# Patient Record
Sex: Female | Born: 1960 | Race: White | Hispanic: No | Smoking: Former smoker
Health system: Southern US, Community
[De-identification: ages and names within clinical notes are randomized; demographics above are authoritative.]

## PROBLEM LIST (undated history)

## (undated) DIAGNOSIS — C801 Malignant (primary) neoplasm, unspecified: Secondary | ICD-10-CM

## (undated) DIAGNOSIS — D701 Agranulocytosis secondary to cancer chemotherapy: Secondary | ICD-10-CM

## (undated) DIAGNOSIS — Z8619 Personal history of other infectious and parasitic diseases: Secondary | ICD-10-CM

## (undated) DIAGNOSIS — B977 Papillomavirus as the cause of diseases classified elsewhere: Secondary | ICD-10-CM

## (undated) DIAGNOSIS — F32A Depression, unspecified: Secondary | ICD-10-CM

## (undated) DIAGNOSIS — F419 Anxiety disorder, unspecified: Secondary | ICD-10-CM

## (undated) DIAGNOSIS — M1990 Unspecified osteoarthritis, unspecified site: Secondary | ICD-10-CM

## (undated) DIAGNOSIS — M419 Scoliosis, unspecified: Secondary | ICD-10-CM

## (undated) DIAGNOSIS — R519 Headache, unspecified: Secondary | ICD-10-CM

## (undated) DIAGNOSIS — R51 Headache: Secondary | ICD-10-CM

## (undated) DIAGNOSIS — T451X5A Adverse effect of antineoplastic and immunosuppressive drugs, initial encounter: Secondary | ICD-10-CM

## (undated) DIAGNOSIS — A419 Sepsis, unspecified organism: Secondary | ICD-10-CM

## (undated) DIAGNOSIS — G2581 Restless legs syndrome: Secondary | ICD-10-CM

## (undated) DIAGNOSIS — C20 Malignant neoplasm of rectum: Secondary | ICD-10-CM

## (undated) DIAGNOSIS — N1330 Unspecified hydronephrosis: Secondary | ICD-10-CM

## (undated) DIAGNOSIS — F329 Major depressive disorder, single episode, unspecified: Secondary | ICD-10-CM

## (undated) DIAGNOSIS — Z87442 Personal history of urinary calculi: Secondary | ICD-10-CM

## (undated) HISTORY — DX: Unspecified osteoarthritis, unspecified site: M19.90

## (undated) HISTORY — PX: OTHER SURGICAL HISTORY: SHX169

## (undated) HISTORY — DX: Major depressive disorder, single episode, unspecified: F32.9

## (undated) HISTORY — DX: Anxiety disorder, unspecified: F41.9

## (undated) HISTORY — DX: Depression, unspecified: F32.A

## (undated) HISTORY — PX: COLONOSCOPY: SHX174

## (undated) HISTORY — PX: SHOULDER ARTHROSCOPY W/ ROTATOR CUFF REPAIR: SHX2400

## (undated) HISTORY — DX: Papillomavirus as the cause of diseases classified elsewhere: B97.7

---

## 1998-07-03 ENCOUNTER — Other Ambulatory Visit: Admission: RE | Admit: 1998-07-03 | Discharge: 1998-07-03 | Payer: Self-pay | Admitting: Obstetrics and Gynecology

## 1999-01-21 ENCOUNTER — Ambulatory Visit (HOSPITAL_COMMUNITY): Admission: RE | Admit: 1999-01-21 | Discharge: 1999-01-21 | Payer: Self-pay | Admitting: Internal Medicine

## 1999-07-10 ENCOUNTER — Other Ambulatory Visit: Admission: RE | Admit: 1999-07-10 | Discharge: 1999-07-10 | Payer: Self-pay | Admitting: Obstetrics and Gynecology

## 1999-07-11 ENCOUNTER — Ambulatory Visit (HOSPITAL_BASED_OUTPATIENT_CLINIC_OR_DEPARTMENT_OTHER): Admission: RE | Admit: 1999-07-11 | Discharge: 1999-07-11 | Payer: Self-pay | Admitting: Surgery

## 2000-11-01 ENCOUNTER — Ambulatory Visit (HOSPITAL_COMMUNITY): Admission: RE | Admit: 2000-11-01 | Discharge: 2000-11-01 | Payer: Self-pay | Admitting: Obstetrics and Gynecology

## 2000-11-01 ENCOUNTER — Encounter: Payer: Self-pay | Admitting: Obstetrics and Gynecology

## 2003-03-13 ENCOUNTER — Other Ambulatory Visit: Admission: RE | Admit: 2003-03-13 | Discharge: 2003-03-13 | Payer: Self-pay | Admitting: Obstetrics and Gynecology

## 2004-01-28 ENCOUNTER — Ambulatory Visit (HOSPITAL_COMMUNITY): Admission: RE | Admit: 2004-01-28 | Discharge: 2004-01-28 | Payer: Self-pay | Admitting: Internal Medicine

## 2004-11-17 ENCOUNTER — Other Ambulatory Visit: Admission: RE | Admit: 2004-11-17 | Discharge: 2004-11-17 | Payer: Self-pay | Admitting: Obstetrics and Gynecology

## 2006-02-03 ENCOUNTER — Encounter: Payer: Self-pay | Admitting: Internal Medicine

## 2012-05-11 ENCOUNTER — Other Ambulatory Visit: Payer: Self-pay | Admitting: Orthopedic Surgery

## 2012-05-11 DIAGNOSIS — M25511 Pain in right shoulder: Secondary | ICD-10-CM

## 2012-11-30 ENCOUNTER — Other Ambulatory Visit: Payer: Self-pay | Admitting: Obstetrics and Gynecology

## 2014-01-19 ENCOUNTER — Other Ambulatory Visit: Payer: Self-pay | Admitting: Obstetrics and Gynecology

## 2015-02-22 ENCOUNTER — Other Ambulatory Visit: Payer: Self-pay | Admitting: Obstetrics and Gynecology

## 2015-02-26 LAB — CYTOLOGY - PAP

## 2018-04-04 HISTORY — PX: COLPOSCOPY: SHX161

## 2018-04-05 ENCOUNTER — Encounter: Payer: Self-pay | Admitting: Gastroenterology

## 2018-05-02 ENCOUNTER — Ambulatory Visit: Payer: Self-pay | Admitting: Physician Assistant

## 2018-05-04 ENCOUNTER — Encounter (INDEPENDENT_AMBULATORY_CARE_PROVIDER_SITE_OTHER): Payer: Self-pay

## 2018-05-04 ENCOUNTER — Encounter: Payer: Self-pay | Admitting: Physician Assistant

## 2018-05-04 ENCOUNTER — Ambulatory Visit (INDEPENDENT_AMBULATORY_CARE_PROVIDER_SITE_OTHER): Payer: 59 | Admitting: Physician Assistant

## 2018-05-04 VITALS — BP 110/60 | HR 78 | Ht 69.0 in | Wt 181.0 lb

## 2018-05-04 DIAGNOSIS — R140 Abdominal distension (gaseous): Secondary | ICD-10-CM

## 2018-05-04 DIAGNOSIS — K625 Hemorrhage of anus and rectum: Secondary | ICD-10-CM | POA: Diagnosis not present

## 2018-05-04 MED ORDER — NA SULFATE-K SULFATE-MG SULF 17.5-3.13-1.6 GM/177ML PO SOLN: 1.0000 | ORAL | 0 refills | Status: DC

## 2018-05-04 NOTE — Progress Notes (Signed)
Chief Complaint: Rectal bleeding  HPI:    Norma Colon is a 57 year old Caucasian female with a past medical history as listed below, who presents to clinic today for complaint of rectal bleeding.    Today, explains that since April every time she has a bowel movement she will see bright red blood "around the stool and on the toilet paper".  Apparently went to see what sounds like a surgeon as referral from her gynecologist and was told it was a thrombosed hemorrhoid that was healing.  They did not give her any medicine.  Patient has continued with bright red blood with bowel movements since that time.  Typically has 2-3 bowel movements a week.  In between can have occasional bloating which feels like "my abdomen is hard".  Denies straining with stool.    Social history positive for recent divorce.       Denies fever, chills, weight loss, change in bowel habits, anorexia, nausea, vomiting, heartburn, reflux, rectal pain or abdominal pain.  Past Medical History:  Diagnosis Date  . Anxiety   . Arthritis   . Depression   . HPV in female     Past Surgical History:  Procedure Laterality Date  . COLPOSCOPY  04/04/2018  . SHOULDER ARTHROSCOPY W/ ROTATOR CUFF REPAIR Right     Current Outpatient Medications  Medication Sig Dispense Refill  . alprazolam (XANAX) 2 MG tablet Take 2 mg by mouth daily.    [name] BIOTIN PO Take 1 capsule by mouth daily.    [name] buPROPion (WELLBUTRIN XL) 300 MG 24 hr tablet Take 300 mg by mouth daily.    . Calcium-Magnesium-Vitamin D 621-30-865 MG-MG-UNIT TB24 Take 1 tablet by mouth daily.    . cyclobenzaprine (FLEXERIL) 10 MG tablet Take 10 mg by mouth 3 (three) times daily as needed for muscle spasms.    [name] FIBER PO Take by mouth 5 (five) times daily.     [name] HYDROcodone-acetaminophen (NORCO) 7.5-325 MG tablet Take 1 tablet by mouth daily as needed.    . magnesium oxide (MAG-OX) 400 MG tablet Take 400 mg by mouth daily.    [name] MAGNESIUM PO Take 1 tablet by mouth daily.    .  Prenatal Vit-Fe Fumarate-FA (PRENATAL VITAMIN PO) Take 1 tablet by mouth daily.    [name] rOPINIRole (REQUIP) 0.25 MG tablet Take 0.25 mg by mouth at bedtime.     No current facility-administered medications for this visit.     Allergies as of 05/04/2018 - Review Complete 05/04/2018  Allergen Reaction Noted  . Codeine Other (See Comments) 05/04/2018    Family History  Problem Relation Age of Onset  . COPD Father        smoker  . Stomach cancer Father        spread to lungs and bones  . Arthritis Mother   . Heart attack Paternal Grandmother   . Heart attack Paternal Grandfather   . Heart attack Maternal Grandmother   . Stroke Maternal Grandmother   . Colon cancer Neg Hx     Social History   Socioeconomic History  . Marital status: Legally Separated    Spouse name: Not on file  . Number of children: 1  . Years of education: Not on file  . Highest education level: Not on file  Occupational History  . Occupation: Land  Social Needs  . Financial resource strain: Not on file  . Food insecurity:    Worry: Not on file    Inability: Not  on file  . Transportation needs:    Medical: Not on file    Non-medical: Not on file  Tobacco Use  . Smoking status: Current Every Day Smoker    Years: 30.00    Types: Cigarettes  . Smokeless tobacco: Never Used  . Tobacco comment: tobacco info given 05/04/18  Substance and Sexual Activity  . Alcohol use: Yes    Comment: social  . Drug use: Never  . Sexual activity: Not on file  Lifestyle  . Physical activity:    Days per week: Not on file    Minutes per session: Not on file  . Stress: Not on file  Relationships  . Social connections:    Talks on phone: Not on file    Gets together: Not on file    Attends religious service: Not on file    Active member of club or organization: Not on file    Attends meetings of clubs or organizations: Not on file    Relationship status: Not on file  . Intimate partner violence:    Fear of  current or ex partner: Not on file    Emotionally abused: Not on file    Physically abused: Not on file    Forced sexual activity: Not on file  Other Topics Concern  . Not on file  Social History Narrative  . Not on file    Review of Systems:    Constitutional: No weight loss, fever or chills  Skin: No rash Cardiovascular: No chest pain Respiratory: No SOB  Gastrointestinal: See HPI and otherwise negative Genitourinary: No dysuria  Neurological: No headache, dizziness or syncope Musculoskeletal: No new muscle or joint pain Hematologic: No bruising Psychiatric: No history of depression or anxiety   Physical Exam:  Vital signs: BP 110/60   Pulse 78   Ht 5\' 9"  (1.753 m)   Wt 181 lb (82.1 kg)   BMI 26.73 kg/m   Constitutional:   Pleasant Caucasian female appears to be in NAD, Well developed, Well nourished, alert and cooperative Head:  Normocephalic and atraumatic. Eyes:   PEERL, EOMI. No icterus. Conjunctiva pink. Ears:  Normal auditory acuity. Neck:  Supple Throat: Oral cavity and pharynx without inflammation, swelling or lesion.  Respiratory: Respirations even and unlabored. Lungs clear to auscultation bilaterally.   No wheezes, crackles, or rhonchi.  Cardiovascular: Normal S1, S2. No MRG. Regular rate and rhythm. No peripheral edema, cyanosis or pallor.  Gastrointestinal:  Soft, nondistended, nontender. No rebound or guarding. Normal bowel sounds. No appreciable masses or hepatomegaly. Rectal: External: Thrombosed external hemorrhoid, noTTP; internal: Fullness, no discharge; Msk:  Symmetrical without gross deformities. Without edema, no deformity or joint abnormality.  Neurologic:  Alert and  oriented x4;  grossly normal neurologically.  Skin:   Dry and intact without significant lesions or rashes. Psychiatric: Demonstrates good judgement and reason without abnormal affect or behaviors.  No recent labs or imaging.  Assessment: 1.  Rectal bleeding: Over the past 3  months, with every bowel movement, bright red on the toilet paper and around the stool, rectal exam shows thrombosed external hemorrhoid that does not appear to have been bleeding; consider  internal hemorrhoids versus polyp versus other 2. Bloating: BM 2-3/wk is normal for patient, no straining- discussed adding in probiotic, already takes fiber supplement  Plan: 1.  Scheduled patient for a diagnostic colonoscopy in the [place] with Dr. Bryan Lemma as he had openings, though patient does not wish to follow-up in [facility] after procedure.  She would  prefer to see be seen in [place] office.  She was assigned to Dr. Fuller Plan going forward.  Did discuss risk, benefits, limitations and alternatives and patient agrees to proceed. 2.  Gave the patient Suprep. 3.  Recommend patient start a probiotic.  She is already taking fiber supplementation every day and drinking a lot of water.  This may help increase frequency of her stools and hopefully help with bloating. 4.  Patient to follow in clinic per recommendations from Dr. Bryan Lemma after time of procedure.  Ellouise Newer, PA-C [facility] 05/04/2018, 3:13 PM  Cc: No ref. provider found

## 2018-05-04 NOTE — Patient Instructions (Signed)

## 2018-05-04 NOTE — Progress Notes (Signed)
Reviewed and agree with management plan.  Malcolm T. Stark, MD FACG 

## 2018-05-05 HISTORY — PX: MRI: SHX5353

## 2018-05-11 ENCOUNTER — Encounter: Payer: Self-pay | Admitting: Gastroenterology

## 2018-05-17 ENCOUNTER — Other Ambulatory Visit: Payer: Self-pay | Admitting: Emergency Medicine

## 2018-05-17 ENCOUNTER — Encounter: Payer: Self-pay | Admitting: Gastroenterology

## 2018-05-17 ENCOUNTER — Ambulatory Visit (AMBULATORY_SURGERY_CENTER): Payer: 59 | Admitting: Gastroenterology

## 2018-05-17 ENCOUNTER — Other Ambulatory Visit: Payer: 59

## 2018-05-17 VITALS — BP 105/68 | HR 81 | Temp 98.7°F | Resp 12 | Ht 69.0 in | Wt 181.0 lb

## 2018-05-17 DIAGNOSIS — C19 Malignant neoplasm of rectosigmoid junction: Secondary | ICD-10-CM

## 2018-05-17 DIAGNOSIS — K625 Hemorrhage of anus and rectum: Secondary | ICD-10-CM | POA: Diagnosis not present

## 2018-05-17 DIAGNOSIS — K6289 Other specified diseases of anus and rectum: Secondary | ICD-10-CM

## 2018-05-17 DIAGNOSIS — C20 Malignant neoplasm of rectum: Secondary | ICD-10-CM

## 2018-05-17 DIAGNOSIS — R140 Abdominal distension (gaseous): Secondary | ICD-10-CM | POA: Diagnosis not present

## 2018-05-17 MED ORDER — SODIUM CHLORIDE 0.9 % IV SOLN: 500.0000 mL | Freq: Once | INTRAVENOUS | Status: DC

## 2018-05-17 MED ORDER — PEG-KCL-NACL-NASULF-NA ASC-C 140 G PO SOLR: 1.0000 | ORAL | 0 refills | Status: DC

## 2018-05-17 NOTE — Progress Notes (Signed)
Pt's states no medical or surgical changes since previsit or office visit. 

## 2018-05-17 NOTE — Patient Instructions (Signed)

## 2018-05-17 NOTE — Progress Notes (Signed)
Called to room to assist during endoscopic procedure.  Patient ID and intended procedure confirmed with present staff. Received instructions for my participation in the procedure from the performing physician.  Called [facility] at [phone] for pick up

## 2018-05-17 NOTE — Op Note (Signed)
[facility] Patient Name: Devon Kingdon Procedure Date: 05/17/2018 3:23 PM MRN: [id number] Endoscopist: Gerrit Heck , MD Age: 57 Referring MD:  Date of Birth: [birth year] Gender: Female Account #: [id number] Procedure:                Colonoscopy Indications:              57 yo female presenting to the GI Clinic with                            hematochezia described as blood coating the outside                            of the stool. This is the patient's first                            colonoscopy. No known family history of colon                            cancer. Medicines:                Monitored Anesthesia Care Procedure:                Pre-Anesthesia Assessment:                           - Prior to the procedure, a History and Physical                            was performed, and patient medications and                            allergies were reviewed. The patient's tolerance of                            previous anesthesia was also reviewed. The risks                            and benefits of the procedure and the sedation                            options and risks were discussed with the patient.                            All questions were answered, and informed consent                            was obtained. Prior Anticoagulants: The patient has                            taken no previous anticoagulant or antiplatelet                            agents. ASA Grade Assessment: II - A patient with  mild systemic disease. After reviewing the risks                            and benefits, the patient was deemed in                            satisfactory condition to undergo the procedure.                           After obtaining informed consent, the colonoscope                            was passed under direct vision. Throughout the                            procedure, the patient's blood pressure, pulse, and           oxygen saturations were monitored continuously. The                            Colonoscope was introduced through the anus and                            advanced to the the terminal ileum. The colonoscopy                            was technically difficult and complex due to poor                            bowel prep. Successful completion of the procedure                            was aided by lavage. The patient tolerated the                            procedure well. The quality of the bowel                            preparation was poor. Scope In: 3:30:54 PM Scope Out: 3:52:00 PM Scope Withdrawal Time: 0 hours 13 minutes 1 second  Total Procedure Duration: 0 hours 21 minutes 6 seconds  Findings:                 The perianal and digital rectal examinations were                            normal.                           A fungating and ulcerated partially obstructing                            large mass was found in the recto-sigmoid colon.  The mass was partially circumferential (involving                            at least two-thirds of the lumen circumference).                            The mass measured seven cm in length, starting at                            10 cm from the anal verge. Oozing was present.                            Several mucosal biopsies were obtained with a cold                            forceps for histology. Estimated blood loss was                            minimal.                           A moderate amount of semi-liquid stool was found in                            the entire colon, interfering with visualization.                            Lavage of the area was performed using copious                            amounts of sterile water, resulting in clearance                            with fair visualization. Cannot rule out the                            presence of polyps or flat lesions in these areas.                            Retroflexion in the rectum was not performed due to                            anatomy (narrowed rectal vault).                           The terminal ileum appeared normal. Complications:            No immediate complications. Estimated Blood Loss:     Estimated blood loss was minimal. Impression:               - Preparation of the colon was poor.                           - Large, malignant, partially obstructing tumor in  the recto-sigmoid colon, located 10-17 cm from the                            anal verge. Biopsied.                           - Stool in the entire examined colon.                           - The examined portion of the ileum was normal. Recommendation:           - Patient has a contact number available for                            emergencies. The signs and symptoms of potential                            delayed complications were discussed with the                            patient. Return to normal activities tomorrow.                            Written discharge instructions were provided to the                            patient.                           - Resume previous diet today.                           - Continue present medications.                           - Await pathology results.                           - Repeat colonoscopy at the next available                            appointment with me because the bowel preparation                            was poor and the need to rule out synchronous                            lesions. Will also plan on tattoo of the lesion at                            that repeat colonsocopy.                           - Perform a CT scan (computed tomography) of chest  with contrast, abdomen with contrast and pelvis                            with contrast at appointment to be scheduled.                           - Refer to a colo-rectal  surgeon at appointment to                            be scheduled.                           - Check CEA today. Gerrit Heck, MD 05/17/2018 4:12:24 PM

## 2018-05-17 NOTE — Progress Notes (Signed)
Spontaneous respirations throughout. VSS. Resting comfortably. To PACU on room air. Report to  RN. 

## 2018-05-18 ENCOUNTER — Telehealth: Payer: Self-pay

## 2018-05-18 LAB — CEA: CEA: 1.3 ng/mL

## 2018-05-18 NOTE — Telephone Encounter (Signed)
  Follow up Call-  Call back number 05/17/2018  Post procedure Call Back phone  # HIPPA  Permission to leave phone message Yes  Some recent data might be hidden     Patient questions:  Do you have a fever, pain , or abdominal swelling? No. Pain Score  0 *  Have you tolerated food without any problems? Yes.    Have you been able to return to your normal activities? Yes.    Do you have any questions about your discharge instructions: Diet   No. Medications  No. Follow up visit  No.  Do you have questions or concerns about your Care? No.  Actions: * If pain score is 4 or above: No action needed, pain <4.

## 2018-05-19 ENCOUNTER — Encounter: Payer: Self-pay | Admitting: Gastroenterology

## 2018-05-19 ENCOUNTER — Telehealth: Payer: Self-pay

## 2018-05-19 ENCOUNTER — Ambulatory Visit (AMBULATORY_SURGERY_CENTER): Payer: 59 | Admitting: Gastroenterology

## 2018-05-19 ENCOUNTER — Other Ambulatory Visit: Payer: Self-pay

## 2018-05-19 ENCOUNTER — Other Ambulatory Visit: Payer: 59

## 2018-05-19 VITALS — BP 117/77 | HR 79 | Temp 98.6°F | Resp 21 | Ht 69.0 in | Wt 181.0 lb

## 2018-05-19 DIAGNOSIS — K621 Rectal polyp: Secondary | ICD-10-CM

## 2018-05-19 DIAGNOSIS — D127 Benign neoplasm of rectosigmoid junction: Secondary | ICD-10-CM

## 2018-05-19 DIAGNOSIS — K629 Disease of anus and rectum, unspecified: Secondary | ICD-10-CM

## 2018-05-19 DIAGNOSIS — C189 Malignant neoplasm of colon, unspecified: Secondary | ICD-10-CM

## 2018-05-19 DIAGNOSIS — K6289 Other specified diseases of anus and rectum: Secondary | ICD-10-CM

## 2018-05-19 DIAGNOSIS — C20 Malignant neoplasm of rectum: Secondary | ICD-10-CM

## 2018-05-19 DIAGNOSIS — D128 Benign neoplasm of rectum: Secondary | ICD-10-CM

## 2018-05-19 MED ORDER — SODIUM CHLORIDE 0.9 % IV SOLN: 500.0000 mL | Freq: Once | INTRAVENOUS | Status: DC

## 2018-05-19 NOTE — Telephone Encounter (Signed)
Chest CT with contrast was overlooked at the ordering of the Abd/Pelvis but was ordered today and will be done at the same time as the other CT's.  Lattie Haw at [facility] will contact the patient tomorrow with update arrival time.

## 2018-05-19 NOTE — Progress Notes (Signed)
Report to PACU, RN, vss, BBS= Clear.  

## 2018-05-19 NOTE — Progress Notes (Signed)
Pt's states no medical or surgical changes since previsit or office visit. 

## 2018-05-19 NOTE — Progress Notes (Signed)
Called to room to assist during endoscopic procedure.  Patient ID and intended procedure confirmed with present staff. Received instructions for my participation in the procedure from the performing physician.  

## 2018-05-19 NOTE — Progress Notes (Signed)
Pam from 3rd floor in to talk to pt about future appts [facility]/Recovery Room

## 2018-05-19 NOTE — Telephone Encounter (Signed)
Per the order of Dr Bryan Lemma a referral to a colo-rectal surgeon at [facility] and a referral to Oncology were both initiated.  I scheduled a follow-up appointment with Dr Bryan Lemma on 06-07-18 at 1:30 pm, patient is aware of date and time.

## 2018-05-19 NOTE — Op Note (Signed)
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 Medicines:                Monitored Anesthesia Care Procedure:                Pre-Anesthesia Assessment:                           - Prior to the procedure, a History and Physical                            was performed, and patient medications and                            allergies were reviewed. The patient's tolerance of                            previous anesthesia was also reviewed. The risks                            and benefits of the procedure and the sedation                            options and risks were discussed with the patient.                            All questions were answered, and informed consent                            was obtained. Prior Anticoagulants: The patient has                            taken no previous anticoagulant or antiplatelet  agents. ASA Grade Assessment: II - A patient with                            mild systemic disease. After reviewing the risks                            and benefits, the patient was deemed in                            satisfactory condition to undergo the procedure.                           After obtaining informed consent, the colonoscope                            was passed under direct vision. Throughout the                             procedure, the patient's blood pressure, pulse, and                            oxygen saturations were monitored continuously. The                            Model CF-HQ190L [id number]) scope was introduced                            through the anus and advanced to the the terminal                            ileum. The colonoscopy was performed without                            difficulty. The patient tolerated the procedure                            well. The quality of the bowel preparation was good. Scope In: 1:59:14 PM Scope Out: 2:19:20 PM Scope Withdrawal Time: 0 hours 15 minutes 22 seconds  Total Procedure Duration: 0 hours 20 minutes 6 seconds  Findings:                 Skin tags were found on perianal exam.                           A fungating and ulcerated partially obstructing                            large mass was found in the recto-sigmoid colon.                            The mass was partially circumferential (involving                            two-thirds of the lumen circumference). The  mass                            measured seven cm in length, located 10-17 cm from                            the anal verge. Oozing was present. The area                            proximal to the mass was tattooed approximately 3                            cm proximal to the lesion, and the area 1-2 cm                            distal to the lesion was also tattooed. A total of                            15 mL of Spot (carbon black) was injected. Tattoo                            placed on mesenteric and antimesenteric sides. As                            this was previously biopsied and confirmation of                            Adenocarcinoma, additional biopsies not obtained                            today.                           A few sessile polyps were found in the rectum                            (benign-appearing lesion). The polyps were 1 to 2                             mm in size. Several of these polyps were removed                            with a cold biopsy forceps for histologic                            representative evaluation. Resection and retrieval                            were complete. Estimated blood loss was minimal.                           The exam was otherwise without abnormality.  The terminal ileum appeared normal. Complications:            No immediate complications. Estimated Blood Loss:     Estimated blood loss was minimal. Impression:               - Perianal skin tags found on perianal exam.                           - Malignant partially obstructing tumor in the                            recto-sigmoid colon. Tattooed.                           - A few benign appearing 1 to 2 mm polyps in the                            rectum, removed with a cold biopsy forceps.                            Resected and retrieved.                           - The examination was otherwise normal.                           - The examined portion of the ileum was normal. Recommendation:           - Patient has a contact number available for                            emergencies. The signs and symptoms of potential                            delayed complications were discussed with the                            patient. Return to normal activities tomorrow.                            Written discharge instructions were provided to the                            patient.                           - Resume previous diet today.                           - Continue present medications.                           - Await pathology results.                           - Refer to a colo-rectal surgeon at appointment to  be scheduled.                           - Refer to an oncologist at appointment to be                            scheduled.                           -  Perform a CT scan (computed tomography) of chest                            with contrast, abdomen with contrast and pelvis                            with contrast as previously scheduled (already                            ordered).                           - Return to GI clinic in 1-2 weeks. Gerrit Heck, MD 05/19/2018 2:32:45 PM

## 2018-05-19 NOTE — Patient Instructions (Signed)
appts to be made by 3rd floor staff. That nurse will be calling you concerning CT scan, etc. 2 bottles of contrast given to patient with blank instructions.   YOU HAD AN ENDOSCOPIC PROCEDURE TODAY AT [facility]:   Refer to the procedure report that was given to you for any specific questions about what was found during the examination.  If the procedure report does not answer your questions, please call your gastroenterologist to clarify.  If you requested that your care partner not be given the details of your procedure findings, then the procedure report has been included in a sealed envelope for you to review at your convenience later.  YOU SHOULD EXPECT: Some feelings of bloating in the abdomen. Passage of more gas than usual.  Walking can help get rid of the air that was put into your GI tract during the procedure and reduce the bloating. If you had a lower endoscopy (such as a colonoscopy or flexible sigmoidoscopy) you may notice spotting of blood in your stool or on the toilet paper. If you underwent a bowel prep for your procedure, you may not have a normal bowel movement for a few days.  Please Note:  You might notice some irritation and congestion in your nose or some drainage.  This is from the oxygen used during your procedure.  There is no need for concern and it should clear up in a day or so.  SYMPTOMS TO REPORT IMMEDIATELY:   Following lower endoscopy (colonoscopy or flexible sigmoidoscopy):  Excessive amounts of blood in the stool  Significant tenderness or worsening of abdominal pains  Swelling of the abdomen that is new, acute  Fever of 100F or higher  For urgent or emergent issues, a gastroenterologist can be reached at any hour by calling [phone].   DIET:  We do recommend a small meal at first, but then you may proceed to your regular diet.  Drink plenty of fluids but you should avoid alcoholic beverages for 24 hours.  ACTIVITY:  You should plan  to take it easy for the rest of today and you should NOT DRIVE or use heavy machinery until tomorrow (because of the sedation medicines used during the test).    FOLLOW UP: Our staff will call the number listed on your records the next business day following your procedure to check on you and address any questions or concerns that you may have regarding the information given to you following your procedure. If we do not reach you, we will leave a message.  However, if you are feeling well and you are not experiencing any problems, there is no need to return our call.  We will assume that you have returned to your regular daily activities without incident.  If any biopsies were taken you will be contacted by phone or by letter within the next 1-3 weeks.  Please call us at [phone] if you have not heard about the biopsies in 3 weeks.    SIGNATURES/CONFIDENTIALITY: You and/or your care partner have signed paperwork which will be entered into your electronic medical record.  These signatures attest to the fact that that the information above on your After Visit Summary has been reviewed and is understood.  Full responsibility of the confidentiality of this discharge information lies with you and/or your care-partner.

## 2018-05-20 ENCOUNTER — Encounter: Payer: Self-pay | Admitting: Oncology

## 2018-05-20 ENCOUNTER — Telehealth: Payer: Self-pay

## 2018-05-20 ENCOUNTER — Telehealth: Payer: Self-pay | Admitting: Oncology

## 2018-05-20 NOTE — Telephone Encounter (Signed)
-----   Message from Arna Snipe, RN sent at 05/20/2018 11:57 AM EDT ----- Regarding: RE: New diagnosis Hi Pam -  I added this patient to the GI Cancer Conference next week and am working on getting her in with Dr. Burr Medico. Did you referrer her to [facility]? I can follow up with them to get her in quickly if that referral has been sent.  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  A referral to [facility] was also started today.  Thanks.

## 2018-05-20 NOTE — Telephone Encounter (Signed)
Yes.  The referral was sent yesterday

## 2018-05-20 NOTE — Telephone Encounter (Deleted)
Please call if any questions or concerns. Follow up Call-  Call back number 05/19/2018 05/17/2018  Post procedure Call Back phone  # [phone] HIPPA  Permission to leave phone message Yes Yes  Some recent data might be hidden     Patient questions:  Do you have a fever, pain , or abdominal swelling? {yes no:314532} Pain Score  {NUMBERS; 0-10:5044} *  Have you tolerated food without any problems? {yes no:314532}  Have you been able to return to your normal activities? {yes no:314532}  Do you have any questions about your discharge instructions: Diet   {yes no:314532} Medications  {yes no:314532} Follow up visit  {yes no:314532}  Do you have questions or concerns about your Care? {yes no:314532}  Actions: * If pain score is 4 or above: {ACTION; [facility] ENDO PAIN >4:21563::"No action needed, pain <4."}

## 2018-05-20 NOTE — Telephone Encounter (Signed)
  Follow up Call-  Call back number 05/19/2018 05/17/2018  Post procedure Call Back phone  # [phone] HIPPA  Permission to leave phone message Yes Yes  Some recent data might be hidden     Patient questions:  Do you have a fever, pain , or abdominal swelling? No. Pain Score  0 *  Have you tolerated food without any problems? Yes.  Have you been able to return to your normal activities? Yes.  Do you have any questions about your discharge instructions: Diet   No. Medications  No. Follow up visit  No.  Do you have questions or concerns about your Care? No.  Actions: * If pain score is 4 or above: No action needed, pain <4.  No problems noted per pt.

## 2018-05-20 NOTE — Telephone Encounter (Signed)
Called [phone] and left a messaged we tried to reach pt for a follow up call. maw

## 2018-05-20 NOTE — Progress Notes (Signed)
Called patient to introduce myself and to explain the role of Gi Navigator. Patient provided with my contact info for questions or concerns. Appointment with Dr. Benay Spice on 05/30/18 @ 2 PM for initial consult. Patient understands to arrive at 1:30 to register. Message sent to Dr. Marcello Moores to facilitate surgical referral.

## 2018-05-20 NOTE — Telephone Encounter (Signed)
New referral received for colon cancer. Pt has been scheduled to see Dr. Benay Spice on 8/26 at [place], GI navigator will notify the pt of the appt date and time. Letter mailed.

## 2018-05-24 ENCOUNTER — Ambulatory Visit
Admission: RE | Admit: 2018-05-24 | Discharge: 2018-05-24 | Disposition: A | Discharge status: Home / Self Care | Payer: 59 | Source: Ambulatory Visit | Attending: Gastroenterology | Admitting: Gastroenterology

## 2018-05-24 ENCOUNTER — Other Ambulatory Visit (HOSPITAL_COMMUNITY): Payer: 59

## 2018-05-24 ENCOUNTER — Other Ambulatory Visit: Payer: 59

## 2018-05-24 ENCOUNTER — Inpatient Hospital Stay: Admission: RE | Admit: 2018-05-24 | Payer: 59 | Source: Ambulatory Visit

## 2018-05-24 ENCOUNTER — Other Ambulatory Visit: Payer: Self-pay | Admitting: Emergency Medicine

## 2018-05-24 ENCOUNTER — Encounter (HOSPITAL_COMMUNITY): Payer: Self-pay

## 2018-05-24 ENCOUNTER — Other Ambulatory Visit: Payer: Self-pay | Admitting: Gastroenterology

## 2018-05-24 ENCOUNTER — Ambulatory Visit (HOSPITAL_COMMUNITY): Admission: RE | Admit: 2018-05-24 | Payer: 59 | Source: Ambulatory Visit

## 2018-05-24 DIAGNOSIS — C20 Malignant neoplasm of rectum: Secondary | ICD-10-CM

## 2018-05-24 DIAGNOSIS — C189 Malignant neoplasm of colon, unspecified: Secondary | ICD-10-CM

## 2018-05-24 DIAGNOSIS — Q991 46, XX true hermaphrodite: Secondary | ICD-10-CM

## 2018-05-24 MED ORDER — IOPAMIDOL (ISOVUE-300) INJECTION 61%
100.0000 mL | Freq: Once | INTRAVENOUS | Status: AC | PRN
  Administered 2018-05-24: 100 mL via INTRAVENOUS

## 2018-05-25 ENCOUNTER — Encounter: Payer: Self-pay | Admitting: Radiation Oncology

## 2018-05-25 DIAGNOSIS — C20 Malignant neoplasm of rectum: Secondary | ICD-10-CM

## 2018-05-25 NOTE — Progress Notes (Signed)
Oncology Nurse Navigator Documentation  Patient called asking for clarification on why she had received a phone call from Dr. Manon Hilding office to set up surgical appointment. Education on who her providers will be  and their roles completed.  Patient has my contact information for further questions. Patient scheduled to see Dr. Benay Spice on 05/30/18.

## 2018-05-25 NOTE — Progress Notes (Signed)
Called and spoke with patient to advise that she will be receiving a call to schedule an appointment with Dr. Eustaquio Boyden.

## 2018-05-26 ENCOUNTER — Telehealth: Payer: Self-pay | Admitting: Gastroenterology

## 2018-05-26 NOTE — Telephone Encounter (Signed)
I called the patient to discuss the CT findings as noted. No evidence of distant mets. The rectosigmoid mass was identified and location/size correlate with recent colonoscopy findings. CT report notes several tiny perirectal lymph nodes, otherwise no distant lymphadenopathy. She is scheduled for an appointment with oncology, colorectal surgery, and radiation oncology next week. Already has a follow-up appointment scheduled with me on September 3. All questions answered. Will defer whether or not to do MRI pelvis to colorectal surgery and oncology.

## 2018-05-26 NOTE — Progress Notes (Signed)
GI Location of Tumor / Histology: Adenocarcinoma of the rectum  Norma Colon presented with rectal bleeding since April.  Bleeding occurs every time she has a bowel movement she will see bright red blood " around the stool and on the toilet paper ".  She was seen by GYN and was told it was a healing thrombosed hemorrhoid.  Patient continued to have bright red blood with bowel movements since then.  Colonoscopy 8/13, 8/15 2019: colon mass  MRI Pelvic 06/01/2018:  CT chest 05/24/2018: 1. Circumferential mass involving the rectosigmoid colon is again noted. This has a length of approximately 9 cm and terminates approximately 7.6 cm from the anal verge. 2. No findings identified to suggest metastatic disease.  CT CAP 05/24/2018: 1. Circumferential mass involving the rectosigmoid colon is again noted. This has a length of approximately 9 cm and terminates approximately 7.6 cm from the anal verge. 2. No findings identified to suggest metastatic disease. 3. Kidney stone identified at the right UPJ is identified without significant hydronephrosis.  Biopsies of Rectum 05/17/2018    Past/Anticipated interventions by GI, if any: PA Lemmon 05/04/2018 - Scheduled patient for a diagnostic colonoscopy in the [place] with Dr. Bryan Lemma as he had openings, though patient does not wish to follow-up in [place] after procedure.  Prefers to be seen in [place]. -Recommended the patient start a probiotic.  Past/Anticipated interventions by surgeon, if any:    Past/Anticipated interventions by medical oncology, if any: Dr. Benay Spice 05/30/2018 2pm -   I recommend neoadjuvant capecitabine/radiation if she is confirmed to have stage II or early stage III disease -  I will coordinate the chemotherapy/radiation plan with Dr. Lisbeth Renshaw. - Her case will be presented at the GI tumor conference on 06/01/2018.   - We will proceed with neoadjuvant therapy if Dr. Marcello Moores is in agreement. - [name] will begin capecitabine and  radiation during the week of 06/13/2018.  She will return for an office and lab visit on 06/22/2018.  Weight changes, if any: No  Bowel/Bladder complaints, if any: Feeling bloated, No issues noted  Nausea / Vomiting, if any: No  Pain issues, if any:  No  Any blood per rectum: none noted    BP 131/80 (BP Location: Left Arm, Patient Position: Sitting)   Pulse 83   Temp 97.8 F (36.6 C) (Oral)   Resp 18   Ht 5\' 9"  (1.753 m)   Wt 180 lb 6.4 oz (81.8 kg)   LMP  (LMP Unknown)   SpO2 100%   BMI 26.64 kg/m    Wt Readings from Last 3 Encounters:  05/31/18 180 lb 6.4 oz (81.8 kg)  05/30/18 180 lb 9.6 oz (81.9 kg)  05/19/18 181 lb (82.1 kg)    SAFETY ISSUES:  Prior radiation? No  Pacemaker/ICD? No  Possible current pregnancy? No  Is the patient on methotrexate? No  Current Complaints/Details:

## 2018-05-30 ENCOUNTER — Telehealth: Payer: Self-pay | Admitting: Oncology

## 2018-05-30 ENCOUNTER — Encounter: Payer: Self-pay | Admitting: Radiation Oncology

## 2018-05-30 ENCOUNTER — Encounter: Payer: Self-pay | Admitting: Oncology

## 2018-05-30 ENCOUNTER — Inpatient Hospital Stay: Payer: 59 | Attending: Oncology | Admitting: Oncology

## 2018-05-30 ENCOUNTER — Other Ambulatory Visit: Payer: Self-pay | Admitting: Radiation Oncology

## 2018-05-30 ENCOUNTER — Telehealth: Payer: Self-pay | Admitting: Pharmacist

## 2018-05-30 VITALS — BP 120/78 | HR 88 | Temp 98.2°F | Resp 18 | Ht 69.0 in | Wt 180.6 lb

## 2018-05-30 DIAGNOSIS — N201 Calculus of ureter: Secondary | ICD-10-CM

## 2018-05-30 DIAGNOSIS — Z720 Tobacco use: Secondary | ICD-10-CM

## 2018-05-30 DIAGNOSIS — G43909 Migraine, unspecified, not intractable, without status migrainosus: Secondary | ICD-10-CM

## 2018-05-30 DIAGNOSIS — C20 Malignant neoplasm of rectum: Secondary | ICD-10-CM

## 2018-05-30 DIAGNOSIS — G2581 Restless legs syndrome: Secondary | ICD-10-CM | POA: Diagnosis not present

## 2018-05-30 MED ORDER — CAPECITABINE 500 MG PO TABS: ORAL_TABLET | ORAL | 0 refills | Status: DC

## 2018-05-30 NOTE — Telephone Encounter (Signed)
Appts scheduled AVS/Calendar printed per 8/26 los °

## 2018-05-30 NOTE — Progress Notes (Signed)
[place] New Patient Consult   Requesting MD: Maicee Ullman 57 y.o.  Nov 07, 1960    Reason for Consult: Rectal cancer   HPI: Norma Colon reports bleeding with bowel movements beginning in February of this year.  She saw her gynecologist and was referred for a rectal evaluation.  She reports being diagnosed with a thrombosed hemorrhoid.  The bleeding persisted and she was referred to gastroenterology 05/04/2018.  She was taken to a colonoscopy by Dr. Bryan Lemma on 05/17/2018.  A fungating partially obstructing mass was found beginning at 10 cm from the anal verge.  Oozing was present.  Biopsies were obtained.  A large amount of stool was found in the colon interfering with visualization.  The biopsy from the rectum ([id number] (revealed invasive moderately differentiated adenocarcinoma. She was taken to a repeat colonoscopy 05/19/2018.  The rectal mass was tattooed.  Sessile polyps measuring 1-2 mm were removed from the rectum.  The pathology on the rectal polyps ([id number] (revealed hyperplastic polyps.  She was referred for CTs of the chest, abdomen, and pelvis on 05/24/2018.  No evidence of metastatic disease to the chest.  No suspicious liver abnormality.  A stone was noted at the right UPJ measuring 5 mm.  Circumferential lesion beginning at the rectosigmoid colon and terminating approximately 7.6 cm from the anal verge.  Several tiny perirectal lymph nodes.  Changes of emphysema in the lungs.  Past Medical History:  Diagnosis Date  . Anxiety   . Arthritis   . Depression   . HPV in female     .  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  Restless legs  Past Surgical History:  Procedure Laterality Date  . COLPOSCOPY  04/04/2018  . SHOULDER ARTHROSCOPY W/ ROTATOR CUFF REPAIR Right     Medications: Reviewed  Allergies:  Allergies  Allergen Reactions  . Codeine Other (See Comments)    Causes migraine    Family history: Her father died of lung cancer at age 66 and was a smoker.   No other family history of cancer  Social History:   He lives alone in [place].  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  She has smoked 1 pack of cigarettes per day for approximately the past 35 years.  She reports social alcohol use.  No transfusion history.  No risk factor for HIV or hepatitis.  ROS:   Positives include: Bleeding with bowel movements, "restless legs "at night  A complete ROS was otherwise negative.  Physical Exam:  Blood pressure 120/78, pulse 88, temperature 98.2 F (36.8 C), temperature source Oral, resp. rate 18, height 5\' 9"  (1.753 m), weight 180 lb 9.6 oz (81.9 kg), SpO2 100 %.  HEENT: Oropharynx without visible mass, neck without mass Lungs: Distant breath sounds with inspiratory bronchial sounds at the left upper posterior chest, no respiratory distress Cardiac: Regular rate and rhythm Abdomen: No hepatosplenomegaly, nontender, no mass  Vascular: No leg edema Lymph nodes: Pea-sized left scalene node.  No cervical, supraclavicular, axillary, or inguinal nodes Neurologic: Alert and oriented, the motor exam appears intact in the upper and lower extremities Skin: No rash, 1 cm area of erythema and induration in the right axilla Musculoskeletal: Spine tenderness   LAB: CEA on 05/17/2018: 1.3    Imaging:  As per HPI, CT images from 05/24/2018 reviewed with Norma Colon   Assessment/Plan:   1. Rectal cancer  Colonoscopy 05/17/2018 revealed a partially obstructing mass beginning at 10 cm from the anal verge, biopsy confirmed  invasive moderately differentiated adenocarcinoma  CTs 05/24/2018-mass beginning at proximal he 7.6 cm from the anal verge, "20 "perirectal lymph nodes, no evidence of metastatic disease 2. Tobacco use 3. 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 Migraine headaches 5. Restless legs   Disposition:   Norma Colon has been diagnosed with rectal cancer.  She appears to have localized disease based on the staging evaluation to date.  She  will be referred for a staging pelvic MRI.  I recommend neoadjuvant capecitabine/radiation if she is confirmed to have stage II or early stage III disease.  Norma Colon is scheduled to see Dr. Lisbeth Renshaw tomorrow.  I will coordinate the chemotherapy/radiation plan with Dr. Lisbeth Renshaw.  Her case will be presented at the GI tumor conference on 06/01/2018.  We will proceed with neoadjuvant therapy if Dr. Marcello Moores is in agreement.  We reviewed potential toxicities associated with capecitabine including the chance for nausea/vomiting, mucositis, diarrhea, and hematologic toxicity.  We discussed the sun sensitivity, rash, hyperpigmentation, and hand/foot syndrome associated with capecitabine.  We discussed the potential for skin toxicity at the perineum with chemotherapy and radiation.  She understands this may be worsened while smoking.  I recommended she discontinue smoking.  Norma Colon will begin capecitabine and radiation during the week of 06/13/2018.  She will return for an office and lab visit on 06/22/2018.    Betsy Coder, MD  05/30/2018, 1:55 PM

## 2018-05-30 NOTE — Telephone Encounter (Signed)
MRI has been authorized per 8/26 staff message

## 2018-05-30 NOTE — Progress Notes (Signed)
Met with Jacobo Forest. Explained role of nurse navigator. Educational information provided on colorectal cancer  [place] resources provided to patient, including SW service information.   SW contact information provided to patient.  Contact names and phone numbers were provided for entire [facility] team.  Teach back method was used. Pamphlet on Pelvic Floor Rehab provided to patient. She will think about the referral and let us know. No barriers to care identified at present time.  Will continue to follow as needed

## 2018-05-30 NOTE — Telephone Encounter (Addendum)
Oral Oncology Pharmacist Encounter  Received new prescription for Xeloda (capecitabine) for the neoadjuvant treatment of stage II or stage III rectal cancer in conjunction with radiation, planned duration 5 1/2 - 6 weeks of therapy.  No recent labs to assess, will be checked tomorrow (05/31/18)  Xeloda will be initiated at 875 mg/m2 BID + radiation  Current medication list in Epic reviewed, no DDIs with Xeloda identified.  Prescription has been e-scribed to the [facility] for benefits analysis and approval.  Oral Oncology Clinic will continue to follow for insurance authorization, copayment issues, initial counseling and start date.  Johny Drilling, PharmD, BCPS, BCOP 05/30/2018 3:37 PM Oral Oncology Clinic [phone]

## 2018-05-31 ENCOUNTER — Inpatient Hospital Stay: Payer: 59

## 2018-05-31 ENCOUNTER — Ambulatory Visit
Admission: RE | Admit: 2018-05-31 | Discharge: 2018-05-31 | Disposition: A | Discharge status: Home / Self Care | Payer: 59 | Source: Ambulatory Visit | Attending: Radiation Oncology | Admitting: Radiation Oncology

## 2018-05-31 ENCOUNTER — Encounter: Payer: Self-pay | Admitting: *Deleted

## 2018-05-31 ENCOUNTER — Encounter: Payer: Self-pay | Admitting: Radiation Oncology

## 2018-05-31 ENCOUNTER — Other Ambulatory Visit: Payer: Self-pay | Admitting: Radiation Oncology

## 2018-05-31 ENCOUNTER — Other Ambulatory Visit: Payer: Self-pay

## 2018-05-31 VITALS — BP 131/80 | HR 83 | Temp 97.8°F | Resp 18 | Ht 69.0 in | Wt 180.4 lb

## 2018-05-31 DIAGNOSIS — Z885 Allergy status to narcotic agent status: Secondary | ICD-10-CM | POA: Insufficient documentation

## 2018-05-31 DIAGNOSIS — Z79899 Other long term (current) drug therapy: Secondary | ICD-10-CM | POA: Insufficient documentation

## 2018-05-31 DIAGNOSIS — C20 Malignant neoplasm of rectum: Secondary | ICD-10-CM

## 2018-05-31 DIAGNOSIS — Z006 Encounter for examination for normal comparison and control in clinical research program: Secondary | ICD-10-CM

## 2018-05-31 DIAGNOSIS — F17210 Nicotine dependence, cigarettes, uncomplicated: Secondary | ICD-10-CM | POA: Insufficient documentation

## 2018-05-31 DIAGNOSIS — M1990 Unspecified osteoarthritis, unspecified site: Secondary | ICD-10-CM | POA: Insufficient documentation

## 2018-05-31 HISTORY — DX: Scoliosis, unspecified: M41.9

## 2018-05-31 LAB — CMP (CANCER CENTER ONLY)
ALT: 17 U/L (ref 0–44)
AST: 17 U/L (ref 15–41)
Albumin: 4.1 g/dL (ref 3.5–5.0)
Alkaline Phosphatase: 114 U/L (ref 38–126)
Anion gap: 6 (ref 5–15)
BUN: 17 mg/dL (ref 6–20)
CO2: 31 mmol/L (ref 22–32)
CREATININE: 0.89 mg/dL (ref 0.44–1.00)
Calcium: 10.4 mg/dL — ABNORMAL HIGH (ref 8.9–10.3)
Chloride: 104 mmol/L (ref 98–111)
Glucose, Bld: 88 mg/dL (ref 70–99)
Potassium: 4.2 mmol/L (ref 3.5–5.1)
SODIUM: 141 mmol/L (ref 135–145)
Total Bilirubin: 0.5 mg/dL (ref 0.3–1.2)
Total Protein: 7.8 g/dL (ref 6.5–8.1)

## 2018-05-31 LAB — CBC WITH DIFFERENTIAL (CANCER CENTER ONLY)
BASOS ABS: 0 10*3/uL (ref 0.0–0.1)
Basophils Relative: 0 %
Eosinophils Absolute: 0.2 10*3/uL (ref 0.0–0.5)
Eosinophils Relative: 3 %
HCT: 41.7 % (ref 34.8–46.6)
HEMOGLOBIN: 14 g/dL (ref 11.6–15.9)
LYMPHS ABS: 2 10*3/uL (ref 0.9–3.3)
LYMPHS PCT: 31 %
MCH: 31.8 pg (ref 25.1–34.0)
MCHC: 33.6 g/dL (ref 31.5–36.0)
MCV: 94.8 fL (ref 79.5–101.0)
Monocytes Absolute: 0.6 10*3/uL (ref 0.1–0.9)
Monocytes Relative: 9 %
Neutro Abs: 3.7 10*3/uL (ref 1.5–6.5)
Neutrophils Relative %: 57 %
Platelet Count: 259 10*3/uL (ref 145–400)
RBC: 4.4 MIL/uL (ref 3.70–5.45)
RDW: 13 % (ref 11.2–14.5)
WBC: 6.5 10*3/uL (ref 3.9–10.3)

## 2018-05-31 LAB — RESEARCH LABS

## 2018-05-31 MED ORDER — CAPECITABINE 500 MG PO TABS: ORAL_TABLET | ORAL | 0 refills | Status: DC

## 2018-05-31 NOTE — Telephone Encounter (Signed)
Oral Oncology Pharmacist Encounter  Patient with extremely high copayment for Xeloda at the [facility]. Oral Oncology Patient Advocate called insurance plan and was instructed patient must fill Xeloda at [facility]. Xeloda prescription has now been e-scribed to [facility]. Patient will be updated about dispensing pharmacy during initial counseling session. I will plan to see patient face-to-face today after lab draw and before radiation oncology consult appointment.  Norma Colon, PharmD, BCPS, BCOP  05/31/2018 9:59 AM Oral Oncology Clinic [phone]

## 2018-05-31 NOTE — Telephone Encounter (Signed)
Oral Chemotherapy Pharmacist Encounter   I spoke with patient in [facility] lobby for overview of: Xeloda.   Counseled patient on administration, dosing, side effects, monitoring, drug-food interactions, safe handling, storage, and disposal.  Patient will take Xeloda 500mg  tablets, 4 tablets (2000mg ) by mouth in AM and 3 tabs (1500mg ) by mouth in PM, within 30 minutes of finishing meals, on days of radiation only.  Xeloda and radiation start date: planned for 06/13/18  Adverse effects of Xeloda include but are not limited to: fatigue, decreased blood counts, GI upset, diarrhea, and hand-foot syndrome.  Patient has anti-emetic on hand and knows to take it if nausea develops.   Patient will obtain anti diarrheal and alert the office of 4 or more loose stools above baseline.   Reviewed with patient importance of keeping a medication schedule and plan for any missed doses.  Ms. Gretzinger voiced understanding and appreciation.   All questions answered.  Medication reconciliation performed and medication/allergy list updated.  Patient informed Xeloda will be filled through [facility] per insurance requirement. Patient informed pharmacy will contact her to report copayment information and schedule shipment of Xeloda. Patient provided contact number for [facility] [phone]) Patient knows to wait until the start date of radiation prior to starting Xeloda tablets.  Patient requests consult with [facility] dietician. MD will be notified.  Patient knows to call the office with questions or concerns. Oral Oncology Clinic will continue to follow.  Johny Drilling, PharmD, BCPS, BCOP  05/31/2018  1:30 PM Oral Oncology Clinic [phone]

## 2018-05-31 NOTE — Progress Notes (Signed)
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   HISTORY OF PRESENT ILLNESS: Norma Colon is a 57 y.o. female seen at the request of Dr. Malachy Mood for a new diagnosis of rectal cancer.  The patient had been experiencing bleeding with bowel movements for several months and was evaluated by GI in July 2019.  She underwent colonoscopy on 05/17/2018 which revealed a fungating partially obstructing mass 10 cm from the anal verge.  Her biopsy that day revealed invasive moderately differentiated adenocarcinoma, and because of poor prep repeated this on 05/19/2018.  Her rectal mass was tattooed, and several sessile polyps measuring 1 to 2 mm were also removed from the rectum these polyps revealed hyperplastic changes.  On 05/24/2018 she had a CT of the chest abdomen and pelvis which did not reveal any disease outside of the rectum which measured 9 cm in length, and 7.6 cm from the anal verge and several tiny perirectal nodes were identified.  Emphysematous changes were noted in the lungs but no evidence of metastatic disease was present.  She is scheduled to undergo MRI of the pelvis tomorrow, and has follow-up with Dr. Bryan Lemma in gastroenterology on 06/07/2018.  She also had a CEA ordered that was 1.3 on 05/17/2017.  She has been evaluated by Dr. Marcello Moores, and comes to discuss options of neoadjuvant treatment if she has stage II or early stage III disease.  Of note her case will be discussed tomorrow in GI oncology conference.   PREVIOUS RADIATION THERAPY: No   PAST MEDICAL HISTORY:  Past Medical History:  Diagnosis Date  . Anxiety   . Arthritis   . Depression   . HPV in female   . Scoliosis        PAST SURGICAL HISTORY: Past  Surgical History:  Procedure Laterality Date  . COLPOSCOPY  04/04/2018  . SHOULDER ARTHROSCOPY W/ ROTATOR CUFF REPAIR Right      FAMILY HISTORY:  Family History  Problem Relation Age of Onset  . COPD Father        smoker  . Stomach cancer Father        spread to lungs and bones  . Arthritis Mother   . Heart attack Paternal Grandmother   . Heart attack Paternal Grandfather   . Heart attack Maternal Grandmother   . Stroke Maternal Grandmother   . Colon cancer Neg Hx      SOCIAL HISTORY:  reports that she has been smoking cigarettes. She has smoked for the past 30.00 years. She has never used smokeless tobacco. She reports that she drinks alcohol. She reports that she does not use drugs. The patient is separated and lives in [place]. She owns a heating and air company.  ALLERGIES: Codeine   MEDICATIONS:  Current Outpatient Medications  Medication Sig Dispense Refill  . alprazolam (XANAX) 2 MG tablet Take 2 mg by mouth daily.    [name] BIOTIN PO Take 1 capsule by mouth daily.    [name] buPROPion (WELLBUTRIN XL) 300 MG 24 hr tablet Take 300 mg by mouth daily.    . Calcium-Magnesium-Vitamin D 263-33-545 MG-MG-UNIT TB24 Take 1 tablet by mouth daily.    . capecitabine (XELODA) 500 MG tablet  Take 4 tablets (2030m) in AM & 3 tabs (15068m in PM, within 30 min of finishing food. Take on radiation days only, M-F 196 tablet 0  . cyclobenzaprine (FLEXERIL) 10 MG tablet Take 10 mg by mouth 3 (three) times daily as needed for muscle spasms.    . Marland KitchenIBER PO Take by mouth 5 (five) times daily.     . Marland KitchenYDROcodone-acetaminophen (NORCO) 10-325 MG tablet Take 1 tablet by mouth every 4 (four) hours as needed.    . lactobacillus acidophilus (BACID) TABS tablet Take 2 tablets by mouth 3 (three) times daily.    . magnesium oxide (MAG-OX) 400 MG tablet Take 400 mg by mouth daily.    . Marland KitchenAGNESIUM PO Take 1 tablet by mouth daily.    . Prenatal Vit-Fe Fumarate-FA (PRENATAL VITAMIN PO) Take 1 tablet by mouth  daily.    . Marland KitchenOPINIRole (REQUIP) 0.25 MG tablet Take 0.25 mg by mouth at bedtime.     No current facility-administered medications for this encounter.      REVIEW OF SYSTEMS: On review of systems, the patient reports that she is doing well overall. She denies any chest pain, shortness of breath, cough, fevers, chills, night sweats, unintended weight changes. She reports bleeding with bowel movements but denies difficulty with constipation, or bladder disturbances, and denies abdominal pain, nausea or vomiting. She denies any new musculoskeletal or joint aches or pains. A complete review of systems is obtained and is otherwise negative.     PHYSICAL EXAM:  Wt Readings from Last 3 Encounters:  05/31/18 180 lb 6.4 oz (81.8 kg)  05/30/18 180 lb 9.6 oz (81.9 kg)  05/19/18 181 lb (82.1 kg)   Temp Readings from Last 3 Encounters:  05/31/18 97.8 F (36.6 C) (Oral)  05/30/18 98.2 F (36.8 C) (Oral)  05/19/18 98.6 F (37 C) (Temporal)   BP Readings from Last 3 Encounters:  05/31/18 131/80  05/30/18 120/78  05/19/18 117/77   Pulse Readings from Last 3 Encounters:  05/31/18 83  05/30/18 88  05/19/18 79   Pain Assessment Pain Score: 0-No pain/10  In general this is a well appearing caucasian female in no acute distress. She's alert and oriented x4 and appropriate throughout the examination. Cardiopulmonary assessment is negative for acute distress and she exhibits normal effort.     ECOG = 1  0 - Asymptomatic (Fully active, able to carry on all predisease activities without restriction)  1 - Symptomatic but completely ambulatory (Restricted in physically strenuous activity but ambulatory and able to carry out work of a light or sedentary nature. For example, light housework, office work)  2 - Symptomatic, <50% in bed during the day (Ambulatory and capable of all self care but unable to carry out any work activities. Up and about more than 50% of waking hours)  3 - Symptomatic,  >50% in bed, but not bedbound (Capable of only limited self-care, confined to bed or chair 50% or more of waking hours)  4 - Bedbound (Completely disabled. Cannot carry on any self-care. Totally confined to bed or chair)  5 - Death   OkEustace PenM, Creech RH, Tormey DC, et al. ([id number] "Toxicity and response criteria of the [facility]". AmSneadncol. 5 (6): 649-55    LABORATORY DATA:  Lab Results  Component Value Date   WBC 6.5 05/31/2018   HGB 14.0 05/31/2018   HCT 41.7 05/31/2018   MCV 94.8 05/31/2018   PLT 259 05/31/2018   Lab Results  Component  Value Date   NA 141 05/31/2018   K 4.2 05/31/2018   CL 104 05/31/2018   CO2 31 05/31/2018   Lab Results  Component Value Date   ALT 17 05/31/2018   AST 17 05/31/2018   ALKPHOS 114 05/31/2018   BILITOT 0.5 05/31/2018      RADIOGRAPHY: Ct Chest W Contrast  Result Date: 05/24/2018 CLINICAL DATA:  Staging colon cancer. EXAM: CT CHEST, ABDOMEN, AND PELVIS WITH CONTRAST TECHNIQUE: Multidetector CT imaging of the chest, abdomen and pelvis was performed following the standard protocol during bolus administration of intravenous contrast. CONTRAST:  121m ISOVUE-300 IOPAMIDOL (ISOVUE-300) INJECTION 61% COMPARISON:  None FINDINGS: CT CHEST FINDINGS Cardiovascular: Normal heart size.  No pericardial effusion. Mediastinum/Nodes: Normal appearance of the thyroid gland. The trachea appears patent and is midline. Normal appearance of the esophagus. No enlarged mediastinal or hilar lymph nodes. Lungs/Pleura: No pleural effusion. Mild changes of emphysema. No suspicious pulmonary nodules or masses. Musculoskeletal: No aggressive lytic or sclerotic bone lesions. CT ABDOMEN PELVIS FINDINGS Hepatobiliary: No suspicious liver abnormality. The gallbladder contains multiple small stones. No wall thickening or biliary dilatation. Pancreas: Unremarkable. No pancreatic ductal dilatation or surrounding inflammatory changes. Spleen: Normal  in size without focal abnormality. Adrenals/Urinary Tract: Normal adrenal glands. There is a stone at the right UPJ measuring 5 mm, image 105/8. No significant right-sided hydronephrosis identified. No left renal calculi. The urinary bladder is unremarkable. Stomach/Bowel: Stomach is normal. The small bowel loops have a normal course and caliber without obstruction. The appendix is visualized and appears normal. No pathologic dilatation of the colon. Circumferential lesion involving the rectosigmoid colon is identified. This can be seen in its entirety on the sagittal images where it measures approximately 9 cm in length and terminates approximately 7.6 cm from the anal verge. Several tiny perirectal lymph nodes are identified, image 100/4. Vascular/Lymphatic: Normal appearance of the abdominal aorta. No enlarged lymph nodes identified within the abdomen or pelvis. No inguinal adenopathy. Reproductive: The uterus is either atrophic or surgically absent. No adnexal mass noted. Other: No free fluid or fluid collections. Musculoskeletal: Spondylosis identified within the lumbar spine. Anterolisthesis of L5 on S1 measures 1.1 cm. IMPRESSION: 1. Circumferential mass involving the rectosigmoid colon is again noted. This has a length of approximately 9 cm and terminates approximately 7.6 cm from the anal verge. 2. No findings identified to suggest metastatic disease. 3. Kidney stone identified at the right UPJ is identified without significant hydronephrosis. 4. Emphysema (ICD10-J43.9). Electronically Signed   By: TKerby MoorsM.D.   On: 05/24/2018 14:28   Ct Abdomen Pelvis W Contrast  Result Date: 05/24/2018 CLINICAL DATA:  Staging colon cancer. EXAM: CT CHEST, ABDOMEN, AND PELVIS WITH CONTRAST TECHNIQUE: Multidetector CT imaging of the chest, abdomen and pelvis was performed following the standard protocol during bolus administration of intravenous contrast. CONTRAST:  1081mISOVUE-300 IOPAMIDOL (ISOVUE-300)  INJECTION 61% COMPARISON:  None FINDINGS: CT CHEST FINDINGS Cardiovascular: Normal heart size.  No pericardial effusion. Mediastinum/Nodes: Normal appearance of the thyroid gland. The trachea appears patent and is midline. Normal appearance of the esophagus. No enlarged mediastinal or hilar lymph nodes. Lungs/Pleura: No pleural effusion. Mild changes of emphysema. No suspicious pulmonary nodules or masses. Musculoskeletal: No aggressive lytic or sclerotic bone lesions. CT ABDOMEN PELVIS FINDINGS Hepatobiliary: No suspicious liver abnormality. The gallbladder contains multiple small stones. No wall thickening or biliary dilatation. Pancreas: Unremarkable. No pancreatic ductal dilatation or surrounding inflammatory changes. Spleen: Normal in size without focal abnormality. Adrenals/Urinary Tract: Normal adrenal glands. There  is a stone at the right UPJ measuring 5 mm, image 105/8. No significant right-sided hydronephrosis identified. No left renal calculi. The urinary bladder is unremarkable. Stomach/Bowel: Stomach is normal. The small bowel loops have a normal course and caliber without obstruction. The appendix is visualized and appears normal. No pathologic dilatation of the colon. Circumferential lesion involving the rectosigmoid colon is identified. This can be seen in its entirety on the sagittal images where it measures approximately 9 cm in length and terminates approximately 7.6 cm from the anal verge. Several tiny perirectal lymph nodes are identified, image 100/4. Vascular/Lymphatic: Normal appearance of the abdominal aorta. No enlarged lymph nodes identified within the abdomen or pelvis. No inguinal adenopathy. Reproductive: The uterus is either atrophic or surgically absent. No adnexal mass noted. Other: No free fluid or fluid collections. Musculoskeletal: Spondylosis identified within the lumbar spine. Anterolisthesis of L5 on S1 measures 1.1 cm. IMPRESSION: 1. Circumferential mass involving the  rectosigmoid colon is again noted. This has a length of approximately 9 cm and terminates approximately 7.6 cm from the anal verge. 2. No findings identified to suggest metastatic disease. 3. Kidney stone identified at the right UPJ is identified without significant hydronephrosis. 4. Emphysema (ICD10-J43.9). Electronically Signed   By: Kerby Moors M.D.   On: 05/24/2018 14:28       IMPRESSION/PLAN: 1. Adenocarcinoma of the rectum.  The patient will be undergoing MRI staging of the pelvis, and we will follow-up with these results when available.  She is due to have this performed on Wednesday, 06/01/2017.  We discussed the findings thus far to date, and her work-up, and anticipate there would be a role for chemoradiation followed by surgical resection.  She is met with Dr. Marcello Moores, and with Dr. Benay Spice.  We will follow-up with the results and discuss her case as well in GI conference.  We reviewed the rationale for chemoradiation, the risks, benefits, short and long-term effects of therapy, and at the end of the discussion the patient is interested in proceeding.  Dr. Lisbeth Renshaw discusses the delivery and logistics of therapy and recommends a course of 5-1/2 weeks with concurrent oral Xeloda.  We anticipate her start date to be 06/13/2018.  She is in agreement with this plan. 2. Possible genetic predisposition to malignancy. The patient is a candidate for genetic testing given her personal and family history. She was offered referral and at this time is not quite ready to proceed with assessment.  We will revisit this at subsequent visits. 3. Nutritional consultation.  The patient desires evaluation with nutrition.  We we will ask for the nutritionists to be involved in her case.  In a visit lasting 60 minutes, greater than 50% of the time was spent face to face discussing her case, and coordinating the patient's care.  The above documentation reflects my direct findings during this shared patient visit. Please  see the separate note by Dr. Lisbeth Renshaw on this date for the remainder of the patient's plan of care.    Carola Rhine, PAC

## 2018-06-01 ENCOUNTER — Telehealth: Payer: Self-pay | Admitting: Pharmacist

## 2018-06-01 ENCOUNTER — Encounter: Payer: Self-pay | Admitting: General Practice

## 2018-06-01 ENCOUNTER — Ambulatory Visit
Admission: RE | Admit: 2018-06-01 | Discharge: 2018-06-01 | Disposition: A | Discharge status: Home / Self Care | Payer: 59 | Source: Ambulatory Visit | Attending: Oncology | Admitting: Oncology

## 2018-06-01 ENCOUNTER — Telehealth: Payer: Self-pay | Admitting: General Practice

## 2018-06-01 ENCOUNTER — Telehealth: Payer: Self-pay | Admitting: Radiation Oncology

## 2018-06-01 DIAGNOSIS — C20 Malignant neoplasm of rectum: Secondary | ICD-10-CM

## 2018-06-01 MED ORDER — GADOBENATE DIMEGLUMINE 529 MG/ML IV SOLN
17.0000 mL | Freq: Once | INTRAVENOUS | Status: AC | PRN
  Administered 2018-06-01: 17 mL via INTRAVENOUS

## 2018-06-01 NOTE — Telephone Encounter (Signed)
Oral Oncology Pharmacist Encounter  Received message from clinical social worker that she had received a call from patient with questions about medical equivalency of branded Xeloda versus the generic version. Patient had previously been provided direct dial to oral oncology clinic.  Per message the dispensing pharmacy ([facility]) would be dispensing the generic version.  Per the FDA generic capecitabine is medically equivalent to branded Xeloda. Insurance may be mandating that the generic version is dispensed. The office has no problems with this.  I called and left voicemail for patient with above information. I also left direct dial to oral oncology clinic if she has any additional questions or concerns that I may be able to help with.  I will be out of the office Thursday and Friday of this week.  This information also left on voicemail for patient.  Johny Drilling, PharmD, BCPS, BCOP  06/01/2018 3:25 PM Oral Oncology Clinic [phone]

## 2018-06-01 NOTE — Telephone Encounter (Signed)
[place] CSW Progress Note  Call from patient, she is concerned about specifics of Xeloda prescription, would like call from pharmacist or nurse navigator.  Staff message sent.  Edwyna Shell, LCSW Clinical Social Worker Phone:  [phone]

## 2018-06-01 NOTE — Progress Notes (Signed)
[facility] Psychosocial Distress Screening Clinical Social Work  Clinical Social Work was referred by distress screening protocol.  The patient scored a 6 on the Psychosocial Distress Thermometer which indicates moderate distress. Clinical Social Worker contacted patient by phone to assess for distress and other psychosocial needs. CSW  Had previously spoke w patient re insurance and referred patient to pharmacy and nurse navigator.  In that call had described resources available to patient through [facility], encouraged patient to call and access as needed.    ONCBCN DISTRESS SCREENING 05/31/2018  Screening Type Initial Screening  Distress experienced in past week (1-10) 6  Practical problem type Insurance;Work/school  Family Problem type Other (comment)  Emotional problem type Depression;Nervousness/Anxiety;Adjusting to illness  Information Concerns Type Lack of info about diagnosis;Lack of info about treatment;Lack of info about maintaining fitness  Physical Problem type Pain;Sleep/insomnia;Constipation/diarrhea;Tingling hands/feet    Clinical Social Worker follow up needed: No.  If yes, follow up plan:  Norma Colon, [name], LCSW Clinical Social Worker Phone:  [phone]

## 2018-06-02 NOTE — Telephone Encounter (Signed)
I am not aware of a difference

## 2018-06-02 NOTE — Telephone Encounter (Signed)
I called the patient and we reviewed her MRI and plans for ChemoRT. She wanted to have Dr. Benay Spice call in her Xeloda as name brand. I will reach out to him about this as well.

## 2018-06-03 ENCOUNTER — Telehealth: Payer: Self-pay

## 2018-06-03 NOTE — Telephone Encounter (Signed)
Oral Oncology Patient Advocate Encounter  [facility] to follow up on Xel[facility] and get copay. It is in process and the copay is $3676.92. There is no assistance available right now so I reached out to [place] to see if they had any other information that could help with this copay. Alyson advised me to run this by Denyse Amass when she gets back from vacation on Tuesday and call and get the patients income and household size just incase a grant opened up that may help commercially insured patients.  I call the patient and informed her of the above, she gave me the information needed and expressed understanding and appreciation.  [place] Patient [facility] Phone [phone] Fax [phone]

## 2018-06-07 ENCOUNTER — Telehealth: Payer: Self-pay

## 2018-06-07 ENCOUNTER — Encounter: Payer: Self-pay | Admitting: Gastroenterology

## 2018-06-07 ENCOUNTER — Ambulatory Visit (INDEPENDENT_AMBULATORY_CARE_PROVIDER_SITE_OTHER): Payer: 59 | Admitting: Gastroenterology

## 2018-06-07 VITALS — BP 122/72 | HR 97 | Ht 69.0 in | Wt 180.5 lb

## 2018-06-07 DIAGNOSIS — C20 Malignant neoplasm of rectum: Secondary | ICD-10-CM

## 2018-06-07 DIAGNOSIS — K921 Melena: Secondary | ICD-10-CM | POA: Diagnosis not present

## 2018-06-07 NOTE — Telephone Encounter (Signed)
Oral Oncology Patient Advocate Encounter  I received notification from [facility] that the Xeloda copay is $3676.92.  There is no assistance available at this time and the patient cannot afford this copay.  I will share this information with the Doctor.  [place] Patient [place] Phone [phone] Fax [phone]

## 2018-06-07 NOTE — Progress Notes (Signed)
P  Chief Complaint:    Rectal Adenocarcinoma follow-up, hematochezia  GI History: Rectal adenocarcinoma.  T3b by pelvic MRI. Diagnosed August 2019.  Staging MRI completed and already referred to oncology, radiation oncology, surgery.   HPI:     Patient is a 57 y.o. female presenting to the Gastroenterology Clinic for follow-up.  She was initially seen by Ellouise Newer in July 2019 for hematochezia and bloating and referred for colonoscopy in August 2019 which was notable for a 7 cm partially obstructing mass in the proximal rectum, located 10 cm from the anal verge.  This was subsequently tattooed on the proximal and distal aspects of the tumor.  Mucosal biopsies confirmed rectal adenocarcinoma and she was referred for oncology, radiation oncology, and colorectal surgery evaluation.  CT chest abdomen pelvis completed and only notable for small perirectal lymph nodes.  Staging MRI completed with noted extension through the muscularis propria, stage T3b, N1 without extra mesorectal lymphadenopathy.  She has since been evaluated by Dr. Ammie Dalton (Heme-Onc), Dr. Lisbeth Renshaw (Rad-Onc), and Dr. Dema Severin (Colorectal surgery) and case presented at tumor board last week, with plan to initiate chemo-radiation therapy next week and surgery in approx 3-4 months. She continues to have hematochezia and some bloating, but otherwise tolerating PO intake, weight stable, and no UGI sxs. She is eager to get started on treatment and o/w w/o any issues or complaints today.   Review of systems:     No chest pain, no SOB, no fevers, no urinary sx   Past Medical History:  Diagnosis Date  . Anxiety   . Arthritis   . Depression   . HPV in female   . Scoliosis     Patient's surgical history, family medical history, social history, medications and allergies were all reviewed in Epic    Current Outpatient Medications  Medication Sig Dispense Refill  . alprazolam (XANAX) 2 MG tablet Take 2 mg by mouth daily.    [name]  BIOTIN PO Take 1 capsule by mouth daily.    [name] buPROPion (WELLBUTRIN XL) 300 MG 24 hr tablet Take 300 mg by mouth daily.    . Calcium-Magnesium-Vitamin D 182-99-371 MG-MG-UNIT TB24 Take 1 tablet by mouth daily.    . capecitabine (XELODA) 500 MG tablet Take 4 tablets (2000mg ) in AM & 3 tabs (1500mg ) in PM, within 30 min of finishing food. Take on radiation days only, M-F 196 tablet 0  . cyclobenzaprine (FLEXERIL) 10 MG tablet Take 10 mg by mouth 3 (three) times daily as needed for muscle spasms.    [name] FIBER PO Take by mouth 5 (five) times daily.     [name] HYDROcodone-acetaminophen (NORCO) 10-325 MG tablet Take 1 tablet by mouth every 4 (four) hours as needed.    . lactobacillus acidophilus (BACID) TABS tablet Take 2 tablets by mouth 3 (three) times daily.    . magnesium oxide (MAG-OX) 400 MG tablet Take 400 mg by mouth daily.    [name] MAGNESIUM PO Take 1 tablet by mouth daily.    . Prenatal Vit-Fe Fumarate-FA (PRENATAL VITAMIN PO) Take 1 tablet by mouth daily.    [name] rOPINIRole (REQUIP) 0.25 MG tablet Take 0.25 mg by mouth at bedtime.     No current facility-administered medications for this visit.     Physical Exam:     BP 122/72   Pulse 97   Ht 5\' 9"  (1.753 m)   Wt 180 lb 8 oz (81.9 kg)   LMP  (LMP Unknown)   BMI 26.66  kg/m   GENERAL:  Pleasant female in NAD PSYCH: : Cooperative, normal affect EENT:  conjunctiva pink, mucous membranes moist, neck supple without masses CARDIAC:  RRR, no murmur heard, no peripheral edema PULM: Normal respiratory effort, lungs CTA bilaterally, no wheezing ABDOMEN:  Nondistended, soft, nontender. No obvious masses, no hepatomegaly,  normal bowel sounds SKIN:  turgor, no lesions seen Musculoskeletal:  Normal muscle tone, normal strength NEURO: Alert and oriented x 3, no focal neurologic deficits   IMPRESSION and PLAN:    #1. Rectal Adenocarcinoma - Started neoadjuvant chemo-radiation next week with plan for surgery in 3-4 months - Can f/u with me after  completion of chemo-XRT and surgery. Otherwise, to f/u with Oncology, Rad-Onc, and Colorectal Surgery as scheduled     #2.  Hematochezia: - Secondary to tumor and tx as outlined above. No anemia on recent labs      Lavena Bullion ,DO, FACG 06/07/2018, 1:46 PM

## 2018-06-07 NOTE — Patient Instructions (Signed)
If you are age 55 or older, your body mass index should be between 23-30. Your Body mass index is 26.66 kg/m. If this is out of the aforementioned range listed, please consider follow up with your Primary Care Provider.  If you are age 57 or younger, your body mass index should be between 19-25. Your Body mass index is 26.66 kg/m. If this is out of the aformentioned range listed, please consider follow up with your Primary Care Provider.   Please call and schedule a Follow-Up appointment with [place] after surgery.  It was a pleasure to see you today!  Vito Cirigliano, D.O.

## 2018-06-07 NOTE — Telephone Encounter (Signed)
Spoke with pt regarding an appt tomorrow with Dr. Benay Spice at 0830 to discuss alternatives for Xeloda due to expensive copay. Pt voiced understanding and agreeable.

## 2018-06-08 ENCOUNTER — Ambulatory Visit
Admission: RE | Admit: 2018-06-08 | Discharge: 2018-06-08 | Disposition: A | Discharge status: Home / Self Care | Payer: 59 | Source: Ambulatory Visit | Attending: Radiation Oncology | Admitting: Radiation Oncology

## 2018-06-08 ENCOUNTER — Encounter: Payer: Self-pay | Admitting: Pharmacist

## 2018-06-08 ENCOUNTER — Telehealth: Payer: Self-pay | Admitting: Oncology

## 2018-06-08 ENCOUNTER — Ambulatory Visit: Payer: Self-pay | Admitting: Gastroenterology

## 2018-06-08 ENCOUNTER — Inpatient Hospital Stay: Payer: 59 | Attending: Oncology | Admitting: Oncology

## 2018-06-08 VITALS — BP 123/63 | HR 92 | Temp 98.6°F | Resp 18 | Ht 69.0 in | Wt 182.6 lb

## 2018-06-08 DIAGNOSIS — Z510 Encounter for antineoplastic radiation therapy: Secondary | ICD-10-CM | POA: Diagnosis not present

## 2018-06-08 DIAGNOSIS — C20 Malignant neoplasm of rectum: Secondary | ICD-10-CM | POA: Insufficient documentation

## 2018-06-08 DIAGNOSIS — Z720 Tobacco use: Secondary | ICD-10-CM | POA: Diagnosis not present

## 2018-06-08 NOTE — Telephone Encounter (Signed)
No 9/4 los. °

## 2018-06-08 NOTE — Progress Notes (Signed)
  Norma Colon OFFICE PROGRESS NOTE   Diagnosis: Rectal cancer  INTERVAL HISTORY:   Norma Colon returns for further discussion.  We reviewed the pelvic MRI findings which confirmed clinical stage III disease.  She has a high insurance co-pay for the capecitabine.   Objective:  Physical examination-not performed today  Lab Results:  Lab Results  Component Value Date   WBC 6.5 05/31/2018   HGB 14.0 05/31/2018   HCT 41.7 05/31/2018   MCV 94.8 05/31/2018   PLT 259 05/31/2018   NEUTROABS 3.7 05/31/2018    CMP  Lab Results  Component Value Date   NA 141 05/31/2018   K 4.2 05/31/2018   CL 104 05/31/2018   CO2 31 05/31/2018   GLUCOSE 88 05/31/2018   BUN 17 05/31/2018   CREATININE 0.89 05/31/2018   CALCIUM 10.4 (H) 05/31/2018   PROT 7.8 05/31/2018   ALBUMIN 4.1 05/31/2018   AST 17 05/31/2018   ALT 17 05/31/2018   ALKPHOS 114 05/31/2018   BILITOT 0.5 05/31/2018   GFRNONAA >60 05/31/2018   GFRAA >60 05/31/2018     Medications: I have reviewed the patient's current medications.   Assessment/Plan: 1. Rectal cancer ? 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 CTs 05/24/2018-mass beginning at proximal he 7.6 cm from the anal verge, "20 "perirectal lymph nodes, no evidence of metastatic disease ? Pelvic MRI 06/01/2018- tumor measured at 10.8 cm from the anal sphincter, T3b, N1-2 left perirectal lymph nodes, each measuring 5 mm 2. Tobacco use 3. 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 Migraine headaches 5. Restless legs   Disposition: Norma Colon is been diagnosed with rectal cancer.  We reviewed the pelvic MRI findings.  The plan is to initiate neoadjuvant therapy 06/13/2018.  She has a very high insurance co-pay for capecitabine.  We discussed infusional 5-fluorouracil.  She does not wish to receive the infusional chemotherapy.  She plans to  proceed with capecitabine and radiation. She will return for an office visit on 06/22/2018.   Betsy Coder, MD  06/08/2018  9:23 AM

## 2018-06-08 NOTE — Progress Notes (Signed)
Oral Oncology Pharmacist Encounter  Spoke with patient and husband in exam room about Xeloda (capecitabine) acquisition. Per previous discussions, insurance would not pay any portion of Xeloda cost at the [facility] as they are out of network for patient's prescription insurance coverage. Patient was quoted a high co-pay when Xeloda prescription was being filled through [facility] even though this is the preferred dispensing pharmacy for specialty medications for patient's prescription insurance coverage. Discussed with patient that she needed to call [facility] back in order to schedule shipment of her Xeloda. Copayment reported by [facility] when she spoke with them last week is the copayment that is due from her to the pharmacy in order to receive her medicine. All questions answered. Patient expressed understanding.  Johny Drilling, PharmD, BCPS, BCOP  06/08/2018 10:22 AM Oral Oncology Clinic [phone]

## 2018-06-09 ENCOUNTER — Telehealth: Payer: Self-pay | Admitting: Pharmacist

## 2018-06-09 DIAGNOSIS — Z510 Encounter for antineoplastic radiation therapy: Secondary | ICD-10-CM | POA: Diagnosis not present

## 2018-06-09 DIAGNOSIS — C20 Malignant neoplasm of rectum: Secondary | ICD-10-CM

## 2018-06-09 MED ORDER — CAPECITABINE 500 MG PO TABS: ORAL_TABLET | ORAL | 0 refills | Status: DC

## 2018-06-09 NOTE — Telephone Encounter (Signed)
Oral Oncology Pharmacist Encounter  I [facility] to confirm receipt of capecitabine 500 mg tablet prescription. I was informed that prescription had been transferred to [facility] store as they had some capecitabine in stock, and it would be Monday (06/13/2018) before the [facility] location would get a shipment of capecitabine.  Called and spoke with patient. She states she had already heard from that [facility], but had not gone to pick up her capecitabine fill yet. She also stated that she was informed she would only be able to pick up a partial fill today and would have to return for the remaining fill as they would have to order it as well.  I called Norma Colon at [facility]. to confirm above information. I was informed that they have prepared a 3-day supply of capecitabine 500 mg tablets for patient and she is able to come pick them up at any time. Remaining tablets of capecitabine will be available after 2 PM tomorrow (06/10/2018).  I provided pharmacy technician billing information for [facility] coupon. They placed this billing information and to patient's account, and were able to process the claim. Pharmacy technician informed me that patient would not be charged co-pay for the first 3-day supply and would be charged the entire co-pay of approximately $503 when she is able to pick up the remainder of her fill.  I called patient back and relayed above information. I also relayed directions to patient of the dispensing pharmacy. Patient expressed understanding and appreciation. She will call me back in the office if she is unable to pick up her capecitabine and or has any other issues with medication acquisition.  Norma Colon, PharmD, BCPS, BCOP  06/09/2018 2:01 PM Oral Oncology Clinic [phone]

## 2018-06-09 NOTE — Progress Notes (Signed)
  Radiation Oncology         [phone] ________________________________  Name: Lena Gores MRN: [id number]  Date: 06/08/2018  DOB: 1960/10/29   SIMULATION AND TREATMENT PLANNING NOTE  DIAGNOSIS:     ICD-10-CM   1. 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 The patient was placed in a supine position. A customized vac-lock bag was constructed to aid in patient immobilization on. This complex treatment device will be used on a daily basis during the treatment. In this fashion a CT scan was obtained through the pelvic region and the isocenter was placed near midline within the pelvis. Surface markings were placed.  The patient's imaging was loaded into the radiation treatment planning system. The patient will initially be planned to receive a course of radiation to a dose of 45 Gy. This will be accomplished in 25 fractions at 1.8 gray per fraction. This initial treatment will correspond to a 3-D conformal technique. The target has been contoured in addition to the rectum, bladder and femoral heads. Dose volume histograms of each of these structures have been requested and these will be carefully reviewed as part of the 3-D conformal treatment planning process. To accomplish this initial treatment, 4 customized blocks have been designed for this purpose. Each of these 4 complex treatment devices will be used on a daily basis during the initial course of the treatment. It is anticipated that the patient will then receive a boost for an additional 5.4 Gy. The anticipated total dose therefore will be 50.4 Gy.    Special treatment procedure The patient will receive chemotherapy during the course of radiation treatment. The patient may experience increased or overlapping toxicity due to this combined-modality approach and the patient will be monitored for such problems. This may include  extra lab work as necessary. This therefore constitutes a special treatment procedure.    ________________________________  Jodelle Gross, MD, PhD

## 2018-06-09 NOTE — Progress Notes (Signed)
  Radiation Oncology         [phone]) [phone] ________________________________  Name: Norma Colon MRN: [id number]  Date: 06/08/2018  DOB: 01-Jul-1961  Optical Surface Tracking Plan:  Since intensity modulated radiotherapy (IMRT) and 3D conformal radiation treatment methods are predicated on accurate and precise positioning for treatment, intrafraction motion monitoring is medically necessary to ensure accurate and safe treatment delivery.  The ability to quantify intrafraction motion without excessive ionizing radiation dose can only be performed with optical surface tracking. Accordingly, surface imaging offers the opportunity to obtain 3D measurements of patient position throughout IMRT and 3D treatments without excessive radiation exposure.  I am ordering optical surface tracking for this patient's upcoming course of radiotherapy. ________________________________  Kyung Rudd, MD 06/09/2018 8:41 AM    Reference:   Particia Jasper, et al. Surface imaging-based analysis of intrafraction motion for breast radiotherapy patients.Journal of [place], n. 6, nov. 2014. ISSN [id number].   Available at: <http://www.jacmp.org/index.php/jacmp/article/view/4957>.

## 2018-06-09 NOTE — Telephone Encounter (Signed)
Oral Oncology Pharmacist Encounter  Received call from patient with information that she had contacted her insurance company about her prohibitively expensive copayment for Xeloda (capecitabine) to be used along with radiation treatments. They have provided her with [facility] discount drug coupons in order to receive her generic capecitabine 500 mg tablets at a drastically reduced price.  I called [facility] and discussed coupon processing with them. New coupon for correct number of tablets produced by [facility] and emailed to patient.  Prescription for capecitabine 500 mg tablets has been E scribed to [facility] as [facility] is the mandated dispensing pharmacy for this coupon.  I plan to call Gerilyn Stargell in a couple hours to ensure that they have received the prescription and get information on when prescription would be ready for dispensing to patient.  I called and spoke with patient about above information. I will follow-up with patient for medication acquisition once I have spoken to [facility]. Patient expressed understanding and appreciation.  Johny Drilling, PharmD, BCPS, BCOP  06/09/2018 10:25 AM Oral Oncology Clinic [phone]

## 2018-06-13 ENCOUNTER — Ambulatory Visit
Admission: RE | Admit: 2018-06-13 | Discharge: 2018-06-13 | Disposition: A | Discharge status: Home / Self Care | Payer: 59 | Source: Ambulatory Visit | Attending: Radiation Oncology | Admitting: Radiation Oncology

## 2018-06-13 DIAGNOSIS — Z510 Encounter for antineoplastic radiation therapy: Secondary | ICD-10-CM | POA: Diagnosis not present

## 2018-06-13 NOTE — Telephone Encounter (Signed)
Oral Oncology Patient Advocate Encounter  Confirmed with patient that Xeloda was picked up at [facility] today 06/13/18  [place] [facility] [place] Phone [phone] Fax [phone]

## 2018-06-14 ENCOUNTER — Ambulatory Visit
Admission: RE | Admit: 2018-06-14 | Discharge: 2018-06-14 | Disposition: A | Discharge status: Home / Self Care | Payer: 59 | Source: Ambulatory Visit | Attending: Radiation Oncology | Admitting: Radiation Oncology

## 2018-06-14 DIAGNOSIS — Z510 Encounter for antineoplastic radiation therapy: Secondary | ICD-10-CM | POA: Diagnosis not present

## 2018-06-14 NOTE — Progress Notes (Signed)
Pt here for patient teaching.  Pt given Radiation and You booklet.  Reviewed areas of pertinence such as diarrhea, fatigue, hair loss, sexual and fertility changes, skin changes and urinary and bladder changes . Pt able to give teach back of to pat skin, have Imodium on hand and sitz bath,avoid applying anything to skin within 4 hours of treatment. Pt demonstrated understanding and verbalizes understanding of information given and will contact nursing with any questions or concerns.     Gloriajean Dell. Leonie Green, BSN

## 2018-06-15 ENCOUNTER — Ambulatory Visit
Admission: RE | Admit: 2018-06-15 | Discharge: 2018-06-15 | Disposition: A | Discharge status: Home / Self Care | Payer: 59 | Source: Ambulatory Visit | Attending: Radiation Oncology | Admitting: Radiation Oncology

## 2018-06-15 DIAGNOSIS — Z510 Encounter for antineoplastic radiation therapy: Secondary | ICD-10-CM | POA: Diagnosis not present

## 2018-06-16 ENCOUNTER — Ambulatory Visit
Admission: RE | Admit: 2018-06-16 | Discharge: 2018-06-16 | Disposition: A | Discharge status: Home / Self Care | Payer: 59 | Source: Ambulatory Visit | Attending: Radiation Oncology | Admitting: Radiation Oncology

## 2018-06-16 DIAGNOSIS — Z510 Encounter for antineoplastic radiation therapy: Secondary | ICD-10-CM | POA: Diagnosis not present

## 2018-06-17 ENCOUNTER — Ambulatory Visit
Admission: RE | Admit: 2018-06-17 | Discharge: 2018-06-17 | Disposition: A | Discharge status: Home / Self Care | Payer: 59 | Source: Ambulatory Visit | Attending: Radiation Oncology | Admitting: Radiation Oncology

## 2018-06-17 DIAGNOSIS — Z510 Encounter for antineoplastic radiation therapy: Secondary | ICD-10-CM | POA: Diagnosis not present

## 2018-06-20 ENCOUNTER — Ambulatory Visit
Admission: RE | Admit: 2018-06-20 | Discharge: 2018-06-20 | Disposition: A | Discharge status: Home / Self Care | Payer: 59 | Source: Ambulatory Visit | Attending: Radiation Oncology | Admitting: Radiation Oncology

## 2018-06-20 DIAGNOSIS — Z510 Encounter for antineoplastic radiation therapy: Secondary | ICD-10-CM | POA: Diagnosis not present

## 2018-06-21 ENCOUNTER — Ambulatory Visit
Admission: RE | Admit: 2018-06-21 | Discharge: 2018-06-21 | Disposition: A | Discharge status: Home / Self Care | Payer: 59 | Source: Ambulatory Visit | Attending: Radiation Oncology | Admitting: Radiation Oncology

## 2018-06-21 DIAGNOSIS — Z510 Encounter for antineoplastic radiation therapy: Secondary | ICD-10-CM | POA: Diagnosis not present

## 2018-06-22 ENCOUNTER — Telehealth: Payer: Self-pay | Admitting: Nurse Practitioner

## 2018-06-22 ENCOUNTER — Ambulatory Visit
Admission: RE | Admit: 2018-06-22 | Discharge: 2018-06-22 | Disposition: A | Discharge status: Home / Self Care | Payer: 59 | Source: Ambulatory Visit | Attending: Radiation Oncology | Admitting: Radiation Oncology

## 2018-06-22 ENCOUNTER — Inpatient Hospital Stay (HOSPITAL_BASED_OUTPATIENT_CLINIC_OR_DEPARTMENT_OTHER): Payer: 59 | Admitting: Nurse Practitioner

## 2018-06-22 ENCOUNTER — Inpatient Hospital Stay: Payer: 59 | Admitting: Nutrition

## 2018-06-22 ENCOUNTER — Inpatient Hospital Stay: Payer: 59

## 2018-06-22 ENCOUNTER — Encounter: Payer: Self-pay | Admitting: Nurse Practitioner

## 2018-06-22 VITALS — BP 123/81 | HR 94 | Temp 98.3°F | Resp 18 | Ht 69.0 in | Wt 183.7 lb

## 2018-06-22 DIAGNOSIS — C20 Malignant neoplasm of rectum: Secondary | ICD-10-CM

## 2018-06-22 DIAGNOSIS — Z510 Encounter for antineoplastic radiation therapy: Secondary | ICD-10-CM | POA: Diagnosis not present

## 2018-06-22 DIAGNOSIS — Z720 Tobacco use: Secondary | ICD-10-CM

## 2018-06-22 LAB — CBC WITH DIFFERENTIAL (CANCER CENTER ONLY)
Basophils Absolute: 0 10*3/uL (ref 0.0–0.1)
Basophils Relative: 0 %
EOS ABS: 0.2 10*3/uL (ref 0.0–0.5)
Eosinophils Relative: 5 %
HCT: 39.7 % (ref 34.8–46.6)
Hemoglobin: 13.2 g/dL (ref 11.6–15.9)
Lymphocytes Relative: 22 %
Lymphs Abs: 0.9 10*3/uL (ref 0.9–3.3)
MCH: 31.7 pg (ref 25.1–34.0)
MCHC: 33.2 g/dL (ref 31.5–36.0)
MCV: 95.2 fL (ref 79.5–101.0)
MONO ABS: 0.4 10*3/uL (ref 0.1–0.9)
Monocytes Relative: 10 %
Neutro Abs: 2.7 10*3/uL (ref 1.5–6.5)
Neutrophils Relative %: 63 %
PLATELETS: 207 10*3/uL (ref 145–400)
RBC: 4.17 MIL/uL (ref 3.70–5.45)
RDW: 12.9 % (ref 11.2–14.5)
WBC Count: 4.2 10*3/uL (ref 3.9–10.3)

## 2018-06-22 LAB — CMP (CANCER CENTER ONLY)
ALT: 20 U/L (ref 0–44)
AST: 22 U/L (ref 15–41)
Albumin: 3.9 g/dL (ref 3.5–5.0)
Alkaline Phosphatase: 102 U/L (ref 38–126)
Anion gap: 7 (ref 5–15)
BUN: 21 mg/dL — AB (ref 6–20)
CHLORIDE: 104 mmol/L (ref 98–111)
CO2: 32 mmol/L (ref 22–32)
Calcium: 10.5 mg/dL — ABNORMAL HIGH (ref 8.9–10.3)
Creatinine: 1.06 mg/dL — ABNORMAL HIGH (ref 0.44–1.00)
GFR, Estimated: 58 mL/min — ABNORMAL LOW (ref >60)
Glucose, Bld: 96 mg/dL (ref 70–99)
POTASSIUM: 4 mmol/L (ref 3.5–5.1)
SODIUM: 143 mmol/L (ref 135–145)
Total Bilirubin: 0.7 mg/dL (ref 0.3–1.2)
Total Protein: 7.3 g/dL (ref 6.5–8.1)

## 2018-06-22 NOTE — Progress Notes (Signed)
57 year old female diagnosed with rectal cancer taking capecitabine and radiation therapy.  Past medical history includes scoliosis, depression, and anxiety.  Medications include Xanax, Wellbutrin, calcium, magnesium, vitamin D, and magnesium ox.  Labs were reviewed.  Height: 5 feet 9 inches. Weight: 183.7 pounds. Usual body weight: 140 pounds about 1 year ago per patient. BMI: 27.13.  Patient reports she eats whatever she likes to eat. She is not concerned with recent weight gain.   She has had 8 radiation treatments so far and denies nutrition impact symptoms.  Nutrition diagnosis:  Food and nutrition related knowledge deficit related to rectal cancer and associated treatments as evidenced by no prior need for nutrition related information.  Intervention: Educated patient on strategies for eating smaller more frequent meals and snacks with adequate protein. Provided fact sheet. Educated patient on low fiber diet if she develops diarrhea and provided fact sheet. Questions were answered.  Teach back method used.  Contact information provided.  Monitoring, evaluation, goals: Patient will tolerate adequate calories and protein to minimize weight gain and to improve healing.  No follow-up required.  **Disclaimer: This note was dictated with voice recognition software. Similar sounding words can inadvertently be transcribed and this note may contain transcription errors which may not have been corrected upon publication of note.**

## 2018-06-22 NOTE — Telephone Encounter (Signed)
Scheduled appt per 9/18 los - left message for patient with appt date and time.

## 2018-06-22 NOTE — Progress Notes (Signed)
  [place] OFFICE PROGRESS NOTE   Diagnosis: Rectal cancer  INTERVAL HISTORY:   Ms. Gariepy returns as scheduled.  She began radiation and Xeloda 06/13/2018.  She denies nausea/vomiting.  No mouth sores.  No diarrhea.  No hand or foot pain or redness.  She has recently noted less rectal bleeding.  No rectal pain.  She has a good appetite.  Objective:  Vital signs in last 24 hours:  Blood pressure 123/81, pulse 94, temperature 98.3 F (36.8 C), temperature source Oral, resp. rate 18, height 5\' 9"  (1.753 m), weight 183 lb 11.2 oz (83.3 kg), SpO2 100 %.    HEENT: No thrush or ulcers. Resp: Lungs clear bilaterally. Cardio: Regular rate and rhythm. GI: Abdomen soft and nontender.  No hepatomegaly. Vascular: No leg edema.  Skin: Palms without erythema.   Lab Results:  Lab Results  Component Value Date   WBC 6.5 05/31/2018   HGB 14.0 05/31/2018   HCT 41.7 05/31/2018   MCV 94.8 05/31/2018   PLT 259 05/31/2018   NEUTROABS 3.7 05/31/2018    Imaging:  No results found.  Medications: I have reviewed the patient's current medications.  Assessment/Plan: 1. Rectal cancer ? 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 CTs 05/24/2018-mass beginning at proximal he 7.6 cm from the anal verge, "20 "perirectal lymph nodes, no evidence of metastatic disease ? Pelvic MRI 06/01/2018- tumor measured at 10.8 cm from the anal sphincter, T3b, N1-2 left perirectal lymph nodes, each measuring 5 mm ? Radiation and Xeloda initiated 06/13/2018 2. Tobacco use 3. 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 Migraine headaches 5. Restless legs  Disposition: Ms. Diemer appears stable.  She began radiation and Xeloda on 06/13/2018.  Thus far she is tolerating the Xeloda well.  She will return to the lab today for a CBC and chemistry panel.  She will return for lab and follow-up  in 2 weeks.  She will contact the office in the interim with any problems.    Liesel Peckenpaugh ANP/GNP-BC   06/22/2018  3:11 PM

## 2018-06-23 ENCOUNTER — Ambulatory Visit
Admission: RE | Admit: 2018-06-23 | Discharge: 2018-06-23 | Disposition: A | Discharge status: Home / Self Care | Payer: 59 | Source: Ambulatory Visit | Attending: Radiation Oncology | Admitting: Radiation Oncology

## 2018-06-23 DIAGNOSIS — Z510 Encounter for antineoplastic radiation therapy: Secondary | ICD-10-CM | POA: Diagnosis not present

## 2018-06-24 ENCOUNTER — Encounter: Payer: Self-pay | Admitting: *Deleted

## 2018-06-24 ENCOUNTER — Ambulatory Visit
Admission: RE | Admit: 2018-06-24 | Discharge: 2018-06-24 | Disposition: A | Discharge status: Home / Self Care | Payer: 59 | Source: Ambulatory Visit | Attending: Radiation Oncology | Admitting: Radiation Oncology

## 2018-06-24 DIAGNOSIS — Z510 Encounter for antineoplastic radiation therapy: Secondary | ICD-10-CM | POA: Diagnosis not present

## 2018-06-27 ENCOUNTER — Ambulatory Visit
Admission: RE | Admit: 2018-06-27 | Discharge: 2018-06-27 | Disposition: A | Discharge status: Home / Self Care | Payer: 59 | Source: Ambulatory Visit | Attending: Radiation Oncology | Admitting: Radiation Oncology

## 2018-06-27 DIAGNOSIS — Z510 Encounter for antineoplastic radiation therapy: Secondary | ICD-10-CM | POA: Diagnosis not present

## 2018-06-28 ENCOUNTER — Ambulatory Visit
Admission: RE | Admit: 2018-06-28 | Discharge: 2018-06-28 | Disposition: A | Discharge status: Home / Self Care | Payer: 59 | Source: Ambulatory Visit | Attending: Radiation Oncology | Admitting: Radiation Oncology

## 2018-06-28 DIAGNOSIS — Z510 Encounter for antineoplastic radiation therapy: Secondary | ICD-10-CM | POA: Diagnosis not present

## 2018-06-29 ENCOUNTER — Encounter: Payer: Self-pay | Admitting: *Deleted

## 2018-06-29 ENCOUNTER — Ambulatory Visit
Admission: RE | Admit: 2018-06-29 | Discharge: 2018-06-29 | Disposition: A | Discharge status: Home / Self Care | Payer: 59 | Source: Ambulatory Visit | Attending: Radiation Oncology | Admitting: Radiation Oncology

## 2018-06-29 DIAGNOSIS — Z510 Encounter for antineoplastic radiation therapy: Secondary | ICD-10-CM | POA: Diagnosis not present

## 2018-06-30 ENCOUNTER — Ambulatory Visit
Admission: RE | Admit: 2018-06-30 | Discharge: 2018-06-30 | Disposition: A | Discharge status: Home / Self Care | Payer: 59 | Source: Ambulatory Visit | Attending: Radiation Oncology | Admitting: Radiation Oncology

## 2018-06-30 DIAGNOSIS — Z510 Encounter for antineoplastic radiation therapy: Secondary | ICD-10-CM | POA: Diagnosis not present

## 2018-07-01 ENCOUNTER — Ambulatory Visit
Admission: RE | Admit: 2018-07-01 | Discharge: 2018-07-01 | Disposition: A | Discharge status: Home / Self Care | Payer: 59 | Source: Ambulatory Visit | Attending: Radiation Oncology | Admitting: Radiation Oncology

## 2018-07-01 DIAGNOSIS — Z510 Encounter for antineoplastic radiation therapy: Secondary | ICD-10-CM | POA: Diagnosis not present

## 2018-07-04 ENCOUNTER — Ambulatory Visit
Admission: RE | Admit: 2018-07-04 | Discharge: 2018-07-04 | Disposition: A | Discharge status: Home / Self Care | Payer: 59 | Source: Ambulatory Visit | Attending: Radiation Oncology | Admitting: Radiation Oncology

## 2018-07-04 DIAGNOSIS — Z510 Encounter for antineoplastic radiation therapy: Secondary | ICD-10-CM | POA: Diagnosis not present

## 2018-07-05 ENCOUNTER — Ambulatory Visit
Admission: RE | Admit: 2018-07-05 | Discharge: 2018-07-05 | Disposition: A | Discharge status: Home / Self Care | Payer: 59 | Source: Ambulatory Visit | Attending: Radiation Oncology | Admitting: Radiation Oncology

## 2018-07-05 DIAGNOSIS — Z510 Encounter for antineoplastic radiation therapy: Secondary | ICD-10-CM | POA: Diagnosis not present

## 2018-07-05 DIAGNOSIS — C20 Malignant neoplasm of rectum: Secondary | ICD-10-CM | POA: Diagnosis present

## 2018-07-06 ENCOUNTER — Ambulatory Visit
Admission: RE | Admit: 2018-07-06 | Discharge: 2018-07-06 | Disposition: A | Discharge status: Home / Self Care | Payer: 59 | Source: Ambulatory Visit | Attending: Radiation Oncology | Admitting: Radiation Oncology

## 2018-07-06 ENCOUNTER — Telehealth: Payer: Self-pay | Admitting: Nurse Practitioner

## 2018-07-06 ENCOUNTER — Encounter: Payer: Self-pay | Admitting: Nurse Practitioner

## 2018-07-06 ENCOUNTER — Inpatient Hospital Stay: Payer: 59

## 2018-07-06 ENCOUNTER — Inpatient Hospital Stay: Payer: 59 | Attending: Nurse Practitioner | Admitting: Nurse Practitioner

## 2018-07-06 VITALS — BP 103/65 | HR 91 | Temp 98.2°F | Resp 17 | Ht 69.0 in | Wt 183.7 lb

## 2018-07-06 DIAGNOSIS — C20 Malignant neoplasm of rectum: Secondary | ICD-10-CM | POA: Diagnosis present

## 2018-07-06 DIAGNOSIS — E860 Dehydration: Secondary | ICD-10-CM | POA: Diagnosis not present

## 2018-07-06 DIAGNOSIS — Z79899 Other long term (current) drug therapy: Secondary | ICD-10-CM

## 2018-07-06 DIAGNOSIS — R197 Diarrhea, unspecified: Secondary | ICD-10-CM | POA: Insufficient documentation

## 2018-07-06 DIAGNOSIS — N201 Calculus of ureter: Secondary | ICD-10-CM | POA: Diagnosis not present

## 2018-07-06 DIAGNOSIS — R140 Abdominal distension (gaseous): Secondary | ICD-10-CM | POA: Insufficient documentation

## 2018-07-06 DIAGNOSIS — Z510 Encounter for antineoplastic radiation therapy: Secondary | ICD-10-CM | POA: Diagnosis not present

## 2018-07-06 DIAGNOSIS — Z720 Tobacco use: Secondary | ICD-10-CM | POA: Diagnosis not present

## 2018-07-06 LAB — CBC WITH DIFFERENTIAL (CANCER CENTER ONLY)
Basophils Absolute: 0 10*3/uL (ref 0.0–0.1)
Basophils Relative: 0 %
EOS PCT: 16 %
Eosinophils Absolute: 0.7 10*3/uL — ABNORMAL HIGH (ref 0.0–0.5)
HEMATOCRIT: 35.7 % (ref 34.8–46.6)
Hemoglobin: 12 g/dL (ref 11.6–15.9)
LYMPHS PCT: 10 %
Lymphs Abs: 0.4 10*3/uL — ABNORMAL LOW (ref 0.9–3.3)
MCH: 32 pg (ref 25.1–34.0)
MCHC: 33.7 g/dL (ref 31.5–36.0)
MCV: 94.9 fL (ref 79.5–101.0)
MONO ABS: 0.3 10*3/uL (ref 0.1–0.9)
Monocytes Relative: 8 %
Neutro Abs: 2.7 10*3/uL (ref 1.5–6.5)
Neutrophils Relative %: 66 %
Platelet Count: 166 10*3/uL (ref 145–400)
RBC: 3.77 MIL/uL (ref 3.70–5.45)
RDW: 12.7 % (ref 11.2–14.5)
WBC Count: 4.1 10*3/uL (ref 3.9–10.3)

## 2018-07-06 LAB — CMP (CANCER CENTER ONLY)
ALBUMIN: 3.5 g/dL (ref 3.5–5.0)
ALT: 55 U/L — ABNORMAL HIGH (ref 0–44)
AST: 36 U/L (ref 15–41)
Alkaline Phosphatase: 89 U/L (ref 38–126)
Anion gap: 9 (ref 5–15)
BILIRUBIN TOTAL: 0.6 mg/dL (ref 0.3–1.2)
BUN: 19 mg/dL (ref 6–20)
CHLORIDE: 104 mmol/L (ref 98–111)
CO2: 28 mmol/L (ref 22–32)
Calcium: 8.9 mg/dL (ref 8.9–10.3)
Creatinine: 0.89 mg/dL (ref 0.44–1.00)
GFR, Est AFR Am: >60 mL/min (ref >60)
GFR, Estimated: >60 mL/min (ref >60)
GLUCOSE: 107 mg/dL — AB (ref 70–99)
POTASSIUM: 3.8 mmol/L (ref 3.5–5.1)
Sodium: 141 mmol/L (ref 135–145)
Total Protein: 6.3 g/dL — ABNORMAL LOW (ref 6.5–8.1)

## 2018-07-06 NOTE — Telephone Encounter (Signed)
Gave pt avs and calendar  °

## 2018-07-06 NOTE — Progress Notes (Addendum)
  [place] OFFICE PROGRESS NOTE   Diagnosis: Rectal cancer  INTERVAL HISTORY:   Norma Colon returns as scheduled.  She continues radiation and Xeloda.  She has occasional mild nausea.  No vomiting.  No mouth sores.  No diarrhea.  No hand or foot pain or redness.  She notes increased fatigue.  She has had recent "bloating".  She is taking Beano and Pepto-Bismol.  She reports bowel movements are regular, soft.  She is no longer having rectal bleeding.  She reports good fluid intake.  Objective:  Vital signs in last 24 hours:  Blood pressure 103/65, pulse 91, temperature 98.2 F (36.8 C), temperature source Oral, resp. rate 17, height 5\' 9"  (1.753 m), weight 183 lb 11.2 oz (83.3 kg), SpO2 100 %.    HEENT: No thrush or ulcers. Resp: Lungs clear bilaterally. Cardio: Regular rate and rhythm. GI: Abdomen soft and nontender.  No hepatomegaly.  No apparent ascites. Vascular: No leg edema.  Skin: Palms without erythema.   Lab Results:  Lab Results  Component Value Date   WBC 4.1 07/06/2018   HGB 12.0 07/06/2018   HCT 35.7 07/06/2018   MCV 94.9 07/06/2018   PLT 166 07/06/2018   NEUTROABS 2.7 07/06/2018    Imaging:  No results found.  Medications: I have reviewed the patient's current medications.  Assessment/Plan: 1. Rectal cancer ? 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 CTs 05/24/2018-mass beginning at proximal he 7.6 cm from the anal verge, "20 "perirectal lymph nodes, no evidence of metastatic disease ? Pelvic MRI 06/01/2018- tumor measured at 10.8 cm from the anal sphincter, T3b, N1-2 left perirectal lymph nodes, each measuring 5 mm ? Radiation and Xeloda initiated 06/13/2018 2. Tobacco use 3. 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 Migraine headaches 5. Restless legs  Disposition: Ms. Fuerstenberg appears stable.  She continues Xeloda and  radiation.  She is scheduled to complete the course of concurrent therapy on 07/20/2018.  We reviewed the CBC from today.  Counts are adequate to continue with treatment.  She will return for lab and follow-up in approximately 4 weeks.  She will contact the office in the interim with any problems.   Patient seen with Dr. Benay Spice.     Genola Yuille ANP/GNP-BC   07/06/2018  10:35 AM  This was a shared visit with Ned Card.  Ms. Mccartney is tolerating the Xeloda and radiation well.  She will continue the current treatment.  Julieanne Manson, MD

## 2018-07-07 ENCOUNTER — Ambulatory Visit
Admission: RE | Admit: 2018-07-07 | Discharge: 2018-07-07 | Disposition: A | Discharge status: Home / Self Care | Payer: 59 | Source: Ambulatory Visit | Attending: Radiation Oncology | Admitting: Radiation Oncology

## 2018-07-07 DIAGNOSIS — Z510 Encounter for antineoplastic radiation therapy: Secondary | ICD-10-CM | POA: Diagnosis not present

## 2018-07-08 ENCOUNTER — Ambulatory Visit
Admission: RE | Admit: 2018-07-08 | Discharge: 2018-07-08 | Disposition: A | Discharge status: Home / Self Care | Payer: 59 | Source: Ambulatory Visit | Attending: Radiation Oncology | Admitting: Radiation Oncology

## 2018-07-08 DIAGNOSIS — Z510 Encounter for antineoplastic radiation therapy: Secondary | ICD-10-CM | POA: Diagnosis not present

## 2018-07-11 ENCOUNTER — Ambulatory Visit
Admission: RE | Admit: 2018-07-11 | Discharge: 2018-07-11 | Disposition: A | Discharge status: Home / Self Care | Payer: 59 | Source: Ambulatory Visit | Attending: Radiation Oncology | Admitting: Radiation Oncology

## 2018-07-11 DIAGNOSIS — Z510 Encounter for antineoplastic radiation therapy: Secondary | ICD-10-CM | POA: Diagnosis not present

## 2018-07-11 NOTE — Progress Notes (Signed)
Patient came around following her radiation treatment with complaints of gas pains, bloating, and diarrhea over the weekend.  She states the diarrhea has resolved as of this morning.  She continues to have gas pains and bloating.  She has taken over the counter gas x, mylanta, pepto, beano,and kaopectate with no relief.  She was seen in conjunction with PA Dara Lords.  She was encouraged to continue to use the gas x, beano and mylanta.  She was encouraged to eliminate consumption of raw fruits and vegetables and to try a heating pad to her abdomen.  Vitals stable.  Will continue to follow as necessary.  Norma Colon. Quincy Simmonds RN, BSN  BP 106/75 (BP Location: Left Arm)   Pulse 87   Temp 98 F (36.7 C) (Oral)   Resp 20   LMP  (LMP Unknown)   SpO2 100%

## 2018-07-12 ENCOUNTER — Ambulatory Visit
Admission: RE | Admit: 2018-07-12 | Discharge: 2018-07-12 | Disposition: A | Discharge status: Home / Self Care | Payer: 59 | Source: Ambulatory Visit | Attending: Radiation Oncology | Admitting: Radiation Oncology

## 2018-07-12 DIAGNOSIS — Z510 Encounter for antineoplastic radiation therapy: Secondary | ICD-10-CM | POA: Diagnosis not present

## 2018-07-13 ENCOUNTER — Telehealth: Payer: Self-pay | Admitting: Nutrition

## 2018-07-13 ENCOUNTER — Ambulatory Visit
Admission: RE | Admit: 2018-07-13 | Discharge: 2018-07-13 | Disposition: A | Discharge status: Home / Self Care | Payer: 59 | Source: Ambulatory Visit | Attending: Radiation Oncology | Admitting: Radiation Oncology

## 2018-07-13 ENCOUNTER — Telehealth: Payer: Self-pay | Admitting: Medical

## 2018-07-13 DIAGNOSIS — Z510 Encounter for antineoplastic radiation therapy: Secondary | ICD-10-CM | POA: Diagnosis not present

## 2018-07-13 NOTE — Telephone Encounter (Signed)
Called regarding 10/10

## 2018-07-13 NOTE — Progress Notes (Signed)
  Oncology Nurse Navigator Documentation Patient called with complaints of bloating and gas. Patient had spoke with providers in rad onc but would like to speak with another provider for other ideas. Patient requested to be seen after her  radiation apt on 07/14/18. Scheduling message sent for symptom management appointment. Information relayed to [employer] PA.

## 2018-07-13 NOTE — Telephone Encounter (Signed)
Patient called complaining of increased gas and bloating in conjunction with diarrhea. Patient is very frustrated that this has been going on for several days. She reports she is taking all medications as prescribed by physician. She feels some of the foods on the low fiber diet are contributing to increased gas. I reviewed both low fiber diet and foods that can cause gas for clarification.  Encourage patient to stay on a low fiber diet and avoid gas-forming foods. Patient reports she has made her list of acceptable foods and appreciated phone call.

## 2018-07-14 ENCOUNTER — Ambulatory Visit
Admission: RE | Admit: 2018-07-14 | Discharge: 2018-07-14 | Disposition: A | Discharge status: Home / Self Care | Payer: 59 | Source: Ambulatory Visit | Attending: Radiation Oncology | Admitting: Radiation Oncology

## 2018-07-14 ENCOUNTER — Inpatient Hospital Stay (HOSPITAL_BASED_OUTPATIENT_CLINIC_OR_DEPARTMENT_OTHER): Payer: 59 | Admitting: Medical

## 2018-07-14 VITALS — BP 109/58 | HR 85 | Temp 98.0°F | Resp 21 | Ht 69.0 in | Wt 181.0 lb

## 2018-07-14 DIAGNOSIS — C20 Malignant neoplasm of rectum: Secondary | ICD-10-CM | POA: Diagnosis not present

## 2018-07-14 DIAGNOSIS — Z79899 Other long term (current) drug therapy: Secondary | ICD-10-CM | POA: Diagnosis not present

## 2018-07-14 DIAGNOSIS — Z720 Tobacco use: Secondary | ICD-10-CM

## 2018-07-14 DIAGNOSIS — R197 Diarrhea, unspecified: Secondary | ICD-10-CM | POA: Diagnosis not present

## 2018-07-14 DIAGNOSIS — R140 Abdominal distension (gaseous): Secondary | ICD-10-CM | POA: Diagnosis not present

## 2018-07-14 DIAGNOSIS — Z510 Encounter for antineoplastic radiation therapy: Secondary | ICD-10-CM | POA: Diagnosis not present

## 2018-07-14 MED ORDER — DIPHENOXYLATE-ATROPINE 2.5-0.025 MG PO TABS
1.0000 | ORAL_TABLET | Freq: Once | ORAL | Status: AC
  Administered 2018-07-14: 1 via ORAL

## 2018-07-14 MED ORDER — DIPHENOXYLATE-ATROPINE 2.5-0.025 MG PO TABS: ORAL_TABLET | ORAL | 1 refills | Status: DC

## 2018-07-14 MED ORDER — SODIUM CHLORIDE 0.9 % IV SOLN
Freq: Once | INTRAVENOUS | Status: AC
  Administered 2018-07-14: 15:00:00 via INTRAVENOUS
  Filled 2018-07-14: qty 250

## 2018-07-14 MED ORDER — HYOSCYAMINE SULFATE 0.125 MG/ML PO SOLN: 0.1250 mg | ORAL | 1 refills | Status: DC | PRN

## 2018-07-14 MED ORDER — DIPHENOXYLATE-ATROPINE 2.5-0.025 MG PO TABS
ORAL_TABLET | ORAL | Status: AC
  Filled 2018-07-14: qty 1

## 2018-07-14 NOTE — Patient Instructions (Signed)
Dehydration, Adult Dehydration is when there is not enough fluid or water in your body. This happens when you lose more fluids than you take in. Dehydration can range from mild to very bad. It should be treated right away to keep it from getting very bad. Symptoms of mild dehydration may include:  Thirst.  Dry lips.  Slightly dry mouth.  Dry, warm skin.  Dizziness. Symptoms of moderate dehydration may include:  Very dry mouth.  Muscle cramps.  Dark pee (urine). Pee may be the color of tea.  Your body making less pee.  Your eyes making fewer tears.  Heartbeat that is uneven or faster than normal (palpitations).  Headache.  Light-headedness, especially when you stand up from sitting.  Fainting (syncope). Symptoms of very bad dehydration may include:  Changes in skin, such as: ? Cold and clammy skin. ? Blotchy (mottled) or pale skin. ? Skin that does not quickly return to normal after being lightly pinched and let go (poor skin turgor).  Changes in body fluids, such as: ? Feeling very thirsty. ? Your eyes making fewer tears. ? Not sweating when body temperature is high, such as in hot weather. ? Your body making very little pee.  Changes in vital signs, such as: ? Weak pulse. ? Pulse that is more than 100 beats a minute when you are sitting still. ? Fast breathing. ? Low blood pressure.  Other changes, such as: ? Sunken eyes. ? Cold hands and feet. ? Confusion. ? Lack of energy (lethargy). ? Trouble waking up from sleep. ? Short-term weight loss. ? Unconsciousness. Follow these instructions at home:  If told by your doctor, drink an ORS: ? Make an ORS by using instructions on the package. ? Start by drinking small amounts, about  cup (120 mL) every 5-10 minutes. ? Slowly drink more until you have had the amount that your doctor said to have.  Drink enough clear fluid to keep your pee clear or pale yellow. If you were told to drink an ORS, finish the ORS  first, then start slowly drinking clear fluids. Drink fluids such as: ? Water. Do not drink only water by itself. Doing that can make the salt (sodium) level in your body get too low (hyponatremia). ? Ice chips. ? Fruit juice that you have added water to (diluted). ? Low-calorie sports drinks.  Avoid: ? Alcohol. ? Drinks that have a lot of sugar. These include high-calorie sports drinks, fruit juice that does not have water added, and soda. ? Caffeine. ? Foods that are greasy or have a lot of fat or sugar.  Take over-the-counter and prescription medicines only as told by your doctor.  Do not take salt tablets. Doing that can make the salt level in your body get too high (hypernatremia).  Eat foods that have minerals (electrolytes). Examples include bananas, oranges, potatoes, tomatoes, and spinach.  Keep all follow-up visits as told by your doctor. This is important. Contact a doctor if:  You have belly (abdominal) pain that: ? Gets worse. ? Stays in one area (localizes).  You have a rash.  You have a stiff neck.  You get angry or annoyed more easily than normal (irritability).  You are more sleepy than normal.  You have a harder time waking up than normal.  You feel: ? Weak. ? Dizzy. ? Very thirsty.  You have peed (urinated) only a small amount of very dark pee during 6-8 hours. Get help right away if:  You have symptoms of   very bad dehydration.  You cannot drink fluids without throwing up (vomiting).  Your symptoms get worse with treatment.  You have a fever.  You have a very bad headache.  You are throwing up or having watery poop (diarrhea) and it: ? Gets worse. ? Does not go away.  You have blood or something green (bile) in your throw-up.  You have blood in your poop (stool). This may cause poop to look black and tarry.  You have not peed in 6-8 hours.  You pass out (faint).  Your heart rate when you are sitting still is more than 100 beats a  minute.  You have trouble breathing. This information is not intended to replace advice given to you by your health care provider. Make sure you discuss any questions you have with your health care provider. Document Released: 07/18/2009 Document Revised: 04/10/2016 Document Reviewed: 11/15/2015 Elsevier Interactive Patient Education  2018 [employer]vier Inc.  

## 2018-07-14 NOTE — Progress Notes (Signed)
Symptoms Management Clinic Progress Note   Norma Colon [id number] [birth year] 57 y.o.  Norma Colon is managed by Dr. Dominica Severin [name]  Actively treated with chemotherapy/immunotherapy: yes  Current Therapy: Xeloda and concurrent radiation therapy  Assessment: Plan:    Diarrhea, unspecified type - Plan: 0.9 %  sodium chloride infusion, diphenoxylate-atropine (LOMOTIL) 2.5-0.025 MG per tablet 1 tablet, diphenoxylate-atropine (LOMOTIL) 2.5-0.025 MG tablet  Bloating - Plan: hyoscyamine (LEVSIN) 0.125 MG/ML solution  Adenocarcinoma of rectum (HCC)   Diarrhea: The patient was given 1 L of normal saline IV today.  She was also given Lomotil 1 tablet p.o. x1 and was given a prescription for Lomotil with directions that she can take 1-2 4 times daily as needed.  Bloating and gas: The patient was given a prescription for hyoscyamine.  Adenocarcinoma of the rectum.  The patient will continue on Xeloda and concurrent radiation therapy with plans to complete her treatment next Wednesday.  Please see After Visit Summary for patient specific instructions.  Future Appointments  Date Time Provider [place]  07/18/2018 11:45 AM [facility] [id number] [facility] None  07/19/2018 11:45 AM [facility] [id number] [facility] None  07/20/2018 11:45 AM [facility] [id number] [facility] None  08/03/2018 10:30 AM [facility] LAB 6 [facility] None  08/03/2018 11:00 AM Ladell Pier, MD [facility] None    No orders of the defined types were placed in this encounter.     Subjective:   Patient ID:  Norma Colon is a 57 y.o. (DOB 05-Apr-1961) female.  Chief Complaint: No chief complaint on file.   HPI Norma Colon is a 57 year old female with rectal cancer who is managed by Dr. Dominica Severin B. Norma Colon. She continues to be treated with radiation and Xeloda.  She was last seen on 07/06/2018. She contacted the GI nurse navigator with a report of bloating, gas, diarrhea, and fecal incontinence.  She  reports that she has had bloating and diarrhea since last Friday.  She will be finishing radiation next Wednesday.  She continues on Xeloda Monday through Friday.  She reports having taken Mylanta, simethicone, Gas-X, Pepto-Bismol, and Imodium without benefit.  She denies nausea, vomiting, melena, bright red blood per rectum, fevers, chills, or sweats.  Medications: I have reviewed the patient's current medications.  Allergies:  Allergies  Allergen Reactions  . Codeine Other (See Comments)    Causes migraine    Past Medical History:  Diagnosis Date  . Anxiety   . Arthritis   . Depression   . HPV in female   . Scoliosis     Past Surgical History:  Procedure Laterality Date  . COLPOSCOPY  04/04/2018  . MRI  05/2018   [facility] [facility]   . SHOULDER ARTHROSCOPY W/ ROTATOR CUFF REPAIR Right     Family History  Problem Relation Age of Onset  . COPD Father        smoker  . Stomach cancer Father        spread to lungs and bones  . Arthritis Mother   . Heart attack Paternal Grandmother   . Heart attack Paternal Grandfather   . Heart attack Maternal Grandmother   . Stroke Maternal Grandmother   . Colon cancer Neg Hx     Social History   Socioeconomic History  . Marital status: Legally Separated    Spouse name: Not on file  . Number of children: 1  . Years of education: Not on file  . Highest education level: Not on file  Occupational History  . Occupation: Land  Social Needs  . Financial resource strain: Not on file  . Food insecurity:    Worry: Not on file    Inability: Not on file  . Transportation needs:    Medical: Not on file    Non-medical: Not on file  Tobacco Use  . Smoking status: Current Every Day Smoker    Years: 30.00    Types: Cigarettes  . Smokeless tobacco: Never Used  . Tobacco comment: tobacco info given 05/04/18  Substance and Sexual Activity  . Alcohol use: Yes    Comment: social  . Drug use: Never  . Sexual activity: Not on  file  Lifestyle  . Physical activity:    Days per week: Not on file    Minutes per session: Not on file  . Stress: Not on file  Relationships  . Social connections:    Talks on phone: Not on file    Gets together: Not on file    Attends religious service: Not on file    Active member of club or organization: Not on file    Attends meetings of clubs or organizations: Not on file    Relationship status: Not on file  . Intimate partner violence:    Fear of current or ex partner: No    Emotionally abused: No    Physically abused: No    Forced sexual activity: No  Other Topics Concern  . Not on file  Social History Narrative  . Not on file    Past Medical History, Surgical history, Social history, and Family history were reviewed and updated as appropriate.   Please see review of systems for further details on the patient's review from today.   Review of Systems:  Review of Systems  Constitutional: Negative for appetite change, chills, diaphoresis and fever.  Respiratory: Negative for cough, chest tightness and shortness of breath.   Cardiovascular: Negative for chest pain, palpitations and leg swelling.  Gastrointestinal: Positive for abdominal distention and diarrhea. Negative for abdominal pain, blood in stool, constipation, nausea and vomiting.  Genitourinary: Negative for decreased urine volume and difficulty urinating.  Neurological: Negative for weakness.    Objective:   Physical Exam:  BP (!) 109/58 (BP Location: Left Arm, Patient Position: Sitting)   Pulse 85   Temp 98 F (36.7 C) (Oral)   Resp (!) 21   Ht 5\' 9"  (1.753 m)   Wt 181 lb (82.1 kg)   LMP  (LMP Unknown)   SpO2 100%   BMI 26.73 kg/m  ECOG: 0  Physical Exam  Constitutional: No distress.  HENT:  Head: Normocephalic and atraumatic.  Mouth/Throat: Oropharynx is clear and moist.  Cardiovascular: Normal rate, regular rhythm and normal heart sounds. Exam reveals no gallop and no friction rub.  No  murmur heard. Pulmonary/Chest: Effort normal and breath sounds normal. No respiratory distress. She has no wheezes. She has no rales.  Abdominal: Soft. She exhibits no distension and no mass. Bowel sounds are increased. There is no tenderness. There is no rebound and no guarding.  Musculoskeletal: She exhibits no edema.  Neurological: She is alert.  Skin: Skin is warm and dry. She is not diaphoretic.    Lab Review:     Component Value Date/Time   NA 141 07/06/2018 0953   K 3.8 07/06/2018 0953   CL 104 07/06/2018 0953   CO2 28 07/06/2018 0953   GLUCOSE 107 (H) 07/06/2018 0953   BUN 19 07/06/2018 0953   CREATININE 0.89 07/06/2018 0953   CALCIUM  8.9 07/06/2018 0953   PROT 6.3 (L) 07/06/2018 0953   ALBUMIN 3.5 07/06/2018 0953   AST 36 07/06/2018 0953   ALT 55 (H) 07/06/2018 0953   ALKPHOS 89 07/06/2018 0953   BILITOT 0.6 07/06/2018 0953   GFRNONAA >60 07/06/2018 0953   GFRAA >60 07/06/2018 0953       Component Value Date/Time   WBC 4.1 07/06/2018 0953   RBC 3.77 07/06/2018 0953   HGB 12.0 07/06/2018 0953   HCT 35.7 07/06/2018 0953   PLT 166 07/06/2018 0953   MCV 94.9 07/06/2018 0953   MCH 32.0 07/06/2018 0953   MCHC 33.7 07/06/2018 0953   RDW 12.7 07/06/2018 0953   LYMPHSABS 0.4 (L) 07/06/2018 0953   MONOABS 0.3 07/06/2018 0953   EOSABS 0.7 (H) 07/06/2018 0953   BASOSABS 0.0 07/06/2018 0953   -------------------------------  Imaging from last 24 hours (if applicable):  Radiology interpretation: No results found.

## 2018-07-15 ENCOUNTER — Ambulatory Visit
Admission: RE | Admit: 2018-07-15 | Discharge: 2018-07-15 | Disposition: A | Discharge status: Home / Self Care | Payer: 59 | Source: Ambulatory Visit | Attending: Radiation Oncology | Admitting: Radiation Oncology

## 2018-07-15 DIAGNOSIS — Z510 Encounter for antineoplastic radiation therapy: Secondary | ICD-10-CM | POA: Diagnosis not present

## 2018-07-15 NOTE — Progress Notes (Signed)
Please check on her 07/18/2018 to see if the diarrhea has improved

## 2018-07-18 ENCOUNTER — Ambulatory Visit
Admission: RE | Admit: 2018-07-18 | Discharge: 2018-07-18 | Disposition: A | Discharge status: Home / Self Care | Payer: 59 | Source: Ambulatory Visit | Attending: Radiation Oncology | Admitting: Radiation Oncology

## 2018-07-18 ENCOUNTER — Other Ambulatory Visit: Payer: Self-pay | Admitting: *Deleted

## 2018-07-18 ENCOUNTER — Inpatient Hospital Stay (HOSPITAL_BASED_OUTPATIENT_CLINIC_OR_DEPARTMENT_OTHER): Payer: 59 | Admitting: Medical

## 2018-07-18 ENCOUNTER — Inpatient Hospital Stay: Payer: 59

## 2018-07-18 ENCOUNTER — Other Ambulatory Visit: Payer: Self-pay | Admitting: Emergency Medicine

## 2018-07-18 VITALS — BP 120/64 | HR 85 | Temp 98.3°F | Resp 18 | Ht 69.0 in | Wt 180.1 lb

## 2018-07-18 DIAGNOSIS — R197 Diarrhea, unspecified: Secondary | ICD-10-CM

## 2018-07-18 DIAGNOSIS — C20 Malignant neoplasm of rectum: Secondary | ICD-10-CM | POA: Diagnosis not present

## 2018-07-18 DIAGNOSIS — E860 Dehydration: Secondary | ICD-10-CM | POA: Diagnosis not present

## 2018-07-18 DIAGNOSIS — Z510 Encounter for antineoplastic radiation therapy: Secondary | ICD-10-CM | POA: Diagnosis not present

## 2018-07-18 DIAGNOSIS — Z79899 Other long term (current) drug therapy: Secondary | ICD-10-CM | POA: Diagnosis not present

## 2018-07-18 DIAGNOSIS — Z720 Tobacco use: Secondary | ICD-10-CM

## 2018-07-18 LAB — CBC WITH DIFFERENTIAL (CANCER CENTER ONLY)
Abs Immature Granulocytes: 0.04 10*3/uL (ref 0.00–0.07)
BASOS ABS: 0 10*3/uL (ref 0.0–0.1)
Basophils Relative: 1 %
Eosinophils Absolute: 0.4 10*3/uL (ref 0.0–0.5)
Eosinophils Relative: 10 %
HEMATOCRIT: 33.4 % — AB (ref 36.0–46.0)
Hemoglobin: 11.3 g/dL — ABNORMAL LOW (ref 12.0–15.0)
IMMATURE GRANULOCYTES: 1 %
LYMPHS ABS: 0.4 10*3/uL — AB (ref 0.7–4.0)
LYMPHS PCT: 11 %
MCH: 32.4 pg (ref 26.0–34.0)
MCHC: 33.8 g/dL (ref 30.0–36.0)
MCV: 95.7 fL (ref 80.0–100.0)
Monocytes Absolute: 0.8 10*3/uL (ref 0.1–1.0)
Monocytes Relative: 19 %
NEUTROS PCT: 58 %
NRBC: 0 % (ref 0.0–0.2)
Neutro Abs: 2.4 10*3/uL (ref 1.7–7.7)
PLATELETS: 189 10*3/uL (ref 150–400)
RBC: 3.49 MIL/uL — AB (ref 3.87–5.11)
RDW: 14.3 % (ref 11.5–15.5)
WBC: 4 10*3/uL (ref 4.0–10.5)

## 2018-07-18 LAB — CMP (CANCER CENTER ONLY)
ALT: 176 U/L — AB (ref 0–44)
AST: 84 U/L — ABNORMAL HIGH (ref 15–41)
Albumin: 3.5 g/dL (ref 3.5–5.0)
Alkaline Phosphatase: 104 U/L (ref 38–126)
Anion gap: 9 (ref 5–15)
BUN: 15 mg/dL (ref 6–20)
CO2: 26 mmol/L (ref 22–32)
Calcium: 9.3 mg/dL (ref 8.9–10.3)
Chloride: 105 mmol/L (ref 98–111)
Creatinine: 0.81 mg/dL (ref 0.44–1.00)
Glucose, Bld: 94 mg/dL (ref 70–99)
POTASSIUM: 3.6 mmol/L (ref 3.5–5.1)
SODIUM: 140 mmol/L (ref 135–145)
Total Bilirubin: 1.5 mg/dL — ABNORMAL HIGH (ref 0.3–1.2)
Total Protein: 6.5 g/dL (ref 6.5–8.1)

## 2018-07-18 LAB — C DIFFICILE QUICK SCREEN W PCR REFLEX
C DIFFICILE (CDIFF) INTERP: NOT DETECTED
C DIFFICILE (CDIFF) TOXIN: NEGATIVE
C DIFFICLE (CDIFF) ANTIGEN: NEGATIVE

## 2018-07-18 LAB — MAGNESIUM: Magnesium: 1.8 mg/dL (ref 1.7–2.4)

## 2018-07-18 MED ORDER — SODIUM CHLORIDE 0.9 % IV SOLN
INTRAVENOUS | Status: AC
  Administered 2018-07-18: 15:00:00 via INTRAVENOUS
  Filled 2018-07-18 (×2): qty 250

## 2018-07-18 NOTE — Patient Instructions (Signed)
Dehydration, Adult Dehydration is when there is not enough fluid or water in your body. This happens when you lose more fluids than you take in. Dehydration can range from mild to very bad. It should be treated right away to keep it from getting very bad. Symptoms of mild dehydration may include:  Thirst.  Dry lips.  Slightly dry mouth.  Dry, warm skin.  Dizziness. Symptoms of moderate dehydration may include:  Very dry mouth.  Muscle cramps.  Dark pee (urine). Pee may be the color of tea.  Your body making less pee.  Your eyes making fewer tears.  Heartbeat that is uneven or faster than normal (palpitations).  Headache.  Light-headedness, especially when you stand up from sitting.  Fainting (syncope). Symptoms of very bad dehydration may include:  Changes in skin, such as: ? Cold and clammy skin. ? Blotchy (mottled) or pale skin. ? Skin that does not quickly return to normal after being lightly pinched and let go (poor skin turgor).  Changes in body fluids, such as: ? Feeling very thirsty. ? Your eyes making fewer tears. ? Not sweating when body temperature is high, such as in hot weather. ? Your body making very little pee.  Changes in vital signs, such as: ? Weak pulse. ? Pulse that is more than 100 beats a minute when you are sitting still. ? Fast breathing. ? Low blood pressure.  Other changes, such as: ? Sunken eyes. ? Cold hands and feet. ? Confusion. ? Lack of energy (lethargy). ? Trouble waking up from sleep. ? Short-term weight loss. ? Unconsciousness. Follow these instructions at home:  If told by your doctor, drink an ORS: ? Make an ORS by using instructions on the package. ? Start by drinking small amounts, about  cup (120 mL) every 5-10 minutes. ? Slowly drink more until you have had the amount that your doctor said to have.  Drink enough clear fluid to keep your pee clear or pale yellow. If you were told to drink an ORS, finish the ORS  first, then start slowly drinking clear fluids. Drink fluids such as: ? Water. Do not drink only water by itself. Doing that can make the salt (sodium) level in your body get too low (hyponatremia). ? Ice chips. ? Fruit juice that you have added water to (diluted). ? Low-calorie sports drinks.  Avoid: ? Alcohol. ? Drinks that have a lot of sugar. These include high-calorie sports drinks, fruit juice that does not have water added, and soda. ? Caffeine. ? Foods that are greasy or have a lot of fat or sugar.  Take over-the-counter and prescription medicines only as told by your doctor.  Do not take salt tablets. Doing that can make the salt level in your body get too high (hypernatremia).  Eat foods that have minerals (electrolytes). Examples include bananas, oranges, potatoes, tomatoes, and spinach.  Keep all follow-up visits as told by your doctor. This is important. Contact a doctor if:  You have belly (abdominal) pain that: ? Gets worse. ? Stays in one area (localizes).  You have a rash.  You have a stiff neck.  You get angry or annoyed more easily than normal (irritability).  You are more sleepy than normal.  You have a harder time waking up than normal.  You feel: ? Weak. ? Dizzy. ? Very thirsty.  You have peed (urinated) only a small amount of very dark pee during 6-8 hours. Get help right away if:  You have symptoms of   very bad dehydration.  You cannot drink fluids without throwing up (vomiting).  Your symptoms get worse with treatment.  You have a fever.  You have a very bad headache.  You are throwing up or having watery poop (diarrhea) and it: ? Gets worse. ? Does not go away.  You have blood or something green (bile) in your throw-up.  You have blood in your poop (stool). This may cause poop to look black and tarry.  You have not peed in 6-8 hours.  You pass out (faint).  Your heart rate when you are sitting still is more than 100 beats a  minute.  You have trouble breathing. This information is not intended to replace advice given to you by your health care provider. Make sure you discuss any questions you have with your health care provider. Document Released: 07/18/2009 Document Revised: 04/10/2016 Document Reviewed: 11/15/2015 Elsevier Interactive Patient Education  2018 [employer]vier Inc.  

## 2018-07-18 NOTE — Progress Notes (Signed)
Patient continued to have diarrhea complications and was evaluated in [facility] today.

## 2018-07-18 NOTE — Progress Notes (Signed)
Patient came around following her radiation treatment with complaints of upper abdominal after swallowing food, pain in her rectum, bloating, and diarrhea.  She was seen in symptom management on 07/14/2018 and was started on lomotil and hyoscyamine.  The patient states that over the weekend she was unable to really eat anything, having pain in her abdomen when she eats.  She also endorses some increased diarrhea that was not relieved with lomotil, having gone to the bathroom 3 times in 30 minutes.  She states she still has significant bloating unable to button her jeans.  I reached out to the symptom management nurse who states that they will send her for labs and see her in the symptom management clinic.  The patient was updated and verbalized understanding.  Will continue to follow as necessary.  Gloriajean Dell. Leonie Green, BSN

## 2018-07-19 ENCOUNTER — Inpatient Hospital Stay: Payer: 59

## 2018-07-19 ENCOUNTER — Other Ambulatory Visit: Payer: Self-pay | Admitting: Medical

## 2018-07-19 ENCOUNTER — Ambulatory Visit
Admission: RE | Admit: 2018-07-19 | Discharge: 2018-07-19 | Disposition: A | Discharge status: Home / Self Care | Payer: 59 | Source: Ambulatory Visit | Attending: Radiation Oncology | Admitting: Radiation Oncology

## 2018-07-19 VITALS — BP 114/58 | HR 86 | Temp 98.4°F | Resp 20 | Ht 69.0 in | Wt 182.4 lb

## 2018-07-19 DIAGNOSIS — R197 Diarrhea, unspecified: Secondary | ICD-10-CM

## 2018-07-19 DIAGNOSIS — E860 Dehydration: Secondary | ICD-10-CM

## 2018-07-19 DIAGNOSIS — Z510 Encounter for antineoplastic radiation therapy: Secondary | ICD-10-CM | POA: Diagnosis not present

## 2018-07-19 DIAGNOSIS — C20 Malignant neoplasm of rectum: Secondary | ICD-10-CM | POA: Diagnosis not present

## 2018-07-19 MED ORDER — SODIUM CHLORIDE 0.9 % IV SOLN
Freq: Once | INTRAVENOUS | Status: AC
  Administered 2018-07-19: 13:00:00 via INTRAVENOUS
  Filled 2018-07-19: qty 250

## 2018-07-19 NOTE — Patient Instructions (Signed)
Dehydration, Adult Dehydration is when there is not enough fluid or water in your body. This happens when you lose more fluids than you take in. Dehydration can range from mild to very bad. It should be treated right away to keep it from getting very bad. Symptoms of mild dehydration may include:  Thirst.  Dry lips.  Slightly dry mouth.  Dry, warm skin.  Dizziness. Symptoms of moderate dehydration may include:  Very dry mouth.  Muscle cramps.  Dark pee (urine). Pee may be the color of tea.  Your body making less pee.  Your eyes making fewer tears.  Heartbeat that is uneven or faster than normal (palpitations).  Headache.  Light-headedness, especially when you stand up from sitting.  Fainting (syncope). Symptoms of very bad dehydration may include:  Changes in skin, such as: ? Cold and clammy skin. ? Blotchy (mottled) or pale skin. ? Skin that does not quickly return to normal after being lightly pinched and let go (poor skin turgor).  Changes in body fluids, such as: ? Feeling very thirsty. ? Your eyes making fewer tears. ? Not sweating when body temperature is high, such as in hot weather. ? Your body making very little pee.  Changes in vital signs, such as: ? Weak pulse. ? Pulse that is more than 100 beats a minute when you are sitting still. ? Fast breathing. ? Low blood pressure.  Other changes, such as: ? Sunken eyes. ? Cold hands and feet. ? Confusion. ? Lack of energy (lethargy). ? Trouble waking up from sleep. ? Short-term weight loss. ? Unconsciousness. Follow these instructions at home:  If told by your doctor, drink an ORS: ? Make an ORS by using instructions on the package. ? Start by drinking small amounts, about  cup (120 mL) every 5-10 minutes. ? Slowly drink more until you have had the amount that your doctor said to have.  Drink enough clear fluid to keep your pee clear or pale yellow. If you were told to drink an ORS, finish the ORS  first, then start slowly drinking clear fluids. Drink fluids such as: ? Water. Do not drink only water by itself. Doing that can make the salt (sodium) level in your body get too low (hyponatremia). ? Ice chips. ? Fruit juice that you have added water to (diluted). ? Low-calorie sports drinks.  Avoid: ? Alcohol. ? Drinks that have a lot of sugar. These include high-calorie sports drinks, fruit juice that does not have water added, and soda. ? Caffeine. ? Foods that are greasy or have a lot of fat or sugar.  Take over-the-counter and prescription medicines only as told by your doctor.  Do not take salt tablets. Doing that can make the salt level in your body get too high (hypernatremia).  Eat foods that have minerals (electrolytes). Examples include bananas, oranges, potatoes, tomatoes, and spinach.  Keep all follow-up visits as told by your doctor. This is important. Contact a doctor if:  You have belly (abdominal) pain that: ? Gets worse. ? Stays in one area (localizes).  You have a rash.  You have a stiff neck.  You get angry or annoyed more easily than normal (irritability).  You are more sleepy than normal.  You have a harder time waking up than normal.  You feel: ? Weak. ? Dizzy. ? Very thirsty.  You have peed (urinated) only a small amount of very dark pee during 6-8 hours. Get help right away if:  You have symptoms of   very bad dehydration.  You cannot drink fluids without throwing up (vomiting).  Your symptoms get worse with treatment.  You have a fever.  You have a very bad headache.  You are throwing up or having watery poop (diarrhea) and it: ? Gets worse. ? Does not go away.  You have blood or something green (bile) in your throw-up.  You have blood in your poop (stool). This may cause poop to look black and tarry.  You have not peed in 6-8 hours.  You pass out (faint).  Your heart rate when you are sitting still is more than 100 beats a  minute.  You have trouble breathing. This information is not intended to replace advice given to you by your health care provider. Make sure you discuss any questions you have with your health care provider. Document Released: 07/18/2009 Document Revised: 04/10/2016 Document Reviewed: 11/15/2015 Elsevier Interactive Patient Education  2018 [employer]vier Inc.  

## 2018-07-20 ENCOUNTER — Ambulatory Visit
Admission: RE | Admit: 2018-07-20 | Discharge: 2018-07-20 | Disposition: A | Discharge status: Home / Self Care | Payer: 59 | Source: Ambulatory Visit | Attending: Radiation Oncology | Admitting: Radiation Oncology

## 2018-07-20 ENCOUNTER — Other Ambulatory Visit: Payer: Self-pay | Admitting: Medical

## 2018-07-20 ENCOUNTER — Telehealth: Payer: Self-pay | Admitting: Oncology

## 2018-07-20 ENCOUNTER — Encounter: Payer: Self-pay | Admitting: Radiation Oncology

## 2018-07-20 DIAGNOSIS — Z510 Encounter for antineoplastic radiation therapy: Secondary | ICD-10-CM | POA: Diagnosis not present

## 2018-07-20 DIAGNOSIS — R197 Diarrhea, unspecified: Secondary | ICD-10-CM

## 2018-07-20 MED ORDER — SODIUM CHLORIDE 0.9 % IV SOLN
INTRAVENOUS | Status: DC
  Filled 2018-07-20: qty 250

## 2018-07-20 NOTE — Telephone Encounter (Signed)
Appt scheduled patient notified per 10/16 sch msg °

## 2018-07-20 NOTE — Progress Notes (Signed)
Symptoms Management Clinic Progress Note   Norma Colon [id number] 12-Apr-1961 57 y.o.  Norma Colon is managed by Dr. Dominica Severin B. Sherrill  Actively treated with chemotherapy/immunotherapy: yes  Current Therapy: Xeloda and radiation therapy   Assessment: Plan:    Dehydration - Plan: 0.9 %  sodium chloride infusion  Diarrhea, unspecified type - Plan: C difficile quick screen w PCR reflex  Adenocarcinoma of rectum ([place])   Dehydration secondary to diarrhea: The patient was given 2 liters of normal saline IV and was given Lomotil 2 tablets PO x 1.  A stool for C. difficile was collected today.  She will return tomorrow for 2 L of additional IV fluids.  Adenocarcinoma of the rectum: The patient was told to hold Xeloda.  She was told to alternate Lomotil 1 to 2 tablets p.o. 4 times daily with Imodium 1 to 2 tablets p.o. 4 times daily.  She will return tomorrow for additional of 2 liters of IV fluids.  Please see After Visit Summary for patient specific instructions.  Future Appointments  Date Time Provider [place]  08/03/2018 10:30 AM [facility] LAB 6 [facility] None  08/03/2018 11:00 AM Ladell Pier, MD [facility] None  08/22/2018 10:00 AM Hayden Pedro, PA-C [facility] None    Orders Placed This Encounter  Procedures  . C difficile quick screen w PCR reflex       Subjective:   Patient ID:  Norma Colon is a 57 y.o. (DOB 1961/07/10) female.  Chief Complaint: No chief complaint on file.   HPI Norma Colon  is a 57 year old female with rectal cancer who is managed by Dr. Dominica Severin B. Sherrill. She was last seen on 07/14/2018.  She was initially better after fluids on Friday but had a recurrence of diarrhea on Saturday which has continued.  She was given hyoscyamine for abdominal pain.  This did not prove beneficial.  She continues to have upper abdominal pain.  She continues to have significant diarrhea and has had at least 4 stools since her arrival here at  11:45 today.  She has not taken any Lomotil since 6:00 this morning.  She denies fevers, chills, sweats.  She is not eating much as she states that everything that she eats goes right through her.    Medications: I have reviewed the patient's current medications.  Allergies:  Allergies  Allergen Reactions  . Codeine Other (See Comments)    Causes migraine    Past Medical History:  Diagnosis Date  . Anxiety   . Arthritis   . Depression   . HPV in female   . Scoliosis     Past Surgical History:  Procedure Laterality Date  . COLPOSCOPY  04/04/2018  . MRI  05/2018   [facility] [facility]   . SHOULDER ARTHROSCOPY W/ ROTATOR CUFF REPAIR Right     Family History  Problem Relation Age of Onset  . COPD Father        smoker  . Stomach cancer Father        spread to lungs and bones  . Arthritis Mother   . Heart attack Paternal Grandmother   . Heart attack Paternal Grandfather   . Heart attack Maternal Grandmother   . Stroke Maternal Grandmother   . Colon cancer Neg Hx     Social History   Socioeconomic History  . Marital status: Legally Separated    Spouse name: Not on file  . Number of children: 1  . Years of education: Not on file  .  Highest education level: Not on file  Occupational History  . Occupation: Land  Social Needs  . Financial resource strain: Not on file  . Food insecurity:    Worry: Not on file    Inability: Not on file  . Transportation needs:    Medical: Not on file    Non-medical: Not on file  Tobacco Use  . Smoking status: Current Every Day Smoker    Years: 30.00    Types: Cigarettes  . Smokeless tobacco: Never Used  . Tobacco comment: tobacco info given 05/04/18  Substance and Sexual Activity  . Alcohol use: Yes    Comment: social  . Drug use: Never  . Sexual activity: Not on file  Lifestyle  . Physical activity:    Days per week: Not on file    Minutes per session: Not on file  . Stress: Not on file  Relationships  .  Social connections:    Talks on phone: Not on file    Gets together: Not on file    Attends religious service: Not on file    Active member of club or organization: Not on file    Attends meetings of clubs or organizations: Not on file    Relationship status: Not on file  . Intimate partner violence:    Fear of current or ex partner: No    Emotionally abused: No    Physically abused: No    Forced sexual activity: No  Other Topics Concern  . Not on file  Social History Narrative  . Not on file    Past Medical History, Surgical history, Social history, and Family history were reviewed and updated as appropriate.   Please see review of systems for further details on the patient's review from today.   Review of Systems:  Review of Systems  Constitutional: Negative for activity change, appetite change, chills, diaphoresis and fever.  Respiratory: Negative for cough, chest tightness and shortness of breath.   Cardiovascular: Negative for chest pain, palpitations and leg swelling.  Gastrointestinal: Positive for abdominal pain and diarrhea. Negative for abdominal distention, blood in stool, constipation, nausea and vomiting.  Genitourinary: Negative for decreased urine volume and difficulty urinating.  Neurological: Negative for weakness.    Objective:   Physical Exam:  BP 120/64 (BP Location: Left Arm, Patient Position: Sitting)   Pulse 85   Temp 98.3 F (36.8 C) (Oral)   Resp 18   Ht 5\' 9"  (1.753 m)   Wt 180 lb 1.6 oz (81.7 kg)   LMP  (LMP Unknown)   SpO2 100%   BMI 26.60 kg/m  ECOG: 1  Physical Exam  Constitutional: No distress.  HENT:  Head: Normocephalic and atraumatic.  Cardiovascular: Normal rate, regular rhythm and normal heart sounds. Exam reveals no gallop and no friction rub.  No murmur heard. Pulmonary/Chest: Effort normal and breath sounds normal. No respiratory distress. She has no wheezes. She has no rales.  Abdominal: Soft. Bowel sounds are normal. She  exhibits no distension and no mass. There is no tenderness. There is no guarding.  Neurological: She is alert.  Skin: Skin is warm and dry. No rash noted. She is not diaphoretic. No erythema.    Lab Review:     Component Value Date/Time   NA 140 07/18/2018 1251   K 3.6 07/18/2018 1251   CL 105 07/18/2018 1251   CO2 26 07/18/2018 1251   GLUCOSE 94 07/18/2018 1251   BUN 15 07/18/2018 1251   CREATININE 0.81  07/18/2018 1251   CALCIUM 9.3 07/18/2018 1251   PROT 6.5 07/18/2018 1251   ALBUMIN 3.5 07/18/2018 1251   AST 84 (H) 07/18/2018 1251   ALT 176 (H) 07/18/2018 1251   ALKPHOS 104 07/18/2018 1251   BILITOT 1.5 (H) 07/18/2018 1251   GFRNONAA >60 07/18/2018 1251   GFRAA >60 07/18/2018 1251       Component Value Date/Time   WBC 4.0 07/18/2018 1251   RBC 3.49 (L) 07/18/2018 1251   HGB 11.3 (L) 07/18/2018 1251   HCT 33.4 (L) 07/18/2018 1251   PLT 189 07/18/2018 1251   MCV 95.7 07/18/2018 1251   MCH 32.4 07/18/2018 1251   MCHC 33.8 07/18/2018 1251   RDW 14.3 07/18/2018 1251   LYMPHSABS 0.4 (L) 07/18/2018 1251   MONOABS 0.8 07/18/2018 1251   EOSABS 0.4 07/18/2018 1251   BASOSABS 0.0 07/18/2018 1251   -------------------------------  Imaging from last 24 hours (if applicable):  Radiology interpretation: No results found.      This case was discussed with Dr. Benay Spice. He expressed agreement with my management of this patient.

## 2018-07-22 ENCOUNTER — Inpatient Hospital Stay: Payer: 59

## 2018-07-22 DIAGNOSIS — R197 Diarrhea, unspecified: Secondary | ICD-10-CM

## 2018-07-22 DIAGNOSIS — E860 Dehydration: Secondary | ICD-10-CM

## 2018-07-22 DIAGNOSIS — C20 Malignant neoplasm of rectum: Secondary | ICD-10-CM | POA: Diagnosis not present

## 2018-07-22 MED ORDER — SODIUM CHLORIDE 0.9 % IV SOLN
INTRAVENOUS | Status: AC
  Administered 2018-07-22: 15:00:00 via INTRAVENOUS
  Filled 2018-07-22 (×2): qty 250

## 2018-07-22 NOTE — Patient Instructions (Signed)
Dehydration, Adult Dehydration is when there is not enough fluid or water in your body. This happens when you lose more fluids than you take in. Dehydration can range from mild to very bad. It should be treated right away to keep it from getting very bad. Symptoms of mild dehydration may include:  Thirst.  Dry lips.  Slightly dry mouth.  Dry, warm skin.  Dizziness. Symptoms of moderate dehydration may include:  Very dry mouth.  Muscle cramps.  Dark pee (urine). Pee may be the color of tea.  Your body making less pee.  Your eyes making fewer tears.  Heartbeat that is uneven or faster than normal (palpitations).  Headache.  Light-headedness, especially when you stand up from sitting.  Fainting (syncope). Symptoms of very bad dehydration may include:  Changes in skin, such as: ? Cold and clammy skin. ? Blotchy (mottled) or pale skin. ? Skin that does not quickly return to normal after being lightly pinched and let go (poor skin turgor).  Changes in body fluids, such as: ? Feeling very thirsty. ? Your eyes making fewer tears. ? Not sweating when body temperature is high, such as in hot weather. ? Your body making very little pee.  Changes in vital signs, such as: ? Weak pulse. ? Pulse that is more than 100 beats a minute when you are sitting still. ? Fast breathing. ? Low blood pressure.  Other changes, such as: ? Sunken eyes. ? Cold hands and feet. ? Confusion. ? Lack of energy (lethargy). ? Trouble waking up from sleep. ? Short-term weight loss. ? Unconsciousness. Follow these instructions at home:  If told by your doctor, drink an ORS: ? Make an ORS by using instructions on the package. ? Start by drinking small amounts, about  cup (120 mL) every 5-10 minutes. ? Slowly drink more until you have had the amount that your doctor said to have.  Drink enough clear fluid to keep your pee clear or pale yellow. If you were told to drink an ORS, finish the ORS  first, then start slowly drinking clear fluids. Drink fluids such as: ? Water. Do not drink only water by itself. Doing that can make the salt (sodium) level in your body get too low (hyponatremia). ? Ice chips. ? Fruit juice that you have added water to (diluted). ? Low-calorie sports drinks.  Avoid: ? Alcohol. ? Drinks that have a lot of sugar. These include high-calorie sports drinks, fruit juice that does not have water added, and soda. ? Caffeine. ? Foods that are greasy or have a lot of fat or sugar.  Take over-the-counter and prescription medicines only as told by your doctor.  Do not take salt tablets. Doing that can make the salt level in your body get too high (hypernatremia).  Eat foods that have minerals (electrolytes). Examples include bananas, oranges, potatoes, tomatoes, and spinach.  Keep all follow-up visits as told by your doctor. This is important. Contact a doctor if:  You have belly (abdominal) pain that: ? Gets worse. ? Stays in one area (localizes).  You have a rash.  You have a stiff neck.  You get angry or annoyed more easily than normal (irritability).  You are more sleepy than normal.  You have a harder time waking up than normal.  You feel: ? Weak. ? Dizzy. ? Very thirsty.  You have peed (urinated) only a small amount of very dark pee during 6-8 hours. Get help right away if:  You have symptoms of   very bad dehydration.  You cannot drink fluids without throwing up (vomiting).  Your symptoms get worse with treatment.  You have a fever.  You have a very bad headache.  You are throwing up or having watery poop (diarrhea) and it: ? Gets worse. ? Does not go away.  You have blood or something green (bile) in your throw-up.  You have blood in your poop (stool). This may cause poop to look black and tarry.  You have not peed in 6-8 hours.  You pass out (faint).  Your heart rate when you are sitting still is more than 100 beats a  minute.  You have trouble breathing. This information is not intended to replace advice given to you by your health care provider. Make sure you discuss any questions you have with your health care provider. Document Released: 07/18/2009 Document Revised: 04/10/2016 Document Reviewed: 11/15/2015 Elsevier Interactive Patient Education  2018 [employer]vier Inc.  

## 2018-07-28 NOTE — Progress Notes (Signed)
  Radiation Oncology         [phone]) [phone] ________________________________  Name: Norma Colon MRN: [id number]  Date: 07/20/2018  DOB: [birth year]  End of Treatment Note  Diagnosis:   57 y.o. female with T3bN1-2 Adenocarcinoma of the rectum    Indication for treatment:  Curative       Radiation treatment dates:   06/13/2018 - 07/20/2018  Site/dose:   The rectum was initially treated to 45 Gy in 25 fractions, followed by a 5.4 Gy boost delivered in 3 fractions, to yield a final total dose of 50.4 Gy.  Beams/energy:   3D / 15X, 6X Photon  Narrative: The patient tolerated radiation treatment relatively well.  She experienced ongoing GI issues with bloating, abdominal cramping, gas, diarrhea, and rectal pain from diarrhea. She is taking multiple medications for these symptoms and will continue to receive IV fluids. She denies any skin breakdown within the treatment field.    Plan: The patient has completed radiation treatment. The patient will return to radiation oncology clinic for routine followup in one month. I advised them to call or return sooner if they have any questions or concerns related to their recovery or treatment.  ------------------------------------------------  Jodelle Gross, MD, PhD  This document serves as a record of services personally performed by Kyung Rudd, MD. It was created on his behalf by Rae Lips, a trained medical scribe. The creation of this record is based on the scribe's personal observations and the provider's statements to them. This document has been checked and approved by the attending provider.

## 2018-08-03 ENCOUNTER — Inpatient Hospital Stay: Payer: 59

## 2018-08-03 ENCOUNTER — Inpatient Hospital Stay (HOSPITAL_BASED_OUTPATIENT_CLINIC_OR_DEPARTMENT_OTHER): Payer: 59 | Admitting: Oncology

## 2018-08-03 VITALS — BP 130/68 | HR 81 | Temp 98.4°F | Resp 18 | Ht 69.0 in | Wt 181.5 lb

## 2018-08-03 DIAGNOSIS — C20 Malignant neoplasm of rectum: Secondary | ICD-10-CM

## 2018-08-03 DIAGNOSIS — Z720 Tobacco use: Secondary | ICD-10-CM | POA: Diagnosis not present

## 2018-08-03 DIAGNOSIS — Z79899 Other long term (current) drug therapy: Secondary | ICD-10-CM

## 2018-08-03 LAB — CMP (CANCER CENTER ONLY)
ALBUMIN: 3.5 g/dL (ref 3.5–5.0)
ALK PHOS: 106 U/L (ref 38–126)
ALT: 27 U/L (ref 0–44)
ANION GAP: 8 (ref 5–15)
AST: 21 U/L (ref 15–41)
BILIRUBIN TOTAL: 0.8 mg/dL (ref 0.3–1.2)
BUN: 13 mg/dL (ref 6–20)
CALCIUM: 9.5 mg/dL (ref 8.9–10.3)
CO2: 34 mmol/L — ABNORMAL HIGH (ref 22–32)
Chloride: 105 mmol/L (ref 98–111)
Creatinine: 0.97 mg/dL (ref 0.44–1.00)
GFR, Est AFR Am: >60 mL/min (ref >60)
GLUCOSE: 91 mg/dL (ref 70–99)
POTASSIUM: 4.1 mmol/L (ref 3.5–5.1)
Sodium: 147 mmol/L — ABNORMAL HIGH (ref 135–145)
Total Protein: 6.7 g/dL (ref 6.5–8.1)

## 2018-08-03 LAB — CBC WITH DIFFERENTIAL (CANCER CENTER ONLY)
ABS IMMATURE GRANULOCYTES: 0.04 10*3/uL (ref 0.00–0.07)
BASOS ABS: 0 10*3/uL (ref 0.0–0.1)
Basophils Relative: 1 %
EOS PCT: 6 %
Eosinophils Absolute: 0.4 10*3/uL (ref 0.0–0.5)
HEMATOCRIT: 33.8 % — AB (ref 36.0–46.0)
Hemoglobin: 11 g/dL — ABNORMAL LOW (ref 12.0–15.0)
Immature Granulocytes: 1 %
LYMPHS ABS: 0.5 10*3/uL — AB (ref 0.7–4.0)
Lymphocytes Relative: 7 %
MCH: 32.6 pg (ref 26.0–34.0)
MCHC: 32.5 g/dL (ref 30.0–36.0)
MCV: 100.3 fL — ABNORMAL HIGH (ref 80.0–100.0)
MONO ABS: 0.7 10*3/uL (ref 0.1–1.0)
Monocytes Relative: 11 %
NEUTROS ABS: 4.8 10*3/uL (ref 1.7–7.7)
Neutrophils Relative %: 74 %
Platelet Count: 211 10*3/uL (ref 150–400)
RBC: 3.37 MIL/uL — AB (ref 3.87–5.11)
RDW: 18.7 % — ABNORMAL HIGH (ref 11.5–15.5)
WBC: 6.4 10*3/uL (ref 4.0–10.5)
nRBC: 0 % (ref 0.0–0.2)

## 2018-08-03 MED ORDER — FLUCONAZOLE 100 MG PO TABS: 100.0000 mg | ORAL_TABLET | Freq: Every day | ORAL | 0 refills | Status: DC

## 2018-08-03 NOTE — Progress Notes (Signed)
Patient expresses anger and frustration in regards to her cancer care and how she feels she has not been managed properly in her opinion.

## 2018-08-03 NOTE — Progress Notes (Signed)
Sent in-basket message to Dr. Marcello Moores and her nurse requesting appointment to discuss surgical plan.

## 2018-08-03 NOTE — Progress Notes (Signed)
  [place] OFFICE PROGRESS NOTE   Diagnosis: Rectal cancer  INTERVAL HISTORY:   Ms. Courser returns as scheduled.  She completed Xeloda and radiation 07/20/2018.  She was seen in the symptom management clinic on 07/18/2018 with diarrhea.  She was treated with IV fluids and prescribed Lomotil.  A stool C. difficile toxin was negative. She reports the diarrhea resolved as of 07/26/2018.  No mouth sores or hand/foot pain.  Objective:  Vital signs in last 24 hours:  Blood pressure 130/68, pulse 81, temperature 98.4 F (36.9 C), temperature source Oral, resp. rate 18, height 5\' 9"  (1.753 m), weight 181 lb 8 oz (82.3 kg), SpO2 100 %.    HEENT: Thrush over the tongue, no ulcers Resp: Lungs clear bilaterally Cardio: Regular rate and rhythm GI: Soft and nontender, no hepatomegaly Vascular: No leg edema  Skin: Dryness of the palms and soles, no erythema or skin breakdown  Lab Results:  Lab Results  Component Value Date   WBC 6.4 08/03/2018   HGB 11.0 (L) 08/03/2018   HCT 33.8 (L) 08/03/2018   MCV 100.3 (H) 08/03/2018   PLT 211 08/03/2018   NEUTROABS 4.8 08/03/2018    CMP  Lab Results  Component Value Date   NA 140 07/18/2018   K 3.6 07/18/2018   CL 105 07/18/2018   CO2 26 07/18/2018   GLUCOSE 94 07/18/2018   BUN 15 07/18/2018   CREATININE 0.81 07/18/2018   CALCIUM 9.3 07/18/2018   PROT 6.5 07/18/2018   ALBUMIN 3.5 07/18/2018   AST 84 (H) 07/18/2018   ALT 176 (H) 07/18/2018   ALKPHOS 104 07/18/2018   BILITOT 1.5 (H) 07/18/2018   GFRNONAA >60 07/18/2018   GFRAA >60 07/18/2018     Medications: I have reviewed the patient's current medications.   Assessment/Plan:  1. Rectal cancer ? 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 CTs 05/24/2018-mass beginning at proximal he 7.6 cm from the anal verge, "20 "perirectal lymph nodes,  no evidence of metastatic disease ? Pelvic MRI 06/01/2018-tumor measured at 10.8 cm from the anal sphincter, T3b, N1-2 left perirectal lymph nodes, each measuring 5 mm ? Radiation and Xeloda initiated 06/13/2018, completed 07/20/2018 2. Tobacco use 3. 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 Migraine headaches 5. Restless legs   Disposition: Ms. Tome has completed neoadjuvant Xeloda/radiation.  The treatment was complicated by abdominal cramping and diarrhea.  This has resolved.  Elevated liver enzymes on 07/18/2018 have normalized.  Ms. Mill will be referred to Dr. Marcello Moores for surgical planning.  She will return for an office visit in 3 weeks.  Betsy Coder, MD  08/03/2018  11:13 AM

## 2018-08-04 ENCOUNTER — Telehealth: Payer: Self-pay | Admitting: Oncology

## 2018-08-04 NOTE — Telephone Encounter (Signed)
Scheduled appt per 10/30 los - pt aware of appt date and time.

## 2018-08-09 ENCOUNTER — Ambulatory Visit: Payer: Self-pay | Admitting: General Surgery

## 2018-08-09 NOTE — H&P (Signed)
History of Present Illness Leighton Ruff MD; 43/12/2949 10:19 AM) The patient is a 57 year old female who presents with colorectal cancer. 57yo F who I saw in March for rectal bleeding. I recommended that she get a colonoscopy due to her symptoms and age. She eventually saw her gastroenterologist and this was completed. A mass was noted at 10 cm and tattooed. biopsies showed adenocarcinoma. CEA is WNL's. CT scans of chest, abdomen and pelvis show possible localized lymphadenopathy but no signs of metastatic disease. Pt denied any weight loss or change in bowel habits. No PSH. She has now completed neoadjuvant radiation and chemotherapy with final treatment on October 16. She is ready to discuss surgery.   Past Surgical History (Tanisha A. Owens Shark, [place]; 08/09/2018 9:55 AM) Colon Polyp Removal - Colonoscopy Colon Polyp Removal - Open Shoulder Surgery Bilateral, Right.  Diagnostic Studies History Leighton Ruff, MD; 88/01/1659 10:20 AM) Colonoscopy within last year Mammogram within last year  Allergies (Tanisha A. Owens Shark, [place]; 08/09/2018 9:55 AM) Codeine Phosphate *ANALGESICS - OPIOID* Allergies Reconciled  Medication History (Tanisha A. Owens Shark, [place]; 08/09/2018 9:55 AM) Calcium-Vitamin D (600MG  Tablet Chewable, Oral) Active. Magnesium (100MG  Tablet, Oral) Active. Fiber (Oral) Active. Hydrocodone-Acetaminophen (10-325MG  Tablet, Oral) Active. ALPRAZolam (2MG  Tablet, Oral) Active. BuPROPion HCl ER (XL) (300MG  Tablet ER 24HR, Oral) Active. Cyclobenzaprine HCl (10MG  Tablet, Oral) Active. ROPINIRole HCl (0.25MG  Tablet, Oral) Active. Medications Reconciled  Social History (Tanisha A. Owens Shark, [place]; 08/09/2018 9:55 AM) Alcohol use Occasional alcohol use. Caffeine use Carbonated beverages, Coffee, Tea. Illicit drug use Prefer to discuss with provider. Tobacco use Current every day smoker.  Family History (Tanisha A. Owens Shark, [place]; 08/09/2018 9:55 AM) Arthritis Mother. Cancer  Father. Colon Polyps Father. Depression Father, Mother, Sister. Hypertension Father. Melanoma Father. Migraine Headache Mother, Sister. Respiratory Condition Father.  Pregnancy / Birth History (Tanisha A. Owens Shark, [place]; 08/09/2018 9:55 AM) Age at menarche 35 years. Age of menopause 51-55 Contraceptive History Contraceptive implant, Oral contraceptives. Gravida 1 Maternal age 76-30 Para 1  Other Problems (Tanisha A. Owens Shark, [place]; 08/09/2018 9:55 AM) Anxiety Disorder Arthritis Back Pain Cholelithiasis Colon Cancer Depression Emphysema Of Lung Hemorrhoids Migraine Headache     Review of Systems (Tanisha A. Brown RMA; 08/09/2018 9:55 AM) General Present- Weight Gain. Not Present- Appetite Loss, Chills, Fatigue, Fever, Night Sweats and Weight Loss. Skin Present- Dryness. Not Present- Change in Wart/Mole, Hives, Jaundice, New Lesions, Non-Healing Wounds, Rash and Ulcer. HEENT Not Present- Earache, Hearing Loss, Hoarseness, Nose Bleed, Oral Ulcers, Ringing in the Ears, Seasonal Allergies, Sinus Pain, Sore Throat, Visual Disturbances, Wears glasses/contact lenses and Yellow Eyes. Respiratory Present- Snoring. Not Present- Bloody sputum, Chronic Cough, Difficulty Breathing and Wheezing. Breast Not Present- Breast Mass, Breast Pain, Nipple Discharge and Skin Changes. Cardiovascular Present- Leg Cramps. Not Present- Chest Pain, Difficulty Breathing Lying Down, Palpitations, Rapid Heart Rate, Shortness of Breath and Swelling of Extremities. Gastrointestinal Present- Bloating, Bloody Stool and Hemorrhoids. Not Present- Abdominal Pain, Change in Bowel Habits, Chronic diarrhea, Constipation, Difficulty Swallowing, Excessive gas, Gets full quickly at meals, Indigestion, Nausea, Rectal Pain and Vomiting. Female Genitourinary Not Present- Frequency, Nocturia, Painful Urination, Pelvic Pain and Urgency. Musculoskeletal Present- Back Pain. Not Present- Joint Pain, Joint Stiffness,  Muscle Pain, Muscle Weakness and Swelling of Extremities. Neurological Present- Headaches and Numbness. Not Present- Decreased Memory, Fainting, Seizures, Tingling, Tremor, Trouble walking and Weakness. Psychiatric Present- Anxiety and Depression. Not Present- Bipolar, Change in Sleep Pattern, Fearful and Frequent crying. Endocrine Present- Hair Changes. Not Present- Cold Intolerance, Excessive Hunger, Heat Intolerance, Hot flashes  and New Diabetes.  Vitals (Tanisha A. Brown RMA; 08/09/2018 9:55 AM) 08/09/2018 9:55 AM Weight: 180.2 lb Height: 69in Body Surface Area: 1.98 m Body Mass Index: 26.61 kg/m  Temp.: 97.21F  Pulse: 98 (Regular)  BP: 126/88 (Sitting, Left Arm, Standard)      Physical Exam Leighton Ruff MD; 32/03/7123 10:19 AM)  General Mental Status-Alert. General Appearance-Not in acute distress. Build & Nutrition-Well nourished. Posture-Normal posture. Gait-Normal.  Head and Neck Head-normocephalic, atraumatic with no lesions or palpable masses. Trachea-midline.  Chest and Lung Exam Chest and lung exam reveals -quiet, even and easy respiratory effort with no use of accessory muscles and on auscultation, normal breath sounds, no adventitious sounds and normal vocal resonance.  Cardiovascular Cardiovascular examination reveals -normal heart sounds, regular rate and rhythm with no murmurs.  Abdomen Inspection Inspection of the abdomen reveals - No Hernias. Palpation/Percussion Palpation and Percussion of the abdomen reveal - Soft, Non Tender, No Rigidity (guarding), No hepatosplenomegaly and No Palpable abdominal masses.  Neurologic Neurologic evaluation reveals -alert and oriented x 3 with no impairment of recent or remote memory, normal attention span and ability to concentrate, normal sensation and normal coordination.  Musculoskeletal Normal Exam - Bilateral-Upper Extremity Strength Normal and Lower Extremity Strength  Normal.    Assessment & Plan Leighton Ruff MD; 58/0/9983 10:17 AM)  RECTAL CANCER (C20) Impression: 57 year old female with a proximal rectal cancer here for preoperative evaluation. She has completed neoadjuvant chemotherapy and radiation and is ready to proceed with surgery. We have discussed the robotic low anterior resection. She has been tattooed. I do not think she will need a diverting colostomy for this. We have counseled her on the risk of smoking and what that was due to her risk of anastomotic breakdown. She would like to try to quit smoking prior to surgery. The surgery and anatomy were described to the patient as well as the risks of surgery and the possible complications. These include: Bleeding, deep abdominal infections and possible wound complications such as hernia and infection, damage to adjacent structures, leak of surgical connections, which can lead to other surgeries and possibly an ostomy, possible need for other procedures, such as abscess drains in radiology, possible prolonged hospital stay, possible diarrhea from removal of part of the colon, possible constipation from narcotics, possible bowel, bladder or sexual dysfunction if having rectal surgery, prolonged fatigue/weakness or appetite loss, possible early recurrence of of disease, possible complications of their medical problems such as heart disease or arrhythmias or lung problems, death (less than 1%). I believe the patient understands and wishes to proceed with the surgery.

## 2018-08-10 ENCOUNTER — Telehealth: Payer: Self-pay | Admitting: *Deleted

## 2018-08-10 NOTE — Telephone Encounter (Signed)
Patient reports Dr. Manon Hilding surgical calendar is full for December. She is asking for Dr. Benay Spice to "see what he can do" in regards to talking with Dr. Marcello Moores. Would she do surgery the end of November. Her concern is that if surgery is not done until January she will need to pay a $6000 deductible. Message forwarded to Dr. Benay Spice.

## 2018-08-22 ENCOUNTER — Telehealth: Payer: Self-pay | Admitting: Nurse Practitioner

## 2018-08-22 ENCOUNTER — Encounter: Payer: Self-pay | Admitting: Radiation Oncology

## 2018-08-22 ENCOUNTER — Telehealth: Payer: Self-pay | Admitting: Radiation Oncology

## 2018-08-22 ENCOUNTER — Inpatient Hospital Stay: Payer: 59 | Admitting: Nurse Practitioner

## 2018-08-22 ENCOUNTER — Other Ambulatory Visit: Payer: Self-pay

## 2018-08-22 ENCOUNTER — Telehealth: Payer: Self-pay | Admitting: *Deleted

## 2018-08-22 ENCOUNTER — Inpatient Hospital Stay: Payer: 59

## 2018-08-22 ENCOUNTER — Ambulatory Visit
Admission: RE | Admit: 2018-08-22 | Discharge: 2018-08-22 | Disposition: A | Discharge status: Home / Self Care | Payer: 59 | Source: Ambulatory Visit | Attending: Radiation Oncology | Admitting: Radiation Oncology

## 2018-08-22 VITALS — BP 135/90 | HR 92 | Temp 98.3°F | Resp 20 | Ht 69.0 in | Wt 180.8 lb

## 2018-08-22 DIAGNOSIS — C20 Malignant neoplasm of rectum: Secondary | ICD-10-CM

## 2018-08-22 DIAGNOSIS — Z79899 Other long term (current) drug therapy: Secondary | ICD-10-CM | POA: Insufficient documentation

## 2018-08-22 LAB — CBC WITH DIFFERENTIAL (CANCER CENTER ONLY)
Abs Immature Granulocytes: 0.03 10*3/uL (ref 0.00–0.07)
BASOS ABS: 0 10*3/uL (ref 0.0–0.1)
Basophils Relative: 1 %
Eosinophils Absolute: 0.2 10*3/uL (ref 0.0–0.5)
Eosinophils Relative: 4 %
HEMATOCRIT: 37.1 % (ref 36.0–46.0)
HEMOGLOBIN: 12.2 g/dL (ref 12.0–15.0)
IMMATURE GRANULOCYTES: 1 %
LYMPHS ABS: 0.5 10*3/uL — AB (ref 0.7–4.0)
LYMPHS PCT: 9 %
MCH: 33.1 pg (ref 26.0–34.0)
MCHC: 32.9 g/dL (ref 30.0–36.0)
MCV: 100.5 fL — ABNORMAL HIGH (ref 80.0–100.0)
Monocytes Absolute: 0.6 10*3/uL (ref 0.1–1.0)
Monocytes Relative: 11 %
NEUTROS PCT: 74 %
NRBC: 0 % (ref 0.0–0.2)
Neutro Abs: 4.1 10*3/uL (ref 1.7–7.7)
Platelet Count: 228 10*3/uL (ref 150–400)
RBC: 3.69 MIL/uL — ABNORMAL LOW (ref 3.87–5.11)
RDW: 15.9 % — ABNORMAL HIGH (ref 11.5–15.5)
WBC Count: 5.4 10*3/uL (ref 4.0–10.5)

## 2018-08-22 NOTE — Telephone Encounter (Signed)
LM for the patient to call me back to review her course since she missed her appt today.

## 2018-08-22 NOTE — Progress Notes (Signed)
Radiation Oncology         [phone]) [phone] ________________________________  Name: Norma Colon MRN: [id number]  Date of Service: 08/22/2018 DOB: [birth year]  Post Treatment Note  CC: Dian Queen, MD  Dian Queen, MD  Diagnosis:  [phone] Adenocarcinoma of the rectum    Interval Since Last Radiation:  5 weeks   06/13/2018 - 07/20/2018: The rectum was initially treated to 45 Gy in 25 fractions, followed by a 5.4 Gy boost delivered in 3 fractions, to yield a final total dose of 50.4 Gy.  Narrative:  The patient returns today for routine follow-up.  She did have difficulty with abdominal fullness, bloating, gas, diarrhea, and rectal pain. She did not have any skin breakdown with treatment. She was on multiple agents to help her symptomatology.                           On review of systems, the patient states she is feeling much better since completing radiotherapy. She has not had diarrhea in almost a whole week and has not taken lomotil since. She reports resolution of her abdominal bloating and pain. She has had two instances of hematochezia but reports this is only slight over the last week. She denies rectal urgency but does have urinary urgency and frequency due to trying to increase oral intake of fluids. She reports clear urine and denies any fevers. No other complaints are noted.  ALLERGIES:  is allergic to codeine.  Meds: Current Outpatient Medications  Medication Sig Dispense Refill  . alprazolam (XANAX) 2 MG tablet Take 2 mg by mouth daily.    [name] aspirin 81 MG tablet Take 81 mg by mouth daily.    [name] buPROPion (WELLBUTRIN XL) 300 MG 24 hr tablet Take 300 mg by mouth daily.    . Calcium Carb-Cholecalciferol (CALCIUM 600+D3 PO) Take by mouth daily.    . Calcium Citrate-Vitamin D 315-250 MG-UNIT TABS Take by mouth.    . Calcium-Magnesium-Vitamin D [id number] MG-MG-UNIT TB24 Take 1 tablet by mouth daily.    . cyclobenzaprine (FLEXERIL) 10 MG tablet Take 10 mg by mouth 3  (three) times daily as needed for muscle spasms.    [name] FIBER PO Take by mouth every morning.     [name] HYDROcodone-acetaminophen (NORCO) 10-325 MG tablet Take 1 tablet by mouth every 4 (four) hours as needed.    . magnesium oxide (MAG-OX) 400 MG tablet Take 500 mg by mouth daily.     . Multiple Vitamin (MULTIVITAMIN WITH MINERALS) TABS tablet Take 1 tablet by mouth daily.    . Multiple Vitamins-Iron (MULTIVITAMIN/IRON PO) Take by mouth.    [name] rOPINIRole (REQUIP) 0.25 MG tablet Take 0.25 mg by mouth at bedtime.    . diphenoxylate-atropine (LOMOTIL) 2.5-0.025 MG tablet 1 to 2 PO QID prn diarrhea (Patient not taking: Reported on 08/22/2018) 40 tablet 1  . fluconazole (DIFLUCAN) 100 MG tablet Take 1 tablet (100 mg total) by mouth daily. Take one daily x 5 days (Patient not taking: Reported on 08/22/2018) 5 tablet 0  . hyoscyamine (LEVSIN) 0.125 MG/ML solution Take 1 mL (0.125 mg total) by mouth every 4 (four) hours as needed. (Patient not taking: Reported on 08/22/2018) 30 mL 1   No current facility-administered medications for this encounter.     Physical Findings:  height is 5\' 9"  (1.753 m) and weight is 180 lb 12.8 oz (82 kg). Her oral temperature is 98.3 F (36.8 C). Her blood pressure is 135/90  and her pulse is 92. Her respiration is 20 and oxygen saturation is 99%.  Pain Assessment Pain Score: 9  Pain Loc: Head/10 In general this is a well appearing caucasian female in no acute distress. She's alert and oriented x4 and appropriate throughout the examination. Cardiopulmonary assessment is negative for acute distress and she exhibits normal effort.   Lab Findings: Lab Results  Component Value Date   WBC 5.4 08/22/2018   HGB 12.2 08/22/2018   HCT 37.1 08/22/2018   MCV 100.5 (H) 08/22/2018   PLT 228 08/22/2018     Radiographic Findings: No results found.  Impression/Plan: 1. 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 The patient is doing well since completion of her radiotherapy. She is  scheduled to proceed with robotic LAR on 09/16/18 with Dr. Marcello Moores. We will follow along in GI conference with her results and see her back as needed.  2. Rectal bleeding. The patient's symptoms sound to be minimal in her report but I encouraged her to call us if this becomes more frequent. We could consider anusol suppositories as well.  3. Concerns with dehydration. Clinically she sounds like she's doing well with this and is pushing oral hydration. We will follow this expectantly. She has some labs pending this am and we will follow up with this when available. 4. Desires Tobacco Cessation. She is going to taper her cigarettes over the next two weeks with the hopes of quitting before her surgery. We have encouraged her in this and discussed patches and nicotine gum as a means of cessation.    Carola Rhine, PAC

## 2018-08-22 NOTE — Telephone Encounter (Signed)
Pt appt canceled due to no/show

## 2018-08-22 NOTE — Progress Notes (Deleted)
  [facility] OFFICE PROGRESS NOTE   Diagnosis: Rectal cancer  INTERVAL HISTORY:   [name] returns as scheduled.  Objective:  Vital signs in last 24 hours:  There were no vitals taken for this visit.    HEENT: *** Lymphatics: *** Resp: *** Cardio: *** GI: *** Vascular: *** Neuro: ***  Skin: ***    Lab Results:  Lab Results  Component Value Date   WBC 5.4 08/22/2018   HGB 12.2 08/22/2018   HCT 37.1 08/22/2018   MCV 100.5 (H) 08/22/2018   PLT 228 08/22/2018   NEUTROABS 4.1 08/22/2018    Imaging:  No results found.  Medications: I have reviewed the patient's current medications.  Assessment/Plan: 1. Rectal cancer ? 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 CTs 05/24/2018-mass beginning at proximal he 7.6 cm from the anal verge, "20 "perirectal lymph nodes, no evidence of metastatic disease ? Pelvic MRI 06/01/2018-tumor measured at 10.8 cm from the anal sphincter, T3b, N1-2 left perirectal lymph nodes, each measuring 5 mm ? Radiation and Xeloda initiated 06/13/2018, completed 07/20/2018 2. Tobacco use 3. 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 Migraine headaches 5. Restless legs  Disposition:    Norma Colon ANP/GNP-BC   08/22/2018  10:42 AM

## 2018-08-22 NOTE — Telephone Encounter (Signed)
TC to [place] in [employer]. Pt was seen by Anastasio Auerbach, PA today and was to be seen by Ned Card, NP afterwards.  Pt did not arrive to [place].  Spoke with shurley. She states pt has left for an appt @ [facility].

## 2018-08-22 NOTE — Addendum Note (Signed)
Encounter addended by: Malena Edman, RN on: 08/22/2018 11:05 AM  Actions taken: Charge Capture section accepted

## 2018-08-25 ENCOUNTER — Telehealth: Payer: Self-pay | Admitting: *Deleted

## 2018-08-25 NOTE — Telephone Encounter (Signed)
Called to request copy of her pathology report that showed her rectal cancer (05/17/18). Needs it for her insurance claim. Copy of path report put in mail.

## 2018-08-30 ENCOUNTER — Telehealth: Payer: Self-pay | Admitting: *Deleted

## 2018-08-30 MED ORDER — PROCHLORPERAZINE MALEATE 10 MG PO TABS: 10.0000 mg | ORAL_TABLET | Freq: Four times a day (QID) | ORAL | 0 refills | Status: DC | PRN

## 2018-08-30 NOTE — Telephone Encounter (Signed)
Called to request anti-emetic be called into [facility] on [address].

## 2018-09-05 NOTE — Patient Instructions (Addendum)
Norma Colon  09/05/2018   Your procedure is scheduled on: 09-16-18  Report to [facility] Main  Entrance  Report to admitting at 730  AM    Call this number if you have problems the morning of surgery [phone]   Remember: Do not eat food or drink liquids :After Midnight. BRUSH YOUR TEETH MORNING OF SURGERY AND RINSE YOUR MOUTH OUT, NO CHEWING GUM CANDY OR MINTS.  NO SOLID FOOD AFTER MIDNIGHT Wednesday NIGHT, CLEAR LIQUIDS WITH BOWEL PREP ON Thursday 09-15-18. FOLLOW ALL DR Marcello Moores BOWEL PREP INSTRUCTIONS. [name] DRINK 2 PRESURGERY ENSURE DRINKS THE NIGHT BEFORE SURGERY AT  1000 PM AND 1 PRESURGERY DRINK THE DAY OF THE PROCEDURE 3 HOURS PRIOR TO SCHEDULED SURGERY. NO SOLIDS AFTER MIDNIGHT THE DAY PRIOR TO THE SURGERY. NOTHING BY MOUTH EXCEPT CLEAR LIQUIDS UNTIL THREE HOURS PRIOR TO SCHEDULED SURGERY. PLEASE FINISH PRESURGERY ENSURE DRINK PER SURGEON ORDER 3 HOURS PRIOR TO SCHEDULED SURGERY TIME WHICH NEEDS TO BE COMPLETED AT _________.   CLEAR LIQUID DIET   Foods Allowed                                                                     Foods Excluded  Coffee and tea, regular and decaf                             liquids that you cannot  Plain Jell-O in any flavor                                             see through such as: Fruit ices (not with fruit pulp)                                     milk, soups, orange juice  Iced Popsicles                                    All solid food Carbonated beverages, regular and diet                                    Cranberry, grape and apple juices Sports drinks like Gatorade Lightly seasoned clear broth or consume(fat free) Sugar, honey syrup  Sample Menu Breakfast                                Lunch                                     Supper Cranberry juice                    Beef broth  Chicken broth Jell-O                                     Grape juice                           Apple  juice Coffee or tea                        Jell-O                                      Popsicle                                                Coffee or tea                        Coffee or tea  _____________________________________________________________________    Take these medicines the morning of surgery with A SIP OF WATER:  HYDROCODNE IF NEEDED, ALPRAZOLAM (XANAX), BUPROPION [facility])                                You may not have any metal on your body including hair pins and              piercings  Do not wear jewelry, make-up, lotions, powders or perfumes, deodorant             Do not wear nail polish.  Do not shave  48 hours prior to surgery.                Do not bring valuables to the hospital. [place].  Contacts, dentures or bridgework may not be worn into surgery.  Leave suitcase in the car. After surgery it may be brought to your room.                  Please read over the following fact sheets you were given: _____________________________________________________________________  [facility] - Preparing for Surgery Before surgery, you can play an important role.  Because skin is not sterile, your skin needs to be as free of germs as possible.  You can reduce the number of germs on your skin by washing with CHG (chlorahexidine gluconate) soap before surgery.  CHG is an antiseptic cleaner which kills germs and bonds with the skin to continue killing germs even after washing. Please DO NOT use if you have an allergy to CHG or antibacterial soaps.  If your skin becomes reddened/irritated stop using the CHG and inform your nurse when you arrive at Short Stay. Do not shave (including legs and underarms) for at least 48 hours prior to the first CHG shower.  You may shave your face/neck. Please follow these instructions carefully:  1.  Shower with CHG Soap the night before surgery and the  morning of Surgery.  2.  If you  choose to wash your hair, wash your hair first as usual with  your  normal  shampoo.  3.  After you shampoo, rinse your hair and body thoroughly to remove the  shampoo.                           4.  Use CHG as you would any other liquid soap.  You can apply chg directly  to the skin and wash                       Gently with a scrungie or clean washcloth.  5.  Apply the CHG Soap to your body ONLY FROM THE NECK DOWN.   Do not use on face/ open                           Wound or open sores. Avoid contact with eyes, ears mouth and genitals (private parts).                       Wash face,  Genitals (private parts) with your normal soap.             6.  Wash thoroughly, paying special attention to the area where your surgery  will be performed.  7.  Thoroughly rinse your body with warm water from the neck down.  8.  DO NOT shower/wash with your normal soap after using and rinsing off  the CHG Soap.                9.  Pat yourself dry with a clean towel.            10.  Wear clean pajamas.            11.  Place clean sheets on your bed the night of your first shower and do not  sleep with pets. Day of Surgery : Do not apply any lotions/deodorants the morning of surgery.  Please wear clean clothes to the hospital/surgery center.  FAILURE TO FOLLOW THESE INSTRUCTIONS MAY RESULT IN THE CANCELLATION OF YOUR SURGERY PATIENT SIGNATURE_________________________________  NURSE SIGNATURE__________________________________  ________________________________________________________________________   [name]  An incentive spirometer is a tool that can help keep your lungs clear and active. This tool measures how well you are filling your lungs with each breath. Taking long deep breaths may help reverse or decrease the chance of developing breathing (pulmonary) problems (especially infection) following:  A long period of time when you are unable to move or be active. BEFORE THE PROCEDURE   If  the spirometer includes an indicator to show your best effort, your nurse or respiratory therapist will set it to a desired goal.  If possible, sit up straight or lean slightly forward. Try not to slouch.  Hold the incentive spirometer in an upright position. INSTRUCTIONS FOR USE  1. Sit on the edge of your bed if possible, or sit up as far as you can in bed or on a chair. 2. Hold the incentive spirometer in an upright position. 3. Breathe out normally. 4. Place the mouthpiece in your mouth and seal your lips tightly around it. 5. Breathe in slowly and as deeply as possible, raising the piston or the ball toward the top of the column. 6. Hold your breath for 3-5 seconds or for as long as possible. Allow the piston or ball to fall to the bottom of the column. 7. Remove the mouthpiece from your mouth  and breathe out normally. 8. Rest for a few seconds and repeat Steps 1 through 7 at least 10 times every 1-2 hours when you are awake. Take your time and take a few normal breaths between deep breaths. 9. The spirometer may include an indicator to show your best effort. Use the indicator as a goal to work toward during each repetition. 10. After each set of 10 deep breaths, practice coughing to be sure your lungs are clear. If you have an incision (the cut made at the time of surgery), support your incision when coughing by placing a pillow or rolled up towels firmly against it. Once you are able to get out of bed, walk around indoors and cough well. You may stop using the incentive spirometer when instructed by your caregiver.  RISKS AND COMPLICATIONS  Take your time so you do not get dizzy or light-headed.  If you are in pain, you may need to take or ask for pain medication before doing incentive spirometry. It is harder to take a deep breath if you are having pain. AFTER USE  Rest and breathe slowly and easily.  It can be helpful to keep track of a log of your progress. Your caregiver can  provide you with a simple table to help with this. If you are using the spirometer at home, follow these instructions: [place] IF:   You are having difficultly using the spirometer.  You have trouble using the spirometer as often as instructed.  Your pain medication is not giving enough relief while using the spirometer.  You develop fever of 100.5 F (38.1 C) or higher. SEEK IMMEDIATE MEDICAL CARE IF:   You cough up bloody sputum that had not been present before.  You develop fever of 102 F (38.9 C) or greater.  You develop worsening pain at or near the incision site. MAKE SURE YOU:   Understand these instructions.  Will watch your condition.  Will get help right away if you are not doing well or get worse. Document Released: 02/01/2007 Document Revised: 12/14/2011 Document Reviewed: 04/04/2007 ExitCare Patient Information 2014 ExitCare, [place].   ________________________________________________________________________  WHAT IS A BLOOD TRANSFUSION? Blood Transfusion Information  A transfusion is the replacement of blood or some of its parts. Blood is made up of multiple cells which provide different functions.  Red blood cells carry oxygen and are used for blood loss replacement.  White blood cells fight against infection.  Platelets control bleeding.  Plasma helps clot blood.  Other blood products are available for specialized needs, such as hemophilia or other clotting disorders. BEFORE THE TRANSFUSION  Who gives blood for transfusions?   Healthy volunteers who are fully evaluated to make sure their blood is safe. This is blood bank blood. Transfusion therapy is the safest it has ever been in the practice of medicine. Before blood is taken from a donor, a complete history is taken to make sure that person has no history of diseases nor engages in risky social behavior (examples are intravenous drug use or sexual activity with multiple partners). The  donor's travel history is screened to minimize risk of transmitting infections, such as malaria. The donated blood is tested for signs of infectious diseases, such as HIV and hepatitis. The blood is then tested to be sure it is compatible with you in order to minimize the chance of a transfusion reaction. If you or a relative donates blood, this is often done in anticipation of surgery and is not appropriate  for emergency situations. It takes many days to process the donated blood. RISKS AND COMPLICATIONS Although transfusion therapy is very safe and saves many lives, the main dangers of transfusion include:   Getting an infectious disease.  Developing a transfusion reaction. This is an allergic reaction to something in the blood you were given. Every precaution is taken to prevent this. The decision to have a blood transfusion has been considered carefully by your caregiver before blood is given. Blood is not given unless the benefits outweigh the risks. AFTER THE TRANSFUSION  Right after receiving a blood transfusion, you will usually feel much better and more energetic. This is especially true if your red blood cells have gotten low (anemic). The transfusion raises the level of the red blood cells which carry oxygen, and this usually causes an energy increase.  The nurse administering the transfusion will monitor you carefully for complications. HOME CARE INSTRUCTIONS  No special instructions are needed after a transfusion. You may find your energy is better. Speak with your caregiver about any limitations on activity for underlying diseases you may have. SEEK MEDICAL CARE IF:   Your condition is not improving after your transfusion.  You develop redness or irritation at the intravenous (IV) site. SEEK IMMEDIATE MEDICAL CARE IF:  Any of the following symptoms occur over the next 12 hours:  Shaking chills.  You have a temperature by mouth above 102 F (38.9 C), not controlled by  medicine.  Chest, back, or muscle pain.  People around you feel you are not acting correctly or are confused.  Shortness of breath or difficulty breathing.  Dizziness and fainting.  You get a rash or develop hives.  You have a decrease in urine output.  Your urine turns a dark color or changes to pink, red, or brown. Any of the following symptoms occur over the next 10 days:  You have a temperature by mouth above 102 F (38.9 C), not controlled by medicine.  Shortness of breath.  Weakness after normal activity.  The white part of the eye turns yellow (jaundice).  You have a decrease in the amount of urine or are urinating less often.  Your urine turns a dark color or changes to pink, red, or brown. Document Released: 09/18/2000 Document Revised: 12/14/2011 Document Reviewed: 05/07/2008 [facility] Patient Information 2014 [place], [place].  _______________________________________________________________________

## 2018-09-09 ENCOUNTER — Encounter (HOSPITAL_COMMUNITY)
Admission: RE | Admit: 2018-09-09 | Discharge: 2018-09-09 | Disposition: A | Discharge status: Home / Self Care | Payer: 59 | Source: Ambulatory Visit | Attending: General Surgery | Admitting: General Surgery

## 2018-09-09 ENCOUNTER — Encounter (HOSPITAL_COMMUNITY): Payer: Self-pay

## 2018-09-09 ENCOUNTER — Telehealth: Payer: Self-pay | Admitting: *Deleted

## 2018-09-09 ENCOUNTER — Other Ambulatory Visit: Payer: Self-pay

## 2018-09-09 DIAGNOSIS — Z01818 Encounter for other preprocedural examination: Secondary | ICD-10-CM | POA: Insufficient documentation

## 2018-09-09 DIAGNOSIS — C20 Malignant neoplasm of rectum: Secondary | ICD-10-CM | POA: Insufficient documentation

## 2018-09-09 HISTORY — DX: Headache, unspecified: R51.9

## 2018-09-09 HISTORY — DX: Restless legs syndrome: G25.81

## 2018-09-09 HISTORY — DX: Malignant (primary) neoplasm, unspecified: C80.1

## 2018-09-09 HISTORY — DX: Headache: R51

## 2018-09-09 LAB — BASIC METABOLIC PANEL
Anion gap: 9 (ref 5–15)
BUN: 19 mg/dL (ref 6–20)
CO2: 28 mmol/L (ref 22–32)
Calcium: 9.7 mg/dL (ref 8.9–10.3)
Chloride: 105 mmol/L (ref 98–111)
Creatinine, Ser: 1.22 mg/dL — ABNORMAL HIGH (ref 0.44–1.00)
GFR calc Af Amer: 58 mL/min — ABNORMAL LOW (ref >60)
GFR calc non Af Amer: 50 mL/min — ABNORMAL LOW (ref >60)
Glucose, Bld: 86 mg/dL (ref 70–99)
Potassium: 4.7 mmol/L (ref 3.5–5.1)
Sodium: 142 mmol/L (ref 135–145)

## 2018-09-09 LAB — CBC
HCT: 40.1 % (ref 36.0–46.0)
Hemoglobin: 12.9 g/dL (ref 12.0–15.0)
MCH: 32.9 pg (ref 26.0–34.0)
MCHC: 32.2 g/dL (ref 30.0–36.0)
MCV: 102.3 fL — ABNORMAL HIGH (ref 80.0–100.0)
PLATELETS: 262 10*3/uL (ref 150–400)
RBC: 3.92 MIL/uL (ref 3.87–5.11)
RDW: 14.7 % (ref 11.5–15.5)
WBC: 5.3 10*3/uL (ref 4.0–10.5)
nRBC: 0 % (ref 0.0–0.2)

## 2018-09-09 LAB — ABO/RH: ABO/RH(D): A POS

## 2018-09-09 NOTE — Progress Notes (Signed)
BMP done 09/09/2018 routed via epic to dr Leighton Ruff.

## 2018-09-09 NOTE — Progress Notes (Signed)
BMP done 09/09/2018 routed via epic to DR Leighton Ruff.

## 2018-09-09 NOTE — Telephone Encounter (Signed)
3 MONTH VISIT FOR [id number] STUDY; Called patient for 3 month follow up on above study. Informed patient she is at the 3 month time point since enrolling on study and she should be receiving an email from study to complete 3 month questionnaire online tomorrow.  Asked patient to please complete it as soon as she can.  Patient verbalized understanding and agreed to complete the questionnaire.  Patient has finished radiation treatment with Xeloda and is scheduled for surgery next week. She states she is thinks she is supposed to have more chemotherapy after her surgery but she is not sure what kind. Intent of treatment for her stage 3 rectal cancer continues to be curative.  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 ECOG level = 0 assessed by this RN. Patient denies any changes to her insurance since beginning on study.  Encouraged to contact research nurse if any questions or difficulties with the questionnaire. She verbalized understanding. Thanked patient very much for her ongoing participation in this study.  Foye Spurling, BSN, RN Clinical Research Nurse 09/09/2018 2:57 PM

## 2018-09-09 NOTE — Consult Note (Signed)
Spoke with patient related to marking for potential fecal ostomy related to her upcoming intestinal surgery.  She stated that her surgeon told her and her son, "no bag".  She does not want to be marked, therefore, it was not done. Val Riles, RN, MSN, CWOCN, CNS-BC, pager [phone]

## 2018-09-15 MED ORDER — BUPIVACAINE LIPOSOME 1.3 % IJ SUSP
20.0000 mL | INTRAMUSCULAR | Status: DC
  Filled 2018-09-15: qty 20

## 2018-09-16 ENCOUNTER — Inpatient Hospital Stay (HOSPITAL_COMMUNITY): Payer: 59 | Admitting: Certified Registered"

## 2018-09-16 ENCOUNTER — Inpatient Hospital Stay (HOSPITAL_COMMUNITY)
Admission: RE | Admit: 2018-09-16 | Discharge: 2018-09-20 | DRG: 331 | Disposition: A | Discharge status: Home / Self Care | Payer: 59 | Attending: General Surgery | Admitting: General Surgery

## 2018-09-16 ENCOUNTER — Encounter (HOSPITAL_COMMUNITY)
Admission: RE | Disposition: A | Discharge status: Home / Self Care | Payer: Self-pay | Source: Home / Self Care | Attending: General Surgery

## 2018-09-16 ENCOUNTER — Other Ambulatory Visit: Payer: Self-pay

## 2018-09-16 ENCOUNTER — Encounter (HOSPITAL_COMMUNITY): Payer: Self-pay

## 2018-09-16 DIAGNOSIS — F1990 Other psychoactive substance use, unspecified, uncomplicated: Secondary | ICD-10-CM | POA: Diagnosis present

## 2018-09-16 DIAGNOSIS — F17200 Nicotine dependence, unspecified, uncomplicated: Secondary | ICD-10-CM | POA: Diagnosis present

## 2018-09-16 DIAGNOSIS — J439 Emphysema, unspecified: Secondary | ICD-10-CM | POA: Diagnosis present

## 2018-09-16 DIAGNOSIS — Z923 Personal history of irradiation: Secondary | ICD-10-CM

## 2018-09-16 DIAGNOSIS — Z9221 Personal history of antineoplastic chemotherapy: Secondary | ICD-10-CM | POA: Diagnosis not present

## 2018-09-16 DIAGNOSIS — C20 Malignant neoplasm of rectum: Secondary | ICD-10-CM | POA: Diagnosis present

## 2018-09-16 DIAGNOSIS — C19 Malignant neoplasm of rectosigmoid junction: Secondary | ICD-10-CM | POA: Diagnosis present

## 2018-09-16 DIAGNOSIS — Z808 Family history of malignant neoplasm of other organs or systems: Secondary | ICD-10-CM

## 2018-09-16 HISTORY — PX: XI ROBOTIC ASSISTED LOWER ANTERIOR RESECTION: SHX6558

## 2018-09-16 LAB — TYPE AND SCREEN
ABO/RH(D): A POS
Antibody Screen: NEGATIVE

## 2018-09-16 SURGERY — RESECTION, RECTUM, LOW ANTERIOR, ROBOT-ASSISTED
Anesthesia: General | Site: Abdomen

## 2018-09-16 MED ORDER — HEPARIN SODIUM (PORCINE) 5000 UNIT/ML IJ SOLN
INTRAMUSCULAR | Status: DC | PRN
  Administered 2018-09-16: 5000 [IU] via SUBCUTANEOUS

## 2018-09-16 MED ORDER — ROCURONIUM BROMIDE 10 MG/ML (PF) SYRINGE
PREFILLED_SYRINGE | INTRAVENOUS | Status: DC | PRN
  Administered 2018-09-16: 50 mg via INTRAVENOUS

## 2018-09-16 MED ORDER — ALUM & MAG HYDROXIDE-SIMETH 200-200-20 MG/5ML PO SUSP: 30.0000 mL | Freq: Four times a day (QID) | ORAL | Status: DC | PRN

## 2018-09-16 MED ORDER — ONDANSETRON HCL 4 MG/2ML IJ SOLN
INTRAMUSCULAR | Status: DC | PRN
  Administered 2018-09-16: 4 mg via INTRAVENOUS

## 2018-09-16 MED ORDER — MIDAZOLAM HCL 2 MG/2ML IJ SOLN
INTRAMUSCULAR | Status: DC | PRN
  Administered 2018-09-16: 2 mg via INTRAVENOUS

## 2018-09-16 MED ORDER — MIDAZOLAM HCL 2 MG/2ML IJ SOLN
INTRAMUSCULAR | Status: AC
  Filled 2018-09-16: qty 2

## 2018-09-16 MED ORDER — NICOTINE 14 MG/24HR TD PT24
14.0000 mg | MEDICATED_PATCH | Freq: Every day | TRANSDERMAL | Status: DC
  Administered 2018-09-16 – 2018-09-20 (×5): 14 mg via TRANSDERMAL
  Filled 2018-09-16 (×5): qty 1

## 2018-09-16 MED ORDER — BUPROPION HCL ER (XL) 300 MG PO TB24
300.0000 mg | ORAL_TABLET | Freq: Every day | ORAL | Status: DC
  Administered 2018-09-17 – 2018-09-20 (×4): 300 mg via ORAL
  Filled 2018-09-16 (×4): qty 1

## 2018-09-16 MED ORDER — ALVIMOPAN 12 MG PO CAPS
12.0000 mg | ORAL_CAPSULE | Freq: Two times a day (BID) | ORAL | Status: DC
  Administered 2018-09-17: 12 mg via ORAL
  Filled 2018-09-16 (×2): qty 1

## 2018-09-16 MED ORDER — BUPIVACAINE-EPINEPHRINE 0.25% -1:200000 IJ SOLN
INTRAMUSCULAR | Status: DC | PRN
  Administered 2018-09-16: 30 mL

## 2018-09-16 MED ORDER — FENTANYL CITRATE (PF) 100 MCG/2ML IJ SOLN
25.0000 ug | INTRAMUSCULAR | Status: DC | PRN
  Administered 2018-09-16 (×2): 50 ug via INTRAVENOUS

## 2018-09-16 MED ORDER — HYDROMORPHONE HCL 1 MG/ML IJ SOLN
0.2500 mg | INTRAMUSCULAR | Status: DC | PRN
  Administered 2018-09-16 (×2): 0.25 mg via INTRAVENOUS
  Administered 2018-09-16 (×3): 0.5 mg via INTRAVENOUS

## 2018-09-16 MED ORDER — ROPINIROLE HCL 0.25 MG PO TABS
0.2500 mg | ORAL_TABLET | Freq: Every day | ORAL | Status: DC
  Administered 2018-09-16 – 2018-09-19 (×4): 0.25 mg via ORAL
  Filled 2018-09-16 (×4): qty 1

## 2018-09-16 MED ORDER — KCL IN DEXTROSE-NACL 20-5-0.45 MEQ/L-%-% IV SOLN
INTRAVENOUS | Status: DC
  Administered 2018-09-16 – 2018-09-18 (×3): via INTRAVENOUS
  Filled 2018-09-16 (×4): qty 1000

## 2018-09-16 MED ORDER — SUGAMMADEX SODIUM 200 MG/2ML IV SOLN
INTRAVENOUS | Status: AC
  Filled 2018-09-16: qty 2

## 2018-09-16 MED ORDER — LIDOCAINE 2% (20 MG/ML) 5 ML SYRINGE
INTRAMUSCULAR | Status: AC
  Filled 2018-09-16: qty 5

## 2018-09-16 MED ORDER — ALPRAZOLAM 1 MG PO TABS
2.0000 mg | ORAL_TABLET | Freq: Every day | ORAL | Status: DC
  Administered 2018-09-17 – 2018-09-20 (×4): 2 mg via ORAL
  Filled 2018-09-16 (×4): qty 2

## 2018-09-16 MED ORDER — ONDANSETRON HCL 4 MG/2ML IJ SOLN
INTRAMUSCULAR | Status: AC
  Filled 2018-09-16: qty 2

## 2018-09-16 MED ORDER — LIDOCAINE HCL 2 % IJ SOLN
INTRAMUSCULAR | Status: AC
  Filled 2018-09-16: qty 20

## 2018-09-16 MED ORDER — KETAMINE HCL 10 MG/ML IJ SOLN
INTRAMUSCULAR | Status: DC | PRN
  Administered 2018-09-16 (×2): 15 mg via INTRAVENOUS

## 2018-09-16 MED ORDER — DEXAMETHASONE SODIUM PHOSPHATE 10 MG/ML IJ SOLN
INTRAMUSCULAR | Status: DC | PRN
  Administered 2018-09-16: 8 mg via INTRAVENOUS

## 2018-09-16 MED ORDER — RINGERS IRRIGATION IR SOLN
Status: DC | PRN
  Administered 2018-09-16: 1000 mL

## 2018-09-16 MED ORDER — SODIUM CHLORIDE 0.9 % IV SOLN
2.0000 g | INTRAVENOUS | Status: AC
  Administered 2018-09-16: 2 g via INTRAVENOUS
  Filled 2018-09-16: qty 2

## 2018-09-16 MED ORDER — ENSURE SURGERY PO LIQD
237.0000 mL | Freq: Two times a day (BID) | ORAL | Status: DC
  Administered 2018-09-17 – 2018-09-20 (×7): 237 mL via ORAL
  Filled 2018-09-16 (×8): qty 237

## 2018-09-16 MED ORDER — KETAMINE HCL 10 MG/ML IJ SOLN
INTRAMUSCULAR | Status: AC
  Filled 2018-09-16: qty 1

## 2018-09-16 MED ORDER — GABAPENTIN 300 MG PO CAPS
300.0000 mg | ORAL_CAPSULE | ORAL | Status: AC
  Administered 2018-09-16: 300 mg via ORAL
  Filled 2018-09-16: qty 1

## 2018-09-16 MED ORDER — ACETAMINOPHEN 500 MG PO TABS
1000.0000 mg | ORAL_TABLET | Freq: Four times a day (QID) | ORAL | Status: DC
  Administered 2018-09-16 – 2018-09-20 (×14): 1000 mg via ORAL
  Filled 2018-09-16 (×14): qty 2

## 2018-09-16 MED ORDER — CYCLOBENZAPRINE HCL 10 MG PO TABS
20.0000 mg | ORAL_TABLET | Freq: Every day | ORAL | Status: DC
  Administered 2018-09-16 – 2018-09-19 (×4): 20 mg via ORAL
  Filled 2018-09-16 (×4): qty 2

## 2018-09-16 MED ORDER — HYDROMORPHONE HCL 1 MG/ML IJ SOLN
0.5000 mg | INTRAMUSCULAR | Status: DC | PRN
  Administered 2018-09-16 – 2018-09-18 (×8): 0.5 mg via INTRAVENOUS
  Filled 2018-09-16 (×9): qty 0.5

## 2018-09-16 MED ORDER — BUPIVACAINE-EPINEPHRINE (PF) 0.25% -1:200000 IJ SOLN
INTRAMUSCULAR | Status: AC
  Filled 2018-09-16: qty 30

## 2018-09-16 MED ORDER — METHYLENE BLUE 0.5 % INJ SOLN
INTRAVENOUS | Status: AC
  Filled 2018-09-16: qty 10

## 2018-09-16 MED ORDER — DEXAMETHASONE SODIUM PHOSPHATE 10 MG/ML IJ SOLN
INTRAMUSCULAR | Status: AC
  Filled 2018-09-16: qty 1

## 2018-09-16 MED ORDER — 0.9 % SODIUM CHLORIDE (POUR BTL) OPTIME
TOPICAL | Status: DC | PRN
  Administered 2018-09-16: 2000 mL

## 2018-09-16 MED ORDER — ACETAMINOPHEN 500 MG PO TABS
1000.0000 mg | ORAL_TABLET | ORAL | Status: AC
  Administered 2018-09-16: 1000 mg via ORAL
  Filled 2018-09-16: qty 2

## 2018-09-16 MED ORDER — TRAMADOL HCL 50 MG PO TABS
50.0000 mg | ORAL_TABLET | Freq: Four times a day (QID) | ORAL | Status: DC | PRN
  Administered 2018-09-17 – 2018-09-19 (×3): 50 mg via ORAL
  Filled 2018-09-16 (×3): qty 1

## 2018-09-16 MED ORDER — FENTANYL CITRATE (PF) 100 MCG/2ML IJ SOLN
INTRAMUSCULAR | Status: AC
  Administered 2018-09-16: 50 ug via INTRAVENOUS
  Filled 2018-09-16: qty 2

## 2018-09-16 MED ORDER — ONDANSETRON HCL 4 MG PO TABS: 4.0000 mg | ORAL_TABLET | Freq: Four times a day (QID) | ORAL | Status: DC | PRN

## 2018-09-16 MED ORDER — ENOXAPARIN SODIUM 40 MG/0.4ML ~~LOC~~ SOLN
40.0000 mg | SUBCUTANEOUS | Status: DC
  Administered 2018-09-17 – 2018-09-20 (×4): 40 mg via SUBCUTANEOUS
  Filled 2018-09-16 (×4): qty 0.4

## 2018-09-16 MED ORDER — PROPOFOL 10 MG/ML IV BOLUS
INTRAVENOUS | Status: DC | PRN
  Administered 2018-09-16: 200 mg via INTRAVENOUS

## 2018-09-16 MED ORDER — LIDOCAINE 2% (20 MG/ML) 5 ML SYRINGE
INTRAMUSCULAR | Status: DC | PRN
  Administered 2018-09-16: 1.5 mg/kg/h via INTRAVENOUS

## 2018-09-16 MED ORDER — FENTANYL CITRATE (PF) 250 MCG/5ML IJ SOLN
INTRAMUSCULAR | Status: DC | PRN
  Administered 2018-09-16 (×4): 50 ug via INTRAVENOUS

## 2018-09-16 MED ORDER — LIDOCAINE 2% (20 MG/ML) 5 ML SYRINGE
INTRAMUSCULAR | Status: DC | PRN
  Administered 2018-09-16: 60 mg via INTRAVENOUS

## 2018-09-16 MED ORDER — LACTATED RINGERS IV SOLN
INTRAVENOUS | Status: DC
  Administered 2018-09-16: 08:00:00 via INTRAVENOUS

## 2018-09-16 MED ORDER — SACCHAROMYCES BOULARDII 250 MG PO CAPS
250.0000 mg | ORAL_CAPSULE | Freq: Two times a day (BID) | ORAL | Status: DC
  Administered 2018-09-16 – 2018-09-20 (×8): 250 mg via ORAL
  Filled 2018-09-16 (×8): qty 1

## 2018-09-16 MED ORDER — GABAPENTIN 300 MG PO CAPS
300.0000 mg | ORAL_CAPSULE | Freq: Two times a day (BID) | ORAL | Status: DC
  Administered 2018-09-16 – 2018-09-20 (×8): 300 mg via ORAL
  Filled 2018-09-16 (×8): qty 1

## 2018-09-16 MED ORDER — BUPIVACAINE LIPOSOME 1.3 % IJ SUSP
INTRAMUSCULAR | Status: DC | PRN
  Administered 2018-09-16: 20 mL

## 2018-09-16 MED ORDER — FENTANYL CITRATE (PF) 250 MCG/5ML IJ SOLN
INTRAMUSCULAR | Status: AC
  Filled 2018-09-16: qty 5

## 2018-09-16 MED ORDER — ONDANSETRON HCL 4 MG/2ML IJ SOLN: 4.0000 mg | Freq: Four times a day (QID) | INTRAMUSCULAR | Status: DC | PRN

## 2018-09-16 MED ORDER — SUGAMMADEX SODIUM 200 MG/2ML IV SOLN
INTRAVENOUS | Status: DC | PRN
  Administered 2018-09-16: 160 mg via INTRAVENOUS

## 2018-09-16 MED ORDER — HYDROMORPHONE HCL 1 MG/ML IJ SOLN
INTRAMUSCULAR | Status: AC
  Filled 2018-09-16: qty 1

## 2018-09-16 MED ORDER — ROCURONIUM BROMIDE 10 MG/ML (PF) SYRINGE
PREFILLED_SYRINGE | INTRAVENOUS | Status: AC
  Filled 2018-09-16: qty 10

## 2018-09-16 MED ORDER — HEPARIN SODIUM (PORCINE) 5000 UNIT/ML IJ SOLN
INTRAMUSCULAR | Status: AC
  Filled 2018-09-16: qty 1

## 2018-09-16 MED ORDER — SODIUM CHLORIDE 0.9 % IV SOLN
2.0000 g | Freq: Two times a day (BID) | INTRAVENOUS | Status: AC
  Administered 2018-09-16: 2 g via INTRAVENOUS
  Filled 2018-09-16: qty 2

## 2018-09-16 MED ORDER — ALVIMOPAN 12 MG PO CAPS
12.0000 mg | ORAL_CAPSULE | ORAL | Status: AC
  Administered 2018-09-16: 12 mg via ORAL
  Filled 2018-09-16: qty 1

## 2018-09-16 MED ORDER — HYDROMORPHONE HCL 1 MG/ML IJ SOLN
INTRAMUSCULAR | Status: AC
  Administered 2018-09-16: 0.5 mg via INTRAVENOUS
  Filled 2018-09-16: qty 1

## 2018-09-16 SURGICAL SUPPLY — 95 items
BLADE EXTENDED COATED 6.5IN (ELECTRODE) ×3 IMPLANT
CANNULA REDUC XI 12-8 STAPL (CANNULA) ×1
CANNULA REDUC XI 12-8MM STAPL (CANNULA) ×1
CANNULA REDUCER 12-8 DVNC XI (CANNULA) ×1 IMPLANT
CELLS DAT CNTRL 66122 CELL SVR (MISCELLANEOUS) IMPLANT
CLIP VESOLOCK LG 6/CT PURPLE (CLIP) IMPLANT
CLIP VESOLOCK MED 6/CT (CLIP) IMPLANT
COVER SURGICAL LIGHT HANDLE (MISCELLANEOUS) ×6 IMPLANT
COVER TIP SHEARS 8 DVNC (MISCELLANEOUS) ×1 IMPLANT
COVER TIP SHEARS 8MM DA VINCI (MISCELLANEOUS) ×2
COVER WAND RF STERILE (DRAPES) ×3 IMPLANT
DECANTER SPIKE VIAL GLASS SM (MISCELLANEOUS) IMPLANT
DERMABOND ADVANCED (GAUZE/BANDAGES/DRESSINGS) ×2
DERMABOND ADVANCED .7 DNX12 (GAUZE/BANDAGES/DRESSINGS) ×1 IMPLANT
DRAIN CHANNEL 19F RND (DRAIN) IMPLANT
DRAPE ARM DVNC X/XI (DISPOSABLE) ×4 IMPLANT
DRAPE COLUMN DVNC XI (DISPOSABLE) ×1 IMPLANT
DRAPE DA VINCI XI ARM (DISPOSABLE) ×8
DRAPE DA VINCI XI COLUMN (DISPOSABLE) ×2
DRAPE SURG IRRIG POUCH 19X23 (DRAPES) ×3 IMPLANT
DRSG OPSITE POSTOP 4X10 (GAUZE/BANDAGES/DRESSINGS) IMPLANT
DRSG OPSITE POSTOP 4X6 (GAUZE/BANDAGES/DRESSINGS) ×3 IMPLANT
DRSG OPSITE POSTOP 4X8 (GAUZE/BANDAGES/DRESSINGS) IMPLANT
ELECT PENCIL ROCKER SW 15FT (MISCELLANEOUS) ×3 IMPLANT
ELECT REM PT RETURN 15FT ADLT (MISCELLANEOUS) ×3 IMPLANT
ENDOLOOP SUT PDS II  0 18 (SUTURE)
ENDOLOOP SUT PDS II 0 18 (SUTURE) IMPLANT
EVACUATOR SILICONE 100CC (DRAIN) IMPLANT
GAUZE SPONGE 4X4 12PLY STRL (GAUZE/BANDAGES/DRESSINGS) IMPLANT
GLOVE BIO SURGEON STRL SZ 6.5 (GLOVE) ×6 IMPLANT
GLOVE BIO SURGEONS STRL SZ 6.5 (GLOVE) ×3
GLOVE BIOGEL PI IND STRL 7.0 (GLOVE) ×3 IMPLANT
GLOVE BIOGEL PI INDICATOR 7.0 (GLOVE) ×6
GOWN STRL REUS W/TWL 2XL LVL3 (GOWN DISPOSABLE) ×9 IMPLANT
GOWN STRL REUS W/TWL XL LVL3 (GOWN DISPOSABLE) ×12 IMPLANT
GRASPER ENDOPATH ANVIL 10MM (MISCELLANEOUS) IMPLANT
GRASPER SUT TROCAR 14GX15 (MISCELLANEOUS) IMPLANT
HOLDER FOLEY CATH W/STRAP (MISCELLANEOUS) ×3 IMPLANT
IRRIG SUCT STRYKERFLOW 2 WTIP (MISCELLANEOUS) ×3
IRRIGATION SUCT STRKRFLW 2 WTP (MISCELLANEOUS) ×1 IMPLANT
IRRIGATOR SUCT 8 DISP DVNC XI (IRRIGATION / IRRIGATOR) IMPLANT
IRRIGATOR SUCTION 8MM XI DISP (IRRIGATION / IRRIGATOR)
KIT PROCEDURE DA VINCI SI (MISCELLANEOUS) ×2
KIT PROCEDURE DVNC SI (MISCELLANEOUS) ×1 IMPLANT
NEEDLE INSUFFLATION 14GA 120MM (NEEDLE) ×3 IMPLANT
PACK CARDIOVASCULAR III (CUSTOM PROCEDURE TRAY) ×3 IMPLANT
PACK COLON (CUSTOM PROCEDURE TRAY) ×3 IMPLANT
PORT LAP GEL ALEXIS MED 5-9CM (MISCELLANEOUS) ×3 IMPLANT
RTRCTR WOUND ALEXIS 18CM MED (MISCELLANEOUS)
SCISSORS LAP 5X35 DISP (ENDOMECHANICALS) ×3 IMPLANT
SEAL CANN UNIV 5-8 DVNC XI (MISCELLANEOUS) ×3 IMPLANT
SEAL XI 5MM-8MM UNIVERSAL (MISCELLANEOUS) ×6
SEALER VESSEL DA VINCI XI (MISCELLANEOUS) ×2
SEALER VESSEL EXT DVNC XI (MISCELLANEOUS) ×1 IMPLANT
SLEEVE ADV FIXATION 5X100MM (TROCAR) IMPLANT
SOLUTION ELECTROLUBE (MISCELLANEOUS) ×3 IMPLANT
STAPLER 45 BLU RELOAD XI (STAPLE) ×2 IMPLANT
STAPLER 45 BLUE RELOAD XI (STAPLE) ×4
STAPLER 45 GREEN RELOAD XI (STAPLE)
STAPLER 45 GRN RELOAD XI (STAPLE) IMPLANT
STAPLER CANNULA SEAL DVNC XI (STAPLE) ×1 IMPLANT
STAPLER CANNULA SEAL XI (STAPLE) ×2
STAPLER ECHELON POWER CIR 31 (STAPLE) ×3 IMPLANT
STAPLER SHEATH (SHEATH) ×2
STAPLER SHEATH ENDOWRIST DVNC (SHEATH) ×1 IMPLANT
STAPLER VISISTAT 35W (STAPLE) IMPLANT
SUT ETHILON 2 0 PS N (SUTURE) IMPLANT
SUT NOVA NAB DX-16 0-1 5-0 T12 (SUTURE) ×6 IMPLANT
SUT PROLENE 2 0 KS (SUTURE) ×3 IMPLANT
SUT SILK 2 0 (SUTURE) ×2
SUT SILK 2 0 SH CR/8 (SUTURE) ×3 IMPLANT
SUT SILK 2-0 18XBRD TIE 12 (SUTURE) ×1 IMPLANT
SUT SILK 3 0 (SUTURE) ×2
SUT SILK 3 0 SH CR/8 (SUTURE) ×3 IMPLANT
SUT SILK 3-0 18XBRD TIE 12 (SUTURE) ×1 IMPLANT
SUT V-LOC BARB 180 2/0GR6 GS22 (SUTURE)
SUT VIC AB 2-0 SH 18 (SUTURE) ×3 IMPLANT
SUT VIC AB 2-0 SH 27 (SUTURE) ×2
SUT VIC AB 2-0 SH 27X BRD (SUTURE) ×1 IMPLANT
SUT VIC AB 3-0 SH 18 (SUTURE) ×3 IMPLANT
SUT VIC AB 4-0 PS2 18 (SUTURE) ×6 IMPLANT
SUT VIC AB 4-0 PS2 27 (SUTURE) ×9 IMPLANT
SUT VICRYL 0 UR6 27IN ABS (SUTURE) ×3 IMPLANT
SUTURE V-LC BRB 180 2/0GR6GS22 (SUTURE) IMPLANT
SYR 10ML ECCENTRIC (SYRINGE) ×3 IMPLANT
SYS LAPSCP GELPORT 120MM (MISCELLANEOUS)
SYSTEM LAPSCP GELPORT 120MM (MISCELLANEOUS) IMPLANT
TOWEL OR 17X26 10 PK STRL BLUE (TOWEL DISPOSABLE) IMPLANT
TOWEL OR NON WOVEN STRL DISP B (DISPOSABLE) ×3 IMPLANT
TRAY FOLEY CATH 14FRSI W/METER (CATHETERS) ×3 IMPLANT
TRAY FOLEY MTR SLVR 16FR STAT (SET/KITS/TRAYS/PACK) IMPLANT
TROCAR ADV FIXATION 5X100MM (TROCAR) ×3 IMPLANT
TUBING CONNECTING 10 (TUBING) ×4 IMPLANT
TUBING CONNECTING 10' (TUBING) ×2
TUBING INSUFFLATION 10FT LAP (TUBING) ×3 IMPLANT

## 2018-09-16 NOTE — Progress Notes (Signed)
Care assumed for this patient at this time. Norma A. has went to lunch. Pt is alert, calm and pleasant and has no s/s of distress. VS and orders assessed and will continue to monitor and tx pt according to MD orders. Awaiting a room on the floor for this pt at this time.

## 2018-09-16 NOTE — Op Note (Signed)
09/16/2018  12:07 PM  PATIENT:  Norma Colon  57 y.o. female  Patient Care Team: Dian Queen, MD as PCP - General (Obstetrics and Gynecology)  PRE-OPERATIVE DIAGNOSIS:  rectal cancer  POST-OPERATIVE DIAGNOSIS:  rectal cancer  PROCEDURE:  XI ROBOTIC ASSISTED LOWER ANTERIOR RESECTION    Surgeon(s): Leighton Ruff, MD Clovis Riley, MD  ASSISTANT: Dr Kae Heller   ANESTHESIA:   local and general  EBL: 55ml Total I/O In: -  Out: 150 [Urine:120; Blood:30]  Delay start of Pharmacological VTE agent (>24hrs) due to surgical blood loss or risk of bleeding:  no  DRAINS: none   SPECIMEN:  Source of Specimen:  rectosigmoid  DISPOSITION OF SPECIMEN:  PATHOLOGY  COUNTS:  YES  PLAN OF CARE: Admit to inpatient   PATIENT DISPOSITION:  PACU - hemodynamically stable.  INDICATION:    57 y.o. F with proximal rectal cancer.  She is s/p neoadjuvant ChemoRT.  I recommended segmental resection:  The anatomy & physiology of the digestive tract was discussed.  The pathophysiology was discussed.  Natural history risks without surgery was discussed.   I worked to give an overview of the disease and the frequent need to have multispecialty involvement.  I feel the risks of no intervention will lead to serious problems that outweigh the operative risks; therefore, I recommended a partial colectomy to remove the pathology.  Laparoscopic & open techniques were discussed.   Risks such as bleeding, infection, abscess, leak, reoperation, possible ostomy, hernia, heart attack, death, and other risks were discussed.  I noted a good likelihood this will help address the problem.   Goals of post-operative recovery were discussed as well.    The patient expressed understanding & wished to proceed with surgery.  OR FINDINGS:   Patient had good response to radiation.  No obvious metastatic disease on visceral parietal peritoneum or liver.  The anastomosis rests ~9cm cm from the anal verge by rigid  proctoscopy.  DESCRIPTION:   Informed consent was confirmed.  The patient underwent general anaesthesia without difficulty.  The patient was positioned appropriately.  VTE prevention in place.  The patient's abdomen was clipped, prepped, & draped in a sterile fashion.  Surgical timeout confirmed our plan.  The patient was positioned in reverse Trendelenburg.  Abdominal entry was gained using a Varies needle in the LUQ.  Entry was clean.  I induced carbon dioxide insufflation.  An 2mm robotic port was placed in the RUQ.  Camera inspection revealed no injury.  Extra ports were carefully placed under direct laparoscopic visualization.  I laparoscopically reflected the greater omentum and the upper abdomen the small bowel in the upper abdomen. The patient was appropriately positioned and the robot was docked to the patient's left side.  Instruments were placed under direct visualization.    I mobilized the sigmoid colon off of the pelvic sidewall.  I scored the base of peritoneum of the right side of the mesentery of the left colon from the ligament of Treitz to the peritoneal reflection of the mid rectum.  The patient had proximal and distal tattoo in the lower sigmoid and upper rectum respectively.  The tumor appeared to have regressed quite a bit.  I elevated the sigmoid mesentery and enetered into the retro-mesenteric plane. We were able to identify the left ureter and gonadal vessels. We kept those posterior within the retroperitoneum and elevated the left colon mesentery off that. I did isolated IMA pedicle but did not ligate it yet.  I continued distally and got  into the avascular plane posterior to the mesorectum. This allowed me to help mobilize the rectum as well by freeing the mesorectum off the sacrum.  I mobilized the peritoneal coverings towards the peritoneal reflection on both the right and left sides of the rectum.  I could see the right and left ureters and stayed away from them.    I  skeletonized the inferior mesenteric artery pedicle.  I went down to its takeoff from the aorta.   I isolated the inferior mesenteric vein off of the ligament of Treitz just cephalad to that as well.  After confirming the left ureter was out of the way, I went ahead and ligated the inferior mesenteric artery pedicle with bipolar robotic vessel sealer ~2cm above its takeoff from the aorta.   I did not ligate the inferior mesenteric vein.  We ensured hemostasis. I skeletonized the mesorectum at the level of the distal tattoo using blunt dissection & bipolar robotic vessel sealer.  This was ~ 2 cm below the bottom edge of the tumor.  I mobilized the left colon in a lateral to medial fashion off the line of Toldt up towards the splenic flexure to ensure good mobilization of the left colon to reach into the pelvis.  I divided the rectum using 2 blue load robotic stapler fires.  I ensured that the remaining colon would reach into the pelvis.  I then skeletonized the mesentery to this level using the robotic vessel sealer.  The robot was then undocked and the abdomen was desufflated.  A small Pfannenstiel incision was made.  An Alexis wound protector was placed and the colon was brought out through this.  The specimen was inspected.  The mesorectum was intact and our distal margin was approximately 2 cm below the edge of the tumor.  There appeared to be good regression of the tumor with her radiation therapy.  I placed a pursestring device over the proximal margin which had already been divided robotically.  2-0 Prolene pursestring was applied.  The colon was then transected using electrocautery.  This was secured with 3-0 silk sutures.  A 31 mm EEA anvil was placed into the remaining colon and the pursestring was tied tightly around this.  This was then placed back into the abdomen.  The abdomen was reinsufflated and an anastomosis was created through the distal rectal stump under laparoscopic visualization.  There was  no tension on the anastomosis.  There was no leak when tested with insufflation under water.  Hemostasis was good.  I decided not to leave a drain as we had only dissected about 50% of her pelvis.  The anastomosis rests approximately 9 cm from the anal verge by rigid proctoscopy.  We then switched to clean gowns, gloves, instruments and drapes.  The peritoneum of the Pfannenstiel incision was closed using a running 2-0 Vicryl suture.  The fascia was closed using interrupted #1 Novafil sutures.  Subcutaneous tissue was reapproximated using interrupted 2-0 Vicryl sutures and the skin was closed using a running 4-0 subcuticular Vicryl suture.  A sterile dressing was applied.  Dermabond and 4-0 Vicryl sutures were used to close the port sites.  Patient tolerated this well was sent to the postanesthesia care unit in stable condition.  All counts were correct per operating room staff.    An MD assistant was necessary for tissue manipulation, retraction and positioning due to the complexity of the case and hospital policies

## 2018-09-16 NOTE — Progress Notes (Signed)
Patient received from PACU via stretcher. Complains of slight pain in abdominal area. Foley in place. Will continue to monitor.

## 2018-09-16 NOTE — Anesthesia Procedure Notes (Signed)
Procedure Name: Intubation Date/Time: 09/16/2018 9:54 AM Performed by: Niel Hummer, CRNA Pre-anesthesia Checklist: Suction available, Emergency Drugs available and Patient identified Patient Re-evaluated:Patient Re-evaluated prior to induction Oxygen Delivery Method: Circle system utilized Preoxygenation: Pre-oxygenation with 100% oxygen Induction Type: IV induction Ventilation: Mask ventilation without difficulty Laryngoscope Size: Mac and 4 Grade View: Grade I Tube type: Oral Tube size: 7.0 mm Number of attempts: 1 Airway Equipment and Method: Stylet Placement Confirmation: positive ETCO2,  ETT inserted through vocal cords under direct vision and breath sounds checked- equal and bilateral Secured at: 20 cm Tube secured with: Tape Dental Injury: Teeth and Oropharynx as per pre-operative assessment

## 2018-09-16 NOTE — Transfer of Care (Signed)
Immediate Anesthesia Transfer of Care Note  Patient: Norma Colon  Procedure(s) Performed: XI ROBOTIC ASSISTED LOWER ANTERIOR RESECTION ERAS PATHWAY (N/A Abdomen)  Patient Location: PACU  Anesthesia Type:General  Level of Consciousness: awake, alert  and oriented  Airway & Oxygen Therapy: Patient Spontanous Breathing and Patient connected to face mask oxygen  Post-op Assessment: Report given to RN and Post -op Vital signs reviewed and stable  Post vital signs: Reviewed and stable  Last Vitals:  Vitals Value Taken Time  BP 139/84 09/16/2018 12:25 PM  Temp    Pulse 78 09/16/2018 12:27 PM  Resp 16 09/16/2018 12:27 PM  SpO2 100 % 09/16/2018 12:27 PM  Vitals shown include unvalidated device data.  Last Pain:  Vitals:   09/16/18 0806  TempSrc:   PainSc: 0-No pain         Complications: No apparent anesthesia complications

## 2018-09-16 NOTE — H&P (Signed)
The patient is a 57 year old female who presents with colorectal cancer. 57yo F who I saw in March for rectal bleeding. I recommended that she get a colonoscopy due to her symptoms and age. She eventually saw her gastroenterologist and this was completed. A mass was noted at 10 cm and tattooed. biopsies showed adenocarcinoma. CEA is WNL's. CT scans of chest, abdomen and pelvis show possible localized lymphadenopathy but no signs of metastatic disease. Pt denied any weight loss or change in bowel habits. No PSH. She has now completed neoadjuvant radiation and chemotherapy with final treatment on October 16.    Past Surgical History (Tanisha A. Owens Shark, [place]; 08/09/2018 9:55 AM) Colon Polyp Removal - Colonoscopy Colon Polyp Removal - Open Shoulder Surgery Bilateral, Right.  Diagnostic Studies History Leighton Ruff, MD; 93/11/3555 10:20 AM) Colonoscopy within last year Mammogram within last year  Allergies (Tanisha A. Owens Shark, [place]; 08/09/2018 9:55 AM) Codeine Phosphate *ANALGESICS - OPIOID* Allergies Reconciled  Medication History (Tanisha A. Owens Shark, [place]; 08/09/2018 9:55 AM) Calcium-Vitamin D (600MG  Tablet Chewable, Oral) Active. Magnesium (100MG  Tablet, Oral) Active. Fiber (Oral) Active. Hydrocodone-Acetaminophen (10-325MG  Tablet, Oral) Active. ALPRAZolam (2MG  Tablet, Oral) Active. BuPROPion HCl ER (XL) (300MG  Tablet ER 24HR, Oral) Active. Cyclobenzaprine HCl (10MG  Tablet, Oral) Active. ROPINIRole HCl (0.25MG  Tablet, Oral) Active. Medications Reconciled  Social History (Tanisha A. Owens Shark, [place]; 08/09/2018 9:55 AM) Alcohol use Occasional alcohol use. Caffeine use Carbonated beverages, Coffee, Tea. Illicit drug use Prefer to discuss with provider. Tobacco use Current every day smoker.  Family History (Tanisha A. Owens Shark, [place]; 08/09/2018 9:55 AM) Arthritis Mother. Cancer Father. Colon Polyps Father. Depression Father, Mother, Sister. Hypertension  Father. Melanoma Father. Migraine Headache Mother, Sister. Respiratory Condition Father.  Pregnancy / Birth History (Tanisha A. Owens Shark, [place]; 08/09/2018 9:55 AM) Age at menarche 29 years. Age of menopause 51-55 Contraceptive History Contraceptive implant, Oral contraceptives. Gravida 1 Maternal age 69-30 Para 1  Other Problems (Tanisha A. Owens Shark, [place]; 08/09/2018 9:55 AM) Anxiety Disorder Arthritis Back Pain Cholelithiasis Colon Cancer Depression Emphysema Of Lung Hemorrhoids Migraine Headache     Review of Systems General Present- Weight Gain. Not Present- Appetite Loss, Chills, Fatigue, Fever, Night Sweats and Weight Loss. Skin Present- Dryness. Not Present- Change in Wart/Mole, Hives, Jaundice, New Lesions, Non-Healing Wounds, Rash and Ulcer. HEENT Not Present- Earache, Hearing Loss, Hoarseness, Nose Bleed, Oral Ulcers, Ringing in the Ears, Seasonal Allergies, Sinus Pain, Sore Throat, Visual Disturbances, Wears glasses/contact lenses and Yellow Eyes. Respiratory Present- Snoring. Not Present- Bloody sputum, Chronic Cough, Difficulty Breathing and Wheezing. Breast Not Present- Breast Mass, Breast Pain, Nipple Discharge and Skin Changes. Cardiovascular Present- Leg Cramps. Not Present- Chest Pain, Difficulty Breathing Lying Down, Palpitations, Rapid Heart Rate, Shortness of Breath and Swelling of Extremities. Gastrointestinal Present- Bloating, Bloody Stool and Hemorrhoids. Not Present- Abdominal Pain, Change in Bowel Habits, Chronic diarrhea, Constipation, Difficulty Swallowing, Excessive gas, Gets full quickly at meals, Indigestion, Nausea, Rectal Pain and Vomiting. Female Genitourinary Not Present- Frequency, Nocturia, Painful Urination, Pelvic Pain and Urgency. Musculoskeletal Present- Back Pain. Not Present- Joint Pain, Joint Stiffness, Muscle Pain, Muscle Weakness and Swelling of Extremities. Neurological Present- Headaches and Numbness. Not Present-  Decreased Memory, Fainting, Seizures, Tingling, Tremor, Trouble walking and Weakness. Psychiatric Present- Anxiety and Depression. Not Present- Bipolar, Change in Sleep Pattern, Fearful and Frequent crying. Endocrine Present- Hair Changes. Not Present- Cold Intolerance, Excessive Hunger, Heat Intolerance, Hot flashes and New Diabetes.  BP (!) 130/94   Pulse 87   Temp 97.8 F (36.6 C) (Oral)   Resp  16   Ht 5\' 9"  (1.753 m)   Wt 81.2 kg   LMP  (LMP Unknown)   SpO2 100%   BMI 26.43 kg/m     Physical Exam   General Mental Status-Alert. General Appearance-Not in acute distress. Build & Nutrition-Well nourished. Posture-Normal posture. Gait-Normal.  Head and Neck Head-normocephalic, atraumatic with no lesions or palpable masses. Trachea-midline.  Chest and Lung Exam Chest and lung exam reveals -quiet, even and easy respiratory effort with no use of accessory muscles and on auscultation, normal breath sounds, no adventitious sounds and normal vocal resonance.  Cardiovascular Cardiovascular examination reveals -normal heart sounds, regular rate and rhythm with no murmurs.  Abdomen Inspection Inspection of the abdomen reveals - No Hernias. Palpation/Percussion Palpation and Percussion of the abdomen reveal - Soft, Non Tender, No Rigidity (guarding), No hepatosplenomegaly and No Palpable abdominal masses.  Neurologic Neurologic evaluation reveals -alert and oriented x 3 with no impairment of recent or remote memory, normal attention span and ability to concentrate, normal sensation and normal coordination.  Musculoskeletal Normal Exam - Bilateral-Upper Extremity Strength Normal and Lower Extremity Strength Normal.    Assessment & Plan   RECTAL CANCER (C20) Impression: 57 year old female with a proximal rectal cancer here for preoperative evaluation. She has completed neoadjuvant chemotherapy and radiation and is ready to proceed with  surgery. We have discussed the robotic low anterior resection. She has been tattooed. I do not think she will need a diverting colostomy for this. We have counseled her on the risk of smoking and what that was due to her risk of anastomotic breakdown. She would like to try to quit smoking prior to surgery. The surgery and anatomy were described to the patient as well as the risks of surgery and the possible complications. These include: Bleeding, deep abdominal infections and possible wound complications such as hernia and infection, damage to adjacent structures, leak of surgical connections, which can lead to other surgeries and possibly an ostomy, possible need for other procedures, such as abscess drains in radiology, possible prolonged hospital stay, possible diarrhea from removal of part of the colon, possible constipation from narcotics, possible bowel, bladder or sexual dysfunction if having rectal surgery, prolonged fatigue/weakness or appetite loss, possible early recurrence of of disease, possible complications of their medical problems such as heart disease or arrhythmias or lung problems, death (less than 1%). I believe the patient understands and wishes to proceed with the surgery.

## 2018-09-16 NOTE — Anesthesia Postprocedure Evaluation (Signed)
Anesthesia Post Note  Patient: Norma Colon  Procedure(s) Performed: XI ROBOTIC ASSISTED LOWER ANTERIOR RESECTION ERAS PATHWAY (N/A Abdomen)     Patient location during evaluation: PACU Anesthesia Type: General Level of consciousness: awake and alert Pain management: pain level controlled Vital Signs Assessment: post-procedure vital signs reviewed and stable Respiratory status: spontaneous breathing, nonlabored ventilation, respiratory function stable and patient connected to nasal cannula oxygen Cardiovascular status: blood pressure returned to baseline and stable Postop Assessment: no apparent nausea or vomiting Anesthetic complications: no    Last Vitals:  Vitals:   09/16/18 1800 09/16/18 1858  BP: 116/81 136/84  Pulse: 85 77  Resp: 14 14  Temp: 36.4 C 36.4 C  SpO2: 98% 100%    Last Pain:  Vitals:   09/16/18 1858  TempSrc: Oral  PainSc:                  [name]

## 2018-09-16 NOTE — Anesthesia Preprocedure Evaluation (Signed)
Anesthesia Evaluation  Patient identified by MRN, date of birth, ID band Patient awake    Reviewed: Allergy & Precautions, NPO status , Patient's Chart, lab work & pertinent test results  History of Anesthesia Complications Negative for: history of anesthetic complications  Airway Mallampati: II  TM Distance: >3 FB Neck ROM: Full    Dental  (+) Teeth Intact   Pulmonary Current Smoker,    breath sounds clear to auscultation       Cardiovascular negative cardio ROS   Rhythm:Regular     Neuro/Psych  Headaches, PSYCHIATRIC DISORDERS Anxiety Depression    GI/Hepatic Neg liver ROS, rectal cancer   Endo/Other  negative endocrine ROS  Renal/GU negative Renal ROS  negative genitourinary   Musculoskeletal  (+) Arthritis ,   Abdominal   Peds negative pediatric ROS (+)  Hematology negative hematology ROS (+)   Anesthesia Other Findings   Reproductive/Obstetrics                             Anesthesia Physical Anesthesia Plan  ASA: II  Anesthesia Plan: General   Post-op Pain Management:    Induction: Intravenous  PONV Risk Score and Plan: 2 and Dexamethasone and Ondansetron  Airway Management Planned: Oral ETT  Additional Equipment: None  Intra-op Plan:   Post-operative Plan: Extubation in OR  Informed Consent: I have reviewed the patients History and Physical, chart, labs and discussed the procedure including the risks, benefits and alternatives for the proposed anesthesia with the patient or authorized representative who has indicated his/her understanding and acceptance.   Dental advisory given  Plan Discussed with: CRNA and Surgeon  Anesthesia Plan Comments:         Anesthesia Quick Evaluation

## 2018-09-17 ENCOUNTER — Encounter (HOSPITAL_COMMUNITY): Payer: Self-pay | Admitting: General Surgery

## 2018-09-17 LAB — BASIC METABOLIC PANEL
Anion gap: 10 (ref 5–15)
BUN: 11 mg/dL (ref 6–20)
CHLORIDE: 102 mmol/L (ref 98–111)
CO2: 26 mmol/L (ref 22–32)
Calcium: 8.8 mg/dL — ABNORMAL LOW (ref 8.9–10.3)
Creatinine, Ser: 1.37 mg/dL — ABNORMAL HIGH (ref 0.44–1.00)
GFR calc Af Amer: 50 mL/min — ABNORMAL LOW (ref >60)
GFR calc non Af Amer: 43 mL/min — ABNORMAL LOW (ref >60)
Glucose, Bld: 168 mg/dL — ABNORMAL HIGH (ref 70–99)
Potassium: 4.3 mmol/L (ref 3.5–5.1)
SODIUM: 138 mmol/L (ref 135–145)

## 2018-09-17 LAB — CBC
HCT: 32.7 % — ABNORMAL LOW (ref 36.0–46.0)
HEMOGLOBIN: 10.5 g/dL — AB (ref 12.0–15.0)
MCH: 32.1 pg (ref 26.0–34.0)
MCHC: 32.1 g/dL (ref 30.0–36.0)
MCV: 100 fL (ref 80.0–100.0)
Platelets: 232 10*3/uL (ref 150–400)
RBC: 3.27 MIL/uL — ABNORMAL LOW (ref 3.87–5.11)
RDW: 13.9 % (ref 11.5–15.5)
WBC: 9.9 10*3/uL (ref 4.0–10.5)
nRBC: 0 % (ref 0.0–0.2)

## 2018-09-17 NOTE — Progress Notes (Signed)
     Assessment & Plan: POD#1 - status post low anterior resection for rectal carcinoma  Starting full liquid diet per ERAS protocol  Ambulated  Pain controlled  Voiding, no flatus or BM yet        [name], MD       [facility]       Office: [phone]   Chief Complaint: Rectal carcinoma  Subjective: Patient in bed, no complaints.  Feels great.  Tolerating liquid diet, no nausea or emesis.  Ambulated.  Objective: Vital signs in last 24 hours: Temp:  [97.6 F (36.4 C)-98.1 F (36.7 C)] 98 F (36.7 C) (12/14 0543) Pulse Rate:  [75-95] 91 (12/14 0543) Resp:  [7-22] 17 (12/14 0543) BP: (107-154)/(63-93) 114/70 (12/14 0543) SpO2:  [98 %-100 %] 99 % (12/14 0543) Weight:  [79.6 kg] 79.6 kg (12/14 0543)    Intake/Output from previous day: 12/13 0701 - 12/14 0700 In: 1440 [P.O.:340; I.V.:1000; IV Piggyback:100] Out: 1550 [Urine:1520; Blood:30] Intake/Output this shift: Total I/O In: 600 [I.V.:600] Out: 300 [Urine:300]  Physical Exam: HEENT - sclerae clear, mucous membranes moist Neck - soft Chest - clear bilaterally Cor - RRR Abdomen - soft, mild distension; wounds dry and intact; no drains Ext - no edema, non-tender Neuro - alert & oriented, no focal deficits  Lab Results:  Recent Labs    09/17/18 0306  WBC 9.9  HGB 10.5*  HCT 32.7*  PLT 232   BMET Recent Labs    09/17/18 0306  NA 138  K 4.3  CL 102  CO2 26  GLUCOSE 168*  BUN 11  CREATININE 1.37*  CALCIUM 8.8*   PT/INR No results for input(s): LABPROT, INR in the last 72 hours. Comprehensive Metabolic Panel:    Component Value Date/Time   NA 138 09/17/2018 0306   NA 142 09/09/2018 1340   K 4.3 09/17/2018 0306   K 4.7 09/09/2018 1340   CL 102 09/17/2018 0306   CL 105 09/09/2018 1340   CO2 26 09/17/2018 0306   CO2 28 09/09/2018 1340   BUN 11 09/17/2018 0306   BUN 19 09/09/2018 1340   CREATININE 1.37 (H) 09/17/2018 0306   CREATININE 1.22 (H) 09/09/2018 1340   CREATININE 0.97 08/03/2018 1149   CREATININE 0.81 07/18/2018 1251   GLUCOSE 168 (H) 09/17/2018 0306   GLUCOSE 86 09/09/2018 1340   CALCIUM 8.8 (L) 09/17/2018 0306   CALCIUM 9.7 09/09/2018 1340   AST 21 08/03/2018 1149   AST 84 (H) 07/18/2018 1251   ALT 27 08/03/2018 1149   ALT 176 (H) 07/18/2018 1251   ALKPHOS 106 08/03/2018 1149   ALKPHOS 104 07/18/2018 1251   BILITOT 0.8 08/03/2018 1149   BILITOT 1.5 (H) 07/18/2018 1251   PROT 6.7 08/03/2018 1149   PROT 6.5 07/18/2018 1251   ALBUMIN 3.5 08/03/2018 1149   ALBUMIN 3.5 07/18/2018 1251    Studies/Results: No results found.    Orena Cavazos M 09/17/2018  Patient ID: Jacobo Forest, female   DOB: 05/05/1961, 57 y.o.   MRN: [id number]

## 2018-09-17 NOTE — Plan of Care (Signed)
Patient in bed this morning. States pain not well controlled with IV pain medication given at change of shift and requests additional medication. PO tramadol given. Will continue to monitor.

## 2018-09-18 LAB — CBC
HCT: 32.7 % — ABNORMAL LOW (ref 36.0–46.0)
Hemoglobin: 10.4 g/dL — ABNORMAL LOW (ref 12.0–15.0)
MCH: 32.4 pg (ref 26.0–34.0)
MCHC: 31.8 g/dL (ref 30.0–36.0)
MCV: 101.9 fL — ABNORMAL HIGH (ref 80.0–100.0)
NRBC: 0 % (ref 0.0–0.2)
PLATELETS: 241 10*3/uL (ref 150–400)
RBC: 3.21 MIL/uL — ABNORMAL LOW (ref 3.87–5.11)
RDW: 14.4 % (ref 11.5–15.5)
WBC: 5.9 10*3/uL (ref 4.0–10.5)

## 2018-09-18 LAB — BASIC METABOLIC PANEL
Anion gap: 8 (ref 5–15)
BUN: 10 mg/dL (ref 6–20)
CO2: 26 mmol/L (ref 22–32)
Calcium: 8.6 mg/dL — ABNORMAL LOW (ref 8.9–10.3)
Chloride: 108 mmol/L (ref 98–111)
Creatinine, Ser: 1.26 mg/dL — ABNORMAL HIGH (ref 0.44–1.00)
GFR calc Af Amer: 56 mL/min — ABNORMAL LOW (ref >60)
GFR calc non Af Amer: 48 mL/min — ABNORMAL LOW (ref >60)
Glucose, Bld: 89 mg/dL (ref 70–99)
Potassium: 4.3 mmol/L (ref 3.5–5.1)
Sodium: 142 mmol/L (ref 135–145)

## 2018-09-18 NOTE — Plan of Care (Signed)
Patient resting in bed this morning. States po pain medication did not help relieve pain. Requesting additional medication. Will continue to monitor.

## 2018-09-18 NOTE — Progress Notes (Signed)
     Assessment & Plan: POD#2 - status post low anterior resection for rectal carcinoma             tolerating full liquid diet - wants to advance to regular diet             Ambulating in halls             Pain controlled             Small BM with small blood; feels bloated        [name], MD       [facility]       Office: [phone]   Chief Complaint: Rectal carcinoma  Subjective: Patient less comfortable this AM, some bloating and pain.  Tolerating full liquid diet.  Ambulated.  Had BM.  Objective: Vital signs in last 24 hours: Temp:  [97.7 F (36.5 C)-98.1 F (36.7 C)] 98 F (36.7 C) (12/15 0537) Pulse Rate:  [72-87] 72 (12/15 0537) Resp:  [14-16] 15 (12/15 0537) BP: (131-139)/(80-90) 131/84 (12/15 0537) SpO2:  [99 %-100 %] 99 % (12/15 0537) Weight:  [80.2 kg] 80.2 kg (12/15 0537)    Intake/Output from previous day: 12/14 0701 - 12/15 0700 In: 2389.9 [P.O.:590; I.V.:1799.9] Out: 2900 [Urine:2900] Intake/Output this shift: No intake/output data recorded.  Physical Exam: HEENT - sclerae clear, mucous membranes moist Neck - soft Chest - clear bilaterally Cor - RRR Abdomen - soft, mild distension; active BS present; wounds dry and intact Ext - no edema, non-tender Neuro - alert & oriented, no focal deficits  Lab Results:  Recent Labs    09/17/18 0306 09/18/18 0347  WBC 9.9 5.9  HGB 10.5* 10.4*  HCT 32.7* 32.7*  PLT 232 241   BMET Recent Labs    09/17/18 0306 09/18/18 0347  NA 138 142  K 4.3 4.3  CL 102 108  CO2 26 26  GLUCOSE 168* 89  BUN 11 10  CREATININE 1.37* 1.26*  CALCIUM 8.8* 8.6*   PT/INR No results for input(s): LABPROT, INR in the last 72 hours. Comprehensive Metabolic Panel:    Component Value Date/Time   NA 142 09/18/2018 0347   NA 138 09/17/2018 0306   K 4.3 09/18/2018 0347   K 4.3 09/17/2018 0306   CL 108 09/18/2018 0347   CL 102 09/17/2018 0306   CO2 26 09/18/2018 0347   CO2 26 09/17/2018  0306   BUN 10 09/18/2018 0347   BUN 11 09/17/2018 0306   CREATININE 1.26 (H) 09/18/2018 0347   CREATININE 1.37 (H) 09/17/2018 0306   CREATININE 0.97 08/03/2018 1149   CREATININE 0.81 07/18/2018 1251   GLUCOSE 89 09/18/2018 0347   GLUCOSE 168 (H) 09/17/2018 0306   CALCIUM 8.6 (L) 09/18/2018 0347   CALCIUM 8.8 (L) 09/17/2018 0306   AST 21 08/03/2018 1149   AST 84 (H) 07/18/2018 1251   ALT 27 08/03/2018 1149   ALT 176 (H) 07/18/2018 1251   ALKPHOS 106 08/03/2018 1149   ALKPHOS 104 07/18/2018 1251   BILITOT 0.8 08/03/2018 1149   BILITOT 1.5 (H) 07/18/2018 1251   PROT 6.7 08/03/2018 1149   PROT 6.5 07/18/2018 1251   ALBUMIN 3.5 08/03/2018 1149   ALBUMIN 3.5 07/18/2018 1251    Studies/Results: No results found.    [name] M 09/18/2018  Patient ID: Norma Colon, female   DOB: 15-Oct-1960, 57 y.o.   MRN: [id number]

## 2018-09-19 LAB — BASIC METABOLIC PANEL
ANION GAP: 9 (ref 5–15)
BUN: 15 mg/dL (ref 6–20)
CO2: 26 mmol/L (ref 22–32)
Calcium: 8.7 mg/dL — ABNORMAL LOW (ref 8.9–10.3)
Chloride: 106 mmol/L (ref 98–111)
Creatinine, Ser: 1.2 mg/dL — ABNORMAL HIGH (ref 0.44–1.00)
GFR calc Af Amer: 59 mL/min — ABNORMAL LOW (ref >60)
GFR calc non Af Amer: 51 mL/min — ABNORMAL LOW (ref >60)
Glucose, Bld: 104 mg/dL — ABNORMAL HIGH (ref 70–99)
Potassium: 4 mmol/L (ref 3.5–5.1)
Sodium: 141 mmol/L (ref 135–145)

## 2018-09-19 LAB — CBC
HCT: 32.4 % — ABNORMAL LOW (ref 36.0–46.0)
Hemoglobin: 10.2 g/dL — ABNORMAL LOW (ref 12.0–15.0)
MCH: 32.3 pg (ref 26.0–34.0)
MCHC: 31.5 g/dL (ref 30.0–36.0)
MCV: 102.5 fL — ABNORMAL HIGH (ref 80.0–100.0)
NRBC: 0 % (ref 0.0–0.2)
Platelets: 269 10*3/uL (ref 150–400)
RBC: 3.16 MIL/uL — ABNORMAL LOW (ref 3.87–5.11)
RDW: 14.3 % (ref 11.5–15.5)
WBC: 5.7 10*3/uL (ref 4.0–10.5)

## 2018-09-19 MED ORDER — HYDROMORPHONE HCL 1 MG/ML IJ SOLN: 0.5000 mg | INTRAMUSCULAR | Status: DC | PRN

## 2018-09-19 MED ORDER — OXYCODONE HCL 5 MG PO TABS
5.0000 mg | ORAL_TABLET | Freq: Four times a day (QID) | ORAL | Status: DC | PRN
  Administered 2018-09-19 (×3): 5 mg via ORAL
  Filled 2018-09-19 (×3): qty 1

## 2018-09-19 NOTE — Progress Notes (Signed)
     Assessment & Plan: POD#3 - status post low anterior resection for rectal carcinoma             tolerating solid diet, having bowel function             Ambulating in halls             Pain not well controlled: switch to oxycodone  Anticipate d/c tom               Rosario Adie, MD Colorectal and General Surgery [facility]    Chief Complaint: Rectal carcinoma  Subjective: PO pain meds not working.  C/o incisional pain mostly.  Tolerating soft diet.  Ambulating in hall.  Having BM's.  Objective: Vital signs in last 24 hours: Temp:  [98.2 F (36.8 C)-98.6 F (37 C)] 98.6 F (37 C) (12/16 0543) Pulse Rate:  [82-84] 83 (12/16 0543) Resp:  [16-18] 16 (12/16 0543) BP: (113-134)/(73-82) 130/82 (12/16 0543) SpO2:  [96 %-98 %] 97 % (12/16 0543) Weight:  [85.7 kg] 85.7 kg (12/16 0543) Last BM Date: 09/18/18  Intake/Output from previous day: 12/15 0701 - 12/16 0700 In: 1197.5 [I.V.:1197.5] Out: 950 [Urine:950] Intake/Output this shift: No intake/output data recorded.  Physical Exam: HEENT - sclerae clear, mucous membranes moist Abdomen - soft, mild distension; active BS present; wounds dry and intact Ext - no edema, non-tender Neuro - alert & oriented, no focal deficits  Lab Results:  Recent Labs    09/18/18 0347 09/19/18 0344  WBC 5.9 5.7  HGB 10.4* 10.2*  HCT 32.7* 32.4*  PLT 241 269   BMET Recent Labs    09/18/18 0347 09/19/18 0344  NA 142 141  K 4.3 4.0  CL 108 106  CO2 26 26  GLUCOSE 89 104*  BUN 10 15  CREATININE 1.26* 1.20*  CALCIUM 8.6* 8.7*   PT/INR No results for input(s): LABPROT, INR in the last 72 hours. Comprehensive Metabolic Panel:    Component Value Date/Time   NA 141 09/19/2018 0344   NA 142 09/18/2018 0347   K 4.0 09/19/2018 0344   K 4.3 09/18/2018 0347   CL 106 09/19/2018 0344   CL 108 09/18/2018 0347   CO2 26 09/19/2018 0344   CO2 26 09/18/2018 0347   BUN 15 09/19/2018 0344   BUN 10 09/18/2018 0347     CREATININE 1.20 (H) 09/19/2018 0344   CREATININE 1.26 (H) 09/18/2018 0347   CREATININE 0.97 08/03/2018 1149   CREATININE 0.81 07/18/2018 1251   GLUCOSE 104 (H) 09/19/2018 0344   GLUCOSE 89 09/18/2018 0347   CALCIUM 8.7 (L) 09/19/2018 0344   CALCIUM 8.6 (L) 09/18/2018 0347   AST 21 08/03/2018 1149   AST 84 (H) 07/18/2018 1251   ALT 27 08/03/2018 1149   ALT 176 (H) 07/18/2018 1251   ALKPHOS 106 08/03/2018 1149   ALKPHOS 104 07/18/2018 1251   BILITOT 0.8 08/03/2018 1149   BILITOT 1.5 (H) 07/18/2018 1251   PROT 6.7 08/03/2018 1149   PROT 6.5 07/18/2018 1251   ALBUMIN 3.5 08/03/2018 1149   ALBUMIN 3.5 07/18/2018 1251    Studies/Results: No results found.    Rosario Adie [id number]/46/[id number]  Patient ID: Jacobo Forest, female   DOB: 05-Oct-1961, 57 y.o.   MRN: [id number]

## 2018-09-20 ENCOUNTER — Telehealth: Payer: Self-pay | Admitting: Oncology

## 2018-09-20 MED ORDER — OXYCODONE HCL 5 MG PO TABS: 5.0000 mg | ORAL_TABLET | Freq: Four times a day (QID) | ORAL | 0 refills | Status: DC | PRN

## 2018-09-20 MED ORDER — ACETAMINOPHEN 500 MG PO TABS: 1000.0000 mg | ORAL_TABLET | Freq: Four times a day (QID) | ORAL | 0 refills | Status: DC

## 2018-09-20 MED ORDER — NICOTINE 14 MG/24HR TD PT24: 14.0000 mg | MEDICATED_PATCH | Freq: Every day | TRANSDERMAL | 0 refills | Status: DC

## 2018-09-20 NOTE — Telephone Encounter (Signed)
Scheduled appt per 12/17 sch message - unable to  Reach patient .  left message for patient with appt date and time

## 2018-09-20 NOTE — Discharge Summary (Signed)
Physician Discharge Summary  Patient ID: Norma Colon MRN: [id number] DOB/AGE: 1961/03/03 57 y.o.  Admit date: 09/16/2018 Discharge date: 09/20/2018  Admission Diagnoses: rectal cancer  Discharge Diagnoses:  Active Problems:   Rectal cancer [facility])   Discharged Condition: good  Hospital Course: Pt admitted after surgery.  Her diet was advanced as tolerated.  She was kept in the hospital 1 extra day due to pain control.  By POD 4 she was ambulating well and pain was controlled with PO meds.  She was having bowel function.    Consults: None  Significant Diagnostic Studies: labs: cbc, bmet  Treatments: IV hydration, analgesia: oxycodone and surgery: robotic LAR  Discharge Exam: Blood pressure 133/82, pulse 77, temperature 98.3 F (36.8 C), temperature source Oral, resp. rate 19, height 5\' 9"  (1.753 m), weight 84.2 kg, SpO2 99 %. General appearance: alert and cooperative GI: normal findings: soft, non-tender Incision/Wound: clean, dry, intact  Disposition: home   Allergies as of 09/20/2018   No Active Allergies     Medication List    STOP taking these medications   HYDROcodone-acetaminophen 10-325 MG tablet Commonly known as:  NORCO     TAKE these medications   acetaminophen 500 MG tablet Commonly known as:  TYLENOL Take 2 tablets (1,000 mg total) by mouth every 6 (six) hours.   alprazolam 2 MG tablet Commonly known as:  XANAX Take 2 mg by mouth daily.   aspirin 81 MG tablet Take 81 mg by mouth daily.   CALCIUM 600+D3 PO Take 1 tablet by mouth daily.   CALCIUM CITRATE-VITAMIN D PO Take 1 tablet by mouth daily.   CALCIUM-MAGNESIUM-ZINC-D3 PO Take 1 tablet by mouth daily.   cyclobenzaprine 10 MG tablet Commonly known as:  FLEXERIL Take 20 mg by mouth at bedtime.   diphenoxylate-atropine 2.5-0.025 MG tablet Commonly known as:  LOMOTIL 1 to 2 PO QID prn diarrhea   FIBER PO Take 5 capsules by mouth daily.   hyoscyamine 0.125 MG/ML solution Commonly  known as:  LEVSIN Take 1 mL (0.125 mg total) by mouth every 4 (four) hours as needed.   MAGNESIUM OXIDE PO Take 900 mg by mouth daily.   MULTIVITAMIN/IRON PO Take 1 tablet by mouth daily.   nicotine 14 mg/24hr patch Commonly known as:  NICODERM CQ - dosed in mg/24 hours Place 1 patch (14 mg total) onto the skin daily.   ONE-A-DAY 50 PLUS PO Take 1 tablet by mouth daily.   oxyCODONE 5 MG immediate release tablet Commonly known as:  Oxy IR/ROXICODONE Take 1 tablet (5 mg total) by mouth every 6 (six) hours as needed for moderate pain, severe pain or breakthrough pain.   PROBIOTIC PO Take 1 capsule by mouth daily.   prochlorperazine 10 MG tablet Commonly known as:  COMPAZINE Take 1 tablet (10 mg total) by mouth every 6 (six) hours as needed for nausea or vomiting.   rOPINIRole 0.25 MG tablet Commonly known as:  REQUIP Take 0.25 mg by mouth at bedtime.   SOLUBLE FIBER/PROBIOTICS PO Take 1 capsule by mouth daily.   vitamin C 1000 MG tablet Take 1,000 mg by mouth daily.   Vitamin D3 125 MCG (5000 UT) Caps Take 5,000 Units by mouth daily.   WELLBUTRIN XL 300 MG 24 hr tablet Generic drug:  buPROPion Take 300 mg by mouth daily.      Follow-up Information    Leighton Ruff, MD. Schedule an appointment as soon as possible for a visit in 2 week(s).   Specialty:  General Surgery Contact information: [address] [place] [address] [phone]           Signed: Rosario Adie 68/34/1960, 8:33 AM

## 2018-09-20 NOTE — Progress Notes (Signed)
Discharge instructions given to pt and all questions were answered. Pt walked down to lobby and was picked up by her husband.  

## 2018-09-20 NOTE — Discharge Instructions (Signed)

## 2018-10-10 ENCOUNTER — Other Ambulatory Visit: Payer: Self-pay | Admitting: *Deleted

## 2018-10-10 ENCOUNTER — Inpatient Hospital Stay: Payer: 59 | Attending: Nurse Practitioner | Admitting: Oncology

## 2018-10-10 ENCOUNTER — Telehealth: Payer: Self-pay | Admitting: Pharmacist

## 2018-10-10 VITALS — BP 127/70 | HR 97 | Temp 98.1°F | Resp 18 | Ht 69.0 in | Wt 177.0 lb

## 2018-10-10 DIAGNOSIS — Z87891 Personal history of nicotine dependence: Secondary | ICD-10-CM | POA: Diagnosis not present

## 2018-10-10 DIAGNOSIS — C20 Malignant neoplasm of rectum: Secondary | ICD-10-CM | POA: Diagnosis present

## 2018-10-10 DIAGNOSIS — Z5111 Encounter for antineoplastic chemotherapy: Secondary | ICD-10-CM | POA: Diagnosis present

## 2018-10-10 DIAGNOSIS — R197 Diarrhea, unspecified: Secondary | ICD-10-CM

## 2018-10-10 MED ORDER — PROCHLORPERAZINE MALEATE 10 MG PO TABS: 10.0000 mg | ORAL_TABLET | Freq: Four times a day (QID) | ORAL | 1 refills | Status: DC | PRN

## 2018-10-10 MED ORDER — DIPHENOXYLATE-ATROPINE 2.5-0.025 MG PO TABS: ORAL_TABLET | ORAL | 1 refills | Status: DC

## 2018-10-10 MED ORDER — CAPECITABINE 500 MG PO TABS: ORAL_TABLET | ORAL | 3 refills | Status: DC

## 2018-10-10 NOTE — Progress Notes (Signed)
[facility] OFFICE PROGRESS NOTE   Diagnosis: Rectal cancer  INTERVAL HISTORY:   Norma Colon returns as scheduled.  She underwent a robotic assisted low anterior resection by Dr. Marcello Moores on 09/16/2018.  There was no evidence of metastatic disease.  The anastomosis is at 9 cm from the anal verge.  The tumor appears to have regressed.  The pathology ([id number]) revealed adenocarcinoma measuring 1 cm.  Tumor was located at the peritoneal reflection.  No macroscopic tumor perforation.  No lymphovascular or perineural invasion.  Tumor invaded into peri-colorectal tissue.  The resection margins are negative.  There was tumor regression with a partial response, score 2.  There were 2 tumor deposits.  The mismatch repair protein expression is intact.  The tumor returned MSI-stable.  She has abdominal soreness at the surgical sites.  She has stopped smoking.  Objective:  Vital signs in last 24 hours:  Blood pressure 127/70, pulse 97, temperature 98.1 F (36.7 C), temperature source Oral, resp. rate 18, height 5' 9" (1.753 m), weight 177 lb (80.3 kg), SpO2 99 %.   Resp: Lungs clear bilaterally Cardio: Regular rate and rhythm GI: No hepatomegaly,healed surgical incisions Vascular: No leg edema   Lab Results:  Lab Results  Component Value Date   WBC 5.7 09/19/2018   HGB 10.2 (L) 09/19/2018   HCT 32.4 (L) 09/19/2018   MCV 102.5 (H) 09/19/2018   PLT 269 09/19/2018   NEUTROABS 4.1 08/22/2018    CMP  Lab Results  Component Value Date   NA 141 09/19/2018   K 4.0 09/19/2018   CL 106 09/19/2018   CO2 26 09/19/2018   GLUCOSE 104 (H) 09/19/2018   BUN 15 09/19/2018   CREATININE 1.20 (H) 09/19/2018   CALCIUM 8.7 (L) 09/19/2018   PROT 6.7 08/03/2018   ALBUMIN 3.5 08/03/2018   AST 21 08/03/2018   ALT 27 08/03/2018   ALKPHOS 106 08/03/2018   BILITOT 0.8 08/03/2018   GFRNONAA 51 (L) 09/19/2018   GFRAA 59 (L) 09/19/2018     Medications: I have reviewed the patient's  current medications.   Assessment/Plan: 1. Rectal cancer ? 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 CTs 05/24/2018-mass beginning at proximal he 7.6 cm from the anal verge, "20 "perirectal lymph nodes, no evidence of metastatic disease ? Pelvic MRI 06/01/2018-tumor measured at 10.8 cm from the anal sphincter, T3b, N1-2 left perirectal lymph nodes, each measuring 5 mm ? Radiation and Xeloda initiated 06/13/2018, completed 07/20/2018 ? Low anterior resection 09/16/2018,ypT3,ypN1c, 1 cm tumor, partial response-score 2, 0/13 lymph nodes positive, 2 tumor deposits, MSI-stable, no loss of mismatch repair protein expression 2. Tobacco use 3. 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 Migraine headaches 5. Restless legs   Disposition: Norma Colon has been diagnosed with stage III rectal cancer.  The tumor was located in the high rectum.  I reviewed the details of the surgical pathology report with her.  We discussed the indication for "adjuvant "therapy.  She understands there is a significant chance of developing recurrent colorectal cancer over the next few years.  I discussed the benefit associated with 5-fluorouracil and oxaliplatin based therapies in this setting.  I recommend FOLFOX or CAPOX.  I reviewed the potential toxicities and treatment schedule with a each of these regimens.  She prefers CAPOX.  She has previously been treated with capecitabine.  We again reviewed the potential for diarrhea and hand/foot syndrome.  We discussed the allergic reaction and various types of neuropathy seen with oxaliplatin.  She would like to proceed with peripheral IV access.  Norma Colon will have baseline lab studies today.  She will be scheduled for cycle 1 CAPOX on 10/17/2018.  25 minutes were spent with the patient today.  The majority of the time was used for counseling and coordination of  care.  Betsy Coder, MD  10/10/2018  2:07 PM

## 2018-10-10 NOTE — Telephone Encounter (Signed)
Oral Oncology Pharmacist Encounter  Received new prescription for Xeloda (capecitabine) for the adjuvant treatment of stage III rectal cancer in conjunction with infusional oxaliplatin, planned duration 4 planned cycles (3 months of adjuvant treatment).  Patient is status post concurrent chemoradiation with Xeloda 06/13/18 - 07/20/18 Robotic assisted low anterior resection was performed 09/16/18 showing partial response to concurrent chemoradiation.  Patient is under evaluation for adjuvant treatment with Xeloda and oxaliplatin  Xeloda is planned to be administered at ~880 mg/m2 by mouth twice daily for 14 days on, 7 days off, repeated every 21 days oxaliplatin is planned to be administered at 130 mg/m2 IV every 21 days  Planned start date 10/17/2018  Labs from [place] for treatment.  Current medication list in Epic reviewed, no DDIs with Xeloda identified.  Prescription has been e-scribed to the [facility] [employer] [facility] on [address] at [facility] per patient request for benefits analysis and approval. This is where patient was able to obtain the best price for her Xeloda during concurrent chemoradiation.  Oral Oncology Clinic will continue to follow for insurance authorization, copayment issues, initial counseling and start date.  Johny Drilling, PharmD, BCPS, BCOP  10/10/2018 3:35 PM Oral Oncology Clinic [phone]

## 2018-10-10 NOTE — Progress Notes (Signed)
START ON PATHWAY REGIMEN - Colorectal     A cycle is every 21 days:     Capecitabine      Oxaliplatin   **Always confirm dose/schedule in your pharmacy ordering system**  Patient Characteristics: Post-Neoadjuvant Therapy and Resection (Neoadjuvant Pathologic Staging), Rectal, Prior Neoadjuvant ChemoRT Only, Node Positive (Preoperative or Pathological) Therapeutic Status: Post-Neoadjuvant Therapy and Resection (Neoadjuvant Pathologic Staging) Tumor Location: Rectal AJCC M Category: Staged < 8th Ed. AJCC T Category: Staged < 8th Ed. AJCC N Category: Staged < 8th Ed. AJCC 8 Stage Grouping: Staged < 8th Ed. Intent of Therapy: Curative Intent, Discussed with Patient

## 2018-10-10 NOTE — Telephone Encounter (Signed)
Also at request of patient asked pharmacy to pull bottle of Imodium AD and put in bag with her scripts.

## 2018-10-10 NOTE — Telephone Encounter (Signed)
Oral Chemotherapy Pharmacist Encounter   I spoke with patient for overview of: Xeloda (capecitabine) for the adjuvant treatment of stage III rectal cancer in conjunction with infusional oxaliplatin, planned duration 4 planned cycles (3 months of adjuvant treatment).   Counseled patient on administration, dosing, side effects, monitoring, drug-food interactions, safe handling, storage, and disposal.  Patient will take Xeloda 500mg  tablets, 4 tablets (2000mg ) by mouth in AM and 3 tabs (1500mg ) by mouth in PM, within 30 minutes of finishing meals, on days 1-14 of each 21 day cycle.   Oxaliplatin will be infused on day 1 of each 21 day cycle.  Xeloda and oxaliplatin start date: 10/17/2018  Adverse effects include but are not limited to: fatigue, decreased blood counts, GI upset, diarrhea, mouth sores, and hand-foot syndrome.  Patient has anti-emetic on hand and knows to take it if nausea develops.   Patient has anti diarrheal on hand and will alert the office of 4 or more loose stools above baseline.  Patient states she has non-stop diarrhea for the final 4 days of concurrent chemoradiation that was managed well with Lomotil. She received a new Rx for Lomotil today.  We discussed possible oxaliplatin side effects of cold sensitivity and peripheral neuropathy.  Reviewed with patient importance of keeping a medication schedule and plan for any missed doses.  Ms. Broadwell voiced understanding and appreciation.   All questions answered. Medication reconciliation performed and medication/allergy list updated.  Patient informed Xeloda prescription has been e-scribed to [facility] at [facility] as she requested. Oral oncology patient advocate will be able to assist patient in obtaining GoodRx copayment coupon if her copayment is as high at this time as it was during radiation phase.  Patient knows to call the office with questions or concerns. Oral Oncology Clinic will continue to  follow.  Johny Drilling, PharmD, BCPS, BCOP  10/10/2018   3:55 PM Oral Oncology Clinic [phone]

## 2018-10-11 ENCOUNTER — Telehealth: Payer: Self-pay | Admitting: Oncology

## 2018-10-11 ENCOUNTER — Encounter: Payer: Self-pay | Admitting: Pharmacist

## 2018-10-11 NOTE — Telephone Encounter (Signed)
Scheduled appt per 1/6 los - pt is aware of appt date and time

## 2018-10-14 ENCOUNTER — Other Ambulatory Visit: Payer: Self-pay | Admitting: Oncology

## 2018-10-17 ENCOUNTER — Inpatient Hospital Stay (HOSPITAL_BASED_OUTPATIENT_CLINIC_OR_DEPARTMENT_OTHER): Payer: 59 | Admitting: Medical

## 2018-10-17 ENCOUNTER — Other Ambulatory Visit: Payer: 59

## 2018-10-17 ENCOUNTER — Other Ambulatory Visit: Payer: Self-pay | Admitting: Medical

## 2018-10-17 ENCOUNTER — Inpatient Hospital Stay: Payer: 59

## 2018-10-17 VITALS — BP 115/62 | HR 82 | Temp 97.7°F | Resp 17

## 2018-10-17 DIAGNOSIS — L0291 Cutaneous abscess, unspecified: Secondary | ICD-10-CM

## 2018-10-17 DIAGNOSIS — Z5111 Encounter for antineoplastic chemotherapy: Secondary | ICD-10-CM | POA: Diagnosis not present

## 2018-10-17 DIAGNOSIS — C20 Malignant neoplasm of rectum: Secondary | ICD-10-CM

## 2018-10-17 LAB — CMP (CANCER CENTER ONLY)
ALK PHOS: 91 U/L (ref 38–126)
ALT: 13 U/L (ref 0–44)
AST: 14 U/L — ABNORMAL LOW (ref 15–41)
Albumin: 3.5 g/dL (ref 3.5–5.0)
Anion gap: 7 (ref 5–15)
BUN: 21 mg/dL — ABNORMAL HIGH (ref 6–20)
CO2: 27 mmol/L (ref 22–32)
Calcium: 9.3 mg/dL (ref 8.9–10.3)
Chloride: 108 mmol/L (ref 98–111)
Creatinine: 1.17 mg/dL — ABNORMAL HIGH (ref 0.44–1.00)
GFR, Est AFR Am: >60 mL/min (ref >60)
GFR, Estimated: 52 mL/min — ABNORMAL LOW (ref >60)
Glucose, Bld: 92 mg/dL (ref 70–99)
Potassium: 3.7 mmol/L (ref 3.5–5.1)
Sodium: 142 mmol/L (ref 135–145)
Total Bilirubin: 0.4 mg/dL (ref 0.3–1.2)
Total Protein: 6.9 g/dL (ref 6.5–8.1)

## 2018-10-17 LAB — CBC WITH DIFFERENTIAL (CANCER CENTER ONLY)
Abs Immature Granulocytes: 0.03 10*3/uL (ref 0.00–0.07)
Basophils Absolute: 0 10*3/uL (ref 0.0–0.1)
Basophils Relative: 1 %
Eosinophils Absolute: 0.2 10*3/uL (ref 0.0–0.5)
Eosinophils Relative: 4 %
HCT: 34.1 % — ABNORMAL LOW (ref 36.0–46.0)
Hemoglobin: 11.2 g/dL — ABNORMAL LOW (ref 12.0–15.0)
Immature Granulocytes: 1 %
Lymphocytes Relative: 19 %
Lymphs Abs: 0.8 10*3/uL (ref 0.7–4.0)
MCH: 32.2 pg (ref 26.0–34.0)
MCHC: 32.8 g/dL (ref 30.0–36.0)
MCV: 98 fL (ref 80.0–100.0)
Monocytes Absolute: 0.6 10*3/uL (ref 0.1–1.0)
Monocytes Relative: 14 %
Neutro Abs: 2.5 10*3/uL (ref 1.7–7.7)
Neutrophils Relative %: 61 %
Platelet Count: 222 10*3/uL (ref 150–400)
RBC: 3.48 MIL/uL — ABNORMAL LOW (ref 3.87–5.11)
RDW: 12.3 % (ref 11.5–15.5)
WBC Count: 4 10*3/uL (ref 4.0–10.5)
nRBC: 0 % (ref 0.0–0.2)

## 2018-10-17 MED ORDER — DEXTROSE 5 % IV SOLN
Freq: Once | INTRAVENOUS | Status: AC
  Administered 2018-10-17: 12:00:00 via INTRAVENOUS
  Filled 2018-10-17: qty 250

## 2018-10-17 MED ORDER — DEXAMETHASONE SODIUM PHOSPHATE 10 MG/ML IJ SOLN
10.0000 mg | Freq: Once | INTRAMUSCULAR | Status: AC
  Administered 2018-10-17: 10 mg via INTRAVENOUS

## 2018-10-17 MED ORDER — OXALIPLATIN CHEMO INJECTION 100 MG/20ML
126.0000 mg/m2 | Freq: Once | INTRAVENOUS | Status: AC
  Administered 2018-10-17: 250 mg via INTRAVENOUS
  Filled 2018-10-17: qty 40

## 2018-10-17 MED ORDER — PALONOSETRON HCL INJECTION 0.25 MG/5ML
0.2500 mg | Freq: Once | INTRAVENOUS | Status: AC
  Administered 2018-10-17: 0.25 mg via INTRAVENOUS

## 2018-10-17 MED ORDER — PALONOSETRON HCL INJECTION 0.25 MG/5ML
INTRAVENOUS | Status: AC
  Filled 2018-10-17: qty 5

## 2018-10-17 MED ORDER — DEXAMETHASONE SODIUM PHOSPHATE 10 MG/ML IJ SOLN
INTRAMUSCULAR | Status: AC
  Filled 2018-10-17: qty 1

## 2018-10-17 MED ORDER — MUPIROCIN 2 % EX OINT: TOPICAL_OINTMENT | CUTANEOUS | 0 refills | Status: DC

## 2018-10-17 MED ORDER — SULFAMETHOXAZOLE-TRIMETHOPRIM 800-160 MG PO TABS: 1.0000 | ORAL_TABLET | Freq: Two times a day (BID) | ORAL | 0 refills | Status: DC

## 2018-10-17 NOTE — Patient Instructions (Signed)
[facility] Discharge Instructions for Patients Receiving Chemotherapy  Today you received the following chemotherapy agents Oxaliplatin  To help prevent nausea and vomiting after your treatment, we encourage you to take your nausea medication as prescribed.  If you develop nausea and vomiting that is not controlled by your nausea medication, call the clinic.   BELOW ARE SYMPTOMS THAT SHOULD BE REPORTED IMMEDIATELY:  *FEVER GREATER THAN 100.5 F  *CHILLS WITH OR WITHOUT FEVER  NAUSEA AND VOMITING THAT IS NOT CONTROLLED WITH YOUR NAUSEA MEDICATION  *UNUSUAL SHORTNESS OF BREATH  *UNUSUAL BRUISING OR BLEEDING  TENDERNESS IN MOUTH AND THROAT WITH OR WITHOUT PRESENCE OF ULCERS  *URINARY PROBLEMS  *BOWEL PROBLEMS  UNUSUAL RASH Items with * indicate a potential emergency and should be followed up as soon as possible.  Feel free to call the clinic should you have any questions or concerns. The clinic phone number is [phone].  Please show the [facility] at check-in to the Emergency Department and triage nurse.  Oxaliplatin Injection What is this medicine? OXALIPLATIN (ox AL i PLA tin) is a chemotherapy drug. It targets fast dividing cells, like cancer cells, and causes these cells to die. This medicine is used to treat cancers of the colon and rectum, and many other cancers. This medicine may be used for other purposes; ask your health care provider or pharmacist if you have questions. COMMON BRAND NAME(S): Eloxatin What should I tell my health care provider before I take this medicine? They need to know if you have any of these conditions: -kidney disease -an unusual or allergic reaction to oxaliplatin, other chemotherapy, other medicines, foods, dyes, or preservatives -pregnant or trying to get pregnant -breast-feeding How should I use this medicine? This drug is given as an infusion into a vein. It is administered in a hospital or clinic by a  specially trained health care professional. Talk to your pediatrician regarding the use of this medicine in children. Special care may be needed. Overdosage: If you think you have taken too much of this medicine contact a poison control center or emergency room at once. NOTE: This medicine is only for you. Do not share this medicine with others. What if I miss a dose? It is important not to miss a dose. Call your doctor or health care professional if you are unable to keep an appointment. What may interact with this medicine? -medicines to increase blood counts like filgrastim, pegfilgrastim, sargramostim -probenecid -some antibiotics like amikacin, gentamicin, neomycin, polymyxin B, streptomycin, tobramycin -zalcitabine Talk to your doctor or health care professional before taking any of these medicines: -acetaminophen -aspirin -ibuprofen -ketoprofen -naproxen This list may not describe all possible interactions. Give your health care provider a list of all the medicines, herbs, non-prescription drugs, or dietary supplements you use. Also tell them if you smoke, drink alcohol, or use illegal drugs. Some items may interact with your medicine. What should I watch for while using this medicine? Your condition will be monitored carefully while you are receiving this medicine. You will need important blood work done while you are taking this medicine. This medicine can make you more sensitive to cold. Do not drink cold drinks or use ice. Cover exposed skin before coming in contact with cold temperatures or cold objects. When out in cold weather wear warm clothing and cover your mouth and nose to warm the air that goes into your lungs. Tell your doctor if you get sensitive to the cold. This drug may make  you feel generally unwell. This is not uncommon, as chemotherapy can affect healthy cells as well as cancer cells. Report any side effects. Continue your course of treatment even though you feel ill  unless your doctor tells you to stop. In some cases, you may be given additional medicines to help with side effects. Follow all directions for their use. Call your doctor or health care professional for advice if you get a fever, chills or sore throat, or other symptoms of a cold or flu. Do not treat yourself. This drug decreases your body's ability to fight infections. Try to avoid being around people who are sick. This medicine may increase your risk to bruise or bleed. Call your doctor or health care professional if you notice any unusual bleeding. Be careful brushing and flossing your teeth or using a toothpick because you may get an infection or bleed more easily. If you have any dental work done, tell your dentist you are receiving this medicine. Avoid taking products that contain aspirin, acetaminophen, ibuprofen, naproxen, or ketoprofen unless instructed by your doctor. These medicines may hide a fever. Do not become pregnant while taking this medicine. Women should inform their doctor if they wish to become pregnant or think they might be pregnant. There is a potential for serious side effects to an unborn child. Talk to your health care professional or pharmacist for more information. Do not breast-feed an infant while taking this medicine. Call your doctor or health care professional if you get diarrhea. Do not treat yourself. What side effects may I notice from receiving this medicine? Side effects that you should report to your doctor or health care professional as soon as possible: -allergic reactions like skin rash, itching or hives, swelling of the face, lips, or tongue -low blood counts - This drug may decrease the number of white blood cells, red blood cells and platelets. You may be at increased risk for infections and bleeding. -signs of infection - fever or chills, cough, sore throat, pain or difficulty passing urine -signs of decreased platelets or bleeding - bruising, pinpoint  red spots on the skin, black, tarry stools, nosebleeds -signs of decreased red blood cells - unusually weak or tired, fainting spells, lightheadedness -breathing problems -chest pain, pressure -cough -diarrhea -jaw tightness -mouth sores -nausea and vomiting -pain, swelling, redness or irritation at the injection site -pain, tingling, numbness in the hands or feet -problems with balance, talking, walking -redness, blistering, peeling or loosening of the skin, including inside the mouth -trouble passing urine or change in the amount of urine Side effects that usually do not require medical attention (report to your doctor or health care professional if they continue or are bothersome): -changes in vision -constipation -hair loss -loss of appetite -metallic taste in the mouth or changes in taste -stomach pain This list may not describe all possible side effects. Call your doctor for medical advice about side effects. You may report side effects to FDA at 1-800-FDA-1088. Where should I keep my medicine? This drug is given in a hospital or clinic and will not be stored at home. NOTE: This sheet is a summary. It may not cover all possible information. If you have questions about this medicine, talk to your doctor, pharmacist, or health care provider.  2019 Elsevier/Gold Standard (2008-04-17 17:22:47)

## 2018-10-17 NOTE — Progress Notes (Signed)
Pt reports "infection" at abdominal incision site, on assessment site is red with yellow/green drainage on dressing. Pt reports this "has ben geeting worse over the past week". Pt reports site is painful, no fevers reported. Sandi Mealy PA called to assess in tx area. PO abx and topical abx ointment called into pharmacy by Lucianne Lei. Per Lucianne Lei ok to tx today.

## 2018-10-17 NOTE — Telephone Encounter (Signed)
Oral Oncology Patient Advocate Encounter  I spoke to the patient while she was in infusion today. [name] did pick up her Xeloda from [facility] and her copay was $294.40 using the Good rx copay card. She doesn't like that her insurance is making her fill for a 30 day supply and will call to discuss with the insurance. I advised her to let me know how I can help her.  Gregory Patient [place] Phone [phone] Fax [phone]

## 2018-10-18 ENCOUNTER — Telehealth: Payer: Self-pay | Admitting: *Deleted

## 2018-10-18 NOTE — Telephone Encounter (Signed)
-----   Message from Thomasene Lot, RN sent at 10/17/2018  3:01 PM EST ----- Regarding: Dr Benay Spice 1st tx f/u call 1st tx f/u call

## 2018-10-18 NOTE — Telephone Encounter (Signed)
Called patient for chemo follow up. States she called the Hot line last night, IV site very prominent, states it was a  tingling pain. On Call nurse told her to take tylenol.  This morning "it looks OK". Is eating without any problems. Denies N/V. Understands to call if she has any problems.   Wants to talk to Dr Benay Spice about restarting her vitamins.

## 2018-10-20 NOTE — Progress Notes (Signed)
I was asked to see this patient in the infusion room as she was receiving treatment.  She reports that she has had an area of purulent drainage from a suprapubic incision.  The area has had a suture that has been protruding through the skin.  The patient denies fevers, chills, or sweats.  She was given a prescription for Bactrim DS p.o. twice daily x7 days and Bactroban.  Sandi Mealy, MHS, PA-C Physician Assistant

## 2018-10-25 ENCOUNTER — Other Ambulatory Visit: Payer: Self-pay | Admitting: Medical

## 2018-10-25 DIAGNOSIS — L0291 Cutaneous abscess, unspecified: Secondary | ICD-10-CM

## 2018-10-27 ENCOUNTER — Telehealth: Payer: Self-pay | Admitting: *Deleted

## 2018-10-27 NOTE — Telephone Encounter (Signed)
Patient called asking for another week of antibiotic saying her area of infection near incision is much improved but still has some drainage and still slightly pink. No fever. Per Dr. Benay Spice: No further antibiotic is necessary unless she develops a fever, drainage increases or area becomes painful. Notes that he consulted with Dr. Marcello Moores and she did not feel antibiotic was necessary intially.

## 2018-10-28 ENCOUNTER — Telehealth: Payer: Self-pay | Admitting: *Deleted

## 2018-10-28 NOTE — Telephone Encounter (Signed)
Called to ask how long to take the Xeloda pills? Does she have weekends off? Also, PCP wants to do her annual physical--is this OK? Instructed her to take the Xeloda each cycle for 14 consecutive days, then 7 day break. Always starts the Xeloda on the day she gets the Oxaliplatin. OK for annual physicial.

## 2018-11-04 ENCOUNTER — Telehealth: Payer: Self-pay | Admitting: *Deleted

## 2018-11-04 DIAGNOSIS — C20 Malignant neoplasm of rectum: Secondary | ICD-10-CM

## 2018-11-04 NOTE — Telephone Encounter (Signed)
Went to dentist yesterday and they did tray impressions for a future flouride treatment for her. Did not clean her teeth-need OK from oncologist as to when they can clean her teeth. She reports she does have history of significant gingivitis. Dentist saw some sores developing in her mouth even though she does not feel them. Was instructed to rinse mouth several times day with coconut oil. Dentist also said she was iron deficient from the appearance of her gums. Asking if MD will check her iron next week? Asking if she should take a MVI--informed her not to take MVI because it can induce worse side effects with Xeloda. Encouraged her to eat iron rich foods. Also saw Dr. Manon Hilding PA regarding her wound below umbilicus and they said to continue to observe and did not feel it needed any treatment at this time. She is frustrated with how long it is taking to heal. Also was told her BP was low--diastolic was 70. Informed her that this is an excellent reading or diastolic BP.  Per Dr. Benay Spice: OK to check ferritin on 2/3 visit.

## 2018-11-05 ENCOUNTER — Other Ambulatory Visit: Payer: Self-pay | Admitting: Oncology

## 2018-11-07 ENCOUNTER — Ambulatory Visit: Payer: 59

## 2018-11-07 ENCOUNTER — Inpatient Hospital Stay (HOSPITAL_BASED_OUTPATIENT_CLINIC_OR_DEPARTMENT_OTHER): Payer: 59 | Admitting: Nurse Practitioner

## 2018-11-07 ENCOUNTER — Telehealth: Payer: Self-pay

## 2018-11-07 ENCOUNTER — Ambulatory Visit: Payer: 59 | Admitting: Nurse Practitioner

## 2018-11-07 ENCOUNTER — Other Ambulatory Visit: Payer: Self-pay | Admitting: Nurse Practitioner

## 2018-11-07 ENCOUNTER — Encounter: Payer: Self-pay | Admitting: Nurse Practitioner

## 2018-11-07 ENCOUNTER — Inpatient Hospital Stay: Payer: 59 | Attending: Nurse Practitioner

## 2018-11-07 ENCOUNTER — Inpatient Hospital Stay: Payer: 59

## 2018-11-07 ENCOUNTER — Other Ambulatory Visit: Payer: 59

## 2018-11-07 VITALS — BP 124/75 | HR 89 | Temp 98.2°F | Resp 18 | Ht 69.0 in | Wt 183.1 lb

## 2018-11-07 DIAGNOSIS — T451X5D Adverse effect of antineoplastic and immunosuppressive drugs, subsequent encounter: Secondary | ICD-10-CM | POA: Diagnosis not present

## 2018-11-07 DIAGNOSIS — C20 Malignant neoplasm of rectum: Secondary | ICD-10-CM

## 2018-11-07 DIAGNOSIS — Z5111 Encounter for antineoplastic chemotherapy: Secondary | ICD-10-CM | POA: Insufficient documentation

## 2018-11-07 DIAGNOSIS — D701 Agranulocytosis secondary to cancer chemotherapy: Secondary | ICD-10-CM | POA: Insufficient documentation

## 2018-11-07 DIAGNOSIS — G2581 Restless legs syndrome: Secondary | ICD-10-CM | POA: Diagnosis not present

## 2018-11-07 DIAGNOSIS — G43909 Migraine, unspecified, not intractable, without status migrainosus: Secondary | ICD-10-CM | POA: Insufficient documentation

## 2018-11-07 DIAGNOSIS — K121 Other forms of stomatitis: Secondary | ICD-10-CM

## 2018-11-07 DIAGNOSIS — T451X5A Adverse effect of antineoplastic and immunosuppressive drugs, initial encounter: Secondary | ICD-10-CM

## 2018-11-07 DIAGNOSIS — Z720 Tobacco use: Secondary | ICD-10-CM

## 2018-11-07 LAB — CBC WITH DIFFERENTIAL (CANCER CENTER ONLY)
Abs Immature Granulocytes: 0.01 10*3/uL (ref 0.00–0.07)
Basophils Absolute: 0 10*3/uL (ref 0.0–0.1)
Basophils Relative: 0 %
Eosinophils Absolute: 0.2 10*3/uL (ref 0.0–0.5)
Eosinophils Relative: 6 %
HEMATOCRIT: 32 % — AB (ref 36.0–46.0)
Hemoglobin: 10.8 g/dL — ABNORMAL LOW (ref 12.0–15.0)
Immature Granulocytes: 0 %
LYMPHS ABS: 0.5 10*3/uL — AB (ref 0.7–4.0)
Lymphocytes Relative: 17 %
MCH: 32 pg (ref 26.0–34.0)
MCHC: 33.8 g/dL (ref 30.0–36.0)
MCV: 94.7 fL (ref 80.0–100.0)
Monocytes Absolute: 0.6 10*3/uL (ref 0.1–1.0)
Monocytes Relative: 21 %
Neutro Abs: 1.6 10*3/uL — ABNORMAL LOW (ref 1.7–7.7)
Neutrophils Relative %: 56 %
Platelet Count: 147 10*3/uL — ABNORMAL LOW (ref 150–400)
RBC: 3.38 MIL/uL — ABNORMAL LOW (ref 3.87–5.11)
RDW: 13.1 % (ref 11.5–15.5)
WBC Count: 2.9 10*3/uL — ABNORMAL LOW (ref 4.0–10.5)
nRBC: 0 % (ref 0.0–0.2)

## 2018-11-07 LAB — FERRITIN: Ferritin: 176 ng/mL (ref 11–307)

## 2018-11-07 LAB — CMP (CANCER CENTER ONLY)
ALT: 17 U/L (ref 0–44)
AST: 20 U/L (ref 15–41)
Albumin: 3.4 g/dL — ABNORMAL LOW (ref 3.5–5.0)
Alkaline Phosphatase: 89 U/L (ref 38–126)
Anion gap: 8 (ref 5–15)
BILIRUBIN TOTAL: 0.3 mg/dL (ref 0.3–1.2)
BUN: 14 mg/dL (ref 6–20)
CO2: 29 mmol/L (ref 22–32)
Calcium: 9.2 mg/dL (ref 8.9–10.3)
Chloride: 105 mmol/L (ref 98–111)
Creatinine: 1 mg/dL (ref 0.44–1.00)
Glucose, Bld: 83 mg/dL (ref 70–99)
Potassium: 3.2 mmol/L — ABNORMAL LOW (ref 3.5–5.1)
Sodium: 142 mmol/L (ref 135–145)
TOTAL PROTEIN: 6.6 g/dL (ref 6.5–8.1)

## 2018-11-07 LAB — IRON AND TIBC
Iron: 61 ug/dL (ref 41–142)
Saturation Ratios: 20 % — ABNORMAL LOW (ref 21–57)
TIBC: 309 ug/dL (ref 236–444)
UIBC: 247 ug/dL (ref 120–384)

## 2018-11-07 MED ORDER — DEXAMETHASONE SODIUM PHOSPHATE 10 MG/ML IJ SOLN
INTRAMUSCULAR | Status: AC
  Filled 2018-11-07: qty 1

## 2018-11-07 MED ORDER — DEXTROSE 5 % IV SOLN
Freq: Once | INTRAVENOUS | Status: AC
  Administered 2018-11-07: 13:00:00 via INTRAVENOUS
  Filled 2018-11-07: qty 250

## 2018-11-07 MED ORDER — POTASSIUM CHLORIDE CRYS ER 20 MEQ PO TBCR: 20.0000 meq | EXTENDED_RELEASE_TABLET | Freq: Every day | ORAL | 1 refills | Status: DC

## 2018-11-07 MED ORDER — PALONOSETRON HCL INJECTION 0.25 MG/5ML
0.2500 mg | Freq: Once | INTRAVENOUS | Status: AC
  Administered 2018-11-07: 0.25 mg via INTRAVENOUS

## 2018-11-07 MED ORDER — OXALIPLATIN CHEMO INJECTION 100 MG/20ML
100.0000 mg/m2 | Freq: Once | INTRAVENOUS | Status: AC
  Administered 2018-11-07: 200 mg via INTRAVENOUS
  Filled 2018-11-07: qty 40

## 2018-11-07 MED ORDER — DEXAMETHASONE SODIUM PHOSPHATE 10 MG/ML IJ SOLN
10.0000 mg | Freq: Once | INTRAMUSCULAR | Status: AC
  Administered 2018-11-07: 10 mg via INTRAVENOUS

## 2018-11-07 MED ORDER — PALONOSETRON HCL INJECTION 0.25 MG/5ML
INTRAVENOUS | Status: AC
  Filled 2018-11-07: qty 5

## 2018-11-07 NOTE — Patient Instructions (Signed)
[facility] Discharge Instructions for Patients Receiving Chemotherapy  Today you received the following chemotherapy agents: Oxaliplatin  To help prevent nausea and vomiting after your treatment, we encourage you to take your nausea medication as directed.   If you develop nausea and vomiting that is not controlled by your nausea medication, call the clinic.   BELOW ARE SYMPTOMS THAT SHOULD BE REPORTED IMMEDIATELY:  *FEVER GREATER THAN 100.5 F  *CHILLS WITH OR WITHOUT FEVER  NAUSEA AND VOMITING THAT IS NOT CONTROLLED WITH YOUR NAUSEA MEDICATION  *UNUSUAL SHORTNESS OF BREATH  *UNUSUAL BRUISING OR BLEEDING  TENDERNESS IN MOUTH AND THROAT WITH OR WITHOUT PRESENCE OF ULCERS  *URINARY PROBLEMS  *BOWEL PROBLEMS  UNUSUAL RASH Items with * indicate a potential emergency and should be followed up as soon as possible.  Feel free to call the clinic should you have any questions or concerns. The clinic phone number is [phone].  Please show the CHEMO ALERT CARD at check-in to the Emergency Department and triage nurse.   

## 2018-11-07 NOTE — Progress Notes (Addendum)
[facility] OFFICE PROGRESS NOTE   Diagnosis: Rectal cancer  INTERVAL HISTORY:   Ms. Greear returns as scheduled.  She completed cycle 1 CAPOX beginning 10/17/2018.  She estimates 3 episodes of nausea.  No diarrhea.  No hand or foot pain or redness.  She saw her dentist recently and reports that mouth ulcers were noted.  She did not notice any ulcerations.  She has had a recent "migraine".  She has tried Tylenol, Advil and Vicodin without relief.  She plans to contact her headache doctor.  She had arm pain during the oxaliplatin infusion.  The pain lasted for 1 week.  Objective:  Vital signs in last 24 hours:  Blood pressure 124/75, pulse 89, temperature 98.2 F (36.8 C), temperature source Oral, resp. rate 18, height _0  (1.753 m), weight 183 lb 1.6 oz (83.1 kg), SpO2 100 %.    HEENT: No thrush or ulcers. Resp: Lungs clear bilaterally. Cardio: Regular rate and rhythm. GI: Abdomen soft and nontender.  No hepatomegaly. Vascular: No leg edema.  Skin: Low abdomen surgical incision with a small scabbed area right lateral edge.  No surrounding erythema.   Lab Results:  Lab Results  Component Value Date   WBC 2.9 (L) 11/07/2018   HGB 10.8 (L) 11/07/2018   HCT 32.0 (L) 11/07/2018   MCV 94.7 11/07/2018   PLT 147 (L) 11/07/2018   NEUTROABS 1.6 (L) 11/07/2018    Imaging:  No results found.  Medications: I have reviewed the patient's current medications.  Assessment/Plan: 1. Rectal cancer ? 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 CTs 05/24/2018-mass beginning at proximal he 7.6 cm from the anal verge, "20 "perirectal lymph nodes, no evidence of metastatic disease ? Pelvic MRI 06/01/2018-tumor measured at 10.8 cm from the anal sphincter, T3b, N1-2 left perirectal lymph nodes, each measuring 5 mm ? Radiation and Xeloda initiated 06/13/2018,  completed 07/20/2018 ? Low anterior resection 09/16/2018,ypT3,ypN1c, 1 cm tumor, partial response-score 2, 0/13 lymph nodes positive, 2 tumor deposits, MSI-stable, no loss of mismatch repair protein expression ? Cycle 1 CAPOX 10/17/2018 ? Cycle 2 CAPOX 11/07/2018 (oxaliplatin dose reduced due to neutropenia) 2. Tobacco use 3. 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 Migraine headaches 5. Restless legs 6. Arm pain during the oxaliplatin infusion and for 1 week after cycle 1.  The oxaliplatin will be further diluted and infusion time extended getting with cycle 2. 7. Neutropenia secondary to chemotherapy.  Oxaliplatin dose reduced beginning with cycle 2.   Disposition: Ms. Kissoon appears stable.  She has completed 1 cycle of CAPOX.  Plan to proceed with cycle 2 today as scheduled.  The oxaliplatin will be dose reduced due to mild neutropenia.  She had significant arm pain beginning during the infusion and lasting for approximately 1 week.  The oxaliplatin will be diluted in a larger volume and the infusion time extended beginning with treatment today.  She will return for lab, follow-up and cycle 3 oxaliplatin in 3 weeks.  She will contact the office in the interim with any problems.  We specifically discussed fever, chills, other signs of infection.  Patient seen with Dr. Benay Spice.    Lissa Rowles ANP/GNP-BC   11/07/2018  12:05 PM  This was a shared visit with Ned Card.  Ms. Manzella has mild neutropenia.  She had arm pain with the oxaliplatin infusion.  We decided to dose reduce and a dilute the oxaliplatin  treatment today.  Julieanne Manson, MD

## 2018-11-07 NOTE — Progress Notes (Signed)
Pt had arm pain w/ Oxaliplatin and prefers no port.  Per Ned Card, will dilute Oxali & infuse over 3 hours.  If still w/ pain today, can increase the inf time to 4 hours subsequently. Kennith Center, Pharm.D., CPP 11/07/2018@12 :33 PM

## 2018-11-07 NOTE — Telephone Encounter (Signed)
Left voicemail for Pt to call back [facility].

## 2018-11-07 NOTE — Telephone Encounter (Signed)
TC to pt per Norma Colon to let her know that her potassium is low and instruct her to begin K-Dur 20 meq daily. Prescription to her pharmacy was sent in to [facility] on [address]. Pt had question about magic mouth wash prescription being sent in. Spoke with [facility] and magic mouth wash is not needed. Pt verbalized understanding. No further problems or concerns at this time.

## 2018-11-11 ENCOUNTER — Telehealth: Payer: Self-pay | Admitting: Nurse Practitioner

## 2018-11-11 NOTE — Telephone Encounter (Signed)
Called to confirm appts per 2/3 los - pt is aware of appt date and time

## 2018-11-19 ENCOUNTER — Encounter (HOSPITAL_COMMUNITY): Payer: Self-pay | Admitting: Nurse Practitioner

## 2018-11-19 ENCOUNTER — Emergency Department (HOSPITAL_COMMUNITY)
Admission: EM | Admit: 2018-11-19 | Discharge: 2018-11-20 | Disposition: A | Discharge status: Home / Self Care | Payer: 59 | Source: Home / Self Care | Attending: Emergency Medicine | Admitting: Emergency Medicine

## 2018-11-19 DIAGNOSIS — E876 Hypokalemia: Secondary | ICD-10-CM | POA: Insufficient documentation

## 2018-11-19 DIAGNOSIS — Z800 Family history of malignant neoplasm of digestive organs: Secondary | ICD-10-CM

## 2018-11-19 DIAGNOSIS — R197 Diarrhea, unspecified: Secondary | ICD-10-CM | POA: Insufficient documentation

## 2018-11-19 DIAGNOSIS — F419 Anxiety disorder, unspecified: Secondary | ICD-10-CM | POA: Insufficient documentation

## 2018-11-19 DIAGNOSIS — Z8261 Family history of arthritis: Secondary | ICD-10-CM

## 2018-11-19 DIAGNOSIS — D701 Agranulocytosis secondary to cancer chemotherapy: Secondary | ICD-10-CM

## 2018-11-19 DIAGNOSIS — Z825 Family history of asthma and other chronic lower respiratory diseases: Secondary | ICD-10-CM

## 2018-11-19 DIAGNOSIS — F17210 Nicotine dependence, cigarettes, uncomplicated: Secondary | ICD-10-CM | POA: Insufficient documentation

## 2018-11-19 DIAGNOSIS — D61810 Antineoplastic chemotherapy induced pancytopenia: Secondary | ICD-10-CM | POA: Diagnosis present

## 2018-11-19 DIAGNOSIS — C20 Malignant neoplasm of rectum: Secondary | ICD-10-CM | POA: Diagnosis present

## 2018-11-19 DIAGNOSIS — G4700 Insomnia, unspecified: Secondary | ICD-10-CM | POA: Diagnosis present

## 2018-11-19 DIAGNOSIS — F329 Major depressive disorder, single episode, unspecified: Secondary | ICD-10-CM | POA: Insufficient documentation

## 2018-11-19 DIAGNOSIS — N136 Pyonephrosis: Secondary | ICD-10-CM | POA: Diagnosis present

## 2018-11-19 DIAGNOSIS — Z7982 Long term (current) use of aspirin: Secondary | ICD-10-CM | POA: Insufficient documentation

## 2018-11-19 DIAGNOSIS — Z79899 Other long term (current) drug therapy: Secondary | ICD-10-CM

## 2018-11-19 DIAGNOSIS — Z79891 Long term (current) use of opiate analgesic: Secondary | ICD-10-CM

## 2018-11-19 DIAGNOSIS — R1084 Generalized abdominal pain: Secondary | ICD-10-CM | POA: Insufficient documentation

## 2018-11-19 DIAGNOSIS — K5900 Constipation, unspecified: Secondary | ICD-10-CM | POA: Diagnosis present

## 2018-11-19 DIAGNOSIS — Z823 Family history of stroke: Secondary | ICD-10-CM

## 2018-11-19 DIAGNOSIS — R112 Nausea with vomiting, unspecified: Secondary | ICD-10-CM | POA: Insufficient documentation

## 2018-11-19 DIAGNOSIS — N3001 Acute cystitis with hematuria: Secondary | ICD-10-CM | POA: Diagnosis not present

## 2018-11-19 DIAGNOSIS — Z85048 Personal history of other malignant neoplasm of rectum, rectosigmoid junction, and anus: Secondary | ICD-10-CM

## 2018-11-19 DIAGNOSIS — T451X5A Adverse effect of antineoplastic and immunosuppressive drugs, initial encounter: Secondary | ICD-10-CM | POA: Insufficient documentation

## 2018-11-19 DIAGNOSIS — A419 Sepsis, unspecified organism: Principal | ICD-10-CM | POA: Diagnosis present

## 2018-11-19 DIAGNOSIS — K8020 Calculus of gallbladder without cholecystitis without obstruction: Secondary | ICD-10-CM | POA: Diagnosis present

## 2018-11-19 DIAGNOSIS — M419 Scoliosis, unspecified: Secondary | ICD-10-CM | POA: Insufficient documentation

## 2018-11-19 DIAGNOSIS — A09 Infectious gastroenteritis and colitis, unspecified: Secondary | ICD-10-CM | POA: Diagnosis present

## 2018-11-19 DIAGNOSIS — G2581 Restless legs syndrome: Secondary | ICD-10-CM | POA: Diagnosis present

## 2018-11-19 DIAGNOSIS — Z8249 Family history of ischemic heart disease and other diseases of the circulatory system: Secondary | ICD-10-CM

## 2018-11-19 DIAGNOSIS — Z87442 Personal history of urinary calculi: Secondary | ICD-10-CM

## 2018-11-19 DIAGNOSIS — G43909 Migraine, unspecified, not intractable, without status migrainosus: Secondary | ICD-10-CM | POA: Diagnosis present

## 2018-11-19 DIAGNOSIS — F17200 Nicotine dependence, unspecified, uncomplicated: Secondary | ICD-10-CM | POA: Diagnosis present

## 2018-11-19 DIAGNOSIS — Z923 Personal history of irradiation: Secondary | ICD-10-CM

## 2018-11-19 LAB — URINALYSIS, ROUTINE W REFLEX MICROSCOPIC
Bacteria, UA: NONE SEEN
Bilirubin Urine: NEGATIVE
Glucose, UA: NEGATIVE mg/dL
Ketones, ur: NEGATIVE mg/dL
Leukocytes,Ua: NEGATIVE
Nitrite: NEGATIVE
Protein, ur: NEGATIVE mg/dL
Specific Gravity, Urine: 1.017 (ref 1.005–1.030)
pH: 5 (ref 5.0–8.0)

## 2018-11-19 LAB — CBC WITH DIFFERENTIAL/PLATELET
Abs Immature Granulocytes: 0 10*3/uL (ref 0.00–0.07)
BASOS ABS: 0 10*3/uL (ref 0.0–0.1)
Basophils Relative: 0 %
EOS ABS: 0 10*3/uL (ref 0.0–0.5)
Eosinophils Relative: 2 %
HCT: 37.3 % (ref 36.0–46.0)
Hemoglobin: 12.3 g/dL (ref 12.0–15.0)
Immature Granulocytes: 0 %
Lymphocytes Relative: 26 %
Lymphs Abs: 0.3 10*3/uL — ABNORMAL LOW (ref 0.7–4.0)
MCH: 30.9 pg (ref 26.0–34.0)
MCHC: 33 g/dL (ref 30.0–36.0)
MCV: 93.7 fL (ref 80.0–100.0)
Monocytes Absolute: 0.2 10*3/uL (ref 0.1–1.0)
Monocytes Relative: 14 %
NRBC: 0 % (ref 0.0–0.2)
Neutro Abs: 0.7 10*3/uL — ABNORMAL LOW (ref 1.7–7.7)
Neutrophils Relative %: 58 %
Platelets: 81 10*3/uL — ABNORMAL LOW (ref 150–400)
RBC: 3.98 MIL/uL (ref 3.87–5.11)
RDW: 13.3 % (ref 11.5–15.5)
WBC: 1.2 10*3/uL — CL (ref 4.0–10.5)

## 2018-11-19 LAB — COMPREHENSIVE METABOLIC PANEL
ALT: 46 U/L — ABNORMAL HIGH (ref 0–44)
ANION GAP: 10 (ref 5–15)
AST: 36 U/L (ref 15–41)
Albumin: 3.8 g/dL (ref 3.5–5.0)
Alkaline Phosphatase: 110 U/L (ref 38–126)
BUN: 18 mg/dL (ref 6–20)
CO2: 26 mmol/L (ref 22–32)
Calcium: 8.9 mg/dL (ref 8.9–10.3)
Chloride: 105 mmol/L (ref 98–111)
Creatinine, Ser: 1.07 mg/dL — ABNORMAL HIGH (ref 0.44–1.00)
GFR calc Af Amer: >60 mL/min (ref >60)
GFR calc non Af Amer: 58 mL/min — ABNORMAL LOW (ref >60)
Glucose, Bld: 107 mg/dL — ABNORMAL HIGH (ref 70–99)
Potassium: 2.9 mmol/L — ABNORMAL LOW (ref 3.5–5.1)
SODIUM: 141 mmol/L (ref 135–145)
Total Bilirubin: 2.3 mg/dL — ABNORMAL HIGH (ref 0.3–1.2)
Total Protein: 7.5 g/dL (ref 6.5–8.1)

## 2018-11-19 LAB — LIPASE, BLOOD: Lipase: 26 U/L (ref 11–51)

## 2018-11-19 LAB — C DIFFICILE QUICK SCREEN W PCR REFLEX
C DIFFICILE (CDIFF) INTERP: NOT DETECTED
C DIFFICILE (CDIFF) TOXIN: NEGATIVE
C Diff antigen: NEGATIVE

## 2018-11-19 MED ORDER — DIPHENOXYLATE-ATROPINE 2.5-0.025 MG PO TABS
1.0000 | ORAL_TABLET | Freq: Once | ORAL | Status: AC
  Administered 2018-11-19: 1 via ORAL
  Filled 2018-11-19: qty 1

## 2018-11-19 MED ORDER — HYDROMORPHONE HCL 1 MG/ML IJ SOLN
0.5000 mg | Freq: Once | INTRAMUSCULAR | Status: AC
  Administered 2018-11-19: 0.5 mg via INTRAVENOUS
  Filled 2018-11-19: qty 1

## 2018-11-19 MED ORDER — ONDANSETRON HCL 4 MG/2ML IJ SOLN
4.0000 mg | Freq: Once | INTRAMUSCULAR | Status: AC
  Administered 2018-11-19: 4 mg via INTRAVENOUS
  Filled 2018-11-19: qty 2

## 2018-11-19 MED ORDER — POTASSIUM CHLORIDE CRYS ER 20 MEQ PO TBCR
40.0000 meq | EXTENDED_RELEASE_TABLET | Freq: Once | ORAL | Status: AC
  Administered 2018-11-19: 40 meq via ORAL
  Filled 2018-11-19: qty 2

## 2018-11-19 MED ORDER — SODIUM CHLORIDE 0.9 % IV BOLUS
1000.0000 mL | Freq: Once | INTRAVENOUS | Status: AC
  Administered 2018-11-19: 1000 mL via INTRAVENOUS

## 2018-11-19 MED ORDER — DICYCLOMINE HCL 10 MG/ML IM SOLN
20.0000 mg | Freq: Once | INTRAMUSCULAR | Status: AC
  Administered 2018-11-19: 20 mg via INTRAMUSCULAR
  Filled 2018-11-19: qty 2

## 2018-11-19 MED ORDER — DICYCLOMINE HCL 20 MG PO TABS: 20.0000 mg | ORAL_TABLET | Freq: Two times a day (BID) | ORAL | 0 refills | Status: DC

## 2018-11-19 MED ORDER — POTASSIUM CHLORIDE 10 MEQ/100ML IV SOLN
10.0000 meq | Freq: Once | INTRAVENOUS | Status: AC
  Administered 2018-11-19: 10 meq via INTRAVENOUS
  Filled 2018-11-19: qty 100

## 2018-11-19 NOTE — ED Triage Notes (Signed)
Pt reports she is undergoing combined at home and clinic chemo treatments for rectal cancer. Today she is c/o severe diarrhea, lower abdominal cramping and pain. She states she also feels dehydrated.

## 2018-11-19 NOTE — Discharge Instructions (Addendum)
Your symptoms are likely a side effect of your chemotherapy medications.  Continue using the medications you have at home to manage your symptoms, you can also take Bentyl as needed for cramping abdominal discomfort.  Make sure you are drinking plenty of fluids and advance your diet slowly as tolerated.  Your white blood cell count is low today, this is likely because you are at the end of your chemotherapy cycle, if you have any fevers, cough, or begin to feel worse return to the emergency department for reevaluation.  Call your oncologist and let him know about your white blood cell count and the symptoms you have been experiencing.  Your potassium was low today, this is likely due to your diarrhea, this was replaced here in the emergency department but you should have this level rechecked next week.

## 2018-11-19 NOTE — ED Notes (Signed)
CRITICAL VALUE STICKER  CRITICAL VALUE: WBC  RECEIVER (on-site recipient of call): Earleen Aoun, [facility] NOTIFIED: 11/19/18 @ 22:14  MESSENGER (representative from lab): Merrilyn Puma, [facility]  MD NOTIFIED: Merleen Nicely, ED PA  TIME OF NOTIFICATION: 22:14   RESPONSE: Merleen Nicely verbalize acknowledgement of WBC value. No new orders.

## 2018-11-19 NOTE — ED Provider Notes (Signed)
[facility] DEPT Provider Note   CSN: [id number] Arrival date & time: 11/19/18  1921     History   Chief Complaint Chief Complaint  Patient presents with  . Ca Pt  . Diarrhea    HPI Norma Colon is a 58 y.o. female.  Norma Colon is a 58 y.o. female history of rectal cancer, currently undergoing chemotherapy, migraines, restless leg syndrome, scoliosis, anxiety and depression, who presents to the emergency department for evaluation of nausea, vomiting, diarrhea and abdominal pain.  She reports symptoms started last night and have been persistent and worsening since then.  Patient reports she is at the end of her second cycle of adjuvant chemo after her surgery, she takes Xeloda twice daily, and receives an oxalic platinum infusion once a week.  She reports she had similar symptoms at the end of her first cycle of chemo but today they are worse.  She tried to eat dinner last night and that is when she began having generalized abdominal cramping and pain as well as repeated episodes of watery stools, nonbloody.  Denies focal abdominal pain. She has been a very nauseated with poor appetite and has had a few episodes of vomiting.  She denies any fevers or chills.  No dysuria or urinary frequency.  No chest pain or shortness of breath and no cough.  She attempted to contact Dr. Learta Codding her oncologist regarding the symptoms and was not able to get a hold of him today.  In the past she has taken lomotil to help with diarrhea, last took this at 4am this morning without much relief.     Past Medical History:  Diagnosis Date  . Anxiety   . Arthritis   . Cancer ([place])   . Depression   . Headache    migraines   . HPV in female   . Restless leg syndrome   . Scoliosis     Patient Active Problem List   Diagnosis Date Noted  . Rectal cancer ([place]) 09/16/2018  . 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  COLPOSCOPY  04/04/2018  . left forearm surgery due to calcium rock     . MRI  05/2018   Pelvis- imaging   . SHOULDER ARTHROSCOPY W/ ROTATOR CUFF REPAIR Right   . XI ROBOTIC ASSISTED LOWER ANTERIOR RESECTION N/A 09/16/2018   Procedure: XI ROBOTIC ASSISTED LOWER ANTERIOR RESECTION ERAS PATHWAY;  Surgeon: Leighton Ruff, MD;  Location: [facility] ORS;  Service: General;  Laterality: N/A;     OB History   No obstetric history on file.      Home Medications    Prior to Admission medications   Medication Sig Start Date End Date Taking? Authorizing Provider  alprazolam Duanne Moron) 2 MG tablet Take 2 mg by mouth daily.   Yes [provider]  Ascorbic Acid (VITAMIN C) 1000 MG tablet Take 1,000 mg by mouth daily.   Yes [provider]  aspirin 81 MG tablet Take 81 mg by mouth daily.   Yes [provider]  buPROPion (WELLBUTRIN XL) 300 MG 24 hr tablet Take 300 mg by mouth daily.   Yes [provider]  capecitabine (XELODA) 500 MG tablet Take 4 tablets (2000mg ) in AM & 3 tabs (1500mg ) in PM, immediately after food, for 14 days on, 7 days off, repeat every 21d 10/10/18  Yes Ladell Pier, MD  cyclobenzaprine (FLEXERIL) 10 MG tablet Take 20 mg by mouth  at bedtime.    Yes [provider]  diphenoxylate-atropine (LOMOTIL) 2.5-0.025 MG tablet 1 to 2 PO QID prn diarrhea 10/10/18  Yes Ladell Pier, MD  HYDROcodone-acetaminophen (NORCO/VICODIN) 5-325 MG tablet Take 1 tablet by mouth every 6 (six) hours as needed for moderate pain.   Yes [provider]  nicotine (NICODERM CQ - DOSED IN MG/24 HOURS) 14 mg/24hr patch Place 1 patch (14 mg total) onto the skin daily. 16/10/96  Yes Leighton Ruff, MD  potassium chloride SA (K-DUR,KLOR-CON) 20 MEQ tablet Take 1 tablet (20 mEq total) by mouth daily. 11/07/18  Yes Owens Shark, NP  Probiotic Product (PROBIOTIC PO) Take 1 capsule by mouth daily.   Yes [provider]  prochlorperazine (COMPAZINE) 10 MG  tablet Take 1 tablet (10 mg total) by mouth every 6 (six) hours as needed for nausea or vomiting. 10/10/18  Yes Ladell Pier, MD  rOPINIRole (REQUIP) 0.25 MG tablet Take 0.25 mg by mouth at bedtime.   Yes [provider]  dicyclomine (BENTYL) 20 MG tablet Take 1 tablet (20 mg total) by mouth 2 (two) times daily. 11/19/18   Jacqlyn Larsen, PA-C  mupirocin ointment (BACTROBAN) 2 % Apply to the effected area TID until better Patient not taking: Reported on 11/19/2018 10/17/18   Harle Stanford., PA-C    Family History Family History  Problem Relation Age of Onset  . COPD Father        smoker  . Stomach cancer Father        spread to lungs and bones  . Arthritis Mother   . Heart attack Paternal Grandmother   . Heart attack Paternal Grandfather   . Heart attack Maternal Grandmother   . Stroke Maternal Grandmother   . Colon cancer Neg Hx     Social History Social History   Tobacco Use  . Smoking status: Current Every Day Smoker    Packs/day: 1.00    Years: 30.00    Pack years: 30.00    Types: Cigarettes  . Smokeless tobacco: Never Used  . Tobacco comment: tobacco info given 05/04/18  Substance Use Topics  . Alcohol use: Yes    Comment: social  . Drug use: Never     Allergies   Codeine   Review of Systems Review of Systems  Constitutional: Negative for chills and fever.  HENT: Negative.   Eyes: Negative for visual disturbance.  Respiratory: Negative for cough and shortness of breath.   Cardiovascular: Negative for chest pain.  Gastrointestinal: Positive for abdominal pain, diarrhea, nausea and vomiting. Negative for blood in stool.  Genitourinary: Negative for dysuria, frequency and hematuria.  Musculoskeletal: Negative for arthralgias and myalgias.  Skin: Negative for color change and rash.  Neurological: Negative for dizziness, syncope and light-headedness.     Physical Exam Updated Vital Signs BP 112/74 (BP Location: Left Arm)   Pulse 90   Temp 98.4 F  (36.9 C) (Oral)   Resp 18   LMP  (LMP Unknown)   SpO2 100%   Physical Exam Vitals signs and nursing note reviewed.  Constitutional:      General: She is not in acute distress.    Appearance: Normal appearance. She is well-developed and normal weight. She is not ill-appearing, toxic-appearing or diaphoretic.  HENT:     Head: Normocephalic and atraumatic.     Mouth/Throat:     Mouth: Mucous membranes are moist.     Pharynx: Oropharynx is clear.  Eyes:     General:  Right eye: No discharge.        Left eye: No discharge.     Pupils: Pupils are equal, round, and reactive to light.  Neck:     Musculoskeletal: Neck supple.  Cardiovascular:     Rate and Rhythm: Normal rate and regular rhythm.     Pulses: Normal pulses.     Heart sounds: Normal heart sounds. No murmur. No friction rub. No gallop.   Pulmonary:     Effort: Pulmonary effort is normal. No respiratory distress.     Breath sounds: Normal breath sounds. No wheezing or rales.     Comments: Respirations equal and unlabored, patient able to speak in full sentences, lungs clear to auscultation bilaterally Abdominal:     General: Bowel sounds are normal. There is no distension.     Palpations: Abdomen is soft. There is no mass.     Tenderness: There is abdominal tenderness. There is no guarding.     Comments: Abdomen is soft and nondistended, bowel sounds present throughout, there is mild generalized tenderness throughout the abdomen but no focal area of pain, no rebound tenderness or guarding.  Multiple well-healed surgical scars noted.  Musculoskeletal:        General: No deformity.     Right lower leg: No edema.     Left lower leg: No edema.  Skin:    General: Skin is warm and dry.     Capillary Refill: Capillary refill takes less than 2 seconds.  Neurological:     Mental Status: She is alert.     Coordination: Coordination normal.     Comments: Speech is clear, able to follow commands Moves extremities without  ataxia, coordination intact  Psychiatric:        Mood and Affect: Mood normal.        Behavior: Behavior normal.      ED Treatments / Results  Labs (all labs ordered are listed, but only abnormal results are displayed) Labs Reviewed  COMPREHENSIVE METABOLIC PANEL - Abnormal; Notable for the following components:      Result Value   Potassium 2.9 (*)    Glucose, Bld 107 (*)    Creatinine, Ser 1.07 (*)    ALT 46 (*)    Total Bilirubin 2.3 (*)    GFR calc non Af Amer 58 (*)    All other components within normal limits  CBC WITH DIFFERENTIAL/PLATELET - Abnormal; Notable for the following components:   WBC 1.2 (*)    Platelets 81 (*)    Neutro Abs 0.7 (*)    Lymphs Abs 0.3 (*)    All other components within normal limits  URINALYSIS, ROUTINE W REFLEX MICROSCOPIC - Abnormal; Notable for the following components:   Hgb urine dipstick MODERATE (*)    All other components within normal limits  C DIFFICILE QUICK SCREEN W PCR REFLEX  GASTROINTESTINAL PANEL BY PCR, STOOL (REPLACES STOOL CULTURE)  LIPASE, BLOOD    EKG None  Radiology No results found.  Procedures Procedures (including critical care time)  Medications Ordered in ED Medications  potassium chloride 10 mEq in 100 mL IVPB (10 mEq Intravenous New Bag/Given 11/19/18 2258)  sodium chloride 0.9 % bolus 1,000 mL (0 mLs Intravenous Stopped 11/19/18 2143)  ondansetron (ZOFRAN) injection 4 mg (4 mg Intravenous Given 11/19/18 2056)  dicyclomine (BENTYL) injection 20 mg (20 mg Intramuscular Given 11/19/18 2056)  HYDROmorphone (DILAUDID) injection 0.5 mg (0.5 mg Intravenous Given 11/19/18 2253)  potassium chloride SA (K-DUR,KLOR-CON) CR tablet 40  mEq (40 mEq Oral Given 11/19/18 2253)  diphenoxylate-atropine (LOMOTIL) 2.5-0.025 MG per tablet 1 tablet (1 tablet Oral Given 11/19/18 2335)     Initial Impression / Assessment and Plan / ED Course  I have reviewed the triage vital signs and the nursing notes.  Pertinent labs &  imaging results that were available during my care of the patient were reviewed by me and considered in my medical decision making (see chart for details).  58 year old female currently receiving chemotherapy for rectal cancer, presents to the emergency department for evaluation of generalized abdominal pain, nausea, vomiting and diarrhea.  Symptoms started last night after she tried to eat dinner.  She is at the end of her second chemotherapy cycle and had similar symptoms towards the end of her first cycle.  She reports she has been unable to keep much down today due to symptoms.  While she has normal vitals is afebrile and overall well-appearing.  Abdomen with mild generalized tenderness but no focal area of guarding, exam not suggestive of acute surgical abdomen.  Will check abdominal labs and will give IV fluids, Bentyl, and Zofran for symptomatic management.  With multiple episodes of diarrhea will also check C. difficile and GI pathogen panel to assess for infectious source.  Patient with hypokalemia of 2.9, no other acute electrolyte derangements, creatinine of 1.07 which is close to patient's baseline.  She is receiving IV fluids, will also give 40 of p.o. potassium and 2 rounds of IV potassium.  Normal liver function and lipase.  Patient with white count of 1.2, she is at the end of her chemotherapy cycle, has had neutropenia in the past but not to this degree.  Has never received Neulasta.  Despite neutropenia patient with normal hemoglobin, platelets slightly low at 81.  Patient is afebrile and well-appearing with nonfocal abdominal exam, symptoms are improving with treatment here in the ED and she shows no signs of febrile neutropenia or sepsis.  Urinalysis without signs of infection.  Negative C. difficile testing and GI pathogen panel pending.  Symptoms improving with treatment here in the ED, given Dilaudid for pain and Lomotil to help with diarrhea.  At this time patient reports improvement in  her symptoms and would like to be discharged home. Case discussed with Dr. Wilson Singer who saw and evaluated pt as well and is in agreement with plan, specifically discussed patient's neutropenia but given that patient is well-appearing and afebrile feel that patient is stable for discharge home with strict return precautions and continue symptomatic treatment at home.  Patient to follow-up with her oncology team.  She expresses understanding and agreement with this plan.  Discharged home in good condition.   Final Clinical Impressions(s) / ED Diagnoses   Final diagnoses:  Nausea vomiting and diarrhea  Generalized abdominal pain  Chemotherapy-induced neutropenia ([place])  Hypokalemia    ED Discharge Orders         Ordered    dicyclomine (BENTYL) 20 MG tablet  2 times daily     11/19/18 2322           Janet Berlin 11/21/18 0005    Virgel Manifold, MD 11/22/18 1724

## 2018-11-20 ENCOUNTER — Encounter (HOSPITAL_COMMUNITY): Payer: Self-pay | Admitting: Emergency Medicine

## 2018-11-20 ENCOUNTER — Inpatient Hospital Stay (HOSPITAL_COMMUNITY)
Admission: EM | Admit: 2018-11-20 | Discharge: 2018-11-23 | DRG: 853 | Disposition: A | Discharge status: Home / Self Care | Payer: 59 | Attending: Internal Medicine | Admitting: Internal Medicine

## 2018-11-20 ENCOUNTER — Other Ambulatory Visit: Payer: Self-pay

## 2018-11-20 DIAGNOSIS — N201 Calculus of ureter: Secondary | ICD-10-CM | POA: Diagnosis present

## 2018-11-20 DIAGNOSIS — R6510 Systemic inflammatory response syndrome (SIRS) of non-infectious origin without acute organ dysfunction: Secondary | ICD-10-CM

## 2018-11-20 DIAGNOSIS — G4700 Insomnia, unspecified: Secondary | ICD-10-CM

## 2018-11-20 DIAGNOSIS — N3001 Acute cystitis with hematuria: Secondary | ICD-10-CM

## 2018-11-20 DIAGNOSIS — A419 Sepsis, unspecified organism: Secondary | ICD-10-CM

## 2018-11-20 DIAGNOSIS — N200 Calculus of kidney: Secondary | ICD-10-CM

## 2018-11-20 DIAGNOSIS — R509 Fever, unspecified: Secondary | ICD-10-CM

## 2018-11-20 DIAGNOSIS — N390 Urinary tract infection, site not specified: Secondary | ICD-10-CM | POA: Diagnosis present

## 2018-11-20 DIAGNOSIS — C20 Malignant neoplasm of rectum: Secondary | ICD-10-CM | POA: Diagnosis present

## 2018-11-20 DIAGNOSIS — D61818 Other pancytopenia: Secondary | ICD-10-CM | POA: Diagnosis present

## 2018-11-20 LAB — GASTROINTESTINAL PANEL BY PCR, STOOL (REPLACES STOOL CULTURE)

## 2018-11-20 MED ORDER — LOPERAMIDE HCL 2 MG PO CAPS
4.0000 mg | ORAL_CAPSULE | Freq: Once | ORAL | Status: AC
  Administered 2018-11-21: 4 mg via ORAL
  Filled 2018-11-20: qty 2

## 2018-11-20 MED ORDER — HYDROMORPHONE HCL 1 MG/ML IJ SOLN
1.0000 mg | Freq: Once | INTRAMUSCULAR | Status: AC
  Administered 2018-11-21: 1 mg via INTRAVENOUS
  Filled 2018-11-20: qty 1

## 2018-11-20 MED ORDER — SODIUM CHLORIDE 0.9 % IV BOLUS (SEPSIS): 1000.0000 mL | Freq: Once | INTRAVENOUS | Status: DC

## 2018-11-20 MED ORDER — ONDANSETRON HCL 4 MG/2ML IJ SOLN
4.0000 mg | Freq: Once | INTRAMUSCULAR | Status: AC
  Administered 2018-11-21: 4 mg via INTRAVENOUS
  Filled 2018-11-20: qty 2

## 2018-11-20 NOTE — ED Triage Notes (Signed)
Patient complaining of abdominal pain. Patient states she is not feeling better after taking the medications she was given last night. Patient states she takes Vicodin for migraines.

## 2018-11-20 NOTE — ED Provider Notes (Signed)
TIME SEEN: 11:42 PM  CHIEF COMPLAINT: Abdominal pain, diarrhea  HPI: Patient is a 58 year old female with history of rectal carcinoma status post resection December 13th 2019 currently on chemotherapy who presents to the emergency department with complaints of diffuse cramping abdominal pain, nausea, vomiting and diarrhea.  Last episode of vomiting was yesterday.  Had temperature of 99.9 at home.  States she has had significant amounts of diarrhea without blood.  Increasing pain.  Is on Bentyl and Phenergan at home without relief.  She is receiving IV oxaliplatin every 21 days and taking Xeloda orally.  She is status post radiation.  Reports she is passing gas.  ROS: See HPI Constitutional: no fever  Eyes: no drainage  ENT: no runny nose   Cardiovascular:  no chest pain  Resp: no SOB  GI: Vomiting and diarrhea GU: no dysuria Integumentary: no rash  Allergy: no hives  Musculoskeletal: no leg swelling  Neurological: no slurred speech ROS otherwise negative  PAST MEDICAL HISTORY/PAST SURGICAL HISTORY:  Past Medical History:  Diagnosis Date  . Anxiety   . Arthritis   . Cancer ([place])   . Depression   . Headache    migraines   . HPV in female   . Restless leg syndrome   . Scoliosis     MEDICATIONS:  Prior to Admission medications   Medication Sig Start Date End Date Taking? Authorizing Provider  alprazolam Duanne Moron) 2 MG tablet Take 2 mg by mouth daily.    [provider]  Ascorbic Acid (VITAMIN C) 1000 MG tablet Take 1,000 mg by mouth daily.    [provider]  aspirin 81 MG tablet Take 81 mg by mouth daily.    [provider]  buPROPion (WELLBUTRIN XL) 300 MG 24 hr tablet Take 300 mg by mouth daily.    [provider]  capecitabine (XELODA) 500 MG tablet Take 4 tablets (2000mg ) in AM & 3 tabs (1500mg ) in PM, immediately after food, for 14 days on, 7 days off, repeat every 21d 10/10/18   Ladell Pier, MD  cyclobenzaprine (FLEXERIL) 10 MG tablet  Take 20 mg by mouth at bedtime.     [provider]  dicyclomine (BENTYL) 20 MG tablet Take 1 tablet (20 mg total) by mouth 2 (two) times daily. 11/19/18   Jacqlyn Larsen, PA-C  diphenoxylate-atropine (LOMOTIL) 2.5-0.025 MG tablet 1 to 2 PO QID prn diarrhea 10/10/18   Ladell Pier, MD  HYDROcodone-acetaminophen (NORCO/VICODIN) 5-325 MG tablet Take 1 tablet by mouth every 6 (six) hours as needed for moderate pain.    [provider]  mupirocin ointment (BACTROBAN) 2 % Apply to the effected area TID until better Patient not taking: Reported on 11/19/2018 10/17/18   Harle Stanford., PA-C  nicotine (NICODERM CQ - DOSED IN MG/24 HOURS) 14 mg/24hr patch Place 1 patch (14 mg total) onto the skin daily. 44/03/47   Leighton Ruff, MD  potassium chloride SA (K-DUR,KLOR-CON) 20 MEQ tablet Take 1 tablet (20 mEq total) by mouth daily. 11/07/18   Owens Shark, NP  Probiotic Product (PROBIOTIC PO) Take 1 capsule by mouth daily.    [provider]  prochlorperazine (COMPAZINE) 10 MG tablet Take 1 tablet (10 mg total) by mouth every 6 (six) hours as needed for nausea or vomiting. 10/10/18   Ladell Pier, MD  rOPINIRole (REQUIP) 0.25 MG tablet Take 0.25 mg by mouth at bedtime.    [provider]    ALLERGIES:  Allergies  Allergen  Reactions  . Codeine Other (See Comments)    Causes migraine--patient requests this be kept on allergy list    SOCIAL HISTORY:  Social History   Tobacco Use  . Smoking status: Current Every Day Smoker    Packs/day: 1.00    Years: 30.00    Pack years: 30.00    Types: Cigarettes  . Smokeless tobacco: Never Used  . Tobacco comment: tobacco info given 05/04/18  Substance Use Topics  . Alcohol use: Yes    Comment: social    FAMILY HISTORY: Family History  Problem Relation Age of Onset  . COPD Father        smoker  . Stomach cancer Father        spread to lungs and bones  . Arthritis Mother   . Heart attack Paternal Grandmother   .  Heart attack Paternal Grandfather   . Heart attack Maternal Grandmother   . Stroke Maternal Grandmother   . Colon cancer Neg Hx     EXAM: BP (!) 141/79 (BP Location: Left Arm)   Pulse (!) 114   Temp 97.7 F (36.5 C) (Oral)   Resp 16   Ht 6\' 1"  (1.854 m)   Wt 83.1 kg   LMP  (LMP Unknown)   SpO2 97%   BMI 24.17 kg/m  CONSTITUTIONAL: Alert and oriented and responds appropriately to questions. Well-appearing; well-nourished HEAD: Normocephalic EYES: Conjunctivae clear, pupils appear equal, EOMI ENT: normal nose; moist mucous membranes NECK: Supple, no meningismus, no nuchal rigidity, no LAD  CARD: Giller and tachycardic; S1 and S2 appreciated; no murmurs, no clicks, no rubs, no gallops RESP: Normal chest excursion without splinting or tachypnea; breath sounds clear and equal bilaterally; no wheezes, no rhonchi, no rales, no hypoxia or respiratory distress, speaking full sentences ABD/GI: Normal bowel sounds; non-distended; soft, diffusely tender throughout the abdomen without guarding or rebound BACK:  The back appears normal and is non-tender to palpation, there is no CVA tenderness EXT: Normal ROM in all joints; non-tender to palpation; no edema; normal capillary refill; no cyanosis, no calf tenderness or swelling    SKIN: Normal color for age and race; warm; no rash NEURO: Moves all extremities equally PSYCH: The patient's mood and manner are appropriate. Grooming and personal hygiene are appropriate.  MEDICAL DECISION MAKING: Patient here for nausea, vomiting, diarrhea, abdominal pain.  Was seen in the emergency department yesterday.  Was leukopenic at that time secondary to her chemotherapy but afebrile.  Today she is febrile.  She is also tachycardic.  Given patient meets sirs criteria and could potentially be neutropenic, we will start broad-spectrum IV antibiotics.  Will repeat labs today and obtain a CT of abdomen pelvis.  She is also at risk for bowel obstruction.  Differential  also includes colitis, diverticulitis, UTI, cholecystitis, pancreatitis, appendicitis.  Will give IV fluids, pain and nausea medicine.  Stool studies yesterday were normal including negative C. difficile.  ED PROGRESS: Patient's leukopenia is improving.  She is pancytopenic likely secondary to her chemotherapy.  Lactate normal.  Potassium slightly low at 3.0.  Will give replacement.  Urine appears infected.  We will send urine culture.  She has been given broad-spectrum antibiotics.  CT scan shows a 4 x 4 x 4.3 mm right UPJ stone at the level of the pelvic brim causing market right-sided hydronephrosis.  She is uncomplicated cholelithiasis.  States she was aware of the stone.  She is not having any flank pain today.  Given her urine appears infected and  she has this stone will discuss with urology.   2:45 AM  D/w Case Sherral Hammers, on-call for urology.  They will see patient in consult.  I will keep her n.p.o. at this time.  Will discuss with medicine for admission.  Pain well controlled currently.  3:04 AM Discussed patient's case with hospitalist, Dr. Hal Hope.  I have recommended admission and patient (and family if present) agree with this plan. Admitting physician will place admission orders.   I reviewed all nursing notes, vitals, pertinent previous records, EKGs, lab and urine results, imaging (as available).     Date: 11/21/2018 00:28  Rate: 101  Rhythm: sinus tachycardia  QRS Axis: normal  Intervals: normal  ST/T Wave abnormalities: normal  Conduction Disutrbances: none  Narrative Interpretation: Sinus tachycardia, no old for comparison      CRITICAL CARE Performed by: Pryor Curia   Total critical care time: 65 minutes  Critical care time was exclusive of separately billable procedures and treating other patients.  Critical care was necessary to treat or prevent imminent or life-threatening deterioration.  Critical care was time spent personally by me on the following  activities: development of treatment plan with patient and/or surrogate as well as nursing, discussions with consultants, evaluation of patient's response to treatment, examination of patient, obtaining history from patient or surrogate, ordering and performing treatments and interventions, ordering and review of laboratory studies, ordering and review of radiographic studies, pulse oximetry and re-evaluation of patient's condition.    [name], [name], DO 11/21/18 [id number]

## 2018-11-21 ENCOUNTER — Other Ambulatory Visit: Payer: Self-pay

## 2018-11-21 ENCOUNTER — Inpatient Hospital Stay (HOSPITAL_COMMUNITY): Payer: 59

## 2018-11-21 ENCOUNTER — Emergency Department (HOSPITAL_COMMUNITY): Payer: 59

## 2018-11-21 ENCOUNTER — Encounter (HOSPITAL_COMMUNITY): Payer: Self-pay | Admitting: Internal Medicine

## 2018-11-21 ENCOUNTER — Inpatient Hospital Stay (HOSPITAL_COMMUNITY): Payer: 59 | Admitting: Anesthesiology

## 2018-11-21 ENCOUNTER — Encounter (HOSPITAL_COMMUNITY)
Admission: EM | Disposition: A | Discharge status: Home / Self Care | Payer: Self-pay | Source: Home / Self Care | Attending: Internal Medicine

## 2018-11-21 DIAGNOSIS — F17200 Nicotine dependence, unspecified, uncomplicated: Secondary | ICD-10-CM | POA: Diagnosis present

## 2018-11-21 DIAGNOSIS — E876 Hypokalemia: Secondary | ICD-10-CM | POA: Diagnosis present

## 2018-11-21 DIAGNOSIS — D701 Agranulocytosis secondary to cancer chemotherapy: Secondary | ICD-10-CM

## 2018-11-21 DIAGNOSIS — Z85048 Personal history of other malignant neoplasm of rectum, rectosigmoid junction, and anus: Secondary | ICD-10-CM | POA: Diagnosis not present

## 2018-11-21 DIAGNOSIS — Z79899 Other long term (current) drug therapy: Secondary | ICD-10-CM | POA: Diagnosis not present

## 2018-11-21 DIAGNOSIS — R6510 Systemic inflammatory response syndrome (SIRS) of non-infectious origin without acute organ dysfunction: Secondary | ICD-10-CM | POA: Diagnosis not present

## 2018-11-21 DIAGNOSIS — T451X5A Adverse effect of antineoplastic and immunosuppressive drugs, initial encounter: Secondary | ICD-10-CM | POA: Diagnosis present

## 2018-11-21 DIAGNOSIS — G2581 Restless legs syndrome: Secondary | ICD-10-CM | POA: Diagnosis present

## 2018-11-21 DIAGNOSIS — C20 Malignant neoplasm of rectum: Secondary | ICD-10-CM | POA: Diagnosis present

## 2018-11-21 DIAGNOSIS — N201 Calculus of ureter: Secondary | ICD-10-CM | POA: Diagnosis not present

## 2018-11-21 DIAGNOSIS — Z823 Family history of stroke: Secondary | ICD-10-CM | POA: Diagnosis not present

## 2018-11-21 DIAGNOSIS — N3001 Acute cystitis with hematuria: Secondary | ICD-10-CM | POA: Diagnosis present

## 2018-11-21 DIAGNOSIS — D61810 Antineoplastic chemotherapy induced pancytopenia: Secondary | ICD-10-CM | POA: Diagnosis present

## 2018-11-21 DIAGNOSIS — G43909 Migraine, unspecified, not intractable, without status migrainosus: Secondary | ICD-10-CM | POA: Diagnosis present

## 2018-11-21 DIAGNOSIS — D61818 Other pancytopenia: Secondary | ICD-10-CM | POA: Diagnosis not present

## 2018-11-21 DIAGNOSIS — R197 Diarrhea, unspecified: Secondary | ICD-10-CM | POA: Diagnosis not present

## 2018-11-21 DIAGNOSIS — R5081 Fever presenting with conditions classified elsewhere: Secondary | ICD-10-CM | POA: Diagnosis not present

## 2018-11-21 DIAGNOSIS — M419 Scoliosis, unspecified: Secondary | ICD-10-CM | POA: Diagnosis present

## 2018-11-21 DIAGNOSIS — G4700 Insomnia, unspecified: Secondary | ICD-10-CM | POA: Diagnosis present

## 2018-11-21 DIAGNOSIS — Z825 Family history of asthma and other chronic lower respiratory diseases: Secondary | ICD-10-CM | POA: Diagnosis not present

## 2018-11-21 DIAGNOSIS — Z79891 Long term (current) use of opiate analgesic: Secondary | ICD-10-CM | POA: Diagnosis not present

## 2018-11-21 DIAGNOSIS — N390 Urinary tract infection, site not specified: Secondary | ICD-10-CM | POA: Diagnosis present

## 2018-11-21 DIAGNOSIS — A09 Infectious gastroenteritis and colitis, unspecified: Secondary | ICD-10-CM | POA: Diagnosis present

## 2018-11-21 DIAGNOSIS — F419 Anxiety disorder, unspecified: Secondary | ICD-10-CM | POA: Diagnosis present

## 2018-11-21 DIAGNOSIS — N136 Pyonephrosis: Secondary | ICD-10-CM | POA: Diagnosis present

## 2018-11-21 DIAGNOSIS — A419 Sepsis, unspecified organism: Secondary | ICD-10-CM | POA: Diagnosis present

## 2018-11-21 DIAGNOSIS — K5900 Constipation, unspecified: Secondary | ICD-10-CM | POA: Diagnosis present

## 2018-11-21 DIAGNOSIS — Z8249 Family history of ischemic heart disease and other diseases of the circulatory system: Secondary | ICD-10-CM | POA: Diagnosis not present

## 2018-11-21 DIAGNOSIS — K8020 Calculus of gallbladder without cholecystitis without obstruction: Secondary | ICD-10-CM | POA: Diagnosis present

## 2018-11-21 DIAGNOSIS — N200 Calculus of kidney: Secondary | ICD-10-CM | POA: Diagnosis not present

## 2018-11-21 DIAGNOSIS — F329 Major depressive disorder, single episode, unspecified: Secondary | ICD-10-CM | POA: Diagnosis present

## 2018-11-21 DIAGNOSIS — Z8261 Family history of arthritis: Secondary | ICD-10-CM | POA: Diagnosis not present

## 2018-11-21 DIAGNOSIS — Z800 Family history of malignant neoplasm of digestive organs: Secondary | ICD-10-CM | POA: Diagnosis not present

## 2018-11-21 HISTORY — PX: CYSTOSCOPY WITH STENT PLACEMENT: SHX5790

## 2018-11-21 LAB — URINALYSIS, ROUTINE W REFLEX MICROSCOPIC
BILIRUBIN URINE: NEGATIVE
GLUCOSE, UA: NEGATIVE mg/dL
Ketones, ur: NEGATIVE mg/dL
Nitrite: NEGATIVE
Protein, ur: NEGATIVE mg/dL
Specific Gravity, Urine: 1.017 (ref 1.005–1.030)
pH: 5 (ref 5.0–8.0)

## 2018-11-21 LAB — CBC WITH DIFFERENTIAL/PLATELET
Abs Immature Granulocytes: 0.01 10*3/uL (ref 0.00–0.07)
Abs Immature Granulocytes: 0.01 10*3/uL (ref 0.00–0.07)
Basophils Absolute: 0 10*3/uL (ref 0.0–0.1)
Basophils Absolute: 0 10*3/uL (ref 0.0–0.1)
Basophils Relative: 0 %
Basophils Relative: 0 %
EOS PCT: 1 %
Eosinophils Absolute: 0 10*3/uL (ref 0.0–0.5)
Eosinophils Absolute: 0 10*3/uL (ref 0.0–0.5)
Eosinophils Relative: 1 %
HCT: 30.5 % — ABNORMAL LOW (ref 36.0–46.0)
HEMATOCRIT: 26.9 % — AB (ref 36.0–46.0)
Hemoglobin: 10 g/dL — ABNORMAL LOW (ref 12.0–15.0)
Hemoglobin: 8.7 g/dL — ABNORMAL LOW (ref 12.0–15.0)
Immature Granulocytes: 0 %
Immature Granulocytes: 0 %
Lymphocytes Relative: 10 %
Lymphocytes Relative: 15 %
Lymphs Abs: 0.4 10*3/uL — ABNORMAL LOW (ref 0.7–4.0)
Lymphs Abs: 0.5 10*3/uL — ABNORMAL LOW (ref 0.7–4.0)
MCH: 30.8 pg (ref 26.0–34.0)
MCH: 31.9 pg (ref 26.0–34.0)
MCHC: 32.8 g/dL (ref 30.0–36.0)
MCHC: 32.3 g/dL (ref 30.0–36.0)
MCV: 93.8 fL (ref 80.0–100.0)
MCV: 98.5 fL (ref 80.0–100.0)
Monocytes Absolute: 0.4 10*3/uL (ref 0.1–1.0)
Monocytes Absolute: 0.4 10*3/uL (ref 0.1–1.0)
Monocytes Relative: 12 %
Monocytes Relative: 13 %
NEUTROS PCT: 71 %
NRBC: 0 % (ref 0.0–0.2)
Neutro Abs: 2.6 10*3/uL (ref 1.7–7.7)
Neutro Abs: 2.2 10*3/uL (ref 1.7–7.7)
Neutrophils Relative %: 77 %
Platelets: 87 10*3/uL — ABNORMAL LOW (ref 150–400)
Platelets: 67 10*3/uL — ABNORMAL LOW (ref 150–400)
RBC: 3.25 MIL/uL — ABNORMAL LOW (ref 3.87–5.11)
RBC: 2.73 MIL/uL — ABNORMAL LOW (ref 3.87–5.11)
RDW: 13.2 % (ref 11.5–15.5)
RDW: 13.5 % (ref 11.5–15.5)
WBC: 3.1 10*3/uL — ABNORMAL LOW (ref 4.0–10.5)
WBC: 3.5 10*3/uL — AB (ref 4.0–10.5)
nRBC: 0 % (ref 0.0–0.2)

## 2018-11-21 LAB — GASTROINTESTINAL PANEL BY PCR, STOOL (REPLACES STOOL CULTURE)

## 2018-11-21 LAB — BASIC METABOLIC PANEL
Anion gap: 8 (ref 5–15)
BUN: 10 mg/dL (ref 6–20)
CALCIUM: 7.6 mg/dL — AB (ref 8.9–10.3)
CO2: 20 mmol/L — ABNORMAL LOW (ref 22–32)
Chloride: 110 mmol/L (ref 98–111)
Creatinine, Ser: 0.96 mg/dL (ref 0.44–1.00)
GFR calc Af Amer: >60 mL/min (ref >60)
GFR calc non Af Amer: >60 mL/min (ref >60)
Glucose, Bld: 91 mg/dL (ref 70–99)
Potassium: 3.1 mmol/L — ABNORMAL LOW (ref 3.5–5.1)
Sodium: 138 mmol/L (ref 135–145)

## 2018-11-21 LAB — HEPATIC FUNCTION PANEL
ALT: 32 U/L (ref 0–44)
AST: 25 U/L (ref 15–41)
Albumin: 3.1 g/dL — ABNORMAL LOW (ref 3.5–5.0)
Alkaline Phosphatase: 82 U/L (ref 38–126)
BILIRUBIN INDIRECT: 0.9 mg/dL (ref 0.3–0.9)
Bilirubin, Direct: 0.2 mg/dL (ref 0.0–0.2)
Total Bilirubin: 1.1 mg/dL (ref 0.3–1.2)
Total Protein: 5.9 g/dL — ABNORMAL LOW (ref 6.5–8.1)

## 2018-11-21 LAB — COMPREHENSIVE METABOLIC PANEL
ALK PHOS: 93 U/L (ref 38–126)
ALT: 38 U/L (ref 0–44)
AST: 28 U/L (ref 15–41)
Albumin: 3.8 g/dL (ref 3.5–5.0)
Anion gap: 8 (ref 5–15)
BUN: 14 mg/dL (ref 6–20)
CO2: 25 mmol/L (ref 22–32)
Calcium: 8.7 mg/dL — ABNORMAL LOW (ref 8.9–10.3)
Chloride: 107 mmol/L (ref 98–111)
Creatinine, Ser: 1.11 mg/dL — ABNORMAL HIGH (ref 0.44–1.00)
GFR calc Af Amer: >60 mL/min (ref >60)
GFR calc non Af Amer: 56 mL/min — ABNORMAL LOW (ref >60)
Glucose, Bld: 103 mg/dL — ABNORMAL HIGH (ref 70–99)
Potassium: 3 mmol/L — ABNORMAL LOW (ref 3.5–5.1)
Sodium: 140 mmol/L (ref 135–145)
Total Bilirubin: 1.3 mg/dL — ABNORMAL HIGH (ref 0.3–1.2)
Total Protein: 6.8 g/dL (ref 6.5–8.1)

## 2018-11-21 LAB — LIPASE, BLOOD: Lipase: 26 U/L (ref 11–51)

## 2018-11-21 LAB — LACTIC ACID, PLASMA: Lactic Acid, Venous: 1.4 mmol/L (ref 0.5–1.9)

## 2018-11-21 LAB — HIV ANTIBODY (ROUTINE TESTING W REFLEX): HIV Screen 4th Generation wRfx: NONREACTIVE

## 2018-11-21 SURGERY — CYSTOSCOPY, WITH STENT INSERTION
Anesthesia: General | Laterality: Right

## 2018-11-21 MED ORDER — PROMETHAZINE HCL 25 MG/ML IJ SOLN: 6.2500 mg | INTRAMUSCULAR | Status: DC | PRN

## 2018-11-21 MED ORDER — MIDAZOLAM HCL 5 MG/5ML IJ SOLN
INTRAMUSCULAR | Status: DC | PRN
  Administered 2018-11-21: 2 mg via INTRAVENOUS

## 2018-11-21 MED ORDER — ACETAMINOPHEN 325 MG PO TABS
650.0000 mg | ORAL_TABLET | Freq: Four times a day (QID) | ORAL | Status: DC | PRN
  Administered 2018-11-21 – 2018-11-22 (×2): 650 mg via ORAL
  Filled 2018-11-21 (×2): qty 2

## 2018-11-21 MED ORDER — ONDANSETRON HCL 4 MG/2ML IJ SOLN
INTRAMUSCULAR | Status: DC | PRN
  Administered 2018-11-21: 4 mg via INTRAVENOUS

## 2018-11-21 MED ORDER — METRONIDAZOLE IN NACL 5-0.79 MG/ML-% IV SOLN
500.0000 mg | Freq: Three times a day (TID) | INTRAVENOUS | Status: DC
  Administered 2018-11-21 – 2018-11-22 (×5): 500 mg via INTRAVENOUS
  Filled 2018-11-21 (×5): qty 100

## 2018-11-21 MED ORDER — SCOPOLAMINE 1 MG/3DAYS TD PT72
MEDICATED_PATCH | TRANSDERMAL | Status: AC
  Administered 2018-11-21: 1.5 mg via TRANSDERMAL
  Filled 2018-11-21: qty 1

## 2018-11-21 MED ORDER — SODIUM CHLORIDE 0.9 % IV BOLUS (SEPSIS)
1000.0000 mL | Freq: Once | INTRAVENOUS | Status: AC
  Administered 2018-11-21: 1000 mL via INTRAVENOUS

## 2018-11-21 MED ORDER — FENTANYL CITRATE (PF) 100 MCG/2ML IJ SOLN: 25.0000 ug | INTRAMUSCULAR | Status: DC | PRN

## 2018-11-21 MED ORDER — IOPAMIDOL (ISOVUE-300) INJECTION 61%
INTRAVENOUS | Status: AC
  Filled 2018-11-21: qty 100

## 2018-11-21 MED ORDER — DEXAMETHASONE SODIUM PHOSPHATE 10 MG/ML IJ SOLN
INTRAMUSCULAR | Status: DC | PRN
  Administered 2018-11-21: 8 mg via INTRAVENOUS

## 2018-11-21 MED ORDER — FENTANYL CITRATE (PF) 100 MCG/2ML IJ SOLN
INTRAMUSCULAR | Status: DC | PRN
  Administered 2018-11-21: 50 ug via INTRAVENOUS

## 2018-11-21 MED ORDER — VANCOMYCIN HCL 10 G IV SOLR
1750.0000 mg | Freq: Once | INTRAVENOUS | Status: AC
  Administered 2018-11-21: 1750 mg via INTRAVENOUS
  Filled 2018-11-21: qty 1750

## 2018-11-21 MED ORDER — SODIUM CHLORIDE (PF) 0.9 % IJ SOLN
INTRAMUSCULAR | Status: AC
  Filled 2018-11-21: qty 50

## 2018-11-21 MED ORDER — TAMSULOSIN HCL 0.4 MG PO CAPS
0.4000 mg | ORAL_CAPSULE | Freq: Once | ORAL | Status: AC
  Administered 2018-11-21: 0.4 mg via ORAL
  Filled 2018-11-21: qty 1

## 2018-11-21 MED ORDER — POTASSIUM CHLORIDE CRYS ER 20 MEQ PO TBCR
40.0000 meq | EXTENDED_RELEASE_TABLET | ORAL | Status: AC
  Administered 2018-11-21: 40 meq via ORAL
  Filled 2018-11-21: qty 2

## 2018-11-21 MED ORDER — POTASSIUM CHLORIDE CRYS ER 20 MEQ PO TBCR
40.0000 meq | EXTENDED_RELEASE_TABLET | Freq: Once | ORAL | Status: AC
  Administered 2018-11-21: 40 meq via ORAL
  Filled 2018-11-21: qty 2

## 2018-11-21 MED ORDER — POTASSIUM CHLORIDE 10 MEQ/100ML IV SOLN
10.0000 meq | INTRAVENOUS | Status: AC
  Administered 2018-11-21: 10 meq via INTRAVENOUS
  Filled 2018-11-21: qty 100

## 2018-11-21 MED ORDER — ACETAMINOPHEN 500 MG PO TABS: 1000.0000 mg | ORAL_TABLET | Freq: Once | ORAL | Status: DC

## 2018-11-21 MED ORDER — BUPROPION HCL ER (XL) 300 MG PO TB24
300.0000 mg | ORAL_TABLET | Freq: Every day | ORAL | Status: DC
  Administered 2018-11-21 – 2018-11-23 (×3): 300 mg via ORAL
  Filled 2018-11-21 (×3): qty 1

## 2018-11-21 MED ORDER — SODIUM CHLORIDE 0.9 % IV SOLN
2.0000 g | Freq: Once | INTRAVENOUS | Status: AC
  Administered 2018-11-21: 2 g via INTRAVENOUS
  Filled 2018-11-21: qty 2

## 2018-11-21 MED ORDER — DIPHENHYDRAMINE HCL 50 MG/ML IJ SOLN
INTRAMUSCULAR | Status: AC
  Filled 2018-11-21: qty 1

## 2018-11-21 MED ORDER — SCOPOLAMINE 1 MG/3DAYS TD PT72
1.0000 | MEDICATED_PATCH | TRANSDERMAL | Status: DC
  Administered 2018-11-21: 1.5 mg via TRANSDERMAL

## 2018-11-21 MED ORDER — ONDANSETRON HCL 4 MG/2ML IJ SOLN
INTRAMUSCULAR | Status: AC
  Filled 2018-11-21: qty 2

## 2018-11-21 MED ORDER — LIDOCAINE 2% (20 MG/ML) 5 ML SYRINGE
INTRAMUSCULAR | Status: AC
  Filled 2018-11-21: qty 5

## 2018-11-21 MED ORDER — LACTATED RINGERS IV SOLN
INTRAVENOUS | Status: DC
  Administered 2018-11-21: 12:00:00 via INTRAVENOUS

## 2018-11-21 MED ORDER — LOPERAMIDE HCL 2 MG PO CAPS
2.0000 mg | ORAL_CAPSULE | ORAL | Status: DC | PRN
  Administered 2018-11-21: 2 mg via ORAL
  Filled 2018-11-21: qty 1

## 2018-11-21 MED ORDER — TAMSULOSIN HCL 0.4 MG PO CAPS
0.4000 mg | ORAL_CAPSULE | Freq: Every day | ORAL | Status: DC
  Administered 2018-11-22 – 2018-11-23 (×2): 0.4 mg via ORAL
  Filled 2018-11-21 (×2): qty 1

## 2018-11-21 MED ORDER — FENTANYL CITRATE (PF) 100 MCG/2ML IJ SOLN
INTRAMUSCULAR | Status: AC
  Filled 2018-11-21: qty 2

## 2018-11-21 MED ORDER — VANCOMYCIN HCL IN DEXTROSE 1-5 GM/200ML-% IV SOLN: 1000.0000 mg | Freq: Once | INTRAVENOUS | Status: DC

## 2018-11-21 MED ORDER — ROPINIROLE HCL 0.25 MG PO TABS
0.2500 mg | ORAL_TABLET | Freq: Every day | ORAL | Status: DC
  Administered 2018-11-21 – 2018-11-22 (×2): 0.25 mg via ORAL
  Filled 2018-11-21 (×2): qty 1

## 2018-11-21 MED ORDER — ACETAMINOPHEN 650 MG RE SUPP: 650.0000 mg | Freq: Four times a day (QID) | RECTAL | Status: DC | PRN

## 2018-11-21 MED ORDER — CYCLOBENZAPRINE HCL 10 MG PO TABS
20.0000 mg | ORAL_TABLET | Freq: Every day | ORAL | Status: DC
  Administered 2018-11-21 – 2018-11-22 (×2): 20 mg via ORAL
  Filled 2018-11-21 (×2): qty 2

## 2018-11-21 MED ORDER — STERILE WATER FOR IRRIGATION IR SOLN
Status: DC | PRN
  Administered 2018-11-21: 250 mL

## 2018-11-21 MED ORDER — SODIUM CHLORIDE 0.9 % IV SOLN
INTRAVENOUS | Status: AC
  Administered 2018-11-21 (×3): via INTRAVENOUS

## 2018-11-21 MED ORDER — ONDANSETRON HCL 4 MG/2ML IJ SOLN: 4.0000 mg | Freq: Four times a day (QID) | INTRAMUSCULAR | Status: DC | PRN

## 2018-11-21 MED ORDER — LIDOCAINE HCL (CARDIAC) PF 100 MG/5ML IV SOSY
PREFILLED_SYRINGE | INTRAVENOUS | Status: DC | PRN
  Administered 2018-11-21: 80 mg via INTRAVENOUS

## 2018-11-21 MED ORDER — SODIUM CHLORIDE 0.9 % IV SOLN
2.0000 g | Freq: Three times a day (TID) | INTRAVENOUS | Status: DC
  Administered 2018-11-21 – 2018-11-23 (×7): 2 g via INTRAVENOUS
  Filled 2018-11-21 (×8): qty 2

## 2018-11-21 MED ORDER — NICOTINE 7 MG/24HR TD PT24
7.0000 mg | MEDICATED_PATCH | Freq: Every day | TRANSDERMAL | Status: DC
  Administered 2018-11-21 – 2018-11-23 (×3): 7 mg via TRANSDERMAL
  Filled 2018-11-21 (×3): qty 1

## 2018-11-21 MED ORDER — PROPOFOL 10 MG/ML IV BOLUS
INTRAVENOUS | Status: DC | PRN
  Administered 2018-11-21: 200 mg via INTRAVENOUS

## 2018-11-21 MED ORDER — HYDROMORPHONE HCL 1 MG/ML IJ SOLN
0.5000 mg | INTRAMUSCULAR | Status: DC | PRN
  Administered 2018-11-21 – 2018-11-22 (×3): 0.5 mg via INTRAVENOUS
  Filled 2018-11-21 (×3): qty 0.5

## 2018-11-21 MED ORDER — ACETAMINOPHEN 500 MG PO TABS
1000.0000 mg | ORAL_TABLET | Freq: Once | ORAL | Status: AC
  Administered 2018-11-21: 1000 mg via ORAL
  Filled 2018-11-21: qty 2

## 2018-11-21 MED ORDER — ALPRAZOLAM 1 MG PO TABS
2.0000 mg | ORAL_TABLET | Freq: Every day | ORAL | Status: DC
  Administered 2018-11-21 – 2018-11-23 (×3): 2 mg via ORAL
  Filled 2018-11-21 (×3): qty 2

## 2018-11-21 MED ORDER — SODIUM CHLORIDE 0.9 % IR SOLN
Status: DC | PRN
  Administered 2018-11-21: 3000 mL

## 2018-11-21 MED ORDER — DIPHENHYDRAMINE HCL 50 MG/ML IJ SOLN
INTRAMUSCULAR | Status: DC | PRN
  Administered 2018-11-21: 12.5 mg via INTRAVENOUS

## 2018-11-21 MED ORDER — EPHEDRINE SULFATE 50 MG/ML IJ SOLN
INTRAMUSCULAR | Status: DC | PRN
  Administered 2018-11-21 (×2): 5 mg via INTRAVENOUS

## 2018-11-21 MED ORDER — IOPAMIDOL (ISOVUE-300) INJECTION 61%
100.0000 mL | Freq: Once | INTRAVENOUS | Status: AC | PRN
  Administered 2018-11-21: 100 mL via INTRAVENOUS

## 2018-11-21 MED ORDER — DEXAMETHASONE SODIUM PHOSPHATE 10 MG/ML IJ SOLN
INTRAMUSCULAR | Status: AC
  Filled 2018-11-21: qty 1

## 2018-11-21 MED ORDER — PROPOFOL 10 MG/ML IV BOLUS
INTRAVENOUS | Status: AC
  Filled 2018-11-21: qty 20

## 2018-11-21 MED ORDER — MIDAZOLAM HCL 2 MG/2ML IJ SOLN
INTRAMUSCULAR | Status: AC
  Filled 2018-11-21: qty 2

## 2018-11-21 MED ORDER — EPHEDRINE 5 MG/ML INJ
INTRAVENOUS | Status: AC
  Filled 2018-11-21: qty 10

## 2018-11-21 MED ORDER — ONDANSETRON HCL 4 MG PO TABS: 4.0000 mg | ORAL_TABLET | Freq: Four times a day (QID) | ORAL | Status: DC | PRN

## 2018-11-21 SURGICAL SUPPLY — 18 items
BAG URINE DRAINAGE (UROLOGICAL SUPPLIES) ×3 IMPLANT
BAG URO CATCHER STRL LF (MISCELLANEOUS) ×3 IMPLANT
CATH FOLEY 2WAY SLVR  5CC 16FR (CATHETERS) ×2
CATH FOLEY 2WAY SLVR 5CC 16FR (CATHETERS) ×1 IMPLANT
CATH INTERMIT  6FR 70CM (CATHETERS) ×3 IMPLANT
CLOTH BEACON ORANGE TIMEOUT ST (SAFETY) ×3 IMPLANT
COVER WAND RF STERILE (DRAPES) IMPLANT
GLOVE BIOGEL M 8.0 STRL (GLOVE) ×3 IMPLANT
GOWN STRL REUS W/TWL LRG LVL3 (GOWN DISPOSABLE) ×6 IMPLANT
GUIDEWIRE STR DUAL SENSOR (WIRE) ×3 IMPLANT
GUIDEWIRE ZIPWRE .038 STRAIGHT (WIRE) ×3 IMPLANT
HOLDER FOLEY CATH W/STRAP (MISCELLANEOUS) ×3 IMPLANT
MANIFOLD NEPTUNE II (INSTRUMENTS) ×3 IMPLANT
PACK CYSTO (CUSTOM PROCEDURE TRAY) ×3 IMPLANT
STENT CONTOUR 6FRX26X.038 (STENTS) ×3 IMPLANT
SYR 10ML LL (SYRINGE) ×3 IMPLANT
TUBING CONNECTING 10 (TUBING) ×2 IMPLANT
TUBING CONNECTING 10' (TUBING) ×1

## 2018-11-21 NOTE — Op Note (Signed)
.  Preoperative diagnosis: right mid ureteral calculus, sepsis  Postoperative diagnosis: Same  Procedure: 1 cystoscopy 2. right retrograde pyelography 3.  Intraoperative fluoroscopy, under one hour, with interpretation 4. right 6 x 26 JJ stent placement  Attending: Nicolette Bang  Anesthesia: General  Estimated blood loss: None  Drains: Right 6 x 26 JJ ureteral stent without tether, 16 French foley catheter  Specimens: none  Antibiotics: vancomycin  Findings:right mid ureteral calculus. Severe hydronephrosis. No masses/lesions in the bladder. Ureteral orifices in normal anatomic location.  Indications: Patient is a 58 year old female with a history of right ureteral calculus and concern for sepsis.  After discussing treatment options, they decided proceed with right stent placement.  Procedure her in detail: The patient was brought to the operating room and a brief timeout was done to ensure correct patient, correct procedure, correct site.  General anesthesia was administered patient was placed in dorsal lithotomy position.  Their genitalia was then prepped and draped in usual sterile fashion.  A rigid 17 French cystoscope was passed in the urethra and the bladder.  Bladder was inspected free masses or lesions.  the ureteral orifices were in the normal orthotopic locations.  a 6 french ureteral catheter was then instilled into the right ureteral orifice.  a gentle retrograde was obtained and findings noted above.  we then placed a zip wire through the ureteral catheter and advanced up to the renal pelvis.    We then placed a 6 x 26 double-j ureteral stent over the original zip wire.  We then removed the wire and good coil was noted in the the renal pelvis under fluoroscopy and the bladder under direct vision.  A foley catheter was then placed. the bladder was then drained and this concluded the procedure which was well tolerated by patient.  Complications: None  Condition: Stable,  extubated, transferred to PACU  Plan: Patient is to be admitted for IV antibiotics. SHe will have his stone extraction in 2 weeks.

## 2018-11-21 NOTE — H&P (View-Only) (Signed)
Urology Consult Note   Requesting Attending Physician:  [name], [name], DO Service Providing Consult: Urology  Consulting Attending: Nicolette Bang, MD  Reason for Consult: kidney stone  HPI: Norma Colon is seen in consultation for reasons noted above at the request of [name], [name], DO.  This is a 58 y.o. female with history as below including rectal cancer s/p surgery 2019 and currently on chemotherapy who presents abdominal pain and nausea/vomiting, found to have obstructing 73mm right mid ureteral stone. Urology consulted for assistance with management.  Patient was seen in the ED yesterday for similar complaint and was later discharged.   She reports temperatures at home of 99.4 and nausea/vomiting. No dysuria, hematuria, or urgency/frequency to suggest UTI. Pain currently resolved. She does not believe she has passed her stone based on CT scan from 05/2018. She was aware she had a kidney stone, but has never had problems with kidney stones previously.   Past Medical History: Past Medical History:  Diagnosis Date  . Anxiety   . Arthritis   . Cancer ([place])   . Depression   . Headache    migraines   . HPV in female   . Restless leg syndrome   . Scoliosis     Past Surgical History:  Past Surgical History:  Procedure Laterality Date  . COLPOSCOPY  04/04/2018  . left forearm surgery due to calcium rock     . MRI  05/2018   [facility]   . SHOULDER ARTHROSCOPY W/ ROTATOR CUFF REPAIR Right   . XI ROBOTIC ASSISTED LOWER ANTERIOR RESECTION N/A 09/16/2018   Procedure: XI ROBOTIC ASSISTED LOWER ANTERIOR RESECTION ERAS PATHWAY;  Surgeon: Leighton Ruff, MD;  Location: [facility];  Service: General;  Laterality: N/A;    Medication: Current Facility-Administered Medications  Medication Dose Route Frequency Provider Last Rate Last Dose  . iopamidol (ISOVUE-300) 61 % injection           . metroNIDAZOLE (FLAGYL) IVPB 500 mg  500 mg Intravenous Q8H [name], Kristen N, DO    Stopped at 11/21/18 6553  . sodium chloride (PF) 0.9 % injection           . vancomycin (VANCOCIN) 1,750 mg in sodium chloride 0.9 % 500 mL IVPB  1,750 mg Intravenous Once [name], Kristen N, DO 250 mL/hr at 11/21/18 0233 1,750 mg at 11/21/18 7482   Current Outpatient Medications  Medication Sig Dispense Refill  . alprazolam (XANAX) 2 MG tablet Take 2 mg by mouth daily.    . Ascorbic Acid (VITAMIN C) 1000 MG tablet Take 1,000 mg by mouth daily.    [name] aspirin 81 MG tablet Take 81 mg by mouth daily.    [name] buPROPion (WELLBUTRIN XL) 300 MG 24 hr tablet Take 300 mg by mouth daily.    . capecitabine (XELODA) 500 MG tablet Take 4 tablets (2000mg ) in AM & 3 tabs (1500mg ) in PM, immediately after food, for 14 days on, 7 days off, repeat every 21d 98 tablet 3  . cyclobenzaprine (FLEXERIL) 10 MG tablet Take 20 mg by mouth at bedtime.     . dicyclomine (BENTYL) 20 MG tablet Take 1 tablet (20 mg total) by mouth 2 (two) times daily. 20 tablet 0  . diphenoxylate-atropine (LOMOTIL) 2.5-0.025 MG tablet 1 to 2 PO QID prn diarrhea 40 tablet 1  . HYDROcodone-acetaminophen (NORCO/VICODIN) 5-325 MG tablet Take 1 tablet by mouth every 6 (six) hours as needed for moderate pain.    . mupirocin ointment (BACTROBAN) 2 %  Apply to the effected area TID until better (Patient not taking: Reported on 11/19/2018) 30 g 0  . nicotine (NICODERM CQ - DOSED IN MG/24 HOURS) 14 mg/24hr patch Place 1 patch (14 mg total) onto the skin daily. 28 patch 0  . potassium chloride SA (K-DUR,KLOR-CON) 20 MEQ tablet Take 1 tablet (20 mEq total) by mouth daily. 14 tablet 1  . Probiotic Product (PROBIOTIC PO) Take 1 capsule by mouth daily.    . prochlorperazine (COMPAZINE) 10 MG tablet Take 1 tablet (10 mg total) by mouth every 6 (six) hours as needed for nausea or vomiting. 60 tablet 1  . rOPINIRole (REQUIP) 0.25 MG tablet Take 0.25 mg by mouth at bedtime.      Allergies: Allergies  Allergen Reactions  . Codeine Other (See Comments)    Causes  migraine--patient requests this be kept on allergy list    Social History: Social History   Tobacco Use  . Smoking status: Current Every Day Smoker    Packs/day: 1.00    Years: 30.00    Pack years: 30.00    Types: Cigarettes  . Smokeless tobacco: Never Used  . Tobacco comment: tobacco info given 05/04/18  Substance Use Topics  . Alcohol use: Yes    Comment: social  . Drug use: Never    Family History Family History  Problem Relation Age of Onset  . COPD Father        smoker  . Stomach cancer Father        spread to lungs and bones  . Arthritis Mother   . Heart attack Paternal Grandmother   . Heart attack Paternal Grandfather   . Heart attack Maternal Grandmother   . Stroke Maternal Grandmother   . Colon cancer Neg Hx     Review of Systems 10 systems were reviewed and are negative except as noted specifically in the HPI.  Objective   Vital signs in last 24 hours: BP 128/78   Pulse 99   Temp (!) 100.4 F (38 C) (Rectal)   Resp 17   Ht 6\' 1"  (1.854 m)   Wt 83.1 kg   LMP  (LMP Unknown)   SpO2 95%   BMI 24.17 kg/m   Physical Exam General: NAD, A&O, resting, appropriate HEENT: [place]/AT, EOMI, MMM Pulmonary: Normal work of breathing Cardiovascular: HDS, adequate peripheral perfusion Abdomen: Soft, mild lower abdominal tenderness, nondistended GU: No CVA tenderness Extremities: warm and well perfused Neuro: Appropriate, no focal neurological deficits  Most Recent Labs: Lab Results  Component Value Date   WBC 3.5 (L) 11/21/2018   HGB 10.0 (L) 11/21/2018   HCT 30.5 (L) 11/21/2018   PLT 87 (L) 11/21/2018    Lab Results  Component Value Date   NA 140 11/21/2018   K 3.0 (L) 11/21/2018   CL 107 11/21/2018   CO2 25 11/21/2018   BUN 14 11/21/2018   CREATININE 1.11 (H) 11/21/2018   CALCIUM 8.7 (L) 11/21/2018   MG 1.8 07/18/2018    No results found for: INR, APTT   IMAGING: Ct Abdomen Pelvis W Contrast  Result Date: 11/21/2018 CLINICAL DATA:   Abdominal pain, diarrhea and nausea with vomiting. EXAM: CT ABDOMEN AND PELVIS WITH CONTRAST TECHNIQUE: Multidetector CT imaging of the abdomen and pelvis was performed using the standard protocol following bolus administration of intravenous contrast. CONTRAST:  13mL ISOVUE-300 IOPAMIDOL (ISOVUE-300) INJECTION 61% COMPARISON:  05/24/2018 FINDINGS: Lower chest: Top normal heart size without pericardial effusion or thickening. Clear lung bases with subsegmental atelectasis and/or  scarring on the left. No effusion or pneumothorax. Hepatobiliary: Cholesterol stones noted within the gallbladder without secondary signs of acute cholecystitis. Homogeneous enhancement of the liver without space-occupying mass. No biliary dilatation. Pancreas: No ductal dilatation, mass or inflammation. Spleen: Normal Adrenals/Urinary Tract: Normal bilateral adrenal glands. Marked right-sided hydroureteronephrosis secondary to a 4 x 4 x 4.3 mm ureteral calculus at the pelvic brim, series 4/91 and series 2/59. The urinary bladder is decompressed in appearance. The left renal collecting system and kidney are unremarkable. Stomach/Bowel: Moderate fluid-filled distention of the stomach. Normal small bowel rotation without mechanical bowel obstruction. Moderate stool retention within colon. No CT evidence of acute appendicitis. Staple line across the rectum low anterior resection Vascular/Lymphatic: No significant vascular findings are present. No enlarged abdominal or pelvic lymph nodes. Reproductive: Uterus and bilateral adnexa are unremarkable. Other: No abdominal wall hernia or abnormality. No abdominopelvic ascites. 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 IMPRESSION: 1. There has been interval migration of previously noted right UPJ stone, now seen at the level of the pelvic brim causing marked right-sided hydroureteronephrosis.  2. Uncomplicated cholelithiasis. 3. 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 Electronically Signed   By: Ashley Royalty M.D.   On: 11/21/2018 02:10    ------  Assessment:  58 y.o. female with history of rectal cancer s/p recent resection and on current chemotherapy presenting with abdominal pain found to have 69mm obstructing mid ureteral stone.  Patient with Tmax of 100.4 and mild tachycardia. Current temp 98.4. Normal BPs. Non toxic appearing. WBC 3.5 (yesterday 1.2). UA with negative nitrites, moderate LE, 21-50 WBCs, rare bacteria. She received vanc/cefepime/flagyl. No bothersome urinary symptoms to suggest UTI. Cr is 1.11. No pain at this time.  Discussed options at this time include continued observation with trial of passage versus proceeding with placement of right ureteral stent. Risks and benefits of each were discussed in detail. She prefers for trial of passage at this time which is reasonable given that she is overall clinically well-appearing.   Recommendations: - Agree with admission to medicine team for pain control and antibiotics - Follow up urine culture results - Recommend aggressive IVFs and Flomax. Please strain all urine. - Closely monitor vital signs and contact urology if patient were to show signs/symptoms of systemic infection as this may prompt emergent intervention. - Please keep NPO for now. Urology will reevaluate later this morning to determine whether or not to proceed with placement of right ureteral stent, please keep NPO.   Thank you for this consult. Please contact the urology consult pager with any further questions/concerns.

## 2018-11-21 NOTE — Anesthesia Procedure Notes (Signed)
Procedure Name: LMA Insertion Date/Time: 11/21/2018 1:03 PM Performed by: Glory Buff, CRNA Pre-anesthesia Checklist: Patient identified, Emergency Drugs available, Suction available and Patient being monitored Patient Re-evaluated:Patient Re-evaluated prior to induction Oxygen Delivery Method: Circle system utilized Preoxygenation: Pre-oxygenation with 100% oxygen Induction Type: IV induction Ventilation: Mask ventilation without difficulty LMA: LMA inserted LMA Size: 4.0 Number of attempts: 1 Placement Confirmation: positive ETCO2 Tube secured with: Tape Dental Injury: Teeth and Oropharynx as per pre-operative assessment

## 2018-11-21 NOTE — ED Notes (Signed)
Pt ambulated to BR with no assist. Tolerated well.

## 2018-11-21 NOTE — Progress Notes (Signed)
Patient not yet passed stone Plan for right ureteral stent placement today Please keep patient NPO

## 2018-11-21 NOTE — Anesthesia Preprocedure Evaluation (Addendum)
Anesthesia Evaluation    Reviewed: Allergy & Precautions, Patient's Chart, lab work & pertinent test results  History of Anesthesia Complications Negative for: history of anesthetic complications  Airway Mallampati: II  TM Distance: >3 FB Neck ROM: Full    Dental no notable dental hx. (+) Dental Advisory Given   Pulmonary Current Smoker,    Pulmonary exam normal        Cardiovascular negative cardio ROS Normal cardiovascular exam     Neuro/Psych  Headaches, PSYCHIATRIC DISORDERS Anxiety Depression    GI/Hepatic Neg liver ROS, rectal cancer   Endo/Other  negative endocrine ROS  Renal/GU   negative genitourinary   Musculoskeletal  (+) Arthritis ,   Abdominal   Peds negative pediatric ROS (+)  Hematology  (+) anemia ,   Anesthesia Other Findings   Reproductive/Obstetrics                            Anesthesia Physical  Anesthesia Plan  ASA: II  Anesthesia Plan: General   Post-op Pain Management:    Induction: Intravenous  PONV Risk Score and Plan: 3 and Dexamethasone, Ondansetron and Diphenhydramine  Airway Management Planned: LMA  Additional Equipment: None  Intra-op Plan:   Post-operative Plan: Extubation in OR  Informed Consent: I have reviewed the patients History and Physical, chart, labs and discussed the procedure including the risks, benefits and alternatives for the proposed anesthesia with the patient or authorized representative who has indicated his/her understanding and acceptance.     Dental advisory given  Plan Discussed with: CRNA and Anesthesiologist  Anesthesia Plan Comments:        Anesthesia Quick Evaluation

## 2018-11-21 NOTE — Transfer of Care (Signed)
Immediate Anesthesia Transfer of Care Note  Patient: Norma Colon  Procedure(s) Performed: CYSTOSCOPY, RIGHT RETROGRADE PYELOGRAM, WITH RIGHT URETERAL STENT PLACEMENT (Right )  Patient Location: PACU  Anesthesia Type:General  Level of Consciousness: awake, alert  and oriented  Airway & Oxygen Therapy: Patient Spontanous Breathing and Patient connected to face mask oxygen  Post-op Assessment: Report given to RN and Post -op Vital signs reviewed and stable  Post vital signs: Reviewed and stable  Last Vitals:  Vitals Value Taken Time  BP 111/70 11/21/2018  1:52 PM  Temp    Pulse 82 11/21/2018  1:54 PM  Resp 17 11/21/2018  1:54 PM  SpO2 100 % 11/21/2018  1:54 PM  Vitals shown include unvalidated device data.  Last Pain:  Vitals:   11/21/18 0902  TempSrc:   PainSc: 2       Patients Stated Pain Goal: 2 (65/03/54 6568)  Complications: No apparent anesthesia complications

## 2018-11-21 NOTE — ED Notes (Signed)
Urine and culture sent to lab  

## 2018-11-21 NOTE — ED Notes (Signed)
Pt transported to CT ?

## 2018-11-21 NOTE — Progress Notes (Signed)
[facility]  I have examined the patient and reviewed the chart.   Principal Problem:    Acute lower UTI and sepsis -  Right ureteral stone - s/p stenting for severe hydronephrosis- f/u cultures and continue Maxipime    Active Problems:   Adenocarcinoma of rectum stage 3- Pancytopenia  - receiving chemo per Dr Benay Spice- Xeloda on hold for now- Dr Benay Spice following  Debbe Odea, MD Pager: [email or web address]

## 2018-11-21 NOTE — ED Notes (Signed)
Pt back from CT

## 2018-11-21 NOTE — Progress Notes (Signed)
IP PROGRESS NOTE  Subjective:   Norma Colon is known to me with a history of rectal cancer.  She is currently completing adjuvant chemotherapy.  She began cycle 2 adjuvant CAPOX 11/07/2018.  She reports developing cold sensitivity following chemotherapy.  This has resolved.  She last took Xeloda 11/19/2018 She presented to emergency room 11/19/2018 with diarrhea.  She reports the onset of diarrhea 11/18/2018.  She had associated nausea and "cramping "abdominal pain.  She was treated with intravenous fluids and Lomotil and discharged home.  She was prescribed dicyclomine. She reports the diarrhea returned on 11/20/2018 and she presented to the emergency room last night.  She complained of nausea, diarrhea, and abdominal pain. A CT of the abdomen revealed marked right hydronephrosis secondary to a stone at the pelvic brim.  The left kidney appeared normal.  Moderate stool within the colon.  She had a fever after hospital admission last night.  Objective: Vital signs in last 24 hours: Blood pressure 126/69, pulse 93, temperature 98.2 F (36.8 C), temperature source Oral, resp. rate 16, height _0  (1.854 m), weight 186 lb 11.2 oz (84.7 kg), SpO2 99 %.  Intake/Output from previous day: 02/16 0701 - 02/17 0700 In: 2998.8 [I.V.:198.8; IV Piggyback:2800] Out: 300 [Urine:300]  Physical Exam:  HEENT: No thrush or ulcers Lungs: End inspiratory rales at the left posterior base, no respiratory distress Cardiac: Regular rate and rhythm Abdomen: Soft, no hepatosplenomegaly, mild diffuse tenderness Extremities: No leg edema Skin: Palms and soles without erythema    Lab Results: Recent Labs    11/21/18 0007 11/21/18 0559  WBC 3.5* 3.1*  HGB 10.0* 8.7*  HCT 30.5* 26.9*  PLT 87* 67*    BMET Recent Labs    11/21/18 0007 11/21/18 0559  NA 140 138  K 3.0* 3.1*  CL 107 110  CO2 25 20*  GLUCOSE 103* 91  BUN 14 10  CREATININE 1.11* 0.96  CALCIUM 8.7* 7.6*    No results found for:  CEA1  Studies/Results: Ct Abdomen Pelvis W Contrast  Result Date: 11/21/2018 CLINICAL DATA:  Abdominal pain, diarrhea and nausea with vomiting. EXAM: CT ABDOMEN AND PELVIS WITH CONTRAST TECHNIQUE: Multidetector CT imaging of the abdomen and pelvis was performed using the standard protocol following bolus administration of intravenous contrast. CONTRAST:  166m ISOVUE-300 IOPAMIDOL (ISOVUE-300) INJECTION 61% COMPARISON:  05/24/2018 FINDINGS: Lower chest: Top normal heart size without pericardial effusion or thickening. Clear lung bases with subsegmental atelectasis and/or scarring on the left. No effusion or pneumothorax. Hepatobiliary: Cholesterol stones noted within the gallbladder without secondary signs of acute cholecystitis. Homogeneous enhancement of the liver without space-occupying mass. No biliary dilatation. Pancreas: No ductal dilatation, mass or inflammation. Spleen: Normal Adrenals/Urinary Tract: Normal bilateral adrenal glands. Marked right-sided hydroureteronephrosis secondary to a 4 x 4 x 4.3 mm ureteral calculus at the pelvic brim, series 4/91 and series 2/59. The urinary bladder is decompressed in appearance. The left renal collecting system and kidney are unremarkable. Stomach/Bowel: Moderate fluid-filled distention of the stomach. Normal small bowel rotation without mechanical bowel obstruction. Moderate stool retention within colon. No CT evidence of acute appendicitis. Staple line across the rectum low anterior resection Vascular/Lymphatic: No significant vascular findings are present. No enlarged abdominal or pelvic lymph nodes. Reproductive: Uterus and bilateral adnexa are unremarkable. Other: No abdominal wall hernia or abnormality. No abdominopelvic ascites. 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 IMPRESSION: 1. There has been interval  migration of previously noted right UPJ  stone, now seen at the level of the pelvic brim causing marked right-sided hydroureteronephrosis. 2. Uncomplicated cholelithiasis. 3. 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 Electronically Signed   By: Ashley Royalty M.D.   On: 11/21/2018 02:10    Medications: I have reviewed the patient's current medications.  Assessment/Plan:  1. Rectal cancer ? 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 CTs 05/24/2018-mass beginning at proximal he 7.6 cm from the anal verge, "20 "perirectal lymph nodes, no evidence of metastatic disease ? Pelvic MRI 06/01/2018-tumor measured at 10.8 cm from the anal sphincter, T3b, N1-2 left perirectal lymph nodes, each measuring 5 mm ? Radiation and Xeloda initiated 06/13/2018, completed 07/20/2018 ? Low anterior resection 09/16/2018,ypT3,ypN1c, 1 cm tumor, partial response-score 2, 0/13 lymph nodes positive, 2 tumor deposits, MSI-stable, no loss of mismatch repair protein expression ? Cycle 1 CAPOX 10/17/2018 ? Cycle 2 CAPOX 11/07/2018 (oxaliplatin dose reduced due to neutropenia), Xeloda discontinued 11/19/2018 secondary to diarrhea 2. Tobacco use 3. Right UPJ stone on CT 05/24/2018  CT 11/21/2018-migration of the right UPJ stone, now at the level of the pelvic brim with marked right hydroureteronephrosis 4. Migraine headaches 5. Restless legs 6. Arm pain during the oxaliplatin infusion and for 1 week after cycle 1.  The oxaliplatin will be further diluted and infusion time extended getting with cycle 2. 7. Neutropenia secondary to chemotherapy.  Oxaliplatin dose reduced beginning with cycle 2. 8. Fever 11/21/2018-likely secondary to a urinary tract infection 9. Diarrhea secondary to capecitabine  Norma Colon is now at 75 following cycle 2 CAPOX given as adjuvant  treatment for stage III rectal cancer.  She is admitted with diarrhea, abdominal pain, nausea, and a fever.  The diarrhea is likely secondary to capecitabine induced enteritis.  The fever may be secondary to enteritis versus the right hydronephrosis/urinary infection.  Recommendations: 1.  Imodium/Lomotil for diarrhea, consider adding tincture of opium if the diarrhea does not improve over the next few days 2.  Intravenous hydration, replete potassium per the medical service 3.  Management of the right kidney stone per urology 4.  Antibiotics, follow-up cultures 5.  Hold final scheduled day of Xeloda with this cycle 6.  Follow-up as scheduled at the Cancer center 11/28/2018 7.  Please call Oncology as needed     LOS: 0 days   Betsy Coder, MD   11/21/2018, 7:58 AM

## 2018-11-21 NOTE — Progress Notes (Signed)
A consult was received from an ED physician for Vancomycin, cefepime per pharmacy dosing.  The patient's profile has been reviewed for ht/wt/allergies/indication/available labs.   A one time order has been placed for Vancomycin 1gm iv x1, and Cefepime 2gm iv x1.  Further antibiotics/pharmacy consults should be ordered by admitting physician if indicated.                       Thank you, Nani Skillern Crowford 11/21/2018  12:24 AM

## 2018-11-21 NOTE — Progress Notes (Signed)
Pharmacy Antibiotic Note  Norma Colon is a 58 y.o. female admitted on 11/20/2018 with sepsis.  Pharmacy has been consulted for Cefepime dosing.  Plan: Vancomycin 1.5gm iv x1, then d/c Cefepime 2gm iv x1, then 2gm iv q8hr   Height: 6\' 1"  (185.4 cm) Weight: 183 lb 3.2 oz (83.1 kg) IBW/kg (Calculated) : 75.4  Temp (24hrs), Avg:98.7 F (37.1 C), Min:97.7 F (36.5 C), Max:100.4 F (38 C)  Recent Labs  Lab 11/19/18 2046 11/21/18 0007 11/21/18 0015  WBC 1.2* 3.5*  --   CREATININE 1.07* 1.11*  --   LATICACIDVEN  --   --  1.4    Estimated Creatinine Clearance: 68.2 mL/min (A) (by C-G formula based on SCr of 1.11 mg/dL (H)).    Allergies  Allergen Reactions  . Codeine Other (See Comments)    Causes migraine--patient requests this be kept on allergy list    Antimicrobials this admission: Vancomycin 11/21/2018 x1 Cefepime 11/21/2018 >>   Dose adjustments this admission: -  Microbiology results: -  Thank you for allowing pharmacy to be a part of this patient's care.  Nani Skillern Crowford 11/21/2018 5:20 AM

## 2018-11-21 NOTE — ED Notes (Signed)
ED TO INPATIENT HANDOFF REPORT  Name/Age/Gender Norma Colon 58 y.o. female  Code Status    Code Status Orders  (From admission, onward)         Start     Ordered   11/21/18 0405  Full code  Continuous     11/21/18 0405        Code Status History    Date Active Date Inactive Code Status Order ID Comments User Context   09/16/2018 1837 09/20/2018 1330 Full Code [id number]  Leighton Ruff, MD Inpatient      Home/SNF/Other Home  Chief Complaint abdominal pain  Level of Care/Admitting Diagnosis ED Disposition    ED Disposition Condition [facility] Area: [place] [[id number]]  Level of Care: Telemetry [5]  Admit to tele based on following criteria: Monitor for Ischemic changes  Diagnosis: SIRS (systemic inflammatory response syndrome) [facility]) [[id number]]  Admitting Physician: Rise Patience [phone]  Attending Physician: Rise Patience [phone]  Estimated length of stay: past midnight tomorrow  Certification:: I certify this patient will need inpatient services for at least 2 midnights  PT Class (Do Not Modify): Inpatient [101]  PT Acc Code (Do Not Modify): Private [1]       Medical History Past Medical History:  Diagnosis Date  . Anxiety   . Arthritis   . Cancer ([place])   . Depression   . Headache    migraines   . HPV in female   . Restless leg syndrome   . Scoliosis     Allergies Allergies  Allergen Reactions  . Codeine Other (See Comments)    Causes migraine--patient requests this be kept on allergy list    IV Location/Drains/Wounds Patient Lines/Drains/Airways Status   Active Line/Drains/Airways    Name:   Placement date:   Placement time:   Site:   Days:   Peripheral IV 11/21/18 Left Antecubital   11/21/18    0030    Antecubital   less than 1   Incision (Closed) 09/16/18 Abdomen   09/16/18    1205     66   Incision - 6 Ports Abdomen Mid;Upper Right;Medial Right;Lateral Right;Lower Left;Medial    09/16/18     1020     66          Labs/Imaging Results for orders placed or performed during the hospital encounter of 11/20/18 (from the past 48 hour(s))  CBC with Differential     Status: Abnormal   Collection Time: 11/21/18 12:07 AM  Result Value Ref Range   WBC 3.5 (L) 4.0 - 10.5 K/uL   RBC 3.25 (L) 3.87 - 5.11 MIL/uL   Hemoglobin 10.0 (L) 12.0 - 15.0 g/dL   HCT 30.5 (L) 36.0 - 46.0 %   MCV 93.8 80.0 - 100.0 fL   MCH 30.8 26.0 - 34.0 pg   MCHC 32.8 30.0 - 36.0 g/dL   RDW 13.2 11.5 - 15.5 %   Platelets 87 (L) 150 - 400 K/uL    Comment: Immature Platelet Fraction may be clinically indicated, consider ordering this additional test [id number]    nRBC 0.0 0.0 - 0.2 %   Neutrophils Relative % 77 %   Neutro Abs 2.6 1.7 - 7.7 K/uL   Lymphocytes Relative 10 %   Lymphs Abs 0.4 (L) 0.7 - 4.0 K/uL   Monocytes Relative 12 %   Monocytes Absolute 0.4 0.1 - 1.0 K/uL   Eosinophils Relative 1 %   Eosinophils Absolute 0.0 0.0 -  0.5 K/uL   Basophils Relative 0 %   Basophils Absolute 0.0 0.0 - 0.1 K/uL   Immature Granulocytes 0 %   Abs Immature Granulocytes 0.01 0.00 - 0.07 K/uL    Comment: Performed at [facility], [place] [address], [place], [place] [address]  Comprehensive metabolic panel     Status: Abnormal   Collection Time: 11/21/18 12:07 AM  Result Value Ref Range   Sodium 140 135 - 145 mmol/L   Potassium 3.0 (L) 3.5 - 5.1 mmol/L   Chloride 107 98 - 111 mmol/L   CO2 25 22 - 32 mmol/L   Glucose, Bld 103 (H) 70 - 99 mg/dL   BUN 14 6 - 20 mg/dL   Creatinine, Ser 1.11 (H) 0.44 - 1.00 mg/dL   Calcium 8.7 (L) 8.9 - 10.3 mg/dL   Total Protein 6.8 6.5 - 8.1 g/dL   Albumin 3.8 3.5 - 5.0 g/dL   AST 28 15 - 41 U/L   ALT 38 0 - 44 U/L   Alkaline Phosphatase 93 38 - 126 U/L   Total Bilirubin 1.3 (H) 0.3 - 1.2 mg/dL   GFR calc non Af Amer 56 (L) >60 mL/min   GFR calc Af Amer >60 >60 mL/min   Anion gap 8 5 - 15    Comment: Performed at [facility], [place]  [address], [place], [place] [address]  Lipase, blood     Status: None   Collection Time: 11/21/18 12:07 AM  Result Value Ref Range   Lipase 26 11 - 51 U/L    Comment: Performed at [facility], [place] [address], [place], [place] [address]  Urinalysis, Routine w reflex microscopic     Status: Abnormal   Collection Time: 11/21/18 12:07 AM  Result Value Ref Range   Color, Urine YELLOW YELLOW   APPearance CLEAR CLEAR   Specific Gravity, Urine 1.017 1.005 - 1.030   pH 5.0 5.0 - 8.0   Glucose, UA NEGATIVE NEGATIVE mg/dL   Hgb urine dipstick SMALL (A) NEGATIVE   Bilirubin Urine NEGATIVE NEGATIVE   Ketones, ur NEGATIVE NEGATIVE mg/dL   Protein, ur NEGATIVE NEGATIVE mg/dL   Nitrite NEGATIVE NEGATIVE   Leukocytes,Ua MODERATE (A) NEGATIVE   RBC / HPF 21-50 0 - 5 RBC/hpf   WBC, UA 21-50 0 - 5 WBC/hpf   Bacteria, UA RARE (A) NONE SEEN   Squamous Epithelial / LPF 0-5 0 - 5   Mucus PRESENT     Comment: Performed at [facility], [place] [address], [place], [place] [address]  Lactic acid, plasma     Status: None   Collection Time: 11/21/18 12:15 AM  Result Value Ref Range   Lactic Acid, Venous 1.4 0.5 - 1.9 mmol/L    Comment: Performed at [facility], [place] [address], [place], [place] [address]   Ct Abdomen Pelvis W Contrast  Result Date: 11/21/2018 CLINICAL DATA:  Abdominal pain, diarrhea and nausea with vomiting. EXAM: CT ABDOMEN AND PELVIS WITH CONTRAST TECHNIQUE: Multidetector CT imaging of the abdomen and pelvis was performed using the standard protocol following bolus administration of intravenous contrast. CONTRAST:  122mL ISOVUE-300 IOPAMIDOL (ISOVUE-300) INJECTION 61% COMPARISON:  05/24/2018 FINDINGS: Lower chest: Top normal heart size without pericardial effusion or thickening. Clear lung bases with subsegmental atelectasis and/or scarring on the left. No effusion or pneumothorax. Hepatobiliary: Cholesterol stones noted within the  gallbladder without secondary signs of acute cholecystitis. Homogeneous enhancement of the liver without space-occupying mass.  No biliary dilatation. Pancreas: No ductal dilatation, mass or inflammation. Spleen: Normal Adrenals/Urinary Tract: Normal bilateral adrenal glands. Marked right-sided hydroureteronephrosis secondary to a 4 x 4 x 4.3 mm ureteral calculus at the pelvic brim, series 4/91 and series 2/59. The urinary bladder is decompressed in appearance. The left renal collecting system and kidney are unremarkable. Stomach/Bowel: Moderate fluid-filled distention of the stomach. Normal small bowel rotation without mechanical bowel obstruction. Moderate stool retention within colon. No CT evidence of acute appendicitis. Staple line across the rectum low anterior resection Vascular/Lymphatic: No significant vascular findings are present. No enlarged abdominal or pelvic lymph nodes. Reproductive: Uterus and bilateral adnexa are unremarkable. Other: No abdominal wall hernia or abnormality. No abdominopelvic ascites. 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 IMPRESSION: 1. There has been interval migration of previously noted right UPJ stone, now seen at the level of the pelvic brim causing marked right-sided hydroureteronephrosis. 2. Uncomplicated cholelithiasis. 3. 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 Electronically Signed   By: Ashley Royalty M.D.   On: 11/21/2018 02:10   None  Pending Labs Unresulted Labs (From admission, onward)    Start     Ordered   11/21/18 0500  HIV antibody (Routine Testing)  Tomorrow morning,   R     11/21/18 0405   11/21/18 7793  Basic metabolic panel  Tomorrow morning,   R     11/21/18 0405   11/21/18 0500  Hepatic function panel  Tomorrow morning,   R     11/21/18 0405    11/21/18 0500  CBC WITH DIFFERENTIAL  Tomorrow morning,   R     11/21/18 0405   11/21/18 0134  Urine culture  ONCE - STAT,   STAT     11/21/18 0133   11/21/18 0015  Blood culture (routine x 2)  BLOOD CULTURE X 2,   STAT     11/21/18 0014          Vitals/Pain Today's Vitals   11/21/18 0009 11/21/18 0112 11/21/18 0145 11/21/18 0326  BP:   128/78 105/61  Pulse:   99 100  Resp:   17 (!) 26  Temp: (!) 100.4 F (38 C)   98.4 F (36.9 C)  TempSrc: Rectal   Oral  SpO2:   95% 100%  Weight:      Height:      PainSc:  0-No pain  3     Isolation Precautions No active isolations  Medications Medications  metroNIDAZOLE (FLAGYL) IVPB 500 mg (0 mg Intravenous Stopped 11/21/18 0233)  vancomycin (VANCOCIN) 1,750 mg in sodium chloride 0.9 % 500 mL IVPB (1,750 mg Intravenous New Bag/Given 11/21/18 0233)  iopamidol (ISOVUE-300) 61 % injection (has no administration in time range)  sodium chloride (PF) 0.9 % injection (has no administration in time range)  potassium chloride 10 mEq in 100 mL IVPB (10 mEq Intravenous New Bag/Given 11/21/18 0335)  acetaminophen (TYLENOL) tablet 650 mg (has no administration in time range)    Or  acetaminophen (TYLENOL) suppository 650 mg (has no administration in time range)  ondansetron (ZOFRAN) tablet 4 mg (has no administration in time range)    Or  ondansetron (ZOFRAN) injection 4 mg (has no administration in time range)  0.9 %  sodium chloride infusion (has no administration in time range)  tamsulosin (FLOMAX) capsule 0.4 mg (  has no administration in time range)  HYDROmorphone (DILAUDID) injection 1 mg (1 mg Intravenous Given 11/21/18 0035)  ondansetron (ZOFRAN) injection 4 mg (4 mg Intravenous Given 11/21/18 0034)  loperamide (IMODIUM) capsule 4 mg (4 mg Oral Given 11/21/18 0033)  acetaminophen (TYLENOL) tablet 1,000 mg (1,000 mg Oral Given 11/21/18 0032)  ceFEPIme (MAXIPIME) 2 g in sodium chloride 0.9 % 100 mL IVPB (0 g Intravenous Stopped 11/21/18 0111)   sodium chloride 0.9 % bolus 1,000 mL (0 mLs Intravenous Stopped 11/21/18 0328)    And  sodium chloride 0.9 % bolus 1,000 mL (1,000 mLs Intravenous New Bag/Given 11/21/18 0111)    And  sodium chloride 0.9 % bolus 1,000 mL (0 mLs Intravenous Stopped 11/21/18 0149)  iopamidol (ISOVUE-300) 61 % injection 100 mL (100 mLs Intravenous Contrast Given 11/21/18 0135)  tamsulosin (FLOMAX) capsule 0.4 mg (0.4 mg Oral Given 11/21/18 0333)    Mobility walks

## 2018-11-21 NOTE — Anesthesia Postprocedure Evaluation (Signed)
Anesthesia Post Note  Patient: Norma Colon  Procedure(s) Performed: CYSTOSCOPY, RIGHT RETROGRADE PYELOGRAM, WITH RIGHT URETERAL STENT PLACEMENT (Right )     Patient location during evaluation: PACU Anesthesia Type: General Level of consciousness: sedated Pain management: pain level controlled Vital Signs Assessment: post-procedure vital signs reviewed and stable Respiratory status: spontaneous breathing and respiratory function stable Cardiovascular status: stable Postop Assessment: no apparent nausea or vomiting Anesthetic complications: no    Last Vitals:  Vitals:   11/21/18 1400 11/21/18 1415  BP: 106/67 106/80  Pulse: 81 75  Resp: 18 (!) 22  Temp:  36.5 C  SpO2: 100% 100%    Last Pain:  Vitals:   11/21/18 1415  TempSrc:   PainSc: 0-No pain                 Jaquil Todt DANIEL

## 2018-11-21 NOTE — H&P (Signed)
History and Physical    Norma Colon JKD:[id number] DOB: 05/25/1961 DOA: 11/20/2018  PCP: Dian Queen, MD  Patient coming from: Home.  Chief Complaint: Abdominal pain and diarrhea.  HPI: Norma Colon is a 58 y.o. 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  Denies using any recent antibiotics.  Denies any recent sick contacts or travel.  Patient stopped taking her medication for chemo 2 days ago after patient start developing the symptoms.  Denies vomiting.  Had come to the ER 2 days ago was managed symptomatically and discharged home.  ED Course: In the ER patient was mildly febrile tachycardic labs showing pancytopenia.  C. difficile was done during last visit 24 hours ago was negative.  CAT scan shows patient is known right ureteric stone moved to the brain at this time with marked right-sided hydronephrosis.  On-call urologist was consulted.  UA is concerning for UTI and at this time urology recommended Flomax hydration and antibiotics.  If patient develops any signs of severe sepsis will need intervention for the stone.  To keep n.p.o. for now.  Review of Systems: As per HPI, rest all negative.   Past Medical History:  Diagnosis Date  . Anxiety   . Arthritis   . Cancer ([place])   . Depression   . Headache    migraines   . HPV in female   . Restless leg syndrome   . Scoliosis     Past Surgical History:  Procedure Laterality Date  . COLPOSCOPY  04/04/2018  . left forearm surgery due to calcium rock     . MRI  05/2018   [facility]   . SHOULDER ARTHROSCOPY W/ ROTATOR CUFF REPAIR Right   . XI ROBOTIC ASSISTED LOWER ANTERIOR RESECTION N/A 09/16/2018   Procedure: XI ROBOTIC ASSISTED LOWER ANTERIOR RESECTION ERAS PATHWAY;  Surgeon: Leighton Ruff, MD;  Location:  [facility];  Service: General;  Laterality: N/A;     reports that she has been smoking cigarettes. She has a 30.00 pack-year smoking history. She has never used smokeless tobacco. She reports current alcohol use. She reports that she does not use drugs.  Allergies  Allergen Reactions  . Codeine Other (See Comments)    Causes migraine--patient requests this be kept on allergy list    Family History  Problem Relation Age of Onset  . COPD Father        smoker  . Stomach cancer Father        spread to lungs and bones  . Arthritis Mother   . Heart attack Paternal Grandmother   . Heart attack Paternal Grandfather   . Heart attack Maternal Grandmother   . Stroke Maternal Grandmother   . Colon cancer Neg Hx     Prior to Admission medications   Medication Sig Start Date End Date Taking? Authorizing Provider  alprazolam Duanne Moron) 2 MG tablet Take 2 mg by mouth daily after breakfast.    Yes [provider]  Ascorbic Acid (VITAMIN C) 1000 MG tablet Take 1,000 mg by mouth daily.   Yes [provider]  aspirin 81 MG tablet Take 81 mg by mouth daily.   Yes [provider]  bismuth subsalicylate (PEPTO BISMOL) 262 MG chewable tablet Chew 524 mg by mouth as needed for indigestion or diarrhea or loose stools.  Yes [provider]  buPROPion (WELLBUTRIN XL) 300 MG 24 hr tablet Take 300 mg by mouth daily after breakfast.    Yes [provider]  capecitabine (XELODA) 500 MG tablet Take 4 tablets (2000mg ) in AM & 3 tabs (1500mg ) in PM, immediately after food, for 14 days on, 7 days off, repeat every 21d 10/10/18  Yes Ladell Pier, MD  cyclobenzaprine (FLEXERIL) 10 MG tablet Take 20 mg by mouth at bedtime.    Yes [provider]  dicyclomine (BENTYL) 20 MG tablet Take 1 tablet (20 mg total) by mouth 2 (two) times daily. 11/19/18  Yes Jacqlyn Larsen, PA-C  diphenoxylate-atropine (LOMOTIL) 2.5-0.025 MG tablet 1 to 2 PO QID prn diarrhea Patient taking  differently: Take 1-2 tablets by mouth 4 (four) times daily as needed for diarrhea or loose stools.  10/10/18  Yes Ladell Pier, MD  HYDROcodone-acetaminophen (NORCO/VICODIN) 5-325 MG tablet Take 1 tablet by mouth every 6 (six) hours as needed for moderate pain.   Yes [provider]  hyoscyamine (LEVSIN) 0.125 MG/5ML ELIX Take 0.125 mg by mouth every 4 (four) hours as needed for cramping (bloating).   Yes [provider]  nicotine (NICODERM CQ - DOSED IN MG/24 HOURS) 14 mg/24hr patch Place 1 patch (14 mg total) onto the skin daily. 79/89/21  Yes Leighton Ruff, MD  potassium chloride SA (K-DUR,KLOR-CON) 20 MEQ tablet Take 1 tablet (20 mEq total) by mouth daily. 11/07/18  Yes Owens Shark, NP  prochlorperazine (COMPAZINE) 10 MG tablet Take 1 tablet (10 mg total) by mouth every 6 (six) hours as needed for nausea or vomiting. 10/10/18  Yes Ladell Pier, MD  rOPINIRole (REQUIP) 0.25 MG tablet Take 0.25 mg by mouth at bedtime.   Yes [provider]  mupirocin ointment (BACTROBAN) 2 % Apply to the effected area TID until better Patient not taking: Reported on 11/19/2018 10/17/18   Harle Stanford., PA-C    Physical Exam: Vitals:   11/20/18 2321 11/21/18 0009 11/21/18 0145 11/21/18 0326  BP:   128/78 105/61  Pulse:   99 100  Resp:   17 (!) 26  Temp:  (!) 100.4 F (38 C)  98.4 F (36.9 C)  TempSrc:  Rectal  Oral  SpO2:   95% 100%  Weight: 83.1 kg     Height: 6\' 1"  (1.854 m)         Constitutional: Moderately built and nourished. Vitals:   11/20/18 2321 11/21/18 0009 11/21/18 0145 11/21/18 0326  BP:   128/78 105/61  Pulse:   99 100  Resp:   17 (!) 26  Temp:  (!) 100.4 F (38 C)  98.4 F (36.9 C)  TempSrc:  Rectal  Oral  SpO2:   95% 100%  Weight: 83.1 kg     Height: 6\' 1"  (1.854 m)      Eyes: Anicteric no pallor. ENMT: No discharge from the ears eyes nose and mouth. Neck: No mass felt.  No neck rigidity. Respiratory: No rhonchi or  crepitations. Cardiovascular: S1-S2 heard. Abdomen: Soft nontender bowel sounds present. Musculoskeletal: No edema. Skin: No rash. Neurologic: Alert awake oriented to time place and person.  Moves all extremities. Psychiatric: Appears normal.  Normal affect.   Labs on Admission: I have personally reviewed following labs and imaging studies  CBC: Recent Labs  Lab 11/19/18 2046 11/21/18 0007  WBC 1.2* 3.5*  NEUTROABS 0.7* 2.6  HGB 12.3 10.0*  HCT 37.3 30.5*  MCV 93.7 93.8  PLT  81* 87*   Basic Metabolic Panel: Recent Labs  Lab 11/19/18 2046 11/21/18 0007  NA 141 140  K 2.9* 3.0*  CL 105 107  CO2 26 25  GLUCOSE 107* 103*  BUN 18 14  CREATININE 1.07* 1.11*  CALCIUM 8.9 8.7*   GFR: Estimated Creatinine Clearance: 68.2 mL/min (A) (by C-G formula based on SCr of 1.11 mg/dL (H)). Liver Function Tests: Recent Labs  Lab 11/19/18 2046 11/21/18 0007  AST 36 28  ALT 46* 38  ALKPHOS 110 93  BILITOT 2.3* 1.3*  PROT 7.5 6.8  ALBUMIN 3.8 3.8   Recent Labs  Lab 11/19/18 2046 11/21/18 0007  LIPASE 26 26   No results for input(s): AMMONIA in the last 168 hours. Coagulation Profile: No results for input(s): INR, PROTIME in the last 168 hours. Cardiac Enzymes: No results for input(s): CKTOTAL, CKMB, CKMBINDEX, TROPONINI in the last 168 hours. BNP (last 3 results) No results for input(s): PROBNP in the last 8760 hours. HbA1C: No results for input(s): HGBA1C in the last 72 hours. CBG: No results for input(s): GLUCAP in the last 168 hours. Lipid Profile: No results for input(s): CHOL, HDL, LDLCALC, TRIG, CHOLHDL, LDLDIRECT in the last 72 hours. Thyroid Function Tests: No results for input(s): TSH, T4TOTAL, FREET4, T3FREE, THYROIDAB in the last 72 hours. Anemia Panel: No results for input(s): VITAMINB12, FOLATE, FERRITIN, TIBC, IRON, RETICCTPCT in the last 72 hours. Urine analysis:    Component Value Date/Time   COLORURINE YELLOW 11/21/2018 0007   APPEARANCEUR CLEAR  11/21/2018 0007   LABSPEC 1.017 11/21/2018 0007   PHURINE 5.0 11/21/2018 0007   GLUCOSEU NEGATIVE 11/21/2018 0007   HGBUR SMALL (A) 11/21/2018 0007   BILIRUBINUR NEGATIVE 11/21/2018 0007   KETONESUR NEGATIVE 11/21/2018 0007   PROTEINUR NEGATIVE 11/21/2018 0007   NITRITE NEGATIVE 11/21/2018 0007   LEUKOCYTESUR MODERATE (A) 11/21/2018 0007   Sepsis Labs: @LABRCNTIP (procalcitonin:4,lacticidven:4) ) Recent Results (from the past 240 hour(s))  C difficile quick scan w PCR reflex     Status: None   Collection Time: 11/19/18  8:46 PM  Result Value Ref Range Status   C Diff antigen NEGATIVE NEGATIVE Final   C Diff toxin NEGATIVE NEGATIVE Final   C Diff interpretation No C. difficile detected.  Final    Comment: Performed at [facility], [place] [address] [place], [place] [address]  Gastrointestinal Panel by PCR , Stool     Status: None   Collection Time: 11/19/18  8:46 PM  Result Value Ref Range Status   Campylobacter species NOT DETECTED NOT DETECTED Final   Plesimonas shigelloides NOT DETECTED NOT DETECTED Final   Salmonella species NOT DETECTED NOT DETECTED Final   Yersinia enterocolitica NOT DETECTED NOT DETECTED Final   Vibrio species NOT DETECTED NOT DETECTED Final   Vibrio cholerae NOT DETECTED NOT DETECTED Final   Enteroaggregative E coli (EAEC) NOT DETECTED NOT DETECTED Final   Enteropathogenic E coli (EPEC) NOT DETECTED NOT DETECTED Final   Enterotoxigenic E coli (ETEC) NOT DETECTED NOT DETECTED Final   Shiga like toxin producing E coli (STEC) NOT DETECTED NOT DETECTED Final   Shigella/Enteroinvasive E coli (EIEC) NOT DETECTED NOT DETECTED Final   Cryptosporidium NOT DETECTED NOT DETECTED Final   Cyclospora cayetanensis NOT DETECTED NOT DETECTED Final   Entamoeba histolytica NOT DETECTED NOT DETECTED Final   Giardia lamblia NOT DETECTED NOT DETECTED Final   Adenovirus F40/41 NOT DETECTED NOT DETECTED Final   Astrovirus NOT DETECTED NOT DETECTED Final    Norovirus GI/GII  NOT DETECTED NOT DETECTED Final   Rotavirus A NOT DETECTED NOT DETECTED Final   Sapovirus (I, II, IV, and V) NOT DETECTED NOT DETECTED Final    Comment: Performed at [facility], [address], [place], [place] [address]     Radiological Exams on Admission: Ct Abdomen Pelvis W Contrast  Result Date: 11/21/2018 CLINICAL DATA:  Abdominal pain, diarrhea and nausea with vomiting. EXAM: CT ABDOMEN AND PELVIS WITH CONTRAST TECHNIQUE: Multidetector CT imaging of the abdomen and pelvis was performed using the standard protocol following bolus administration of intravenous contrast. CONTRAST:  161mL ISOVUE-300 IOPAMIDOL (ISOVUE-300) INJECTION 61% COMPARISON:  05/24/2018 FINDINGS: Lower chest: Top normal heart size without pericardial effusion or thickening. Clear lung bases with subsegmental atelectasis and/or scarring on the left. No effusion or pneumothorax. Hepatobiliary: Cholesterol stones noted within the gallbladder without secondary signs of acute cholecystitis. Homogeneous enhancement of the liver without space-occupying mass. No biliary dilatation. Pancreas: No ductal dilatation, mass or inflammation. Spleen: Normal Adrenals/Urinary Tract: Normal bilateral adrenal glands. Marked right-sided hydroureteronephrosis secondary to a 4 x 4 x 4.3 mm ureteral calculus at the pelvic brim, series 4/91 and series 2/59. The urinary bladder is decompressed in appearance. The left renal collecting system and kidney are unremarkable. Stomach/Bowel: Moderate fluid-filled distention of the stomach. Normal small bowel rotation without mechanical bowel obstruction. Moderate stool retention within colon. No CT evidence of acute appendicitis. Staple line across the rectum low anterior resection Vascular/Lymphatic: No significant vascular findings are present. No enlarged abdominal or pelvic lymph nodes. Reproductive: Uterus and bilateral adnexa are unremarkable. Other: No abdominal wall hernia or  abnormality. No abdominopelvic ascites. 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 IMPRESSION: 1. There has been interval migration of previously noted right UPJ stone, now seen at the level of the pelvic brim causing marked right-sided hydroureteronephrosis. 2. Uncomplicated cholelithiasis. 3. 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 Electronically Signed   By: Ashley Royalty M.D.   On: 11/21/2018 02:10    EKG: Independently reviewed.  Sinus tachycardia.  Assessment/Plan Principal Problem:   SIRS (systemic inflammatory response syndrome) (HCC) Active Problems:   Adenocarcinoma of rectum (HCC)   Pancytopenia (HCC)   Right ureteral stone   Acute lower UTI    1. SIRS likely from diarrhea and UTI -continue with hydration patient on empiric antibiotics for UTI given that patient also has right ureteric obstruction.  Closely observe and if there is any signs of severe sepsis patient will need intervention for the right ureteric stone.  Otherwise urologist has advised to strain all urine to make sure the stone is passed.  For diarrhea will check GI pathogen panel.  Diarrhea could also be related to chemo.  Recent C. difficile was negative. 2. Right-sided hydronephrosis with ureteric stone see #1.  Urologist on call was consulted. 3. Diarrhea see #1. 4. Cholelithiasis -asymptomatic at this time. 5. Pancytopenia likely from chemotherapy. 6. Rectal carcinoma status post surgery and on chemo being followed by Dr. Learta Codding.  Please notify oncologist.   DVT prophylaxis: SCDs in anticipation of procedure and also due to thrombocytopenia. Code Status: Full code. Family Communication Patient son at the bedside. Disposition Plan: Home. Consults called: Urology. Admission status: Inpatient.   Rise Patience MD [facility] Pager [phone].  If 7PM-7AM, please contact night-coverage [email or web address] Password [facility]  11/21/2018,  4:06 AM

## 2018-11-21 NOTE — Consult Note (Signed)
Urology Consult Note   Requesting Attending Physician:  [name], [name], DO Service Providing Consult: Urology  Consulting Attending: Nicolette Bang, MD  Reason for Consult: kidney stone  HPI: Norma Colon is seen in consultation for reasons noted above at the request of [name], [name], DO.  This is a 58 y.o. female with history as below including rectal cancer s/p surgery 2019 and currently on chemotherapy who presents abdominal pain and nausea/vomiting, found to have obstructing 22mm right mid ureteral stone. Urology consulted for assistance with management.  Patient was seen in the ED yesterday for similar complaint and was later discharged.   She reports temperatures at home of 99.4 and nausea/vomiting. No dysuria, hematuria, or urgency/frequency to suggest UTI. Pain currently resolved. She does not believe she has passed her stone based on CT scan from 05/2018. She was aware she had a kidney stone, but has never had problems with kidney stones previously.   Past Medical History: Past Medical History:  Diagnosis Date  . Anxiety   . Arthritis   . Cancer ([place])   . Depression   . Headache    migraines   . HPV in female   . Restless leg syndrome   . Scoliosis     Past Surgical History:  Past Surgical History:  Procedure Laterality Date  . COLPOSCOPY  04/04/2018  . left forearm surgery due to calcium rock     . MRI  05/2018   Pelvis- imaging   . SHOULDER ARTHROSCOPY W/ ROTATOR CUFF REPAIR Right   . XI ROBOTIC ASSISTED LOWER ANTERIOR RESECTION N/A 09/16/2018   Procedure: XI ROBOTIC ASSISTED LOWER ANTERIOR RESECTION ERAS PATHWAY;  Surgeon: Leighton Ruff, MD;  Location: [facility];  Service: General;  Laterality: N/A;    Medication: Current Facility-Administered Medications  Medication Dose Route Frequency Provider Last Rate Last Dose  . iopamidol (ISOVUE-300) 61 % injection           . metroNIDAZOLE (FLAGYL) IVPB 500 mg  500 mg Intravenous Q8H [name], Kristen N, DO    Stopped at 11/21/18 3329  . sodium chloride (PF) 0.9 % injection           . vancomycin (VANCOCIN) 1,750 mg in sodium chloride 0.9 % 500 mL IVPB  1,750 mg Intravenous Once [name], Kristen N, DO 250 mL/hr at 11/21/18 0233 1,750 mg at 11/21/18 5188   Current Outpatient Medications  Medication Sig Dispense Refill  . alprazolam (XANAX) 2 MG tablet Take 2 mg by mouth daily.    . Ascorbic Acid (VITAMIN C) 1000 MG tablet Take 1,000 mg by mouth daily.    [name] aspirin 81 MG tablet Take 81 mg by mouth daily.    [name] buPROPion (WELLBUTRIN XL) 300 MG 24 hr tablet Take 300 mg by mouth daily.    . capecitabine (XELODA) 500 MG tablet Take 4 tablets (2000mg ) in AM & 3 tabs (1500mg ) in PM, immediately after food, for 14 days on, 7 days off, repeat every 21d 98 tablet 3  . cyclobenzaprine (FLEXERIL) 10 MG tablet Take 20 mg by mouth at bedtime.     . dicyclomine (BENTYL) 20 MG tablet Take 1 tablet (20 mg total) by mouth 2 (two) times daily. 20 tablet 0  . diphenoxylate-atropine (LOMOTIL) 2.5-0.025 MG tablet 1 to 2 PO QID prn diarrhea 40 tablet 1  . HYDROcodone-acetaminophen (NORCO/VICODIN) 5-325 MG tablet Take 1 tablet by mouth every 6 (six) hours as needed for moderate pain.    . mupirocin ointment (BACTROBAN) 2 %  Apply to the effected area TID until better (Patient not taking: Reported on 11/19/2018) 30 g 0  . nicotine (NICODERM CQ - DOSED IN MG/24 HOURS) 14 mg/24hr patch Place 1 patch (14 mg total) onto the skin daily. 28 patch 0  . potassium chloride SA (K-DUR,KLOR-CON) 20 MEQ tablet Take 1 tablet (20 mEq total) by mouth daily. 14 tablet 1  . Probiotic Product (PROBIOTIC PO) Take 1 capsule by mouth daily.    . prochlorperazine (COMPAZINE) 10 MG tablet Take 1 tablet (10 mg total) by mouth every 6 (six) hours as needed for nausea or vomiting. 60 tablet 1  . rOPINIRole (REQUIP) 0.25 MG tablet Take 0.25 mg by mouth at bedtime.      Allergies: Allergies  Allergen Reactions  . Codeine Other (See Comments)    Causes  migraine--patient requests this be kept on allergy list    Social History: Social History   Tobacco Use  . Smoking status: Current Every Day Smoker    Packs/day: 1.00    Years: 30.00    Pack years: 30.00    Types: Cigarettes  . Smokeless tobacco: Never Used  . Tobacco comment: tobacco info given 05/04/18  Substance Use Topics  . Alcohol use: Yes    Comment: social  . Drug use: Never    Family History Family History  Problem Relation Age of Onset  . COPD Father        smoker  . Stomach cancer Father        spread to lungs and bones  . Arthritis Mother   . Heart attack Paternal Grandmother   . Heart attack Paternal Grandfather   . Heart attack Maternal Grandmother   . Stroke Maternal Grandmother   . Colon cancer Neg Hx     Review of Systems 10 systems were reviewed and are negative except as noted specifically in the HPI.  Objective   Vital signs in last 24 hours: BP 128/78   Pulse 99   Temp (!) 100.4 F (38 C) (Rectal)   Resp 17   Ht 6\' 1"  (1.854 m)   Wt 83.1 kg   LMP  (LMP Unknown)   SpO2 95%   BMI 24.17 kg/m   Physical Exam General: NAD, A&O, resting, appropriate HEENT: [place]/AT, EOMI, MMM Pulmonary: Normal work of breathing Cardiovascular: HDS, adequate peripheral perfusion Abdomen: Soft, mild lower abdominal tenderness, nondistended GU: No CVA tenderness Extremities: warm and well perfused Neuro: Appropriate, no focal neurological deficits  Most Recent Labs: Lab Results  Component Value Date   WBC 3.5 (L) 11/21/2018   HGB 10.0 (L) 11/21/2018   HCT 30.5 (L) 11/21/2018   PLT 87 (L) 11/21/2018    Lab Results  Component Value Date   NA 140 11/21/2018   K 3.0 (L) 11/21/2018   CL 107 11/21/2018   CO2 25 11/21/2018   BUN 14 11/21/2018   CREATININE 1.11 (H) 11/21/2018   CALCIUM 8.7 (L) 11/21/2018   MG 1.8 07/18/2018    No results found for: INR, APTT   IMAGING: Ct Abdomen Pelvis W Contrast  Result Date: 11/21/2018 CLINICAL DATA:   Abdominal pain, diarrhea and nausea with vomiting. EXAM: CT ABDOMEN AND PELVIS WITH CONTRAST TECHNIQUE: Multidetector CT imaging of the abdomen and pelvis was performed using the standard protocol following bolus administration of intravenous contrast. CONTRAST:  138mL ISOVUE-300 IOPAMIDOL (ISOVUE-300) INJECTION 61% COMPARISON:  05/24/2018 FINDINGS: Lower chest: Top normal heart size without pericardial effusion or thickening. Clear lung bases with subsegmental atelectasis and/or  scarring on the left. No effusion or pneumothorax. Hepatobiliary: Cholesterol stones noted within the gallbladder without secondary signs of acute cholecystitis. Homogeneous enhancement of the liver without space-occupying mass. No biliary dilatation. Pancreas: No ductal dilatation, mass or inflammation. Spleen: Normal Adrenals/Urinary Tract: Normal bilateral adrenal glands. Marked right-sided hydroureteronephrosis secondary to a 4 x 4 x 4.3 mm ureteral calculus at the pelvic brim, series 4/91 and series 2/59. The urinary bladder is decompressed in appearance. The left renal collecting system and kidney are unremarkable. Stomach/Bowel: Moderate fluid-filled distention of the stomach. Normal small bowel rotation without mechanical bowel obstruction. Moderate stool retention within colon. No CT evidence of acute appendicitis. Staple line across the rectum low anterior resection Vascular/Lymphatic: No significant vascular findings are present. No enlarged abdominal or pelvic lymph nodes. Reproductive: Uterus and bilateral adnexa are unremarkable. Other: No abdominal wall hernia or abnormality. No abdominopelvic ascites. 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 IMPRESSION: 1. There has been interval migration of previously noted right UPJ stone, now seen at the level of the pelvic brim causing marked right-sided hydroureteronephrosis.  2. Uncomplicated cholelithiasis. 3. 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 Electronically Signed   By: Ashley Royalty M.D.   On: 11/21/2018 02:10    ------  Assessment:  58 y.o. female with history of rectal cancer s/p recent resection and on current chemotherapy presenting with abdominal pain found to have 17mm obstructing mid ureteral stone.  Patient with Tmax of 100.4 and mild tachycardia. Current temp 98.4. Normal BPs. Non toxic appearing. WBC 3.5 (yesterday 1.2). UA with negative nitrites, moderate LE, 21-50 WBCs, rare bacteria. She received vanc/cefepime/flagyl. No bothersome urinary symptoms to suggest UTI. Cr is 1.11. No pain at this time.  Discussed options at this time include continued observation with trial of passage versus proceeding with placement of right ureteral stent. Risks and benefits of each were discussed in detail. She prefers for trial of passage at this time which is reasonable given that she is overall clinically well-appearing.   Recommendations: - Agree with admission to medicine team for pain control and antibiotics - Follow up urine culture results - Recommend aggressive IVFs and Flomax. Please strain all urine. - Closely monitor vital signs and contact urology if patient were to show signs/symptoms of systemic infection as this may prompt emergent intervention. - Please keep NPO for now. Urology will reevaluate later this morning to determine whether or not to proceed with placement of right ureteral stent, please keep NPO.   Thank you for this consult. Please contact the urology consult pager with any further questions/concerns.

## 2018-11-22 ENCOUNTER — Encounter (HOSPITAL_COMMUNITY): Payer: Self-pay | Admitting: Urology

## 2018-11-22 DIAGNOSIS — N390 Urinary tract infection, site not specified: Secondary | ICD-10-CM

## 2018-11-22 DIAGNOSIS — D61818 Other pancytopenia: Secondary | ICD-10-CM

## 2018-11-22 DIAGNOSIS — C20 Malignant neoplasm of rectum: Secondary | ICD-10-CM

## 2018-11-22 DIAGNOSIS — N200 Calculus of kidney: Secondary | ICD-10-CM

## 2018-11-22 DIAGNOSIS — A419 Sepsis, unspecified organism: Principal | ICD-10-CM

## 2018-11-22 DIAGNOSIS — G4700 Insomnia, unspecified: Secondary | ICD-10-CM

## 2018-11-22 LAB — URINE CULTURE: Culture: NO GROWTH

## 2018-11-22 LAB — BASIC METABOLIC PANEL
Anion gap: 6 (ref 5–15)
BUN: 13 mg/dL (ref 6–20)
CO2: 23 mmol/L (ref 22–32)
Calcium: 8.5 mg/dL — ABNORMAL LOW (ref 8.9–10.3)
Chloride: 112 mmol/L — ABNORMAL HIGH (ref 98–111)
Creatinine, Ser: 0.88 mg/dL (ref 0.44–1.00)
GFR calc Af Amer: >60 mL/min (ref >60)
GFR calc non Af Amer: >60 mL/min (ref >60)
Glucose, Bld: 130 mg/dL — ABNORMAL HIGH (ref 70–99)
Potassium: 3.6 mmol/L (ref 3.5–5.1)
Sodium: 141 mmol/L (ref 135–145)

## 2018-11-22 MED ORDER — SODIUM CHLORIDE 0.9 % IV SOLN
INTRAVENOUS | Status: DC | PRN
  Administered 2018-11-22: 1000 mL via INTRAVENOUS

## 2018-11-22 MED ORDER — ZOLPIDEM TARTRATE 5 MG PO TABS: 5.0000 mg | ORAL_TABLET | Freq: Every evening | ORAL | Status: DC | PRN

## 2018-11-22 MED ORDER — HYDROCODONE-ACETAMINOPHEN 5-325 MG PO TABS
1.0000 | ORAL_TABLET | Freq: Four times a day (QID) | ORAL | Status: DC | PRN
  Administered 2018-11-23 (×2): 1 via ORAL
  Filled 2018-11-22 (×2): qty 1

## 2018-11-22 NOTE — Progress Notes (Signed)
Urology Progress Note   1 Day Post-Op  Subjective/Interval: NAEON. AFVSS. UOP adequate. No pain.  Objective: Vital signs in last 24 hours: Temp:  [97.5 F (36.4 C)-98.9 F (37.2 C)] 97.5 F (36.4 C) (02/18 0555) Pulse Rate:  [71-90] 90 (02/18 0555) Resp:  [14-22] 17 (02/18 0555) BP: (98-119)/(56-80) 119/61 (02/18 0555) SpO2:  [97 %-100 %] 98 % (02/18 0555)  Intake/Output from previous day: 02/17 0701 - 02/18 0700 In: 3122 [P.O.:804; I.V.:1759.4; IV Piggyback:558.6] Out: 2400 [Urine:2400] Intake/Output this shift: No intake/output data recorded.  Physical Exam:  General: Alert and oriented CV: RRR Lungs: Clear Abdomen: Soft, non-tender GU: Foley in place draining clear yellow urine. No CVAT Ext: NT, No erythema  Lab Results: Recent Labs    11/19/18 2046 11/21/18 0007 11/21/18 0559  HGB 12.3 10.0* 8.7*  HCT 37.3 30.5* 26.9*   BMET Recent Labs    11/21/18 0007 11/21/18 0559  NA 140 138  K 3.0* 3.1*  CL 107 110  CO2 25 20*  GLUCOSE 103* 91  BUN 14 10  CREATININE 1.11* 0.96  CALCIUM 8.7* 7.6*     Studies/Results: Ct Abdomen Pelvis W Contrast  Result Date: 11/21/2018 CLINICAL DATA:  Abdominal pain, diarrhea and nausea with vomiting. EXAM: CT ABDOMEN AND PELVIS WITH CONTRAST TECHNIQUE: Multidetector CT imaging of the abdomen and pelvis was performed using the standard protocol following bolus administration of intravenous contrast. CONTRAST:  172mL ISOVUE-300 IOPAMIDOL (ISOVUE-300) INJECTION 61% COMPARISON:  05/24/2018 FINDINGS: Lower chest: Top normal heart size without pericardial effusion or thickening. Clear lung bases with subsegmental atelectasis and/or scarring on the left. No effusion or pneumothorax. Hepatobiliary: Cholesterol stones noted within the gallbladder without secondary signs of acute cholecystitis. Homogeneous enhancement of the liver without space-occupying mass. No biliary dilatation. Pancreas: No ductal dilatation, mass or inflammation.  Spleen: Normal Adrenals/Urinary Tract: Normal bilateral adrenal glands. Marked right-sided hydroureteronephrosis secondary to a 4 x 4 x 4.3 mm ureteral calculus at the pelvic brim, series 4/91 and series 2/59. The urinary bladder is decompressed in appearance. The left renal collecting system and kidney are unremarkable. Stomach/Bowel: Moderate fluid-filled distention of the stomach. Normal small bowel rotation without mechanical bowel obstruction. Moderate stool retention within colon. No CT evidence of acute appendicitis. Staple line across the rectum low anterior resection Vascular/Lymphatic: No significant vascular findings are present. No enlarged abdominal or pelvic lymph nodes. Reproductive: Uterus and bilateral adnexa are unremarkable. Other: No abdominal wall hernia or abnormality. No abdominopelvic ascites. 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 IMPRESSION: 1. There has been interval migration of previously noted right UPJ stone, now seen at the level of the pelvic brim causing marked right-sided hydroureteronephrosis. 2. Uncomplicated cholelithiasis. 3. 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 Electronically Signed   By: Ashley Royalty M.D.   On: 11/21/2018 02:10   Dg C-arm 1-60 Min-no Report  Result Date: 11/21/2018 Fluoroscopy was utilized by the requesting physician.  No radiographic interpretation.    Assessment/Plan:  58 y.o. female s/p right ureteral stent placement on 2/18 for 89mm obstructing right ureteral stone.  Overall doing well post-op.   - OK to discontinue Foley catheter. - Continue antibiotics, follow up urine culture results. - Patient will be scheduled for right ureteroscopic stone extraction in ~2 weeks. - Please page urology with additional questions or concerns.    LOS:  1 day   Osa Fogarty Rob Bunting 11/22/2018, 7:34 AM

## 2018-11-22 NOTE — Progress Notes (Addendum)
IP PROGRESS NOTE  Subjective:   Norma Colon has a history of rectal cancer.  She is currently completing adjuvant chemotherapy.  She began cycle 2 adjuvant CAPOX 11/07/2018.  She last took Xeloda 11/19/2018. She presented to emergency room 11/19/2018 with diarrhea.  Reports no diarrhea or vomiting since admission. Still with cramping in her stomach. Trying to eat more. Had right ureteral stent placed 11/21/2018. Foley to be discontinued today. Afebrile for the past 24 hours. Remains on IV metronidazole and cefepime.   Objective: Vital signs in last 24 hours: Blood pressure 119/61, pulse 90, temperature (!) 97.5 F (36.4 C), temperature source Oral, resp. rate 17, height 6' 1"  (1.854 m), weight 186 lb 11.2 oz (84.7 kg), SpO2 98 %.  Intake/Output from previous day: 02/17 0701 - 02/18 0700 In: 3122 [P.O.:804; I.V.:1759.4; IV Piggyback:558.6] Out: 2400 [Urine:2400]  Physical Exam:  HEENT: No thrush or ulcers Lungs: End inspiratory rales at the left posterior base, no respiratory distress Cardiac: Regular rate and rhythm Abdomen: Soft, no hepatosplenomegaly, mild diffuse tenderness Extremities: No leg edema Skin: Palms and soles without erythema  Lab Results: Recent Labs    11/21/18 0007 11/21/18 0559  WBC 3.5* 3.1*  HGB 10.0* 8.7*  HCT 30.5* 26.9*  PLT 87* 67*    BMET Recent Labs    11/21/18 0007 11/21/18 0559  NA 140 138  K 3.0* 3.1*  CL 107 110  CO2 25 20*  GLUCOSE 103* 91  BUN 14 10  CREATININE 1.11* 0.96  CALCIUM 8.7* 7.6*    No results found for: CEA1  Studies/Results: Ct Abdomen Pelvis W Contrast  Result Date: 11/21/2018 CLINICAL DATA:  Abdominal pain, diarrhea and nausea with vomiting. EXAM: CT ABDOMEN AND PELVIS WITH CONTRAST TECHNIQUE: Multidetector CT imaging of the abdomen and pelvis was performed using the standard protocol following bolus administration of intravenous contrast. CONTRAST:  111m ISOVUE-300 IOPAMIDOL (ISOVUE-300) INJECTION 61% COMPARISON:   05/24/2018 FINDINGS: Lower chest: Top normal heart size without pericardial effusion or thickening. Clear lung bases with subsegmental atelectasis and/or scarring on the left. No effusion or pneumothorax. Hepatobiliary: Cholesterol stones noted within the gallbladder without secondary signs of acute cholecystitis. Homogeneous enhancement of the liver without space-occupying mass. No biliary dilatation. Pancreas: No ductal dilatation, mass or inflammation. Spleen: Normal Adrenals/Urinary Tract: Normal bilateral adrenal glands. Marked right-sided hydroureteronephrosis secondary to a 4 x 4 x 4.3 mm ureteral calculus at the pelvic brim, series 4/91 and series 2/59. The urinary bladder is decompressed in appearance. The left renal collecting system and kidney are unremarkable. Stomach/Bowel: Moderate fluid-filled distention of the stomach. Normal small bowel rotation without mechanical bowel obstruction. Moderate stool retention within colon. No CT evidence of acute appendicitis. Staple line across the rectum low anterior resection Vascular/Lymphatic: No significant vascular findings are present. No enlarged abdominal or pelvic lymph nodes. Reproductive: Uterus and bilateral adnexa are unremarkable. Other: No abdominal wall hernia or abnormality. No abdominopelvic ascites. 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 IMPRESSION: 1. There has been interval migration of previously noted right UPJ stone, now seen at the level of the pelvic brim causing marked right-sided hydroureteronephrosis. 2. Uncomplicated cholelithiasis. 3. 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 Electronically Signed   By: DAshley RoyaltyM.D.   On: 11/21/2018 02:10   Dg C-arm 1-60 Min-no Report  Result Date: 11/21/2018 Fluoroscopy was  utilized by the  requesting physician.  No radiographic interpretation.    Medications: I have reviewed the patient's current medications.  Assessment/Plan:  1. Rectal cancer ? 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 CTs 05/24/2018-mass beginning at proximal he 7.6 cm from the anal verge, "20 "perirectal lymph nodes, no evidence of metastatic disease ? Pelvic MRI 06/01/2018-tumor measured at 10.8 cm from the anal sphincter, T3b, N1-2 left perirectal lymph nodes, each measuring 5 mm ? Radiation and Xeloda initiated 06/13/2018, completed 07/20/2018 ? Low anterior resection 09/16/2018,ypT3,ypN1c, 1 cm tumor, partial response-score 2, 0/13 lymph nodes positive, 2 tumor deposits, MSI-stable, no loss of mismatch repair protein expression ? Cycle 1 CAPOX 10/17/2018 ? Cycle 2 CAPOX 11/07/2018 (oxaliplatin dose reduced due to neutropenia), Xeloda discontinued 11/19/2018 secondary to diarrhea 2. Tobacco use 3. Right UPJ stone on CT 05/24/2018  CT 11/21/2018-migration of the right UPJ stone, now at the level of the pelvic brim with marked right hydroureteronephrosis  Placement of right ureteral stent on 11/21/2018. 4. Migraine headaches 5. Restless legs 6. Arm pain during the oxaliplatin infusion and for 1 week after cycle 1.  The oxaliplatin will be further diluted and infusion time extended getting with cycle 2. 7. Neutropenia secondary to chemotherapy.  Oxaliplatin dose reduced beginning with cycle 2. 8. Fever 11/21/2018-likely secondary to a urinary tract infection 9. Diarrhea secondary to capecitabine  Ms. Milian is now at Day 16 following cycle 2 CAPOX given as adjuvant treatment for stage III rectal cancer.  She is admitted with diarrhea, abdominal pain, nausea, and a fever.  The diarrhea is likely secondary to capecitabine induced enteritis.  The fever may be secondary to enteritis versus the  right hydronephrosis/urinary infection. No diarrhea or vomiting since admission. Afebrile for the past 24 hours.   Recommendations: 1.  Continue Imodium/Lomotil for diarrhea as needed. 2.  Intravenous hydration, replete potassium per the medical service. 3.  Management of the right kidney stone per urology. S/P right ureteral stent on 11/21/2018. Will have stone extraction as outpatient.  4.  Antibiotics, follow-up cultures. 5.  Currently on off week of Xeloda. Patient has indicated that she does not want additional chemo. Will discuss at follow up appointment.  6.  Follow-up as scheduled at the Cancer center 11/28/2018 7.  Please call Oncology as needed     LOS: 1 day   Mikey Bussing, NP   11/22/2018, 7:46 AM  Addendum  I have seen the patient, examined her. I agree with the assessment and and plan and have edited the notes. I am covering Dr. Benay Spice to see her today.  She has not have any BM since hospital admission, still fatigued, complains of low abdominal pain and insomnia. I will resume her Norco as needed for her pain, and order ambien PRN for her insomnia. Continue antibiotics per primary team and urology. Her blood counts are still low, likely related to chemo and hydration. AKI resolved. I anticipate she will be discharged home in a few days. Pt is not interested more chemo, this will be discussed with her primary oncologist Dr. Benay Spice on f/u visit. We will continue f/u when she is in house. Appreciate the excellent care from the hospitalist and urology team.    Truitt Merle  11/22/2018

## 2018-11-22 NOTE — Progress Notes (Signed)
PROGRESS NOTE    Norma Colon   MVE:[id number]  DOB: 1960-11-08  DOA: 11/20/2018 PCP: Dian Queen, MD   Brief Narrative:  Norma Colon  is a 58 y.o. female with history of rectal carcinoma recent surgery in December 2019 presently on chemotherapy who presents with nausea, vomiting, diarrhea and abdominal pain. She stopped her Xeloda as she suspects that it was the cause of her symptoms.  In the ED, she is found to have a right sided nephrolithiasis with hydronephrosis and a UA concerning for a UTI.   Subjective: Still feels cramping and pain in her lower abdomen. She has not had any further vomiting or diarrhea since being in the hospital.     Assessment & Plan:   Principal Problem:   Sepsis  -  Right ureteral stone causing hydroneprosis   Acute lower UTI - s/p stenting yesterday- continue to follow cultures and continue Cefepime -cont flomax  Active Problems: Hypokalemia - treated and resolved- likely was secondary to diarrhea   vomiting and diarrhea-  Adenocarcinoma of rectum   - Xeloda has been discontinued by the patient as her GI symptoms started when the dose was recently increased - stool pathogen panel has been negative x 2 - symptoms have resolved -  further management per oncology - f/u on planned on 11/28/18    Pancytopenia - likely due to chemo- recheck tomorrow  Nicotine abuse - cont Nicoderm patch  Depression/ anxiety - Wellbutrin, Xanax  Time spent in minutes: 35 DVT prophylaxis: SCDs Code Status: Full code Family Communication:  Disposition Plan: home tomorrow Consultants:   Urology  oncology Procedures:   Ureteral stenting Antimicrobials:  Anti-infectives (From admission, onward)   Start     Dose/Rate Route Frequency Ordered Stop   11/21/18 0600  ceFEPIme (MAXIPIME) 2 g in sodium chloride 0.9 % 100 mL IVPB     2 g 200 mL/hr over 30 Minutes Intravenous Every 8 hours 11/21/18 0520     11/21/18 0030  ceFEPIme (MAXIPIME) 2 g in sodium  chloride 0.9 % 100 mL IVPB     2 g 200 mL/hr over 30 Minutes Intravenous  Once 11/21/18 0015 11/21/18 0111   11/21/18 0030  metroNIDAZOLE (FLAGYL) IVPB 500 mg  Status:  Discontinued     500 mg 100 mL/hr over 60 Minutes Intravenous Every 8 hours 11/21/18 0015 11/22/18 1020   11/21/18 0030  vancomycin (VANCOCIN) IVPB 1000 mg/200 mL premix  Status:  Discontinued     1,000 mg 200 mL/hr over 60 Minutes Intravenous  Once 11/21/18 0015 11/21/18 0023   11/21/18 0030  vancomycin (VANCOCIN) 1,750 mg in sodium chloride 0.9 % 500 mL IVPB     1,750 mg 250 mL/hr over 120 Minutes Intravenous  Once 11/21/18 0023 11/21/18 0454       Objective: Vitals:   11/21/18 1415 11/21/18 1439 11/21/18 2131 11/22/18 0555  BP: 106/80 106/69 118/72 119/61  Pulse: 75 71 82 90  Resp: (!) 22 20  17   Temp: 97.7 F (36.5 C) 97.7 F (36.5 C) 98.9 F (37.2 C) (!) 97.5 F (36.4 C)  TempSrc:  Oral Oral Oral  SpO2: 100% 100% 97% 98%  Weight:      Height:        Intake/Output Summary (Last 24 hours) at 11/22/2018 1121 Last data filed at 11/22/2018 0850 Gross per 24 hour  Intake 3122.02 ml  Output 2750 ml  Net 372.02 ml   Filed Weights   11/20/18 2321 11/21/18 0516  Weight: 83.1 kg  84.7 kg    Examination: General exam: Appears comfortable  HEENT: PERRLA, oral mucosa moist, no sclera icterus or thrush Respiratory system: Clear to auscultation. Respiratory effort normal. Cardiovascular system: S1 & S2 heard, RRR.   Gastrointestinal system: Abdomen soft, tender across lower abdomen, nondistended. Normal bowel sounds. Central nervous system: Alert and oriented. No focal neurological deficits. Extremities: No cyanosis, clubbing or edema Skin: No rashes or ulcers Psychiatry:  Mood & affect appropriate.     Data Reviewed: I have personally reviewed following labs and imaging studies  CBC: Recent Labs  Lab 11/19/18 2046 11/21/18 0007 11/21/18 0559  WBC 1.2* 3.5* 3.1*  NEUTROABS 0.7* 2.6 2.2  HGB 12.3  10.0* 8.7*  HCT 37.3 30.5* 26.9*  MCV 93.7 93.8 98.5  PLT 81* 87* 67*   Basic Metabolic Panel: Recent Labs  Lab 11/19/18 2046 11/21/18 0007 11/21/18 0559 11/22/18 0821  NA 141 140 138 141  K 2.9* 3.0* 3.1* 3.6  CL 105 107 110 112*  CO2 26 25 20* 23  GLUCOSE 107* 103* 91 130*  BUN 18 14 10 13   CREATININE 1.07* 1.11* 0.96 0.88  CALCIUM 8.9 8.7* 7.6* 8.5*   GFR: Estimated Creatinine Clearance: 86 mL/min (by C-G formula based on SCr of 0.88 mg/dL). Liver Function Tests: Recent Labs  Lab 11/19/18 2046 11/21/18 0007 11/21/18 0559  AST 36 28 25  ALT 46* 38 32  ALKPHOS 110 93 82  BILITOT 2.3* 1.3* 1.1  PROT 7.5 6.8 5.9*  ALBUMIN 3.8 3.8 3.1*   Recent Labs  Lab 11/19/18 2046 11/21/18 0007  LIPASE 26 26   No results for input(s): AMMONIA in the last 168 hours. Coagulation Profile: No results for input(s): INR, PROTIME in the last 168 hours. Cardiac Enzymes: No results for input(s): CKTOTAL, CKMB, CKMBINDEX, TROPONINI in the last 168 hours. BNP (last 3 results) No results for input(s): PROBNP in the last 8760 hours. HbA1C: No results for input(s): HGBA1C in the last 72 hours. CBG: No results for input(s): GLUCAP in the last 168 hours. Lipid Profile: No results for input(s): CHOL, HDL, LDLCALC, TRIG, CHOLHDL, LDLDIRECT in the last 72 hours. Thyroid Function Tests: No results for input(s): TSH, T4TOTAL, FREET4, T3FREE, THYROIDAB in the last 72 hours. Anemia Panel: No results for input(s): VITAMINB12, FOLATE, FERRITIN, TIBC, IRON, RETICCTPCT in the last 72 hours. Urine analysis:    Component Value Date/Time   COLORURINE YELLOW 11/21/2018 0007   APPEARANCEUR CLEAR 11/21/2018 0007   LABSPEC 1.017 11/21/2018 0007   PHURINE 5.0 11/21/2018 0007   GLUCOSEU NEGATIVE 11/21/2018 0007   HGBUR SMALL (A) 11/21/2018 0007   BILIRUBINUR NEGATIVE 11/21/2018 0007   KETONESUR NEGATIVE 11/21/2018 0007   PROTEINUR NEGATIVE 11/21/2018 0007   NITRITE NEGATIVE 11/21/2018 0007    LEUKOCYTESUR MODERATE (A) 11/21/2018 0007   Sepsis Labs: @LABRCNTIP (procalcitonin:4,lacticidven:4) ) Recent Results (from the past 240 hour(s))  C difficile quick scan w PCR reflex     Status: None   Collection Time: 11/19/18  8:46 PM  Result Value Ref Range Status   C Diff antigen NEGATIVE NEGATIVE Final   C Diff toxin NEGATIVE NEGATIVE Final   C Diff interpretation No C. difficile detected.  Final    Comment: Performed at [facility], [place] [address], [place], [place] [address]  Gastrointestinal Panel by PCR , Stool     Status: None   Collection Time: 11/19/18  8:46 PM  Result Value Ref Range Status   Campylobacter species NOT DETECTED NOT DETECTED Final  Plesimonas shigelloides NOT DETECTED NOT DETECTED Final   Salmonella species NOT DETECTED NOT DETECTED Final   Yersinia enterocolitica NOT DETECTED NOT DETECTED Final   Vibrio species NOT DETECTED NOT DETECTED Final   Vibrio cholerae NOT DETECTED NOT DETECTED Final   Enteroaggregative E coli (EAEC) NOT DETECTED NOT DETECTED Final   Enteropathogenic E coli (EPEC) NOT DETECTED NOT DETECTED Final   Enterotoxigenic E coli (ETEC) NOT DETECTED NOT DETECTED Final   Shiga like toxin producing E coli (STEC) NOT DETECTED NOT DETECTED Final   Shigella/Enteroinvasive E coli (EIEC) NOT DETECTED NOT DETECTED Final   Cryptosporidium NOT DETECTED NOT DETECTED Final   Cyclospora cayetanensis NOT DETECTED NOT DETECTED Final   Entamoeba histolytica NOT DETECTED NOT DETECTED Final   Giardia lamblia NOT DETECTED NOT DETECTED Final   Adenovirus F40/41 NOT DETECTED NOT DETECTED Final   Astrovirus NOT DETECTED NOT DETECTED Final   Norovirus GI/GII NOT DETECTED NOT DETECTED Final   Rotavirus A NOT DETECTED NOT DETECTED Final   Sapovirus (I, II, IV, and V) NOT DETECTED NOT DETECTED Final    Comment: Performed at [facility], [address] [place], [place] [address]  Urine culture     Status: None   Collection Time:  11/21/18 12:07 AM  Result Value Ref Range Status   Specimen Description   Final    URINE, RANDOM Performed at [facility], [place] [address] [place], [place] [address]    Special Requests   Final    NONE Performed at [facility], [place] [address], [place], [place] [address]    Culture   Final    NO GROWTH Performed at [facility], [place] [address] [place] [address], [place], [place] [address]    Report Status 11/22/2018 FINAL  Final  Blood culture (routine x 2)     Status: None (Preliminary result)   Collection Time: 11/21/18 12:15 AM  Result Value Ref Range Status   Specimen Description   Final    BLOOD LEFT ANTECUBITAL Performed at [place] [address] [place], [place] [address]    Special Requests   Final    BOTTLES DRAWN AEROBIC AND ANAEROBIC Blood Culture adequate volume Performed at [place] [address] [place], [place] [address]    Culture   Final    NO GROWTH 1 DAY Performed at [facility], [place] [address] [place], [place] [address]    Report Status PENDING  Incomplete  Blood culture (routine x 2)     Status: None (Preliminary result)   Collection Time: 11/21/18 12:20 AM  Result Value Ref Range Status   Specimen Description   Final    BLOOD RIGHT ANTECUBITAL Performed at [place] [address], [place], [place] [address]    Special Requests   Final    BOTTLES DRAWN AEROBIC AND ANAEROBIC Blood Culture adequate volume Performed at [facility] [address], [place], [place] [address]    Culture   Final    NO GROWTH 1 DAY Performed at [facility], [place] [address], [place], [place] [address]    Report Status PENDING  Incomplete  Gastrointestinal Panel by PCR , Stool     Status: None   Collection Time: 11/21/18 12:00 PM  Result Value Ref Range Status   Campylobacter species NOT DETECTED NOT DETECTED Final   Plesimonas  shigelloides NOT DETECTED NOT DETECTED Final   Salmonella species NOT DETECTED  NOT DETECTED Final   Yersinia enterocolitica NOT DETECTED NOT DETECTED Final   Vibrio species NOT DETECTED NOT DETECTED Final   Vibrio cholerae NOT DETECTED NOT DETECTED Final   Enteroaggregative E coli (EAEC) NOT DETECTED NOT DETECTED Final   Enteropathogenic E coli (EPEC) NOT DETECTED NOT DETECTED Final   Enterotoxigenic E coli (ETEC) NOT DETECTED NOT DETECTED Final   Shiga like toxin producing E coli (STEC) NOT DETECTED NOT DETECTED Final   Shigella/Enteroinvasive E coli (EIEC) NOT DETECTED NOT DETECTED Final   Cryptosporidium NOT DETECTED NOT DETECTED Final   Cyclospora cayetanensis NOT DETECTED NOT DETECTED Final   Entamoeba histolytica NOT DETECTED NOT DETECTED Final   Giardia lamblia NOT DETECTED NOT DETECTED Final   Adenovirus F40/41 NOT DETECTED NOT DETECTED Final   Astrovirus NOT DETECTED NOT DETECTED Final   Norovirus GI/GII NOT DETECTED NOT DETECTED Final   Rotavirus A NOT DETECTED NOT DETECTED Final   Sapovirus (I, II, IV, and V) NOT DETECTED NOT DETECTED Final    Comment: Performed at [facility], [address], [place], [place] [address]         Radiology Studies: Ct Abdomen Pelvis W Contrast  Result Date: 11/21/2018 CLINICAL DATA:  Abdominal pain, diarrhea and nausea with vomiting. EXAM: CT ABDOMEN AND PELVIS WITH CONTRAST TECHNIQUE: Multidetector CT imaging of the abdomen and pelvis was performed using the standard protocol following bolus administration of intravenous contrast. CONTRAST:  14mL ISOVUE-300 IOPAMIDOL (ISOVUE-300) INJECTION 61% COMPARISON:  05/24/2018 FINDINGS: Lower chest: Top normal heart size without pericardial effusion or thickening. Clear lung bases with subsegmental atelectasis and/or scarring on the left. No effusion or pneumothorax. Hepatobiliary: Cholesterol stones noted within the gallbladder without secondary signs of acute cholecystitis. Homogeneous  enhancement of the liver without space-occupying mass. No biliary dilatation. Pancreas: No ductal dilatation, mass or inflammation. Spleen: Normal Adrenals/Urinary Tract: Normal bilateral adrenal glands. Marked right-sided hydroureteronephrosis secondary to a 4 x 4 x 4.3 mm ureteral calculus at the pelvic brim, series 4/91 and series 2/59. The urinary bladder is decompressed in appearance. The left renal collecting system and kidney are unremarkable. Stomach/Bowel: Moderate fluid-filled distention of the stomach. Normal small bowel rotation without mechanical bowel obstruction. Moderate stool retention within colon. No CT evidence of acute appendicitis. Staple line across the rectum low anterior resection Vascular/Lymphatic: No significant vascular findings are present. No enlarged abdominal or pelvic lymph nodes. Reproductive: Uterus and bilateral adnexa are unremarkable. Other: No abdominal wall hernia or abnormality. No abdominopelvic ascites. 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 IMPRESSION: 1. There has been interval migration of previously noted right UPJ stone, now seen at the level of the pelvic brim causing marked right-sided hydroureteronephrosis. 2. Uncomplicated cholelithiasis. 3. 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 Electronically Signed   By: Ashley Royalty M.D.   On: 11/21/2018 02:10   Dg C-arm 1-60 Min-no Report  Result Date: 11/21/2018 Fluoroscopy was utilized by the requesting physician.  No radiographic interpretation.      Scheduled Meds: . ALPRAZolam  2 mg Oral Daily  . buPROPion  300 mg Oral Daily  . cyclobenzaprine  20 mg Oral QHS  . nicotine  7 mg Transdermal Daily  . rOPINIRole  0.25 mg Oral QHS  . scopolamine  1 patch Transdermal Q72H  . tamsulosin  0.4 mg Oral Daily  Continuous Infusions: . ceFEPime (MAXIPIME) IV 2 g (11/22/18 0544)  . lactated ringers 50 mL/hr at 11/21/18 1212     LOS: 1 day      Debbe Odea, MD [facility] Pager: [email or web address] Password [facility] 11/22/2018, 11:21 AM

## 2018-11-23 LAB — BASIC METABOLIC PANEL
ANION GAP: 7 (ref 5–15)
BUN: 16 mg/dL (ref 6–20)
CO2: 25 mmol/L (ref 22–32)
Calcium: 8.9 mg/dL (ref 8.9–10.3)
Chloride: 110 mmol/L (ref 98–111)
Creatinine, Ser: 0.95 mg/dL (ref 0.44–1.00)
GFR calc Af Amer: >60 mL/min (ref >60)
GFR calc non Af Amer: >60 mL/min (ref >60)
Glucose, Bld: 99 mg/dL (ref 70–99)
Potassium: 3.4 mmol/L — ABNORMAL LOW (ref 3.5–5.1)
Sodium: 142 mmol/L (ref 135–145)

## 2018-11-23 LAB — CBC
HEMATOCRIT: 27.9 % — AB (ref 36.0–46.0)
Hemoglobin: 9 g/dL — ABNORMAL LOW (ref 12.0–15.0)
MCH: 30.8 pg (ref 26.0–34.0)
MCHC: 32.3 g/dL (ref 30.0–36.0)
MCV: 95.5 fL (ref 80.0–100.0)
Platelets: 102 10*3/uL — ABNORMAL LOW (ref 150–400)
RBC: 2.92 MIL/uL — ABNORMAL LOW (ref 3.87–5.11)
RDW: 14 % (ref 11.5–15.5)
WBC: 6.1 10*3/uL (ref 4.0–10.5)
nRBC: 0 % (ref 0.0–0.2)

## 2018-11-23 MED ORDER — CEPHALEXIN 500 MG PO CAPS: 500.0000 mg | ORAL_CAPSULE | Freq: Four times a day (QID) | ORAL | 0 refills | Status: DC

## 2018-11-23 MED ORDER — SENNOSIDES-DOCUSATE SODIUM 8.6-50 MG PO TABS: 2.0000 | ORAL_TABLET | Freq: Two times a day (BID) | ORAL | 0 refills | Status: DC

## 2018-11-23 MED ORDER — TAMSULOSIN HCL 0.4 MG PO CAPS: 0.4000 mg | ORAL_CAPSULE | Freq: Every day | ORAL | 0 refills | Status: DC

## 2018-11-23 MED ORDER — POTASSIUM CHLORIDE CRYS ER 20 MEQ PO TBCR
40.0000 meq | EXTENDED_RELEASE_TABLET | Freq: Once | ORAL | Status: AC
  Administered 2018-11-23: 40 meq via ORAL
  Filled 2018-11-23: qty 2

## 2018-11-23 MED ORDER — SENNOSIDES-DOCUSATE SODIUM 8.6-50 MG PO TABS
2.0000 | ORAL_TABLET | Freq: Two times a day (BID) | ORAL | Status: DC
  Administered 2018-11-23: 2 via ORAL
  Filled 2018-11-23: qty 2

## 2018-11-23 MED ORDER — POLYETHYLENE GLYCOL 3350 17 G PO PACK
17.0000 g | PACK | Freq: Every day | ORAL | Status: DC
  Administered 2018-11-23: 17 g via ORAL
  Filled 2018-11-23: qty 1

## 2018-11-23 MED ORDER — CEPHALEXIN 500 MG PO CAPS
500.0000 mg | ORAL_CAPSULE | Freq: Four times a day (QID) | ORAL | Status: DC
  Administered 2018-11-23: 500 mg via ORAL
  Filled 2018-11-23: qty 1

## 2018-11-23 NOTE — Progress Notes (Signed)
IP PROGRESS NOTE  Subjective:   Norma Colon has a history of rectal cancer.  She is currently completing adjuvant chemotherapy.  She began cycle 2 adjuvant CAPOX 11/07/2018.  She last took Xeloda 11/19/2018. She presented to emergency room 11/19/2018 with diarrhea.  Continues to have stomach cramping.  She is eating well.  She has not had any recurrent diarrhea since admission.  Has not had a bowel movement in about 2 days.  Denies nausea and vomiting.  Foley catheter was discontinued yesterday.  She reports having ongoing bloody to brown-colored urine.  She also reports pain related to her stent when she is up moving.  She requires IV Dilaudid alternating with oral hydrocodone.  Remains on IV cefepime.  Afebrile for the past 24 hours.  Objective: Vital signs in last 24 hours: Blood pressure 137/76, pulse 71, temperature 97.8 F (36.6 C), temperature source Oral, resp. rate 18, height _0  (1.854 m), weight 186 lb 11.2 oz (84.7 kg), SpO2 100 %.  Intake/Output from previous day: 02/18 0701 - 02/19 0700 In: 3825 [P.O.:924; I.V.:300; IV Piggyback:384] Out: 2200 [Urine:2200]  Physical Exam:  HEENT: No thrush or ulcers Lungs: End inspiratory rales at the left posterior base, no respiratory distress Cardiac: Regular rate and rhythm Abdomen: Soft, no hepatosplenomegaly, mild diffuse tenderness Extremities: No leg edema Skin: Palms and soles without erythema  Lab Results: Recent Labs    11/21/18 0559 11/23/18 0412  WBC 3.1* 6.1  HGB 8.7* 9.0*  HCT 26.9* 27.9*  PLT 67* 102*    BMET Recent Labs    11/22/18 0821 11/23/18 0412  NA 141 142  K 3.6 3.4*  CL 112* 110  CO2 23 25  GLUCOSE 130* 99  BUN 13 16  CREATININE 0.88 0.95  CALCIUM 8.5* 8.9    No results found for: CEA1  Studies/Results: Dg C-arm 1-60 Min-no Report  Result Date: 11/21/2018 Fluoroscopy was utilized by the requesting physician.  No radiographic interpretation.    Medications: I have reviewed the patient's  current medications.  Assessment/Plan:  1. Rectal cancer ? 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 CTs 05/24/2018-mass beginning at proximal he 7.6 cm from the anal verge, "20 "perirectal lymph nodes, no evidence of metastatic disease ? Pelvic MRI 06/01/2018-tumor measured at 10.8 cm from the anal sphincter, T3b, N1-2 left perirectal lymph nodes, each measuring 5 mm ? Radiation and Xeloda initiated 06/13/2018, completed 07/20/2018 ? Low anterior resection 09/16/2018,ypT3,ypN1c, 1 cm tumor, partial response-score 2, 0/13 lymph nodes positive, 2 tumor deposits, MSI-stable, no loss of mismatch repair protein expression ? Cycle 1 CAPOX 10/17/2018 ? Cycle 2 CAPOX 11/07/2018 (oxaliplatin dose reduced due to neutropenia), Xeloda discontinued 11/19/2018 secondary to diarrhea 2. Tobacco use 3. Right UPJ stone on CT 05/24/2018  CT 11/21/2018-migration of the right UPJ stone, now at the level of the pelvic brim with marked right hydroureteronephrosis  Placement of right ureteral stent on 11/21/2018. 4. Migraine headaches 5. Restless legs 6. Arm pain during the oxaliplatin infusion and for 1 week after cycle 1.  The oxaliplatin will be further diluted and infusion time extended getting with cycle 2. 7. Neutropenia secondary to chemotherapy.  Oxaliplatin dose reduced beginning with cycle 2. 8. Fever 11/21/2018-likely secondary to a urinary tract infection 9. Diarrhea secondary to capecitabine  Ms. Clapham is now at Day 17 following cycle 2 CAPOX given as adjuvant treatment for stage III rectal cancer.  She is admitted  with diarrhea, abdominal pain, nausea, and a fever.  The diarrhea is likely secondary to capecitabine induced enteritis.  The fever may be secondary to enteritis versus the right hydronephrosis/urinary infection. No diarrhea or vomiting since admission. Afebrile for the past  24 hours.   Recommendations: 1.  Continue Imodium/Lomotil for diarrhea as needed.  No bowel movement in 2 days.  Has been started on Senokot as per primary team. 2.  Intravenous hydration, replete potassium per the medical service. 3.  Management of the right kidney stone per urology. S/P right ureteral stent on 11/21/2018. Will have stone extraction as outpatient.  4.  Antibiotics, cultures are negative to date. 5.  Currently on off week of Xeloda. Patient has indicated that she does not want additional chemo. Will discuss at follow up appointment.  6.  Follow-up as scheduled at the Cancer center 11/28/2018 7.  Please call Oncology as needed.     LOS: 2 days   Mikey Bussing, NP   11/23/2018, 8:54 AM

## 2018-11-23 NOTE — Discharge Summary (Signed)
Discharge Summary  Norma Colon LKG:[id number] DOB: Oct 07, 1960  PCP: Dian Queen, MD  Admit date: 11/20/2018 Discharge date: 11/23/2018  Time spent: 35 minutes  Recommendations for Outpatient Follow-up:  1. Follow-up with oncology 2. Follow-up with urology 3. Follow-up with your PCP 4. Take your medications as prescribed  Discharge Diagnoses:  Active Hospital Problems   Diagnosis Date Noted  . Sepsis ([place]) 11/21/2018  . Insomnia   . Pancytopenia ([place]) 11/21/2018  . Right ureteral stone 11/21/2018  . Acute lower UTI 11/21/2018  . 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 SIRS (systemic inflammatory response syndrome) ([place]) 11/21/2018 11/21/2018    Discharge Condition: Stable  Diet recommendation: Resume previous diet  Vitals:   11/22/18 2129 11/23/18 0512  BP: 120/71 137/76  Pulse: 75 71  Resp: 16 18  Temp: 98.3 F (36.8 C) 97.8 F (36.6 C)  SpO2: 99% 100%    History of present illness:  Norma Colon is a 58 y.o.femalewithhistory of rectal carcinoma recent surgery in December 2019 presently on chemotherapy who presents with nausea, vomiting, diarrhea and abdominal pain. She stopped her Xeloda as she suspects that it was the cause of her symptoms.  In the ED, she is found to have a right sided nephrolithiasis with hydronephrosis and a UA concerning for a UTI.  11/23/2018: Patient seen and examined at bedside.  No acute events overnight.  She is eating her breakfast without any difficulty.  Still has some cramping in her lower abdomen to palpation but no vomiting or diarrhea.  Vital signs and lab studies done today reviewed and are unremarkable.  She requested laxative for constipation.  On the day of discharge, the patient was hemodynamically stable.  She will need to follow-up with oncology and her PCP post hospitalization  Hospital Course:  Principal Problem:   Sepsis [facility]) Active  Problems:   Adenocarcinoma of rectum ([place])   Pancytopenia ([place])   Right ureteral stone   Acute lower UTI   Insomnia  Resolved sepsis  -  Right ureteral stone causing hydroneprosis   Acute lower UTI - s/p stenting on 11/21/2018 right 6 x 26 JJ stent placement-urine culture negative, no growth. Continue Flomax and Keflex x5 days - Follow-up with urology  Hypokalemia - treated - likely was secondary to diarrhea - Follow-up with your PCP   Resolved vomiting and diarrhea-  Adenocarcinoma of rectum   - Xeloda has been discontinued by the patient as her GI symptoms started when the dose was recently increased - stool pathogen panel has been negative x 2 - symptoms have resolved -  further management per oncology - f/u on planned on 11/28/18    Resolved pancytopenia - likely due to chemo -WBC 6.1 from 3.1 yesterday - Platelet 102K from 67K yesterday -Follow-up with Dr. Melvia Heaps  Nicotine abuse - cont Nicoderm patch  Chronic depression/ anxiety -Continue Wellbutrin, Xanax  DVT prophylaxis: SCDs Code Status: Full code Family Communication:  None at bedside  Consultants:   Urology  oncology Procedures:   Ureteral stenting Antimicrobials:             Anti-infectives (From admission, onward)     Discharge Exam: BP 137/76 (BP Location: Right Arm)   Pulse 71   Temp 97.8 F (36.6 C) (Oral)   Resp 18   Ht 6\' 1"  (1.854 m)   Wt 84.7 kg   LMP  (LMP Unknown)   SpO2 100%   BMI 24.63  kg/m  . General: 58 y.o. year-old female well developed well nourished in no acute distress.  Alert and oriented x3. . Cardiovascular: Regular rate and rhythm with no rubs or gallops.  No thyromegaly or JVD noted.   [name] Respiratory: Clear to auscultation with no wheezes or rales. Good inspiratory effort. . Abdomen: Soft nontender nondistended with normal bowel sounds x4 quadrants. . Musculoskeletal: No lower extremity edema. 2/4 pulses in all 4 extremities. . Skin: No ulcerative lesions  noted or rashes, . Psychiatry: Mood is appropriate for condition and setting  Discharge Instructions You were cared for by a hospitalist during your hospital stay. If you have any questions about your discharge medications or the care you received while you were in the hospital after you are discharged, you can call the unit and asked to speak with the hospitalist on call if the hospitalist that took care of you is not available. Once you are discharged, your primary care physician will handle any further medical issues. Please note that NO REFILLS for any discharge medications will be authorized once you are discharged, as it is imperative that you return to your primary care physician (or establish a relationship with a primary care physician if you do not have one) for your aftercare needs so that they can reassess your need for medications and monitor your lab values.   Allergies as of 11/23/2018      Reactions   Codeine Other (See Comments)   Causes migraine--patient requests this be kept on allergy list      Medication List    STOP taking these medications   capecitabine 500 MG tablet Commonly known as:  XELODA   dicyclomine 20 MG tablet Commonly known as:  BENTYL   diphenoxylate-atropine 2.5-0.025 MG tablet Commonly known as:  LOMOTIL     TAKE these medications   alprazolam 2 MG tablet Commonly known as:  XANAX Take 2 mg by mouth daily after breakfast.   aspirin 81 MG tablet Take 81 mg by mouth daily.   bismuth subsalicylate 161 MG chewable tablet Commonly known as:  PEPTO BISMOL Chew 524 mg by mouth as needed for indigestion or diarrhea or loose stools.   cephALEXin 500 MG capsule Commonly known as:  KEFLEX Take 1 capsule (500 mg total) by mouth every 6 (six) hours.   cyclobenzaprine 10 MG tablet Commonly known as:  FLEXERIL Take 20 mg by mouth at bedtime.   HYDROcodone-acetaminophen 5-325 MG tablet Commonly known as:  NORCO/VICODIN Take 1 tablet by mouth every  6 (six) hours as needed for moderate pain.   hyoscyamine 0.125 MG/5ML Elix Commonly known as:  LEVSIN Take 0.125 mg by mouth every 4 (four) hours as needed for cramping (bloating).   mupirocin ointment 2 % Commonly known as:  BACTROBAN Apply to the effected area TID until better   nicotine 14 mg/24hr patch Commonly known as:  NICODERM CQ - dosed in mg/24 hours Place 1 patch (14 mg total) onto the skin daily.   potassium chloride SA 20 MEQ tablet Commonly known as:  K-DUR,KLOR-CON Take 1 tablet (20 mEq total) by mouth daily.   prochlorperazine 10 MG tablet Commonly known as:  COMPAZINE Take 1 tablet (10 mg total) by mouth every 6 (six) hours as needed for nausea or vomiting.   rOPINIRole 0.25 MG tablet Commonly known as:  REQUIP Take 0.25 mg by mouth at bedtime.   senna-docusate 8.6-50 MG tablet Commonly known as:  Senokot-S Take 2 tablets by mouth 2 (two) times  daily.   tamsulosin 0.4 MG Caps capsule Commonly known as:  FLOMAX Take 1 capsule (0.4 mg total) by mouth daily. Start taking on:  November 24, 2018   vitamin C 1000 MG tablet Take 1,000 mg by mouth daily.   WELLBUTRIN XL 300 MG 24 hr tablet Generic drug:  buPROPion Take 300 mg by mouth daily after breakfast.      Allergies  Allergen Reactions  . Codeine Other (See Comments)    Causes migraine--patient requests this be kept on allergy list      The results of significant diagnostics from this hospitalization (including imaging, microbiology, ancillary and laboratory) are listed below for reference.    Significant Diagnostic Studies: Ct Abdomen Pelvis W Contrast  Result Date: 11/21/2018 CLINICAL DATA:  Abdominal pain, diarrhea and nausea with vomiting. EXAM: CT ABDOMEN AND PELVIS WITH CONTRAST TECHNIQUE: Multidetector CT imaging of the abdomen and pelvis was performed using the standard protocol following bolus administration of intravenous contrast. CONTRAST:  133mL ISOVUE-300 IOPAMIDOL (ISOVUE-300)  INJECTION 61% COMPARISON:  05/24/2018 FINDINGS: Lower chest: Top normal heart size without pericardial effusion or thickening. Clear lung bases with subsegmental atelectasis and/or scarring on the left. No effusion or pneumothorax. Hepatobiliary: Cholesterol stones noted within the gallbladder without secondary signs of acute cholecystitis. Homogeneous enhancement of the liver without space-occupying mass. No biliary dilatation. Pancreas: No ductal dilatation, mass or inflammation. Spleen: Normal Adrenals/Urinary Tract: Normal bilateral adrenal glands. Marked right-sided hydroureteronephrosis secondary to a 4 x 4 x 4.3 mm ureteral calculus at the pelvic brim, series 4/91 and series 2/59. The urinary bladder is decompressed in appearance. The left renal collecting system and kidney are unremarkable. Stomach/Bowel: Moderate fluid-filled distention of the stomach. Normal small bowel rotation without mechanical bowel obstruction. Moderate stool retention within colon. No CT evidence of acute appendicitis. Staple line across the rectum low anterior resection Vascular/Lymphatic: No significant vascular findings are present. No enlarged abdominal or pelvic lymph nodes. Reproductive: Uterus and bilateral adnexa are unremarkable. Other: No abdominal wall hernia or abnormality. No abdominopelvic ascites. 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 IMPRESSION: 1. There has been interval migration of previously noted right UPJ stone, now seen at the level of the pelvic brim causing marked right-sided hydroureteronephrosis. 2. Uncomplicated cholelithiasis. 3. 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 Electronically Signed   By: Ashley Royalty M.D.   On: 11/21/2018 02:10   Dg C-arm 1-60 Min-no Report  Result Date:  11/21/2018 Fluoroscopy was utilized by the requesting physician.  No radiographic interpretation.    Microbiology: Recent Results (from the past 240 hour(s))  C difficile quick scan w PCR reflex     Status: None   Collection Time: 11/19/18  8:46 PM  Result Value Ref Range Status   C Diff antigen NEGATIVE NEGATIVE Final   C Diff toxin NEGATIVE NEGATIVE Final   C Diff interpretation No C. difficile detected.  Final    Comment: Performed at [facility], [place] [address] [place], [place] [address]  Gastrointestinal Panel by PCR , Stool     Status: None   Collection Time: 11/19/18  8:46 PM  Result Value Ref Range Status   Campylobacter species NOT DETECTED NOT DETECTED Final   Plesimonas shigelloides NOT DETECTED NOT DETECTED Final   Salmonella species NOT DETECTED NOT DETECTED Final   Yersinia enterocolitica  NOT DETECTED NOT DETECTED Final   Vibrio species NOT DETECTED NOT DETECTED Final   Vibrio cholerae NOT DETECTED NOT DETECTED Final   Enteroaggregative E coli (EAEC) NOT DETECTED NOT DETECTED Final   Enteropathogenic E coli (EPEC) NOT DETECTED NOT DETECTED Final   Enterotoxigenic E coli (ETEC) NOT DETECTED NOT DETECTED Final   Shiga like toxin producing E coli (STEC) NOT DETECTED NOT DETECTED Final   Shigella/Enteroinvasive E coli (EIEC) NOT DETECTED NOT DETECTED Final   Cryptosporidium NOT DETECTED NOT DETECTED Final   Cyclospora cayetanensis NOT DETECTED NOT DETECTED Final   Entamoeba histolytica NOT DETECTED NOT DETECTED Final   Giardia lamblia NOT DETECTED NOT DETECTED Final   Adenovirus F40/41 NOT DETECTED NOT DETECTED Final   Astrovirus NOT DETECTED NOT DETECTED Final   Norovirus GI/GII NOT DETECTED NOT DETECTED Final   Rotavirus A NOT DETECTED NOT DETECTED Final   Sapovirus (I, II, IV, and V) NOT DETECTED NOT DETECTED Final    Comment: Performed at [facility], [address], [place], [place] [address]  Urine culture     Status: None    Collection Time: 11/21/18 12:07 AM  Result Value Ref Range Status   Specimen Description   Final    URINE, RANDOM Performed at  [facility], [place] [address], [place], [place] [address]    Special Requests   Final    NONE Performed at [facility], [place] [address], [place], [place] [address]    Culture   Final    NO GROWTH Performed at [facility], [place] [address], [place], [place] [address]    Report Status 11/22/2018 FINAL  Final  Blood culture (routine x 2)     Status: None (Preliminary result)   Collection Time: 11/21/18 12:15 AM  Result Value Ref Range Status   Specimen Description   Final    BLOOD LEFT ANTECUBITAL Performed at [place] [address], [place], [place] [address]    Special Requests   Final    BOTTLES DRAWN AEROBIC AND ANAEROBIC Blood Culture adequate volume Performed at [place] [address], [place], [place] [address]    Culture   Final    NO GROWTH 1 DAY Performed at [facility], [place] [address], [place], [place] [address]    Report Status PENDING  Incomplete  Blood culture (routine x 2)     Status: None (Preliminary result)   Collection Time: 11/21/18 12:20 AM  Result Value Ref Range Status   Specimen Description   Final    BLOOD RIGHT ANTECUBITAL Performed at [place] [address], [place], [place] [address]    Special Requests   Final    BOTTLES DRAWN AEROBIC AND ANAEROBIC Blood Culture adequate volume Performed at [place] [address] [place], [place] [address]    Culture   Final    NO GROWTH 1 DAY Performed at [facility], [place] [address] [place], [place] [address]    Report Status PENDING  Incomplete  Gastrointestinal Panel by PCR , Stool     Status: None   Collection Time: 11/21/18 12:00 PM  Result Value Ref Range Status   Campylobacter species NOT DETECTED NOT DETECTED Final    Plesimonas shigelloides NOT DETECTED NOT DETECTED Final   Salmonella species NOT DETECTED NOT DETECTED Final   Yersinia enterocolitica NOT DETECTED NOT DETECTED Final   Vibrio species NOT DETECTED NOT DETECTED  Final   Vibrio cholerae NOT DETECTED NOT DETECTED Final   Enteroaggregative E coli (EAEC) NOT DETECTED NOT DETECTED Final   Enteropathogenic E coli (EPEC) NOT DETECTED NOT DETECTED Final   Enterotoxigenic E coli (ETEC) NOT DETECTED NOT DETECTED Final   Shiga like toxin producing E coli (STEC) NOT DETECTED NOT DETECTED Final   Shigella/Enteroinvasive E coli (EIEC) NOT DETECTED NOT DETECTED Final   Cryptosporidium NOT DETECTED NOT DETECTED Final   Cyclospora cayetanensis NOT DETECTED NOT DETECTED Final   Entamoeba histolytica NOT DETECTED NOT DETECTED Final   Giardia lamblia NOT DETECTED NOT DETECTED Final   Adenovirus F40/41 NOT DETECTED NOT DETECTED Final   Astrovirus NOT DETECTED NOT DETECTED Final   Norovirus GI/GII NOT DETECTED NOT DETECTED Final   Rotavirus A NOT DETECTED NOT DETECTED Final   Sapovirus (I, II, IV, and V) NOT DETECTED NOT DETECTED Final    Comment: Performed at [facility], [place] [place], [place] [address]     Labs: Basic Metabolic Panel: Recent Labs  Lab 11/19/18 2046 11/21/18 0007 11/21/18 0559 11/22/18 0821 11/23/18 0412  NA 141 140 138 141 142  K 2.9* 3.0* 3.1* 3.6 3.4*  CL 105 107 110 112* 110  CO2 26 25 20* 23 25  GLUCOSE 107* 103* 91 130* 99  BUN 18 14 10 13 16   CREATININE 1.07* 1.11* 0.96 0.88 0.95  CALCIUM 8.9 8.7* 7.6* 8.5* 8.9   Liver Function Tests: Recent Labs  Lab 11/19/18 2046 11/21/18 0007 11/21/18 0559  AST 36 28 25  ALT 46* 38 32  ALKPHOS 110 93 82  BILITOT 2.3* 1.3* 1.1  PROT 7.5 6.8 5.9*  ALBUMIN 3.8 3.8 3.1*   Recent Labs  Lab 11/19/18 2046 11/21/18 0007  LIPASE 26 26   No results for input(s): AMMONIA in the last 168 hours. CBC: Recent Labs  Lab 11/19/18 2046 11/21/18 0007  11/21/18 0559 11/23/18 0412  WBC 1.2* 3.5* 3.1* 6.1  NEUTROABS 0.7* 2.6 2.2  --   HGB 12.3 10.0* 8.7* 9.0*  HCT 37.3 30.5* 26.9* 27.9*  MCV 93.7 93.8 98.5 95.5  PLT 81* 87* 67* 102*   Cardiac Enzymes: No results for input(s): CKTOTAL, CKMB, CKMBINDEX, TROPONINI in the last 168 hours. BNP: BNP (last 3 results) No results for input(s): BNP in the last 8760 hours.  ProBNP (last 3 results) No results for input(s): PROBNP in the last 8760 hours.  CBG: No results for input(s): GLUCAP in the last 168 hours.     Signed:  Kayleen Memos, MD [facility] 11/23/2018, 11:27 AM

## 2018-11-24 ENCOUNTER — Encounter (HOSPITAL_COMMUNITY): Payer: Self-pay

## 2018-11-24 ENCOUNTER — Emergency Department (HOSPITAL_COMMUNITY)
Admission: EM | Admit: 2018-11-24 | Discharge: 2018-11-24 | Disposition: A | Discharge status: Home / Self Care | Payer: 59 | Attending: Emergency Medicine | Admitting: Emergency Medicine

## 2018-11-24 ENCOUNTER — Emergency Department (HOSPITAL_COMMUNITY): Payer: 59

## 2018-11-24 ENCOUNTER — Other Ambulatory Visit: Payer: Self-pay

## 2018-11-24 DIAGNOSIS — R319 Hematuria, unspecified: Secondary | ICD-10-CM

## 2018-11-24 DIAGNOSIS — Z85048 Personal history of other malignant neoplasm of rectum, rectosigmoid junction, and anus: Secondary | ICD-10-CM | POA: Diagnosis not present

## 2018-11-24 DIAGNOSIS — Z87891 Personal history of nicotine dependence: Secondary | ICD-10-CM | POA: Diagnosis not present

## 2018-11-24 DIAGNOSIS — Z79899 Other long term (current) drug therapy: Secondary | ICD-10-CM | POA: Insufficient documentation

## 2018-11-24 DIAGNOSIS — Z7982 Long term (current) use of aspirin: Secondary | ICD-10-CM | POA: Diagnosis not present

## 2018-11-24 DIAGNOSIS — Z9221 Personal history of antineoplastic chemotherapy: Secondary | ICD-10-CM | POA: Diagnosis not present

## 2018-11-24 DIAGNOSIS — R109 Unspecified abdominal pain: Secondary | ICD-10-CM | POA: Diagnosis present

## 2018-11-24 DIAGNOSIS — R110 Nausea: Secondary | ICD-10-CM | POA: Diagnosis not present

## 2018-11-24 DIAGNOSIS — N939 Abnormal uterine and vaginal bleeding, unspecified: Secondary | ICD-10-CM | POA: Diagnosis not present

## 2018-11-24 LAB — CBC WITH DIFFERENTIAL/PLATELET
Abs Immature Granulocytes: 0.11 10*3/uL — ABNORMAL HIGH (ref 0.00–0.07)
BASOS ABS: 0 10*3/uL (ref 0.0–0.1)
Basophils Relative: 0 %
Eosinophils Absolute: 0 10*3/uL (ref 0.0–0.5)
Eosinophils Relative: 1 %
HCT: 29.4 % — ABNORMAL LOW (ref 36.0–46.0)
Hemoglobin: 9.7 g/dL — ABNORMAL LOW (ref 12.0–15.0)
Immature Granulocytes: 3 %
Lymphocytes Relative: 19 %
Lymphs Abs: 0.7 10*3/uL (ref 0.7–4.0)
MCH: 31.6 pg (ref 26.0–34.0)
MCHC: 33 g/dL (ref 30.0–36.0)
MCV: 95.8 fL (ref 80.0–100.0)
Monocytes Absolute: 0.8 10*3/uL (ref 0.1–1.0)
Monocytes Relative: 22 %
Neutro Abs: 2 10*3/uL (ref 1.7–7.7)
Neutrophils Relative %: 55 %
Platelets: 142 10*3/uL — ABNORMAL LOW (ref 150–400)
RBC: 3.07 MIL/uL — AB (ref 3.87–5.11)
RDW: 15 % (ref 11.5–15.5)
WBC: 3.7 10*3/uL — AB (ref 4.0–10.5)
nRBC: 0.5 % — ABNORMAL HIGH (ref 0.0–0.2)

## 2018-11-24 LAB — COMPREHENSIVE METABOLIC PANEL
ALT: 44 U/L (ref 0–44)
AST: 52 U/L — ABNORMAL HIGH (ref 15–41)
Albumin: 3.6 g/dL (ref 3.5–5.0)
Alkaline Phosphatase: 77 U/L (ref 38–126)
Anion gap: 11 (ref 5–15)
BUN: 12 mg/dL (ref 6–20)
CO2: 23 mmol/L (ref 22–32)
Calcium: 9.3 mg/dL (ref 8.9–10.3)
Chloride: 104 mmol/L (ref 98–111)
Creatinine, Ser: 0.95 mg/dL (ref 0.44–1.00)
GFR calc Af Amer: >60 mL/min (ref >60)
Glucose, Bld: 92 mg/dL (ref 70–99)
Potassium: 3.9 mmol/L (ref 3.5–5.1)
Sodium: 138 mmol/L (ref 135–145)
Total Bilirubin: 0.5 mg/dL (ref 0.3–1.2)
Total Protein: 6.8 g/dL (ref 6.5–8.1)

## 2018-11-24 LAB — URINALYSIS, ROUTINE W REFLEX MICROSCOPIC
BILIRUBIN URINE: NEGATIVE
Glucose, UA: NEGATIVE mg/dL
Ketones, ur: NEGATIVE mg/dL
NITRITE: NEGATIVE
Protein, ur: 100 mg/dL — AB
RBC / HPF: >50 RBC/hpf — ABNORMAL HIGH (ref 0–5)
Specific Gravity, Urine: 1.006 (ref 1.005–1.030)
pH: 6 (ref 5.0–8.0)

## 2018-11-24 MED ORDER — MORPHINE SULFATE (PF) 2 MG/ML IV SOLN
2.0000 mg | Freq: Once | INTRAVENOUS | Status: AC
  Administered 2018-11-24: 2 mg via INTRAVENOUS
  Filled 2018-11-24: qty 1

## 2018-11-24 MED ORDER — SODIUM CHLORIDE 0.9 % IV BOLUS
500.0000 mL | Freq: Once | INTRAVENOUS | Status: AC
  Administered 2018-11-24: 500 mL via INTRAVENOUS

## 2018-11-24 MED ORDER — IOPAMIDOL (ISOVUE-300) INJECTION 61%
100.0000 mL | Freq: Once | INTRAVENOUS | Status: AC | PRN
  Administered 2018-11-24: 100 mL via INTRAVENOUS

## 2018-11-24 NOTE — ED Provider Notes (Addendum)
[place] DEPT Provider Note   CSN: [id number] Arrival date & time: 11/24/18  1911    History   Chief Complaint Chief Complaint  Patient presents with  . Flank Pain  . Weakness  . cancer patient    HPI Norma Colon is a 58 y.o. female presenting for evaluation of right flank pain, nausea, concerns for dehydration.  Patient states that since being discharged from the hospital yesterday, she has had continued nausea and right-sided flank pain.  She is concerned she is dehydrated.  Patient states she has not taken anything for her nausea or pain.  She reports continued hematuria after stent placement last week.  Patient with a complex medical history, was diagnosed with rectal cancer in fall 2019.  Has undergone chemotherapy and colon resection.  She is currently finishing a preventative chemo treatment, will finish in 2 days.  Last week, patient was found to have an infected stone, stent was placed on the 17th, 3 days ago, with plan for lithotripsy on Monday, in 4 days.  She denies fevers, chills, chest pain, shortness of breath, vomiting, abnormal bowel movements.  Pain is constant, mostly in her flank and radiates to her right abdomen.       HPI  Past Medical History:  Diagnosis Date  . Anxiety   . Arthritis   . Cancer ([place])   . Depression   . Headache    migraines   . HPV in female   . Restless leg syndrome   . Scoliosis     Patient Active Problem List   Diagnosis Date Noted  . Insomnia   . Pancytopenia ([place]) 11/21/2018  . Right ureteral stone 11/21/2018  . Acute lower UTI 11/21/2018  . Sepsis ([place]) 11/21/2018  . Acute cystitis with hematuria   . Rectal cancer ([place]) 09/16/2018  . 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 COLPOSCOPY  04/04/2018  . CYSTOSCOPY WITH STENT PLACEMENT Right 11/21/2018   Procedure: CYSTOSCOPY, RIGHT RETROGRADE PYELOGRAM, WITH RIGHT URETERAL STENT  PLACEMENT;  Surgeon: Cleon Gustin, MD;  Location: [facility];  Service: Urology;  Laterality: Right;  . left forearm surgery due to calcium rock     . MRI  05/2018   [facility]   . SHOULDER ARTHROSCOPY W/ ROTATOR CUFF REPAIR Right   . XI ROBOTIC ASSISTED LOWER ANTERIOR RESECTION N/A 09/16/2018   Procedure: XI ROBOTIC ASSISTED LOWER ANTERIOR RESECTION ERAS PATHWAY;  Surgeon: Leighton Ruff, MD;  Location: [facility];  Service: General;  Laterality: N/A;     OB History   No obstetric history on file.      Home Medications    Prior to Admission medications   Medication Sig Start Date End Date Taking? Authorizing Provider  alprazolam Duanne Moron) 2 MG tablet Take 2 mg by mouth daily after breakfast.    Yes [provider]  Ascorbic Acid (VITAMIN C) 1000 MG tablet Take 1,000 mg by mouth daily.   Yes [provider]  aspirin 81 MG tablet Take 81 mg by mouth daily.   Yes [provider]  buPROPion (WELLBUTRIN XL) 300 MG 24 hr tablet Take 300 mg by mouth daily after breakfast.    Yes [provider]  cephALEXin (KEFLEX) 500 MG capsule Take 1 capsule (500 mg total) by mouth every 6 (six) hours. 11/23/18  Yes Kayleen Memos, DO  cyclobenzaprine (FLEXERIL) 10 MG tablet Take 20 mg by mouth at bedtime.  Yes [provider]  nicotine (NICODERM CQ - DOSED IN MG/24 HOURS) 14 mg/24hr patch Place 1 patch (14 mg total) onto the skin daily. 16/10/96  Yes Leighton Ruff, MD  potassium chloride SA (K-DUR,KLOR-CON) 20 MEQ tablet Take 1 tablet (20 mEq total) by mouth daily. 11/07/18  Yes Owens Shark, NP  rOPINIRole (REQUIP) 0.25 MG tablet Take 0.25 mg by mouth at bedtime.   Yes [provider]  senna-docusate (SENOKOT-S) 8.6-50 MG tablet Take 2 tablets by mouth 2 (two) times daily. 11/23/18  Yes Kayleen Memos, DO  tamsulosin (FLOMAX) 0.4 MG CAPS capsule Take 1 capsule (0.4 mg total) by mouth daily. 11/24/18  Yes Kayleen Memos, DO  bismuth  subsalicylate (PEPTO BISMOL) 262 MG chewable tablet Chew 524 mg by mouth as needed for indigestion or diarrhea or loose stools.    [provider]  hyoscyamine (LEVSIN) 0.125 MG/5ML ELIX Take 0.125 mg by mouth every 4 (four) hours as needed for cramping (bloating).    [provider]  mupirocin ointment (BACTROBAN) 2 % Apply to the effected area TID until better Patient not taking: Reported on 11/19/2018 10/17/18   Harle Stanford., PA-C  prochlorperazine (COMPAZINE) 10 MG tablet Take 1 tablet (10 mg total) by mouth every 6 (six) hours as needed for nausea or vomiting. 10/10/18   Ladell Pier, MD    Family History Family History  Problem Relation Age of Onset  . COPD Father        smoker  . Stomach cancer Father        spread to lungs and bones  . Arthritis Mother   . Heart attack Paternal Grandmother   . Heart attack Paternal Grandfather   . Heart attack Maternal Grandmother   . Stroke Maternal Grandmother   . Colon cancer Neg Hx     Social History Social History   Tobacco Use  . Smoking status: Former Smoker    Packs/day: 1.00    Years: 30.00    Pack years: 30.00    Types: Cigarettes    Last attempt to quit: 09/16/2018    Years since quitting: 0.1  . Smokeless tobacco: Never Used  . Tobacco comment: tobacco info given 05/04/18  Substance Use Topics  . Alcohol use: Yes    Comment: social  . Drug use: Never     Allergies   Codeine   Review of Systems Review of Systems  Gastrointestinal: Positive for nausea.  Genitourinary: Positive for flank pain.  Allergic/Immunologic: Positive for immunocompromised state.  All other systems reviewed and are negative.    Physical Exam Updated Vital Signs BP 104/63 (BP Location: Left Arm)   Pulse 92   Temp 98.3 F (36.8 C) (Oral)   Resp 18   Ht 5\' 9"  (1.753 m)   Wt 84.4 kg   LMP  (LMP Unknown)   SpO2 95%   BMI 27.47 kg/m   Physical Exam Vitals signs and nursing note reviewed.  Constitutional:       General: She is not in acute distress.    Appearance: She is well-developed.     Comments: Appears nontoxic  HENT:     Head: Normocephalic and atraumatic.  Eyes:     Conjunctiva/sclera: Conjunctivae normal.     Pupils: Pupils are equal, round, and reactive to light.  Neck:     Musculoskeletal: Normal range of motion and neck supple.  Cardiovascular:     Rate and Rhythm: Normal rate and regular rhythm.  Pulses: Normal pulses.  Pulmonary:     Effort: Pulmonary effort is normal. No respiratory distress.     Breath sounds: Normal breath sounds. No wheezing.  Abdominal:     General: There is no distension.     Palpations: Abdomen is soft. There is no mass.     Tenderness: There is abdominal tenderness. There is right CVA tenderness. There is no guarding or rebound.     Comments: Tenderness palpation of right flank and right-sided abdomen.  No rigidity, guarding, distention.  Negative rebound.  No signs of peritonitis.  Musculoskeletal: Normal range of motion.  Skin:    General: Skin is warm and dry.     Capillary Refill: Capillary refill takes less than 2 seconds.  Neurological:     Mental Status: She is alert and oriented to person, place, and time.      ED Treatments / Results  Labs (all labs ordered are listed, but only abnormal results are displayed) Labs Reviewed  CBC WITH DIFFERENTIAL/PLATELET - Abnormal; Notable for the following components:      Result Value   WBC 3.7 (*)    RBC 3.07 (*)    Hemoglobin 9.7 (*)    HCT 29.4 (*)    Platelets 142 (*)    nRBC 0.5 (*)    Abs Immature Granulocytes 0.11 (*)    All other components within normal limits  COMPREHENSIVE METABOLIC PANEL - Abnormal; Notable for the following components:   AST 52 (*)    All other components within normal limits  URINALYSIS, ROUTINE W REFLEX MICROSCOPIC - Abnormal; Notable for the following components:   Hgb urine dipstick LARGE (*)    Protein, ur 100 (*)    Leukocytes,Ua TRACE (*)    RBC /  HPF >50 (*)    Bacteria, UA RARE (*)    All other components within normal limits  URINE CULTURE    EKG None  Radiology Ct Abdomen Pelvis W Contrast  Result Date: 11/24/2018 CLINICAL DATA:  58 y/o F; increased nausea and pain status post stent placement 3 days ago. EXAM: CT ABDOMEN AND PELVIS WITH CONTRAST TECHNIQUE: Multidetector CT imaging of the abdomen and pelvis was performed using the standard protocol following bolus administration of intravenous contrast. CONTRAST:  167mL ISOVUE-300 IOPAMIDOL (ISOVUE-300) INJECTION 61% COMPARISON:  11/21/2018 CT abdomen and pelvis. FINDINGS: Lower chest: No acute abnormality. Hepatobiliary: No focal liver abnormality is seen. Cholelithiasis. No gallbladder wall thickening or biliary dilatation. Pancreas: Unremarkable. No pancreatic ductal dilatation or surrounding inflammatory changes. Spleen: Normal in size without focal abnormality. Adrenals/Urinary Tract: Normal adrenal glands. Right ureteral stent is well positioned. No hydronephrosis. No obstructing stone identified. Urothelial enhancement of the right ureter (series 5, image 73). No focal kidney lesion. Bladder is unremarkable. Stomach/Bowel: Patent colorectal anastomosis. Normal appendix. No obstructive or inflammatory changes of the small and large bowel. Normal appearance of the stomach. Vascular/Lymphatic: No significant vascular findings are present. No enlarged abdominal or pelvic lymph nodes. Reproductive: Uterus and bilateral adnexa are unremarkable. Other: No abdominal wall hernia or abnormality. No abdominopelvic ascites. Musculoskeletal: Grade 2 L5-S1 anterolisthesis with advanced facet arthropathy. No acute osseous abnormality identified. IMPRESSION: 1. Right ureteral stent is well positioned. No obstructing stone, hydronephrosis, or abscess identified. Urothelial enhancement of the right ureter may be due to recent intervention or reflect pyelitis. 2. Cholelithiasis. 3. Grade 2 L5-S1  anterolisthesis with advanced facet arthropathy. Electronically Signed   By: Kristine Garbe M.D.   On: 11/24/2018 22:34    Procedures  Procedures (including critical care time)  Medications Ordered in ED Medications  sodium chloride 0.9 % bolus 500 mL (0 mLs Intravenous Stopped 11/24/18 2153)  morphine 2 MG/ML injection 2 mg (2 mg Intravenous Given 11/24/18 2043)  iopamidol (ISOVUE-300) 61 % injection 100 mL (100 mLs Intravenous Contrast Given 11/24/18 2212)     Initial Impression / Assessment and Plan / ED Course  I have reviewed the triage vital signs and the nursing notes.  Pertinent labs & imaging results that were available during my care of the patient were reviewed by me and considered in my medical decision making (see chart for details).        Patient presenting for evaluation of nausea and flank pain s/p stent placement.  Exam reassuring, patient appears nontoxic. She is afebrile, no active vomiting.  However, considering recent history, will obtain labs, urine, and reassess.  Small dose of morphine given for pain control.  Labs with mild leukopenia 3.7, baseline from several days ago, however change from yesterday.  Creatinine stable.  Electrolytes stable.  Urine pending.  Discussed findings with patient, who elects to get a CT scan for reevaluation. Pain is improved.   On reevaluation, patient reports she has been having some mild vaginal bleeding.  Offered pelvic exam, patient declined today.  CT reassuring, shows postop changes.  Shows cholelithiasis, which is been present previously.  Urine improved from previous, infection appears to be clearing.  Culture sent.  Discussed findings with patient.  Discussed importance of follow-up with urology as scheduled and continued home medication use. Case discussed with attending, Dr. Wilson Singer agrees to plan. At this time, patient appears safe for discharge.  Return precautions given.  Patient states she understands and agrees  to plan.   Final Clinical Impressions(s) / ED Diagnoses   Final diagnoses:  Hematuria, unspecified type  Flank pain  Nausea  Vagina bleeding    ED Discharge Orders    None       Erick Alley 11/24/18 2217    Virgel Manifold, MD 11/24/18 2244    Franchot Heidelberg, PA-C 11/24/18 2254    Virgel Manifold, MD 11/24/18 2328

## 2018-11-24 NOTE — Discharge Instructions (Addendum)
Continue taking home medications as prescribed. Make sure you are staying well-hydrated water. Follow-up with the urologist as scheduled. Follow-up with OB/GYN as needed if vaginal bleeding persists. Return to the emergency room if you develop fevers, persistent vomiting, severe worsening pain, or any new, worsening, or concerning symptoms.

## 2018-11-24 NOTE — ED Triage Notes (Signed)
Pt arrived by POV due to weakness, dizziness, and right sided flank pain. Pt stated she was diagnosed with a kidney stone in September 2019. Kidney stone has not yet passed. Pt is currently taking chemo pills for rectal cancer. Last chemo pill was taken last Saturday morning.

## 2018-11-26 LAB — CULTURE, BLOOD (ROUTINE X 2)
Culture: NO GROWTH
Culture: NO GROWTH
Special Requests: ADEQUATE
Special Requests: ADEQUATE

## 2018-11-26 LAB — URINE CULTURE: Culture: NO GROWTH

## 2018-11-27 ENCOUNTER — Other Ambulatory Visit: Payer: Self-pay | Admitting: Oncology

## 2018-11-28 ENCOUNTER — Inpatient Hospital Stay (HOSPITAL_BASED_OUTPATIENT_CLINIC_OR_DEPARTMENT_OTHER): Payer: 59 | Admitting: Oncology

## 2018-11-28 ENCOUNTER — Inpatient Hospital Stay: Payer: 59

## 2018-11-28 ENCOUNTER — Ambulatory Visit: Payer: 59 | Admitting: Oncology

## 2018-11-28 VITALS — BP 108/65 | HR 93 | Temp 98.5°F | Resp 19 | Ht 69.0 in | Wt 180.1 lb

## 2018-11-28 DIAGNOSIS — C20 Malignant neoplasm of rectum: Secondary | ICD-10-CM

## 2018-11-28 DIAGNOSIS — D701 Agranulocytosis secondary to cancer chemotherapy: Secondary | ICD-10-CM | POA: Diagnosis not present

## 2018-11-28 DIAGNOSIS — T451X5D Adverse effect of antineoplastic and immunosuppressive drugs, subsequent encounter: Secondary | ICD-10-CM | POA: Diagnosis not present

## 2018-11-28 DIAGNOSIS — Z5111 Encounter for antineoplastic chemotherapy: Secondary | ICD-10-CM | POA: Diagnosis present

## 2018-11-28 DIAGNOSIS — G43909 Migraine, unspecified, not intractable, without status migrainosus: Secondary | ICD-10-CM | POA: Diagnosis not present

## 2018-11-28 LAB — CBC WITH DIFFERENTIAL (CANCER CENTER ONLY)
Abs Immature Granulocytes: 0.09 10*3/uL — ABNORMAL HIGH (ref 0.00–0.07)
Basophils Absolute: 0 10*3/uL (ref 0.0–0.1)
Basophils Relative: 0 %
EOS ABS: 0.1 10*3/uL (ref 0.0–0.5)
Eosinophils Relative: 3 %
HCT: 33.5 % — ABNORMAL LOW (ref 36.0–46.0)
Hemoglobin: 11 g/dL — ABNORMAL LOW (ref 12.0–15.0)
Immature Granulocytes: 3 %
LYMPHS ABS: 0.5 10*3/uL — AB (ref 0.7–4.0)
Lymphocytes Relative: 15 %
MCH: 31.8 pg (ref 26.0–34.0)
MCHC: 32.8 g/dL (ref 30.0–36.0)
MCV: 96.8 fL (ref 80.0–100.0)
Monocytes Absolute: 0.5 10*3/uL (ref 0.1–1.0)
Monocytes Relative: 14 %
Neutro Abs: 2.4 10*3/uL (ref 1.7–7.7)
Neutrophils Relative %: 65 %
Platelet Count: 175 10*3/uL (ref 150–400)
RBC: 3.46 MIL/uL — ABNORMAL LOW (ref 3.87–5.11)
RDW: 16.6 % — ABNORMAL HIGH (ref 11.5–15.5)
WBC Count: 3.6 10*3/uL — ABNORMAL LOW (ref 4.0–10.5)
nRBC: 0 % (ref 0.0–0.2)

## 2018-11-28 LAB — CMP (CANCER CENTER ONLY)
ALT: 31 U/L (ref 0–44)
AST: 30 U/L (ref 15–41)
Albumin: 3.6 g/dL (ref 3.5–5.0)
Alkaline Phosphatase: 95 U/L (ref 38–126)
Anion gap: 11 (ref 5–15)
BUN: 14 mg/dL (ref 6–20)
CALCIUM: 9.7 mg/dL (ref 8.9–10.3)
CO2: 28 mmol/L (ref 22–32)
Chloride: 103 mmol/L (ref 98–111)
Creatinine: 1.03 mg/dL — ABNORMAL HIGH (ref 0.44–1.00)
GFR, Est AFR Am: >60 mL/min (ref >60)
GFR, Estimated: >60 mL/min (ref >60)
Glucose, Bld: 119 mg/dL — ABNORMAL HIGH (ref 70–99)
Potassium: 3.9 mmol/L (ref 3.5–5.1)
Sodium: 142 mmol/L (ref 135–145)
Total Bilirubin: 0.4 mg/dL (ref 0.3–1.2)
Total Protein: 7.2 g/dL (ref 6.5–8.1)

## 2018-11-28 MED ORDER — PALONOSETRON HCL INJECTION 0.25 MG/5ML
0.2500 mg | Freq: Once | INTRAVENOUS | Status: AC
  Administered 2018-11-28: 0.25 mg via INTRAVENOUS

## 2018-11-28 MED ORDER — HYOSCYAMINE SULFATE 0.125 MG/5ML PO ELIX: 0.1250 mg | ORAL_SOLUTION | ORAL | 0 refills | Status: DC | PRN

## 2018-11-28 MED ORDER — PALONOSETRON HCL INJECTION 0.25 MG/5ML
INTRAVENOUS | Status: AC
  Filled 2018-11-28: qty 5

## 2018-11-28 MED ORDER — CAPECITABINE 500 MG PO TABS: ORAL_TABLET | ORAL | 0 refills | Status: DC

## 2018-11-28 MED ORDER — PROCHLORPERAZINE MALEATE 10 MG PO TABS: 10.0000 mg | ORAL_TABLET | Freq: Four times a day (QID) | ORAL | 1 refills | Status: DC | PRN

## 2018-11-28 MED ORDER — DEXAMETHASONE SODIUM PHOSPHATE 10 MG/ML IJ SOLN
INTRAMUSCULAR | Status: AC
  Filled 2018-11-28: qty 1

## 2018-11-28 MED ORDER — DEXAMETHASONE SODIUM PHOSPHATE 10 MG/ML IJ SOLN
10.0000 mg | Freq: Once | INTRAMUSCULAR | Status: AC
  Administered 2018-11-28: 10 mg via INTRAVENOUS

## 2018-11-28 MED ORDER — OXALIPLATIN CHEMO INJECTION 100 MG/20ML
100.0000 mg/m2 | Freq: Once | INTRAVENOUS | Status: AC
  Administered 2018-11-28: 200 mg via INTRAVENOUS
  Filled 2018-11-28: qty 40

## 2018-11-28 MED ORDER — DEXTROSE 5 % IV SOLN
Freq: Once | INTRAVENOUS | Status: AC
  Administered 2018-11-28: 12:00:00 via INTRAVENOUS
  Filled 2018-11-28: qty 250

## 2018-11-28 MED ORDER — SODIUM CHLORIDE 0.9 % IV SOLN
Freq: Once | INTRAVENOUS | Status: AC
  Administered 2018-11-28: 14:00:00 via INTRAVENOUS
  Filled 2018-11-28: qty 5

## 2018-11-28 NOTE — Patient Instructions (Signed)
[facility] Discharge Instructions for Patients Receiving Chemotherapy  Today you received the following chemotherapy agents: Oxaliplatin  To help prevent nausea and vomiting after your treatment, we encourage you to take your nausea medication as directed.   If you develop nausea and vomiting that is not controlled by your nausea medication, call the clinic.   BELOW ARE SYMPTOMS THAT SHOULD BE REPORTED IMMEDIATELY:  *FEVER GREATER THAN 100.5 F  *CHILLS WITH OR WITHOUT FEVER  NAUSEA AND VOMITING THAT IS NOT CONTROLLED WITH YOUR NAUSEA MEDICATION  *UNUSUAL SHORTNESS OF BREATH  *UNUSUAL BRUISING OR BLEEDING  TENDERNESS IN MOUTH AND THROAT WITH OR WITHOUT PRESENCE OF ULCERS  *URINARY PROBLEMS  *BOWEL PROBLEMS  UNUSUAL RASH Items with * indicate a potential emergency and should be followed up as soon as possible.  Feel free to call the clinic should you have any questions or concerns. The clinic phone number is [phone].  Please show the CHEMO ALERT CARD at check-in to the Emergency Department and triage nurse.   

## 2018-11-28 NOTE — Progress Notes (Signed)
  [facility]cer Center OFFICE PROGRESS NOTE  INTERVAL HISTORY:   Norma Colon returns as scheduled.  She completed cycle 2 of adjuvant CAPOX beginning 11/07/2018.  She was admitted 11/20/2018 with diarrhea, nausea, and abdominal pain.  A CT revealed marked right hydronephrosis secondary to a stone at the pelvic brim. Xeloda was discontinued 11/19/2018.  She underwent right JJ stent placement on 11/21/2018.  She was discharged home on 11/23/2018.  She presented to emergency room 11/24/2018 with right flank pain and nausea.  She also reports hematuria since the ureter stent was placed.  A CT showed good positioning of the right ureter stent with no obstructing stone.  She reports abdominal soreness since undergoing the colorectal surgery.  Mild cold sensitivity.  No other neurologic symptoms.  No hand or foot pain.  Objective:  Vital signs in last 24 hours:  Blood pressure 108/65, pulse 93, temperature 98.5 F (36.9 C), temperature source Oral, resp. rate 19, height 5' 9" (1.753 m), weight 180 lb 1.6 oz (81.7 kg), SpO2 100 %.    HEENT: No thrush or ulcers Resp: Lungs clear bilaterally Cardio: Regular rate and rhythm GI: Mild diffuse tenderness, no hepatomegaly, no mass Vascular: No leg edema  Skin: Palms without erythema  Lab Results:  Lab Results  Component Value Date   WBC 3.6 (L) 11/28/2018   HGB 11.0 (L) 11/28/2018   HCT 33.5 (L) 11/28/2018   MCV 96.8 11/28/2018   PLT 175 11/28/2018   NEUTROABS 2.4 11/28/2018    CMP  Lab Results  Component Value Date   NA 142 11/28/2018   K 3.9 11/28/2018   CL 103 11/28/2018   CO2 28 11/28/2018   GLUCOSE 119 (H) 11/28/2018   BUN 14 11/28/2018   CREATININE 1.03 (H) 11/28/2018   CALCIUM 9.7 11/28/2018   PROT 7.2 11/28/2018   ALBUMIN 3.6 11/28/2018   AST 30 11/28/2018   ALT 31 11/28/2018   ALKPHOS 95 11/28/2018   BILITOT 0.4 11/28/2018   GFRNONAA >60 11/28/2018   GFRAA >60 11/28/2018    No results found for: CEA1  No  results found for: INR  Imaging:  Ct Abdomen Pelvis W Contrast  Result Date: 11/24/2018 CLINICAL DATA:  57 y/o F; increased nausea and pain status post stent placement 3 days ago. EXAM: CT ABDOMEN AND PELVIS WITH CONTRAST TECHNIQUE: Multidetector CT imaging of the abdomen and pelvis was performed using the standard protocol following bolus administration of intravenous contrast. CONTRAST:  100mL ISOVUE-300 IOPAMIDOL (ISOVUE-300) INJECTION 61% COMPARISON:  11/21/2018 CT abdomen and pelvis. FINDINGS: Lower chest: No acute abnormality. Hepatobiliary: No focal liver abnormality is seen. Cholelithiasis. No gallbladder wall thickening or biliary dilatation. Pancreas: Unremarkable. No pancreatic ductal dilatation or surrounding inflammatory changes. Spleen: Normal in size without focal abnormality. Adrenals/Urinary Tract: Normal adrenal glands. Right ureteral stent is well positioned. No hydronephrosis. No obstructing stone identified. Urothelial enhancement of the right ureter (series 5, image 73). No focal kidney lesion. Bladder is unremarkable. Stomach/Bowel: Patent colorectal anastomosis. Normal appendix. No obstructive or inflammatory changes of the small and large bowel. Normal appearance of the stomach. Vascular/Lymphatic: No significant vascular findings are present. No enlarged abdominal or pelvic lymph nodes. Reproductive: Uterus and bilateral adnexa are unremarkable. Other: No abdominal wall hernia or abnormality. No abdominopelvic ascites. Musculoskeletal: Grade 2 L5-S1 anterolisthesis with advanced facet arthropathy. No acute osseous abnormality identified. IMPRESSION: 1. Right ureteral stent is well positioned. No obstructing stone, hydronephrosis, or abscess identified. Urothelial enhancement of the right ureter may be due to   recent intervention or reflect pyelitis. 2. Cholelithiasis. 3. Grade 2 L5-S1 anterolisthesis with advanced facet arthropathy. Electronically Signed   By: Kristine Garbe  M.D.   On: 11/24/2018 22:34    Medications: I have reviewed the patient's current medications.   Assessment/Plan: 1. Rectal cancer ? 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 CTs 05/24/2018-mass beginning at proximal he 7.6 cm from the anal verge, "20 "perirectal lymph nodes, no evidence of metastatic disease ? Pelvic MRI 06/01/2018-tumor measured at 10.8 cm from the anal sphincter, T3b, N1-2 left perirectal lymph nodes, each measuring 5 mm ? Radiation and Xeloda initiated 06/13/2018, completed 07/20/2018 ? Low anterior resection 09/16/2018,ypT3,ypN1c, 1 cm tumor, partial response-score 2, 0/13 lymph nodes positive, 2 tumor deposits, MSI-stable, no loss of mismatch repair protein expression ? Cycle 1 CAPOX 10/17/2018 ? Cycle 2 CAPOX 11/07/2018 (oxaliplatin dose reduced due to neutropenia) 2. Tobacco use 3. Right UPJ stone on CT 05/24/2018  CT 11/21/2018-migration of previously noted right UPJ stone, new marked right hydroureteronephrosis  Placement of a right JJ stent 11/21/2018 4. Migraine headaches 5. Restless legs 6. Arm pain during the oxaliplatin infusion and for 1 week after cycle 1.  The oxaliplatin will be further diluted and infusion time extended getting with cycle 2. 7. Neutropenia secondary to chemotherapy.  Oxaliplatin dose reduced beginning with cycle 2. 8. Diarrhea following cycle 2 CAPOX- Xeloda dose reduced with cycle 3    Disposition: Norma Colon appears well.  She will complete cycle 3 CAPOX beginning today.  We decided to dose reduce the Xeloda secondary to diarrhea following cycle 2.  She had delayed nausea following cycle 2.  We will add Emend to the chemotherapy regimen today.  She will follow-up with Dr. Alyson Colon for management of the right ureter stent.  25 minutes were spent with the patient today.  The majority of the time was used for  counseling and coordination of care.  Norma Coder, MD  11/28/2018  10:23 AM

## 2018-11-29 ENCOUNTER — Telehealth: Payer: Self-pay | Admitting: Oncology

## 2018-11-29 NOTE — Telephone Encounter (Signed)
Scheduled appt per 2/24 los - unable to reach pt or leave message . Sent reminder letter in the mail.

## 2018-11-30 ENCOUNTER — Other Ambulatory Visit: Payer: Self-pay | Admitting: Urology

## 2018-12-08 ENCOUNTER — Encounter (HOSPITAL_BASED_OUTPATIENT_CLINIC_OR_DEPARTMENT_OTHER): Payer: Self-pay | Admitting: *Deleted

## 2018-12-08 ENCOUNTER — Other Ambulatory Visit: Payer: Self-pay

## 2018-12-08 NOTE — Progress Notes (Addendum)
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

## 2018-12-10 ENCOUNTER — Encounter: Payer: Self-pay | Admitting: Oncology

## 2018-12-12 ENCOUNTER — Ambulatory Visit (HOSPITAL_BASED_OUTPATIENT_CLINIC_OR_DEPARTMENT_OTHER): Payer: 59 | Admitting: Anesthesiology

## 2018-12-12 ENCOUNTER — Ambulatory Visit (HOSPITAL_BASED_OUTPATIENT_CLINIC_OR_DEPARTMENT_OTHER)
Admission: RE | Admit: 2018-12-12 | Discharge: 2018-12-12 | Disposition: A | Discharge status: Home / Self Care | Payer: 59 | Attending: Urology | Admitting: Urology

## 2018-12-12 ENCOUNTER — Encounter (HOSPITAL_BASED_OUTPATIENT_CLINIC_OR_DEPARTMENT_OTHER)
Admission: RE | Disposition: A | Discharge status: Home / Self Care | Payer: Self-pay | Source: Home / Self Care | Attending: Urology

## 2018-12-12 ENCOUNTER — Other Ambulatory Visit: Payer: Self-pay

## 2018-12-12 ENCOUNTER — Encounter (HOSPITAL_BASED_OUTPATIENT_CLINIC_OR_DEPARTMENT_OTHER): Payer: Self-pay | Admitting: *Deleted

## 2018-12-12 DIAGNOSIS — M1990 Unspecified osteoarthritis, unspecified site: Secondary | ICD-10-CM | POA: Insufficient documentation

## 2018-12-12 DIAGNOSIS — F17210 Nicotine dependence, cigarettes, uncomplicated: Secondary | ICD-10-CM | POA: Insufficient documentation

## 2018-12-12 DIAGNOSIS — C20 Malignant neoplasm of rectum: Secondary | ICD-10-CM | POA: Diagnosis not present

## 2018-12-12 DIAGNOSIS — M419 Scoliosis, unspecified: Secondary | ICD-10-CM | POA: Insufficient documentation

## 2018-12-12 DIAGNOSIS — Z885 Allergy status to narcotic agent status: Secondary | ICD-10-CM | POA: Diagnosis not present

## 2018-12-12 DIAGNOSIS — Z7982 Long term (current) use of aspirin: Secondary | ICD-10-CM | POA: Diagnosis not present

## 2018-12-12 DIAGNOSIS — G2581 Restless legs syndrome: Secondary | ICD-10-CM | POA: Diagnosis not present

## 2018-12-12 DIAGNOSIS — F419 Anxiety disorder, unspecified: Secondary | ICD-10-CM | POA: Insufficient documentation

## 2018-12-12 DIAGNOSIS — Z79899 Other long term (current) drug therapy: Secondary | ICD-10-CM | POA: Insufficient documentation

## 2018-12-12 DIAGNOSIS — N201 Calculus of ureter: Secondary | ICD-10-CM | POA: Diagnosis present

## 2018-12-12 DIAGNOSIS — F329 Major depressive disorder, single episode, unspecified: Secondary | ICD-10-CM | POA: Insufficient documentation

## 2018-12-12 DIAGNOSIS — N132 Hydronephrosis with renal and ureteral calculous obstruction: Secondary | ICD-10-CM | POA: Diagnosis not present

## 2018-12-12 HISTORY — PX: CYSTOSCOPY WITH RETROGRADE PYELOGRAM, URETEROSCOPY AND STENT PLACEMENT: SHX5789

## 2018-12-12 SURGERY — CYSTOURETEROSCOPY, WITH RETROGRADE PYELOGRAM AND STENT INSERTION
Anesthesia: General | Site: Ureter | Laterality: Right

## 2018-12-12 MED ORDER — KETOROLAC TROMETHAMINE 30 MG/ML IJ SOLN
INTRAMUSCULAR | Status: DC | PRN
  Administered 2018-12-12: 30 mg via INTRAVENOUS

## 2018-12-12 MED ORDER — KETOROLAC TROMETHAMINE 30 MG/ML IJ SOLN
INTRAMUSCULAR | Status: AC
  Filled 2018-12-12: qty 1

## 2018-12-12 MED ORDER — DIPHENHYDRAMINE HCL 50 MG/ML IJ SOLN
INTRAMUSCULAR | Status: DC | PRN
  Administered 2018-12-12: 12.5 mg via INTRAVENOUS

## 2018-12-12 MED ORDER — SODIUM CHLORIDE 0.9 % IV SOLN
INTRAVENOUS | Status: AC
  Filled 2018-12-12: qty 100

## 2018-12-12 MED ORDER — MIDAZOLAM HCL 2 MG/2ML IJ SOLN
INTRAMUSCULAR | Status: AC
  Filled 2018-12-12: qty 2

## 2018-12-12 MED ORDER — MIDAZOLAM HCL 5 MG/5ML IJ SOLN
INTRAMUSCULAR | Status: DC | PRN
  Administered 2018-12-12: 2 mg via INTRAVENOUS

## 2018-12-12 MED ORDER — ONDANSETRON HCL 4 MG/2ML IJ SOLN
4.0000 mg | Freq: Once | INTRAMUSCULAR | Status: DC | PRN
  Filled 2018-12-12: qty 2

## 2018-12-12 MED ORDER — FENTANYL CITRATE (PF) 100 MCG/2ML IJ SOLN
INTRAMUSCULAR | Status: DC | PRN
  Administered 2018-12-12 (×2): 50 ug via INTRAVENOUS

## 2018-12-12 MED ORDER — ONDANSETRON HCL 4 MG/2ML IJ SOLN
INTRAMUSCULAR | Status: DC | PRN
  Administered 2018-12-12: 4 mg via INTRAVENOUS

## 2018-12-12 MED ORDER — OXYCODONE HCL 5 MG PO TABS
5.0000 mg | ORAL_TABLET | Freq: Once | ORAL | Status: DC | PRN
  Filled 2018-12-12: qty 1

## 2018-12-12 MED ORDER — OXYCODONE HCL 5 MG/5ML PO SOLN
5.0000 mg | Freq: Once | ORAL | Status: DC | PRN
  Filled 2018-12-12: qty 5

## 2018-12-12 MED ORDER — LIDOCAINE 2% (20 MG/ML) 5 ML SYRINGE
INTRAMUSCULAR | Status: DC | PRN
  Administered 2018-12-12: 60 mg via INTRAVENOUS

## 2018-12-12 MED ORDER — DIPHENHYDRAMINE HCL 50 MG/ML IJ SOLN
INTRAMUSCULAR | Status: AC
  Filled 2018-12-12: qty 1

## 2018-12-12 MED ORDER — OXYCODONE-ACETAMINOPHEN 5-325 MG PO TABS: 1.0000 | ORAL_TABLET | ORAL | 0 refills | Status: DC | PRN

## 2018-12-12 MED ORDER — DEXAMETHASONE SODIUM PHOSPHATE 10 MG/ML IJ SOLN
INTRAMUSCULAR | Status: AC
  Filled 2018-12-12: qty 1

## 2018-12-12 MED ORDER — CEFTRIAXONE SODIUM 2 G IJ SOLR
INTRAMUSCULAR | Status: AC
  Filled 2018-12-12: qty 20

## 2018-12-12 MED ORDER — ONDANSETRON HCL 4 MG/2ML IJ SOLN
INTRAMUSCULAR | Status: AC
  Filled 2018-12-12: qty 2

## 2018-12-12 MED ORDER — FENTANYL CITRATE (PF) 100 MCG/2ML IJ SOLN
INTRAMUSCULAR | Status: AC
  Filled 2018-12-12: qty 2

## 2018-12-12 MED ORDER — SODIUM CHLORIDE 0.9 % IR SOLN
Status: DC | PRN
  Administered 2018-12-12: 3000 mL

## 2018-12-12 MED ORDER — PROPOFOL 10 MG/ML IV BOLUS
INTRAVENOUS | Status: DC | PRN
  Administered 2018-12-12: 150 mg via INTRAVENOUS

## 2018-12-12 MED ORDER — FENTANYL CITRATE (PF) 100 MCG/2ML IJ SOLN
25.0000 ug | INTRAMUSCULAR | Status: DC | PRN
  Administered 2018-12-12: 25 ug via INTRAVENOUS
  Administered 2018-12-12: 50 ug via INTRAVENOUS
  Filled 2018-12-12: qty 1

## 2018-12-12 MED ORDER — IOHEXOL 300 MG/ML  SOLN
INTRAMUSCULAR | Status: DC | PRN
  Administered 2018-12-12: 4 mL via URETHRAL

## 2018-12-12 MED ORDER — DEXAMETHASONE SODIUM PHOSPHATE 10 MG/ML IJ SOLN
INTRAMUSCULAR | Status: DC | PRN
  Administered 2018-12-12: 5 mg via INTRAVENOUS

## 2018-12-12 MED ORDER — LIDOCAINE 2% (20 MG/ML) 5 ML SYRINGE
INTRAMUSCULAR | Status: AC
  Filled 2018-12-12: qty 5

## 2018-12-12 MED ORDER — PHENYLEPHRINE 40 MCG/ML (10ML) SYRINGE FOR IV PUSH (FOR BLOOD PRESSURE SUPPORT)
PREFILLED_SYRINGE | INTRAVENOUS | Status: DC | PRN
  Administered 2018-12-12 (×5): 80 ug via INTRAVENOUS

## 2018-12-12 MED ORDER — SODIUM CHLORIDE 0.9 % IV SOLN
INTRAVENOUS | Status: DC
  Administered 2018-12-12 (×2): via INTRAVENOUS
  Filled 2018-12-12: qty 1000

## 2018-12-12 MED ORDER — SODIUM CHLORIDE 0.9 % IV SOLN
2.0000 g | INTRAVENOUS | Status: AC
  Administered 2018-12-12: 2 g via INTRAVENOUS
  Filled 2018-12-12: qty 20

## 2018-12-12 SURGICAL SUPPLY — 33 items
BAG DRAIN URO-CYSTO SKYTR STRL (DRAIN) ×4 IMPLANT
BASKET STONE 1.7 NGAGE (UROLOGICAL SUPPLIES) ×4 IMPLANT
CATH INTERMIT  6FR 70CM (CATHETERS) ×4 IMPLANT
CLOTH BEACON ORANGE TIMEOUT ST (SAFETY) ×4 IMPLANT
EVACUATOR MICROVAS BLADDER (UROLOGICAL SUPPLIES) IMPLANT
EXTRACTOR STONE 1.7FRX115CM (UROLOGICAL SUPPLIES) IMPLANT
FIBER LASER FLEXIVA 1000 (UROLOGICAL SUPPLIES) IMPLANT
FIBER LASER FLEXIVA 200 (UROLOGICAL SUPPLIES) IMPLANT
FIBER LASER FLEXIVA 365 (UROLOGICAL SUPPLIES) IMPLANT
FIBER LASER FLEXIVA 550 (UROLOGICAL SUPPLIES) IMPLANT
FIBER LASER TRAC TIP (UROLOGICAL SUPPLIES) IMPLANT
GLOVE BIO SURGEON STRL SZ7 (GLOVE) ×4 IMPLANT
GLOVE BIO SURGEON STRL SZ8 (GLOVE) ×4 IMPLANT
GLOVE BIOGEL PI IND STRL 6.5 (GLOVE) ×2 IMPLANT
GLOVE BIOGEL PI INDICATOR 6.5 (GLOVE) ×2
GOWN STRL REUS W/TWL LRG LVL3 (GOWN DISPOSABLE) ×4 IMPLANT
GOWN STRL REUS W/TWL XL LVL3 (GOWN DISPOSABLE) ×4 IMPLANT
GUIDEWIRE ANG ZIPWIRE 038X150 (WIRE) ×4 IMPLANT
GUIDEWIRE STR DUAL SENSOR (WIRE) ×4 IMPLANT
INFUSOR MANOMETER BAG 3000ML (MISCELLANEOUS) IMPLANT
IV NS 1000ML (IV SOLUTION)
IV NS 1000ML BAXH (IV SOLUTION) IMPLANT
IV NS IRRIG 3000ML ARTHROMATIC (IV SOLUTION) ×4 IMPLANT
KIT TURNOVER CYSTO (KITS) ×4 IMPLANT
MANIFOLD NEPTUNE II (INSTRUMENTS) ×4 IMPLANT
NS IRRIG 500ML POUR BTL (IV SOLUTION) ×4 IMPLANT
PACK CYSTO (CUSTOM PROCEDURE TRAY) ×4 IMPLANT
STENT URET 6FRX24 CONTOUR (STENTS) ×4 IMPLANT
STENT URET 6FRX26 CONTOUR (STENTS) IMPLANT
SYR 10ML LL (SYRINGE) ×4 IMPLANT
TUBE CONNECTING 12'X1/4 (SUCTIONS) ×1
TUBE CONNECTING 12X1/4 (SUCTIONS) ×3 IMPLANT
TUBING UROLOGY SET (TUBING) ×4 IMPLANT

## 2018-12-12 NOTE — Discharge Instructions (Signed)
Post Anesthesia Home Care Instructions  Activity: Get plenty of rest for the remainder of the day. A responsible individual must stay with you for 24 hours following the procedure.  For the next 24 hours, DO NOT: -Drive a car -Paediatric nurse -Drink alcoholic beverages -Take any medication unless instructed by your physician -Make any legal decisions or sign important papers.  Meals: Start with liquid foods such as gelatin or soup. Progress to regular foods as tolerated. Avoid greasy, spicy, heavy foods. If nausea and/or vomiting occur, drink only clear liquids until the nausea and/or vomiting subsides. Call your physician if vomiting continues.  Special Instructions/Symptoms: Your throat may feel dry or sore from the anesthesia or the breathing tube placed in your throat during surgery. If this causes discomfort, gargle with warm salt water. The discomfort should disappear within 24 hours.  Ureteral Stent Implantation, Care After Refer to this sheet in the next few weeks. These instructions provide you with information about caring for yourself after your procedure. Your health care provider may also give you more specific instructions. Your treatment has been planned according to current medical practices, but problems sometimes occur. Call your health care provider if you have any problems or questions after your procedure. What can I expect after the procedure? After the procedure, it is common to have:  Nausea.  Mild pain when you urinate. You may feel this pain in your lower back or lower abdomen. Pain should stop within a few minutes after you urinate. This may last for up to 1 week.  A small amount of blood in your urine for several days. Follow these instructions at home:  Medicines  Take over-the-counter and prescription medicines only as told by your health care provider.  If you were prescribed an antibiotic medicine, take it as told by your health care provider. Do  not stop taking the antibiotic even if you start to feel better.  Do not drive for 24 hours if you received a sedative.  Do not drive or operate heavy machinery while taking prescription pain medicines. Activity  Return to your normal activities as told by your health care provider. Ask your health care provider what activities are safe for you.  Do not lift anything that is heavier than 10 lb (4.5 kg). Follow this limit for 1 week after your procedure, or for as long as told by your health care provider. General instructions  Watch for any blood in your urine. Call your health care provider if the amount of blood in your urine increases.  If you have a catheter: ? Follow instructions from your health care provider about taking care of your catheter and collection bag. ? Do not take baths, swim, or use a hot tub until your health care provider approves.  Drink enough fluid to keep your urine clear or pale yellow.  Keep all follow-up visits as told by your health care provider. This is important. Contact a health care provider if:  You have pain that gets worse or does not get better with medicine, especially pain when you urinate.  You have difficulty urinating.  You feel nauseous or you vomit repeatedly during a period of more than 2 days after the procedure. Get help right away if:  Your urine is dark red or has blood clots in it.  You are leaking urine (have incontinence).  The end of the stent comes out of your urethra.  You cannot urinate.  You have sudden, sharp, or severe pain in  your abdomen or lower back.  You have a fever. This information is not intended to replace advice given to you by your health care provider. Make sure you discuss any questions you have with your health care provider. Document Released: 05/24/2013 Document Revised: 02/27/2016 Document Reviewed: 04/05/2015 Elsevier Interactive Patient Education  2019 [employer].

## 2018-12-12 NOTE — Anesthesia Procedure Notes (Signed)
Procedure Name: LMA Insertion Date/Time: 12/12/2018 12:31 PM Performed by: Gwyndolyn Saxon, CRNA Pre-anesthesia Checklist: Patient identified, Emergency Drugs available, Suction available and Patient being monitored Patient Re-evaluated:Patient Re-evaluated prior to induction Oxygen Delivery Method: Circle system utilized Preoxygenation: Pre-oxygenation with 100% oxygen Induction Type: IV induction Ventilation: Mask ventilation without difficulty LMA: LMA inserted LMA Size: 4.0 Number of attempts: 1 Placement Confirmation: positive ETCO2 and breath sounds checked- equal and bilateral Tube secured with: Tape Dental Injury: Teeth and Oropharynx as per pre-operative assessment

## 2018-12-12 NOTE — Op Note (Signed)
Preoperative diagnosis: Right ureteral stone  Postoperative diagnosis: Same  Procedure: 1 cystoscopy 2 right retrograde pyelography 3.  Intraoperative fluoroscopy, under one hour, with interpretation 4.  Right ureteroscopic stone manipulation with laser lithotripsy 5.  Right 6 x 24 JJ stent exchange  Attending: Rosie Fate  Anesthesia: General  Estimated blood loss: None  Drains: Right 6 x 24 JJ ureteral stent without tether  Specimens: stone for analysis  Antibiotics: rocephin  Findings: right mid ureteral calculus. Severe ureteral edema at the level of the calculus. Mild hydronephrosis.  Indications: Patient is a 58 year old female with a history of ureteral stone and and sepsis who underwent stent placement.  After discussing treatment options, she decided proceed with right ureteroscopic stone manipulation.  Procedure her in detail: The patient was brought to the operating room and a brief timeout was done to ensure correct patient, correct procedure, correct site.  General anesthesia was administered patient was placed in dorsal lithotomy position.  Her genitalia was then prepped and draped in usual sterile fashion.  A rigid 39 French cystoscope was passed in the urethra and the bladder.  Bladder was inspected free masses or lesions.  the right ureteral orifices were in the normal orthotopic locations. Using a grasper the ureteral stent was brought to the urethral meatus. A zipwire was then advanced through the stent and up to the renal pelvis. The stent was then removed. a 6 french ureteral catheter was then instilled into the right ureter orifice.  a gentle retrograde was obtained and findings noted above.   we then removed the cystoscope and cannulated the right ureteral orifice with a semirigid ureteroscope. We were unable to advance the semirigid to the stone and we elected to advance a sensor wire up to the renal pelvis and remove the scope. We then advanced a flexible  ureteroscope over the sensor wire and up to the calculus.   we then encountered the stone in the mid ureter.  using a 200 nm laser fiber and fragmented the stone into smaller pieces.  the pieces were then removed with a Ngage basket. once all stone fragments were removed we then placed a 6 x 26 double-j ureteral stent over the original zip wire.  We then removed the wire and good coil was noted in the the renal pelvis under fluoroscopy and the bladder under direct vision.  the bladder was then drained and this concluded the procedure which was well tolerated by patient.  Complications: None  Condition: Stable, extubated, transferred to PACU  Plan: Patient is to be discharged home as to follow-up in one week for stent removal.

## 2018-12-12 NOTE — Anesthesia Preprocedure Evaluation (Signed)
Anesthesia Evaluation  Patient identified by MRN, date of birth, ID band Patient awake    Reviewed: Allergy & Precautions, NPO status , Patient's Chart, lab work & pertinent test results  History of Anesthesia Complications Negative for: history of anesthetic complications  Airway Mallampati: II  TM Distance: >3 FB Neck ROM: Full    Dental no notable dental hx. (+) Dental Advisory Given   Pulmonary former smoker,    Pulmonary exam normal        Cardiovascular negative cardio ROS Normal cardiovascular exam     Neuro/Psych  Headaches, PSYCHIATRIC DISORDERS Anxiety Depression    GI/Hepatic Neg liver ROS, rectal cancer   Endo/Other  negative endocrine ROS  Renal/GU   negative genitourinary   Musculoskeletal  (+) Arthritis ,   Abdominal   Peds negative pediatric ROS (+)  Hematology  (+) anemia ,   Anesthesia Other Findings   Reproductive/Obstetrics                             Anesthesia Physical  Anesthesia Plan  ASA: II  Anesthesia Plan: General   Post-op Pain Management:    Induction: Intravenous  PONV Risk Score and Plan: 3 and Dexamethasone, Ondansetron and Diphenhydramine  Airway Management Planned: LMA  Additional Equipment: None  Intra-op Plan:   Post-operative Plan: Extubation in OR  Informed Consent: I have reviewed the patients History and Physical, chart, labs and discussed the procedure including the risks, benefits and alternatives for the proposed anesthesia with the patient or authorized representative who has indicated his/her understanding and acceptance.     Dental advisory given  Plan Discussed with: CRNA and Anesthesiologist  Anesthesia Plan Comments:         Anesthesia Quick Evaluation

## 2018-12-12 NOTE — Transfer of Care (Signed)
Immediate Anesthesia Transfer of Care Note  Patient: Norma Colon  Procedure(s) Performed: CYSTOSCOPY WITH RETROGRADE PYELOGRAM, URETEROSCOPY, STONE EXTRACTION AND STENT PLACEMENT (Right Ureter)  Patient Location: PACU  Anesthesia Type:General  Level of Consciousness: drowsy and patient cooperative  Airway & Oxygen Therapy: Patient Spontanous Breathing and Patient connected to face mask oxygen  Post-op Assessment: Report given to RN and Post -op Vital signs reviewed and stable  Post vital signs: Reviewed and stable  Last Vitals:  Vitals Value Taken Time  BP 114/63 12/12/2018  1:12 PM  Temp    Pulse 97 12/12/2018  1:13 PM  Resp 13 12/12/2018  1:13 PM  SpO2 100 % 12/12/2018  1:13 PM  Vitals shown include unvalidated device data.  Last Pain:  Vitals:   12/12/18 1101  TempSrc:   PainSc: 1       Patients Stated Pain Goal: 8 (92/11/94 1740)  Complications: No apparent anesthesia complications

## 2018-12-12 NOTE — Interval H&P Note (Signed)
History and Physical Interval Note:  12/12/2018 12:13 PM  Norma Colon  has presented today for surgery, with the diagnosis of RIGHT URETERAL CALCULUS.  The various methods of treatment have been discussed with the patient and family. After consideration of risks, benefits and other options for treatment, the patient has consented to  Procedure(s) with comments: CYSTOSCOPY WITH RETROGRADE PYELOGRAM, URETEROSCOPY AND STENT PLACEMENT (Right) - 30 MINS HOLMIUM LASER APPLICATION (Right) as a surgical intervention.  The patient's history has been reviewed, patient examined, no change in status, stable for surgery.  I have reviewed the patient's chart and labs.  Questions were answered to the patient's satisfaction.     Nicolette Bang

## 2018-12-12 NOTE — Anesthesia Postprocedure Evaluation (Signed)
Anesthesia Post Note  Patient: Norma Colon  Procedure(s) Performed: CYSTOSCOPY WITH RETROGRADE PYELOGRAM, URETEROSCOPY, STONE EXTRACTION AND STENT PLACEMENT (Right Ureter)     Patient location during evaluation: PACU Anesthesia Type: General Level of consciousness: awake and alert Pain management: pain level controlled Vital Signs Assessment: post-procedure vital signs reviewed and stable Respiratory status: spontaneous breathing, nonlabored ventilation and respiratory function stable Cardiovascular status: blood pressure returned to baseline and stable Postop Assessment: no apparent nausea or vomiting Anesthetic complications: no    Last Vitals:  Vitals:   12/12/18 1340 12/12/18 1420  BP: 107/78 125/75  Pulse: 84 80  Resp: 14 14  Temp:  36.6 C  SpO2: 99% 100%    Last Pain:  Vitals:   12/12/18 1400  TempSrc:   PainSc: 2                  Lidia Collum

## 2018-12-13 ENCOUNTER — Encounter (HOSPITAL_BASED_OUTPATIENT_CLINIC_OR_DEPARTMENT_OTHER): Payer: Self-pay | Admitting: Urology

## 2018-12-18 ENCOUNTER — Other Ambulatory Visit: Payer: Self-pay | Admitting: Oncology

## 2018-12-19 ENCOUNTER — Encounter: Payer: Self-pay | Admitting: *Deleted

## 2018-12-19 ENCOUNTER — Inpatient Hospital Stay (HOSPITAL_BASED_OUTPATIENT_CLINIC_OR_DEPARTMENT_OTHER): Payer: 59 | Admitting: Oncology

## 2018-12-19 ENCOUNTER — Inpatient Hospital Stay: Payer: 59

## 2018-12-19 ENCOUNTER — Inpatient Hospital Stay: Payer: 59 | Attending: Nurse Practitioner

## 2018-12-19 ENCOUNTER — Other Ambulatory Visit: Payer: Self-pay

## 2018-12-19 VITALS — BP 105/62 | HR 103 | Temp 98.3°F | Resp 19 | Ht 69.0 in | Wt 184.1 lb

## 2018-12-19 VITALS — HR 99

## 2018-12-19 DIAGNOSIS — G2581 Restless legs syndrome: Secondary | ICD-10-CM | POA: Diagnosis not present

## 2018-12-19 DIAGNOSIS — G629 Polyneuropathy, unspecified: Secondary | ICD-10-CM | POA: Insufficient documentation

## 2018-12-19 DIAGNOSIS — Z5111 Encounter for antineoplastic chemotherapy: Secondary | ICD-10-CM | POA: Diagnosis present

## 2018-12-19 DIAGNOSIS — N132 Hydronephrosis with renal and ureteral calculous obstruction: Secondary | ICD-10-CM | POA: Diagnosis not present

## 2018-12-19 DIAGNOSIS — C20 Malignant neoplasm of rectum: Secondary | ICD-10-CM | POA: Insufficient documentation

## 2018-12-19 DIAGNOSIS — G43909 Migraine, unspecified, not intractable, without status migrainosus: Secondary | ICD-10-CM | POA: Diagnosis not present

## 2018-12-19 DIAGNOSIS — R3915 Urgency of urination: Secondary | ICD-10-CM | POA: Diagnosis not present

## 2018-12-19 LAB — CMP (CANCER CENTER ONLY)
ALT: 20 U/L (ref 0–44)
AST: 25 U/L (ref 15–41)
Albumin: 3.3 g/dL — ABNORMAL LOW (ref 3.5–5.0)
Alkaline Phosphatase: 107 U/L (ref 38–126)
Anion gap: 12 (ref 5–15)
BUN: 13 mg/dL (ref 6–20)
CHLORIDE: 108 mmol/L (ref 98–111)
CO2: 21 mmol/L — ABNORMAL LOW (ref 22–32)
Calcium: 9.4 mg/dL (ref 8.9–10.3)
Creatinine: 1.03 mg/dL — ABNORMAL HIGH (ref 0.44–1.00)
GFR, Est AFR Am: >60 mL/min (ref >60)
GFR, Estimated: >60 mL/min (ref >60)
Glucose, Bld: 122 mg/dL — ABNORMAL HIGH (ref 70–99)
Potassium: 3.8 mmol/L (ref 3.5–5.1)
Sodium: 141 mmol/L (ref 135–145)
Total Bilirubin: 0.7 mg/dL (ref 0.3–1.2)
Total Protein: 7 g/dL (ref 6.5–8.1)

## 2018-12-19 LAB — CBC WITH DIFFERENTIAL (CANCER CENTER ONLY)
Abs Immature Granulocytes: 0.03 10*3/uL (ref 0.00–0.07)
BASOS ABS: 0 10*3/uL (ref 0.0–0.1)
Basophils Relative: 1 %
Eosinophils Absolute: 0.1 10*3/uL (ref 0.0–0.5)
Eosinophils Relative: 2 %
HCT: 32.6 % — ABNORMAL LOW (ref 36.0–46.0)
Hemoglobin: 10.8 g/dL — ABNORMAL LOW (ref 12.0–15.0)
Immature Granulocytes: 1 %
Lymphocytes Relative: 15 %
Lymphs Abs: 0.5 10*3/uL — ABNORMAL LOW (ref 0.7–4.0)
MCH: 32.9 pg (ref 26.0–34.0)
MCHC: 33.1 g/dL (ref 30.0–36.0)
MCV: 99.4 fL (ref 80.0–100.0)
Monocytes Absolute: 0.6 10*3/uL (ref 0.1–1.0)
Monocytes Relative: 19 %
Neutro Abs: 1.9 10*3/uL (ref 1.7–7.7)
Neutrophils Relative %: 62 %
Platelet Count: 137 10*3/uL — ABNORMAL LOW (ref 150–400)
RBC: 3.28 MIL/uL — ABNORMAL LOW (ref 3.87–5.11)
RDW: 19.9 % — ABNORMAL HIGH (ref 11.5–15.5)
WBC Count: 3.1 10*3/uL — ABNORMAL LOW (ref 4.0–10.5)
nRBC: 0 % (ref 0.0–0.2)

## 2018-12-19 MED ORDER — OXALIPLATIN CHEMO INJECTION 100 MG/20ML
100.0000 mg/m2 | Freq: Once | INTRAVENOUS | Status: AC
  Administered 2018-12-19: 200 mg via INTRAVENOUS
  Filled 2018-12-19: qty 40

## 2018-12-19 MED ORDER — SODIUM CHLORIDE 0.9 % IV SOLN
Freq: Once | INTRAVENOUS | Status: AC
  Administered 2018-12-19: 12:00:00 via INTRAVENOUS
  Filled 2018-12-19: qty 5

## 2018-12-19 MED ORDER — PALONOSETRON HCL INJECTION 0.25 MG/5ML
0.2500 mg | Freq: Once | INTRAVENOUS | Status: AC
  Administered 2018-12-19: 0.25 mg via INTRAVENOUS

## 2018-12-19 MED ORDER — SODIUM CHLORIDE 0.9% FLUSH
10.0000 mL | INTRAVENOUS | Status: DC | PRN
  Filled 2018-12-19: qty 10

## 2018-12-19 MED ORDER — PALONOSETRON HCL INJECTION 0.25 MG/5ML
INTRAVENOUS | Status: AC
  Filled 2018-12-19: qty 5

## 2018-12-19 MED ORDER — DEXTROSE 5 % IV SOLN
Freq: Once | INTRAVENOUS | Status: AC
  Administered 2018-12-19: 12:00:00 via INTRAVENOUS
  Filled 2018-12-19: qty 250

## 2018-12-19 NOTE — Research (Signed)
[place] STUDY; Met with patient in infusion room to check on her status for the 6 month time point since enrolling on study. Patient will complete chemotherapy after this cycle with curative intent and no further treatment planned at this time for her stage 3 rectal cancer.She states she is feeling well. She is able to perform all of her ADLs and instrumental ADLs by herself.Patient is working full time.RN assessed ECOG level as 1and Dr. Benay Spice agrees. Patientdenies any changes to her insurance since beginning on study. Informed patient that the study has emailed her to complete questionnaire.  Asked patient to check her email and let this research know if she has not received any emails.  She agreeed.  Thanked patient for her ongoing participation in this study. I will contact her again in about 6 months for the 12 month follow up form. Encouraged patient to contact research nurse if any questions before next contact. She verbalized understanding.  Foye Spurling, BSN, RN Clinical Research Nurse 12/19/2018 1:21 PM

## 2018-12-19 NOTE — Progress Notes (Signed)
[facility] OFFICE PROGRESS NOTE   Diagnosis: Rectal cancer  INTERVAL HISTORY:   Ms. Berndt returns as scheduled.  She completed another cycle of CAPOX beginning 11/28/2018.  No nausea, mouth sores, diarrhea, or hand/foot pain.  She has intermittent cold sensitivity and tingling in the extremities.  No numbness.  This does not interfere with activities. She underwent a cystoscopy and laser lithotripsy for removal of the right ureter stone on 12/12/2018.  A right ureter stent was left in place.  She is scheduled to have the stent removed on 12/22/2018.  She reports urinary urgency.  Objective:  Vital signs in last 24 hours:  Blood pressure 105/62, pulse (!) 103, temperature 98.3 F (36.8 C), temperature source Oral, resp. rate 19, height _0  (1.753 m), weight 184 lb 1.6 oz (83.5 kg), SpO2 100 %.    HEENT: No thrush or ulcers Resp: Lungs clear bilaterally Cardio: Regular rate and rhythm GI: No hepatomegaly, nontender Vascular: No leg edema Neuro: Very mild loss of vibratory sense at the left fingertips, normal on the right side Skin: Palms without erythema   Lab Results:  Lab Results  Component Value Date   WBC 3.1 (L) 12/19/2018   HGB 10.8 (L) 12/19/2018   HCT 32.6 (L) 12/19/2018   MCV 99.4 12/19/2018   PLT 137 (L) 12/19/2018   NEUTROABS 1.9 12/19/2018    CMP  Lab Results  Component Value Date   NA 141 12/19/2018   K 3.8 12/19/2018   CL 108 12/19/2018   CO2 21 (L) 12/19/2018   GLUCOSE 122 (H) 12/19/2018   BUN 13 12/19/2018   CREATININE 1.03 (H) 12/19/2018   CALCIUM 9.4 12/19/2018   PROT 7.0 12/19/2018   ALBUMIN 3.3 (L) 12/19/2018   AST 25 12/19/2018   ALT 20 12/19/2018   ALKPHOS 107 12/19/2018   BILITOT 0.7 12/19/2018   GFRNONAA >60 12/19/2018   GFRAA >60 12/19/2018   eviewed the patient's current medications.   Assessment/Plan: 1. Rectal cancer ? Colonoscopy 05/17/2018 revealed a partially obstructing mass beginning at 10 cm from the  anal verge, biopsy confirmed invasive moderately differentiated adenocarcinoma ? CTs 05/24/2018-mass beginning at proximal he 7.6 cm from the anal verge, "20 "perirectal lymph nodes, no evidence of metastatic disease ? Pelvic MRI 06/01/2018-tumor measured at 10.8 cm from the anal sphincter, T3b, N1-2 left perirectal lymph nodes, each measuring 5 mm ? Radiation and Xeloda initiated 06/13/2018, completed 07/20/2018 ? Low anterior resection 09/16/2018,ypT3,ypN1c, 1 cm tumor, partial response-score 2, 0/13 lymph nodes positive, 2 tumor deposits, MSI-stable, no loss of mismatch repair protein expression ? Cycle 1 CAPOX 10/17/2018 ? Cycle 2 CAPOX 11/07/2018 (oxaliplatin dose reduced due to neutropenia) ? Cycle 3 CAPOX 11/28/2018 ? Cycle 4 CAPOX 12/19/2018 2. Tobacco use 3. Right UPJ stone on CT 05/24/2018  CT 11/21/2018-migration of previously noted right UPJ stone, new marked right hydroureteronephrosis  Placement of a right JJ stent 11/21/2018  Laser lithotripsy and replacement of right JJ stent 12/12/2018 4. Migraine headaches 5. Restless legs 6. Arm pain during the oxaliplatin infusion and for 1 week after cycle 1.  The oxaliplatin will be further diluted and infusion time extended getting with cycle 2. 7. Neutropenia secondary to chemotherapy.  Oxaliplatin dose reduced beginning with cycle 2. 8. Diarrhea following cycle 2 CAPOX- Xeloda dose reduced with cycle 3      Disposition: Norma Colon appears well.  She tolerated the last cycle of CAPOX well.  She has minimal neuropathy symptoms.  The plan is to  proceed with cycle 4 today.  She will return for an office and lab visit in 1 month.  She will contact us in the interim for new symptoms.  Ms. Jacot will undergo removal of the right ureter stent later this week.  Betsy Coder, MD  12/19/2018  11:04 AM

## 2018-12-19 NOTE — Patient Instructions (Signed)
[facility] Discharge Instructions for Patients Receiving Chemotherapy  Today you received the following chemotherapy agents: Oxaliplatin  To help prevent nausea and vomiting after your treatment, we encourage you to take your nausea medication as directed.   If you develop nausea and vomiting that is not controlled by your nausea medication, call the clinic.   BELOW ARE SYMPTOMS THAT SHOULD BE REPORTED IMMEDIATELY:  *FEVER GREATER THAN 100.5 F  *CHILLS WITH OR WITHOUT FEVER  NAUSEA AND VOMITING THAT IS NOT CONTROLLED WITH YOUR NAUSEA MEDICATION  *UNUSUAL SHORTNESS OF BREATH  *UNUSUAL BRUISING OR BLEEDING  TENDERNESS IN MOUTH AND THROAT WITH OR WITHOUT PRESENCE OF ULCERS  *URINARY PROBLEMS  *BOWEL PROBLEMS  UNUSUAL RASH Items with * indicate a potential emergency and should be followed up as soon as possible.  Feel free to call the clinic should you have any questions or concerns. The clinic phone number is [phone].  Please show the CHEMO ALERT CARD at check-in to the Emergency Department and triage nurse.   

## 2018-12-20 ENCOUNTER — Telehealth: Payer: Self-pay | Admitting: *Deleted

## 2018-12-20 NOTE — Telephone Encounter (Signed)
Patient left VM requesting return call "asap" regarding prescriptions. Called patient back and got her voice mail. Left message that return call was attempted.

## 2018-12-21 ENCOUNTER — Telehealth: Payer: Self-pay | Admitting: Oncology

## 2018-12-21 NOTE — Telephone Encounter (Signed)
Scheduled appts per 03/16 los for 04/14. Patient was left a vm and will be mailed an updated schedule.

## 2018-12-26 ENCOUNTER — Other Ambulatory Visit: Payer: Self-pay | Admitting: Oncology

## 2019-01-02 ENCOUNTER — Other Ambulatory Visit: Payer: Self-pay | Admitting: Oncology

## 2019-01-17 ENCOUNTER — Inpatient Hospital Stay: Payer: 59 | Admitting: Nurse Practitioner

## 2019-01-17 ENCOUNTER — Inpatient Hospital Stay: Payer: 59

## 2019-01-17 ENCOUNTER — Telehealth: Payer: Self-pay | Admitting: Oncology

## 2019-01-17 NOTE — Telephone Encounter (Signed)
R/s appt per 4/13 sch message - unable to reach patient . Left message with apt date and time and mailed letter

## 2019-02-14 ENCOUNTER — Inpatient Hospital Stay: Payer: 59

## 2019-02-14 ENCOUNTER — Inpatient Hospital Stay: Payer: 59 | Admitting: Nurse Practitioner

## 2019-02-14 ENCOUNTER — Telehealth: Payer: Self-pay | Admitting: *Deleted

## 2019-02-14 ENCOUNTER — Other Ambulatory Visit: Payer: Self-pay | Admitting: *Deleted

## 2019-02-14 NOTE — Progress Notes (Unsigned)
Patient called to cancel her lab/OV today. Has "sniffles" and does not want to come in.

## 2019-02-14 NOTE — Telephone Encounter (Signed)
Does not want to come in today due to COVID 19. Per Dr. Benay Spice, can reschedule for 6 weeks. Patient agrees. Scheduling message sent/

## 2019-02-16 ENCOUNTER — Telehealth: Payer: Self-pay | Admitting: Oncology

## 2019-02-16 NOTE — Telephone Encounter (Signed)
Scheduled appt per sch msg. Mailed printout  °

## 2019-02-21 ENCOUNTER — Telehealth: Payer: Self-pay | Admitting: *Deleted

## 2019-02-21 NOTE — Telephone Encounter (Addendum)
Patient left VM requesting return call. She has questions. Attempted to return call at 1344: Left VM that RN will attempt again later today. Called her back and she reports going to the beach 3 weeks ago and got significant sunburn, which is better. Going back on Thursday and asking if her skin is still more sensitive to the sun due to chemo? Also asking what she should do to protect herself. Vomited on two occasions over the past two weeks after eating the same thing her boyfriend ate and he did not get sick. No diarrhea, bowels move great now. No fever. Asking if this is still due to chemo. Informed her that side effects from RT/chemo should be out of her system now. Wear strong sunscreen and apply frequently, sit under umbrella and wear a hat when not under the umbrella.

## 2019-03-10 ENCOUNTER — Other Ambulatory Visit: Payer: Self-pay | Admitting: Urology

## 2019-03-30 ENCOUNTER — Other Ambulatory Visit: Payer: Self-pay

## 2019-03-30 ENCOUNTER — Inpatient Hospital Stay: Payer: 59 | Attending: Nurse Practitioner | Admitting: Nurse Practitioner

## 2019-03-30 ENCOUNTER — Inpatient Hospital Stay: Payer: 59

## 2019-03-30 ENCOUNTER — Encounter: Payer: Self-pay | Admitting: Nurse Practitioner

## 2019-03-30 VITALS — BP 131/91 | HR 101 | Temp 99.1°F | Resp 18 | Ht 69.0 in | Wt 203.9 lb

## 2019-03-30 DIAGNOSIS — N1330 Unspecified hydronephrosis: Secondary | ICD-10-CM | POA: Insufficient documentation

## 2019-03-30 DIAGNOSIS — C20 Malignant neoplasm of rectum: Secondary | ICD-10-CM

## 2019-03-30 DIAGNOSIS — Z9221 Personal history of antineoplastic chemotherapy: Secondary | ICD-10-CM | POA: Diagnosis not present

## 2019-03-30 DIAGNOSIS — G43909 Migraine, unspecified, not intractable, without status migrainosus: Secondary | ICD-10-CM | POA: Diagnosis not present

## 2019-03-30 DIAGNOSIS — G2581 Restless legs syndrome: Secondary | ICD-10-CM | POA: Insufficient documentation

## 2019-03-30 DIAGNOSIS — Z87891 Personal history of nicotine dependence: Secondary | ICD-10-CM | POA: Insufficient documentation

## 2019-03-30 LAB — CBC WITH DIFFERENTIAL (CANCER CENTER ONLY)
Abs Immature Granulocytes: 0.01 10*3/uL (ref 0.00–0.07)
Basophils Absolute: 0 10*3/uL (ref 0.0–0.1)
Basophils Relative: 1 %
Eosinophils Absolute: 0.2 10*3/uL (ref 0.0–0.5)
Eosinophils Relative: 4 %
HCT: 39.3 % (ref 36.0–46.0)
Hemoglobin: 12.9 g/dL (ref 12.0–15.0)
Immature Granulocytes: 0 %
Lymphocytes Relative: 15 %
Lymphs Abs: 0.6 10*3/uL — ABNORMAL LOW (ref 0.7–4.0)
MCH: 31.7 pg (ref 26.0–34.0)
MCHC: 32.8 g/dL (ref 30.0–36.0)
MCV: 96.6 fL (ref 80.0–100.0)
Monocytes Absolute: 0.3 10*3/uL (ref 0.1–1.0)
Monocytes Relative: 7 %
Neutro Abs: 3.1 10*3/uL (ref 1.7–7.7)
Neutrophils Relative %: 73 %
Platelet Count: 193 10*3/uL (ref 150–400)
RBC: 4.07 MIL/uL (ref 3.87–5.11)
RDW: 11.8 % (ref 11.5–15.5)
WBC Count: 4.2 10*3/uL (ref 4.0–10.5)
nRBC: 0 % (ref 0.0–0.2)

## 2019-03-30 LAB — CEA (IN HOUSE-CHCC): CEA (CHCC-In House): 1.8 ng/mL (ref 0.00–5.00)

## 2019-03-30 LAB — CMP (CANCER CENTER ONLY)
ALT: 29 U/L (ref 0–44)
AST: 27 U/L (ref 15–41)
Albumin: 3.6 g/dL (ref 3.5–5.0)
Alkaline Phosphatase: 114 U/L (ref 38–126)
Anion gap: 9 (ref 5–15)
BUN: 14 mg/dL (ref 6–20)
CO2: 27 mmol/L (ref 22–32)
Calcium: 9.4 mg/dL (ref 8.9–10.3)
Chloride: 106 mmol/L (ref 98–111)
Creatinine: 1.16 mg/dL — ABNORMAL HIGH (ref 0.44–1.00)
GFR, Est AFR Am: >60 mL/min (ref >60)
GFR, Estimated: 53 mL/min — ABNORMAL LOW (ref >60)
Glucose, Bld: 115 mg/dL — ABNORMAL HIGH (ref 70–99)
Potassium: 3.9 mmol/L (ref 3.5–5.1)
Sodium: 142 mmol/L (ref 135–145)
Total Bilirubin: 0.4 mg/dL (ref 0.3–1.2)
Total Protein: 7.4 g/dL (ref 6.5–8.1)

## 2019-03-30 NOTE — Progress Notes (Addendum)
[facility] OFFICE PROGRESS NOTE   Diagnosis: Rectal cancer  INTERVAL HISTORY:   Norma Colon returns for follow-up.  She completed the fourth and final cycle of CAPOX 12/19/2018.  She has had 3 episodes of nausea/vomiting over the past 2 months.  Bowels moving regularly.  No rectal bleeding.  She has occasional abdominal pain.  She is unable to lay on the right side due to discomfort.  She thinks the discomfort is related to the "kidney".  She continues follow-up with Dr. Alyson Ingles and reports she is scheduled for surgery in July.  She notes ankle edema at nighttime.  She reports "restless legs".  She is periodically dizzy.  She reports she quit smoking 09/16/2018.  Objective:  Vital signs in last 24 hours:  Blood pressure (!) 131/91, pulse (!) 101, temperature 99.1 F (37.3 C), temperature source Oral, resp. rate 18, height _0  (1.753 m), weight 203 lb 14.4 oz (92.5 kg), SpO2 100 %.    HEENT: Neck without mass. Lymphatics: No palpable cervical, supraclavicular, axillary or inguinal lymph nodes. GI: Abdomen soft and nontender.  No hepatomegaly.  No mass. Vascular: No leg edema.    Lab Results:  Lab Results  Component Value Date   WBC 4.2 03/30/2019   HGB 12.9 03/30/2019   HCT 39.3 03/30/2019   MCV 96.6 03/30/2019   PLT 193 03/30/2019   NEUTROABS 3.1 03/30/2019    Imaging:  No results found.  Medications: I have reviewed the patient's current medications.  Assessment/Plan: 1. Rectal cancer ? 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 CTs 05/24/2018-mass beginning at proximal he 7.6 cm from the anal verge, "20 "perirectal lymph nodes, no evidence of metastatic disease ? Pelvic MRI 06/01/2018-tumor measured at 10.8 cm from the anal sphincter, T3b, N1-2 left perirectal lymph nodes, each measuring 5 mm ? Radiation and Xeloda initiated  06/13/2018, completed 07/20/2018 ? Low anterior resection 09/16/2018,ypT3,ypN1c, 1 cm tumor, partial response-score 2, 0/13 lymph nodes positive, 2 tumor deposits, MSI-stable, no loss of mismatch repair protein expression ? Cycle 1 CAPOX 10/17/2018 ? Cycle 2 CAPOX 11/07/2018 (oxaliplatin dose reduced due to neutropenia) ? Cycle 3 CAPOX 11/28/2018 ? Cycle 4 CAPOX 12/19/2018 2. Tobacco use 3. 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 Alyson Ingles, urology 4. Migraine headaches 5. Restless legs 6. Arm pain during the oxaliplatin infusion and for 1 week after cycle 1.  The oxaliplatin will be further diluted and infusion time extended getting with cycle 2. 7. Neutropenia secondary to chemotherapy.  Oxaliplatin dose reduced beginning with cycle 2. 8. Diarrhea following cycle 2 CAPOX- Xeloda dose reduced with cycle 3  Disposition: Norma Colon is in clinical remission from rectal cancer.  We will follow-up on the CEA from today.  We referred her to gastroenterology to schedule the 1 year colonoscopy.  She will undergo surveillance CT scans in approximately 2 months.  We will see her 1 week after CTs to review the results.  Patient seen with Dr. Benay Spice.  Bethenny Losee ANP/GNP-BC   03/30/2019  10:42 AM  This was a shared visit with Ned Card.  Norma Colon is in remission from rectal cancer.  She will return for an office visit and restaging CTs in approximately 2  months.  She will continue follow-up with Dr. Alyson Ingles for management of the right hydronephrosis.  Julieanne Manson, MD

## 2019-03-31 ENCOUNTER — Telehealth: Payer: Self-pay | Admitting: Nurse Practitioner

## 2019-03-31 NOTE — Telephone Encounter (Signed)
Scheduled per los. Mailed printout  °

## 2019-04-13 ENCOUNTER — Other Ambulatory Visit: Payer: Self-pay

## 2019-04-13 ENCOUNTER — Encounter (HOSPITAL_BASED_OUTPATIENT_CLINIC_OR_DEPARTMENT_OTHER): Payer: Self-pay | Admitting: *Deleted

## 2019-04-13 ENCOUNTER — Other Ambulatory Visit (HOSPITAL_COMMUNITY)
Admission: RE | Admit: 2019-04-13 | Discharge: 2019-04-13 | Disposition: A | Discharge status: Home / Self Care | Payer: 59 | Source: Ambulatory Visit | Attending: Urology | Admitting: Urology

## 2019-04-13 DIAGNOSIS — Z1159 Encounter for screening for other viral diseases: Secondary | ICD-10-CM | POA: Diagnosis not present

## 2019-04-13 DIAGNOSIS — Z01812 Encounter for preprocedural laboratory examination: Secondary | ICD-10-CM | POA: Diagnosis present

## 2019-04-13 NOTE — Progress Notes (Signed)

## 2019-04-13 NOTE — Progress Notes (Signed)
Spoke w/ pt via phone for pre-op interview.  Npo after mn w/ exception clear liquids until 0730 then nothing by mouth.  Arrive at 1145.  Pt had covid test done today.  Will take xanax and wellbutrin am dos w/ sips of water.

## 2019-04-14 LAB — SARS CORONAVIRUS 2 (TAT 6-24 HRS): SARS Coronavirus 2: NEGATIVE

## 2019-04-17 ENCOUNTER — Ambulatory Visit (HOSPITAL_BASED_OUTPATIENT_CLINIC_OR_DEPARTMENT_OTHER): Payer: 59 | Admitting: Certified Registered"

## 2019-04-17 ENCOUNTER — Other Ambulatory Visit: Payer: Self-pay

## 2019-04-17 ENCOUNTER — Ambulatory Visit (HOSPITAL_BASED_OUTPATIENT_CLINIC_OR_DEPARTMENT_OTHER)
Admission: RE | Admit: 2019-04-17 | Discharge: 2019-04-17 | Disposition: A | Discharge status: Home / Self Care | Payer: 59 | Attending: Urology | Admitting: Urology

## 2019-04-17 ENCOUNTER — Encounter (HOSPITAL_BASED_OUTPATIENT_CLINIC_OR_DEPARTMENT_OTHER)
Admission: RE | Disposition: A | Discharge status: Home / Self Care | Payer: Self-pay | Source: Home / Self Care | Attending: Urology

## 2019-04-17 ENCOUNTER — Encounter (HOSPITAL_BASED_OUTPATIENT_CLINIC_OR_DEPARTMENT_OTHER): Payer: Self-pay | Admitting: *Deleted

## 2019-04-17 DIAGNOSIS — C20 Malignant neoplasm of rectum: Secondary | ICD-10-CM | POA: Insufficient documentation

## 2019-04-17 DIAGNOSIS — M1380 Other specified arthritis, unspecified site: Secondary | ICD-10-CM | POA: Insufficient documentation

## 2019-04-17 DIAGNOSIS — Z9221 Personal history of antineoplastic chemotherapy: Secondary | ICD-10-CM | POA: Insufficient documentation

## 2019-04-17 DIAGNOSIS — Z87891 Personal history of nicotine dependence: Secondary | ICD-10-CM | POA: Insufficient documentation

## 2019-04-17 DIAGNOSIS — N1330 Unspecified hydronephrosis: Secondary | ICD-10-CM | POA: Diagnosis present

## 2019-04-17 DIAGNOSIS — Z87442 Personal history of urinary calculi: Secondary | ICD-10-CM | POA: Insufficient documentation

## 2019-04-17 DIAGNOSIS — M419 Scoliosis, unspecified: Secondary | ICD-10-CM | POA: Insufficient documentation

## 2019-04-17 HISTORY — DX: Personal history of urinary calculi: Z87.442

## 2019-04-17 HISTORY — DX: Agranulocytosis secondary to cancer chemotherapy: D70.1

## 2019-04-17 HISTORY — DX: Malignant neoplasm of rectum: C20

## 2019-04-17 HISTORY — DX: Personal history of other infectious and parasitic diseases: Z86.19

## 2019-04-17 HISTORY — PX: CYSTOSCOPY WITH RETROGRADE PYELOGRAM, URETEROSCOPY AND STENT PLACEMENT: SHX5789

## 2019-04-17 HISTORY — DX: Unspecified hydronephrosis: N13.30

## 2019-04-17 HISTORY — DX: Adverse effect of antineoplastic and immunosuppressive drugs, initial encounter: T45.1X5A

## 2019-04-17 SURGERY — CYSTOURETEROSCOPY, WITH RETROGRADE PYELOGRAM AND STENT INSERTION
Anesthesia: General | Site: Renal | Laterality: Right

## 2019-04-17 MED ORDER — OXYCODONE HCL 5 MG PO TABS
5.0000 mg | ORAL_TABLET | Freq: Once | ORAL | Status: DC | PRN
  Filled 2019-04-17: qty 1

## 2019-04-17 MED ORDER — KETOROLAC TROMETHAMINE 30 MG/ML IJ SOLN
INTRAMUSCULAR | Status: DC | PRN
  Administered 2019-04-17: 30 mg via INTRAVENOUS

## 2019-04-17 MED ORDER — PROMETHAZINE HCL 25 MG/ML IJ SOLN
6.2500 mg | INTRAMUSCULAR | Status: DC | PRN
  Filled 2019-04-17: qty 1

## 2019-04-17 MED ORDER — DEXAMETHASONE SODIUM PHOSPHATE 10 MG/ML IJ SOLN
INTRAMUSCULAR | Status: DC | PRN
  Administered 2019-04-17: 10 mg via INTRAVENOUS

## 2019-04-17 MED ORDER — FENTANYL CITRATE (PF) 100 MCG/2ML IJ SOLN
INTRAMUSCULAR | Status: AC
  Filled 2019-04-17: qty 2

## 2019-04-17 MED ORDER — LIDOCAINE 2% (20 MG/ML) 5 ML SYRINGE
INTRAMUSCULAR | Status: DC | PRN
  Administered 2019-04-17: 60 mg via INTRAVENOUS

## 2019-04-17 MED ORDER — ONDANSETRON HCL 4 MG/2ML IJ SOLN
INTRAMUSCULAR | Status: AC
  Filled 2019-04-17: qty 2

## 2019-04-17 MED ORDER — CEFAZOLIN SODIUM-DEXTROSE 2-4 GM/100ML-% IV SOLN
INTRAVENOUS | Status: AC
  Filled 2019-04-17: qty 100

## 2019-04-17 MED ORDER — MEPERIDINE HCL 25 MG/ML IJ SOLN
6.2500 mg | INTRAMUSCULAR | Status: DC | PRN
  Filled 2019-04-17: qty 1

## 2019-04-17 MED ORDER — HYDROCODONE-ACETAMINOPHEN 10-325 MG PO TABS: 1.0000 | ORAL_TABLET | ORAL | 0 refills | Status: DC | PRN

## 2019-04-17 MED ORDER — HYDROMORPHONE HCL 1 MG/ML IJ SOLN
0.2500 mg | INTRAMUSCULAR | Status: DC | PRN
  Filled 2019-04-17: qty 0.5

## 2019-04-17 MED ORDER — EPHEDRINE SULFATE-NACL 50-0.9 MG/10ML-% IV SOSY
PREFILLED_SYRINGE | INTRAVENOUS | Status: DC | PRN
  Administered 2019-04-17 (×2): 10 mg via INTRAVENOUS

## 2019-04-17 MED ORDER — OXYCODONE HCL 5 MG PO TABS
ORAL_TABLET | ORAL | Status: AC
  Filled 2019-04-17: qty 1

## 2019-04-17 MED ORDER — MIDAZOLAM HCL 2 MG/2ML IJ SOLN
INTRAMUSCULAR | Status: AC
  Filled 2019-04-17: qty 2

## 2019-04-17 MED ORDER — LACTATED RINGERS IV SOLN
INTRAVENOUS | Status: DC
  Administered 2019-04-17: 12:00:00 via INTRAVENOUS
  Filled 2019-04-17: qty 1000

## 2019-04-17 MED ORDER — FENTANYL CITRATE (PF) 100 MCG/2ML IJ SOLN
INTRAMUSCULAR | Status: DC | PRN
  Administered 2019-04-17 (×2): 50 ug via INTRAVENOUS

## 2019-04-17 MED ORDER — LIDOCAINE 2% (20 MG/ML) 5 ML SYRINGE
INTRAMUSCULAR | Status: AC
  Filled 2019-04-17: qty 5

## 2019-04-17 MED ORDER — ONDANSETRON HCL 4 MG/2ML IJ SOLN
INTRAMUSCULAR | Status: DC | PRN
  Administered 2019-04-17: 4 mg via INTRAVENOUS

## 2019-04-17 MED ORDER — KETOROLAC TROMETHAMINE 30 MG/ML IJ SOLN
INTRAMUSCULAR | Status: AC
  Filled 2019-04-17: qty 1

## 2019-04-17 MED ORDER — MIDAZOLAM HCL 2 MG/2ML IJ SOLN
INTRAMUSCULAR | Status: DC | PRN
  Administered 2019-04-17: 2 mg via INTRAVENOUS

## 2019-04-17 MED ORDER — EPHEDRINE 5 MG/ML INJ
INTRAVENOUS | Status: AC
  Filled 2019-04-17: qty 10

## 2019-04-17 MED ORDER — PROPOFOL 10 MG/ML IV BOLUS
INTRAVENOUS | Status: DC | PRN
  Administered 2019-04-17: 200 mg via INTRAVENOUS

## 2019-04-17 MED ORDER — PROPOFOL 10 MG/ML IV BOLUS
INTRAVENOUS | Status: AC
  Filled 2019-04-17: qty 20

## 2019-04-17 MED ORDER — CEFAZOLIN SODIUM-DEXTROSE 2-4 GM/100ML-% IV SOLN
2.0000 g | INTRAVENOUS | Status: AC
  Administered 2019-04-17: 14:00:00 2 g via INTRAVENOUS
  Filled 2019-04-17: qty 100

## 2019-04-17 MED ORDER — SODIUM CHLORIDE 0.9 % IR SOLN
Status: DC | PRN
  Administered 2019-04-17: 3000 mL

## 2019-04-17 MED ORDER — DEXAMETHASONE SODIUM PHOSPHATE 10 MG/ML IJ SOLN
INTRAMUSCULAR | Status: AC
  Filled 2019-04-17: qty 1

## 2019-04-17 MED ORDER — OXYCODONE HCL 5 MG/5ML PO SOLN
5.0000 mg | Freq: Once | ORAL | Status: DC | PRN
  Filled 2019-04-17: qty 5

## 2019-04-17 SURGICAL SUPPLY — 30 items
BAG DRAIN URO-CYSTO SKYTR STRL (DRAIN) ×3 IMPLANT
BASKET STONE 1.7 NGAGE (UROLOGICAL SUPPLIES) IMPLANT
CATH INTERMIT  6FR 70CM (CATHETERS) IMPLANT
CLOTH BEACON ORANGE TIMEOUT ST (SAFETY) ×3 IMPLANT
EXTRACTOR STONE 1.7FRX115CM (UROLOGICAL SUPPLIES) IMPLANT
FIBER LASER FLEXIVA 365 (UROLOGICAL SUPPLIES) IMPLANT
FIBER LASER TRAC TIP (UROLOGICAL SUPPLIES) IMPLANT
GLOVE BIO SURGEON STRL SZ8 (GLOVE) ×3 IMPLANT
GLOVE BIOGEL PI IND STRL 6.5 (GLOVE) ×2 IMPLANT
GLOVE BIOGEL PI INDICATOR 6.5 (GLOVE) ×4
GLOVE SURG SYN 6.5 ES PF (GLOVE) ×3 IMPLANT
GOWN STRL REUS W/TWL XL LVL3 (GOWN DISPOSABLE) ×6 IMPLANT
GUIDEWIRE ANG ZIPWIRE 038X150 (WIRE) ×3 IMPLANT
GUIDEWIRE STR DUAL SENSOR (WIRE) IMPLANT
IV NS 1000ML (IV SOLUTION)
IV NS 1000ML BAXH (IV SOLUTION) IMPLANT
IV NS IRRIG 3000ML ARTHROMATIC (IV SOLUTION) ×3 IMPLANT
KIT BALLN UROMAX 15FX4 (MISCELLANEOUS) ×1 IMPLANT
KIT BALLN UROMAX 26 75X4 (MISCELLANEOUS) ×2
KIT TURNOVER CYSTO (KITS) ×3 IMPLANT
MANIFOLD NEPTUNE II (INSTRUMENTS) ×3 IMPLANT
NS IRRIG 500ML POUR BTL (IV SOLUTION) IMPLANT
PACK CYSTO (CUSTOM PROCEDURE TRAY) ×3 IMPLANT
STENT CONTOUR 8FR X 24 (STENTS) ×3 IMPLANT
STENT URET 6FRX26 CONTOUR (STENTS) IMPLANT
SYR 10ML LL (SYRINGE) ×3 IMPLANT
TUBE CONNECTING 12'X1/4 (SUCTIONS) ×1
TUBE CONNECTING 12X1/4 (SUCTIONS) ×2 IMPLANT
TUBING UROLOGY SET (TUBING) ×3 IMPLANT
WATER STERILE IRR 500ML POUR (IV SOLUTION) ×3 IMPLANT

## 2019-04-17 NOTE — Discharge Instructions (Signed)
Ureteral Stent Implantation, Care After °This sheet gives you information about how to care for yourself after your procedure. Your health care provider may also give you more specific instructions. If you have problems or questions, contact your health care provider. °What can I expect after the procedure? °After the procedure, it is common to have: °· Nausea. °· Mild pain when you urinate. You may feel this pain in your lower back or lower abdomen. The pain should stop within a few minutes after you urinate. This may last for up to 1 week. °· A small amount of blood in your urine for several days. °Follow these instructions at home: °Medicines °· Take over-the-counter and prescription medicines only as told by your health care provider. °· If you were prescribed an antibiotic medicine, take it as told by your health care provider. Do not stop taking the antibiotic even if you start to feel better. °· Do not drive for 24 hours if you were given a sedative during your procedure. °· Ask your health care provider if the medicine prescribed to you requires you to avoid driving or using heavy machinery. °Activity °· Rest as told by your health care provider. °· Avoid sitting for a long time without moving. Get up to take short walks every 1-2 hours. This is important to improve blood flow and breathing. Ask for help if you feel weak or unsteady. °· Return to your normal activities as told by your health care provider. Ask your health care provider what activities are safe for you. °General instructions ° °· Watch for any blood in your urine. Call your health care provider if the amount of blood in your urine increases. °· If you have a catheter: °? Follow instructions from your health care provider about taking care of your catheter and collection bag. °? Do not take baths, swim, or use a hot tub until your health care provider approves. Ask your health care provider if you may take showers. You may only be allowed to  take sponge baths. °· Drink enough fluid to keep your urine pale yellow. °· Do not use any products that contain nicotine or tobacco, such as cigarettes, e-cigarettes, and chewing tobacco. These can delay healing after surgery. If you need help quitting, ask your health care provider. °· Keep all follow-up visits as told by your health care provider. This is important. °Contact a health care provider if: °· You have pain that gets worse or does not get better with medicine, especially pain when you urinate. °· You have difficulty urinating. °· You feel nauseous or you vomit repeatedly during a period of more than 2 days after the procedure. °Get help right away if: °· Your urine is dark red or has blood clots in it. °· You are leaking urine (have incontinence). °· The end of the stent comes out of your urethra. °· You cannot urinate. °· You have sudden, sharp, or severe pain in your abdomen or lower back. °· You have a fever. °· You have swelling or pain in your legs. °· You have difficulty breathing. °Summary °· After the procedure, it is common to have mild pain when you urinate that goes away within a few minutes after you urinate. This may last for up to 1 week. °· Watch for any blood in your urine. Call your health care provider if the amount of blood in your urine increases. °· Take over-the-counter and prescription medicines only as told by your health care provider. °· Drink   enough fluid to keep your urine pale yellow. °This information is not intended to replace advice given to you by your health care provider. Make sure you discuss any questions you have with your health care provider. °Document Released: 05/24/2013 Document Revised: 06/28/2018 Document Reviewed: 06/29/2018 °Elsevier Patient Education © 2020 [employer] ° ° °Post Anesthesia Home Care Instructions ° °Activity: °Get plenty of rest for the remainder of the day. A responsible individual must stay with you for 24 hours following the  procedure.  °For the next 24 hours, DO NOT: °-Drive a car °-Operate machinery °-Drink alcoholic beverages °-Take any medication unless instructed by your physician °-Make any legal decisions or sign important papers. ° °Meals: °Start with liquid foods such as gelatin or soup. Progress to regular foods as tolerated. Avoid greasy, spicy, heavy foods. If nausea and/or vomiting occur, drink only clear liquids until the nausea and/or vomiting subsides. Call your physician if vomiting continues. ° °Special Instructions/Symptoms: °Your throat may feel dry or sore from the anesthesia or the breathing tube placed in your throat during surgery. If this causes discomfort, gargle with warm salt water. The discomfort should disappear within 24 hours. ° °If you had a scopolamine patch placed behind your ear for the management of post- operative nausea and/or vomiting: ° °1. The medication in the patch is effective for 72 hours, after which it should be removed.  Wrap patch in a tissue and discard in the trash. Wash hands thoroughly with soap and water. °2. You may remove the patch earlier than 72 hours if you experience unpleasant side effects which may include dry mouth, dizziness or visual disturbances. °3. Avoid touching the patch. Wash your hands with soap and water after contact with the patch. °   ° ° °

## 2019-04-17 NOTE — Anesthesia Preprocedure Evaluation (Signed)
Anesthesia Evaluation  Patient identified by MRN, date of birth, ID band Patient awake    Reviewed: Allergy & Precautions, NPO status , Patient's Chart, lab work & pertinent test results  History of Anesthesia Complications Negative for: history of anesthetic complications  Airway Mallampati: II  TM Distance: >3 FB Neck ROM: Full    Dental no notable dental hx. (+) Dental Advisory Given   Pulmonary former smoker,    Pulmonary exam normal breath sounds clear to auscultation       Cardiovascular negative cardio ROS Normal cardiovascular exam Rhythm:Regular Rate:Normal     Neuro/Psych  Headaches, PSYCHIATRIC DISORDERS Anxiety Depression    GI/Hepatic Neg liver ROS, rectal cancer   Endo/Other  negative endocrine ROS  Renal/GU   negative genitourinary   Musculoskeletal  (+) Arthritis ,   Abdominal   Peds negative pediatric ROS (+)  Hematology  (+) anemia ,   Anesthesia Other Findings   Reproductive/Obstetrics                             Anesthesia Physical  Anesthesia Plan  ASA: II  Anesthesia Plan: General   Post-op Pain Management:    Induction: Intravenous  PONV Risk Score and Plan: 3 and Dexamethasone, Ondansetron, Midazolam and Treatment may vary due to age or medical condition  Airway Management Planned: LMA  Additional Equipment: None  Intra-op Plan:   Post-operative Plan: Extubation in OR  Informed Consent: I have reviewed the patients History and Physical, chart, labs and discussed the procedure including the risks, benefits and alternatives for the proposed anesthesia with the patient or authorized representative who has indicated his/her understanding and acceptance.     Dental advisory given  Plan Discussed with: CRNA and Anesthesiologist  Anesthesia Plan Comments:         Anesthesia Quick Evaluation

## 2019-04-17 NOTE — H&P (Signed)
Urology Admission H&P  Chief Complaint: right flank pain  History of Present Illness: Norma Colon is a 58yo with a hx of nephrolithiasis who underwent right ureteroscopy for an impacted right proximal ureteral calculus. After the calculus was removed she developed worsening right flank paina nd was found to have hydronephrosis of the proximal ureter and a concern for stricture.   Past Medical History:  Diagnosis Date  . 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 Anxiety   . Arthritis   . Chemotherapy induced neutropenia ([place])   . Depression   . Headache    migraines   . History of HPV infection   . History of kidney stones   . Hydronephrosis, right   . Restless leg syndrome   . Scoliosis    Past Surgical History:  Procedure Laterality Date  . COLPOSCOPY  04/04/2018  . CYSTOSCOPY WITH RETROGRADE PYELOGRAM, URETEROSCOPY AND STENT PLACEMENT Right 12/12/2018   Procedure: CYSTOSCOPY WITH RETROGRADE PYELOGRAM, URETEROSCOPY, STONE EXTRACTION AND STENT PLACEMENT;  Surgeon: Cleon Gustin, MD;  Location: [facility];  Service: Urology;  Laterality: Right;  . CYSTOSCOPY WITH STENT PLACEMENT Right 11/21/2018   Procedure: CYSTOSCOPY, RIGHT RETROGRADE PYELOGRAM, WITH RIGHT URETERAL STENT PLACEMENT;  Surgeon: Cleon Gustin, MD;  Location: [facility];  Service: Urology;  Laterality: Right;  . left forearm surgery due to calcium rock     . SHOULDER ARTHROSCOPY W/ ROTATOR CUFF REPAIR Right   . XI ROBOTIC ASSISTED LOWER ANTERIOR RESECTION N/A 09/16/2018   Procedure: XI ROBOTIC ASSISTED LOWER ANTERIOR RESECTION ERAS PATHWAY;  Surgeon: Leighton Ruff, MD;  Location: [facility];  Service: General;  Laterality: N/A;    Home Medications:  Current Facility-Administered Medications   Medication Dose Route Frequency Provider Last Rate Last Dose  . ceFAZolin (ANCEF) IVPB 2g/100 mL premix  2 g Intravenous 30 min Pre-Op Alyson Ingles Candee Furbish, MD      . lactated ringers infusion   Intravenous Continuous Myrtie Soman, MD 50 mL/hr at 04/17/19 1200     Allergies:  Allergies  Allergen Reactions  . Codeine Other (See Comments)    Causes migraine--patient requests this be kept on allergy list    Family History  Problem Relation Age of Onset  . COPD Father        smoker  . Stomach cancer Father        spread to lungs and bones  . Arthritis Mother   . Heart attack Paternal Grandmother   . Heart attack Paternal Grandfather   . Heart attack Maternal Grandmother   . Stroke Maternal Grandmother   . Colon cancer Neg Hx    Social History:  reports that she quit smoking about 7 months ago. Her smoking use included cigarettes. She has a 30.00 pack-year smoking history. She has never used smokeless tobacco. She reports current alcohol use. She reports that she does not use drugs.  Review of Systems  Genitourinary: Positive for flank pain.  All other systems reviewed and are negative.   Physical Exam:  Vital signs in last 24 hours: Temp:  [98.1 F (36.7 C)] 98.1 F (36.7 C) (07/13 1140) Pulse Rate:  [102] 102 (07/13 1140) Resp:  [16] 16 (07/13 1140) BP: (145)/(92) 145/92 (07/13 1140) SpO2:  [99 %] 99 % (07/13 1140) Weight:  [91.4  kg] 91.4 kg (07/13 1140) Physical Exam  Constitutional: She is oriented to person, place, and time. She appears well-developed and well-nourished.  HENT:  Head: Normocephalic and atraumatic.  Eyes: Pupils are equal, round, and reactive to light. EOM are normal.  Neck: Normal range of motion. No thyromegaly present.  Cardiovascular: Normal rate and regular rhythm.  Respiratory: Effort normal. No respiratory distress.  GI: Soft. She exhibits no distension.  Musculoskeletal: Normal range of motion.        General: No edema.  Neurological: She  is alert and oriented to person, place, and time.  Skin: Skin is warm and dry.  Psychiatric: She has a normal mood and affect. Her behavior is normal. Judgment and thought content normal.    Laboratory Data:  No results found for this or any previous visit (from the past 24 hour(s)). Recent Results (from the past 240 hour(s))  SARS Coronavirus 2 (Performed in [facility] hospital lab)     Status: None   Collection Time: 04/13/19 11:48 AM   Specimen: Nasal Swab  Result Value Ref Range Status   SARS Coronavirus 2 NEGATIVE NEGATIVE Final    Comment: (NOTE) SARS-CoV-2 target nucleic acids are NOT DETECTED. The SARS-CoV-2 RNA is generally detectable in upper and lower respiratory specimens during the acute phase of infection. Negative results do not preclude SARS-CoV-2 infection, do not rule out co-infections with other pathogens, and should not be used as the sole basis for treatment or other patient management decisions. Negative results must be combined with clinical observations, patient history, and epidemiological information. The expected result is Negative. Fact Sheet for Patients: SugarRoll.be Fact Sheet for Healthcare Providers: https://www.woods-mathews.com/ This test is not yet approved or cleared by the [place] FDA and  has been authorized for detection and/or diagnosis of SARS-CoV-2 by FDA under an Emergency Use Authorization (EUA). This EUA will remain  in effect (meaning this test can be used) for the duration of the COVID-19 declaration under Section 56 4(b)(1) of the Act, 21 U.S.C. section 360bbb-3(b)(1), unless the authorization is terminated or revoked sooner. Performed at [facility], [place] [address], [place], [place] [address]    Creatinine: No results for input(s): CREATININE in the last 168 hours. Baseline Creatinine: unknwon  Impression/Assessment:  57yo with right hydronephrosis  Plan:  The  risks/benefits/alternatives to right diagnostic ureteroscopy and possible dilation of a ureteral stricture was explained to the patient and she understands and wishes to proceed with surgery  Nicolette Bang 04/17/2019, 12:45 PM

## 2019-04-17 NOTE — Transfer of Care (Signed)
Immediate Anesthesia Transfer of Care Note  Patient: Norma Colon  Procedure(s) Performed: Procedure(s) (LRB): CYSTOSCOPY WITH RETROGRADE PYELOGRAM, BALLOON DILITATION, URETEROSCOPY AND STENT PLACEMENT (Right)  Patient Location: PACU  Anesthesia Type: General  Level of Consciousness: awake, oriented, sedated and patient cooperative  Airway & Oxygen Therapy: Patient Spontanous Breathing and Patient connected to face mask oxygen  Post-op Assessment: Report given to PACU RN and Post -op Vital signs reviewed and stable  Post vital signs: Reviewed and stable  Complications: No apparent anesthesia complications Last Vitals:  Vitals Value Taken Time  BP    Temp    Pulse 100 04/17/19 1435  Resp 15 04/17/19 1435  SpO2 100 % 04/17/19 1435  Vitals shown include unvalidated device data.  Last Pain:  Vitals:   04/17/19 1159  TempSrc:   PainSc: 7       Patients Stated Pain Goal: 4 (04/17/19 1159)

## 2019-04-17 NOTE — Anesthesia Procedure Notes (Signed)
Procedure Name: LMA Insertion Date/Time: 04/17/2019 1:56 PM Performed by: Suan Halter, CRNA Pre-anesthesia Checklist: Patient identified, Emergency Drugs available, Suction available and Patient being monitored Patient Re-evaluated:Patient Re-evaluated prior to induction Oxygen Delivery Method: Circle system utilized Preoxygenation: Pre-oxygenation with 100% oxygen Induction Type: IV induction Ventilation: Mask ventilation without difficulty LMA: LMA inserted LMA Size: 4.0 Number of attempts: 1 Airway Equipment and Method: Bite block Placement Confirmation: positive ETCO2 Tube secured with: Tape Dental Injury: Teeth and Oropharynx as per pre-operative assessment

## 2019-04-17 NOTE — Op Note (Addendum)
Preoperative diagnosis: Right hydronephrosis  Postoperative diagnosis: right ureteral stricture  Procedure: 1 cystoscopy 2.  right retrograde pyelography 3.  Intraoperative fluoroscopy, under one hour, with interpretation 4.  Right diagnostic ureteroscopy 5. Balloon dilation of a right ureteral stricture 6. Right 8x24 JJ ureteral stent placement  Attending: Rosie Fate  Anesthesia: General  Estimated blood loss: None  Drains: right 8x24 JJ ureteral stent  Specimens: none  Antibiotics: ancef  Findings: Short segment, <0.5cm right 3 french ureteral stricture at the right pelvic brim. Moderate right hydronephrosis.  Indications: Patient is a 58 year old female with a history of right hydronephrosis after removal of an impacted right proximal ureteral calculus.  After discussing treatment options, she decided proceed with right diagnostic ureteroscopy  Procedure her in detail: The patient was brought to the operating room and a brief timeout was done to ensure correct patient, correct procedure, correct site.  General anesthesia was administered patient was placed in dorsal lithotomy position.  Her genitalia was then prepped and draped in usual sterile fashion.  A rigid 26 French cystoscope was passed in the urethra and the bladder.  Bladder was inspected free masses or lesions.  the right ureteral orifices were in the normal orthotopic locations.  a 6 french ureteral catheter was then instilled into the right ureter orifice.  a gentle retrograde was obtained and findings noted above.  we then placed a zip wire through the ureteral catheter and advanced up to the renal pelvis.  we then removed the cystoscope and cannulated the right ureteral orifice with a semirigid ureteroscope.  we then performed ureteroscopy up to the level of the pelvic brim and encountered a short 2-3 french ureteral stricture. We then advanced a sensor wire past the stricture and up to the UPJ. We then removed the  ureteroscope and over the wire advanced a Uromax 4cm x 65french ureteral balloon dilator through the stricutre. We then filled the balloon with contrast to 15cm of water and held it for 2 minutes. We then removed the balloon and advanced a 8x24 JJ ureteral stent over the sensor wire and up to the renal pelvis. We then removed the wire and good coil was noted int he renal pelvis and bladder under fluoroscopy.  the bladder was then drained and this concluded the procedure which was well tolerated by patient.  Complications: None  Condition: Stable, extubated, transferred to PACU  Plan: Pt is to followup in 2 weeks. Her stent is to remain in place for 4 weeks

## 2019-04-18 ENCOUNTER — Encounter (HOSPITAL_BASED_OUTPATIENT_CLINIC_OR_DEPARTMENT_OTHER): Payer: Self-pay | Admitting: Urology

## 2019-04-19 NOTE — Anesthesia Postprocedure Evaluation (Signed)
Anesthesia Post Note  Patient: Norma Colon  Procedure(s) Performed: CYSTOSCOPY WITH RETROGRADE PYELOGRAM, BALLOON DILITATION,DIAGNOSTIC  URETEROSCOPY AND STENT PLACEMENT (Right Renal)     Patient location during evaluation: PACU Anesthesia Type: General Level of consciousness: awake and alert Pain management: pain level controlled Vital Signs Assessment: post-procedure vital signs reviewed and stable Respiratory status: spontaneous breathing, nonlabored ventilation, respiratory function stable and patient connected to nasal cannula oxygen Cardiovascular status: blood pressure returned to baseline and stable Postop Assessment: no apparent nausea or vomiting Anesthetic complications: no    Last Vitals:  Vitals:   04/17/19 1510 04/17/19 1600  BP:  120/74  Pulse:  80  Resp: 12 14  Temp:  36.6 C  SpO2:  100%    Last Pain:  Vitals:   04/18/19 1016  TempSrc:   PainSc: 5                  Catalina Gravel

## 2019-05-10 ENCOUNTER — Telehealth: Payer: Self-pay | Admitting: *Deleted

## 2019-05-10 NOTE — Telephone Encounter (Addendum)
Called to report over 3 days has developed swelling in her ankles to point "I have no ankles". Does not resolve overnight and is uncomfortable to walk. Denies any increase in sodium intake. Asking if MD needs to see her about this? Is also having a lot of pain in her lower back since her stent exchange on 04/21/19. Urology told her it will continue until stent removed on 05/18/19. Per Dr. Benay Spice: Contact PCP regarding ankle swelling. No related to her cancer. F/U with urology on pain. Patient notified

## 2019-05-24 ENCOUNTER — Encounter: Payer: Self-pay | Admitting: Gastroenterology

## 2019-05-29 ENCOUNTER — Ambulatory Visit (HOSPITAL_COMMUNITY)
Admission: RE | Admit: 2019-05-29 | Discharge: 2019-05-29 | Disposition: A | Discharge status: Home / Self Care | Payer: 59 | Source: Ambulatory Visit | Attending: Nurse Practitioner | Admitting: Nurse Practitioner

## 2019-05-29 ENCOUNTER — Inpatient Hospital Stay: Payer: 59 | Attending: Nurse Practitioner

## 2019-05-29 ENCOUNTER — Other Ambulatory Visit: Payer: Self-pay

## 2019-05-29 DIAGNOSIS — G43909 Migraine, unspecified, not intractable, without status migrainosus: Secondary | ICD-10-CM | POA: Diagnosis not present

## 2019-05-29 DIAGNOSIS — D701 Agranulocytosis secondary to cancer chemotherapy: Secondary | ICD-10-CM | POA: Diagnosis not present

## 2019-05-29 DIAGNOSIS — M79603 Pain in arm, unspecified: Secondary | ICD-10-CM | POA: Diagnosis not present

## 2019-05-29 DIAGNOSIS — N132 Hydronephrosis with renal and ureteral calculous obstruction: Secondary | ICD-10-CM | POA: Insufficient documentation

## 2019-05-29 DIAGNOSIS — C20 Malignant neoplasm of rectum: Secondary | ICD-10-CM | POA: Insufficient documentation

## 2019-05-29 DIAGNOSIS — E041 Nontoxic single thyroid nodule: Secondary | ICD-10-CM | POA: Diagnosis not present

## 2019-05-29 LAB — CMP (CANCER CENTER ONLY)
ALT: 27 U/L (ref 0–44)
AST: 29 U/L (ref 15–41)
Albumin: 3.9 g/dL (ref 3.5–5.0)
Alkaline Phosphatase: 139 U/L — ABNORMAL HIGH (ref 38–126)
Anion gap: 10 (ref 5–15)
BUN: 18 mg/dL (ref 6–20)
CO2: 27 mmol/L (ref 22–32)
Calcium: 9.8 mg/dL (ref 8.9–10.3)
Chloride: 104 mmol/L (ref 98–111)
Creatinine: 1.24 mg/dL — ABNORMAL HIGH (ref 0.44–1.00)
GFR, Est AFR Am: 57 mL/min — ABNORMAL LOW (ref >60)
GFR, Estimated: 49 mL/min — ABNORMAL LOW (ref >60)
Glucose, Bld: 95 mg/dL (ref 70–99)
Potassium: 4.6 mmol/L (ref 3.5–5.1)
Sodium: 141 mmol/L (ref 135–145)
Total Bilirubin: 0.6 mg/dL (ref 0.3–1.2)
Total Protein: 7.6 g/dL (ref 6.5–8.1)

## 2019-05-29 LAB — CEA (IN HOUSE-CHCC): CEA (CHCC-In House): 2.04 ng/mL (ref 0.00–5.00)

## 2019-05-29 MED ORDER — IOHEXOL 300 MG/ML  SOLN
100.0000 mL | Freq: Once | INTRAMUSCULAR | Status: AC | PRN
  Administered 2019-05-29: 14:00:00 100 mL via INTRAVENOUS

## 2019-05-29 MED ORDER — SODIUM CHLORIDE (PF) 0.9 % IJ SOLN
INTRAMUSCULAR | Status: AC
  Filled 2019-05-29: qty 50

## 2019-05-31 ENCOUNTER — Telehealth: Payer: Self-pay | Admitting: *Deleted

## 2019-05-31 DIAGNOSIS — C20 Malignant neoplasm of rectum: Secondary | ICD-10-CM

## 2019-05-31 NOTE — Telephone Encounter (Addendum)
Patient left VM reporting she has viewed her CT report and sees that she now has thyroid problems and "I'm headed toward kidney failure". Wants to ensure Dr. Benay Spice will have a game plan by her Monday appointment. Asking that Dr. Benay Spice discusses case with Dr. Alyson Ingles before Monday. She reports she is calling Dr. Alyson Ingles herself today. Copy of CT report faxed to Dr. Alyson Ingles. Per Dr. Benay Spice: Management of hydronephrosis is per Dr. Alyson Ingles. Thyroid nodule likley benign. Can order [place] if patient wishes. Will discuss at appointment. Needs to push fluids and will check BMP on Monday as well. Called at 2:15 and left VM for patient with the above information and to come at 1130 on Monday to allow time for lab collection.

## 2019-06-05 ENCOUNTER — Inpatient Hospital Stay: Payer: 59

## 2019-06-05 ENCOUNTER — Other Ambulatory Visit: Payer: Self-pay

## 2019-06-05 ENCOUNTER — Inpatient Hospital Stay (HOSPITAL_BASED_OUTPATIENT_CLINIC_OR_DEPARTMENT_OTHER): Payer: 59 | Admitting: Oncology

## 2019-06-05 VITALS — BP 124/67 | HR 86 | Temp 98.2°F | Resp 18 | Ht 69.0 in | Wt 207.7 lb

## 2019-06-05 DIAGNOSIS — C20 Malignant neoplasm of rectum: Secondary | ICD-10-CM | POA: Diagnosis not present

## 2019-06-05 LAB — BASIC METABOLIC PANEL - CANCER CENTER ONLY
Anion gap: 9 (ref 5–15)
BUN: 19 mg/dL (ref 6–20)
CO2: 27 mmol/L (ref 22–32)
Calcium: 8.9 mg/dL (ref 8.9–10.3)
Chloride: 106 mmol/L (ref 98–111)
Creatinine: 1.16 mg/dL — ABNORMAL HIGH (ref 0.44–1.00)
GFR, Est AFR Am: >60 mL/min (ref >60)
GFR, Estimated: 53 mL/min — ABNORMAL LOW (ref >60)
Glucose, Bld: 106 mg/dL — ABNORMAL HIGH (ref 70–99)
Potassium: 3.4 mmol/L — ABNORMAL LOW (ref 3.5–5.1)
Sodium: 142 mmol/L (ref 135–145)

## 2019-06-05 NOTE — Progress Notes (Signed)
[facility] OFFICE PROGRESS NOTE   Diagnosis: Rectal cancer  INTERVAL HISTORY:   Norma Colon returns as scheduled.  She has pain in the right flank region.  She is concerned about the persistent right hydronephrosis and right renal function. Ms. Olsson has numbness in the toes and distal sole of the foot bilaterally.  No significant hand numbness.  Objective:  Vital signs in last 24 hours:  Blood pressure 124/67, pulse 86, temperature 98.2 F (36.8 C), temperature source Oral, resp. rate 18, height 5' 9"  (1.753 m), weight 207 lb 11.2 oz (94.2 kg), SpO2 99 %.    HEENT: Neck without mass Lymphatics: No cervical, supraclavicular, axillary, or inguinal nodes GI: No hepatosplenomegaly, no mass, nontender Vascular: No leg edema Musculoskeletal: Mild tenderness in the right flank    Lab Results:  Lab Results  Component Value Date   WBC 4.2 03/30/2019   HGB 12.9 03/30/2019   HCT 39.3 03/30/2019   MCV 96.6 03/30/2019   PLT 193 03/30/2019   NEUTROABS 3.1 03/30/2019    CMP  Lab Results  Component Value Date   NA 141 05/29/2019   K 4.6 05/29/2019   CL 104 05/29/2019   CO2 27 05/29/2019   GLUCOSE 95 05/29/2019   BUN 18 05/29/2019   CREATININE 1.24 (H) 05/29/2019   CALCIUM 9.8 05/29/2019   PROT 7.6 05/29/2019   ALBUMIN 3.9 05/29/2019   AST 29 05/29/2019   ALT 27 05/29/2019   ALKPHOS 139 (H) 05/29/2019   BILITOT 0.6 05/29/2019   GFRNONAA 49 (L) 05/29/2019   GFRAA 57 (L) 05/29/2019    Lab Results  Component Value Date   CEA1 2.04 05/29/2019   Images: CT images from 05/29/2019 reviewed  Medications: I have reviewed the patient's current medications.   Assessment/Plan: 1.  Rectal cancer ? 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 CTs 05/24/2018-mass beginning at proximal he 7.6 cm from the anal verge, "20 "perirectal lymph  nodes, no evidence of metastatic disease ? Pelvic MRI 06/01/2018-tumor measured at 10.8 cm from the anal sphincter, T3b, N1-2 left perirectal lymph nodes, each measuring 5 mm ? Radiation and Xeloda initiated 06/13/2018, completed 07/20/2018 ? Low anterior resection 09/16/2018,ypT3,ypN1c, 1 cm tumor, partial response-score 2, 0/13 lymph nodes positive, 2 tumor deposits, MSI-stable, no loss of mismatch repair protein expression ? Cycle 1 CAPOX 10/17/2018 ? Cycle 2 CAPOX 11/07/2018 (oxaliplatin dose reduced due to neutropenia) ? Cycle 3 CAPOX 11/28/2018 ? Cycle 4 CAPOX 12/19/2018 ? CTs 05/29/2019-no evidence of metastatic disease, severe right hydronephrosis, left thyroid nodule 2. Tobacco use 3. 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 Alyson Ingles, urology 4. Migraine headaches 5. Restless legs 6. Arm pain during the oxaliplatin infusion and for 1 week after cycle 1.  The oxaliplatin will be further diluted and infusion time extended getting with cycle 2. 7. Neutropenia secondary to chemotherapy.  Oxaliplatin dose reduced beginning with cycle 2. 8. Diarrhea following cycle 2 CAPOX- Xeloda dose reduced with cycle 3 9. 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 Patman is in remission from rectal cancer.  She will return for an office visit and CEA in 4 months. The restaging CTs revealed a left thyroid nodule.  I  referred her for a thyroid ultrasound.  She has persistent severe right hydronephrosis..  We will refer her to Dr. Alyson Ingles for management of the hydronephrosis.    Betsy Coder, MD  06/05/2019  12:18 PM

## 2019-06-06 ENCOUNTER — Telehealth: Payer: Self-pay | Admitting: Oncology

## 2019-06-06 ENCOUNTER — Telehealth: Payer: Self-pay | Admitting: *Deleted

## 2019-06-06 NOTE — Telephone Encounter (Signed)
Called and spoke with patient. Confirmed appt  °

## 2019-06-06 NOTE — Telephone Encounter (Signed)
47MONTH FOLLOW UP FOR [id number] STUDY; Called patient to check on her status for the 48month time point since enrolling on study. Patient has completed treatment with curative intent and no further treatment planned at this time for her stage 3 rectal cancer.MD notes state patient currently in remission. She states she is feeling well, but having some issues with her kidneys. She is able to perform all of her ADLs and instrumental ADLs by herself.Patient is working full time.RN assessed ECOG level as 1and Dr. Benay Spice agrees. Patientdenies any changes to her insurance since beginning on study. Asked patient to check her email for study questionnaire from [employer].  Patient states she has not received any emails and asked that they be sent again. Informed patient that the 6 month survey has not been completed yet, but is still available.  Asked patient if she is willing to complete both surveys for 6 month and 12 month and she agreeed. Research nurse will email Simonne Come to request they email patient again for both surveys.  Thanked patient for her ongoing participation in this study. I will contact her again in about 12 months for the 24 month follow up form. Encouraged patient tocontact research nurse if any questions before next contact.She verbalized understanding.  Foye Spurling, BSN, RN Clinical Research Nurse 06/06/2019 11:56 AM

## 2019-06-13 ENCOUNTER — Telehealth: Payer: Self-pay | Admitting: *Deleted

## 2019-06-13 NOTE — Telephone Encounter (Signed)
Reports going to beach recently. Has returned and had no BM since 06/08/19 and she is having right abdominal pain. Took  #3 Dulcolax tabs about 3 hours ago. Suggested she try OTC Magnesium Citrate 1/2 bottle and repeat in few hours if no BM. Also try OTC suppository as well. Call back tomorrow if no results. Constipation could be related to her diet changes during her trip. Also reports having more pain in her back and has put in a call to Dr. Alyson Ingles to get him to see her about the hydronephrosis sooner. She has not heard about the thyroid US yet. Received notice from Dr. Vincente Poli that she is due another colonoscopy (last 05/2018). She really wants to delay this till all the renal and thyroid issues are resolved. Also struggles with paying or all the physician visits, tests, and procedures.

## 2019-06-16 ENCOUNTER — Telehealth: Payer: Self-pay | Admitting: Gastroenterology

## 2019-06-16 ENCOUNTER — Telehealth: Payer: Self-pay | Admitting: *Deleted

## 2019-06-16 ENCOUNTER — Other Ambulatory Visit: Payer: Self-pay | Admitting: *Deleted

## 2019-06-16 NOTE — Telephone Encounter (Signed)
Has inflammation/infefctions going around the kidney -seen by [facility]-told by Dr. Alyson Ingles she needs immediate surgery -?a suprapubic catheter placed due to not being able to place another stent-(scheduled the week of 06/19/2019);  Was told she has gall stones/also seen on CT from 05/29/2019- Patient would like to know if/what needs to be done to get rid of the gallstones or if she needs to have gall bladder removed she would like to have this done at the same time of her scheduled kidney sx- As of this time, her insurance does not pay for anesthesia- would like to make sure everything gets done at the same time;   Please advise

## 2019-06-16 NOTE — Telephone Encounter (Signed)
Patient called reporting she saw Dr. Alyson Ingles yesterday and he is planning to put in nephrostomy tube next week to have for 4-6 weeks and he will then go in and correct ureter and reconnect. States the issue is due to scar tissue from radiation and surgery. She expressed concern that Dr. Alyson Ingles is not communicating with Dr. Benay Spice. She is asking for Dr. Gearldine Shown input on what she needs to do about the gallstones seen on CT scan? Who would she see--GI or surgeon? She is having no pain or N/V from the gallstones. Prefers to speak w/Dr. Benay Spice if he is able.

## 2019-06-19 ENCOUNTER — Telehealth (HOSPITAL_COMMUNITY): Payer: Self-pay

## 2019-06-19 ENCOUNTER — Other Ambulatory Visit (HOSPITAL_COMMUNITY): Payer: Self-pay | Admitting: Urology

## 2019-06-19 DIAGNOSIS — N135 Crossing vessel and stricture of ureter without hydronephrosis: Secondary | ICD-10-CM

## 2019-06-19 NOTE — Telephone Encounter (Signed)
Left message for patient to call back to the office for information

## 2019-06-19 NOTE — Telephone Encounter (Signed)
GB stones seen on CT, but also seen on prior CTs in 2020 and 2019. The GB is o/w normal appearing (ie, no inflammation) and no duct dilation. Labs with normal TBili and AST/ALT. Therefore, in the absence of sxs from these gallstones, the GB does not need to be removed for the incidental finding of stones. If she developed sxs concerning for gall stone disease, we would refer her to [facility] for consideration of ccy. Thanks.

## 2019-06-19 NOTE — Telephone Encounter (Signed)
Called to schedule nephrostomy tube placement, no answer, left vm. AW  

## 2019-06-19 NOTE — Telephone Encounter (Signed)
Patient returned call to the office-patient given information from MD; patient requesting to be scheduled to talk with Dr. Bryan Lemma concerning her recall colonoscopy[-appt scheduled on 06/29/2019 at 3:00 pm; patient has been advised to call back to the office should questions/concerns arise; patient verbalized understanding of information/instructions;

## 2019-06-20 ENCOUNTER — Other Ambulatory Visit: Payer: Self-pay | Admitting: Radiology

## 2019-06-20 ENCOUNTER — Other Ambulatory Visit: Payer: Self-pay | Admitting: Urology

## 2019-06-20 ENCOUNTER — Ambulatory Visit
Admission: RE | Admit: 2019-06-20 | Discharge: 2019-06-20 | Disposition: A | Discharge status: Home / Self Care | Payer: 59 | Source: Ambulatory Visit | Attending: Oncology | Admitting: Oncology

## 2019-06-20 DIAGNOSIS — C20 Malignant neoplasm of rectum: Secondary | ICD-10-CM

## 2019-06-21 ENCOUNTER — Other Ambulatory Visit: Payer: Self-pay | Admitting: Student

## 2019-06-21 NOTE — Progress Notes (Signed)
Dr. Benay Spice will call patient

## 2019-06-22 ENCOUNTER — Encounter (HOSPITAL_COMMUNITY): Payer: Self-pay | Admitting: Interventional Radiology

## 2019-06-22 ENCOUNTER — Other Ambulatory Visit: Payer: Self-pay

## 2019-06-22 ENCOUNTER — Ambulatory Visit (HOSPITAL_COMMUNITY)
Admission: RE | Admit: 2019-06-22 | Discharge: 2019-06-22 | Disposition: A | Discharge status: Home / Self Care | Payer: 59 | Source: Ambulatory Visit | Attending: Urology | Admitting: Urology

## 2019-06-22 DIAGNOSIS — F329 Major depressive disorder, single episode, unspecified: Secondary | ICD-10-CM | POA: Diagnosis not present

## 2019-06-22 DIAGNOSIS — Z436 Encounter for attention to other artificial openings of urinary tract: Secondary | ICD-10-CM | POA: Diagnosis not present

## 2019-06-22 DIAGNOSIS — Z9221 Personal history of antineoplastic chemotherapy: Secondary | ICD-10-CM | POA: Insufficient documentation

## 2019-06-22 DIAGNOSIS — Z85048 Personal history of other malignant neoplasm of rectum, rectosigmoid junction, and anus: Secondary | ICD-10-CM | POA: Diagnosis not present

## 2019-06-22 DIAGNOSIS — F419 Anxiety disorder, unspecified: Secondary | ICD-10-CM | POA: Diagnosis not present

## 2019-06-22 DIAGNOSIS — N135 Crossing vessel and stricture of ureter without hydronephrosis: Secondary | ICD-10-CM | POA: Diagnosis not present

## 2019-06-22 DIAGNOSIS — Z87891 Personal history of nicotine dependence: Secondary | ICD-10-CM | POA: Insufficient documentation

## 2019-06-22 DIAGNOSIS — G43909 Migraine, unspecified, not intractable, without status migrainosus: Secondary | ICD-10-CM | POA: Diagnosis not present

## 2019-06-22 DIAGNOSIS — M1990 Unspecified osteoarthritis, unspecified site: Secondary | ICD-10-CM | POA: Diagnosis not present

## 2019-06-22 DIAGNOSIS — G2581 Restless legs syndrome: Secondary | ICD-10-CM | POA: Diagnosis not present

## 2019-06-22 DIAGNOSIS — Z923 Personal history of irradiation: Secondary | ICD-10-CM | POA: Insufficient documentation

## 2019-06-22 DIAGNOSIS — Z79899 Other long term (current) drug therapy: Secondary | ICD-10-CM | POA: Diagnosis not present

## 2019-06-22 DIAGNOSIS — Z7982 Long term (current) use of aspirin: Secondary | ICD-10-CM | POA: Insufficient documentation

## 2019-06-22 HISTORY — PX: IR NEPHROSTOMY PLACEMENT RIGHT: IMG6064

## 2019-06-22 LAB — BASIC METABOLIC PANEL
Anion gap: 13 (ref 5–15)
BUN: 15 mg/dL (ref 6–20)
CO2: 22 mmol/L (ref 22–32)
Calcium: 9.5 mg/dL (ref 8.9–10.3)
Chloride: 106 mmol/L (ref 98–111)
Creatinine, Ser: 1.17 mg/dL — ABNORMAL HIGH (ref 0.44–1.00)
GFR calc Af Amer: >60 mL/min (ref >60)
GFR calc non Af Amer: 52 mL/min — ABNORMAL LOW (ref >60)
Glucose, Bld: 109 mg/dL — ABNORMAL HIGH (ref 70–99)
Potassium: 3.5 mmol/L (ref 3.5–5.1)
Sodium: 141 mmol/L (ref 135–145)

## 2019-06-22 LAB — CBC
HCT: 36.4 % (ref 36.0–46.0)
Hemoglobin: 12.4 g/dL (ref 12.0–15.0)
MCH: 31.6 pg (ref 26.0–34.0)
MCHC: 34.1 g/dL (ref 30.0–36.0)
MCV: 92.9 fL (ref 80.0–100.0)
Platelets: 242 10*3/uL (ref 150–400)
RBC: 3.92 MIL/uL (ref 3.87–5.11)
RDW: 13.3 % (ref 11.5–15.5)
WBC: 6.3 10*3/uL (ref 4.0–10.5)
nRBC: 0 % (ref 0.0–0.2)

## 2019-06-22 LAB — PROTIME-INR
INR: 1.2 (ref 0.8–1.2)
Prothrombin Time: 14.9 seconds (ref 11.4–15.2)

## 2019-06-22 MED ORDER — MIDAZOLAM HCL 2 MG/2ML IJ SOLN
INTRAMUSCULAR | Status: AC
  Filled 2019-06-22: qty 2

## 2019-06-22 MED ORDER — MIDAZOLAM HCL 2 MG/2ML IJ SOLN
INTRAMUSCULAR | Status: AC | PRN
  Administered 2019-06-22: 1 mg via INTRAVENOUS
  Administered 2019-06-22: 0.5 mg via INTRAVENOUS

## 2019-06-22 MED ORDER — FENTANYL CITRATE (PF) 100 MCG/2ML IJ SOLN
INTRAMUSCULAR | Status: AC | PRN
  Administered 2019-06-22: 25 ug via INTRAVENOUS
  Administered 2019-06-22: 50 ug via INTRAVENOUS

## 2019-06-22 MED ORDER — LIDOCAINE HCL 1 % IJ SOLN
INTRAMUSCULAR | Status: AC
  Filled 2019-06-22: qty 20

## 2019-06-22 MED ORDER — CEFAZOLIN SODIUM-DEXTROSE 2-4 GM/100ML-% IV SOLN
INTRAVENOUS | Status: AC
  Filled 2019-06-22: qty 100

## 2019-06-22 MED ORDER — SODIUM CHLORIDE 0.9 % IV SOLN: INTRAVENOUS | Status: DC

## 2019-06-22 MED ORDER — OXYCODONE HCL 5 MG PO TABS
10.0000 mg | ORAL_TABLET | Freq: Once | ORAL | Status: AC
  Administered 2019-06-22: 12:00:00 10 mg via ORAL

## 2019-06-22 MED ORDER — LIDOCAINE HCL 1 % IJ SOLN
INTRAMUSCULAR | Status: AC | PRN
  Administered 2019-06-22: 15 mL

## 2019-06-22 MED ORDER — OXYCODONE HCL 5 MG PO TABS
ORAL_TABLET | ORAL | Status: AC
  Filled 2019-06-22: qty 2

## 2019-06-22 MED ORDER — IOHEXOL 300 MG/ML  SOLN
50.0000 mL | Freq: Once | INTRAMUSCULAR | Status: AC | PRN
  Administered 2019-06-22: 10 mL

## 2019-06-22 MED ORDER — CIPROFLOXACIN IN D5W 400 MG/200ML IV SOLN
400.0000 mg | INTRAVENOUS | Status: AC
  Administered 2019-06-22: 400 mg via INTRAVENOUS

## 2019-06-22 MED ORDER — CIPROFLOXACIN IN D5W 400 MG/200ML IV SOLN
INTRAVENOUS | Status: AC
  Filled 2019-06-22: qty 200

## 2019-06-22 MED ORDER — FENTANYL CITRATE (PF) 100 MCG/2ML IJ SOLN
INTRAMUSCULAR | Status: AC
  Filled 2019-06-22: qty 2

## 2019-06-22 MED ORDER — SODIUM CHLORIDE 0.9% FLUSH: 5.0000 mL | Freq: Three times a day (TID) | INTRAVENOUS | Status: DC

## 2019-06-22 NOTE — Discharge Instructions (Signed)
Percutaneous Nephrostomy, Care After °This sheet gives you information about how to care for yourself after your procedure. Your health care provider may also give you more specific instructions. If you have problems or questions, contact your health care provider. °What can I expect after the procedure? °After the procedure, it is common to have: °· Some soreness where the nephrostomy tube was inserted (tube insertion site). °· Blood-tinged drainage from the nephrostomy tube for the first 24 hours. °Follow these instructions at home: °Activity °· Return to your normal activities as told by your health care provider. Ask your health care provider what activities are safe for you. °· Avoid activities that may cause the nephrostomy tubing to bend. °· Do not take baths, swim, or use a hot tub until your health care provider approves. Ask your health care provider if you can take showers. Cover the nephrostomy tube dressing with a watertight covering when you take a shower. °· Do not drive for 24 hours if you were given a medicine to help you relax (sedative). °Care of the tube insertion site ° °· Follow instructions from your health care provider about how to take care of your tube insertion site. Make sure you: °? Wash your hands with soap and water before you change your bandage (dressing). If soap and water are not available, use hand sanitizer. °? Change your dressing as told by your health care provider. Be careful not to pull on the tube while removing the dressing. °? When you change the dressing, wash the skin around the tube, rinse well, and pat the skin dry. °· Check the tube insertion area every day for signs of infection. Check for: °? More redness, swelling, or pain. °? More fluid or blood. °? Warmth. °? Pus or a bad smell. °Care of the nephrostomy tube and drainage bag °· Always keep the tubing, the leg bag, or the bedside drainage bags below the level of the kidney so that your urine drains  freely. °· When connecting your nephrostomy tube to a drainage bag, make sure that there are no kinks in the tubing and that your urine is draining freely. You may want to use an elastic bandage to wrap any exposed tubing that goes from the nephrostomy tube to any of the connecting tubes. °· At night, you may want to connect your nephrostomy tube or the leg bag to a larger bedside drainage bag. °· Follow instructions from your health care provider about how to empty or change the drainage bag. °· Empty the drainage bag when it becomes ? full. °· Replace the drainage bag and any extension tubing that is connected to your nephrostomy tube every 3 weeks or as often as told by your health care provider. Your health care provider will explain how to change the drainage bag and extension tubing. °General instructions °· Take over-the-counter and prescription medicines only as told by your health care provider. °· Keep all follow-up visits as told by your health care provider. This is important. °Contact a health care provider if: °· You have problems with any of the valves or tubing. °· You have persistent pain or soreness in your back. °· You have more redness, swelling, or pain around your tube insertion site. °· You have more fluid or blood coming from your tube insertion site. °· Your tube insertion site feels warm to the touch. °· You have pus or a bad smell coming from your tube insertion site. °· You have increased urine output or you feel   burning when urinating. °Get help right away if: °· You have pain in your abdomen during the first week. °· You have chest pain or have trouble breathing. °· You have a new appearance of blood in your urine. °· You have a fever or chills. °· You have back pain that is not relieved by your medicine. °· You have decreased urine output. °· Your nephrostomy tube comes out. °This information is not intended to replace advice given to you by your health care provider. Make sure you  discuss any questions you have with your health care provider. °Document Released: 05/14/2004 Document Revised: 09/03/2017 Document Reviewed: 07/03/2016 °[employer]vier Patient Education © 2020 [employer] ° °

## 2019-06-22 NOTE — H&P (Signed)
Chief Complaint: Patient was seen in consultation today for right nephrostomy placement.  Referring Physician(s): Norma Colon  Supervising Physician: Norma Colon  Patient Status: [facility] - Out-pt  History of Present Illness: Emonnie Colon is a 58 y.o. female with a past medical history significant for anxiety, depression, arthritis, migraines, rectal cancer s/p surgery/chemo/radiation, right ureteral stricture and right hydronephrosis followed by Norma Colon who presents today for a right PCN placement. Norma Colon originally presented to [facility] ED on 11/20/18 with complaints of abdominal pain, nausea, vomiting and diarrhea. She was noted to be febrile, tachycardic, leukopenic with abnormal UA. She underwent a CT abd/pelvis which showed a 4 x 4 x 4.3 mm right UPJ stone at the level of the pelvic brim causing marked right-sided hydronephrosis. Urology was consulted and patient was admitted for further evaluation and management. She underwent cystoscopy, right retrograde pyelogram and right JJ stent placement on 11/21/18 with Norma Colon. She was discharged on 11/23/18 with outpatient urology follow up planned. She then underwent stent exchange and lithotripsy with Norma Colon on 12/12/18. She tells me today that the stent was removed about a month after it was exchanged in March however I am unable to find documentation of this in Epic today.  She was followed by urology and subsequently underwent dilation of right ureteral stricture as well as right JJ ureteral stent placement on 04/17/19 with Norma Colon. Norma Colon tells me this stent was also removed about a month later but, again, I cannot find documentation of this in Epic today. She continued to complain of right flank pain as well as new onset peripheral edema. She underwent a CT chest/abd/pelvis on 05/29/19 which was ordered by oncology for cancer follow up - this CT noted no evidence of metastatic disease however severe right hydronephrosis  with evidence of decreased renal function was again noted. A request was made to IR for right PCN placement with plans for patient to follow up with urology next month.  Norma Colon reports she is frustrated with her care lately and that "it feels like nobody is doing nothing to fix anything." She is confused why she is here and states she was under the impression that she would be seen by Norma Colon today as she states she has not seen him in months. She states that she is "basically in kidney failure and septic from the infection in my kidney" and that she feels that no one is taking her complaints seriously. We discussed that she had previously been treated with ureteral stenting in hopes that this would resolve her hydronephrosis however this was unfortunately not successful and that the typical next step in management is to place a PCN for which she presents today - after long discussion regarding the rationale of this procedure she states understanding and tells me she was not aware that this was all set in motion by Norma Colon and that she feels more reassured that her concerns are being taken seriously and her care team is working towards a solution. She reports constipation today, last bowel movement 2 days ago and has not resolved with 1 dose of dulcolax. She tells me that prior to treatment for her rectal cancer she would have a bowel movement every 2-3, occasionally 4, days however after treatment she was having several bowel movements per day with occasional diarrhea. She also reports abdominal distention, RLQ and right flank pain which is worse with movement, laying flat in bed and walking. She denies any nausea or vomiting,  she has been eating and drinking as usual. She reports hematuria but states "it's been like that for awhile, my doctors know all about it." She is unsure if she has had blood in her stool.   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[facility])  oncologist-- dr Norma Colon--  per last note in epic ,  clinical remission   dx 08/ 2019---- chemoradiation concurrent completed 07-20-2018;  s/p  low anterior resection 09-16-2018;   chemo 10-17-2018  ot 12-19-2018   Anxiety    Arthritis    Chemotherapy induced neutropenia ([place])    Depression    Headache    migraines    History of HPV infection    History of kidney stones    Hydronephrosis, right    Restless leg syndrome    Scoliosis     Past Surgical History:  Procedure Laterality Date   COLPOSCOPY  04/04/2018   CYSTOSCOPY WITH RETROGRADE PYELOGRAM, URETEROSCOPY AND STENT PLACEMENT Right 12/12/2018   Procedure: CYSTOSCOPY WITH RETROGRADE PYELOGRAM, URETEROSCOPY, STONE EXTRACTION AND STENT PLACEMENT;  Surgeon: Norma Gustin, MD;  Location: [facility];  Service: Urology;  Laterality: Right;   CYSTOSCOPY WITH RETROGRADE PYELOGRAM, URETEROSCOPY AND STENT PLACEMENT Right 04/17/2019   Procedure: CYSTOSCOPY WITH RETROGRADE PYELOGRAM, BALLOON Kevin  URETEROSCOPY AND STENT PLACEMENT;  Surgeon: Norma Gustin, MD;  Location: [facility];  Service: Urology;  Laterality: Right;   CYSTOSCOPY WITH STENT PLACEMENT Right 11/21/2018   Procedure: CYSTOSCOPY, RIGHT RETROGRADE PYELOGRAM, WITH RIGHT URETERAL STENT PLACEMENT;  Surgeon: Norma Gustin, MD;  Location: [facility];  Service: Urology;  Laterality: Right;   left forearm surgery due to calcium rock      SHOULDER ARTHROSCOPY W/ ROTATOR CUFF REPAIR Right    XI ROBOTIC ASSISTED LOWER ANTERIOR RESECTION N/A 09/16/2018   Procedure: XI ROBOTIC ASSISTED LOWER ANTERIOR RESECTION ERAS PATHWAY;  Surgeon: Norma Ruff, MD;  Location: [facility];  Service: General;  Laterality: N/A;    Allergies: Codeine  Medications: Prior to Admission medications   Medication Sig Start Date End Date Taking? Authorizing Provider  alprazolam Duanne Moron) 2 MG tablet Take 2 mg by mouth daily after breakfast.     Yes [provider]  buPROPion (WELLBUTRIN XL) 300 MG 24 hr tablet Take 300 mg by mouth daily after breakfast.    Yes [provider]  cyclobenzaprine (FLEXERIL) 10 MG tablet Take 20 mg by mouth at bedtime.    Yes [provider]  oxyCODONE (OXYCONTIN) 10 mg 12 hr tablet Take 10 mg by mouth every 12 (twelve) hours as needed (pain).    Yes [provider]  prochlorperazine (COMPAZINE) 10 MG tablet TAKE 1 TABLET(10 MG) BY MOUTH EVERY 6 HOURS AS NEEDED FOR NAUSEA OR VOMITING Patient taking differently: Take 10 mg by mouth every 6 (six) hours as needed for nausea or vomiting.  01/03/19  Yes Ladell Pier, MD  rOPINIRole (REQUIP) 0.25 MG tablet Take 0.25 mg by mouth at bedtime.   Yes [provider]  Ascorbic Acid (VITAMIN C) 1000 MG tablet Take 1,000 mg by mouth daily.    [provider]  aspirin 81 MG tablet Take 81 mg by mouth daily.    [provider]     Family History  Problem Relation Age of Onset   COPD Father        smoker   Stomach cancer Father        spread to lungs and bones  Arthritis Mother    Heart attack Paternal Grandmother    Heart attack Paternal Grandfather    Heart attack Maternal Grandmother    Stroke Maternal Grandmother    Colon cancer Neg Hx     Social History   Socioeconomic History   Marital status: Legally Separated    Spouse name: Not on file   Number of children: 1   Years of education: Not on file   Highest education level: Not on file  Occupational History   Occupation: Land  Social Needs   Financial resource strain: Not on file   Food insecurity    Worry: Not on file    Inability: Not on file   Transportation needs    Medical: Not on file    Non-medical: Not on file  Tobacco Use   Smoking status: Former Smoker    Packs/day: 1.00    Years: 30.00    Pack years: 30.00    Types: Cigarettes    Quit date: 09/16/2018    Years since quitting: 0.7    Smokeless tobacco: Never Used  Substance and Sexual Activity   Alcohol use: Yes    Comment: social   Drug use: Never   Sexual activity: Not on file  Lifestyle   Physical activity    Days per week: Not on file    Minutes per session: Not on file   Stress: Not on file  Relationships   Social connections    Talks on phone: Not on file    Gets together: Not on file    Attends religious service: Not on file    Active member of club or organization: Not on file    Attends meetings of clubs or organizations: Not on file    Relationship status: Not on file  Other Topics Concern   Not on file  Social History Narrative   Not on file     Review of Systems: A 12 point ROS discussed and pertinent positives are indicated in the HPI above.  All other systems are negative.  Review of Systems  Constitutional: Positive for fatigue. Negative for appetite change, chills and fever.  HENT: Negative for nosebleeds.   Respiratory: Negative for cough and shortness of breath.   Cardiovascular: Positive for leg swelling. Negative for chest pain.  Gastrointestinal: Positive for abdominal pain and constipation. Negative for diarrhea, nausea and vomiting.  Genitourinary: Positive for flank pain and hematuria. Negative for dysuria.  Musculoskeletal: Positive for back pain.  Skin: Negative for rash.  Neurological: Negative for dizziness, syncope and headaches.    Vital Signs: BP (!) 126/95 (BP Location: Left Arm)    Pulse 88    Temp 98.3 F (36.8 C) (Oral)    Resp 12    Ht 5\' 9"  (1.753 m)    Wt 207 lb (93.9 kg)    LMP  (LMP Unknown)    SpO2 99%    BMI 30.57 kg/m   Physical Exam Vitals signs reviewed.  Constitutional:      General: She is not in acute distress. HENT:     Head: Normocephalic.     Mouth/Throat:     Mouth: Mucous membranes are moist.     Pharynx: Oropharynx is clear. No oropharyngeal exudate or posterior oropharyngeal erythema.  Cardiovascular:     Rate and Rhythm: Normal  rate and regular rhythm.  Pulmonary:     Effort: Pulmonary effort is normal.     Breath sounds: Normal breath sounds.  Abdominal:  General: Bowel sounds are normal. There is no distension.     Palpations: Abdomen is soft.     Tenderness: There is no abdominal tenderness.  Skin:    General: Skin is warm and dry.  Neurological:     Mental Status: She is alert and oriented to person, place, and time.  Psychiatric:        Mood and Affect: Mood normal.        Behavior: Behavior normal.        Thought Content: Thought content normal.        Judgment: Judgment normal.      MD Evaluation Airway: WNL Heart: WNL Abdomen: WNL Chest/ Lungs: WNL ASA  Classification: 3 Mallampati/Airway Score: Two   Imaging: Ct Chest W Contrast  Result Date: 05/29/2019 CLINICAL DATA:  Rectal cancer status post chemotherapy and radiation therapy. EXAM: CT CHEST, ABDOMEN, AND PELVIS WITH CONTRAST TECHNIQUE: Multidetector CT imaging of the chest, abdomen and pelvis was performed following the standard protocol during bolus administration of intravenous contrast. CONTRAST:  118mL OMNIPAQUE IOHEXOL 300 MG/ML  SOLN COMPARISON:  CT abdomen pelvis 03/09/2019, 11/24/2018 and CT chest 05/24/2018. FINDINGS: CT CHEST FINDINGS Cardiovascular: Vascular structures are unremarkable. Heart size normal. No pericardial effusion. Mediastinum/Nodes: Low-attenuation left thyroid nodule measures 1.5 cm, new or increased from prior. No pathologically enlarged mediastinal, hilar or axillary lymph nodes. Esophagus is unremarkable. Lungs/Pleura: Lungs are clear. No pleural fluid. Airway is unremarkable. Musculoskeletal: No worrisome lytic or sclerotic lesions. CT ABDOMEN PELVIS FINDINGS Hepatobiliary: Liver is slightly decreased in attenuation diffusely. Liver measures approximately 18.8 cm. Stones are seen in the gallbladder. No biliary ductal dilatation. Pancreas: Negative. Spleen: Negative. Adrenals/Urinary Tract: Adrenal glands are  unremarkable. Severe right hydronephrosis to the level of the mid/distal right ureter, without cause identified. Findings are similar to 03/09/2019. There is slightly decreased attenuation of the right kidney on portal venous phase imaging with a minimal amount of contrast excreted on nephrographic phase imaging. Kidneys are otherwise unremarkable. Left ureter is decompressed. Bladder is low in volume. Stomach/Bowel: Stomach, small bowel and appendix are unremarkable. Formed stool is seen throughout the Colon, indicative of constipation. Rectosigmoid anastomosis. Colon is otherwise unremarkable. Vascular/Lymphatic: Circumaortic left renal vein. Vascular structures are otherwise unremarkable. No pathologically enlarged lymph nodes. Reproductive: Uterus is visualized.  No adnexal mass. Other: No free fluid. Mesenteries and peritoneum are otherwise unremarkable. Musculoskeletal: Grade 2 anterolisthesis of L5 on S1 with advanced secondary degenerative disc disease. No worrisome lytic or sclerotic lesions. IMPRESSION: 1. No evidence of metastatic disease. 2. Severe right hydronephrosis, as on 03/09/2019, with evidence of decreased renal function. 3. Enlarged steatotic liver. 4. Cholelithiasis. 5. Left thyroid nodule is new or enlarged from the prior exam. Consider further evaluation with thyroid ultrasound. If patient is clinically hyperthyroid, consider nuclear medicine thyroid uptake and scan. 6. Grade 2 anterolisthesis of L5 on S1 with advanced secondary degenerative disc disease. Electronically Signed   By: Lorin Picket M.D.   On: 05/29/2019 14:48   Ct Abdomen Pelvis W Contrast  Result Date: 05/29/2019 CLINICAL DATA:  Rectal cancer status post chemotherapy and radiation therapy. EXAM: CT CHEST, ABDOMEN, AND PELVIS WITH CONTRAST TECHNIQUE: Multidetector CT imaging of the chest, abdomen and pelvis was performed following the standard protocol during bolus administration of intravenous contrast. CONTRAST:  141mL  OMNIPAQUE IOHEXOL 300 MG/ML  SOLN COMPARISON:  CT abdomen pelvis 03/09/2019, 11/24/2018 and CT chest 05/24/2018. FINDINGS: CT CHEST FINDINGS Cardiovascular: Vascular structures are unremarkable. Heart size normal. No pericardial effusion. Mediastinum/Nodes: Low-attenuation  left thyroid nodule measures 1.5 cm, new or increased from prior. No pathologically enlarged mediastinal, hilar or axillary lymph nodes. Esophagus is unremarkable. Lungs/Pleura: Lungs are clear. No pleural fluid. Airway is unremarkable. Musculoskeletal: No worrisome lytic or sclerotic lesions. CT ABDOMEN PELVIS FINDINGS Hepatobiliary: Liver is slightly decreased in attenuation diffusely. Liver measures approximately 18.8 cm. Stones are seen in the gallbladder. No biliary ductal dilatation. Pancreas: Negative. Spleen: Negative. Adrenals/Urinary Tract: Adrenal glands are unremarkable. Severe right hydronephrosis to the level of the mid/distal right ureter, without cause identified. Findings are similar to 03/09/2019. There is slightly decreased attenuation of the right kidney on portal venous phase imaging with a minimal amount of contrast excreted on nephrographic phase imaging. Kidneys are otherwise unremarkable. Left ureter is decompressed. Bladder is low in volume. Stomach/Bowel: Stomach, small bowel and appendix are unremarkable. Formed stool is seen throughout the Colon, indicative of constipation. Rectosigmoid anastomosis. Colon is otherwise unremarkable. Vascular/Lymphatic: Circumaortic left renal vein. Vascular structures are otherwise unremarkable. No pathologically enlarged lymph nodes. Reproductive: Uterus is visualized.  No adnexal mass. Other: No free fluid. Mesenteries and peritoneum are otherwise unremarkable. Musculoskeletal: Grade 2 anterolisthesis of L5 on S1 with advanced secondary degenerative disc disease. No worrisome lytic or sclerotic lesions. IMPRESSION: 1. No evidence of metastatic disease. 2. Severe right hydronephrosis,  as on 03/09/2019, with evidence of decreased renal function. 3. Enlarged steatotic liver. 4. Cholelithiasis. 5. Left thyroid nodule is new or enlarged from the prior exam. Consider further evaluation with thyroid ultrasound. If patient is clinically hyperthyroid, consider nuclear medicine thyroid uptake and scan. 6. Grade 2 anterolisthesis of L5 on S1 with advanced secondary degenerative disc disease. Electronically Signed   By: Lorin Picket M.D.   On: 05/29/2019 14:48   US Thyroid  Result Date: 06/21/2019 CLINICAL DATA:  Incidental on CT. Thyroid nodules incidentally noted on chest CT. EXAM: THYROID ULTRASOUND TECHNIQUE: Ultrasound examination of the thyroid gland and adjacent soft tissues was performed. COMPARISON:  Chest CT-05/29/2019 FINDINGS: Parenchymal Echotexture: Moderately heterogenous Isthmus: Normal in size measures 0.4 cm in diameter Right lobe: Normal in size measuring 5.3 x 2.1 x 2.0 cm Left lobe: Normal in size measuring 5.3 x 2.2 x 2.3 cm _________________________________________________________ Estimated total number of nodules >/= 1 cm: 4 Number of spongiform nodules >/=  2 cm not described below (TR1): 0 Number of mixed cystic and solid nodules >/= 1.5 cm not described below (TR2): 0 _________________________________________________________ There is an approximate 1.4 x 1.3 x 0.7 cm minimally complex anechoic cyst within the left-sided thyroid isthmus (labeled 1), which does not meet imaging criteria to recommend percutaneous sampling or continued dedicated follow-up. _________________________________________________________ Nodule # 2: Location: Right; Mid Maximum size: 1.9 cm; Other 2 dimensions: 1.8 x 1.7 cm Composition: solid/almost completely solid (2) Echogenicity: isoechoic (1) Shape: not taller-than-wide (0) Margins: ill-defined (0) Echogenic foci: none (0) ACR TI-RADS total points: 3. ACR TI-RADS risk category: TR3 (3 points). ACR TI-RADS recommendations: *Given size (>/= 1.5 -  2.4 cm) and appearance, a follow-up ultrasound in 1 year should be considered based on TI-RADS criteria. _________________________________________________________ There is an approximately 1.1 x 0.9 x 0.8 cm isoechoic ill-defined nodule/pseudonodule within the inferior pole the right lobe of the thyroid (labeled 3), which does not meet imaging criteria to recommend percutaneous sampling or continued dedicated follow-up. _________________________________________________________ Nodule # 4: Location: Left; Mid - this nodule likely correlates with the nodule seen on preceding chest CT Maximum size: 3.4 cm; Other 2 dimensions: 2.1 x 1.8 cm Composition: mixed cystic and solid (  1) Echogenicity: isoechoic (1) Shape: not taller-than-wide (0) Margins: ill-defined (0) Echogenic foci: none (0) ACR TI-RADS total points: 2. ACR TI-RADS risk category: TR2 (2 points). ACR TI-RADS recommendations: This nodule does NOT meet TI-RADS criteria for biopsy or dedicated follow-up. _________________________________________________________ IMPRESSION: 1. Findings suggestive of multinodular goiter. 2. Nodule #2 meets imaging criteria to recommend a 1 year follow-up as clinically indicated. The above is in keeping with the ACR TI-RADS recommendations - J Am Coll Radiol 2017;14:587-595. Electronically Signed   By: Sandi Mariscal M.D.   On: 06/21/2019 08:51    Labs:  CBC: Recent Labs    11/28/18 0937 12/19/18 0945 03/30/19 0949 06/22/19 0821  WBC 3.6* 3.1* 4.2 6.3  HGB 11.0* 10.8* 12.9 12.4  HCT 33.5* 32.6* 39.3 36.4  PLT 175 137* 193 242    COAGS: Recent Labs    06/22/19 0821  INR 1.2    BMP: Recent Labs    03/30/19 0949 05/29/19 1247 06/05/19 1148 06/22/19 0821  NA 142 141 142 141  K 3.9 4.6 3.4* 3.5  CL 106 104 106 106  CO2 27 27 27 22   GLUCOSE 115* 95 106* 109*  BUN 14 18 19 15   CALCIUM 9.4 9.8 8.9 9.5  CREATININE 1.16* 1.24* 1.16* 1.17*  GFRNONAA 53* 49* 53* 52*  GFRAA >60 57* >60 >60    LIVER  FUNCTION TESTS: Recent Labs    11/28/18 0937 12/19/18 0945 03/30/19 0949 05/29/19 1247  BILITOT 0.4 0.7 0.4 0.6  AST 30 25 27 29   ALT 31 20 29 27   ALKPHOS 95 107 114 139*  PROT 7.2 7.0 7.4 7.6  ALBUMIN 3.6 3.3* 3.6 3.9    TUMOR MARKERS: No results for input(s): AFPTM, CEA, CA199, CHROMGRNA in the last 8760 hours.  Assessment and Plan:  58 y/o F with history of right nephrolithiasis, right ureteral stricture and right hydronephrosis s/p lithotripsy and ureteral stenting with Norma Colon who presents today for right PCN placement due to persistent hydronephrosis. She has planned follow up with urology next month for PCN management.   Patient has been NPO since 10 pm last night, she does not take blood thinning medications. Afebrile, WBC 6.3, hgb 12.4, plt 242, creatinine  1.17, INR 1.2.  Risks and benefits of right PCN placement was discussed with the patient including, but not limited to, infection, bleeding, significant bleeding causing loss or decrease in renal function or damage to adjacent structures.   All of the patient's questions were answered, patient is agreeable to proceed.  Consent signed and in chart.  Thank you for this interesting consult.  I greatly enjoyed meeting Norma Colon and look forward to participating in their care.  A copy of this report was sent to the requesting provider on this date.  Electronically Signed: Joaquim Nam, PA-C 06/22/2019, 9:26 AM   I spent a total of 30 Minutes in face to face in clinical consultation, greater than 50% of which was counseling/coordinating care for right PCN placement.

## 2019-06-22 NOTE — Procedures (Signed)
  Procedure: RIGHT perc nephrostomy placed 9f EBL:   minimal Complications:  none immediate  See full dictation in [facility].  Dillard Cannon MD Main # [phone] Pager  [phone]

## 2019-06-23 ENCOUNTER — Telehealth: Payer: Self-pay | Admitting: Gastroenterology

## 2019-06-23 NOTE — Telephone Encounter (Signed)
Please review previous message and advise-she will have a suprapubic catheter in place at her appt with you

## 2019-06-24 NOTE — Telephone Encounter (Signed)
No, perfectly fine to have a clinic appt with me with a suprapubic cath in place.

## 2019-06-26 NOTE — Telephone Encounter (Signed)
Called and spoke with patient-patient has requested that the appt scheduled on 07/12/2019 be cancelled and she will call back to the office to schedule her appt to come in and speak with Dr. Bryan Lemma prior to having a colonoscopy; patient also advised to call back to the office should questions/concerns arise; Patient verbalized understanding of information/instructions;

## 2019-06-28 ENCOUNTER — Other Ambulatory Visit: Payer: Self-pay

## 2019-06-28 ENCOUNTER — Ambulatory Visit (HOSPITAL_COMMUNITY)
Admission: RE | Admit: 2019-06-28 | Discharge: 2019-06-28 | Disposition: A | Discharge status: Home / Self Care | Payer: 59 | Source: Ambulatory Visit | Attending: Interventional Radiology | Admitting: Interventional Radiology

## 2019-06-28 ENCOUNTER — Other Ambulatory Visit (HOSPITAL_COMMUNITY): Payer: Self-pay | Admitting: Interventional Radiology

## 2019-06-28 DIAGNOSIS — Z436 Encounter for attention to other artificial openings of urinary tract: Secondary | ICD-10-CM

## 2019-06-28 HISTORY — PX: IR PATIENT EVAL TECH 0-60 MINS: IMG5564

## 2019-06-28 NOTE — Progress Notes (Signed)
Patient came in due to the plug at distal end of her right nephrostomy bag leaking. Bag changed and attached to leg with new straps.  Also changed dressing at patients request.  Advised her to call if bag leaked again, and continue directions given from MD.  She states she is supposed to have drain removed in late October. [place], RTR

## 2019-06-29 ENCOUNTER — Ambulatory Visit: Payer: 59 | Admitting: Gastroenterology

## 2019-06-30 ENCOUNTER — Encounter (HOSPITAL_COMMUNITY): Payer: Self-pay

## 2019-06-30 NOTE — Procedures (Signed)
Patient came in due to the plug at distal end of her right nephrostomy bag leaking. Bag changed and attached to leg with new straps.  Also changed dressing at patients request.  Advised her to call if bag leaked again, and continue directions given from MD.  She states she is supposed to have drain removed in late October. [place], RTR

## 2019-07-12 ENCOUNTER — Ambulatory Visit: Payer: 59 | Admitting: Gastroenterology

## 2019-07-26 NOTE — Patient Instructions (Signed)
DUE TO COVID-19 ONLY ONE VISITOR IS ALLOWED TO COME WITH YOU AND STAY IN THE WAITING ROOM ONLY DURING PRE OP AND PROCEDURE DAY OF SURGERY. THE 1 VISITOR MAY VISIT WITH YOU AFTER SURGERY IN YOUR PRIVATE ROOM DURING VISITING HOURS ONLY!  YOU NEED TO HAVE A COVID 19 TEST ON_______ @_______ , THIS TEST MUST BE DONE BEFORE SURGERY, COME  [address], [address] , [id number].  ([place]) ONCE YOUR COVID TEST IS COMPLETED, PLEASE BEGIN THE QUARANTINE INSTRUCTIONS AS OUTLINED IN YOUR HANDOUT.                Corrinn Sartwell  07/26/2019   Your procedure is scheduled on: 08-03-19   Report to [facility] Main  Entrance   Report to admitting at        0900 AM     Call this number if you have problems the morning of surgery [phone]                                     Remember: Drink one bottle of magnesium citrate by noon the day before surgery   Follow a clear liquid diet the day before surgery   Do not  drink liquids :After Midnight.    CLEAR LIQUID DIET   Foods Allowed                                                                     Foods Excluded  Coffee and tea, regular and decaf                             liquids that you cannot  Plain Jell-O any favor except red or purple                                           see through such as: Fruit ices (not with fruit pulp)                                     milk, soups, orange juice  Iced Popsicles                                    All solid food Carbonated beverages, regular and diet                                    Cranberry, grape and apple juices Sports drinks like Gatorade Lightly seasoned clear broth or consume(fat free) Sugar, honey syrup  Sample Menu Breakfast                                Lunch  Supper Cranberry juice                    Beef broth                            Chicken broth Jell-O                                     Grape juice                            Apple juice Coffee or tea                        Jell-O                                      Popsicle                                                Coffee or tea                        Coffee or tea  _____________________________________________________________________     BRUSH YOUR TEETH MORNING OF SURGERY AND RINSE YOUR MOUTH OUT, NO CHEWING GUM CANDY OR MINTS.     Take these medicines the morning of surgery with A SIP OF WATER: oxycodone if needed, wellbutrin, xanax if needed  DO NOT TAKE ANY DIABETIC MEDICATIONS DAY OF YOUR SURGERY                               You may not have any metal on your body including hair pins and              piercings  Do not wear jewelry, make-up, lotions, powders or perfumes, deodorant             Do not wear nail polish on your fingernails.  Do not shave  48 hours prior to surgery.     Do not bring valuables to the hospital. [place].  Contacts, dentures or bridgework may not be worn into surgery.               Please read over the following fact sheets you were given: _____________________________________________________________________         [facility] - Preparing for Surgery Before surgery, you can play an important role.  Because skin is not sterile, your skin needs to be as free of germs as possible.  You can reduce the number of germs on your skin by washing with CHG (chlorahexidine gluconate) soap before surgery.  CHG is an antiseptic cleaner which kills germs and bonds with the skin to continue killing germs even after washing. Please DO NOT use if you have an allergy to CHG or antibacterial soaps.  If your skin becomes reddened/irritated stop using the CHG and inform your nurse when you arrive at Short Stay. Do  not shave (including legs and underarms) for at least 48 hours prior to the first CHG shower.  You may shave your face/neck. Please follow these instructions  carefully:  1.  Shower with CHG Soap the night before surgery and the  morning of Surgery.  2.  If you choose to wash your hair, wash your hair first as usual with your  normal  shampoo.  3.  After you shampoo, rinse your hair and body thoroughly to remove the  shampoo.                           4.  Use CHG as you would any other liquid soap.  You can apply chg directly  to the skin and wash                       Gently with a scrungie or clean washcloth.  5.  Apply the CHG Soap to your body ONLY FROM THE NECK DOWN.   Do not use on face/ open                           Wound or open sores. Avoid contact with eyes, ears mouth and genitals (private parts).                       Wash face,  Genitals (private parts) with your normal soap.             6.  Wash thoroughly, paying special attention to the area where your surgery  will be performed.  7.  Thoroughly rinse your body with warm water from the neck down.  8.  DO NOT shower/wash with your normal soap after using and rinsing off  the CHG Soap.                9.  Pat yourself dry with a clean towel.            10.  Wear clean pajamas.            11.  Place clean sheets on your bed the night of your first shower and do not  sleep with pets. Day of Surgery : Do not apply any lotions/deodorants the morning of surgery.  Please wear clean clothes to the hospital/surgery center.  FAILURE TO FOLLOW THESE INSTRUCTIONS MAY RESULT IN THE CANCELLATION OF YOUR SURGERY PATIENT SIGNATURE_________________________________  NURSE SIGNATURE__________________________________  ________________________________________________________________________

## 2019-07-26 NOTE — Progress Notes (Signed)
PCP - Dian Queen Cardiologist -   Chest x-ray -  EKG - 12-14-18 epic CT Chest 05-29-19 epic Stress Test -  ECHO -  Cardiac Cath -   Sleep Study -  CPAP -   Fasting Blood Sugar -  Checks Blood Sugar _____ times a day  Blood Thinner Instructions: Aspirin Instructions: Last Dose:  Anesthesia review:   Patient denies shortness of breath, fever, cough and chest pain at PAT appointment   Patient verbalized understanding of instructions that were given to them at the PAT appointment. Patient was also instructed that they will need to review over the PAT instructions again at home before surgery.

## 2019-07-27 ENCOUNTER — Other Ambulatory Visit: Payer: Self-pay

## 2019-07-27 ENCOUNTER — Encounter (HOSPITAL_COMMUNITY)
Admission: RE | Admit: 2019-07-27 | Discharge: 2019-07-27 | Disposition: A | Discharge status: Home / Self Care | Payer: 59 | Source: Ambulatory Visit | Attending: Urology | Admitting: Urology

## 2019-07-27 ENCOUNTER — Encounter (HOSPITAL_COMMUNITY): Payer: Self-pay

## 2019-07-27 DIAGNOSIS — N3592 Unspecified urethral stricture, female: Secondary | ICD-10-CM | POA: Diagnosis not present

## 2019-07-27 DIAGNOSIS — Z01812 Encounter for preprocedural laboratory examination: Secondary | ICD-10-CM | POA: Diagnosis present

## 2019-07-27 HISTORY — DX: Sepsis, unspecified organism: A41.9

## 2019-07-27 LAB — BASIC METABOLIC PANEL
Anion gap: 8 (ref 5–15)
BUN: 14 mg/dL (ref 6–20)
CO2: 26 mmol/L (ref 22–32)
Calcium: 9.3 mg/dL (ref 8.9–10.3)
Chloride: 105 mmol/L (ref 98–111)
Creatinine, Ser: 0.93 mg/dL (ref 0.44–1.00)
GFR calc Af Amer: >60 mL/min (ref >60)
GFR calc non Af Amer: >60 mL/min (ref >60)
Glucose, Bld: 104 mg/dL — ABNORMAL HIGH (ref 70–99)
Potassium: 4 mmol/L (ref 3.5–5.1)
Sodium: 139 mmol/L (ref 135–145)

## 2019-07-27 LAB — CBC
HCT: 38.5 % (ref 36.0–46.0)
Hemoglobin: 12.3 g/dL (ref 12.0–15.0)
MCH: 30.8 pg (ref 26.0–34.0)
MCHC: 31.9 g/dL (ref 30.0–36.0)
MCV: 96.3 fL (ref 80.0–100.0)
Platelets: 231 10*3/uL (ref 150–400)
RBC: 4 MIL/uL (ref 3.87–5.11)
RDW: 12.6 % (ref 11.5–15.5)
WBC: 6.1 10*3/uL (ref 4.0–10.5)
nRBC: 0 % (ref 0.0–0.2)

## 2019-07-27 NOTE — Progress Notes (Addendum)
Spoke with Minna Merritts , RN  At [employer] at [employer] .  Appt. Made for 08-02-19 at 8:10 am for a surgery 08-03-19 . Assured  testing will be back in time for surgery.

## 2019-08-02 ENCOUNTER — Other Ambulatory Visit (HOSPITAL_COMMUNITY)
Admission: RE | Admit: 2019-08-02 | Discharge: 2019-08-02 | Disposition: A | Discharge status: Home / Self Care | Payer: 59 | Source: Ambulatory Visit | Attending: Urology | Admitting: Urology

## 2019-08-02 DIAGNOSIS — Z01812 Encounter for preprocedural laboratory examination: Secondary | ICD-10-CM | POA: Insufficient documentation

## 2019-08-02 DIAGNOSIS — Z20828 Contact with and (suspected) exposure to other viral communicable diseases: Secondary | ICD-10-CM | POA: Insufficient documentation

## 2019-08-02 LAB — SARS CORONAVIRUS 2 (TAT 6-24 HRS): SARS Coronavirus 2: NEGATIVE

## 2019-08-03 ENCOUNTER — Inpatient Hospital Stay (HOSPITAL_COMMUNITY): Payer: 59 | Admitting: Certified Registered"

## 2019-08-03 ENCOUNTER — Encounter (HOSPITAL_COMMUNITY): Payer: Self-pay | Admitting: *Deleted

## 2019-08-03 ENCOUNTER — Other Ambulatory Visit: Payer: Self-pay

## 2019-08-03 ENCOUNTER — Encounter (HOSPITAL_COMMUNITY)
Admission: RE | Disposition: A | Discharge status: Home / Self Care | Payer: Self-pay | Source: Home / Self Care | Attending: Urology

## 2019-08-03 ENCOUNTER — Inpatient Hospital Stay (HOSPITAL_COMMUNITY)
Admission: RE | Admit: 2019-08-03 | Discharge: 2019-08-05 | DRG: 655 | Disposition: A | Discharge status: Home / Self Care | Payer: 59 | Attending: Urology | Admitting: Urology

## 2019-08-03 DIAGNOSIS — Z825 Family history of asthma and other chronic lower respiratory diseases: Secondary | ICD-10-CM

## 2019-08-03 DIAGNOSIS — M1990 Unspecified osteoarthritis, unspecified site: Secondary | ICD-10-CM | POA: Diagnosis present

## 2019-08-03 DIAGNOSIS — N135 Crossing vessel and stricture of ureter without hydronephrosis: Secondary | ICD-10-CM | POA: Diagnosis present

## 2019-08-03 DIAGNOSIS — M419 Scoliosis, unspecified: Secondary | ICD-10-CM | POA: Diagnosis present

## 2019-08-03 DIAGNOSIS — Z20828 Contact with and (suspected) exposure to other viral communicable diseases: Secondary | ICD-10-CM | POA: Diagnosis present

## 2019-08-03 DIAGNOSIS — Z8261 Family history of arthritis: Secondary | ICD-10-CM | POA: Diagnosis not present

## 2019-08-03 DIAGNOSIS — Z885 Allergy status to narcotic agent status: Secondary | ICD-10-CM | POA: Diagnosis not present

## 2019-08-03 DIAGNOSIS — Z9221 Personal history of antineoplastic chemotherapy: Secondary | ICD-10-CM | POA: Diagnosis not present

## 2019-08-03 DIAGNOSIS — Z823 Family history of stroke: Secondary | ICD-10-CM

## 2019-08-03 DIAGNOSIS — K660 Peritoneal adhesions (postprocedural) (postinfection): Secondary | ICD-10-CM | POA: Diagnosis present

## 2019-08-03 DIAGNOSIS — Z923 Personal history of irradiation: Secondary | ICD-10-CM

## 2019-08-03 DIAGNOSIS — Z85048 Personal history of other malignant neoplasm of rectum, rectosigmoid junction, and anus: Secondary | ICD-10-CM | POA: Diagnosis not present

## 2019-08-03 DIAGNOSIS — G2581 Restless legs syndrome: Secondary | ICD-10-CM | POA: Diagnosis present

## 2019-08-03 DIAGNOSIS — Z800 Family history of malignant neoplasm of digestive organs: Secondary | ICD-10-CM

## 2019-08-03 DIAGNOSIS — G43909 Migraine, unspecified, not intractable, without status migrainosus: Secondary | ICD-10-CM | POA: Diagnosis present

## 2019-08-03 DIAGNOSIS — Z8249 Family history of ischemic heart disease and other diseases of the circulatory system: Secondary | ICD-10-CM | POA: Diagnosis not present

## 2019-08-03 HISTORY — PX: ROBOT ASSISTED PYELOPLASTY: SHX5143

## 2019-08-03 LAB — HEMOGLOBIN AND HEMATOCRIT, BLOOD
HCT: 38.1 % (ref 36.0–46.0)
Hemoglobin: 12.2 g/dL (ref 12.0–15.0)

## 2019-08-03 SURGERY — XI ROBOTIC ASSISTED PYELOPLASTY WITH STENT PLACEMENT
Anesthesia: General | Laterality: Right

## 2019-08-03 MED ORDER — HYDROMORPHONE HCL 1 MG/ML IJ SOLN
INTRAMUSCULAR | Status: AC
  Administered 2019-08-03: 16:00:00 0.25 mg via INTRAVENOUS
  Filled 2019-08-03: qty 1

## 2019-08-03 MED ORDER — SCOPOLAMINE 1 MG/3DAYS TD PT72: 1.0000 | MEDICATED_PATCH | TRANSDERMAL | Status: DC

## 2019-08-03 MED ORDER — MIDAZOLAM HCL 2 MG/2ML IJ SOLN
INTRAMUSCULAR | Status: AC
  Filled 2019-08-03: qty 2

## 2019-08-03 MED ORDER — LACTATED RINGERS IV SOLN
INTRAVENOUS | Status: DC
  Administered 2019-08-03 (×2): via INTRAVENOUS

## 2019-08-03 MED ORDER — FENTANYL CITRATE (PF) 250 MCG/5ML IJ SOLN
INTRAMUSCULAR | Status: AC
  Filled 2019-08-03: qty 5

## 2019-08-03 MED ORDER — ALPRAZOLAM 1 MG PO TABS
2.0000 mg | ORAL_TABLET | Freq: Every day | ORAL | Status: DC
  Administered 2019-08-04 – 2019-08-05 (×2): 2 mg via ORAL
  Filled 2019-08-03 (×2): qty 2

## 2019-08-03 MED ORDER — WATER FOR IRRIGATION, STERILE IR SOLN
Status: DC | PRN
  Administered 2019-08-03: 1000 mL

## 2019-08-03 MED ORDER — HYDROMORPHONE HCL 1 MG/ML IJ SOLN
0.2500 mg | INTRAMUSCULAR | Status: DC | PRN
  Administered 2019-08-03 (×2): 0.25 mg via INTRAVENOUS
  Administered 2019-08-03 (×4): 0.5 mg via INTRAVENOUS

## 2019-08-03 MED ORDER — SUGAMMADEX SODIUM 200 MG/2ML IV SOLN
INTRAVENOUS | Status: DC | PRN
  Administered 2019-08-03: 200 mg via INTRAVENOUS

## 2019-08-03 MED ORDER — PROPOFOL 10 MG/ML IV BOLUS
INTRAVENOUS | Status: DC | PRN
  Administered 2019-08-03: 150 mg via INTRAVENOUS

## 2019-08-03 MED ORDER — ACETAMINOPHEN 500 MG PO TABS
1000.0000 mg | ORAL_TABLET | Freq: Four times a day (QID) | ORAL | Status: AC
  Administered 2019-08-04 (×3): 1000 mg via ORAL
  Filled 2019-08-03 (×3): qty 2

## 2019-08-03 MED ORDER — PHENYLEPHRINE 40 MCG/ML (10ML) SYRINGE FOR IV PUSH (FOR BLOOD PRESSURE SUPPORT)
PREFILLED_SYRINGE | INTRAVENOUS | Status: DC | PRN
  Administered 2019-08-03 (×3): 120 ug via INTRAVENOUS
  Administered 2019-08-03: 40 ug via INTRAVENOUS
  Administered 2019-08-03: 120 ug via INTRAVENOUS

## 2019-08-03 MED ORDER — PHENYLEPHRINE 40 MCG/ML (10ML) SYRINGE FOR IV PUSH (FOR BLOOD PRESSURE SUPPORT)
PREFILLED_SYRINGE | INTRAVENOUS | Status: AC
  Filled 2019-08-03: qty 10

## 2019-08-03 MED ORDER — HYDROMORPHONE HCL 1 MG/ML IJ SOLN
INTRAMUSCULAR | Status: AC
  Administered 2019-08-03: 17:00:00 0.5 mg via INTRAVENOUS
  Filled 2019-08-03: qty 1

## 2019-08-03 MED ORDER — SODIUM CHLORIDE (PF) 0.9 % IJ SOLN
INTRAMUSCULAR | Status: DC | PRN
  Administered 2019-08-03: 20 mL

## 2019-08-03 MED ORDER — BELLADONNA ALKALOIDS-OPIUM 16.2-60 MG RE SUPP: 1.0000 | Freq: Four times a day (QID) | RECTAL | Status: DC | PRN

## 2019-08-03 MED ORDER — BUPIVACAINE LIPOSOME 1.3 % IJ SUSP
20.0000 mL | Freq: Once | INTRAMUSCULAR | Status: AC
  Administered 2019-08-03: 15:00:00 40 mL
  Filled 2019-08-03: qty 20

## 2019-08-03 MED ORDER — PHENYLEPHRINE HCL-NACL 10-0.9 MG/250ML-% IV SOLN
INTRAVENOUS | Status: DC | PRN
  Administered 2019-08-03: 50 ug/min via INTRAVENOUS

## 2019-08-03 MED ORDER — ROCURONIUM BROMIDE 10 MG/ML (PF) SYRINGE
PREFILLED_SYRINGE | INTRAVENOUS | Status: DC | PRN
  Administered 2019-08-03: 60 mg via INTRAVENOUS
  Administered 2019-08-03: 20 mg via INTRAVENOUS

## 2019-08-03 MED ORDER — DEXAMETHASONE SODIUM PHOSPHATE 10 MG/ML IJ SOLN
INTRAMUSCULAR | Status: DC | PRN
  Administered 2019-08-03: 8 mg via INTRAVENOUS

## 2019-08-03 MED ORDER — DEXAMETHASONE SODIUM PHOSPHATE 10 MG/ML IJ SOLN
INTRAMUSCULAR | Status: AC
  Filled 2019-08-03: qty 1

## 2019-08-03 MED ORDER — EPHEDRINE SULFATE-NACL 50-0.9 MG/10ML-% IV SOSY
PREFILLED_SYRINGE | INTRAVENOUS | Status: DC | PRN
  Administered 2019-08-03 (×3): 10 mg via INTRAVENOUS

## 2019-08-03 MED ORDER — LIDOCAINE 2% (20 MG/ML) 5 ML SYRINGE
INTRAMUSCULAR | Status: AC
  Filled 2019-08-03: qty 5

## 2019-08-03 MED ORDER — DOCUSATE SODIUM 100 MG PO CAPS
100.0000 mg | ORAL_CAPSULE | Freq: Two times a day (BID) | ORAL | Status: DC
  Administered 2019-08-04 – 2019-08-05 (×3): 100 mg via ORAL
  Filled 2019-08-03 (×4): qty 1

## 2019-08-03 MED ORDER — OXYCODONE HCL 5 MG PO TABS
5.0000 mg | ORAL_TABLET | ORAL | Status: DC | PRN
  Administered 2019-08-03 – 2019-08-05 (×7): 10 mg via ORAL
  Filled 2019-08-03 (×7): qty 2

## 2019-08-03 MED ORDER — METOCLOPRAMIDE HCL 5 MG/ML IJ SOLN: 10.0000 mg | Freq: Once | INTRAMUSCULAR | Status: DC | PRN

## 2019-08-03 MED ORDER — ONDANSETRON HCL 4 MG/2ML IJ SOLN
INTRAMUSCULAR | Status: AC
  Filled 2019-08-03: qty 2

## 2019-08-03 MED ORDER — SODIUM CHLORIDE (PF) 0.9 % IJ SOLN
INTRAMUSCULAR | Status: AC
  Filled 2019-08-03: qty 20

## 2019-08-03 MED ORDER — LACTATED RINGERS IR SOLN
Status: DC | PRN
  Administered 2019-08-03: 1000 mL

## 2019-08-03 MED ORDER — PROPOFOL 10 MG/ML IV BOLUS
INTRAVENOUS | Status: AC
  Filled 2019-08-03: qty 20

## 2019-08-03 MED ORDER — FENTANYL CITRATE (PF) 100 MCG/2ML IJ SOLN
INTRAMUSCULAR | Status: AC
  Filled 2019-08-03: qty 2

## 2019-08-03 MED ORDER — ROCURONIUM BROMIDE 10 MG/ML (PF) SYRINGE
PREFILLED_SYRINGE | INTRAVENOUS | Status: AC
  Filled 2019-08-03: qty 10

## 2019-08-03 MED ORDER — BUPROPION HCL ER (XL) 300 MG PO TB24
300.0000 mg | ORAL_TABLET | Freq: Every day | ORAL | Status: DC
  Administered 2019-08-04 – 2019-08-05 (×2): 300 mg via ORAL
  Filled 2019-08-03 (×2): qty 1

## 2019-08-03 MED ORDER — SODIUM CHLORIDE 0.9 % IR SOLN
Status: DC | PRN
  Administered 2019-08-03: 1000 mL via INTRAVESICAL

## 2019-08-03 MED ORDER — MEPERIDINE HCL 50 MG/ML IJ SOLN: 6.2500 mg | INTRAMUSCULAR | Status: DC | PRN

## 2019-08-03 MED ORDER — MIDAZOLAM HCL 2 MG/2ML IJ SOLN
INTRAMUSCULAR | Status: DC | PRN
  Administered 2019-08-03: 2 mg via INTRAVENOUS

## 2019-08-03 MED ORDER — CYCLOBENZAPRINE HCL 10 MG PO TABS
20.0000 mg | ORAL_TABLET | Freq: Every day | ORAL | Status: DC
  Administered 2019-08-03 – 2019-08-04 (×2): 20 mg via ORAL
  Filled 2019-08-03 (×2): qty 2

## 2019-08-03 MED ORDER — FENTANYL CITRATE (PF) 250 MCG/5ML IJ SOLN
INTRAMUSCULAR | Status: DC | PRN
  Administered 2019-08-03 (×3): 50 ug via INTRAVENOUS
  Administered 2019-08-03: 150 ug via INTRAVENOUS
  Administered 2019-08-03: 50 ug via INTRAVENOUS

## 2019-08-03 MED ORDER — PHENYLEPHRINE HCL (PRESSORS) 10 MG/ML IV SOLN
INTRAVENOUS | Status: AC
  Filled 2019-08-03: qty 1

## 2019-08-03 MED ORDER — CEFAZOLIN SODIUM-DEXTROSE 2-4 GM/100ML-% IV SOLN
2.0000 g | INTRAVENOUS | Status: AC
  Administered 2019-08-03: 12:00:00 2 g via INTRAVENOUS
  Filled 2019-08-03: qty 100

## 2019-08-03 MED ORDER — HYDROCODONE-ACETAMINOPHEN 10-325 MG PO TABS: 1.0000 | ORAL_TABLET | ORAL | 0 refills | Status: DC | PRN

## 2019-08-03 MED ORDER — HYDROMORPHONE HCL 1 MG/ML IJ SOLN: 0.2500 mg | INTRAMUSCULAR | Status: DC | PRN

## 2019-08-03 MED ORDER — ROPINIROLE HCL 0.25 MG PO TABS
0.2500 mg | ORAL_TABLET | Freq: Every day | ORAL | Status: DC
  Administered 2019-08-03 – 2019-08-04 (×2): 0.25 mg via ORAL
  Filled 2019-08-03 (×4): qty 1

## 2019-08-03 MED ORDER — HYDROMORPHONE HCL 1 MG/ML IJ SOLN
INTRAMUSCULAR | Status: AC
  Administered 2019-08-04: 01:00:00 1 mg via INTRAVENOUS
  Filled 2019-08-03: qty 1

## 2019-08-03 MED ORDER — HYDROMORPHONE HCL 1 MG/ML IJ SOLN
0.5000 mg | INTRAMUSCULAR | Status: DC | PRN
  Administered 2019-08-03 – 2019-08-04 (×4): 1 mg via INTRAVENOUS
  Filled 2019-08-03 (×4): qty 1

## 2019-08-03 MED ORDER — DIPHENHYDRAMINE HCL 12.5 MG/5ML PO ELIX: 12.5000 mg | ORAL_SOLUTION | Freq: Four times a day (QID) | ORAL | Status: DC | PRN

## 2019-08-03 MED ORDER — LIDOCAINE 2% (20 MG/ML) 5 ML SYRINGE
INTRAMUSCULAR | Status: DC | PRN
  Administered 2019-08-03: 80 mg via INTRAVENOUS

## 2019-08-03 MED ORDER — ONDANSETRON HCL 4 MG/2ML IJ SOLN
4.0000 mg | INTRAMUSCULAR | Status: DC | PRN
  Administered 2019-08-04: 11:00:00 4 mg via INTRAVENOUS
  Filled 2019-08-03: qty 2

## 2019-08-03 MED ORDER — DIPHENHYDRAMINE HCL 50 MG/ML IJ SOLN: 12.5000 mg | Freq: Four times a day (QID) | INTRAMUSCULAR | Status: DC | PRN

## 2019-08-03 MED ORDER — MAGNESIUM CITRATE PO SOLN
1.0000 | Freq: Once | ORAL | Status: DC
  Filled 2019-08-03: qty 296

## 2019-08-03 MED ORDER — DEXTROSE-NACL 5-0.45 % IV SOLN
INTRAVENOUS | Status: DC
  Administered 2019-08-03 – 2019-08-05 (×3): via INTRAVENOUS

## 2019-08-03 MED ORDER — ONDANSETRON HCL 4 MG/2ML IJ SOLN
INTRAMUSCULAR | Status: DC | PRN
  Administered 2019-08-03: 4 mg via INTRAVENOUS

## 2019-08-03 SURGICAL SUPPLY — 77 items
BAG URO CATCHER STRL LF (MISCELLANEOUS) ×3 IMPLANT
CATH INTERMIT  6FR 70CM (CATHETERS) ×1 IMPLANT
CHLORAPREP W/TINT 26 (MISCELLANEOUS) ×3 IMPLANT
CLIP VESOLOCK LG 6/CT PURPLE (CLIP) ×3 IMPLANT
CLIP VESOLOCK MED LG 6/CT (CLIP) ×3 IMPLANT
CLOTH BEACON ORANGE TIMEOUT ST (SAFETY) ×3 IMPLANT
COVER SURGICAL LIGHT HANDLE (MISCELLANEOUS) ×3 IMPLANT
COVER TIP SHEARS 8 DVNC (MISCELLANEOUS) ×1 IMPLANT
COVER TIP SHEARS 8MM DA VINCI (MISCELLANEOUS) ×2
COVER WAND RF STERILE (DRAPES) IMPLANT
DECANTER SPIKE VIAL GLASS SM (MISCELLANEOUS) ×3 IMPLANT
DERMABOND ADVANCED (GAUZE/BANDAGES/DRESSINGS) ×2
DERMABOND ADVANCED .7 DNX12 (GAUZE/BANDAGES/DRESSINGS) ×1 IMPLANT
DRAIN CHANNEL 15F RND FF 3/16 (WOUND CARE) IMPLANT
DRAPE ARM DVNC X/XI (DISPOSABLE) ×4 IMPLANT
DRAPE COLUMN DVNC XI (DISPOSABLE) ×1 IMPLANT
DRAPE DA VINCI XI ARM (DISPOSABLE) ×8
DRAPE DA VINCI XI COLUMN (DISPOSABLE) ×2
DRAPE INCISE IOBAN 66X45 STRL (DRAPES) ×5 IMPLANT
DRAPE SHEET LG 3/4 BI-LAMINATE (DRAPES) ×5 IMPLANT
DRAPE UTILITY XL STRL (DRAPES) ×2 IMPLANT
ELECT PENCIL ROCKER SW 15FT (MISCELLANEOUS) ×3 IMPLANT
ELECT REM PT RETURN 15FT ADLT (MISCELLANEOUS) ×3 IMPLANT
EVACUATOR SILICONE 100CC (DRAIN) IMPLANT
EXTRACTOR STONE NITINOL NGAGE (UROLOGICAL SUPPLIES) IMPLANT
FIBER LASER TRAC TIP (UROLOGICAL SUPPLIES) IMPLANT
GLOVE BIO SURGEON STRL SZ 6.5 (GLOVE) ×2 IMPLANT
GLOVE BIO SURGEON STRL SZ8 (GLOVE) ×6 IMPLANT
GLOVE BIO SURGEONS STRL SZ 6.5 (GLOVE) ×1
GLOVE BIOGEL PI IND STRL 8 (GLOVE) ×2 IMPLANT
GLOVE BIOGEL PI INDICATOR 8 (GLOVE) ×4
GOWN STRL REUS W/TWL LRG LVL3 (GOWN DISPOSABLE) ×3 IMPLANT
GOWN STRL REUS W/TWL XL LVL3 (GOWN DISPOSABLE) ×6 IMPLANT
GUIDEWIRE ANG ZIPWIRE 038X150 (WIRE) ×3 IMPLANT
GUIDEWIRE STR DUAL SENSOR (WIRE) IMPLANT
IRRIG SUCT STRYKERFLOW 2 WTIP (MISCELLANEOUS) ×6
IRRIGATION SUCT STRKRFLW 2 WTP (MISCELLANEOUS) ×1 IMPLANT
IV NS 1000ML (IV SOLUTION)
IV NS 1000ML BAXH (IV SOLUTION) ×1 IMPLANT
KIT BASIN OR (CUSTOM PROCEDURE TRAY) ×3 IMPLANT
KIT TURNOVER KIT A (KITS) IMPLANT
LOOP VESSEL MAXI BLUE (MISCELLANEOUS) ×2 IMPLANT
MANIFOLD NEPTUNE II (INSTRUMENTS) ×3 IMPLANT
MARKER SKIN DUAL TIP RULER LAB (MISCELLANEOUS) ×2 IMPLANT
NDL INSUFFLATION 14GA 120MM (NEEDLE) ×1 IMPLANT
NEEDLE INSUFFLATION 14GA 120MM (NEEDLE) ×3 IMPLANT
NS IRRIG 1000ML POUR BTL (IV SOLUTION) ×1 IMPLANT
PACK CYSTO (CUSTOM PROCEDURE TRAY) ×1 IMPLANT
PROTECTOR NERVE ULNAR (MISCELLANEOUS) ×6 IMPLANT
SEAL CANN UNIV 5-8 DVNC XI (MISCELLANEOUS) ×4 IMPLANT
SEAL XI 5MM-8MM UNIVERSAL (MISCELLANEOUS) ×8
SET IRRIG Y TYPE TUR BLADDER L (SET/KITS/TRAYS/PACK) ×2 IMPLANT
SET TUBE SMOKE EVAC HIGH FLOW (TUBING) ×3 IMPLANT
SHEATH URETERAL 12FRX35CM (MISCELLANEOUS) IMPLANT
SHEET LAVH (DRAPES) ×2 IMPLANT
SOLUTION ELECTROLUBE (MISCELLANEOUS) ×3 IMPLANT
STENT CONTOUR 6FRX26X.038 (STENTS) ×2 IMPLANT
STENT URET 6FRX26 CONTOUR (STENTS) IMPLANT
SUT ETHILON 3 0 PS 1 (SUTURE) IMPLANT
SUT MNCRL AB 4-0 PS2 18 (SUTURE) ×6 IMPLANT
SUT VIC AB 0 CT1 27 (SUTURE) ×4
SUT VIC AB 0 CT1 27XBRD ANTBC (SUTURE) IMPLANT
SUT VIC AB 4-0 RB1 27 (SUTURE) ×4
SUT VIC AB 4-0 RB1 27XBRD (SUTURE) IMPLANT
SUT VICRYL 0 UR6 27IN ABS (SUTURE) ×2 IMPLANT
SYR BULB IRRIGATION 50ML (SYRINGE) IMPLANT
TOWEL OR 17X26 10 PK STRL BLUE (TOWEL DISPOSABLE) ×3 IMPLANT
TOWEL OR NON WOVEN STRL DISP B (DISPOSABLE) ×3 IMPLANT
TRAY FOLEY MTR SLVR 16FR STAT (SET/KITS/TRAYS/PACK) ×1 IMPLANT
TRAY LAPAROSCOPIC (CUSTOM PROCEDURE TRAY) ×3 IMPLANT
TROCAR BLADELESS OPT 5 100 (ENDOMECHANICALS) ×2 IMPLANT
TROCAR XCEL 12X100 BLDLESS (ENDOMECHANICALS) ×3 IMPLANT
TUBE FEEDING 8FR 16IN STR KANG (MISCELLANEOUS) IMPLANT
TUBING CONNECTING 10 (TUBING) ×2 IMPLANT
TUBING CONNECTING 10' (TUBING) ×1
TUBING UROLOGY SET (TUBING) IMPLANT
WATER STERILE IRR 1000ML POUR (IV SOLUTION) ×3 IMPLANT

## 2019-08-03 NOTE — Op Note (Signed)
Preoperative diagnosis: Right distal ureteral stricture  Postop diagnosis: Same  Procedure: 1.  right robot assisted laparoscopic ureteral neocystotomy and Psoas Hitch 2.  Lysis of adhesions,moderate 3. rigtht 6x26 JJ ureteral stent placement   Attending: Nicolette Bang, MD  Assistant: Debbrah Alar, PA  Anesthesia: General  Estimated blood loss: 100 cc  Drains: 16 French Foley catheter, right 6x26 JJ ureteral stent  Specimens: none  Antibiotics: ancef  Findings: dense adhesions of ascending colon and small bowel to the anterior abdominal wall. Dense scarring of distal ureter at the level of the right iliac artery. Severe right hydroureteronephrosis to the level of the distal ureter. Right nephrostomy tube removed  Indications: Patient is a 58 year old with a history of right ureteral stricture who had failed conservative management.   After discussing treatment options patient decided to proceed with right robot assisted laparoscopic ureteral reimplant  Procedure in detail: Prior to procedure consent was obtained. Patient was brought to the operating room and briefing was done sure correct patient, correct procedure, correct site.  General anesthesia was in administered patient was placed in the left lateral decubitus position   Her genitalia was then prepped and draped int he usual sterile fashion. a 50 French catheter was then placed. their abdomen and flank was then prepped and draped usual sterile fashion.  A Veress needle was used to obtain pneumoperitoneum.  Once pneumoperitoneum was reestablished to 15 mmHg we then placed an 8 mm camera port above the umbilicus.  We then proceeded to place 3 more robotic ports. We then placed an assistant port in the right mid clavicular line. We then docked the robot.  We then started this dissection by removing a extensive amount of anterior abdominal wall adhesions likely from her previous surgeries.   We then reflected the small bowel and  colon medially.  We then identified the psoas muscle and the iliac vessels.  Once this was done we traced it down to the iliac vessels and identified the ureter.  We the dissected the ureter down to the pelvis an noted the ureter was scarred to the iliac artery. We then clipped the ureter at this point and then sharply cut it to free it from the right ovary. We then proceeded to dissect the anterior bladder wall and freed the bladder from its lateral and superior attachements. We attempted to perform a ureteroneocystotomy but due to adhesions we were unable to mobilize the bladder with the patient in the left lateral decubitus position. We then undocked the robot and repositioned the patient in dorsal lithotomy position. We then prepped and draped her abdomen. We placed 2 more 61mm robot ports in the left lower quadrant. We mobilized the bladder. The then proceed to perform a psoas hitch with 3 interrupted 2-0 Vicryls through the psoas tendon. We then draped the right ureter over the dome of the bladder to approximate where the incision needed to be made int he bladder. We then sharply incised the bladder at the dome. We then spatulated the ureter. The ureter was then attached to the bladder with 4-0 vicryls in an interrupted fashion. The anastomosis was then tested an no leak was seen. We then removed the left lateral roboti port and placed a JP drain into the pelvis. We then removed the remaining ports. We then closed the camera and assistant ports with 0 Vicryl in interrupted fashion. The skin was then closed with 4-0 Monocryl. Skin glue was then placed over the incision and this then concluded the procedure  which was well tolerated by the patient.  Complications: None  Condition: Stable, extubated, transferred to PACU.  Plan: Patient is to be admitted for inpatient stay. The foley catheter will be removed in 5 days. Stent removed in 6 weeks. They will be started on a clear liquid diet POD#1  .

## 2019-08-03 NOTE — Transfer of Care (Signed)
Immediate Anesthesia Transfer of Care Note  Patient: Norma Colon  Procedure(s) Performed: XI ROBOTIC ASSISTED PYELOPLASTY WITH STENT PLACEMENT (Right )  Patient Location: PACU  Anesthesia Type:General  Level of Consciousness: awake  Airway & Oxygen Therapy: Patient Spontanous Breathing and Patient connected to face mask oxygen  Post-op Assessment: Report given to RN, Post -op Vital signs reviewed and stable and Patient moving all extremities X 4  Post vital signs: Reviewed and stable  Last Vitals:  Vitals Value Taken Time  BP 105/91 08/03/19 1547  Temp    Pulse 76 08/03/19 1549  Resp 18 08/03/19 1549  SpO2 100 % 08/03/19 1549  Vitals shown include unvalidated device data.  Last Pain:  Vitals:   08/03/19 0853  TempSrc: Oral         Complications: No apparent anesthesia complications

## 2019-08-03 NOTE — Anesthesia Procedure Notes (Signed)
Procedure Name: Intubation Date/Time: 08/03/2019 11:39 AM Performed by: Niel Hummer, CRNA Pre-anesthesia Checklist: Patient identified, Emergency Drugs available, Suction available and Patient being monitored Patient Re-evaluated:Patient Re-evaluated prior to induction Oxygen Delivery Method: Circle system utilized Preoxygenation: Pre-oxygenation with 100% oxygen Induction Type: IV induction Ventilation: Mask ventilation without difficulty Laryngoscope Size: Mac and 4 Grade View: Grade I Tube type: Oral Tube size: 7.0 mm Number of attempts: 1 Airway Equipment and Method: Stylet Placement Confirmation: ETT inserted through vocal cords under direct vision,  positive ETCO2 and breath sounds checked- equal and bilateral Secured at: 22 cm Tube secured with: Tape Dental Injury: Teeth and Oropharynx as per pre-operative assessment

## 2019-08-03 NOTE — Discharge Instructions (Signed)

## 2019-08-03 NOTE — H&P (Signed)
Urology Admission H&P  Chief Complaint: right ureteral stricture  History of Present Illness: Ms Mcglathery is a 58yo with a hx of colon cancer s/p chemo, radiation and surgery who developed a right distal ureteral calculus that was impacted requiring ureteroscopic management. At the time of the surgery she was found to have a stricture a the level of the calculus. After stone removal she developed recurrence of her stricture requiring balloon dilation. The stricture recurred and we have elected to proceed with ureteral reimplant. She has a nephrostomy tube in place currently  Past Medical History:  Diagnosis Date  . 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 Anxiety   . Arthritis   . Chemotherapy induced neutropenia ([place])   . Depression   . Headache    migraines   . History of HPV infection   . History of kidney stones   . Hydronephrosis, right   . Restless leg syndrome   . Scoliosis   . Sepsis ([place])    11-2018   Past Surgical History:  Procedure Laterality Date  . COLPOSCOPY  04/04/2018  . CYSTOSCOPY WITH RETROGRADE PYELOGRAM, URETEROSCOPY AND STENT PLACEMENT Right 12/12/2018   Procedure: CYSTOSCOPY WITH RETROGRADE PYELOGRAM, URETEROSCOPY, STONE EXTRACTION AND STENT PLACEMENT;  Surgeon: Cleon Gustin, MD;  Location: [facility];  Service: Urology;  Laterality: Right;  . CYSTOSCOPY WITH RETROGRADE PYELOGRAM, URETEROSCOPY AND STENT PLACEMENT Right 04/17/2019   Procedure: CYSTOSCOPY WITH RETROGRADE PYELOGRAM, BALLOON [place]  URETEROSCOPY AND STENT PLACEMENT;  Surgeon: Cleon Gustin, MD;  Location: [facility];  Service: Urology;  Laterality: Right;  . CYSTOSCOPY WITH STENT PLACEMENT Right 11/21/2018   Procedure: CYSTOSCOPY, RIGHT  RETROGRADE PYELOGRAM, WITH RIGHT URETERAL STENT PLACEMENT;  Surgeon: Cleon Gustin, MD;  Location: [facility];  Service: Urology;  Laterality: Right;  . IR NEPHROSTOMY PLACEMENT RIGHT  06/22/2019  . IR PATIENT EVAL TECH 0-60 MINS  06/28/2019  . left forearm surgery due to calcium rock     . SHOULDER ARTHROSCOPY W/ ROTATOR CUFF REPAIR Right   . XI ROBOTIC ASSISTED LOWER ANTERIOR RESECTION N/A 09/16/2018   Procedure: XI ROBOTIC ASSISTED LOWER ANTERIOR RESECTION ERAS PATHWAY;  Surgeon: Leighton Ruff, MD;  Location: [facility];  Service: General;  Laterality: N/A;    Home Medications:  Current Facility-Administered Medications  Medication Dose Route Frequency Provider Last Rate Last Dose  . ceFAZolin (ANCEF) IVPB 2g/100 mL premix  2 g Intravenous 30 min Pre-Op Alyson Ingles Candee Furbish, MD      . lactated ringers infusion   Intravenous Continuous Nolon Nations, MD 50 mL/hr at 08/03/19 0914    . magnesium citrate solution 1 Bottle  1 Bottle Oral Once Jakita Dutkiewicz, Candee Furbish, MD      . scopolamine (TRANSDERM-SCOP) 1 MG/3DAYS 1.5 mg  1 patch Transdermal Q72H Josephine Igo, MD       Allergies:  Allergies  Allergen Reactions  . Codeine Other (See Comments)    Causes migraine--patient requests this be kept on allergy list    Family History  Problem Relation Age of Onset  . COPD Father        smoker  . Stomach cancer Father        spread to lungs and bones  . Arthritis Mother   . Heart attack Paternal Grandmother   .  Heart attack Paternal Grandfather   . Heart attack Maternal Grandmother   . Stroke Maternal Grandmother   . Colon cancer Neg Hx    Social History:  reports that she quit smoking about 10 months ago. Her smoking use included cigarettes. She has a 30.00 pack-year smoking history. She has never used smokeless tobacco. She reports current alcohol use. She reports that she does not use drugs.  Review of Systems  Genitourinary: Positive for flank pain.  All other systems reviewed and are  negative.   Physical Exam:  Vital signs in last 24 hours: Temp:  [98.3 F (36.8 C)] 98.3 F (36.8 C) (10/29 0853) Pulse Rate:  [108] 108 (10/29 0853) Resp:  [20] 20 (10/29 0853) BP: (130)/(82) 130/82 (10/29 0853) SpO2:  [98 %] 98 % (10/29 0853) Physical Exam  Constitutional: She is oriented to person, place, and time. She appears well-developed and well-nourished.  HENT:  Head: Normocephalic and atraumatic.  Eyes: Pupils are equal, round, and reactive to light. EOM are normal.  Neck: Normal range of motion. No thyromegaly present.  Cardiovascular: Normal rate and regular rhythm.  Respiratory: Effort normal. No respiratory distress.  GI: Soft. She exhibits no distension.  Musculoskeletal: Normal range of motion.        General: No edema.  Neurological: She is alert and oriented to person, place, and time.  Skin: Skin is warm and dry.  Psychiatric: She has a normal mood and affect. Her behavior is normal. Judgment and thought content normal.    Laboratory Data:  No results found for this or any previous visit (from the past 24 hour(s)). Recent Results (from the past 240 hour(s))  SARS CORONAVIRUS 2 (TAT 6-24 HRS) Nasopharyngeal Nasopharyngeal Swab     Status: None   Collection Time: 08/02/19  7:59 AM   Specimen: Nasopharyngeal Swab  Result Value Ref Range Status   SARS Coronavirus 2 NEGATIVE NEGATIVE Final    Comment: (NOTE) SARS-CoV-2 target nucleic acids are NOT DETECTED. The SARS-CoV-2 RNA is generally detectable in upper and lower respiratory specimens during the acute phase of infection. Negative results do not preclude SARS-CoV-2 infection, do not rule out co-infections with other pathogens, and should not be used as the sole basis for treatment or other patient management decisions. Negative results must be combined with clinical observations, patient history, and epidemiological information. The expected result is Negative. Fact Sheet for  Patients: SugarRoll.be Fact Sheet for Healthcare Providers: https://www.woods-mathews.com/ This test is not yet approved or cleared by the [place] FDA and  has been authorized for detection and/or diagnosis of SARS-CoV-2 by FDA under an Emergency Use Authorization (EUA). This EUA will remain  in effect (meaning this test can be used) for the duration of the COVID-19 declaration under Section 56 4(b)(1) of the Act, 21 U.S.C. section 360bbb-3(b)(1), unless the authorization is terminated or revoked sooner. Performed at [facility], [place] [address] [place],  [address]    Creatinine: Recent Labs    07/27/19 1404  CREATININE 0.93   Baseline Creatinine: 0.9  Impression/Assessment:  57yo with a right distal ureteral stricture  Plan:  The risks/benefits/alternatives to right robotic ureteral reimplant was explained to the patient and she understands and wishes to proceed with surgery  Nicolette Bang 08/03/2019, 10:43 AM

## 2019-08-03 NOTE — Anesthesia Postprocedure Evaluation (Signed)
Anesthesia Post Note  Patient: Norma Colon  Procedure(s) Performed: XI ROBOTIC ASSISTED PYELOPLASTY WITH STENT PLACEMENT (Right )     Patient location during evaluation: PACU Anesthesia Type: General Level of consciousness: awake and alert and oriented Pain management: pain level controlled Vital Signs Assessment: post-procedure vital signs reviewed and stable Respiratory status: spontaneous breathing, nonlabored ventilation and respiratory function stable Cardiovascular status: blood pressure returned to baseline and stable Postop Assessment: no apparent nausea or vomiting Anesthetic complications: no    Last Vitals:  Vitals:   08/03/19 0853  BP: 130/82  Pulse: (!) 108  Resp: 20  Temp: 36.8 C  SpO2: 98%    Last Pain:  Vitals:   08/03/19 0853  TempSrc: Oral                 Birdia Jaycox A.

## 2019-08-03 NOTE — Anesthesia Preprocedure Evaluation (Addendum)
Anesthesia Evaluation  Patient identified by MRN, date of birth, ID band Patient awake    Reviewed: Allergy & Precautions, NPO status , Patient's Chart, lab work & pertinent test results  Airway Mallampati: III  TM Distance: >3 FB Neck ROM: Full    Dental no notable dental hx. (+) Teeth Intact, Dental Advisory Given   Pulmonary neg pulmonary ROS, former smoker,    Pulmonary exam normal breath sounds clear to auscultation       Cardiovascular negative cardio ROS Normal cardiovascular exam Rhythm:Regular Rate:Normal     Neuro/Psych  Headaches, PSYCHIATRIC DISORDERS Anxiety Depression Restless legs syndrome    GI/Hepatic negative GI ROS, Neg liver ROS, Hx/o rectal Ca s/p RT, chemoRx and APR   Endo/Other  Obesity   Renal/GU Renal diseaseRight ureteral stricture Right hydronephrosis   negative genitourinary   Musculoskeletal  (+) Arthritis , Osteoarthritis,    Abdominal (+) + obese,   Peds  Hematology negative hematology ROS (+)   Anesthesia Other Findings   Reproductive/Obstetrics                           Anesthesia Physical Anesthesia Plan  ASA: II  Anesthesia Plan: General   Post-op Pain Management:    Induction: Intravenous  PONV Risk Score and Plan: 4 or greater and Midazolam, Ondansetron, Dexamethasone and Treatment may vary due to age or medical condition  Airway Management Planned: Oral ETT  Additional Equipment:   Intra-op Plan:   Post-operative Plan: Extubation in OR  Informed Consent: I have reviewed the patients History and Physical, chart, labs and discussed the procedure including the risks, benefits and alternatives for the proposed anesthesia with the patient or authorized representative who has indicated his/her understanding and acceptance.     Dental advisory given  Plan Discussed with: CRNA and Surgeon  Anesthesia Plan Comments:          Anesthesia Quick Evaluation

## 2019-08-03 NOTE — Plan of Care (Signed)
  Problem: Pain Managment: Goal: General experience of comfort will improve Outcome: Not Progressing   

## 2019-08-04 ENCOUNTER — Encounter (HOSPITAL_COMMUNITY): Payer: Self-pay | Admitting: Urology

## 2019-08-04 LAB — HEMOGLOBIN AND HEMATOCRIT, BLOOD
HCT: 36.8 % (ref 36.0–46.0)
Hemoglobin: 11.6 g/dL — ABNORMAL LOW (ref 12.0–15.0)

## 2019-08-04 LAB — BASIC METABOLIC PANEL
Anion gap: 9 (ref 5–15)
BUN: 18 mg/dL (ref 6–20)
CO2: 23 mmol/L (ref 22–32)
Calcium: 8.8 mg/dL — ABNORMAL LOW (ref 8.9–10.3)
Chloride: 104 mmol/L (ref 98–111)
Creatinine, Ser: 1.22 mg/dL — ABNORMAL HIGH (ref 0.44–1.00)
GFR calc Af Amer: 58 mL/min — ABNORMAL LOW (ref >60)
GFR calc non Af Amer: 50 mL/min — ABNORMAL LOW (ref >60)
Glucose, Bld: 174 mg/dL — ABNORMAL HIGH (ref 70–99)
Potassium: 4.5 mmol/L (ref 3.5–5.1)
Sodium: 136 mmol/L (ref 135–145)

## 2019-08-04 LAB — GLUCOSE, CAPILLARY: Glucose-Capillary: 108 mg/dL — ABNORMAL HIGH (ref 70–99)

## 2019-08-04 MED ORDER — CHLORHEXIDINE GLUCONATE CLOTH 2 % EX PADS
6.0000 | MEDICATED_PAD | Freq: Every day | CUTANEOUS | Status: DC
  Administered 2019-08-04 – 2019-08-05 (×2): 6 via TOPICAL

## 2019-08-04 MED ORDER — KETOROLAC TROMETHAMINE 30 MG/ML IJ SOLN
30.0000 mg | Freq: Three times a day (TID) | INTRAMUSCULAR | Status: DC
  Administered 2019-08-04 – 2019-08-05 (×3): 30 mg via INTRAVENOUS
  Filled 2019-08-04 (×3): qty 1

## 2019-08-04 NOTE — Progress Notes (Signed)
1 Day Post-Op Subjective: Patient reports poor pain control. She is complaining of bladder spasms. Negative flatus.   Objective: Vital signs in last 24 hours: Temp:  [97.6 F (36.4 C)-97.9 F (36.6 C)] 97.6 F (36.4 C) (10/30 1433) Pulse Rate:  [74-85] 81 (10/30 1433) Resp:  [16-17] 17 (10/30 1433) BP: (100-122)/(59-81) 117/69 (10/30 1433) SpO2:  [91 %-100 %] 98 % (10/30 1433) Weight:  [96.2 kg] 96.2 kg (10/29 1816)  Intake/Output from previous day: 10/29 0701 - 10/30 0700 In: 3235.5 [P.O.:720; I.V.:2415.5; IV Piggyback:100] Out: 1310 [Urine:700; Emesis/NG output:200; Drains:210; Blood:200] Intake/Output this shift: Total I/O In: 55 [Other:60] Out: -   Physical Exam:  General:alert, cooperative and appears stated age GI: soft, non tender, normal bowel sounds, no palpable masses, no organomegaly, no inguinal hernia Female genitalia: not done Extremities: extremities normal, atraumatic, no cyanosis or edema  Lab Results: Recent Labs    08/03/19 1553 08/04/19 0444  HGB 12.2 11.6*  HCT 38.1 36.8   BMET Recent Labs    08/04/19 0444  NA 136  K 4.5  CL 104  CO2 23  GLUCOSE 174*  BUN 18  CREATININE 1.22*  CALCIUM 8.8*   No results for input(s): LABPT, INR in the last 72 hours. No results for input(s): LABURIN in the last 72 hours. Results for orders placed or performed during the hospital encounter of 08/02/19  SARS CORONAVIRUS 2 (TAT 6-24 HRS) Nasopharyngeal Nasopharyngeal Swab     Status: None   Collection Time: 08/02/19  7:59 AM   Specimen: Nasopharyngeal Swab  Result Value Ref Range Status   SARS Coronavirus 2 NEGATIVE NEGATIVE Final    Comment: (NOTE) SARS-CoV-2 target nucleic acids are NOT DETECTED. The SARS-CoV-2 RNA is generally detectable in upper and lower respiratory specimens during the acute phase of infection. Negative results do not preclude SARS-CoV-2 infection, do not rule out co-infections with other pathogens, and should not be used as  the sole basis for treatment or other patient management decisions. Negative results must be combined with clinical observations, patient history, and epidemiological information. The expected result is Negative. Fact Sheet for Patients: SugarRoll.be Fact Sheet for Healthcare Providers: https://www.woods-mathews.com/ This test is not yet approved or cleared by the [place] FDA and  has been authorized for detection and/or diagnosis of SARS-CoV-2 by FDA under an Emergency Use Authorization (EUA). This EUA will remain  in effect (meaning this test can be used) for the duration of the COVID-19 declaration under Section 56 4(b)(1) of the Act, 21 U.S.C. section 360bbb-3(b)(1), unless the authorization is terminated or revoked sooner. Performed at [facility], [place] [address], [place], [place] [address]     Studies/Results: No results found.  Assessment/Plan: POD#1 robotic right ureterla reimplant with psoas hitch  1. Continue CLD 2. Ambulate with assistance 3. mirabegron 25mg  for bladder spasms 4. toradol 30mg  Q8 hours   LOS: 1 day   Nicolette Bang 08/04/2019, 6:15 PM

## 2019-08-04 NOTE — Plan of Care (Signed)
  Problem: Clinical Measurements: Goal: Will remain free from infection Outcome: Progressing   Problem: Activity: Goal: Risk for activity intolerance will decrease Outcome: Progressing   Problem: Nutrition: Goal: Adequate nutrition will be maintained Outcome: Progressing   Problem: Safety: Goal: Ability to remain free from injury will improve Outcome: Progressing   

## 2019-08-05 MED ORDER — KETOROLAC TROMETHAMINE 10 MG PO TABS: 10.0000 mg | ORAL_TABLET | Freq: Three times a day (TID) | ORAL | 0 refills | Status: DC | PRN

## 2019-08-05 MED ORDER — SENNOSIDES-DOCUSATE SODIUM 8.6-50 MG PO TABS: 1.0000 | ORAL_TABLET | Freq: Two times a day (BID) | ORAL | 0 refills | Status: DC

## 2019-08-05 MED ORDER — HYDROCODONE-ACETAMINOPHEN 10-325 MG PO TABS: 1.0000 | ORAL_TABLET | Freq: Four times a day (QID) | ORAL | 0 refills | Status: DC | PRN

## 2019-08-05 MED ORDER — NITROFURANTOIN MONOHYD MACRO 100 MG PO CAPS: 100.0000 mg | ORAL_CAPSULE | Freq: Two times a day (BID) | ORAL | 0 refills | Status: AC

## 2019-08-05 MED ORDER — ACETAMINOPHEN 500 MG PO TABS
1000.0000 mg | ORAL_TABLET | Freq: Once | ORAL | Status: AC
  Administered 2019-08-05: 1000 mg via ORAL
  Filled 2019-08-05: qty 2

## 2019-08-05 NOTE — Progress Notes (Signed)
Education provided to pt on how to care for foley and leg bag use at home. Pt successfully returned demonstration of catheter care. [name]

## 2019-08-05 NOTE — Discharge Summary (Signed)
Physician Discharge Summary  Patient ID: Earlie Gettys MRN: [id number] DOB/AGE: 06-Jun-1961 58 y.o.  Admit date: 08/03/2019 Discharge date: 08/05/2019  Admission Diagnoses: Right Ureteral Stricture  Discharge Diagnoses:  Active Problems:   Ureteral stricture   Discharged Condition: good  Hospital Course: Pt underwent RIGHT robotic ureteral reimplant for radiation / stone induced stricture on 08/03/19, the day af admission, without acute complications. ON POD 1 her diet was advanced and she began to ambulate. By the AM of POD 2 she is ambulatory, pain controlled on PO meds, JP removed as output scant, and felt to be adequate for discharge. She will keep her bladder catheter until follow up.   Consults: None  Significant Diagnostic Studies: labs: Cr 1.2, Hgb 11.6 10/30  Treatments: surgery: as per above  Discharge Exam: Blood pressure (!) 104/58, pulse 80, temperature 98 F (36.7 C), temperature source Oral, resp. rate 18, height 5\' 9"  (1.753 m), weight 96.2 kg, SpO2 94 %. General appearance: alert, cooperative and appears stated age Eyes: negative Nose: Nares normal. Septum midline. Mucosa normal. No drainage or sinus tenderness. Throat: lips, mucosa, and tongue normal; teeth and gums normal Neck: supple, symmetrical, trachea midline Back: symmetric, no curvature. ROM normal. No CVA tenderness. Resp: non-labored on room air.  Cardio: Nl rate GI: soft, non-tender; bowel sounds normal; no masses,  no organomegaly Pelvic: external genitalia normal and catheter in place with non-foul urine.  Extremities: extremities normal, atraumatic, no cyanosis or edema Skin: Skin color, texture, turgor normal. No rashes or lesions Lymph nodes: Cervical, supraclavicular, and axillary nodes normal. Neurologic: Grossly normal Incision/Wound: Recent surgical sites c/d/i, no henrias.   Disposition: Home    Follow-up Information    Karen Kays, NP On 08/10/2019.   Specialty: Nurse  Practitioner Why: at 1:45 Contact information: [address] [place] [address] [phone]           Signed: Alexis Frock 08/05/2019, 7:57 AM

## 2019-08-05 NOTE — Plan of Care (Signed)
  Problem: Activity: Goal: Risk for activity intolerance will decrease Outcome: Completed/Met   Problem: Coping: Goal: Level of anxiety will decrease Outcome: Completed/Met   Problem: Pain Managment: Goal: General experience of comfort will improve Outcome: Completed/Met   

## 2019-10-03 ENCOUNTER — Telehealth: Payer: Self-pay | Admitting: Oncology

## 2019-10-03 NOTE — Telephone Encounter (Signed)
Returned patient's phone call regarding rescheduling 12/31 appointment, per patient's request appointment has moved to 01/08.

## 2019-10-05 ENCOUNTER — Other Ambulatory Visit: Payer: 59

## 2019-10-05 ENCOUNTER — Ambulatory Visit: Payer: 59 | Admitting: Oncology

## 2019-10-11 ENCOUNTER — Telehealth: Payer: Self-pay | Admitting: Oncology

## 2019-10-11 ENCOUNTER — Telehealth: Payer: Self-pay | Admitting: *Deleted

## 2019-10-11 NOTE — Telephone Encounter (Signed)
Called to cancel 10/13/19 appointments due to snow forecast. Appointments canceled and message to scheduling for reschedule in 2-3 weeks.

## 2019-10-11 NOTE — Telephone Encounter (Signed)
Scheduled per 1/6 sch msg. Called and spoke with pt, confirmed 1/22 appt

## 2019-10-13 ENCOUNTER — Ambulatory Visit: Payer: 59 | Admitting: Oncology

## 2019-10-13 ENCOUNTER — Other Ambulatory Visit: Payer: 59

## 2019-10-27 ENCOUNTER — Telehealth: Payer: Self-pay

## 2019-10-27 ENCOUNTER — Other Ambulatory Visit: Payer: Self-pay

## 2019-10-27 ENCOUNTER — Inpatient Hospital Stay: Payer: 59 | Attending: Oncology | Admitting: Oncology

## 2019-10-27 ENCOUNTER — Inpatient Hospital Stay: Payer: 59

## 2019-10-27 VITALS — BP 139/87 | HR 96 | Temp 98.3°F | Resp 18 | Ht 69.0 in | Wt 216.3 lb

## 2019-10-27 DIAGNOSIS — Z9221 Personal history of antineoplastic chemotherapy: Secondary | ICD-10-CM | POA: Diagnosis not present

## 2019-10-27 DIAGNOSIS — M25551 Pain in right hip: Secondary | ICD-10-CM | POA: Diagnosis not present

## 2019-10-27 DIAGNOSIS — G43909 Migraine, unspecified, not intractable, without status migrainosus: Secondary | ICD-10-CM | POA: Insufficient documentation

## 2019-10-27 DIAGNOSIS — N1330 Unspecified hydronephrosis: Secondary | ICD-10-CM | POA: Insufficient documentation

## 2019-10-27 DIAGNOSIS — M545 Low back pain: Secondary | ICD-10-CM | POA: Insufficient documentation

## 2019-10-27 DIAGNOSIS — C20 Malignant neoplasm of rectum: Secondary | ICD-10-CM

## 2019-10-27 DIAGNOSIS — Z85048 Personal history of other malignant neoplasm of rectum, rectosigmoid junction, and anus: Secondary | ICD-10-CM | POA: Insufficient documentation

## 2019-10-27 DIAGNOSIS — R682 Dry mouth, unspecified: Secondary | ICD-10-CM | POA: Insufficient documentation

## 2019-10-27 DIAGNOSIS — R200 Anesthesia of skin: Secondary | ICD-10-CM | POA: Insufficient documentation

## 2019-10-27 DIAGNOSIS — Z923 Personal history of irradiation: Secondary | ICD-10-CM | POA: Diagnosis not present

## 2019-10-27 DIAGNOSIS — G2581 Restless legs syndrome: Secondary | ICD-10-CM | POA: Diagnosis not present

## 2019-10-27 DIAGNOSIS — Z720 Tobacco use: Secondary | ICD-10-CM | POA: Diagnosis not present

## 2019-10-27 DIAGNOSIS — E041 Nontoxic single thyroid nodule: Secondary | ICD-10-CM | POA: Diagnosis not present

## 2019-10-27 LAB — BASIC METABOLIC PANEL - CANCER CENTER ONLY
Anion gap: 9 (ref 5–15)
BUN: 16 mg/dL (ref 6–20)
CO2: 27 mmol/L (ref 22–32)
Calcium: 9.1 mg/dL (ref 8.9–10.3)
Chloride: 104 mmol/L (ref 98–111)
Creatinine: 1.05 mg/dL — ABNORMAL HIGH (ref 0.44–1.00)
GFR, Est AFR Am: >60 mL/min (ref >60)
GFR, Estimated: 59 mL/min — ABNORMAL LOW (ref >60)
Glucose, Bld: 114 mg/dL — ABNORMAL HIGH (ref 70–99)
Potassium: 3.8 mmol/L (ref 3.5–5.1)
Sodium: 140 mmol/L (ref 135–145)

## 2019-10-27 LAB — CEA (IN HOUSE-CHCC): CEA (CHCC-In House): 2.51 ng/mL (ref 0.00–5.00)

## 2019-10-27 NOTE — Progress Notes (Signed)
[facility] OFFICE PROGRESS NOTE   Diagnosis: Rectal cancer  INTERVAL HISTORY:   Norma Colon returns for scheduled visit.  She underwent surgical repair of the right ureteral stricture with a laparoscopic ureteral neocystotomy and placement of a right ureteral stent on 08/03/2019.  The stent was removed on 09/14/2019.  She saw Dr. Alyson Ingles yesterday had a renal ultrasound revealed no right hydronephrosis.  Norma Colon has multiple complaints including a dry mouth, pain at the right lower back and pain on the right hip region when laying on her right side.  She has occasional red discoloration of the stool.  She continues to have tingling in the feet.  No tingling in the hands.  No numbness.  Objective:  Vital signs in last 24 hours:  Blood pressure 139/87, pulse 96, temperature 98.3 F (36.8 C), temperature source Temporal, resp. rate 18, height 5' 9"  (1.753 m), weight 216 lb 4.8 oz (98.1 kg), SpO2 100 %.    HEENT: Neck without mass ,oral cavity without thrush or ulcers, the mucous membranes are moist Lymphatics: No cervical, supraclavicular, axillary, or inguinal nodes GI: No hepatosplenomegaly, no mass, nontender Vascular: No leg edema Musculoskeletal: The area of discomfort is at the right flank near a nephrostomy tube site, no mass or rash.  No spine tenderness.  No pain with motion of the right hip.   Lab Results:  Lab Results  Component Value Date   WBC 6.1 07/27/2019   HGB 11.6 (L) 08/04/2019   HCT 36.8 08/04/2019   MCV 96.3 07/27/2019   PLT 231 07/27/2019   NEUTROABS 3.1 03/30/2019    CMP  Lab Results  Component Value Date   NA 140 10/27/2019   K 3.8 10/27/2019   CL 104 10/27/2019   CO2 27 10/27/2019   GLUCOSE 114 (H) 10/27/2019   BUN 16 10/27/2019   CREATININE 1.05 (H) 10/27/2019   CALCIUM 9.1 10/27/2019   PROT 7.6 05/29/2019   ALBUMIN 3.9 05/29/2019   AST 29 05/29/2019   ALT 27 05/29/2019   ALKPHOS 139 (H) 05/29/2019   BILITOT 0.6  05/29/2019   GFRNONAA 59 (L) 10/27/2019   GFRAA >60 10/27/2019    Lab Results  Component Value Date   CEA1 2.04 05/29/2019    Medications: I have reviewed the patient's current medications.   Assessment/Plan: 1.  Rectal cancer ? 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 CTs 05/24/2018-mass beginning at proximal he 7.6 cm from the anal verge, "20 "perirectal lymph nodes, no evidence of metastatic disease ? Pelvic MRI 06/01/2018-tumor measured at 10.8 cm from the anal sphincter, T3b, N1-2 left perirectal lymph nodes, each measuring 5 mm ? Radiation and Xeloda initiated 06/13/2018, completed 07/20/2018 ? Low anterior resection 09/16/2018,ypT3,ypN1c, 1 cm tumor, partial response-score 2, 0/13 lymph nodes positive, 2 tumor deposits, MSI-stable, no loss of mismatch repair protein expression ? Cycle 1 CAPOX 10/17/2018 ? Cycle 2 CAPOX 11/07/2018 (oxaliplatin dose reduced due to neutropenia) ? Cycle 3 CAPOX 11/28/2018 ? Cycle 4 CAPOX 12/19/2018 ? CTs 05/29/2019-no evidence of metastatic disease, severe right hydronephrosis, left thyroid nodule 2. Tobacco use 3. 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 Alyson Ingles, urology 4. Migraine headaches 5. Restless legs 6. Arm pain during the  oxaliplatin infusion and for 1 week after cycle 1.  The oxaliplatin will be further diluted and infusion time extended getting with cycle 2. 7. Neutropenia secondary to chemotherapy.  Oxaliplatin dose reduced beginning with cycle 2. 8. 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 Right hydronephrosis  on CT 05/29/2019-referred to Dr. Alyson Ingles, surgical reimplantation of the right ureter on 08/03/2019, right ureter stent removed 09/14/2019 11. Thyroid nodules-new versus enlarged left thyroid nodule CT 05/29/2019, thyroid ultrasound 06/20/2019-multiple nodules, 1 right-sided nodule met criteria for ultrasound follow-up      Disposition: Norma Colon is in remission from rectal cancer.  We will follow up on the CEA from today.  She will return for an office visit, CEA, and restaging CT scans in early July.  She will continue follow-up with Dr. Alyson Ingles for management of the right hydronephrosis.  The mouth dryness may be related to her medical regimen.  She will discuss this with Dr. Helane Rima.  Ms. Eckart will schedule appointment with Dr. Bryan Lemma for a surveillance colonoscopy and evaluation of the rectal bleeding.  Betsy Coder, MD  10/27/2019  12:38 PM

## 2019-10-27 NOTE — Telephone Encounter (Signed)
-----   Message from Ladell Pier, MD sent at 10/27/2019  1:54 PM EST ----- Please call patient, CEA is normal, follow-up as scheduled

## 2019-10-27 NOTE — Telephone Encounter (Signed)
Left voice message for patient that CEA is normal per Dr. Benay Spice, follow up as scheduled.  Encouraged her to call back if she has any questions.

## 2019-10-30 ENCOUNTER — Telehealth: Payer: Self-pay | Admitting: Oncology

## 2019-10-30 NOTE — Telephone Encounter (Signed)
Scheduled per los. Called and left msg. Mailed printout  °

## 2019-11-09 ENCOUNTER — Other Ambulatory Visit: Payer: Self-pay

## 2019-11-09 ENCOUNTER — Ambulatory Visit (INDEPENDENT_AMBULATORY_CARE_PROVIDER_SITE_OTHER): Payer: 59 | Admitting: Gastroenterology

## 2019-11-09 ENCOUNTER — Encounter: Payer: Self-pay | Admitting: Gastroenterology

## 2019-11-09 VITALS — BP 118/76 | HR 78 | Temp 97.8°F | Ht 69.0 in | Wt 218.0 lb

## 2019-11-09 DIAGNOSIS — C20 Malignant neoplasm of rectum: Secondary | ICD-10-CM

## 2019-11-09 DIAGNOSIS — K5900 Constipation, unspecified: Secondary | ICD-10-CM | POA: Diagnosis not present

## 2019-11-09 DIAGNOSIS — Z01818 Encounter for other preprocedural examination: Secondary | ICD-10-CM | POA: Diagnosis not present

## 2019-11-09 DIAGNOSIS — K921 Melena: Secondary | ICD-10-CM | POA: Diagnosis not present

## 2019-11-09 DIAGNOSIS — K117 Disturbances of salivary secretion: Secondary | ICD-10-CM

## 2019-11-09 DIAGNOSIS — R140 Abdominal distension (gaseous): Secondary | ICD-10-CM

## 2019-11-09 MED ORDER — CLENPIQ 10-3.5-12 MG-GM -GM/160ML PO SOLN: 1.0000 | Freq: Once | ORAL | 0 refills | Status: AC

## 2019-11-09 NOTE — Progress Notes (Signed)
P  Chief Complaint:    Follow-up of colon cancer, constipation  GI History: 59 year old female with rectal cancer diagnosed on colonoscopy in 05/2018.  CT and MRI without metastatic disease.  Treated with radiation and Xeloda followed by low anterior resection on 09/16/2018 with 0/13 lymph nodes positive (MSI stable, no loss of MMR), then CAPOX which completed in 12/2018.  CT in 05/2019 without metastatic disease.  Additionally had right UPJ stone initially seen on CT in 05/2018, with migration on CT in 11/2018 with right hydroureteronephrosis requiring right JJ stent in 11/2018 followed by lithotripsy and stent replacement in 12/2018.  Right hydronephrosis on repeat CT in 05/2019 with surgical reimplantation of right ureter on 08/03/2019, right ureter stent removal on 09/14/2019.  Endoscopic history: -Colonoscopy (05/17/2018): 7 cm partially obstructing mass in the proximal rectum, located 10 cm from anal verge (rectal adenocarcinoma).  Fair prep -Colonoscopy (05/19/2018): Partial obstructing mass again noted in proximal rectum, tattoo placed.  Benign rectal polyps, otherwise normal.  Skin tags.  Normal TI  HPI:     Patient is a 59 y.o. female presenting to the [facility] for follow-up.  Last seen by me in 06/2018, and since then has undergone surgical resection of rectal adenocarcinoma along with chemoradiation as outlined above.  Today, her main issue is ongoing constipation along with generalized abdominal discomfort, bloating.  2 episodes of hematochezia described as a BRB on the outside of stool.  Reports a longstanding history of constipation, with baseline stools of 1 BM every 2 weeks or so for as long as she can remember.  Since surgery, has taken Dulcolax once/day and started MiraLAX 1 cap/day, but still with pellet-like stools every 1-2 days.  Does not drink any water.  Will only drink water when it is flavored with something like Crystal light.  Abdominal bloating with  generalized abdominal discomfort.  Intermittently feels that her upper abdomen is hard at times.  Additionally, reports persistent xerostomia without clear etiology.  Recent isolated episode of MEG/subxiphoid pain radiating to the back. Took muscle relaxer with resolution after about 20 minutes.  No recurrence.   Reviewed labs from 10/2018: Normal CEA at 2.5, creatinine 1.05 (improved) with otherwise normal BMP    Review of systems:     No chest pain, no SOB, no fevers, no urinary sx   Past Medical History:  Diagnosis Date  . Adenocarcinoma of rectum [facility]) oncologist-- dr Benay Spice--  per last note in epic ,  clinical remission   dx 08/ 2019---- chemoradiation concurrent completed 07-20-2018;  s/p  low anterior resection 09-16-2018;   chemo 10-17-2018  ot 12-19-2018  . Anxiety   . Arthritis   . Chemotherapy induced neutropenia ([place])   . Depression   . Headache    migraines   . History of HPV infection   . History of kidney stones   . Hydronephrosis, right   . Restless leg syndrome   . Scoliosis   . Sepsis ([place])    11-2018    Patient's surgical history, family medical history, social history, medications and allergies were all reviewed in Epic    Current Outpatient Medications  Medication Sig Dispense Refill  . alprazolam (XANAX) 2 MG tablet Take 2 mg by mouth daily after breakfast.     . buPROPion (WELLBUTRIN XL) 300 MG 24 hr tablet Take 300 mg by mouth daily after breakfast.     . cyclobenzaprine (FLEXERIL) 10 MG tablet Take 20 mg by mouth at bedtime.     [name]  HYDROcodone-acetaminophen (NORCO) 10-325 MG tablet Take 1-2 tablets by mouth every 6 (six) hours as needed for moderate pain or severe pain (headache). Post-Operatively. 20 tablet 0  . rOPINIRole (REQUIP) 0.25 MG tablet Take 0.25 mg by mouth at bedtime.    . bisacodyl (DULCOLAX) 5 MG EC tablet Take 5 mg by mouth daily.     No current facility-administered medications for this visit.    Physical Exam:     BP 118/76    Pulse 78   Temp 97.8 F (36.6 C)   Ht _0  (1.753 m)   Wt 218 lb (98.9 kg)   LMP  (LMP Unknown)   BMI 32.19 kg/m   GENERAL:  Pleasant female in NAD PSYCH: : Cooperative, normal affect EENT:  conjunctiva pink, mucous membranes moist, neck supple without masses CARDIAC:  RRR, no murmur heard, no peripheral edema PULM: Normal respiratory effort, lungs CTA bilaterally, no wheezing ABDOMEN: Minimal discomfort in upper abdomen, no rebound or guarding.  Nondistended, soft.  No obvious masses, no hepatomegaly,  normal bowel sounds SKIN:  turgor, no lesions seen Musculoskeletal:  Normal muscle tone, normal strength NEURO: Alert and oriented x 3, no focal neurologic deficits   IMPRESSION and PLAN:    1) Rectal Adenocarcinoma 2) Constipation 3) Hematochezia  -Plan for colonoscopy not only for surveillance after treatment of colon cancer, but to evaluate surgical site for luminal narrowing or evidence of bleeding. -Skin tags previously noted, and can evaluate for presence of hemorrhoids, ulcers, alternate etiology for bleeding at time of colonoscopy -Resume Dulcolax and MiraLAX for now.  Planning on expedited colonoscopy.  Depending on results, can titrate laxative therapy or potentially refer for ARM -Does not drink enough fluids throughout the day.  Strongly recommended drinking water, but she states she cannot tolerate plain water.  Okay for light flavoring, but goal is for at least 64 ounces of water/day  4) Xerostomia -Could be related to poor fluid intake.  Renal function otherwise seems pretty well-preserved.  Otherwise, could be 2/2 medication regimen  5) Bloating 6) Abdominal discomfort -Seems 2/2 underlying constipation.  Has gained approximately 50 pounds since cancer diagnosis -If colonoscopy largely unrevealing, plan for aggressive bowel regimen.  If still ongoing symptoms, can evaluate for upper GI pathology with EGD   The indications, risks, and benefits of colonoscopy were  explained to the patient in detail. Risks include but are not limited to bleeding, perforation, adverse reaction to medications, and cardiopulmonary compromise. Sequelae include but are not limited to the possibility of surgery, hospitalization, and mortality. The patient verbalized understanding and wished to proceed. All questions answered, referred to the scheduler and bowel prep ordered. Further recommendations pending results of the exam.          Dominic Pea Donice Alperin ,DO, FACG 11/09/2019, 11:17 AM

## 2019-11-09 NOTE — Patient Instructions (Addendum)
You have been scheduled for a colonoscopy. Please follow written instructions given to you at your visit today.  Please pick up your prep supplies at the pharmacy within the next 1-3 days. If you use inhalers (even only as needed), please bring them with you on the day of your procedure. Your physician has requested that you go to www.startemmi.com and enter the access code given to you at your visit today. This web site gives a general overview about your procedure. However, you should still follow specific instructions given to you by our office regarding your preparation for the procedure.  It was a pleasure to see you today!  Vito Cirigliano, D.O.  

## 2019-11-10 ENCOUNTER — Encounter: Payer: Self-pay | Admitting: Gastroenterology

## 2019-11-20 ENCOUNTER — Other Ambulatory Visit: Payer: Self-pay | Admitting: Gastroenterology

## 2019-11-20 ENCOUNTER — Ambulatory Visit
Admission: RE | Admit: 2019-11-20 | Discharge: 2019-11-20 | Disposition: A | Discharge status: Home / Self Care | Payer: 59 | Source: Ambulatory Visit | Attending: Oncology | Admitting: Oncology

## 2019-11-20 ENCOUNTER — Ambulatory Visit (INDEPENDENT_AMBULATORY_CARE_PROVIDER_SITE_OTHER): Payer: 59

## 2019-11-20 DIAGNOSIS — Z1159 Encounter for screening for other viral diseases: Secondary | ICD-10-CM

## 2019-11-20 DIAGNOSIS — C20 Malignant neoplasm of rectum: Secondary | ICD-10-CM

## 2019-11-21 LAB — SARS CORONAVIRUS 2 (TAT 6-24 HRS): SARS Coronavirus 2: NEGATIVE

## 2019-11-23 ENCOUNTER — Encounter: Payer: 59 | Admitting: Gastroenterology

## 2019-12-13 ENCOUNTER — Other Ambulatory Visit: Payer: Self-pay | Admitting: Gastroenterology

## 2019-12-13 ENCOUNTER — Other Ambulatory Visit: Payer: Self-pay

## 2019-12-13 ENCOUNTER — Ambulatory Visit (INDEPENDENT_AMBULATORY_CARE_PROVIDER_SITE_OTHER): Payer: 59

## 2019-12-13 DIAGNOSIS — Z1159 Encounter for screening for other viral diseases: Secondary | ICD-10-CM

## 2019-12-14 ENCOUNTER — Telehealth: Payer: Self-pay | Admitting: Gastroenterology

## 2019-12-14 LAB — SARS CORONAVIRUS 2 (TAT 6-24 HRS): SARS Coronavirus 2: NEGATIVE

## 2019-12-14 NOTE — Telephone Encounter (Signed)
Called and spoke to patient after consulting with charge nurse Jayme Cloud, RN.  Advised pt to follow instructions given for prep and start at original instructed time of 6:00.  Advised pt to call on-call number if she does not have a bowel movement 2 hours after beginning prep.  On-call number given to patient.  Patient voiced understanding.

## 2019-12-14 NOTE — Telephone Encounter (Signed)
Patient calling- last time she had colonoscopy done- prep didn't clean her out good- she is asking if she can start the prep earlier than 6:00 this time. Colonoscopy is tomorrow.

## 2019-12-15 ENCOUNTER — Other Ambulatory Visit: Payer: Self-pay

## 2019-12-15 ENCOUNTER — Ambulatory Visit (AMBULATORY_SURGERY_CENTER): Payer: 59 | Admitting: Gastroenterology

## 2019-12-15 ENCOUNTER — Encounter: Payer: Self-pay | Admitting: Gastroenterology

## 2019-12-15 VITALS — BP 125/78 | HR 80 | Temp 98.0°F | Resp 16 | Ht 69.0 in | Wt 218.0 lb

## 2019-12-15 DIAGNOSIS — Z85048 Personal history of other malignant neoplasm of rectum, rectosigmoid junction, and anus: Secondary | ICD-10-CM | POA: Diagnosis not present

## 2019-12-15 DIAGNOSIS — K5730 Diverticulosis of large intestine without perforation or abscess without bleeding: Secondary | ICD-10-CM

## 2019-12-15 DIAGNOSIS — C20 Malignant neoplasm of rectum: Secondary | ICD-10-CM

## 2019-12-15 DIAGNOSIS — K640 First degree hemorrhoids: Secondary | ICD-10-CM

## 2019-12-15 MED ORDER — SODIUM CHLORIDE 0.9 % IV SOLN: 500.0000 mL | INTRAVENOUS | Status: DC

## 2019-12-15 NOTE — Op Note (Signed)
[place] Patient Name: Norma Colon Procedure Date: 12/15/2019 2:26 PM MRN: [id number] Endoscopist: Gerrit Heck , MD Age: 59 Referring MD:  Date of Birth: Dec 17, 1960 Gender: Female Account #: [id number] Procedure:                Colonoscopy Indications:              High risk colon cancer surveillance: Personal                            history of colon cancer                           59 yo female with rectal CA diagnosed in 2019, s/p                            radiation, Xeloda then LAR followed by CAPOX,                            presents for surveillance. Additionally with                            long-standing history of chronic constipation. Medicines:                Monitored Anesthesia Care Procedure:                Pre-Anesthesia Assessment:                           - Prior to the procedure, a History and Physical                            was performed, and patient medications and                            allergies were reviewed. The patient's tolerance of                            previous anesthesia was also reviewed. The risks                            and benefits of the procedure and the sedation                            options and risks were discussed with the patient.                            All questions were answered, and informed consent                            was obtained. Prior Anticoagulants: The patient has                            taken no previous anticoagulant or antiplatelet  agents. ASA Grade Assessment: II - A patient with                            mild systemic disease. After reviewing the risks                            and benefits, the patient was deemed in                            satisfactory condition to undergo the procedure.                           After obtaining informed consent, the colonoscope                            was passed under direct vision. Throughout the                       procedure, the patient's blood pressure, pulse, and                            oxygen saturations were monitored continuously. The                            Colonoscope was introduced through the anus and                            advanced to the the terminal ileum. The colonoscopy                            was performed without difficulty. The patient                            tolerated the procedure well. The quality of the                            bowel preparation was adequate. The ileocecal                            valve, appendiceal orifice, and rectum were                            photographed. Scope In: 2:35:14 PM Scope Out: 2:53:53 PM Scope Withdrawal Time: 0 hours 15 minutes 27 seconds  Total Procedure Duration: 0 hours 18 minutes 39 seconds  Findings:                 The perianal and digital rectal examinations were                            normal.                           There was evidence of a prior end-to-end  colo-colonic anastomosis in the rectum with                            adjacent tattoo site. This was patent and was                            characterized by healthy appearing mucosa. The                            anastomosis was easily traversed. No residual                            adenomatous tissue noted.                           A single small-mouthed diverticulum was found in                            the ascending colon.                           The sigmoid colon, descending colon, transverse                            colon, ascending colon and cecum appeared normal                            and patent. No areas of mucosal inflammation or                            luminal narrowing.                           Non-bleeding internal hemorrhoids were found. The                            hemorrhoids were small.                           Retroflexion in the rectum was not performed due to                             anatomy.                           The terminal ileum appeared normal. Complications:            No immediate complications. Estimated Blood Loss:     Estimated blood loss: none. Impression:               - Patent end-to-end colo-colonic anastomosis,                            characterized by healthy appearing mucosa.                           - Diverticulosis in the ascending colon.                           -  The sigmoid colon, descending colon, transverse                            colon, ascending colon and cecum are normal.                           - Non-bleeding internal hemorrhoids.                           - The examined portion of the ileum was normal.                           - No specimens collected. Recommendation:           - Patient has a contact number available for                            emergencies. The signs and symptoms of potential                            delayed complications were discussed with the                            patient. Return to normal activities tomorrow.                            Written discharge instructions were provided to the                            patient.                           - Resume previous diet.                           - Continue present medications.                           - Return to GI clinic at appointment to be                            scheduled.                           - Increase daily fluids to at least 64 oz of water                            (flavored ok) per day.                           - Send for Sitz marker study to evaluate for issues                            with delayed colonic transit. Gerrit Heck, MD 12/15/2019 3:07:02 PM

## 2019-12-15 NOTE — Progress Notes (Signed)
PT taken to PACU. Monitors in place. VSS. Report given to RN. 

## 2019-12-15 NOTE — Patient Instructions (Addendum)
YOU HAD AN ENDOSCOPIC PROCEDURE TODAY AT [facility]:   Refer to the procedure report that was given to you for any specific questions about what was found during the examination.  If the procedure report does not answer your questions, please call your gastroenterologist to clarify.  If you requested that your care partner not be given the details of your procedure findings, then the procedure report has been included in a sealed envelope for you to review at your convenience later.  YOU SHOULD EXPECT: Some feelings of bloating in the abdomen. Passage of more gas than usual.  Walking can help get rid of the air that was put into your GI tract during the procedure and reduce the bloating. If you had a lower endoscopy (such as a colonoscopy or flexible sigmoidoscopy) you may notice spotting of blood in your stool or on the toilet paper. If you underwent a bowel prep for your procedure, you may not have a normal bowel movement for a few days.  Please Note:  You might notice some irritation and congestion in your nose or some drainage.  This is from the oxygen used during your procedure.  There is no need for concern and it should clear up in a day or so.  SYMPTOMS TO REPORT IMMEDIATELY:   Following lower endoscopy (colonoscopy or flexible sigmoidoscopy):  Excessive amounts of blood in the stool  Significant tenderness or worsening of abdominal pains  Swelling of the abdomen that is new, acute  Fever of 100F or higher   For urgent or emergent issues, a gastroenterologist can be reached at any hour by calling [phone]. Do not use MyChart messaging for urgent concerns.    DIET:  We do recommend a small meal at first, but then you may proceed to your regular diet.  Drink plenty of fluids but you should avoid alcoholic beverages for 24 hours. Increase daily fluids to at least 64 ounces of water (flavored water is ok) per day.  FOLLOW UP: Dr. Bryan Lemma will send you for a Sitz  marker study to evaluate for issues with delayed colonic transit.                          Repeat colonoscopy in 3 years.                         Follow up with Dr. Bryan Lemma in his office at appointment to be scheduled. His office nurse will call you to schedule this appointment.  MEDICATIONS: Continue present medications.  Please see handouts given to you by your recovery nurse.  ACTIVITY:  You should plan to take it easy for the rest of today and you should NOT DRIVE or use heavy machinery until tomorrow (because of the sedation medicines used during the test).    FOLLOW UP: Our staff will call the number listed on your records 48-72 hours following your procedure to check on you and address any questions or concerns that you may have regarding the information given to you following your procedure. If we do not reach you, we will leave a message.  We will attempt to reach you two times.  During this call, we will ask if you have developed any symptoms of COVID 19. If you develop any symptoms (ie: fever, flu-like symptoms, shortness of breath, cough etc.) before then, please call [phone].  If you test positive for Covid 19 in the  2 weeks post procedure, please call and report this information to [place].    If any biopsies were taken you will be contacted by phone or by letter within the next 1-3 weeks.  Please call us at [phone] if you have not heard about the biopsies in 3 weeks.   Thank you for allowing [place] to provide for your healthcare needs today.   SIGNATURES/CONFIDENTIALITY: You and/or your care partner have signed paperwork which will be entered into your electronic medical record.  These signatures attest to the fact that that the information above on your After Visit Summary has been reviewed and is understood.  Full responsibility of the confidentiality of this discharge information lies with you and/or your care-partner.

## 2019-12-19 ENCOUNTER — Telehealth: Payer: Self-pay | Admitting: *Deleted

## 2019-12-19 ENCOUNTER — Telehealth: Payer: Self-pay

## 2019-12-19 DIAGNOSIS — K921 Melena: Secondary | ICD-10-CM

## 2019-12-19 DIAGNOSIS — K5900 Constipation, unspecified: Secondary | ICD-10-CM

## 2019-12-19 NOTE — Telephone Encounter (Signed)
Called and spoke with patient- Per 12/15/2019 procedure report: patient has been scheduled for a f.u OV on 01/16/2020 at 3:00 pm at the [facility] office;   Patient has also agreed to have sitz maker study on 12/25/2019 at the [facility] office at 11:00 am followed by the abd KUB on 12/29/2019 at 11:00 am at the [facility] office;   Patient advised to call back to the office at [phone] should questions/concerns arise;  Patient verbalized understanding of information/instructions;

## 2019-12-19 NOTE — Telephone Encounter (Signed)
LVM

## 2019-12-19 NOTE — Telephone Encounter (Signed)
  Follow up Call-  Call back number 12/15/2019 05/19/2018 05/17/2018  Post procedure Call Back phone  # [phone] HIPPA  Permission to leave phone message Yes Yes Yes  Some recent data might be hidden     Patient questions:  Do you have a fever, pain , or abdominal swelling? No. Pain Score  0 *  Have you tolerated food without any problems? Yes.    Have you been able to return to your normal activities? Yes.    Do you have any questions about your discharge instructions: Diet   No. Medications  No. Follow up visit  No.  Do you have questions or concerns about your Care? No.  Actions: * If pain score is 4 or above: No action needed, pain <4.   1. Have you developed a fever since your procedure? no  2.   Have you had an respiratory symptoms (SOB or cough) since your procedure? no  3.   Have you tested positive for COVID 19 since your procedure no  4.   Have you had any family members/close contacts diagnosed with the COVID 19 since your procedure?  no   If yes to any of these questions please route to Joylene John, RN and Alphonsa Gin, Therapist, sports.

## 2019-12-25 ENCOUNTER — Telehealth: Payer: Self-pay | Admitting: Gastroenterology

## 2019-12-25 NOTE — Telephone Encounter (Signed)
Called and spoke with patient- patient advised of sitz marker capsule ingestion date being changed per patient request due to having consumed laxatives; patient is to now come to the [place] office on 12/28/2019 for sitz capsule and then come to the [place] office for KUB xray in the basement on 01/02/2020;  Patient advised to call back to the office at [phone] should questions/concerns arise;  Patient verbalized understanding of information/instructions;

## 2019-12-25 NOTE — Telephone Encounter (Signed)
Pt stated that she is "supposed to come in today to swallow a pill and has lost instructions."  Please call back to discuss.

## 2019-12-28 NOTE — Telephone Encounter (Signed)
Patient came to [facility] office this am (11:00 am) and consumed the sitz marker capsule; patient verbalized understanding of need for xray 5 days from now-01/02/2020 (at the same time of day) to the [facility] office-radiology;  Patient advised to call back to the office at [phone] should questions/concerns arise; Patient verbalized understanding of information/instructions; xray order for sitz marker has already been placed in Epic;

## 2020-01-02 ENCOUNTER — Other Ambulatory Visit: Payer: Self-pay

## 2020-01-02 ENCOUNTER — Ambulatory Visit (INDEPENDENT_AMBULATORY_CARE_PROVIDER_SITE_OTHER)
Admission: RE | Admit: 2020-01-02 | Discharge: 2020-01-02 | Disposition: A | Discharge status: Home / Self Care | Payer: 59 | Source: Ambulatory Visit | Attending: Gastroenterology | Admitting: Gastroenterology

## 2020-01-02 DIAGNOSIS — K5900 Constipation, unspecified: Secondary | ICD-10-CM | POA: Diagnosis not present

## 2020-01-02 DIAGNOSIS — K921 Melena: Secondary | ICD-10-CM

## 2020-01-16 ENCOUNTER — Encounter: Payer: Self-pay | Admitting: Gastroenterology

## 2020-01-16 ENCOUNTER — Ambulatory Visit (INDEPENDENT_AMBULATORY_CARE_PROVIDER_SITE_OTHER): Payer: 59 | Admitting: Gastroenterology

## 2020-01-16 ENCOUNTER — Other Ambulatory Visit: Payer: Self-pay

## 2020-01-16 VITALS — BP 122/66 | HR 93 | Temp 97.3°F | Ht 69.0 in | Wt 213.0 lb

## 2020-01-16 DIAGNOSIS — K640 First degree hemorrhoids: Secondary | ICD-10-CM | POA: Diagnosis not present

## 2020-01-16 DIAGNOSIS — K5900 Constipation, unspecified: Secondary | ICD-10-CM | POA: Diagnosis not present

## 2020-01-16 DIAGNOSIS — K117 Disturbances of salivary secretion: Secondary | ICD-10-CM

## 2020-01-16 DIAGNOSIS — Z85038 Personal history of other malignant neoplasm of large intestine: Secondary | ICD-10-CM | POA: Diagnosis not present

## 2020-01-16 DIAGNOSIS — K921 Melena: Secondary | ICD-10-CM | POA: Diagnosis not present

## 2020-01-16 DIAGNOSIS — R140 Abdominal distension (gaseous): Secondary | ICD-10-CM

## 2020-01-16 NOTE — Progress Notes (Signed)
P  Chief Complaint:    Constipation, history of colon cancer, procedure follow-up, radiology follow-up  GI History: 59 year old female with rectal cancer diagnosed on colonoscopy in 05/2018.  CT and MRI without metastatic disease.  Treated with radiation and Xeloda followed by low anterior resection on 09/16/2018 with 0/13 lymph nodes positive (MSI stable, no loss of MMR), then CAPOX which completed in 12/2018.  CT in 05/2019 without metastatic disease.  Additionally had right UPJ stone initially seen on CT in 05/2018, with migration on CT in 11/2018 with right hydroureteronephrosis requiring right JJ stent in 11/2018 followed by lithotripsy and stent replacement in 12/2018.  Right hydronephrosis on repeat CT in 05/2019 with surgical reimplantation of right ureter on 08/03/2019, right ureter stent removal on 09/14/2019.  Endoscopic history: -Colonoscopy (05/17/2018): 7 cm partially obstructing mass in the proximal rectum, located 10 cm from anal verge (rectal adenocarcinoma).  Fair prep -Colonoscopy (05/19/2018): Partial obstructing mass again noted in proximal rectum, tattoo placed.  Benign rectal polyps, otherwise normal.  Skin tags.  Normal TI -Colonoscopy (12/15/2019): Patent healthy-appearing anastomosis, single diverticulum in the ascending colon, internal hemorrhoids, otherwise normal  HPI:     Patient is a 59 y.o. female presenting to the Gastroenterology Clinic for follow-up.  Last seen by me on 11/09/2019.  Main issue at that time was constipation and generalized abdominal discomfort/bloating.  Constipation 2/2 poor fluid intake (does not drink any water).  Was taking Dulcolax once/day and recommended to start MiraLAX 1 cap/day.   - Subsequent colonoscopy on 12/15/2019 as outlined above without luminal etiology and a patent colocolonic anastomosis. - Sitz marker study on 01/02/2020: + with 23 markers in the left colon, scattered throughout the descending/sigmoid/rectosigmoid most consistent  with constipation due to delayed colonic transit.  Recommended drinking 64 ounces of fluid/day, add fiber supplement, with consideration for trial Linzess or Trulance  Today, still with constipation with 2 BM/week.  Continues to take Dulcolax.  Has not trialed MiraLAX.  Has started drinking fluids and is up to drinking 48 ounces of flavored water/day.  Otherwise good p.o. intake.  Still with xerostomia.   Review of systems:     No chest pain, no SOB, no fevers, no urinary sx   Past Medical History:  Diagnosis Date  . 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 Anxiety   . Arthritis   . Chemotherapy induced neutropenia ([place])   . Depression   . Headache    migraines   . History of HPV infection   . History of kidney stones   . Hydronephrosis, right   . Restless leg syndrome   . Scoliosis   . Sepsis ([place])    11-2018    Patient's surgical history, family medical history, social history, medications and allergies were all reviewed in Epic    Current Outpatient Medications  Medication Sig Dispense Refill  . alprazolam (XANAX) 2 MG tablet Take 2 mg by mouth daily after breakfast.     . buPROPion (WELLBUTRIN XL) 300 MG 24 hr tablet Take 300 mg by mouth daily after breakfast.     . cyclobenzaprine (FLEXERIL) 10 MG tablet Take 20 mg by mouth at bedtime.     [name] HYDROcodone-acetaminophen (NORCO) 10-325 MG tablet Take 1-2 tablets by mouth every 6 (six) hours as needed for  moderate pain or severe pain (headache). Post-Operatively. 20 tablet 0  . rOPINIRole (REQUIP) 0.25 MG tablet Take 0.25 mg by mouth at bedtime.     Current Facility-Administered Medications  Medication Dose Route Frequency Provider Last Rate Last Admin  . 0.9 %  sodium chloride infusion  500 mL Intravenous Continuous  Tarica Harl V, DO        Physical Exam:     Temp (!) 97.3 F (36.3 C)   Ht 5' 9"  (1.753 m)   Wt 213 lb (96.6 kg)   LMP  (LMP Unknown)   BMI 31.45 kg/m   GENERAL:  Pleasant female in NAD PSYCH: : Cooperative, normal affect NEURO: Alert and oriented x 3, no focal neurologic deficits   IMPRESSION and PLAN:    1) Constipation Chronic constipation with baseline stools of 1 BM every 2 weeks or so for as long as she can remember, now seemingly more symptomatic over the last year or so. Sitz study with 23 retained markers in the left colon c/w slow transit constipation.  - Resume Dulcolax - Resume fiber supplement -Currently up to 48 oz of fluids/day now. Goal 64 oz/day - Mirlalax 1 cap/day.  Can titrate up to 1 bid if needed -Depending on response to therapy, can trial course of Linzess or Trulance -Depending on response to therapy, could consider additional evaluation with ARM to evaluate for concomitant Pelvic Floor Dyssynergia, but symptomatology and x-ray seems more consistent with slow transit  2) History of rectal Adenocarcinoma -Repeat colonoscopy in 3 years (2024) for ongoing surveillance  3) Hematochezia 4) Internal hemorrhoids -Small internal hemorrhoids noted on colonoscopy.  Intermittent small-volume BRBPR.  Likely all related to underlying constipation.  Will treat as above.  5) Bloating 6) Abdominal discomfort -Related to underlying constipation.  Treating as above.  Additionally, had gained proximately 50 pounds since cancer diagnosis.  Slowly working on that currently. -If symptoms persist despite aggressive bowel treatment as above, can consider EGD to rule out additional UGI pathology  RTC in 6 months or sooner as needed  I spent 30 minutes of time, including in depth chart review, independent review of results as outlined above, communicating results with the patient directly, face-to-face time with the patient, coordinating care, and ordering studies and  medications as appropriate, and documentation.           Lavena Bullion ,DO, FACG 01/16/2020, 3:08 PM

## 2020-01-16 NOTE — Patient Instructions (Signed)
Please purchase the following medications over the counter and take as directed: Miralax take 1 capful daily  It was a pleasure to see you today!  Vito Cirigliano, D.O.

## 2020-04-02 ENCOUNTER — Other Ambulatory Visit: Payer: Self-pay

## 2020-04-02 DIAGNOSIS — N135 Crossing vessel and stricture of ureter without hydronephrosis: Secondary | ICD-10-CM

## 2020-04-09 ENCOUNTER — Other Ambulatory Visit: Payer: 59

## 2020-04-11 ENCOUNTER — Ambulatory Visit
Admission: RE | Admit: 2020-04-11 | Discharge: 2020-04-11 | Disposition: A | Discharge status: Home / Self Care | Payer: 59 | Source: Ambulatory Visit | Attending: Oncology | Admitting: Oncology

## 2020-04-11 ENCOUNTER — Inpatient Hospital Stay: Payer: 59 | Attending: Oncology

## 2020-04-11 ENCOUNTER — Other Ambulatory Visit: Payer: Self-pay

## 2020-04-11 DIAGNOSIS — G2581 Restless legs syndrome: Secondary | ICD-10-CM | POA: Insufficient documentation

## 2020-04-11 DIAGNOSIS — C20 Malignant neoplasm of rectum: Secondary | ICD-10-CM

## 2020-04-11 DIAGNOSIS — D701 Agranulocytosis secondary to cancer chemotherapy: Secondary | ICD-10-CM | POA: Insufficient documentation

## 2020-04-11 DIAGNOSIS — K769 Liver disease, unspecified: Secondary | ICD-10-CM | POA: Insufficient documentation

## 2020-04-11 DIAGNOSIS — N132 Hydronephrosis with renal and ureteral calculous obstruction: Secondary | ICD-10-CM | POA: Insufficient documentation

## 2020-04-11 DIAGNOSIS — Z720 Tobacco use: Secondary | ICD-10-CM | POA: Insufficient documentation

## 2020-04-11 DIAGNOSIS — T451X5A Adverse effect of antineoplastic and immunosuppressive drugs, initial encounter: Secondary | ICD-10-CM | POA: Diagnosis not present

## 2020-04-11 DIAGNOSIS — E041 Nontoxic single thyroid nodule: Secondary | ICD-10-CM | POA: Diagnosis not present

## 2020-04-11 DIAGNOSIS — K5900 Constipation, unspecified: Secondary | ICD-10-CM | POA: Insufficient documentation

## 2020-04-11 DIAGNOSIS — M25551 Pain in right hip: Secondary | ICD-10-CM | POA: Insufficient documentation

## 2020-04-11 DIAGNOSIS — Z85048 Personal history of other malignant neoplasm of rectum, rectosigmoid junction, and anus: Secondary | ICD-10-CM | POA: Diagnosis not present

## 2020-04-11 DIAGNOSIS — G43909 Migraine, unspecified, not intractable, without status migrainosus: Secondary | ICD-10-CM | POA: Diagnosis not present

## 2020-04-11 DIAGNOSIS — M8448XA Pathological fracture, other site, initial encounter for fracture: Secondary | ICD-10-CM | POA: Insufficient documentation

## 2020-04-11 LAB — BASIC METABOLIC PANEL - CANCER CENTER ONLY
Anion gap: 12 (ref 5–15)
BUN: 14 mg/dL (ref 6–20)
CO2: 26 mmol/L (ref 22–32)
Calcium: 9.7 mg/dL (ref 8.9–10.3)
Chloride: 104 mmol/L (ref 98–111)
Creatinine: 1.21 mg/dL — ABNORMAL HIGH (ref 0.44–1.00)
GFR, Est AFR Am: 58 mL/min — ABNORMAL LOW (ref >60)
GFR, Estimated: 50 mL/min — ABNORMAL LOW (ref >60)
Glucose, Bld: 103 mg/dL — ABNORMAL HIGH (ref 70–99)
Potassium: 4.1 mmol/L (ref 3.5–5.1)
Sodium: 142 mmol/L (ref 135–145)

## 2020-04-11 LAB — CEA (IN HOUSE-CHCC): CEA (CHCC-In House): 3.28 ng/mL (ref 0.00–5.00)

## 2020-04-11 MED ORDER — IOPAMIDOL (ISOVUE-300) INJECTION 61%
100.0000 mL | Freq: Once | INTRAVENOUS | Status: AC | PRN
  Administered 2020-04-11: 100 mL via INTRAVENOUS

## 2020-04-12 ENCOUNTER — Inpatient Hospital Stay (HOSPITAL_BASED_OUTPATIENT_CLINIC_OR_DEPARTMENT_OTHER): Payer: 59 | Admitting: Oncology

## 2020-04-12 ENCOUNTER — Telehealth: Payer: Self-pay | Admitting: Oncology

## 2020-04-12 ENCOUNTER — Other Ambulatory Visit: Payer: Self-pay

## 2020-04-12 VITALS — BP 123/86 | HR 90 | Temp 97.9°F | Resp 17 | Ht 69.0 in | Wt 216.2 lb

## 2020-04-12 DIAGNOSIS — Z85048 Personal history of other malignant neoplasm of rectum, rectosigmoid junction, and anus: Secondary | ICD-10-CM | POA: Diagnosis not present

## 2020-04-12 DIAGNOSIS — C20 Malignant neoplasm of rectum: Secondary | ICD-10-CM

## 2020-04-12 NOTE — Telephone Encounter (Signed)
Scheduled per los. Gave avs and calendar  

## 2020-04-12 NOTE — Progress Notes (Signed)
[facility] OFFICE PROGRESS NOTE   Diagnosis: Rectal cancer  INTERVAL HISTORY:   Norma Colon returns for a scheduled visit.  She complains of constipation despite multiple stool softeners and laxatives.  She has sharp discomfort in the right lateral pelvic/hip area.  This is been present since the right ureter surgery.  Good appetite.  Objective:  Vital signs in last 24 hours:  Blood pressure 123/86, pulse 90, temperature 97.9 F (36.6 C), temperature source Temporal, resp. rate 17, height '5\' 9"'$  (1.753 m), weight 216 lb 3.2 oz (98.1 kg), SpO2 98 %.    HEENT: Neck without mass Lymphatics: No cervical, supraclavicular, axillary, or inguinal nodes Resp: Lungs clear bilaterally Cardio: Regular rate and rhythm GI: No mass, nontender, no hepatosplenomegaly Vascular: No leg edema Musculoskeletal: The area of discomfort is at the right flank and between the right lateral iliac crest and right femoral head.  No mass or rash.    Lab Results:  Lab Results  Component Value Date   WBC 6.1 07/27/2019   HGB 11.6 (L) 08/04/2019   HCT 36.8 08/04/2019   MCV 96.3 07/27/2019   PLT 231 07/27/2019   NEUTROABS 3.1 03/30/2019    CMP  Lab Results  Component Value Date   NA 142 04/11/2020   K 4.1 04/11/2020   CL 104 04/11/2020   CO2 26 04/11/2020   GLUCOSE 103 (H) 04/11/2020   BUN 14 04/11/2020   CREATININE 1.21 (H) 04/11/2020   CALCIUM 9.7 04/11/2020   PROT 7.6 05/29/2019   ALBUMIN 3.9 05/29/2019   AST 29 05/29/2019   ALT 27 05/29/2019   ALKPHOS 139 (H) 05/29/2019   BILITOT 0.6 05/29/2019   GFRNONAA 50 (L) 04/11/2020   GFRAA 58 (L) 04/11/2020    Lab Results  Component Value Date   CEA1 3.28 04/11/2020    Lab Results  Component Value Date   INR 1.2 06/22/2019    Imaging:  CT CHEST W CONTRAST  Addendum Date: 04/11/2020   ADDENDUM REPORT: 04/11/2020 12:58 ADDENDUM: These results will be called to the ordering clinician or representative by the  Radiologist Assistant, and communication documented in the PACS or [employer]. Electronically Signed   By: Zetta Bills M.D.   On: 04/11/2020 12:58   Result Date: 04/11/2020 CLINICAL DATA:  Primary Cancer Type: Rectal Imaging Indication: Routine surveillance Interval therapy since last imaging? No Initial Cancer Diagnosis Date: 05/17/2018; established by: Biopsy-proven Detailed Pathology: Invasive moderately differentiated adenocarcinoma. Primary Tumor location: Partially obstructing mass beginning at 10 cm from the anal verge. Surgeries: Low anterior resection of rectal tumor 09/16/2018. Right ureteral stricture repair 08/03/2019. Chemotherapy: Yes; Ongoing? No; Most recent administration: 12/19/2018 Immunotherapy? No Radiation therapy? Yes; Date Range: 06/13/2018 - 07/20/2018; Target: Rectum EXAM: CT CHEST, ABDOMEN, AND PELVIS WITH CONTRAST TECHNIQUE: Multidetector CT imaging of the chest, abdomen and pelvis was performed following the standard protocol during bolus administration of intravenous contrast. CONTRAST:  132m ISOVUE-300 IOPAMIDOL (ISOVUE-300) INJECTION 61% COMPARISON:  Most recent CT chest, abdomen and pelvis 05/29/2019. 06/01/2018 MRI pelvis. FINDINGS: CT CHEST FINDINGS Cardiovascular: Heart size is normal. Aortic caliber is normal. Central pulmonary vasculature unremarkable on venous phase assessment. Mediastinum/Nodes: LEFT thyroid enlargement with nodule measuring 1.8 cm. Appearance grossly similar to previous imaging. Nodular changes also in the RIGHT thyroid with enlargement. No axillary adenopathy. No internal mammary adenopathy. No mediastinal or hilar adenopathy. Lungs/Pleura: No consolidation. No pleural effusion. Airways are patent. Musculoskeletal: No chest wall lesion. See below for full musculoskeletal detail. CT ABDOMEN PELVIS  FINDINGS Hepatobiliary: Hepatic steatosis. No focal, hepatic steatosis. Subtle nodular area in the inferior RIGHT hepatic lobe (image 70 of series 2) 1.6  x 1.5 cm. Cholelithiasis with collapsed gallbladder. No gross biliary duct distension. Pancreas: No peripancreatic inflammation or ductal dilation. Normal contour the pancreas. Spleen: Spleen is normal. Adrenals/Urinary Tract: Adrenal glands are normal. No signs of hydronephrosis. Interval partial RIGHT ureteral resection and psoas hitch/flap procedure associated with the RIGHT urinary bladder. Resolution of hydronephrosis seen previously. Cortical scarring of the RIGHT kidney more so than the LEFT in the setting of prior, now resolved RIGHT hydro nephrosis. Stomach/Bowel: Stomach under distended.  Small bowel normal caliber. Appendix is normal. Stool fills much of the colon. Post resection of rectal neoplasm with anastomotic site in the pelvis. No abnormal soft tissue or suspicious finding about the anastomotic site on CT. Vascular/Lymphatic: No adenopathy in the retroperitoneum. Vascular structures in the abdomen are patent. No pelvic adenopathy. Reproductive: Uterus and adnexa are unremarkable by CT. Other: No ascites. No peritoneal nodularity. Small fat containing umbilical hernia unchanged. Musculoskeletal: Sclerosis of the bilateral sacral ala with signs of sacral insufficiency fractures bilaterally with discrete cortical disruption of the anterior LEFT sacral cortex. Spinal degenerative changes. Grade 2/3 anterolisthesis of L5 on S1 is similar to the prior study. IMPRESSION: 1. Post resection of rectal neoplasm with anastomotic site in the pelvis. No abnormal soft tissue or suspicious finding about the anastomotic site on CT. 2. Subtle nodular area in the inferior RIGHT hepatic lobe (image 70 of series 2) measuring 1.6 x 1.5 cm. This is suspicious for new hepatic finding, as such suspicious for metastatic focus. Hepatic MRI is suggested for further evaluation. 3. Interval partial RIGHT ureteral resection and psoas hitch/flap procedure associated with the RIGHT urinary bladder. Resolution of hydronephrosis  seen previously. 4. 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 Electronically Signed: By: Zetta Bills M.D. On: 04/11/2020 12:28   CT ABDOMEN PELVIS W CONTRAST  Addendum Date: 04/11/2020   ADDENDUM REPORT: 04/11/2020 12:58 ADDENDUM: These results will be called to the ordering clinician or representative by the Radiologist Assistant, and communication documented in the PACS or [employer]. Electronically Signed   By: Zetta Bills M.D.   On: 04/11/2020 12:58   Result Date: 04/11/2020 CLINICAL DATA:  Primary Cancer Type: Rectal Imaging Indication: Routine surveillance Interval therapy since last imaging? No Initial Cancer Diagnosis Date: 05/17/2018; established by: Biopsy-proven Detailed Pathology: Invasive moderately differentiated adenocarcinoma. Primary Tumor location: Partially obstructing mass beginning at 10 cm from the anal verge. Surgeries: Low anterior resection of rectal tumor 09/16/2018. Right ureteral stricture repair 08/03/2019. Chemotherapy: Yes; Ongoing? No; Most recent administration: 12/19/2018 Immunotherapy? No Radiation therapy? Yes; Date Range: 06/13/2018 - 07/20/2018; Target: Rectum EXAM: CT CHEST, ABDOMEN, AND PELVIS WITH CONTRAST TECHNIQUE: Multidetector CT imaging of the chest, abdomen and pelvis was performed following the standard protocol during bolus administration of intravenous contrast. CONTRAST:  149m ISOVUE-300 IOPAMIDOL (ISOVUE-300) INJECTION 61% COMPARISON:  Most recent CT chest, abdomen and pelvis 05/29/2019. 06/01/2018 MRI pelvis. FINDINGS: CT CHEST FINDINGS Cardiovascular: Heart size is normal. Aortic caliber is normal. Central pulmonary vasculature unremarkable on venous phase assessment. Mediastinum/Nodes: LEFT thyroid enlargement with nodule measuring 1.8 cm. Appearance grossly similar to previous imaging. Nodular changes also in the RIGHT thyroid with enlargement. No axillary adenopathy. No internal mammary  adenopathy. No mediastinal or hilar adenopathy. Lungs/Pleura: No consolidation. No pleural effusion. Airways are patent. Musculoskeletal: No chest wall lesion. See below for full musculoskeletal  detail. CT ABDOMEN PELVIS FINDINGS Hepatobiliary: Hepatic steatosis. No focal, hepatic steatosis. Subtle nodular area in the inferior RIGHT hepatic lobe (image 70 of series 2) 1.6 x 1.5 cm. Cholelithiasis with collapsed gallbladder. No gross biliary duct distension. Pancreas: No peripancreatic inflammation or ductal dilation. Normal contour the pancreas. Spleen: Spleen is normal. Adrenals/Urinary Tract: Adrenal glands are normal. No signs of hydronephrosis. Interval partial RIGHT ureteral resection and psoas hitch/flap procedure associated with the RIGHT urinary bladder. Resolution of hydronephrosis seen previously. Cortical scarring of the RIGHT kidney more so than the LEFT in the setting of prior, now resolved RIGHT hydro nephrosis. Stomach/Bowel: Stomach under distended.  Small bowel normal caliber. Appendix is normal. Stool fills much of the colon. Post resection of rectal neoplasm with anastomotic site in the pelvis. No abnormal soft tissue or suspicious finding about the anastomotic site on CT. Vascular/Lymphatic: No adenopathy in the retroperitoneum. Vascular structures in the abdomen are patent. No pelvic adenopathy. Reproductive: Uterus and adnexa are unremarkable by CT. Other: No ascites. No peritoneal nodularity. Small fat containing umbilical hernia unchanged. Musculoskeletal: Sclerosis of the bilateral sacral ala with signs of sacral insufficiency fractures bilaterally with discrete cortical disruption of the anterior LEFT sacral cortex. Spinal degenerative changes. Grade 2/3 anterolisthesis of L5 on S1 is similar to the prior study. IMPRESSION: 1. Post resection of rectal neoplasm with anastomotic site in the pelvis. No abnormal soft tissue or suspicious finding about the anastomotic site on CT. 2. Subtle  nodular area in the inferior RIGHT hepatic lobe (image 70 of series 2) measuring 1.6 x 1.5 cm. This is suspicious for new hepatic finding, as such suspicious for metastatic focus. Hepatic MRI is suggested for further evaluation. 3. Interval partial RIGHT ureteral resection and psoas hitch/flap procedure associated with the RIGHT urinary bladder. Resolution of hydronephrosis seen previously. 4. 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 Electronically Signed: By: Zetta Bills M.D. On: 04/11/2020 12:28    Medications: I have reviewed the patient's current medications.   Assessment/Plan: 1.  Rectal cancer ? 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 CTs 05/24/2018-mass beginning at proximal he 7.6 cm from the anal verge, "20 "perirectal lymph nodes, no evidence of metastatic disease ? Pelvic MRI 06/01/2018-tumor measured at 10.8 cm from the anal sphincter, T3b, N1-2 left perirectal lymph nodes, each measuring 5 mm ? Radiation and Xeloda initiated 06/13/2018, completed 07/20/2018 ? Low anterior resection 09/16/2018,ypT3,ypN1c, 1 cm tumor, partial response-score 2, 0/13 lymph nodes positive, 2 tumor deposits, MSI-stable, no loss of mismatch repair protein expression ? Cycle 1 CAPOX 10/17/2018 ? Cycle 2 CAPOX 11/07/2018 (oxaliplatin dose reduced due to neutropenia) ? Cycle 3 CAPOX 11/28/2018 ? Cycle 4 CAPOX 12/19/2018 ? CTs 05/29/2019-no evidence of metastatic disease, severe right hydronephrosis, left thyroid nodule ? CTs 04/11/2020-subtle nodular lesion in the inferior right liver suspicious for a new metastasis, status post right ureteral resection and psoas hitch/flap, resolution of hydronephrosis, sacral insufficiency fractures bilaterally 2. Tobacco use 3. Right UPJ stone on CT 05/24/2018  CT 11/21/2018-migration of  previously noted right UPJ stone, new marked right hydroureteronephrosis  Placement of a right JJ stent 11/21/2018  Laser lithotripsy and replacement of right JJ stent 12/12/2018  Followed by Dr. Alyson Ingles, urology 4. Migraine headaches 5. Restless legs 6. Arm pain during the oxaliplatin infusion and for 1 week after cycle 1.  The oxaliplatin will be further diluted and infusion  time extended getting with cycle 2. 7. Neutropenia secondary to chemotherapy.  Oxaliplatin dose reduced beginning with cycle 2. 8. 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 Right hydronephrosis on CT 05/29/2019-referred to Dr. Alyson Ingles, surgical reimplantation of the right ureter on 08/03/2019, right ureter stent removed 09/14/2019 11. Thyroid nodules-new versus enlarged left thyroid nodule CT 05/29/2019, thyroid ultrasound 06/20/2019-multiple nodules, 1 right-sided nodule met criteria for ultrasound follow-up   Disposition: Norma Colon is in clinical remission from rectal cancer.  I reviewed the restaging CT images with her.  There is an apparent new lesion in the inferior right liver.  She will be referred for an MRI of the liver.  I will present her case at the GI tumor conference.  The etiology of the right hip discomfort is unclear.  I doubt this is related to the sacral insufficiency fractures.  The pain may be related to the right ureter surgery.  She will follow up with gastroenterology for management of constipation.  I will refer her for a follow-up thyroid ultrasound.  Norma Colon will return for an office visit in approximately 3 weeks.  Betsy Coder, MD  04/12/2020  12:22 PM

## 2020-04-16 ENCOUNTER — Telehealth: Payer: Self-pay | Admitting: Oncology

## 2020-04-16 ENCOUNTER — Encounter: Payer: Self-pay | Admitting: Nurse Practitioner

## 2020-04-16 NOTE — Telephone Encounter (Signed)
R/s appt per 7/13 sch msg - unable to reach pt .left message with appt date and time

## 2020-04-16 NOTE — Progress Notes (Signed)
Patient calls with multiple questions regarding her recent scan results.  These were reviewed previously by Dr. Benay Spice however I answered her questions.  She also mentions that her constipation is severe, takes multiple laxatives, stool softeners and fiber supplements daily but never feels she has a good bowel movement.  She has an appointment with Dr. Bryan Lemma on 9/1 but asked that I message his office to see if she can be seen sooner.  I have sent him a message.

## 2020-04-17 ENCOUNTER — Other Ambulatory Visit: Payer: Self-pay

## 2020-04-17 ENCOUNTER — Telehealth: Payer: Self-pay | Admitting: Gastroenterology

## 2020-04-17 NOTE — Telephone Encounter (Signed)
Spoke to patient appointment scheduled

## 2020-04-23 ENCOUNTER — Encounter: Payer: Self-pay | Admitting: Physician Assistant

## 2020-04-23 ENCOUNTER — Telehealth: Payer: Self-pay | Admitting: Physician Assistant

## 2020-04-23 ENCOUNTER — Ambulatory Visit (INDEPENDENT_AMBULATORY_CARE_PROVIDER_SITE_OTHER): Payer: 59 | Admitting: Physician Assistant

## 2020-04-23 VITALS — BP 110/74 | HR 96 | Ht 68.0 in | Wt 214.2 lb

## 2020-04-23 DIAGNOSIS — K5901 Slow transit constipation: Secondary | ICD-10-CM | POA: Diagnosis not present

## 2020-04-23 DIAGNOSIS — R140 Abdominal distension (gaseous): Secondary | ICD-10-CM | POA: Diagnosis not present

## 2020-04-23 DIAGNOSIS — C20 Malignant neoplasm of rectum: Secondary | ICD-10-CM | POA: Diagnosis not present

## 2020-04-23 MED ORDER — LINACLOTIDE 290 MCG PO CAPS: 290.0000 ug | ORAL_CAPSULE | Freq: Every day | ORAL | 3 refills | Status: DC

## 2020-04-23 NOTE — Progress Notes (Signed)
Chief Complaint: Follow-up constipation  HPI:    Norma Colon is a 59 year old female with a past medical history as listed below including adenocarcinoma of the rectum and constipation, known to Dr. Bryan Lemma, who presents to clinic today for follow-up of her constipation.      01/16/2020 patient seen in clinic by Dr. Bryan Lemma.  At that time was following up on constipation.  She had a subsequent colonoscopy 12/15/2019 as below as well as a sits marker study on 01/02/2020 with +23 markers in the left colon, is recommended she drink 64 ounces of fluid per day add a fiber supplement and consider trial of Linzess or Trulance.  That time patient continued with constipation only 2 bowel movements a week using Dulcolax and drinking 48 ounces of fluid.  At that time is recommended she take MiraLAX 1 cap per day titrated to twice daily if needed.    Today, the patient continues to complain of constipation telling me that she is lucky to have a bowel movement 1 day out of the week.  Currently she is using a mixture of 2 Dulcolax as well as 2 laxatives including senna and also 2 doses of mag citrate-all of these twice daily.  She also continues Gas-X to help with the bloating intermittently.  Tells me her abdomen often feels hard and she just feels full up.  Nothing seems to be helping.  She is continued her increased water intake.    Social history positive for going to the beach this coming weekend.    Denies fever, chills, blood in her stool or symptoms that awaken her from sleep.     GI History: 59 year old female with rectal cancer diagnosed on colonoscopy in 05/2018.  CT and MRI without metastatic disease.  Treated with radiation and Xeloda followed by low anterior resection on 09/16/2018 with 0/13 lymph nodes positive (MSI stable, no loss of MMR), then CAPOX which completed in 12/2018.  CT in 05/2019 without metastatic disease.   Additionally had right UPJ stone initially seen on CT in 05/2018, with  migration on CT in 11/2018 with right hydroureteronephrosis requiring right JJ stent in 11/2018 followed by lithotripsy and stent replacement in 12/2018.  Right hydronephrosis on repeat CT in 05/2019 with surgical reimplantation of right ureter on 08/03/2019, right ureter stent removal on 09/14/2019.   Endoscopic history: -Colonoscopy (05/17/2018): 7 cm partially obstructing mass in the proximal rectum, located 10 cm from anal verge (rectal adenocarcinoma).  Fair prep -Colonoscopy (05/19/2018): Partial obstructing mass again noted in proximal rectum, tattoo placed.  Benign rectal polyps, otherwise normal.  Skin tags.  Normal TI -Colonoscopy (12/15/2019): Patent healthy-appearing anastomosis, single diverticulum in the ascending colon, internal hemorrhoids, otherwise normal   Past Medical History:  Diagnosis Date  . Adenocarcinoma of rectum [facility]) oncologist-- dr Benay Spice--  per last note in epic ,  clinical remission   dx 08/ 2019---- chemoradiation concurrent completed 07-20-2018;  s/p  low anterior resection 09-16-2018;   chemo 10-17-2018  ot 12-19-2018  . Anxiety   . Arthritis   . Chemotherapy induced neutropenia ([place])   . Depression   . Headache    migraines   . History of HPV infection   . History of kidney stones   . Hydronephrosis, right   . Restless leg syndrome   . Scoliosis   . Sepsis ([place])    11-2018    Past Surgical History:  Procedure Laterality Date  . COLPOSCOPY  04/04/2018  . CYSTOSCOPY WITH RETROGRADE PYELOGRAM, URETEROSCOPY AND  STENT PLACEMENT Right 12/12/2018   Procedure: CYSTOSCOPY WITH RETROGRADE PYELOGRAM, URETEROSCOPY, STONE EXTRACTION AND STENT PLACEMENT;  Surgeon: Cleon Gustin, MD;  Location: [facility];  Service: Urology;  Laterality: Right;  . CYSTOSCOPY WITH RETROGRADE PYELOGRAM, URETEROSCOPY AND STENT PLACEMENT Right 04/17/2019   Procedure: CYSTOSCOPY WITH RETROGRADE PYELOGRAM, BALLOON [place]  URETEROSCOPY AND STENT PLACEMENT;   Surgeon: Cleon Gustin, MD;  Location[facility];  Service: Urology;  Laterality: Right;  . CYSTOSCOPY WITH STENT PLACEMENT Right 11/21/2018   Procedure: CYSTOSCOPY, RIGHT RETROGRADE PYELOGRAM, WITH RIGHT URETERAL STENT PLACEMENT;  Surgeon: Cleon Gustin, MD;  Location: [facility];  Service: Urology;  Laterality: Right;  . IR NEPHROSTOMY PLACEMENT RIGHT  06/22/2019  . IR PATIENT EVAL TECH 0-60 MINS  06/28/2019  . left forearm surgery due to calcium rock     . ROBOT ASSISTED PYELOPLASTY Right 08/03/2019   Procedure: XI ROBOTIC ASSISTED PYELOPLASTY WITH STENT PLACEMENT;  Surgeon: Cleon Gustin, MD;  Location: [facility];  Service: Urology;  Laterality: Right;  3 HRS  . SHOULDER ARTHROSCOPY W/ ROTATOR CUFF REPAIR Right   . XI ROBOTIC ASSISTED LOWER ANTERIOR RESECTION N/A 09/16/2018   Procedure: XI ROBOTIC ASSISTED LOWER ANTERIOR RESECTION ERAS PATHWAY;  Surgeon: Leighton Ruff, MD;  Location: [facility];  Service: General;  Laterality: N/A;    Current Outpatient Medications  Medication Sig Dispense Refill  . alprazolam (XANAX) 2 MG tablet Take 2 mg by mouth daily after breakfast.     . bisacodyl (BISACODYL) 5 MG EC tablet Take 10 mg by mouth in the morning and at bedtime.     [name] buPROPion (WELLBUTRIN XL) 300 MG 24 hr tablet Take 300 mg by mouth daily after breakfast.     . cyclobenzaprine (FLEXERIL) 10 MG tablet Take 10 mg by mouth in the morning, at noon, in the evening, and at bedtime.     [name] HYDROcodone-acetaminophen (NORCO) 10-325 MG tablet Take 1-2 tablets by mouth every 6 (six) hours as needed for moderate pain or severe pain (headache). Post-Operatively. 20 tablet 0  . MAGNESIUM CITRATE PO Take 0.5 Bottles by mouth as needed.    . psyllium (METAMUCIL) 58.6 % powder Take 1 packet by mouth daily.    [name] rOPINIRole (REQUIP) 0.25 MG tablet Take 0.25 mg by mouth at bedtime.    Orlie Dakin Sodium (SENNA-DOCUSATE SODIUM PO) Take 2 tablets by mouth in the morning and at  bedtime. Ducolax Brand     . Simethicone (GAS-X PO) Take 1 tablet by mouth 2 (two) times daily.     No current facility-administered medications for this visit.    Allergies as of 04/23/2020 - Review Complete 04/23/2020  Allergen Reaction Noted  . Codeine Other (See Comments) 05/04/2018    Family History  Problem Relation Age of Onset  . COPD Father        smoker  . Stomach cancer Father        spread to lungs and bones  . Arthritis Mother   . Heart attack Paternal Grandmother   . Heart attack Paternal Grandfather   . Heart attack Maternal Grandmother   . Stroke Maternal Grandmother   . Colon cancer Neg Hx   . Rectal cancer Neg Hx   . Esophageal cancer Neg Hx     Social History   Socioeconomic History  . Marital status: Legally Separated    Spouse name: Not on file  . Number of children: 1  . Years of education: Not on  file  . Highest education level: Not on file  Occupational History  . Occupation: Land  Tobacco Use  . Smoking status: Former Smoker    Packs/day: 1.00    Years: 30.00    Pack years: 30.00    Types: Cigarettes    Quit date: 09/16/2018    Years since quitting: 1.6  . Smokeless tobacco: Never Used  Vaping Use  . Vaping Use: Never used  Substance and Sexual Activity  . Alcohol use: Yes    Comment: social  . Drug use: Never  . Sexual activity: Yes  Other Topics Concern  . Not on file  Social History Narrative  . Not on file   Social Determinants of Health   Financial Resource Strain:   . Difficulty of Paying Living Expenses:   Food Insecurity:   . Worried About Charity fundraiser in the Last Year:   . Arboriculturist in the Last Year:   Transportation Needs:   . Film/video editor (Medical):   [name] Lack of Transportation (Non-Medical):   Physical Activity:   . Days of Exercise per Week:   . Minutes of Exercise per Session:   Stress:   . Feeling of Stress :   Social Connections:   . Frequency of Communication with Friends and  Family:   . Frequency of Social Gatherings with Friends and Family:   . Attends Religious Services:   . Active Member of Clubs or Organizations:   . Attends Archivist Meetings:   [name] Marital Status:   Intimate Partner Violence:   . Fear of Current or Ex-Partner:   . Emotionally Abused:   [name] Physically Abused:   . Sexually Abused:     Review of Systems:    Constitutional: No weight loss, fever or chills Cardiovascular: No chest pain Respiratory: No SOB  Gastrointestinal: See HPI and otherwise negative   Physical Exam:  Vital signs: BP 110/74 (BP Location: Left Arm, Patient Position: Sitting, Cuff Size: Normal)   Pulse 96   Ht 5' 8"  (1.727 m) Comment: height measured without shoes  Wt 214 lb 4 oz (97.2 kg)   LMP  (LMP Unknown)   BMI 32.58 kg/m   Constitutional:   Pleasant Caucasian female appears to be in NAD, Well developed, Well nourished, alert and cooperative Respiratory: Respirations even and unlabored. Lungs clear to auscultation bilaterally.   No wheezes, crackles, or rhonchi.  Cardiovascular: Normal S1, S2. No MRG. Regular rate and rhythm. No peripheral edema, cyanosis or pallor.  Gastrointestinal:  Soft, nondistended, mild generalized ttp,  No rebound or guarding. Decreased BS all four quadrants, No appreciable masses or hepatomegaly. Rectal:  Not performed.  Psychiatric: Demonstrates good judgement and reason without abnormal affect or behaviors.  No recent labs.  Assessment: 1.  Constipation: Delayed transit, no better with multiple laxatives 2.  History of rectal adenocarcinoma: Repeat colonoscopy in 3 years (2024) for ongoing surveillance 3.  Bloating  Plan: 1.  Start the patient on Linzess 290 mcg daily, 30 minutes before breakfast prescribed #30 with 3 refills.  Also provided 8 days of samples for the patient. 2.  Would recommend that the patient continue her stool softeners for now and see how this product works for her.  Hopefully she can stop using  the stimulant laxatives including senna and magnesium citrate.  Told her the goal is to have at least 1 bowel movement every 3 days and to decrease her bloating and discomfort. 3.  Patient  to follow in clinic with me in 1 to 2 months.  Ellouise Newer, PA-C [facility] 04/23/2020, 10:22 AM  Cc: Dian Queen, MD

## 2020-04-23 NOTE — Patient Instructions (Signed)
If you are age 57 or older, your body mass index should be between 23-30. Your Body mass index is 32.58 kg/m. If this is out of the aforementioned range listed, please consider follow up with your Primary Care Provider.  If you are age 47 or younger, your body mass index should be between 19-25. Your Body mass index is 32.58 kg/m. If this is out of the aformentioned range listed, please consider follow up with your Primary Care Provider.   We have sent the following medications to your pharmacy for you to pick up at your convenience: Linzess 290 mcg 30 minutes before breakfast daily    Let us know if no improvement  Follow up in 2 months

## 2020-04-23 NOTE — Telephone Encounter (Signed)
Patient called in reference to the [place] states it is too expensive please advise.

## 2020-04-25 NOTE — Telephone Encounter (Signed)
Informed patient to sign up for the [place] card and see if that will help bring her cost down. She will call us back with an update.

## 2020-04-25 NOTE — Progress Notes (Signed)
Agree with the assessment and plan as outlined by Jennifer Lemmon, PA-C. ? ?Catheryne Deford, DO, FACG ? ?

## 2020-04-25 NOTE — Telephone Encounter (Signed)
Can we try Amitiza 24 BID? THanks-JLL

## 2020-04-25 NOTE — Telephone Encounter (Signed)
Spoke with pharmacy and Linzess cost [phone]. Patient has a high deductible.

## 2020-04-25 NOTE — Telephone Encounter (Signed)
Patient called back and with the copay card and Linzess still cost $329 and she is unable to afford this. Please advise.

## 2020-04-26 MED ORDER — LUBIPROSTONE 24 MCG PO CAPS
24.0000 ug | ORAL_CAPSULE | Freq: Two times a day (BID) | ORAL | 3 refills | Status: DC
Start: 2020-04-26 — End: 2020-05-07

## 2020-04-26 NOTE — Telephone Encounter (Signed)
Script sent into pharmacy. Patient informed.

## 2020-04-26 NOTE — Addendum Note (Signed)
Addended by: Horris Latino on: 04/26/2020 10:14 AM   Modules accepted: Orders

## 2020-04-30 NOTE — Telephone Encounter (Signed)
Pt called stating that Joselyn Arrow is over $300 so she would like to try something else.

## 2020-05-01 NOTE — Telephone Encounter (Signed)
Patient has a high deductible plan is there any generic medication available?

## 2020-05-01 NOTE — Telephone Encounter (Signed)
Can you ask patient to call her insurance and see if there is something they would prefer.  Otherwise we can try Motegrity 2 mg p.o. daily.  Thanks-JLL

## 2020-05-02 MED ORDER — MOTEGRITY 2 MG PO TABS: 2.0000 mg | ORAL_TABLET | Freq: Every day | ORAL | 5 refills | Status: DC

## 2020-05-02 NOTE — Addendum Note (Signed)
Addended by: Horris Latino on: 05/02/2020 01:52 PM   Modules accepted: Orders

## 2020-05-02 NOTE — Telephone Encounter (Signed)
Patient informed. 

## 2020-05-02 NOTE — Telephone Encounter (Signed)
Sent in [place] to pharmacy.

## 2020-05-02 NOTE — Telephone Encounter (Signed)
Pt is requesting a call back from a nurse to discuss which medications could be prescribed.

## 2020-05-03 ENCOUNTER — Ambulatory Visit: Payer: 59 | Admitting: Nurse Practitioner

## 2020-05-05 ENCOUNTER — Encounter: Payer: Self-pay | Admitting: Oncology

## 2020-05-07 ENCOUNTER — Telehealth: Payer: Self-pay | Admitting: Physician Assistant

## 2020-05-07 ENCOUNTER — Telehealth: Payer: Self-pay

## 2020-05-07 NOTE — Telephone Encounter (Signed)
Since patient has Pharmacist, community will try voucher card for motegrity 2 mg, once daily. Activated card and called pharmacy,  [facility] [address]. BIN: [id number] GRP#: [id number] PCN#: [id number] ID# [id number] This would reduce the cost from $200 to $180.  Patient has a large deductible.   Anderson Malta, please advise

## 2020-05-07 NOTE — Telephone Encounter (Signed)
Called patient on home phone and left a voicemail. Left message stating "Scan is to evaluate liver and stool will not impact on views of liver" per Dr Benay Spice. And that she could reach back to the [facility] with any further questions.

## 2020-05-08 ENCOUNTER — Ambulatory Visit
Admission: RE | Admit: 2020-05-08 | Discharge: 2020-05-08 | Disposition: A | Discharge status: Home / Self Care | Payer: 59 | Source: Ambulatory Visit | Attending: Oncology | Admitting: Oncology

## 2020-05-08 ENCOUNTER — Other Ambulatory Visit: Payer: Self-pay

## 2020-05-08 ENCOUNTER — Other Ambulatory Visit: Payer: 59

## 2020-05-08 DIAGNOSIS — C20 Malignant neoplasm of rectum: Secondary | ICD-10-CM

## 2020-05-08 MED ORDER — GADOXETATE DISODIUM 0.25 MMOL/ML IV SOLN
10.0000 mL | Freq: Once | INTRAVENOUS | Status: AC | PRN
  Administered 2020-05-08: 10 mL via INTRAVENOUS

## 2020-05-08 NOTE — Telephone Encounter (Signed)
Called patient and explained that she may want to call her insurance company and see what they could cover the best.  She indicated that she does not have a large deductible and is unclear why the pharmacy would say that.  I encouraged her to call the pharmacy.  She said she would call us back.

## 2020-05-08 NOTE — Telephone Encounter (Signed)
That is fine, please let her know we are finding it difficult to get her anything and this is the best we can do with her insurance. Thanks-JLL

## 2020-05-09 ENCOUNTER — Telehealth: Payer: Self-pay | Admitting: *Deleted

## 2020-05-09 NOTE — Telephone Encounter (Signed)
-----   Message from Ladell Pier, MD sent at 05/09/2020 12:41 PM EDT ----- Please call patient, thyroid nodules are stable, 1 year f/u US recommended

## 2020-05-09 NOTE — Telephone Encounter (Signed)
Notified patient that thyroid US is stable. Recommend recheck in 1 year. She understands and agrees.

## 2020-05-13 ENCOUNTER — Encounter: Payer: Self-pay | Admitting: Oncology

## 2020-05-13 ENCOUNTER — Ambulatory Visit: Payer: 59 | Admitting: Oncology

## 2020-05-14 ENCOUNTER — Other Ambulatory Visit: Payer: Self-pay | Admitting: *Deleted

## 2020-05-14 DIAGNOSIS — C20 Malignant neoplasm of rectum: Secondary | ICD-10-CM

## 2020-05-14 NOTE — Progress Notes (Signed)
Per Dr. Benay Spice: added patient to GI conference for full discussion on 05/22/20. Referral order placed per MD for referral to general surgery--Dr. Barry Dienes to consult for liver biopsy vs. Resection. Records faxed to [facility] referral line

## 2020-05-15 ENCOUNTER — Other Ambulatory Visit: Payer: Self-pay

## 2020-05-15 ENCOUNTER — Telehealth: Payer: Self-pay | Admitting: Oncology

## 2020-05-15 ENCOUNTER — Inpatient Hospital Stay: Payer: 59 | Attending: Oncology | Admitting: Oncology

## 2020-05-15 VITALS — BP 141/84 | HR 104 | Temp 97.4°F | Resp 17 | Ht 68.0 in | Wt 218.6 lb

## 2020-05-15 DIAGNOSIS — R5381 Other malaise: Secondary | ICD-10-CM | POA: Insufficient documentation

## 2020-05-15 DIAGNOSIS — C20 Malignant neoplasm of rectum: Secondary | ICD-10-CM | POA: Diagnosis present

## 2020-05-15 DIAGNOSIS — K5900 Constipation, unspecified: Secondary | ICD-10-CM | POA: Insufficient documentation

## 2020-05-15 DIAGNOSIS — N1330 Unspecified hydronephrosis: Secondary | ICD-10-CM | POA: Insufficient documentation

## 2020-05-15 DIAGNOSIS — Z720 Tobacco use: Secondary | ICD-10-CM | POA: Diagnosis not present

## 2020-05-15 DIAGNOSIS — G43909 Migraine, unspecified, not intractable, without status migrainosus: Secondary | ICD-10-CM | POA: Insufficient documentation

## 2020-05-15 DIAGNOSIS — E042 Nontoxic multinodular goiter: Secondary | ICD-10-CM | POA: Insufficient documentation

## 2020-05-15 DIAGNOSIS — R200 Anesthesia of skin: Secondary | ICD-10-CM | POA: Insufficient documentation

## 2020-05-15 DIAGNOSIS — G2581 Restless legs syndrome: Secondary | ICD-10-CM | POA: Insufficient documentation

## 2020-05-15 DIAGNOSIS — K769 Liver disease, unspecified: Secondary | ICD-10-CM | POA: Insufficient documentation

## 2020-05-15 DIAGNOSIS — R140 Abdominal distension (gaseous): Secondary | ICD-10-CM | POA: Diagnosis not present

## 2020-05-15 NOTE — Progress Notes (Signed)
[facility] OFFICE PROGRESS NOTE   Diagnosis: Rectal cancer  INTERVAL HISTORY:   Norma Colon returns as scheduled.  She continues to have constipation and abdominal bloating.  She reports malaise.  She is followed by gastroenterology for management of constipation.  Objective:  Vital signs in last 24 hours:  Blood pressure (!) 141/84, pulse (!) 104, temperature (!) 97.4 F (36.3 C), temperature source Axillary, resp. rate 17, height _0  (1.727 m), weight 218 lb 9.6 oz (99.2 kg), SpO2 98 %.    Lymphatics: No cervical, supraclavicular, axillary, or inguinal nodes Resp: Lungs clear bilaterally Cardio: Regular rate and rhythm GI: No hepatomegaly, no mass, nontender Vascular: No leg edema   Lab Results:  Lab Results  Component Value Date   WBC 6.1 07/27/2019   HGB 11.6 (L) 08/04/2019   HCT 36.8 08/04/2019   MCV 96.3 07/27/2019   PLT 231 07/27/2019   NEUTROABS 3.1 03/30/2019    CMP  Lab Results  Component Value Date   NA 142 04/11/2020   K 4.1 04/11/2020   CL 104 04/11/2020   CO2 26 04/11/2020   GLUCOSE 103 (H) 04/11/2020   BUN 14 04/11/2020   CREATININE 1.21 (H) 04/11/2020   CALCIUM 9.7 04/11/2020   PROT 7.6 05/29/2019   ALBUMIN 3.9 05/29/2019   AST 29 05/29/2019   ALT 27 05/29/2019   ALKPHOS 139 (H) 05/29/2019   BILITOT 0.6 05/29/2019   GFRNONAA 50 (L) 04/11/2020   GFRAA 58 (L) 04/11/2020    Lab Results  Component Value Date   CEA1 3.28 04/11/2020     Medications: I have reviewed the patient's current medications.   Assessment/Plan: 1.  Rectal cancer ? 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 CTs 05/24/2018-mass beginning at proximal he 7.6 cm from the anal verge, "20 "perirectal lymph nodes, no evidence of metastatic disease ? Pelvic MRI 06/01/2018-tumor measured at 10.8 cm from the anal sphincter, T3b, N1-2  left perirectal lymph nodes, each measuring 5 mm ? Radiation and Xeloda initiated 06/13/2018, completed 07/20/2018 ? Low anterior resection 09/16/2018,ypT3,ypN1c, 1 cm tumor, partial response-score 2, 0/13 lymph nodes positive, 2 tumor deposits, MSI-stable, no loss of mismatch repair protein expression ? Cycle 1 CAPOX 10/17/2018 ? Cycle 2 CAPOX 11/07/2018 (oxaliplatin dose reduced due to neutropenia) ? Cycle 3 CAPOX 11/28/2018 ? Cycle 4 CAPOX 12/19/2018 ? CTs 05/29/2019-no evidence of metastatic disease, severe right hydronephrosis, left thyroid nodule ? CTs 04/11/2020-subtle nodular lesion in the inferior right liver suspicious for a new metastasis, status post right ureteral resection and psoas hitch/flap, resolution of hydronephrosis, sacral insufficiency fractures bilaterally ? MRI liver 05/08/2020-2.1 cm segment 6 lesion suspicious for a metastasis, 4 mm segment 4B lesion consistent with a hemangioma, no abdominal lymphadenopathy, cholelithiasis 2. Tobacco use 3. 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 Alyson Ingles, urology 4. Migraine headaches 5. Restless legs 6. Arm pain during the oxaliplatin infusion and for 1 week after cycle 1.  The oxaliplatin will be further diluted and infusion time extended getting with cycle 2. 7. Neutropenia secondary to chemotherapy.  Oxaliplatin dose reduced beginning with cycle 2. 8. 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  Right hydronephrosis on CT 05/29/2019-referred to Dr. Alyson Ingles, surgical reimplantation of the right ureter on 08/03/2019, right ureter stent removed 09/14/2019 11. Thyroid nodules-new versus enlarged left thyroid nodule CT 05/29/2019, thyroid ultrasound  06/20/2019-multiple nodules, 1 right-sided nodule met criteria for ultrasound follow-up  Ultrasound 05/08/2020-multinodular goiter, stable nodules in the isthmus and right thyroid meeting criteria for 1 year follow-up, additional stable nodules do not meet criteria for follow-up     Disposition: [name] was diagnosed with rectal cancer in 2019.  She was noted to have a potential liver lesion on surveillance CTs last month.  The MRI of the liver is consistent with an isolated liver metastasis.  I reviewed the MRI images with her today.  We discussed treatment options.  We discussed surgical resection, ablation, and SBRT.  She understands it is possible treatment of the liver lesion will be curative, but this is not guaranteed.  The liver lesion likely represents a metastasis from the rectal cancer, but it is possible the lesion is benign or related to another primary tumor.  I will present her case at the GI tumor conference.  She indicated she would like to have the lesion removed.  She would also like to have a cholecystectomy.  We will make a surgical referral.  I will see her following surgery.  Betsy Coder, MD  05/15/2020  11:17 AM

## 2020-05-15 NOTE — Telephone Encounter (Signed)
Scheduled appointment per 8/11 los. Patient is aware of appointment date and time.

## 2020-05-22 ENCOUNTER — Other Ambulatory Visit: Payer: Self-pay

## 2020-05-27 NOTE — Progress Notes (Signed)
Left voice message for patient regarding recommendations from tumor board discussion on 8/18.  I left my direct number for her to call me back.

## 2020-05-28 ENCOUNTER — Other Ambulatory Visit: Payer: Self-pay | Admitting: Oncology

## 2020-05-28 DIAGNOSIS — C20 Malignant neoplasm of rectum: Secondary | ICD-10-CM

## 2020-06-05 ENCOUNTER — Ambulatory Visit: Payer: 59 | Admitting: Gastroenterology

## 2020-06-06 ENCOUNTER — Other Ambulatory Visit: Payer: Self-pay

## 2020-06-06 ENCOUNTER — Ambulatory Visit
Admission: RE | Admit: 2020-06-06 | Discharge: 2020-06-06 | Disposition: A | Discharge status: Home / Self Care | Payer: 59 | Source: Ambulatory Visit | Attending: Oncology | Admitting: Oncology

## 2020-06-06 DIAGNOSIS — C20 Malignant neoplasm of rectum: Secondary | ICD-10-CM

## 2020-06-06 HISTORY — PX: IR RADIOLOGIST EVAL & MGMT: IMG5224

## 2020-06-06 NOTE — Consult Note (Signed)
Chief Complaint: Patient was consulted remotely today (TeleHealth) for liver lesions at the request of Sherrill,Gary B.    Referring Physician(s): Sherrill,Gary B  History of Present Illness: Norma Colon is a 59 y.o. female presents at the kind request of Dr. 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  Norma Colon was initially diagnosed with a partially obstructing rectal mass in August 2019.  She underwent neoadjuvant combined radiation and chemotherapy (Xeloda) from September through October 2019 followed by a low anterior resection in December 2019.    Subsequently, she underwent 4 cycles of additional adjuvant chemotherapy with CAPOX completed in March 2020.  Subsequent surveillance imaging initially detected no evidence of metastatic disease.  Unfortunately, CT imaging from July 2021 demonstrated a subtle lesion in hepatic segment 6 concerning for metastatic disease.  A follow-up MRI dated 05/08/2020 confirms a 2.1 cm solid lesion in hepatic segment 6 as well as a concerning for millimeter lesion in the posterior aspect of segment 3.  Both lesions are concerning for metastatic disease.  Her case was discussed at the multidisciplinary tumor board.  Today, the patient is quite frustrated.  She was under the impression that she was cancer free and had no chance of developing metastatic disease.  Her father died from advanced metastatic cancer and this was quite traumatic for her.  She has concerns that she will also develop widespread metastatic disease.  She wants definitive therapy and would like her metastatic lesions either cut out or treated with liver directed therapy.  She denies abdominal pain, nausea, vomiting, weight loss or other systemic symptoms.  Past Medical History:  Diagnosis Date  . Adenocarcinoma of rectum [facility]) oncologist-- dr Benay Spice--  per last note in epic ,  clinical remission   dx 08/ 2019----  chemoradiation concurrent completed 07-20-2018;  s/p  low anterior resection 09-16-2018;   chemo 10-17-2018  ot 12-19-2018  . Anxiety   . Arthritis   . Chemotherapy induced neutropenia ([place])   . Depression   . Headache    migraines   . History of HPV infection   . History of kidney stones   . Hydronephrosis, right   . Restless leg syndrome   . Scoliosis   . Sepsis ([place])    11-2018    Past Surgical History:  Procedure Laterality Date  . COLPOSCOPY  04/04/2018  . CYSTOSCOPY WITH RETROGRADE PYELOGRAM, URETEROSCOPY AND STENT PLACEMENT Right 12/12/2018   Procedure: CYSTOSCOPY WITH RETROGRADE PYELOGRAM, URETEROSCOPY, STONE EXTRACTION AND STENT PLACEMENT;  Surgeon: Cleon Gustin, MD;  Location: [facility];  Service: Urology;  Laterality: Right;  . CYSTOSCOPY WITH RETROGRADE PYELOGRAM, URETEROSCOPY AND STENT PLACEMENT Right 04/17/2019   Procedure: CYSTOSCOPY WITH RETROGRADE PYELOGRAM, BALLOON [place]  URETEROSCOPY AND STENT PLACEMENT;  Surgeon: Cleon Gustin, MD;  Location: [facility];  Service: Urology;  Laterality: Right;  . CYSTOSCOPY WITH STENT PLACEMENT Right 11/21/2018   Procedure: CYSTOSCOPY, RIGHT RETROGRADE PYELOGRAM, WITH RIGHT URETERAL STENT PLACEMENT;  Surgeon: Cleon Gustin, MD;  Location: [facility];  Service: Urology;  Laterality: Right;  . IR NEPHROSTOMY PLACEMENT RIGHT  06/22/2019  . IR PATIENT EVAL TECH 0-60 MINS  06/28/2019  . left forearm surgery due to calcium rock     . ROBOT ASSISTED PYELOPLASTY Right 08/03/2019   Procedure: XI ROBOTIC ASSISTED PYELOPLASTY WITH STENT PLACEMENT;  Surgeon: Cleon Gustin, MD;  Location: [facility];  Service: Urology;  Laterality: Right;  3 HRS  .  SHOULDER ARTHROSCOPY W/ ROTATOR CUFF REPAIR Right   . XI ROBOTIC ASSISTED LOWER ANTERIOR RESECTION N/A 09/16/2018   Procedure: XI ROBOTIC ASSISTED LOWER ANTERIOR RESECTION ERAS PATHWAY;  Surgeon: Leighton Ruff, MD;  Location: [facility];   Service: General;  Laterality: N/A;    Allergies: Codeine  Medications: Prior to Admission medications   Medication Sig Start Date End Date Taking? Authorizing Provider  alprazolam Duanne Moron) 2 MG tablet Take 2 mg by mouth daily after breakfast.     [provider]  bisacodyl (BISACODYL) 5 MG EC tablet Take 10 mg by mouth in the morning and at bedtime.     [provider]  buPROPion (WELLBUTRIN XL) 300 MG 24 hr tablet Take 300 mg by mouth daily after breakfast.     [provider]  cyclobenzaprine (FLEXERIL) 10 MG tablet Take 10 mg by mouth in the morning, at noon, in the evening, and at bedtime.     [provider]  HYDROcodone-acetaminophen (NORCO) 10-325 MG tablet Take 1-2 tablets by mouth every 6 (six) hours as needed for moderate pain or severe pain (headache). Post-Operatively. 08/05/19   Alexis Frock, MD  MAGNESIUM CITRATE PO Take 0.5 Bottles by mouth as needed.    [provider]  psyllium (METAMUCIL) 58.6 % powder Take 1 packet by mouth daily.    [provider]  rOPINIRole (REQUIP) 0.25 MG tablet Take 0.25 mg by mouth at bedtime.    [provider]  Sennosides-Docusate Sodium (SENNA-DOCUSATE SODIUM PO) Take 2 tablets by mouth in the morning and at bedtime. Ducolax Brand     [provider]  Simethicone (GAS-X PO) Take 1 tablet by mouth 2 (two) times daily.    [provider]     Family History  Problem Relation Age of Onset  . COPD Father        smoker  . Stomach cancer Father        spread to lungs and bones  . Arthritis Mother   . Heart attack Paternal Grandmother   . Heart attack Paternal Grandfather   . Heart attack Maternal Grandmother   . Stroke Maternal Grandmother   . Colon cancer Neg Hx   . Rectal cancer Neg Hx   . Esophageal cancer Neg Hx     Social History   Socioeconomic History  . Marital status: Legally Separated    Spouse name: Not on file  . Number of children: 1  .  Years of education: Not on file  . Highest education level: Not on file  Occupational History  . Occupation: Land  Tobacco Use  . Smoking status: Former Smoker    Packs/day: 1.00    Years: 30.00    Pack years: 30.00    Types: Cigarettes    Quit date: 09/16/2018    Years since quitting: 1.7  . Smokeless tobacco: Never Used  Vaping Use  . Vaping Use: Never used  Substance and Sexual Activity  . Alcohol use: Yes    Comment: social  . Drug use: Never  . Sexual activity: Yes  Other Topics Concern  . Not on file  Social History Narrative  . Not on file   Social Determinants of Health   Financial Resource Strain:   . Difficulty of Paying Living Expenses: Not on file  Food Insecurity:   . Worried About Charity fundraiser in the Last Year: Not on file  . Ran Out of Food in the Last Year: Not on file  Transportation Needs:   . Film/video editor (Medical): Not on file  . Lack of Transportation (Non-Medical): Not on file  Physical Activity:   . Days of Exercise per Week: Not on file  . Minutes of Exercise per Session: Not on file  Stress:   . Feeling of Stress : Not on file  Social Connections:   . Frequency of Communication with Friends and Family: Not on file  . Frequency of Social Gatherings with Friends and Family: Not on file  . Attends Religious Services: Not on file  . Active Member of Clubs or Organizations: Not on file  . Attends Archivist Meetings: Not on file  . Marital Status: Not on file    ECOG Status: 0 - Asymptomatic  Review of Systems  Review of Systems: A 12 point ROS discussed and pertinent positives are indicated in the HPI above.  All other systems are negative.  Physical Exam No direct physical exam was performed (except for noted visual exam findings with Video Visits).    Vital Signs: LMP  (LMP Unknown)   Imaging: MR LIVER W WO CONTRAST  Addendum Date: 05/22/2020   ADDENDUM REPORT: 05/22/2020 08:39 ADDENDUM: Patient  was presented at the multidisciplinary GI tumor board on 05/22/2020. On my review, there is a tiny 0.4 cm hypoenhancing lesion in the posterior segment 3 of the left liver lobe with restricted diffusion (series 16/image 19 and series 18/image 54), also suspicious for metastatic disease, in addition to the 2.1 cm segment 6 right liver mass described in the original report. Close MRI abdomen follow-up imaging is recommended for this lesion. These addended results were discussed by Dr. Polly Cobia at the GI tumor board on 05/22/2020 at 7:50 am with provider Betsy Coder , who verbally acknowledged these results. Electronically Signed   By: Ilona Sorrel M.D.   On: 05/22/2020 08:39   Result Date: 05/22/2020 CLINICAL DATA:  Evaluate new liver lesions seen on CT scan 04/11/2020 EXAM: MRI ABDOMEN WITHOUT AND WITH CONTRAST TECHNIQUE: Multiplanar multisequence MR imaging of the abdomen was performed both before and after the administration of intravenous contrast. CONTRAST:  54mL EOVIST GADOXETATE DISODIUM 0.25 MOL/L IV SOLN COMPARISON:  CT scan 04/11/2020 FINDINGS: Lower chest: The lung bases are clear of an acute process. No pulmonary lesions or pleural effusion. No pericardial effusion. Hepatobiliary: As demonstrated on the recent CT scan there is a lesion in segment 6 of the liver which is new since the prior CT scan from 2020. It demonstrates peripheral enhancement and is worrisome for a new metastatic focus. It measures a maximum of 21 mm on image 60/80. This may be amenable to biopsy and RF ablation. IR consult suggested. There is a 4 mm lesion in segment 4B of the liver which demonstrates slight increased T2 signal intensity and immediate enhancement. It has the same attenuation as the blood pool on delayed images and is most consistent with a small flash filling hemangioma. I do not see any other definite hepatic lesions to suggest metastatic disease elsewhere in the liver. Numerous gallstones are noted in the  gallbladder. No findings for acute cholecystitis. Normal caliber and course of the common bile duct. No common bile duct stones. Pancreas: No mass, inflammation or ductal dilatation. Prominent fatty interstices. Mild pancreatic atrophy. Spleen:  Normal size.  No focal lesions. Adrenals/Urinary Tract: The adrenal glands and kidneys are unremarkable. Stomach/Bowel: The stomach, duodenum, visualized small bowel and visualized colon are unremarkable. Vascular/Lymphatic: The aorta and branch vessels are normal. The  major venous structures are patent. Circumaortic left renal vein is noted. No mesenteric or retroperitoneal mass or lymphadenopathy. Other:  No ascites or abdominal wall hernia. Musculoskeletal: No significant bony findings. IMPRESSION: 1. 21 mm lesion in segment 6 of the liver demonstrates peripheral enhancement and is worrisome for a new metastatic focus. This may be amenable to biopsy and RF ablation. 2. 4 mm lesion in segment 4B of the liver is most consistent with a small flash filling hemangioma. 3. Cholelithiasis but no findings for acute cholecystitis. 4. No abdominal lymphadenopathy. Electronically Signed: By: Marijo Sanes M.D. On: 05/09/2020 10:06   US THYROID  Result Date: 05/08/2020 CLINICAL DATA:  Thyroid nodule follow-up EXAM: THYROID ULTRASOUND TECHNIQUE: Ultrasound examination of the thyroid gland and adjacent soft tissues was performed. COMPARISON:  11/20/2019 FINDINGS: Parenchymal Echotexture: Moderately heterogenous Isthmus: 0.4 cm Right lobe: 6 x 2.1 x 1.6 cm Left lobe: 5.5 x 2 x 2.1 cm _________________________________________________________ Estimated total number of nodules >/= 1 cm: 5 Number of spongiform nodules >/=  2 cm not described below (TR1): 0 Number of mixed cystic and solid nodules >/= 1.5 cm not described below (TR2): 0 _________________________________________________________ Nodule # 1: Prior biopsy: No Location: Isthmus; Mid Maximum size: 1.1 cm; Other 2 dimensions: 1  x 0.5 cm, previously, 1.2 x 1 x 0.5 cm Composition: solid/almost completely solid (2) Echogenicity: hypoechoic (2) Shape: not taller-than-wide (0) Margins: ill-defined (0) Echogenic foci: none (0) ACR TI-RADS total points: 4. ACR TI-RADS risk category:  TR4 (4-6 points). Significant change in size (>/= 20% in two dimensions and minimal increase of 2 mm): No Change in features: No Change in ACR TI-RADS risk category: No ACR TI-RADS recommendations: *Given size (>/= 1 - 1.4 cm) and appearance, a follow-up ultrasound in 1 year should be considered based on TI-RADS criteria. _________________________________________________________ Nodule # 4: Prior biopsy: No Location: Right; Mid Maximum size: 2 cm; Other 2 dimensions: 1.7 x 1.5 cm, previously, 1.8 x 1.6 x 1.5 cm Composition: solid/almost completely solid (2) Echogenicity: isoechoic (1) Shape: not taller-than-wide (0). The measurements provided by the technologist appear to be slightly generous with respect to the AP diamond shin. Margins: ill-defined (0) Echogenic foci: none (0) ACR TI-RADS total points: 3. ACR TI-RADS risk category:  TR3 (3 points). Significant change in size (>/= 20% in two dimensions and minimal increase of 2 mm): No Change in features: No Change in ACR TI-RADS risk category: No ACR TI-RADS recommendations: *Given size (>/= 1.5 - 2.4 cm) and appearance, a follow-up ultrasound in 1 year should be considered based on TI-RADS criteria. _________________________________________________________ Again noted are multiple right-sided TR 3 isoechoic thyroid nodules. These are essentially stable from prior study. Again noted is a stable dominant left-sided mixed cystic and solid thyroid nodule in the left hemi thyroid gland. This nodule is a TR 2 thyroid nodule and does not meet criteria for follow-up. IMPRESSION: 1. Multinodular goiter. 2. Stable thyroid nodules in the isthmus and right mid thyroid gland (Labeled #4 and #1). Again, these thyroid nodules  meet criteria for a 1 year follow-up. 3. Additional stable bilateral thyroid nodules are noted, none of which meet criteria for follow-up or fine-needle aspiration. The above is in keeping with the ACR TI-RADS recommendations - J Am Coll Radiol 2017;14:587-595. Electronically Signed   By: Constance Holster M.D.   On: 05/08/2020 15:16    Labs:  CBC: Recent Labs    06/22/19 0821 07/27/19 1404 08/03/19 1553 08/04/19 0444  WBC 6.3 6.1  --   --  HGB 12.4 12.3 12.2 11.6*  HCT 36.4 38.5 38.1 36.8  PLT 242 231  --   --     COAGS: Recent Labs    06/22/19 0821  INR 1.2    BMP: Recent Labs    07/27/19 1404 08/04/19 0444 10/27/19 1148 04/11/20 1102  NA 139 136 140 142  K 4.0 4.5 3.8 4.1  CL 105 104 104 104  CO2 26 23 27 26   GLUCOSE 104* 174* 114* 103*  BUN 14 18 16 14   CALCIUM 9.3 8.8* 9.1 9.7  CREATININE 0.93 1.22* 1.05* 1.21*  GFRNONAA >60 50* 59* 50*  GFRAA >60 58* >60 58*    LIVER FUNCTION TESTS: No results for input(s): BILITOT, AST, ALT, ALKPHOS, PROT, ALBUMIN in the last 8760 hours.  TUMOR MARKERS: No results for input(s): AFPTM, CEA, CA199, CHROMGRNA in the last 8760 hours.  Assessment and Plan:  59 year old female with presumed metastatic rectal adenocarcinoma to the liver.  She has 2 lesions, one measuring approximately 2 cm in hepatic segment 6, and a smaller approximately 4 mm lesion in the posterior aspect of hepatic segment 3.  She has been seen by Dr. Barry Dienes of surgery to discuss possible surgical options including resection.  Today, we talked at length about minimally invasive options including percutaneous thermal ablation with microwave.  She understands that in the same session we could perform an ultrasound-guided core biopsy to confirm pathologic diagnosis and also obtain tissue for future molecular studies as well as proceed with percutaneous thermal ablation.  We would attempt to target both lesions.  The larger 2 cm lesion we could target  definitively, the smaller 4 mm lesion is more questionable given its very small size.  If we are unable to visualize that lesion, we would need to continue to follow it with MRI imaging and treat it if it enlarges over time.  She understands that percutaneous thermal ablation is slightly less effective in the long-term than surgical resection but is a more minimally invasive procedure with significantly less downtime/recovery and less risk of complication.  Time was given for questions and these were all answered.  Specifically, we discussed that if these truly are the only 2 sites of metastatic disease that percutaneous ablation can be a curative therapy.  However, it would still be possible for her to develop local recurrence or metastatic disease at some point in the future and she will continue to require surveillance imaging.  She would like to proceed with concurrent biopsy and percutaneous thermal ablation as soon as possible.  1.)  Please schedule for CT-guided biopsy and concurrent percutaneous thermal (microwave) ablation at the next available slot.  Lesions in hepatic segment 6 and 3 will be targeted.  Thank you for this interesting consult.  I greatly enjoyed meeting Norma Colon and look forward to participating in their care.  A copy of this report was sent to the requesting provider on this date.  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    Visit type: Audio only (telephone). Audio (no video) only due to patient preference. Alternative for in-person consultation at [facility], [place] [address] [place], [place], [place]. This visit type was conducted due to national recommendations for restrictions regarding the COVID-19 Pandemic (e.g. social distancing).  This format is felt to be  most appropriate  for this patient at this time.  All issues noted in this document were discussed and addressed.

## 2020-06-07 ENCOUNTER — Encounter: Payer: Self-pay | Admitting: Nurse Practitioner

## 2020-06-11 ENCOUNTER — Other Ambulatory Visit (HOSPITAL_COMMUNITY): Payer: Self-pay | Admitting: Interventional Radiology

## 2020-06-11 ENCOUNTER — Encounter: Payer: Self-pay | Admitting: Oncology

## 2020-06-11 DIAGNOSIS — C20 Malignant neoplasm of rectum: Secondary | ICD-10-CM

## 2020-06-11 DIAGNOSIS — K769 Liver disease, unspecified: Secondary | ICD-10-CM

## 2020-06-13 NOTE — Patient Instructions (Signed)
DUE TO COVID-19 ONLY ONE VISITOR IS ALLOWED TO COME WITH YOU AND STAY IN THE WAITING ROOM ONLY DURING PRE OP AND PROCEDURE DAY OF SURGERY. THE 1 VISITOR  MAY VISIT WITH YOU AFTER SURGERY IN YOUR PRIVATE ROOM DURING VISITING HOURS ONLY!  YOU NEED TO HAVE A COVID 19 TEST ON_9/11______ @_______ , THIS TEST MUST BE DONE BEFORE SURGERY,  COVID TESTING SITE [address] [place] [address], IT IS ON THE RIGHT GOING OUT [address] APPROXIMATELY  2 MINUTES PAST [employer] ON THE RIGHT. ONCE YOUR COVID TEST IS COMPLETED,  PLEASE BEGIN THE QUARANTINE INSTRUCTIONS AS OUTLINED IN YOUR HANDOUT.                Norma Colon   Your procedure is scheduled on: 06/19/20   Report to [facility] Main  Entrance   Report to radiology at 6:30 AM     Call this number if you have problems the morning of surgery [phone]    Remember: Do not eat food or drink liquids :After Midnight.   BRUSH YOUR TEETH MORNING OF SURGERY AND RINSE YOUR MOUTH OUT, NO CHEWING GUM CANDY OR MINTS.     Take these medicines the morning of surgery with A SIP OF WATER: Wellbutrin, Requip, Xanax if needed                                 You may not have any metal on your body including hair pins and              piercings  Do not wear jewelry, make-up, lotions, powders or perfumes, deodorant             Do not wear nail polish on your fingernails.  Do not shave  48 hours prior to surgery.             Do not bring valuables to the hospital. [place].  Contacts, dentures or bridgework may not be worn into surgery.  Leave suitcase in the car. After surgery it may be brought to your room.     P Name and phone number of your driver:  Special Instructions: N/A              Please read over the following fact sheets you were given: _____________________________________________________________________             [facility] - Preparing for  Surgery Before surgery, you can play an important role .  Because skin is not sterile, your skin needs to be as free of germs as possible .  You can reduce the number of germs on your skin by washing with CHG (chlorahexidine gluconate) soap before surgery.   CHG is an antiseptic cleaner which kills germs and bonds with the skin to continue killing germs even after washing. Please DO NOT use if you have an allergy to CHG or antibacterial soaps.   If your skin becomes reddened/irritated stop using the CHG and inform your nurse when you arrive at Short Stay. Do not shave (including legs and underarms) for at least 48 hours prior to the first CHG shower.    Please follow these instructions carefully:  1.  Shower with CHG Soap the night before surgery and the  morning of Surgery.  2.  If you  choose to wash your hair, wash your hair first as usual with your  normal  shampoo.  3.  After you shampoo, rinse your hair and body thoroughly to remove the  shampoo.                                        4.  Use CHG as you would any other liquid soap.  You can apply chg directly  to the skin and wash                       Gently with a scrungie or clean washcloth.  5.  Apply the CHG Soap to your body ONLY FROM THE NECK DOWN.   Do not use on face/ open                           Wound or open sores. Avoid contact with eyes, ears mouth and genitals (private parts).                       Wash face,  Genitals (private parts) with your normal soap.             6.  Wash thoroughly, paying special attention to the area where your surgery  will be performed.  7.  Thoroughly rinse your body with warm water from the neck down.  8.  DO NOT shower/wash with your normal soap after using and rinsing off  the CHG Soap.             9.  Pat yourself dry with a clean towel.            10.  Wear clean pajamas.            11.  Place clean sheets on your bed the night of your first shower and do not  sleep with pets. Day of  Surgery : Do not apply any lotions/deodorants the morning of surgery.  Please wear clean clothes to the hospital/surgery center.  FAILURE TO FOLLOW THESE INSTRUCTIONS MAY RESULT IN THE CANCELLATION OF YOUR SURGERY PATIENT SIGNATURE_________________________________  NURSE SIGNATURE__________________________________  ________________________________________________________________________

## 2020-06-14 ENCOUNTER — Other Ambulatory Visit: Payer: Self-pay | Admitting: Radiology

## 2020-06-17 ENCOUNTER — Encounter (HOSPITAL_COMMUNITY)
Admission: RE | Admit: 2020-06-17 | Discharge: 2020-06-17 | Disposition: A | Discharge status: Home / Self Care | Payer: Medicare Other | Source: Ambulatory Visit | Attending: Interventional Radiology | Admitting: Interventional Radiology

## 2020-06-17 ENCOUNTER — Encounter (HOSPITAL_COMMUNITY)
Admission: RE | Admit: 2020-06-17 | Discharge: 2020-06-17 | Disposition: A | Discharge status: Home / Self Care | Payer: 59 | Source: Ambulatory Visit | Attending: Interventional Radiology | Admitting: Interventional Radiology

## 2020-06-17 ENCOUNTER — Encounter (HOSPITAL_COMMUNITY): Payer: Self-pay

## 2020-06-17 ENCOUNTER — Other Ambulatory Visit: Payer: Self-pay

## 2020-06-17 DIAGNOSIS — Z01818 Encounter for other preprocedural examination: Secondary | ICD-10-CM | POA: Insufficient documentation

## 2020-06-17 DIAGNOSIS — Z79899 Other long term (current) drug therapy: Secondary | ICD-10-CM | POA: Insufficient documentation

## 2020-06-17 DIAGNOSIS — Z87891 Personal history of nicotine dependence: Secondary | ICD-10-CM | POA: Insufficient documentation

## 2020-06-17 DIAGNOSIS — C20 Malignant neoplasm of rectum: Secondary | ICD-10-CM | POA: Insufficient documentation

## 2020-06-17 DIAGNOSIS — Z01810 Encounter for preprocedural cardiovascular examination: Secondary | ICD-10-CM | POA: Diagnosis present

## 2020-06-17 DIAGNOSIS — F419 Anxiety disorder, unspecified: Secondary | ICD-10-CM | POA: Insufficient documentation

## 2020-06-17 DIAGNOSIS — Z01812 Encounter for preprocedural laboratory examination: Secondary | ICD-10-CM | POA: Diagnosis not present

## 2020-06-17 DIAGNOSIS — Z7982 Long term (current) use of aspirin: Secondary | ICD-10-CM | POA: Insufficient documentation

## 2020-06-17 LAB — CBC WITH DIFFERENTIAL/PLATELET
Abs Immature Granulocytes: 0.03 10*3/uL (ref 0.00–0.07)
Basophils Absolute: 0 10*3/uL (ref 0.0–0.1)
Basophils Relative: 1 %
Eosinophils Absolute: 0.2 10*3/uL (ref 0.0–0.5)
Eosinophils Relative: 3 %
HCT: 42.7 % (ref 36.0–46.0)
Hemoglobin: 13.7 g/dL (ref 12.0–15.0)
Immature Granulocytes: 1 %
Lymphocytes Relative: 29 %
Lymphs Abs: 1.6 10*3/uL (ref 0.7–4.0)
MCH: 31.1 pg (ref 26.0–34.0)
MCHC: 32.1 g/dL (ref 30.0–36.0)
MCV: 97 fL (ref 80.0–100.0)
Monocytes Absolute: 0.6 10*3/uL (ref 0.1–1.0)
Monocytes Relative: 10 %
Neutro Abs: 3.3 10*3/uL (ref 1.7–7.7)
Neutrophils Relative %: 56 %
Platelets: 270 10*3/uL (ref 150–400)
RBC: 4.4 MIL/uL (ref 3.87–5.11)
RDW: 13.1 % (ref 11.5–15.5)
WBC: 5.7 10*3/uL (ref 4.0–10.5)
nRBC: 0 % (ref 0.0–0.2)

## 2020-06-17 LAB — COMPREHENSIVE METABOLIC PANEL
ALT: 58 U/L — ABNORMAL HIGH (ref 0–44)
AST: 45 U/L — ABNORMAL HIGH (ref 15–41)
Albumin: 4.1 g/dL (ref 3.5–5.0)
Alkaline Phosphatase: 102 U/L (ref 38–126)
Anion gap: 9 (ref 5–15)
BUN: 22 mg/dL — ABNORMAL HIGH (ref 6–20)
CO2: 27 mmol/L (ref 22–32)
Calcium: 9.5 mg/dL (ref 8.9–10.3)
Chloride: 103 mmol/L (ref 98–111)
Creatinine, Ser: 0.88 mg/dL (ref 0.44–1.00)
GFR calc Af Amer: >60 mL/min (ref >60)
GFR calc non Af Amer: >60 mL/min (ref >60)
Glucose, Bld: 95 mg/dL (ref 70–99)
Potassium: 4.7 mmol/L (ref 3.5–5.1)
Sodium: 139 mmol/L (ref 135–145)
Total Bilirubin: 0.6 mg/dL (ref 0.3–1.2)
Total Protein: 8 g/dL (ref 6.5–8.1)

## 2020-06-17 NOTE — Patient Instructions (Addendum)
DUE TO COVID-19 ONLY ONE VISITOR IS ALLOWED TO COME WITH YOU AND STAY IN THE WAITING ROOM ONLY DURING PRE OP AND PROCEDURE DAY OF SURGERY. THE 1 VISITOR  MAY VISIT WITH YOU AFTER SURGERY IN YOUR PRIVATE ROOM DURING VISITING HOURS ONLY!  YOU NEED TO HAVE A COVID 19 TEST ON: 06/18/20@ 8:30 am, THIS TEST MUST BE DONE BEFORE SURGERY,  COVID TESTING SITE [place] [address], IT IS ON THE RIGHT GOING OUT [address] APPROXIMATELY  2 MINUTES PAST [employer] ON THE RIGHT. ONCE YOUR COVID TEST IS COMPLETED,  PLEASE BEGIN THE QUARANTINE INSTRUCTIONS AS OUTLINED IN YOUR HANDOUT.                Norma Colon    Your procedure is scheduled on:    Report to [facility] Main  Entrance   Report to admitting at: 6:30 AM     Call this number if you have problems the morning of surgery [phone]    Remember: Do not eat food or drink liquids :After Midnight.   BRUSH YOUR TEETH MORNING OF SURGERY AND RINSE YOUR MOUTH OUT, NO CHEWING GUM CANDY OR MINTS.     Take these medicines the morning of surgery with A SIP OF WATER: Wellbutrin,[place].Xanax if needed.                               You may not have any metal on your body including hair pins and              piercings  Do not wear jewelry, make-up, lotions, powders or perfumes, deodorant             Do not wear nail polish on your fingernails.  Do not shave  48 hours prior to surgery.                Do not bring valuables to the hospital. [place].  Contacts, dentures or bridgework may not be worn into surgery.  Leave suitcase in the car. After surgery it may be brought to your room.     Patients discharged the day of surgery will not be allowed to drive home. IF YOU ARE HAVING SURGERY AND GOING HOME THE SAME DAY, YOU MUST HAVE AN ADULT TO DRIVE YOU HOME AND BE WITH YOU FOR 24 HOURS. YOU MAY GO HOME BY TAXI OR UBER OR ORTHERWISE, BUT AN ADULT MUST ACCOMPANY  YOU HOME AND STAY WITH YOU FOR 24 HOURS.  Name and phone number of your driver:  Special Instructions: N/A              Please read over the following fact sheets you were given: _____________________________________________________________________         [facility] - Preparing for Surgery Before surgery, you can play an important role.  Because skin is not sterile, your skin needs to be as free of germs as possible.  You can reduce the number of germs on your skin by washing with CHG (chlorahexidine gluconate) soap before surgery.  CHG is an antiseptic cleaner which kills germs and bonds with the skin to continue killing germs even after washing. Please DO NOT use if you have an allergy to CHG or antibacterial soaps.  If your skin becomes reddened/irritated stop using the CHG  and inform your nurse when you arrive at Short Stay. Do not shave (including legs and underarms) for at least 48 hours prior to the first CHG shower.  You may shave your face/neck. Please follow these instructions carefully:  1.  Shower with CHG Soap the night before surgery and the  morning of Surgery.  2.  If you choose to wash your hair, wash your hair first as usual with your  normal  shampoo.  3.  After you shampoo, rinse your hair and body thoroughly to remove the  shampoo.                           4.  Use CHG as you would any other liquid soap.  You can apply chg directly  to the skin and wash                       Gently with a scrungie or clean washcloth.  5.  Apply the CHG Soap to your body ONLY FROM THE NECK DOWN.   Do not use on face/ open                           Wound or open sores. Avoid contact with eyes, ears mouth and genitals (private parts).                       Wash face,  Genitals (private parts) with your normal soap.             6.  Wash thoroughly, paying special attention to the area where your surgery  will be performed.  7.  Thoroughly rinse your body with warm water from the neck down.  8.   DO NOT shower/wash with your normal soap after using and rinsing off  the CHG Soap.                9.  Pat yourself dry with a clean towel.            10.  Wear clean pajamas.            11.  Place clean sheets on your bed the night of your first shower and do not  sleep with pets. Day of Surgery : Do not apply any lotions/deodorants the morning of surgery.  Please wear clean clothes to the hospital/surgery center.  FAILURE TO FOLLOW THESE INSTRUCTIONS MAY RESULT IN THE CANCELLATION OF YOUR SURGERY PATIENT SIGNATURE_________________________________  NURSE SIGNATURE__________________________________  ________________________________________________________________________

## 2020-06-17 NOTE — Progress Notes (Signed)
COVID Vaccine Completed: Date COVID Vaccine completed: COVID vaccine manufacturer: [employer]   PCP - Dr. Vernice Jefferson. Cardiologist - NO  Chest x-ray - CT chest: 04/11/20. EPIC EKG -  Stress Test -  ECHO -  Cardiac Cath -  Pacemaker/ICD device last checked:  Sleep Study -  CPAP -   Fasting Blood Sugar -  Checks Blood Sugar _____ times a day  Blood Thinner Instructions: Aspirin Instructions: Last Dose:  Anesthesia review:   Patient denies shortness of breath, fever, cough and chest pain at PAT appointment   Patient verbalized understanding of instructions that were given to them at the PAT appointment. Patient was also instructed that they will need to review over the PAT instructions again at home before surgery.

## 2020-06-17 NOTE — Progress Notes (Signed)
COVID Vaccine Completed:NO Date COVID Vaccine completed: COVID vaccine manufacturer: [employer]    [employer]   PCP - Dr.  Cardiologist -   Chest x-ray - CT chest: 04/11/20. EPIC EKG -  Stress Test -  ECHO -  Cardiac Cath -  Pacemaker/ICD device last checked:  Sleep Study -  CPAP -   Fasting Blood Sugar -  Checks Blood Sugar _____ times a day  Blood Thinner Instructions: Aspirin Instructions: Last Dose:  Anesthesia review:   Patient denies shortness of breath, fever, cough and chest pain at PAT appointment   Patient verbalized understanding of instructions that were given to them at the PAT appointment. Patient was also instructed that they will need to review over the PAT instructions again at home before surgery.

## 2020-06-18 ENCOUNTER — Ambulatory Visit: Payer: 59 | Admitting: Nurse Practitioner

## 2020-06-18 ENCOUNTER — Other Ambulatory Visit (HOSPITAL_COMMUNITY)
Admission: RE | Admit: 2020-06-18 | Discharge: 2020-06-18 | Disposition: A | Discharge status: Home / Self Care | Payer: 59 | Source: Ambulatory Visit | Attending: Interventional Radiology | Admitting: Interventional Radiology

## 2020-06-18 ENCOUNTER — Other Ambulatory Visit: Payer: Self-pay | Admitting: Radiology

## 2020-06-18 DIAGNOSIS — Z01812 Encounter for preprocedural laboratory examination: Secondary | ICD-10-CM | POA: Insufficient documentation

## 2020-06-18 DIAGNOSIS — Z20822 Contact with and (suspected) exposure to covid-19: Secondary | ICD-10-CM | POA: Diagnosis not present

## 2020-06-18 LAB — SARS CORONAVIRUS 2 (TAT 6-24 HRS): SARS Coronavirus 2: NEGATIVE

## 2020-06-18 NOTE — Progress Notes (Signed)
Anesthesia Chart Review   Case: [id number] Date/Time: 06/19/20 0815   Procedure: CT WITH ANESTHESIA MICROWAVE ABLATION (N/A )   Anesthesia type: General   Pre-op diagnosis: rectal cancer and liver lesion   [facility]   Surgeons: Jacqulynn Cadet, MD      DISCUSSION:59 y.o. 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 Jacqulynn Cadet.    S/p radiation/chemotherapy 9-07/2018, low anterior resection 09/2018, additional chemotherapy 12/2018.   No previous anesthesia complications noted.    Anticipate pt can proceed with planned procedure barring acute status change.   VS: BP 130/89   Pulse 87   Temp 36.8 C (Oral)   Resp 20   Ht 5\' 8"  (1.727 m)   Wt 99.8 kg   LMP  (LMP Unknown)   BMI 33.45 kg/m   PROVIDERS: Dian Queen, MD is OBGYN   LABS: Labs reviewed: Acceptable for surgery. (all labs ordered are listed, but only abnormal results are displayed)  Labs Reviewed  COMPREHENSIVE METABOLIC PANEL - Abnormal; Notable for the following components:      Result Value   BUN 22 (*)    AST 45 (*)    ALT 58 (*)    All other components within normal limits  CBC WITH DIFFERENTIAL/PLATELET  TYPE AND SCREEN     IMAGES:   EKG: 06/17/2020 Rate 71 bpm  NSR  CV:  Past Medical History:  Diagnosis Date  . Adenocarcinoma of rectum [facility]) oncologist-- dr Benay Spice--  per last note in epic ,  clinical remission   dx 08/ 2019---- chemoradiation concurrent completed 07-20-2018;  s/p  low anterior resection 09-16-2018;   chemo 10-17-2018  ot 12-19-2018  . Anxiety   . Arthritis   . Chemotherapy induced neutropenia ([place])   . Depression   . Headache    migraines   . History of HPV infection   . History of kidney stones   . Hydronephrosis, right   . Restless leg syndrome   . Scoliosis   . Sepsis ([place])    11-2018    Past Surgical History:  Procedure Laterality Date   . COLPOSCOPY  04/04/2018  . CYSTOSCOPY WITH RETROGRADE PYELOGRAM, URETEROSCOPY AND STENT PLACEMENT Right 12/12/2018   Procedure: CYSTOSCOPY WITH RETROGRADE PYELOGRAM, URETEROSCOPY, STONE EXTRACTION AND STENT PLACEMENT;  Surgeon: Cleon Gustin, MD;  Location: [facility];  Service: Urology;  Laterality: Right;  . CYSTOSCOPY WITH RETROGRADE PYELOGRAM, URETEROSCOPY AND STENT PLACEMENT Right 04/17/2019   Procedure: CYSTOSCOPY WITH RETROGRADE PYELOGRAM, BALLOON [place]  URETEROSCOPY AND STENT PLACEMENT;  Surgeon: Cleon Gustin, MD;  Location: [facility];  Service: Urology;  Laterality: Right;  . CYSTOSCOPY WITH STENT PLACEMENT Right 11/21/2018   Procedure: CYSTOSCOPY, RIGHT RETROGRADE PYELOGRAM, WITH RIGHT URETERAL STENT PLACEMENT;  Surgeon: Cleon Gustin, MD;  [facility];  Service: Urology;  Laterality: Right;  . IR NEPHROSTOMY PLACEMENT RIGHT  06/22/2019  . IR PATIENT EVAL TECH 0-60 MINS  06/28/2019  . IR RADIOLOGIST EVAL & MGMT  06/06/2020  . left forearm surgery due to calcium rock     . ROBOT ASSISTED PYELOPLASTY Right 08/03/2019   Procedure: XI ROBOTIC ASSISTED PYELOPLASTY WITH STENT PLACEMENT;  Surgeon: Cleon Gustin, MD;  [facility];  Service: Urology;  Laterality: Right;  3 HRS  . SHOULDER ARTHROSCOPY W/ ROTATOR CUFF REPAIR Right   . XI ROBOTIC ASSISTED LOWER ANTERIOR RESECTION N/A 09/16/2018  Procedure: XI ROBOTIC ASSISTED LOWER ANTERIOR RESECTION ERAS PATHWAY;  Surgeon: Leighton Ruff, MD;  [facility];  Service: General;  Laterality: N/A;    MEDICATIONS: . alprazolam Duanne Moron) 2 MG tablet  . Ascorbic Acid (VITAMIN C PO)  . aspirin EC 81 MG tablet  . BIOTIN PO  . bisacodyl (BISACODYL) 5 MG EC tablet  . buPROPion (WELLBUTRIN XL) 300 MG 24 hr tablet  . Calcium Carbonate-Vitamin D (CALCIUM-D PO)  . CALCIUM PO  . Cholecalciferol (VITAMIN D3 PO)  . cyclobenzaprine (FLEXERIL) 10 MG tablet  .  HYDROcodone-acetaminophen (NORCO) 10-325 MG tablet  . MAGNESIUM CITRATE PO  . MAGNESIUM PO  . Multiple Vitamin (MULTIVITAMIN WITH MINERALS) TABS tablet  . Prenatal Vit-Fe Fumarate-FA (PRENATAL VITAMIN PO)  . Probiotic Product (PROBIOTIC PO)  . psyllium (METAMUCIL) 58.6 % powder  . rOPINIRole (REQUIP) 0.25 MG tablet  . Sennosides-Docusate Sodium (SENNA-DOCUSATE SODIUM PO)  . Simethicone (GAS-X PO)   No current facility-administered medications for this encounter.    Konrad Felix, PA-C [facility] [phone]

## 2020-06-18 NOTE — Anesthesia Preprocedure Evaluation (Addendum)
Anesthesia Evaluation  Patient identified by MRN, date of birth, ID band Patient awake    Reviewed: Allergy & Precautions, NPO status , Patient's Chart, lab work & pertinent test results  History of Anesthesia Complications Negative for: history of anesthetic complications  Airway Mallampati: II  TM Distance: >3 FB Neck ROM: Full    Dental no notable dental hx. (+) Dental Advisory Given   Pulmonary neg pulmonary ROS, former smoker,    Pulmonary exam normal        Cardiovascular negative cardio ROS Normal cardiovascular exam     Neuro/Psych  Headaches, PSYCHIATRIC DISORDERS Anxiety Depression Restless legs syndrome    GI/Hepatic Neg liver ROS, Hx/o rectal Ca s/p RT, chemoRx and APR   Endo/Other  Obesity   Renal/GU Renal diseaseRight ureteral stricture Right hydronephrosis   negative genitourinary   Musculoskeletal  (+) Arthritis , Osteoarthritis,    Abdominal (+) + obese,   Peds  Hematology negative hematology ROS (+)   Anesthesia Other Findings   Reproductive/Obstetrics                            Anesthesia Physical  Anesthesia Plan  ASA: III  Anesthesia Plan: General   Post-op Pain Management:    Induction: Intravenous  PONV Risk Score and Plan: 4 or greater and Midazolam, Ondansetron, Dexamethasone and Treatment may vary due to age or medical condition  Airway Management Planned: Oral ETT  Additional Equipment:   Intra-op Plan:   Post-operative Plan: Extubation in OR  Informed Consent: I have reviewed the patients History and Physical, chart, labs and discussed the procedure including the risks, benefits and alternatives for the proposed anesthesia with the patient or authorized representative who has indicated his/her understanding and acceptance.     Dental advisory given  Plan Discussed with: Anesthesiologist and CRNA  Anesthesia Plan Comments:         Anesthesia Quick Evaluation

## 2020-06-19 ENCOUNTER — Ambulatory Visit (HOSPITAL_COMMUNITY): Payer: 59

## 2020-06-19 ENCOUNTER — Encounter (HOSPITAL_COMMUNITY): Payer: Self-pay | Admitting: Interventional Radiology

## 2020-06-19 ENCOUNTER — Other Ambulatory Visit: Payer: Self-pay

## 2020-06-19 ENCOUNTER — Encounter (HOSPITAL_COMMUNITY)
Admission: RE | Disposition: A | Discharge status: Home / Self Care | Payer: Self-pay | Source: Home / Self Care | Attending: Interventional Radiology

## 2020-06-19 ENCOUNTER — Ambulatory Visit (HOSPITAL_COMMUNITY)
Admission: RE | Admit: 2020-06-19 | Discharge: 2020-06-19 | Disposition: A | Discharge status: Home / Self Care | Payer: 59 | Source: Ambulatory Visit | Attending: Interventional Radiology | Admitting: Interventional Radiology

## 2020-06-19 ENCOUNTER — Encounter: Payer: Self-pay | Admitting: *Deleted

## 2020-06-19 ENCOUNTER — Observation Stay (HOSPITAL_COMMUNITY)
Admission: RE | Admit: 2020-06-19 | Discharge: 2020-06-20 | Disposition: A | Discharge status: Home / Self Care | Payer: 59 | Attending: Interventional Radiology | Admitting: Interventional Radiology

## 2020-06-19 ENCOUNTER — Encounter (HOSPITAL_COMMUNITY): Payer: Self-pay

## 2020-06-19 ENCOUNTER — Ambulatory Visit (HOSPITAL_COMMUNITY): Payer: 59 | Admitting: Physician Assistant

## 2020-06-19 ENCOUNTER — Ambulatory Visit (HOSPITAL_COMMUNITY): Payer: 59 | Admitting: Registered Nurse

## 2020-06-19 DIAGNOSIS — Z885 Allergy status to narcotic agent status: Secondary | ICD-10-CM | POA: Insufficient documentation

## 2020-06-19 DIAGNOSIS — Z87891 Personal history of nicotine dependence: Secondary | ICD-10-CM | POA: Insufficient documentation

## 2020-06-19 DIAGNOSIS — C787 Secondary malignant neoplasm of liver and intrahepatic bile duct: Principal | ICD-10-CM | POA: Insufficient documentation

## 2020-06-19 DIAGNOSIS — Z01818 Encounter for other preprocedural examination: Secondary | ICD-10-CM

## 2020-06-19 DIAGNOSIS — C20 Malignant neoplasm of rectum: Secondary | ICD-10-CM | POA: Insufficient documentation

## 2020-06-19 DIAGNOSIS — Z79899 Other long term (current) drug therapy: Secondary | ICD-10-CM | POA: Diagnosis not present

## 2020-06-19 DIAGNOSIS — K769 Liver disease, unspecified: Secondary | ICD-10-CM

## 2020-06-19 DIAGNOSIS — F419 Anxiety disorder, unspecified: Secondary | ICD-10-CM | POA: Diagnosis not present

## 2020-06-19 DIAGNOSIS — Z808 Family history of malignant neoplasm of other organs or systems: Secondary | ICD-10-CM | POA: Insufficient documentation

## 2020-06-19 DIAGNOSIS — M1990 Unspecified osteoarthritis, unspecified site: Secondary | ICD-10-CM | POA: Diagnosis not present

## 2020-06-19 DIAGNOSIS — M419 Scoliosis, unspecified: Secondary | ICD-10-CM | POA: Diagnosis not present

## 2020-06-19 DIAGNOSIS — Z8619 Personal history of other infectious and parasitic diseases: Secondary | ICD-10-CM | POA: Insufficient documentation

## 2020-06-19 DIAGNOSIS — Z7982 Long term (current) use of aspirin: Secondary | ICD-10-CM | POA: Insufficient documentation

## 2020-06-19 DIAGNOSIS — Z801 Family history of malignant neoplasm of trachea, bronchus and lung: Secondary | ICD-10-CM | POA: Insufficient documentation

## 2020-06-19 DIAGNOSIS — F329 Major depressive disorder, single episode, unspecified: Secondary | ICD-10-CM | POA: Diagnosis not present

## 2020-06-19 DIAGNOSIS — Z800 Family history of malignant neoplasm of digestive organs: Secondary | ICD-10-CM | POA: Diagnosis not present

## 2020-06-19 DIAGNOSIS — G2581 Restless legs syndrome: Secondary | ICD-10-CM | POA: Insufficient documentation

## 2020-06-19 HISTORY — PX: RADIOLOGY WITH ANESTHESIA: SHX6223

## 2020-06-19 LAB — CBC
HCT: 38.2 % (ref 36.0–46.0)
Hemoglobin: 12.7 g/dL (ref 12.0–15.0)
MCH: 31.4 pg (ref 26.0–34.0)
MCHC: 33.2 g/dL (ref 30.0–36.0)
MCV: 94.6 fL (ref 80.0–100.0)
Platelets: 221 10*3/uL (ref 150–400)
RBC: 4.04 MIL/uL (ref 3.87–5.11)
RDW: 13.2 % (ref 11.5–15.5)
WBC: 4.6 10*3/uL (ref 4.0–10.5)
nRBC: 0 % (ref 0.0–0.2)

## 2020-06-19 LAB — TYPE AND SCREEN
ABO/RH(D): A POS
Antibody Screen: NEGATIVE

## 2020-06-19 LAB — PROTIME-INR
INR: 1.1 (ref 0.8–1.2)
Prothrombin Time: 13.6 seconds (ref 11.4–15.2)

## 2020-06-19 SURGERY — CT WITH ANESTHESIA
Anesthesia: General

## 2020-06-19 MED ORDER — HYDROMORPHONE HCL 1 MG/ML IJ SOLN
INTRAMUSCULAR | Status: AC
  Filled 2020-06-19: qty 1

## 2020-06-19 MED ORDER — SUGAMMADEX SODIUM 200 MG/2ML IV SOLN
INTRAVENOUS | Status: DC | PRN
  Administered 2020-06-19: 400 mg via INTRAVENOUS

## 2020-06-19 MED ORDER — FENTANYL CITRATE (PF) 100 MCG/2ML IJ SOLN
INTRAMUSCULAR | Status: AC
  Filled 2020-06-19: qty 4

## 2020-06-19 MED ORDER — ONDANSETRON HCL 4 MG/2ML IJ SOLN
INTRAMUSCULAR | Status: DC | PRN
  Administered 2020-06-19: 4 mg via INTRAVENOUS

## 2020-06-19 MED ORDER — HYDROMORPHONE HCL 1 MG/ML IJ SOLN
0.5000 mg | INTRAMUSCULAR | Status: DC | PRN
  Administered 2020-06-19: 0.5 mg via INTRAVENOUS

## 2020-06-19 MED ORDER — LACTATED RINGERS IV SOLN: INTRAVENOUS | Status: DC

## 2020-06-19 MED ORDER — BUPROPION HCL ER (XL) 300 MG PO TB24
300.0000 mg | ORAL_TABLET | Freq: Every morning | ORAL | Status: DC
  Filled 2020-06-19: qty 1

## 2020-06-19 MED ORDER — FENTANYL CITRATE (PF) 100 MCG/2ML IJ SOLN
25.0000 ug | INTRAMUSCULAR | Status: DC | PRN
  Administered 2020-06-19 (×3): 50 ug via INTRAVENOUS

## 2020-06-19 MED ORDER — IOHEXOL 300 MG/ML  SOLN
100.0000 mL | Freq: Once | INTRAMUSCULAR | Status: AC | PRN
  Administered 2020-06-19: 100 mL via INTRAVENOUS

## 2020-06-19 MED ORDER — CHLORHEXIDINE GLUCONATE 0.12 % MT SOLN
15.0000 mL | Freq: Once | OROMUCOSAL | Status: AC
  Administered 2020-06-19: 15 mL via OROMUCOSAL

## 2020-06-19 MED ORDER — DIPHENHYDRAMINE HCL 12.5 MG/5ML PO ELIX: 12.5000 mg | ORAL_SOLUTION | Freq: Four times a day (QID) | ORAL | Status: DC | PRN

## 2020-06-19 MED ORDER — SODIUM CHLORIDE 0.9% FLUSH: 9.0000 mL | INTRAVENOUS | Status: DC | PRN

## 2020-06-19 MED ORDER — ONDANSETRON HCL 4 MG/2ML IJ SOLN: 4.0000 mg | Freq: Four times a day (QID) | INTRAMUSCULAR | Status: DC | PRN

## 2020-06-19 MED ORDER — DOCUSATE SODIUM 100 MG PO CAPS
100.0000 mg | ORAL_CAPSULE | Freq: Two times a day (BID) | ORAL | Status: DC
  Administered 2020-06-19 – 2020-06-20 (×2): 100 mg via ORAL
  Filled 2020-06-19 (×2): qty 1

## 2020-06-19 MED ORDER — SIMETHICONE 80 MG PO CHEW: 160.0000 mg | CHEWABLE_TABLET | Freq: Four times a day (QID) | ORAL | Status: DC | PRN

## 2020-06-19 MED ORDER — OXYCODONE HCL 5 MG PO TABS
ORAL_TABLET | ORAL | Status: AC
  Filled 2020-06-19: qty 2

## 2020-06-19 MED ORDER — DIPHENHYDRAMINE HCL 50 MG/ML IJ SOLN: 12.5000 mg | Freq: Four times a day (QID) | INTRAMUSCULAR | Status: DC | PRN

## 2020-06-19 MED ORDER — EPHEDRINE SULFATE-NACL 50-0.9 MG/10ML-% IV SOSY
PREFILLED_SYRINGE | INTRAVENOUS | Status: DC | PRN
  Administered 2020-06-19 (×2): 15 mg via INTRAVENOUS

## 2020-06-19 MED ORDER — OXYCODONE HCL 5 MG PO TABS
10.0000 mg | ORAL_TABLET | ORAL | Status: DC | PRN
  Administered 2020-06-19: 10 mg via ORAL

## 2020-06-19 MED ORDER — GLYCOPYRROLATE 0.2 MG/ML IJ SOLN
INTRAMUSCULAR | Status: DC | PRN
  Administered 2020-06-19: .2 mg via INTRAVENOUS

## 2020-06-19 MED ORDER — ROCURONIUM BROMIDE 10 MG/ML (PF) SYRINGE
PREFILLED_SYRINGE | INTRAVENOUS | Status: DC | PRN
  Administered 2020-06-19: 100 mg via INTRAVENOUS

## 2020-06-19 MED ORDER — SENNOSIDES-DOCUSATE SODIUM 8.6-50 MG PO TABS: 1.0000 | ORAL_TABLET | Freq: Every evening | ORAL | Status: DC | PRN

## 2020-06-19 MED ORDER — HYDROMORPHONE HCL 1 MG/ML IJ SOLN
INTRAMUSCULAR | Status: AC
Start: 2020-06-19 — End: 2020-06-20
  Filled 2020-06-19: qty 1

## 2020-06-19 MED ORDER — MIDAZOLAM HCL 2 MG/2ML IJ SOLN
INTRAMUSCULAR | Status: AC
  Filled 2020-06-19: qty 2

## 2020-06-19 MED ORDER — PROPOFOL 10 MG/ML IV BOLUS
INTRAVENOUS | Status: DC | PRN
  Administered 2020-06-19: 200 mg via INTRAVENOUS

## 2020-06-19 MED ORDER — MIDAZOLAM HCL 5 MG/5ML IJ SOLN
INTRAMUSCULAR | Status: DC | PRN
  Administered 2020-06-19: 2 mg via INTRAVENOUS

## 2020-06-19 MED ORDER — NALOXONE HCL 0.4 MG/ML IJ SOLN: 0.4000 mg | INTRAMUSCULAR | Status: DC | PRN

## 2020-06-19 MED ORDER — PHENYLEPHRINE HCL-NACL 10-0.9 MG/250ML-% IV SOLN
INTRAVENOUS | Status: DC | PRN
  Administered 2020-06-19: 50 ug/min via INTRAVENOUS

## 2020-06-19 MED ORDER — PROMETHAZINE HCL 25 MG/ML IJ SOLN: 6.2500 mg | INTRAMUSCULAR | Status: DC | PRN

## 2020-06-19 MED ORDER — KETOROLAC TROMETHAMINE 60 MG/2ML IM SOLN
INTRAMUSCULAR | Status: DC | PRN
  Administered 2020-06-19: 60 mg via INTRAMUSCULAR

## 2020-06-19 MED ORDER — HYDROMORPHONE 1 MG/ML IV SOLN
INTRAVENOUS | Status: DC
  Administered 2020-06-19: 0.9 mg via INTRAVENOUS
  Administered 2020-06-19: 1.1 mg via INTRAVENOUS
  Administered 2020-06-19: 30 mg via INTRAVENOUS
  Administered 2020-06-20: 1.2 mg via INTRAVENOUS
  Filled 2020-06-19 (×2): qty 30

## 2020-06-19 MED ORDER — LIDOCAINE 2% (20 MG/ML) 5 ML SYRINGE
INTRAMUSCULAR | Status: DC | PRN
  Administered 2020-06-19: 100 mg via INTRAVENOUS

## 2020-06-19 MED ORDER — PHENYLEPHRINE 40 MCG/ML (10ML) SYRINGE FOR IV PUSH (FOR BLOOD PRESSURE SUPPORT)
PREFILLED_SYRINGE | INTRAVENOUS | Status: DC | PRN
  Administered 2020-06-19 (×2): 120 ug via INTRAVENOUS

## 2020-06-19 MED ORDER — FENTANYL CITRATE (PF) 100 MCG/2ML IJ SOLN
INTRAMUSCULAR | Status: DC | PRN
Start: 2020-06-19 — End: 2020-06-19
  Administered 2020-06-19: 25 ug via INTRAVENOUS
  Administered 2020-06-19 (×2): 50 ug via INTRAVENOUS
  Administered 2020-06-19: 25 ug via INTRAVENOUS
  Administered 2020-06-19: 50 ug via INTRAVENOUS

## 2020-06-19 MED ORDER — PIPERACILLIN-TAZOBACTAM 3.375 G IVPB
3.3750 g | Freq: Once | INTRAVENOUS | Status: AC
  Administered 2020-06-19: 3.375 g via INTRAVENOUS
  Filled 2020-06-19: qty 50

## 2020-06-19 MED ORDER — DEXAMETHASONE SODIUM PHOSPHATE 10 MG/ML IJ SOLN
INTRAMUSCULAR | Status: DC | PRN
  Administered 2020-06-19: 10 mg via INTRAVENOUS

## 2020-06-19 MED ORDER — ORAL CARE MOUTH RINSE: 15.0000 mL | Freq: Once | OROMUCOSAL | Status: AC

## 2020-06-19 MED ORDER — HYDROMORPHONE HCL 1 MG/ML IJ SOLN
0.2500 mg | INTRAMUSCULAR | Status: DC | PRN
  Administered 2020-06-19 (×4): 0.5 mg via INTRAVENOUS

## 2020-06-19 MED ORDER — SCOPOLAMINE 1 MG/3DAYS TD PT72
1.0000 | MEDICATED_PATCH | TRANSDERMAL | Status: DC
  Administered 2020-06-19: 1.5 mg via TRANSDERMAL
  Filled 2020-06-19: qty 1

## 2020-06-19 MED ORDER — ACETAMINOPHEN 500 MG PO TABS
1000.0000 mg | ORAL_TABLET | Freq: Once | ORAL | Status: AC
  Administered 2020-06-19: 1000 mg via ORAL
  Filled 2020-06-19: qty 2

## 2020-06-19 NOTE — Progress Notes (Signed)
Patient admitted for pain control for overnight observation following IR procedure. Patient is awake, alert, no apparent discomfort or distress observed. Her significant other is at the bedside and she has been eating and drinking. Her pain appears well-controlled on dilaudid PCA. She does endorse 5/10 pain with palpation around puncture sites; dressings are clean and dry. IR tentatively planning to discharge the patient tomorrow. Please call IR with any questions/concerns.   Soyla Dryer, [place] [phone] 06/19/2020, 4:43 PM

## 2020-06-19 NOTE — Sedation Documentation (Signed)
Anesthesia in to sedate and monitor. 

## 2020-06-19 NOTE — Transfer of Care (Signed)
Immediate Anesthesia Transfer of Care Note  Patient: Norma Colon  Procedure(s) Performed: Procedure(s): CT WITH ANESTHESIA MICROWAVE ABLATION (N/A)  Patient Location: PACU  Anesthesia Type:General  Level of Consciousness: Alert, Awake, Oriented  Airway & Oxygen Therapy: Patient Spontanous Breathing  Post-op Assessment: Report given to RN  Post vital signs: Reviewed and stable  Last Vitals:  Vitals:   06/19/20 0655  BP: 137/80  Pulse: 94  Resp: 16  Temp: 36.8 C  SpO2: 86%    Complications: No apparent anesthesia complications

## 2020-06-19 NOTE — Anesthesia Procedure Notes (Signed)
Procedure Name: Intubation Date/Time: 06/19/2020 8:21 AM Performed by: Gerald Leitz, CRNA Pre-anesthesia Checklist: Patient identified, Patient being monitored, Timeout performed, Emergency Drugs available and Suction available Patient Re-evaluated:Patient Re-evaluated prior to induction Oxygen Delivery Method: Circle system utilized Preoxygenation: Pre-oxygenation with 100% oxygen Induction Type: IV induction Ventilation: Mask ventilation without difficulty Laryngoscope Size: Mac and 3 Grade View: Grade I Tube type: Oral Tube size: 7.0 mm Number of attempts: 1 Placement Confirmation: ETT inserted through vocal cords under direct vision,  positive ETCO2 and breath sounds checked- equal and bilateral Secured at: 21 cm Tube secured with: Tape Dental Injury: Teeth and Oropharynx as per pre-operative assessment

## 2020-06-19 NOTE — Procedures (Signed)
Interventional Radiology Procedure Note  Procedure: [place] and CT guided biopsy of liver mass in segment 6 followed by MWA of lesions in segment 6 and 3.   Complications: None  Estimated Blood Loss: None  Recommendations: - Bed rest x 4 hrs - Pain control - DC home vs. Admit for obs - Path sent   Signed,  Criselda Peaches, MD

## 2020-06-19 NOTE — H&P (Signed)
Chief Complaint: Patient was seen in consultation today for liver lesions; CT-guided tissue ablation with biopsy  Referring Physician(s): Dr. Benay Spice  Supervising Physician: Jacqulynn Cadet  Patient Status: [facility] - Out-pt  History of Present Illness: Norma Colon is a 59 y.o. female with a medical history significant for rectal adenocarcinoma with liver metastases. A partially obstructing rectal mass was first identified August 2019. She underwent neoadjuvant radiation and chemotherapy followed by a low anterior resection December 2019. She underwent an additional four cycles of chemotherapy which was completed March 2020. Surveillance imaging for approximately one year showed no evidence of disease. CT imaging July 2021 unfortunately showed a liver lesion concerning for metastatic disease. MRI August 2021 confirmed the presence of a 2.1 cm solid liver lesion as well as an additional 4 mm lesion.   Norma Colon met with Dr. Laurence Ferrari via a tele-visit on 06/06/20 to discuss possible treatment options.    Past Medical History:  Diagnosis Date  . Adenocarcinoma of rectum [place] oncologist-- dr Benay Spice--  per last note in epic ,  clinical remission   dx 08/ 2019---- chemoradiation concurrent completed 07-20-2018;  s/p  low anterior resection 09-16-2018;   chemo 10-17-2018  ot 12-19-2018  . Anxiety   . Arthritis   . Chemotherapy induced neutropenia ([place])   . Depression   . Headache    migraines   . History of HPV infection   . History of kidney stones   . Hydronephrosis, right   . Restless leg syndrome   . Scoliosis   . Sepsis ([place])    11-2018    Past Surgical History:  Procedure Laterality Date  . COLPOSCOPY  04/04/2018  . CYSTOSCOPY WITH RETROGRADE PYELOGRAM, URETEROSCOPY AND STENT PLACEMENT Right 12/12/2018   Procedure: CYSTOSCOPY WITH RETROGRADE PYELOGRAM, URETEROSCOPY, STONE EXTRACTION AND STENT PLACEMENT;  Surgeon: Cleon Gustin, MD;  Location: [facility];   Service: Urology;  Laterality: Right;  . CYSTOSCOPY WITH RETROGRADE PYELOGRAM, URETEROSCOPY AND STENT PLACEMENT Right 04/17/2019   Procedure: CYSTOSCOPY WITH RETROGRADE PYELOGRAM, BALLOON [place]  URETEROSCOPY AND STENT PLACEMENT;  Surgeon: Cleon Gustin, MD;  Location: [facility];  Service: Urology;  Laterality: Right;  . CYSTOSCOPY WITH STENT PLACEMENT Right 11/21/2018   Procedure: CYSTOSCOPY, RIGHT RETROGRADE PYELOGRAM, WITH RIGHT URETERAL STENT PLACEMENT;  Surgeon: Cleon Gustin, MD;  Location: [facility];  Service: Urology;  Laterality: Right;  . IR NEPHROSTOMY PLACEMENT RIGHT  06/22/2019  . IR PATIENT EVAL TECH 0-60 MINS  06/28/2019  . IR RADIOLOGIST EVAL & MGMT  06/06/2020  . left forearm surgery due to calcium rock     . ROBOT ASSISTED PYELOPLASTY Right 08/03/2019   Procedure: XI ROBOTIC ASSISTED PYELOPLASTY WITH STENT PLACEMENT;  Surgeon: Cleon Gustin, MD;  Location: [facility];  Service: Urology;  Laterality: Right;  3 HRS  . SHOULDER ARTHROSCOPY W/ ROTATOR CUFF REPAIR Right   . XI ROBOTIC ASSISTED LOWER ANTERIOR RESECTION N/A 09/16/2018   Procedure: XI ROBOTIC ASSISTED LOWER ANTERIOR RESECTION ERAS PATHWAY;  Surgeon: Leighton Ruff, MD;  Location: [facility];  Service: General;  Laterality: N/A;    Allergies: Codeine  Medications: Prior to Admission medications   Medication Sig Start Date End Date Taking? Authorizing Provider  alprazolam Duanne Moron) 2 MG tablet Take 2 mg by mouth every morning.     [provider]  Ascorbic Acid (VITAMIN C PO) Take 1 tablet by mouth every morning.    [provider]  aspirin EC 81 MG tablet Take 81  mg by mouth every morning. Swallow whole.    [provider]  BIOTIN PO Take 1 tablet by mouth every morning.    [provider]  bisacodyl (BISACODYL) 5 MG EC tablet Take 10 mg by mouth in the morning and at bedtime.     [provider]  buPROPion (WELLBUTRIN XL) 300 MG 24 hr  tablet Take 300 mg by mouth every morning.     [provider]  Calcium Carbonate-Vitamin D (CALCIUM-D PO) Take 1 tablet by mouth See admin instructions. Take 1 tablet by mouth of either calcium or calcium +D every morning    [provider]  CALCIUM PO Take 1 tablet by mouth See admin instructions. Take 1 tablet by mouth of either calcium or calcium +D every morning    [provider]  Cholecalciferol (VITAMIN D3 PO) Take 1 tablet by mouth every morning.    [provider]  cyclobenzaprine (FLEXERIL) 10 MG tablet Take 10 mg by mouth at bedtime.     [provider]  HYDROcodone-acetaminophen (NORCO) 10-325 MG tablet Take 1-2 tablets by mouth every 6 (six) hours as needed for moderate pain or severe pain (headache). Post-Operatively. Patient taking differently: Take 1-2 tablets by mouth every 4 (four) hours as needed for moderate pain or severe pain (headache). Post-Operatively. 08/05/19   Alexis Frock, MD  MAGNESIUM CITRATE PO Take 0.5 Bottles by mouth every morning.     [provider]  MAGNESIUM PO Take 1 tablet by mouth every morning.    [provider]  Multiple Vitamin (MULTIVITAMIN WITH MINERALS) TABS tablet Take 1 tablet by mouth every morning.    [provider]  Prenatal Vit-Fe Fumarate-FA (PRENATAL VITAMIN PO) Take 1 tablet by mouth every morning.    [provider]  Probiotic Product (PROBIOTIC PO) Take 1 tablet by mouth every morning.    [provider]  psyllium (METAMUCIL) 58.6 % powder Take 1 packet by mouth daily.    [provider]  rOPINIRole (REQUIP) 0.25 MG tablet Take 0.25 mg by mouth at bedtime.    [provider]  Sennosides-Docusate Sodium (SENNA-DOCUSATE SODIUM PO) Take 2 tablets by mouth every morning. Ducolax Brand     [provider]  Simethicone (GAS-X PO) Take 1 tablet by mouth every morning.     [provider]     Family History  Problem  Relation Age of Onset  . COPD Father        smoker  . Stomach cancer Father        spread to lungs and bones  . Arthritis Mother   . Heart attack Paternal Grandmother   . Heart attack Paternal Grandfather   . Heart attack Maternal Grandmother   . Stroke Maternal Grandmother   . Colon cancer Neg Hx   . Rectal cancer Neg Hx   . Esophageal cancer Neg Hx     Social History   Socioeconomic History  . Marital status: Legally Separated    Spouse name: Not on file  . Number of children: 1  . Years of education: Not on file  . Highest education level: Not on file  Occupational History  . Occupation: Land  Tobacco Use  . Smoking status: Former Smoker    Packs/day: 1.00    Years: 30.00    Pack years: 30.00    Types: Cigarettes    Quit date: 09/16/2018    Years since quitting: 1.7  . Smokeless tobacco: Never Used  Vaping Use  . Vaping Use: Never used  Substance and Sexual Activity  . Alcohol use: Yes    Comment: social  . Drug use: Never  . Sexual activity: Yes  Other Topics Concern  . Not on file  Social History Narrative  . Not on file   Social Determinants of Health   Financial Resource Strain:   . Difficulty of Paying Living Expenses: Not on file  Food Insecurity:   . Worried About Charity fundraiser in the Last Year: Not on file  . Ran Out of Food in the Last Year: Not on file  Transportation Needs:   . Lack of Transportation (Medical): Not on file  . Lack of Transportation (Non-Medical): Not on file  Physical Activity:   . Days of Exercise per Week: Not on file  . Minutes of Exercise per Session: Not on file  Stress:   . Feeling of Stress : Not on file  Social Connections:   . Frequency of Communication with Friends and Family: Not on file  . Frequency of Social Gatherings with Friends and Family: Not on file  . Attends Religious Services: Not on file  . Active Member of Clubs or Organizations: Not on file  . Attends Archivist Meetings:  Not on file  . Marital Status: Not on file    Review of Systems: A 12 point ROS discussed and pertinent positives are indicated in the HPI above.  All other systems are negative.  Review of Systems  Constitutional: Negative for appetite change and fatigue.  Respiratory: Negative for cough and shortness of breath.   Cardiovascular: Negative for chest pain and leg swelling.  Gastrointestinal: Positive for constipation. Negative for abdominal distention, abdominal pain, nausea and vomiting.  Musculoskeletal: Negative for back pain.  Neurological: Negative for headaches.    Vital Signs: Temp: 98.2, BP 137/80, HR 94, RR 16, O2 98% on RA  Physical Exam Constitutional:      General: She is not in acute distress. HENT:     Mouth/Throat:     Mouth: Mucous membranes are moist.     Pharynx: Oropharynx is clear.  Cardiovascular:     Rate and Rhythm: Normal rate and regular rhythm.     Pulses: Normal pulses.     Heart sounds: Normal heart sounds.  Pulmonary:     Effort: Pulmonary effort is normal.     Breath sounds: Normal breath sounds.  Abdominal:     General: Bowel sounds are normal.     Palpations: Abdomen is soft.  Musculoskeletal:        General: Normal range of motion.  Skin:    General: Skin is warm and dry.  Neurological:     Mental Status: She is alert and oriented to person, place, and time.     Imaging: IR Radiologist Eval & Mgmt  Result Date: 06/06/2020 Please refer to notes tab for details about interventional procedure. (Op Note)   Labs:  CBC: Recent Labs    06/22/19 0821 06/22/19 0821 07/27/19 1404 07/27/19 1404 08/03/19 1553 08/04/19 0444 06/17/20 1513 06/19/20 0715  WBC 6.3  --  6.1  --   --   --  5.7 4.6  HGB 12.4   < > 12.3   < > 12.2 11.6* 13.7 12.7  HCT 36.4   < > 38.5   < > 38.1 36.8 42.7 38.2  PLT 242  --  231  --   --   --  270 221   < > =  values in this interval not displayed.    COAGS: Recent Labs    06/22/19 0821 06/19/20 0715   INR 1.2 1.1    BMP: Recent Labs    08/04/19 0444 10/27/19 1148 04/11/20 1102 06/17/20 1513  NA 136 140 142 139  K 4.5 3.8 4.1 4.7  CL 104 104 104 103  CO2 _0 GLUCOSE 174* 114* 103* 95  BUN _1 22*  CALCIUM 8.8* 9.1 9.7 9.5  CREATININE 1.22* 1.05* 1.21* 0.88  GFRNONAA 50* 59* 50* >60  GFRAA 58* >60 58* >60    LIVER FUNCTION TESTS: Recent Labs    06/17/20 1513  BILITOT 0.6  AST 45*  ALT 58*  ALKPHOS 102  PROT 8.0  ALBUMIN 4.1    TUMOR MARKERS: No results for input(s): AFPTM, CEA, CA199, CHROMGRNA in the last 8760 hours.  Assessment and Plan:  Rectal cancer with possible liver metastases: Norma Colon, 59 year old female, presents today to the [facility] department for a CT-guided tissue ablation and biopsy of the liver lesions identified on recent imaging.   Risks and benefits discussed with the patient including, but not limited to bleeding, infection, liver failure, bile duct injury, pneumothorax or damage to adjacent structures.  All of the patient's questions were answered, patient is agreeable to proceed.  She has been NPO. Labs and vitals have been reviewed. She is not on any blood-thinning medications.   Consent signed and in chart.  Thank you for this interesting consult.  I greatly enjoyed meeting Norma Colon and look forward to participating in their care.  A copy of this report was sent to the requesting provider on this date.  Electronically Signed: Soyla Dryer, AGACNP-BC [phone] 06/19/2020, 8:00 AM   I spent a total of  30 Minutes   in face to face in clinical consultation, greater than 50% of which was counseling/coordinating care for CT-guided tissue ablation and biopsy.

## 2020-06-20 ENCOUNTER — Encounter (HOSPITAL_COMMUNITY): Payer: Self-pay | Admitting: Interventional Radiology

## 2020-06-20 ENCOUNTER — Telehealth: Payer: Self-pay | Admitting: *Deleted

## 2020-06-20 DIAGNOSIS — C787 Secondary malignant neoplasm of liver and intrahepatic bile duct: Secondary | ICD-10-CM | POA: Diagnosis not present

## 2020-06-20 LAB — SURGICAL PATHOLOGY

## 2020-06-20 MED ORDER — OXYCODONE HCL 5 MG PO TABS
5.0000 mg | ORAL_TABLET | Freq: Four times a day (QID) | ORAL | 0 refills | Status: DC | PRN
Start: 2020-06-20 — End: 2020-08-16

## 2020-06-20 MED ORDER — HYDROCODONE-ACETAMINOPHEN 5-325 MG PO TABS
1.0000 | ORAL_TABLET | ORAL | Status: DC | PRN
  Administered 2020-06-20: 1 via ORAL
  Filled 2020-06-20: qty 1

## 2020-06-20 NOTE — Plan of Care (Signed)
  Problem: Education: Goal: Knowledge of General Education information will improve Description: Including pain rating scale, medication(s)/side effects and non-pharmacologic comfort measures Outcome: Progressing   Problem: Clinical Measurements: Goal: Ability to maintain clinical measurements within normal limits will improve Outcome: Progressing Goal: Respiratory complications will improve Outcome: Progressing Goal: Cardiovascular complication will be avoided Outcome: Progressing   Problem: Activity: Goal: Risk for activity intolerance will decrease Outcome: Progressing   Problem: Nutrition: Goal: Adequate nutrition will be maintained Outcome: Progressing   Problem: Coping: Goal: Level of anxiety will decrease Outcome: Progressing   Problem: Elimination: Goal: Will not experience complications related to urinary retention Outcome: Progressing   Problem: Pain Managment: Goal: General experience of comfort will improve Outcome: Progressing   Problem: Safety: Goal: Ability to remain free from injury will improve Outcome: Progressing   Problem: Skin Integrity: Goal: Risk for impaired skin integrity will decrease Outcome: Progressing

## 2020-06-20 NOTE — Telephone Encounter (Signed)
Called to report she is leaving now for home and will pick up script for oxycodone that she reports she had to "demand". PA had instructed her to use Hydrocodone for pain, but she insists this will only work on her headache pain and not surgical pain. Also upset that they would not check her urine--she thinks she has a UTI. She called her GYN office and an antibiotic was called in. Expressed displeasure that the physician could not discharge her and sent a PA instead. Was told to F/U in 1 month, but she insists on being seen in 1 week. Wants to know specifics of procedure, if it worked and what is her prognosis. Wants to speak w/Dr. Benay Spice.

## 2020-06-20 NOTE — Discharge Instructions (Signed)
Microwave Ablation of Liver Tumors, Care After This sheet gives you information about how to care for yourself after your procedure. Your health care provider may also give you more specific instructions. If you have problems or questions, contact your health care provider. What can I expect after the procedure? After your procedure, it is common to have pain and discomfort in the upper abdomen. You will be given pain medicines to control this. Follow these instructions at home: Incision care  Follow instructions from your health care provider about how to take care of your incision. Make sure you: ? Wash your hands with soap and water before you change your bandage (dressing). If soap and water are not available, use hand sanitizer. ? Ok to shower 48 hours post-procedure. Recommend showering with bandage on, remove bandage immediately after showering and pat area dry. No further dressing changes needed after this- ensure area remains clean/dry until fully healed. ? No submerging (swimming, bathing) for 7 days post-procedure.  Check your incision area every day for signs of infection. Check for: ? Redness, swelling, or pain. ? Fluid or blood. ? Warmth. ? Pus or a bad smell. Medicines  Take Oxycodone 5 mg tablets x1 by mouth once every 6 hours as needed for moderate-severe pain. Do not drive or use heavy machinery while taking this medication. Activity  Do not lift anything that is heavier than 10 lbs (4.5 kg) for 1 week. General instructions  To prevent or treat constipation while you are taking prescription pain medicine, your health care provider may recommend that you: ? Drink enough fluid to keep your urine clear or pale yellow. ? Take over-the-counter or prescription medicines. ? Eat foods that are high in fiber, such as fresh fruits and vegetables, whole grains, and beans. ? Limit foods that are high in fat and processed sugars, such as fried and sweet foods.  Do not use any  products that contain nicotine or tobacco, such as cigarettes and e-cigarettes. If you need help quitting, ask your health care provider.  Keep all follow-up visits as told by your health care provider. This is important. 4 week televisit follow-up with Dr. Laurence Ferrari. Contact a health care provider if:  You cannot pass gas.  You are unable to have a bowel movement within 2 days.  You have a skin rash. Get help right away if:  You have a fever.  You have severe or lasting pain in your abdomen, shoulder, or back.  You have trouble swallowing or breathing.  You have severe weakness or dizziness.  You have chest pain or shortness of breath.  This information is not intended to replace advice given to you by your health care provider. Make sure you discuss any questions you have with your health care provider. Document Revised: 09/03/2017 Document Reviewed: 12/10/2016 Elsevier Patient Education  [place].

## 2020-06-20 NOTE — Progress Notes (Addendum)
IR.  Patient underwent an image-guided liver mass biopsy follows by microwave ablation of liver lesions 06/19/2020 by Dr. Laurence Ferrari.  Went to evaluate patient bedside. Patient awake and alert sitting in bed watching TV and eating breakfast. States her "pain in the front" (from our procedure) currently rated 4/10, states her "pain in the back" that she describes as a "hot poker sticking me" and has been "present since my cancer surgery" is rated 10/10. Also complaining of "period cramps"- states these were present prior to procedure. Currently on PCA Dilaudid- states "does not help me". Denies N/V and again eating breakfast without difficulties. RUQ incisions soft without active bleeding or hematoma.  Discussed procedure along with possible discharge planning with patient. Plan to D/C PCA and start Norco 5-325 Q4PRN (patient states she takes this at home- does not cause same "allergy" as codeine). Amy, RN aware of medication changes. Patient conveys understanding and agrees with plan.  IR to follow.   Bea Graff Lavontae Cornia, PA-C 06/20/2020, 9:10 AM

## 2020-06-20 NOTE — Discharge Summary (Signed)
Patient ID: Norma Colon MRN: [id number] DOB/AGE: Jun 11, 1961 59 y.o.  Admit date: 06/19/2020 Discharge date: 06/20/2020  Supervising Physician: Arne Cleveland  Patient Status: [name]  Admission Diagnoses: Rectal cancer metastasized to liver  Discharge Diagnoses:  Active Problems:   Rectal cancer metastasized to liver [facility])   Discharged Condition: stable  Hospital Course:  Patient presented to [facility] 06/19/2020 for an image-guided liver mass biopsy along with microwave ablation of liver lesions by Dr. Laurence Ferrari. Procedure occurred without major complications and patient was transferred to floor in stable condition (VSS, LUQ puncture sites stable) for overnight observation secondary to pain. No major events occurred overnight.  Patient awake and alert sitting in bed this AM. States PCA "does not help". She was switched to oral medications, however requests a different oral medication on D/C as "Norco is my headache medication" that "will not help this pain". RUQ puncture sites stable. Eating breakfast without difficulty. Plan to discharge home today and follow-up with Dr. Laurence Ferrari in 4 weeks for televisit.  Oxycodone 5 mg tablets e-prescribed for patient- take one tablet by mouth once every 6 hours as needed for moderate-severe pain.  Patient also with pelvic cramping/pain prior to procedure, per patient "I think I am getting a urinary infection". WBCs normal yesterday. Advised patient to follow-up with PCP regarding management.   Consults: None  Significant Diagnostic Studies: DG Chest 1 View  Result Date: 06/19/2020 CLINICAL DATA:  Pre procedure today; no chest complaints today; hx rectal cancer; no known cardiopulmonary problems; ex smoker, quit 2 years ago EXAM: CHEST  1 VIEW COMPARISON:  None. FINDINGS: Normal mediastinum and cardiac silhouette. There is increased infrahilar density on the RIGHT compared to radiograph 2005. Lung bases are clear. Upper lungs clear. No  acute osseous abnormality. IMPRESSION: Increased infrahilar density on the RIGHT compared to 2005. Recommend CT thorax to exclude pulmonary nodules. Electronically Signed   By: Suzy Bouchard M.D.   On: 06/19/2020 08:11   CT GUIDE TISSUE ABLATION  Result Date: 06/19/2020 INDICATION: 59 year old female with a history of rectal adenocarcinoma with liver lesions concerning for hepatic metastatic disease. She presents for combined imaging guided biopsy and percutaneous thermal ablation. EXAM: 1. Ultrasound-guided liver biopsy 2. CT and ultrasound-guided percutaneous mic wave ablation of lesions in hepatic segments 6 and hepatic segment 3. COMPARISON:  Abdominal MRI 05/08/2020 MEDICATIONS: 3.375 g Zosyn; The antibiotic was administered in an appropriate time interval prior to needle puncture of the skin. ANESTHESIA/SEDATION: General - as administered by the Anesthesia department FLUOROSCOPY TIME:  None. COMPLICATIONS: None immediate. TECHNIQUE: Informed written consent was obtained from the patient after a thorough discussion of the procedural risks, benefits and alternatives. All questions were addressed. Maximal Sterile Barrier Technique was utilized including caps, mask, sterile gowns, sterile gloves, sterile drape, hand hygiene and skin antiseptic. A timeout was performed prior to the initiation of the procedure. A planning axial CT scan was performed. The lesion in hepatic segment 6 is identified as a subtle focus of hyperattenuation. The lesion in hepatic segment 3 is occult by CT imaging. Therefore, ultrasound was also used to interrogate the liver. The small 5 mm lesion in hepatic segment 3 was successfully identified sonographically. Additionally, the 2.4 cm lesion in hepatic segment 6 is also well seen sonographically. Biopsy was to be performed first. A small dermatotomy was made. Under real-time ultrasound guidance, a 17 gauge introducer needle was advanced through the liver and positioned at the margin  of the mass. Multiple 18 gauge core biopsies were  then obtained coaxially. Biopsy specimens were placed in formalin and delivered to pathology for further analysis. Next, 2 PR 15 ablation probes were placed, again using ultrasound guidance. The probes were placed in the superior and caudal aspects of the segment 6 mass. Microwave ablation was then performed. Both probes were powered at 65 watts and ablation was continued for approximately 4 minutes in till there was complete encompassing of the lesion on ultrasound imaging. Ablation was then stopped. The probes were removed. Attention was next turned to the small segment 3 lesion. A new dermatotomy was made. Again, using real-time ultrasound imaging, APR 15 probe was carefully advanced through the liver and positioned immediately adjacent to the small lesion. Ablation was then performed at 65 watts for 2 minutes 30 seconds. The ablation zone was monitored continuously with ultrasound imaging given the close proximity to the stomach. Post biopsy CT imaging demonstrates well-positioned ablation zones without evidence of residual mass. No evidence of hemorrhage, or other complication. FINDINGS: Psychiatrist. IMPRESSION: 1. CT/ultrasound guided biopsy of segment 6 hepatic lesion. 2. CT/ultrasound guided percutaneous thermal ablation of segment 6 and segment 3 lesions. Signed, Criselda Peaches, MD, [facility] Radiology Electronically Signed   By: Jacqulynn Cadet M.D.   On: 06/19/2020 14:20   CT BIOPSY  Result Date: 06/19/2020 INDICATION: 59 year old female with a history of rectal adenocarcinoma with liver lesions concerning for hepatic metastatic disease. She presents for combined imaging guided biopsy and percutaneous thermal ablation. EXAM: 1. Ultrasound-guided liver biopsy 2. CT and ultrasound-guided percutaneous mic wave ablation of lesions in hepatic segments 6 and hepatic segment 3. COMPARISON:   Abdominal MRI 05/08/2020 MEDICATIONS: 3.375 g Zosyn; The antibiotic was administered in an appropriate time interval prior to needle puncture of the skin. ANESTHESIA/SEDATION: General - as administered by the Anesthesia department FLUOROSCOPY TIME:  None. COMPLICATIONS: None immediate. TECHNIQUE: Informed written consent was obtained from the patient after a thorough discussion of the procedural risks, benefits and alternatives. All questions were addressed. Maximal Sterile Barrier Technique was utilized including caps, mask, sterile gowns, sterile gloves, sterile drape, hand hygiene and skin antiseptic. A timeout was performed prior to the initiation of the procedure. A planning axial CT scan was performed. The lesion in hepatic segment 6 is identified as a subtle focus of hyperattenuation. The lesion in hepatic segment 3 is occult by CT imaging. Therefore, ultrasound was also used to interrogate the liver. The small 5 mm lesion in hepatic segment 3 was successfully identified sonographically. Additionally, the 2.4 cm lesion in hepatic segment 6 is also well seen sonographically. Biopsy was to be performed first. A small dermatotomy was made. Under real-time ultrasound guidance, a 17 gauge introducer needle was advanced through the liver and positioned at the margin of the mass. Multiple 18 gauge core biopsies were then obtained coaxially. Biopsy specimens were placed in formalin and delivered to pathology for further analysis. Next, 2 PR 15 ablation probes were placed, again using ultrasound guidance. The probes were placed in the superior and caudal aspects of the segment 6 mass. Microwave ablation was then performed. Both probes were powered at 65 watts and ablation was continued for approximately 4 minutes in till there was complete encompassing of the lesion on ultrasound imaging. Ablation was then stopped. The probes were removed. Attention was next turned to the small segment 3 lesion. A new dermatotomy was  made. Again, using real-time ultrasound imaging, APR 15 probe was carefully advanced through the liver and positioned immediately  adjacent to the small lesion. Ablation was then performed at 65 watts for 2 minutes 30 seconds. The ablation zone was monitored continuously with ultrasound imaging given the close proximity to the stomach. Post biopsy CT imaging demonstrates well-positioned ablation zones without evidence of residual mass. No evidence of hemorrhage, or other complication. FINDINGS: Psychiatrist. IMPRESSION: 1. CT/ultrasound guided biopsy of segment 6 hepatic lesion. 2. CT/ultrasound guided percutaneous thermal ablation of segment 6 and segment 3 lesions. Signed, Criselda Peaches, MD, [facility] Radiology Electronically Signed   By: Jacqulynn Cadet M.D.   On: 06/19/2020 14:20   IR Radiologist Eval & Mgmt  Result Date: 06/06/2020 Please refer to notes tab for details about interventional procedure. (Op Note)   Treatments: Image-guided microwave ablation of liver lesions  Discharge Exam: Blood pressure 113/65, pulse 88, temperature 97.7 F (36.5 C), temperature source Oral, resp. rate 18, height 5\' 8"  (1.727 m), weight 220 lb 0.3 oz (99.8 kg), SpO2 94 %. Physical Exam Vitals and nursing note reviewed.  Constitutional:      General: She is not in acute distress.    Appearance: Normal appearance.  Cardiovascular:     Rate and Rhythm: Normal rate and regular rhythm.     Heart sounds: Normal heart sounds. No murmur heard.   Pulmonary:     Effort: Pulmonary effort is normal. No respiratory distress.     Breath sounds: Normal breath sounds. No wheezing.  Skin:    General: Skin is warm and dry.  Neurological:     Mental Status: She is alert and oriented to person, place, and time.     Disposition: Discharge disposition: 01-Home or Self Care       Discharge Instructions    Call MD for:  difficulty breathing,  headache or visual disturbances   Complete by: As directed    Call MD for:  extreme fatigue   Complete by: As directed    Call MD for:  hives   Complete by: As directed    Call MD for:  persistant dizziness or light-headedness   Complete by: As directed    Call MD for:  persistant nausea and vomiting   Complete by: As directed    Call MD for:  redness, tenderness, or signs of infection (pain, swelling, redness, odor or green/yellow discharge around incision site)   Complete by: As directed    Call MD for:  severe uncontrolled pain   Complete by: As directed    Call MD for:  temperature >100.4   Complete by: As directed    Diet - low sodium heart healthy   Complete by: As directed    Discharge instructions   Complete by: As directed    Ok to shower 48 hours post-procedure. Recommend showering with bandage on, remove bandage immediately after showering and pat area dry. No further dressing changes needed after this- ensure areas remain clean/dry until fully healed. No submerging (swimming, bathing) for 7 days post-procedure. Take Oxycodone 5 mg tablet by mouth once every 6 hours as needed for moderate-severe pain. Plan to follow-up with Dr. Laurence Ferrari for televisit 4 weeks after discharge.   Increase activity slowly   Complete by: As directed    Remove dressing in 24 hours   Complete by: As directed    Following first shower. Please see showering instructions under discharge instructions regarding more information on this.     Allergies as of 06/20/2020      Reactions  Codeine Other (See Comments)   Causes migraine--patient requests this be kept on allergy list      Medication List    TAKE these medications   alprazolam 2 MG tablet Commonly known as: XANAX Take 2 mg by mouth every morning.   aspirin EC 81 MG tablet Take 81 mg by mouth every morning. Swallow whole.   BIOTIN PO Take 1 tablet by mouth every morning.   bisacodyl 5 MG EC tablet Generic drug:  bisacodyl Take 10 mg by mouth in the morning and at bedtime.   CALCIUM PO Take 1 tablet by mouth See admin instructions. Take 1 tablet by mouth of either calcium or calcium +D every morning   CALCIUM-D PO Take 1 tablet by mouth See admin instructions. Take 1 tablet by mouth of either calcium or calcium +D every morning   cyclobenzaprine 10 MG tablet Commonly known as: FLEXERIL Take 10 mg by mouth at bedtime.   GAS-X PO Take 1 tablet by mouth every morning.   HYDROcodone-acetaminophen 10-325 MG tablet Commonly known as: NORCO Take 1-2 tablets by mouth every 6 (six) hours as needed for moderate pain or severe pain (headache). Post-Operatively. What changed: when to take this   MAGNESIUM CITRATE PO Take 0.5 Bottles by mouth every morning.   MAGNESIUM PO Take 1 tablet by mouth every morning.   multivitamin with minerals Tabs tablet Take 1 tablet by mouth every morning.   oxyCODONE 5 MG immediate release tablet Commonly known as: Oxy IR/ROXICODONE Take 1 tablet (5 mg total) by mouth every 6 (six) hours as needed for moderate pain or severe pain.   PRENATAL VITAMIN PO Take 1 tablet by mouth every morning.   PROBIOTIC PO Take 1 tablet by mouth every morning.   psyllium 58.6 % powder Commonly known as: METAMUCIL Take 1 packet by mouth daily.   rOPINIRole 0.25 MG tablet Commonly known as: REQUIP Take 0.25 mg by mouth at bedtime.   SENNA-DOCUSATE SODIUM PO Take 2 tablets by mouth every morning. Ducolax Brand   VITAMIN C PO Take 1 tablet by mouth every morning.   VITAMIN D3 PO Take 1 tablet by mouth every morning.   Wellbutrin XL 300 MG 24 hr tablet Generic drug: buPROPion Take 300 mg by mouth every morning.       Follow-up Information    Jacqulynn Cadet, MD Follow up in 4 week(s).   Specialties: Interventional Radiology, Radiology Why: Please follow-up with Dr. Laurence Ferrari for televisit 4 weeks after discharge. Our office will call you to set up this  appointment. Contact information: [address] [place] [address] [phone]                Electronically Signed: Earley Abide, PA-C 06/20/2020, 1:30 PM   I have spent Greater Than 30 Minutes discharging Jacobo Forest.

## 2020-06-20 NOTE — Anesthesia Postprocedure Evaluation (Signed)
Anesthesia Post Note  Patient: Norma Colon  Procedure(s) Performed: CT WITH ANESTHESIA MICROWAVE ABLATION (N/A )     Patient location during evaluation: PACU Anesthesia Type: General Level of consciousness: sedated Pain management: pain level controlled Vital Signs Assessment: post-procedure vital signs reviewed and stable Respiratory status: spontaneous breathing and respiratory function stable Cardiovascular status: stable Postop Assessment: no apparent nausea or vomiting Anesthetic complications: no   No complications documented.                 Braxston Quinter DANIEL

## 2020-06-24 ENCOUNTER — Telehealth: Payer: Self-pay | Admitting: Oncology

## 2020-06-24 ENCOUNTER — Encounter: Payer: Self-pay | Admitting: *Deleted

## 2020-06-24 NOTE — Progress Notes (Signed)
Dr. Benay Spice spoke w/patient today re: recent procedure and f/u. Ordered for OV in 3 weeks. Scheduling message sent.

## 2020-06-24 NOTE — Telephone Encounter (Signed)
Scheduled appointment per 9/20 scheduling message. Spoke with patient who is aware of appointment.

## 2020-07-02 ENCOUNTER — Other Ambulatory Visit: Payer: Self-pay | Admitting: *Deleted

## 2020-07-02 DIAGNOSIS — K769 Liver disease, unspecified: Secondary | ICD-10-CM

## 2020-07-05 ENCOUNTER — Other Ambulatory Visit: Payer: Self-pay | Admitting: *Deleted

## 2020-07-05 DIAGNOSIS — C20 Malignant neoplasm of rectum: Secondary | ICD-10-CM

## 2020-07-05 NOTE — Progress Notes (Signed)
Labs entered for visit on 07/11/20 per request of IR department.

## 2020-07-11 ENCOUNTER — Other Ambulatory Visit: Payer: Self-pay

## 2020-07-11 ENCOUNTER — Inpatient Hospital Stay: Payer: Medicare Other | Attending: Oncology | Admitting: Oncology

## 2020-07-11 ENCOUNTER — Inpatient Hospital Stay: Payer: Medicare Other

## 2020-07-11 VITALS — BP 120/84 | HR 102 | Temp 99.4°F | Resp 17 | Ht 68.0 in | Wt 221.0 lb

## 2020-07-11 DIAGNOSIS — G43909 Migraine, unspecified, not intractable, without status migrainosus: Secondary | ICD-10-CM | POA: Insufficient documentation

## 2020-07-11 DIAGNOSIS — E041 Nontoxic single thyroid nodule: Secondary | ICD-10-CM | POA: Insufficient documentation

## 2020-07-11 DIAGNOSIS — Z720 Tobacco use: Secondary | ICD-10-CM | POA: Insufficient documentation

## 2020-07-11 DIAGNOSIS — C787 Secondary malignant neoplasm of liver and intrahepatic bile duct: Secondary | ICD-10-CM | POA: Diagnosis present

## 2020-07-11 DIAGNOSIS — G2581 Restless legs syndrome: Secondary | ICD-10-CM | POA: Insufficient documentation

## 2020-07-11 DIAGNOSIS — Z87442 Personal history of urinary calculi: Secondary | ICD-10-CM | POA: Diagnosis not present

## 2020-07-11 DIAGNOSIS — C20 Malignant neoplasm of rectum: Secondary | ICD-10-CM

## 2020-07-11 DIAGNOSIS — N132 Hydronephrosis with renal and ureteral calculous obstruction: Secondary | ICD-10-CM | POA: Insufficient documentation

## 2020-07-11 LAB — CBC WITH DIFFERENTIAL (CANCER CENTER ONLY)
Abs Immature Granulocytes: 0.02 10*3/uL (ref 0.00–0.07)
Basophils Absolute: 0 10*3/uL (ref 0.0–0.1)
Basophils Relative: 1 %
Eosinophils Absolute: 0.1 10*3/uL (ref 0.0–0.5)
Eosinophils Relative: 2 %
HCT: 39.6 % (ref 36.0–46.0)
Hemoglobin: 13.3 g/dL (ref 12.0–15.0)
Immature Granulocytes: 1 %
Lymphocytes Relative: 24 %
Lymphs Abs: 1 10*3/uL (ref 0.7–4.0)
MCH: 31.4 pg (ref 26.0–34.0)
MCHC: 33.6 g/dL (ref 30.0–36.0)
MCV: 93.6 fL (ref 80.0–100.0)
Monocytes Absolute: 0.4 10*3/uL (ref 0.1–1.0)
Monocytes Relative: 10 %
Neutro Abs: 2.7 10*3/uL (ref 1.7–7.7)
Neutrophils Relative %: 62 %
Platelet Count: 254 10*3/uL (ref 150–400)
RBC: 4.23 MIL/uL (ref 3.87–5.11)
RDW: 12.8 % (ref 11.5–15.5)
WBC Count: 4.3 10*3/uL (ref 4.0–10.5)
nRBC: 0 % (ref 0.0–0.2)

## 2020-07-11 LAB — CMP (CANCER CENTER ONLY)
ALT: 46 U/L — ABNORMAL HIGH (ref 0–44)
AST: 36 U/L (ref 15–41)
Albumin: 3.6 g/dL (ref 3.5–5.0)
Alkaline Phosphatase: 118 U/L (ref 38–126)
Anion gap: 6 (ref 5–15)
BUN: 18 mg/dL (ref 6–20)
CO2: 28 mmol/L (ref 22–32)
Calcium: 9.8 mg/dL (ref 8.9–10.3)
Chloride: 107 mmol/L (ref 98–111)
Creatinine: 1.14 mg/dL — ABNORMAL HIGH (ref 0.44–1.00)
GFR, Estimated: 54 mL/min — ABNORMAL LOW (ref >60)
Glucose, Bld: 111 mg/dL — ABNORMAL HIGH (ref 70–99)
Potassium: 3.8 mmol/L (ref 3.5–5.1)
Sodium: 141 mmol/L (ref 135–145)
Total Bilirubin: 0.6 mg/dL (ref 0.3–1.2)
Total Protein: 7.6 g/dL (ref 6.5–8.1)

## 2020-07-11 LAB — PROTIME-INR
INR: 1.1 (ref 0.8–1.2)
Prothrombin Time: 14.1 seconds (ref 11.4–15.2)

## 2020-07-11 LAB — CEA (IN HOUSE-CHCC): CEA (CHCC-In House): 2.84 ng/mL (ref 0.00–5.00)

## 2020-07-11 NOTE — Progress Notes (Signed)
[place] OFFICE PROGRESS NOTE   Diagnosis: Rectal cancer  INTERVAL HISTORY:   Norma Colon returns as scheduled.  She underwent thermal ablation of segment 6 and segment 3 liver lesions on 06/19/2020.  Biopsy of the segment 6 lesion revealed metastatic adenocarcinoma.  Norma Colon reports tolerating the procedure well.  Good appetite.  She continues to have irregular bowel habits.  No other complaint.  Objective:  Vital signs in last 24 hours:  Blood pressure 120/84, pulse (!) 102, temperature 99.4 F (37.4 C), temperature source Tympanic, resp. rate 17, height 5' 8"  (1.727 m), weight 221 lb (100.2 kg), SpO2 98 %.    Lymphatics: No cervical, supraclavicular, axillary, or inguinal nodes Resp: Lungs clear bilaterally Cardio: Regular rate and rhythm GI: No hepatosplenomegaly, no mass, nontender Vascular: No leg edema   Lab Results:  Lab Results  Component Value Date   WBC 4.3 07/11/2020   HGB 13.3 07/11/2020   HCT 39.6 07/11/2020   MCV 93.6 07/11/2020   PLT 254 07/11/2020   NEUTROABS 2.7 07/11/2020    CMP  Lab Results  Component Value Date   NA 139 06/17/2020   K 4.7 06/17/2020   CL 103 06/17/2020   CO2 27 06/17/2020   GLUCOSE 95 06/17/2020   BUN 22 (H) 06/17/2020   CREATININE 0.88 06/17/2020   CALCIUM 9.5 06/17/2020   PROT 8.0 06/17/2020   ALBUMIN 4.1 06/17/2020   AST 45 (H) 06/17/2020   ALT 58 (H) 06/17/2020   ALKPHOS 102 06/17/2020   BILITOT 0.6 06/17/2020   GFRNONAA >60 06/17/2020   GFRAA >60 06/17/2020    Lab Results  Component Value Date   CEA1 3.28 04/11/2020    Medications: I have reviewed the patient's current medications.   Assessment/Plan: 1.  Rectal cancer ? 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 CTs 05/24/2018-mass beginning at proximal he 7.6 cm from the anal verge, "20 "perirectal lymph nodes,  no evidence of metastatic disease ? Pelvic MRI 06/01/2018-tumor measured at 10.8 cm from the anal sphincter, T3b, N1-2 left perirectal lymph nodes, each measuring 5 mm ? Radiation and Xeloda initiated 06/13/2018, completed 07/20/2018 ? Low anterior resection 09/16/2018,ypT3,ypN1c, 1 cm tumor, partial response-score 2, 0/13 lymph nodes positive, 2 tumor deposits, MSI-stable, no loss of mismatch repair protein expression ? Cycle 1 CAPOX 10/17/2018 ? Cycle 2 CAPOX 11/07/2018 (oxaliplatin dose reduced due to neutropenia) ? Cycle 3 CAPOX 11/28/2018 ? Cycle 4 CAPOX 12/19/2018 ? CTs 05/29/2019-no evidence of metastatic disease, severe right hydronephrosis, left thyroid nodule ? CTs 04/11/2020-subtle nodular lesion in the inferior right liver suspicious for a new metastasis, status post right ureteral resection and psoas hitch/flap, resolution of hydronephrosis, sacral insufficiency fractures bilaterally ? MRI liver 05/08/2020-2.1 cm segment 6 lesion suspicious for a metastasis, 4 mm suspicious segment 3 lesion 4 mm segment 4B lesion consistent with a hemangioma, no abdominal lymphadenopathy, cholelithiasis ? Ablation of segment 6 and segment 3 lesions 06/19/2020, pathology from segment 6 biopsy confirmed metastatic adenocarcinoma 2. Tobacco use 3. 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 Alyson Ingles, urology 4. Migraine headaches 5. Restless legs 6. Arm pain during the oxaliplatin infusion and for 1 week after cycle 1.  The oxaliplatin will be further diluted  and infusion time extended getting with cycle 2. 7. Neutropenia secondary to chemotherapy.  Oxaliplatin dose reduced beginning with cycle 2. 8. Diarrhea following cycle 2 CAPOX- Xeloda dose reduced with cycle 3 9. 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 Right hydronephrosis on CT 05/29/2019-referred to Dr. Alyson Ingles, surgical reimplantation of the right ureter on 08/03/2019, right ureter stent removed 09/14/2019 11. Thyroid nodules-new versus enlarged left thyroid nodule CT 05/29/2019, thyroid ultrasound 06/20/2019-multiple nodules, 1 right-sided nodule met criteria for ultrasound follow-up  Ultrasound 05/08/2020-multinodular goiter, stable nodules in the isthmus and right thyroid meeting criteria for 1 year follow-up, additional stable nodules do not meet criteria for follow-up   Disposition: Norma Colon is in clinical remission from rectal cancer.  She tolerated the ablation procedure well.  She will be scheduled for restaging CTs and an office visit in January.  She is scheduled to follow-up with Dr. Laurence Ferrari next week.    Betsy Coder, MD  07/11/2020  11:35 AM

## 2020-07-12 ENCOUNTER — Telehealth: Payer: Self-pay | Admitting: Oncology

## 2020-07-12 NOTE — Telephone Encounter (Signed)
Scheduled appointments per 10/7 los. Spoke to patient who is aware of appointments dates and times.

## 2020-07-17 ENCOUNTER — Encounter: Payer: Self-pay | Admitting: *Deleted

## 2020-07-17 ENCOUNTER — Ambulatory Visit
Admission: RE | Admit: 2020-07-17 | Discharge: 2020-07-17 | Disposition: A | Discharge status: Home / Self Care | Payer: 59 | Source: Ambulatory Visit | Attending: Student | Admitting: Student

## 2020-07-17 ENCOUNTER — Other Ambulatory Visit: Payer: Self-pay

## 2020-07-17 DIAGNOSIS — C20 Malignant neoplasm of rectum: Secondary | ICD-10-CM

## 2020-07-17 HISTORY — PX: IR RADIOLOGIST EVAL & MGMT: IMG5224

## 2020-07-17 NOTE — Progress Notes (Signed)
Referring Physician(s): Ronney Lion  Chief Complaint: The patient is seen in follow up today s/p segment VI biopsy followed by microwave ablation, and segment III microwave ablation on 06/19/20.  History of present illness: Norma Colon is a 59 y.o. female who initially presented at the kind request of Dr. 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  Norma Colon was initially diagnosed with a partially obstructing rectal mass in August 2019.  She underwent neoadjuvant combined radiation and chemotherapy (Xeloda) from September through October 2019 followed by a low anterior resection in December 2019.    Subsequently, she underwent 4 cycles of additional adjuvant chemotherapy with CAPOX completed in March 2020.  Subsequent surveillance imaging initially detected no evidence of metastatic disease.  Unfortunately, CT imaging from July 2021 demonstrated a subtle lesion in hepatic segment 6 concerning for metastatic disease.  A follow-up MRI dated 05/08/2020 confirmed a 2.1 cm solid lesion in hepatic segment 6 as well as a concerning for millimeter lesion in the posterior aspect of segment 3.   On 06/19/20 she underwent CT and ultrasound guided biopsy followed immediately by microwave ablation of the segment 6 mass as well as microwave ablation of the suspicious focus in segment 3.    She tolerated the procedure very well, stating that she was ready to get up and mow the grass the following day.  No pain, nausea, vomiting.  She expresses frustration regarding her overall care and multiple health issues she is dealing with including her metastatic disease, her enlarging thyroid nodules, a suspicious area on recent chest radiograph, and her back pain attributed to sequela of right nephrolithiasis.    Past Medical History:  Diagnosis Date  . Adenocarcinoma of rectum [facility]) oncologist-- dr Benay Spice--  per last note in epic ,  clinical  remission   dx 08/ 2019---- chemoradiation concurrent completed 07-20-2018;  s/p  low anterior resection 09-16-2018;   chemo 10-17-2018  ot 12-19-2018  . Anxiety   . Arthritis   . Chemotherapy induced neutropenia ([place])   . Depression   . Headache    migraines   . History of HPV infection   . History of kidney stones   . Hydronephrosis, right   . Restless leg syndrome   . Scoliosis   . Sepsis ([place])    11-2018    Past Surgical History:  Procedure Laterality Date  . COLPOSCOPY  04/04/2018  . CYSTOSCOPY WITH RETROGRADE PYELOGRAM, URETEROSCOPY AND STENT PLACEMENT Right 12/12/2018   Procedure: CYSTOSCOPY WITH RETROGRADE PYELOGRAM, URETEROSCOPY, STONE EXTRACTION AND STENT PLACEMENT;  Surgeon: Cleon Gustin, MD;  Location: [facility];  Service: Urology;  Laterality: Right;  . CYSTOSCOPY WITH RETROGRADE PYELOGRAM, URETEROSCOPY AND STENT PLACEMENT Right 04/17/2019   Procedure: CYSTOSCOPY WITH RETROGRADE PYELOGRAM, BALLOON [place]  URETEROSCOPY AND STENT PLACEMENT;  Surgeon: Cleon Gustin, MD;  Location[facility];  Service: Urology;  Laterality: Right;  . CYSTOSCOPY WITH STENT PLACEMENT Right 11/21/2018   Procedure: CYSTOSCOPY, RIGHT RETROGRADE PYELOGRAM, WITH RIGHT URETERAL STENT PLACEMENT;  Surgeon: Cleon Gustin, MD;  Location: [facility];  Service: Urology;  Laterality: Right;  . IR NEPHROSTOMY PLACEMENT RIGHT  06/22/2019  . IR PATIENT EVAL TECH 0-60 MINS  06/28/2019  . IR RADIOLOGIST EVAL & MGMT  06/06/2020  . left forearm surgery due to calcium rock     . RADIOLOGY WITH ANESTHESIA N/A 06/19/2020   Procedure: CT WITH ANESTHESIA MICROWAVE ABLATION;  Surgeon: Jacqulynn Cadet, MD;  Location: [facility];  Service: Anesthesiology;  Laterality: N/A;  . ROBOT ASSISTED PYELOPLASTY Right 08/03/2019   Procedure: XI ROBOTIC ASSISTED PYELOPLASTY WITH STENT PLACEMENT;  Surgeon: Cleon Gustin, MD;  Location: [facility];  Service: Urology;  Laterality:  Right;  3 HRS  . SHOULDER ARTHROSCOPY W/ ROTATOR CUFF REPAIR Right   . XI ROBOTIC ASSISTED LOWER ANTERIOR RESECTION N/A 09/16/2018   Procedure: XI ROBOTIC ASSISTED LOWER ANTERIOR RESECTION ERAS PATHWAY;  Surgeon: Leighton Ruff, MD;  Location: [facility];  Service: General;  Laterality: N/A;    Allergies: Codeine  Medications: Prior to Admission medications   Medication Sig Start Date End Date Taking? Authorizing Provider  alprazolam Duanne Moron) 2 MG tablet Take 2 mg by mouth every morning.     [provider]  Ascorbic Acid (VITAMIN C PO) Take 1 tablet by mouth every morning. Patient not taking: Reported on 07/11/2020    [provider]  aspirin EC 81 MG tablet Take 81 mg by mouth every morning. Swallow whole. Patient not taking: Reported on 07/11/2020    [provider]  BIOTIN PO Take 1 tablet by mouth every morning. Patient not taking: Reported on 07/11/2020    [provider]  bisacodyl (BISACODYL) 5 MG EC tablet Take 10 mg by mouth in the morning and at bedtime.     [provider]  buPROPion (WELLBUTRIN XL) 300 MG 24 hr tablet Take 300 mg by mouth every morning.     [provider]  Calcium Carbonate-Vitamin D (CALCIUM-D PO) Take 1 tablet by mouth See admin instructions. Take 1 tablet by mouth of either calcium or calcium +D every morning Patient not taking: Reported on 07/11/2020    [provider]  CALCIUM PO Take 1 tablet by mouth See admin instructions. Take 1 tablet by mouth of either calcium or calcium +D every morning Patient not taking: Reported on 07/11/2020    [provider]  Cholecalciferol (VITAMIN D3 PO) Take 1 tablet by mouth every morning. Patient not taking: Reported on 07/11/2020    [provider]  cyclobenzaprine (FLEXERIL) 10 MG tablet Take 10 mg by mouth at bedtime.     [provider]  HYDROcodone-acetaminophen (NORCO) 10-325 MG tablet Take 1-2 tablets by mouth every 6 (six) hours as  needed for moderate pain or severe pain (headache). Post-Operatively. Patient taking differently: Take 1-2 tablets by mouth every 4 (four) hours as needed for moderate pain or severe pain (headache). Post-Operatively. 08/05/19   Alexis Frock, MD  MAGNESIUM CITRATE PO Take 0.5 Bottles by mouth every morning.  Patient not taking: Reported on 07/11/2020    [provider]  MAGNESIUM PO Take 1 tablet by mouth every morning. Patient not taking: Reported on 07/11/2020    [provider]  Multiple Vitamin (MULTIVITAMIN WITH MINERALS) TABS tablet Take 1 tablet by mouth every morning. Patient not taking: Reported on 07/11/2020    [provider]  oxyCODONE (OXY IR/ROXICODONE) 5 MG immediate release tablet Take 1 tablet (5 mg total) by mouth every 6 (six) hours as needed for moderate pain or severe pain. Patient not taking: Reported on 07/11/2020 06/20/20   Ronney Lion, PA-C  Prenatal Vit-Fe Fumarate-FA (PRENATAL VITAMIN PO) Take 1 tablet by mouth every morning. Patient not taking: Reported on 07/11/2020    [provider]  Probiotic Product (PROBIOTIC PO) Take 1 tablet by mouth every morning. Patient not taking: Reported on 07/11/2020    [provider]  psyllium (METAMUCIL) 58.6 % powder  Take 1 packet by mouth daily. Patient not taking: Reported on 07/11/2020    [provider]  rOPINIRole (REQUIP) 0.25 MG tablet Take 0.25 mg by mouth at bedtime.    [provider]  Sennosides-Docusate Sodium (SENNA-DOCUSATE SODIUM PO) Take 2 tablets by mouth every morning. Ducolax Brand  Patient not taking: Reported on 07/11/2020    [provider]  Simethicone (GAS-X PO) Take 1 tablet by mouth every morning.  Patient not taking: Reported on 07/11/2020    [provider]     Family History  Problem Relation Age of Onset  . COPD Father        smoker  . Stomach cancer Father        spread to lungs and bones  . Arthritis Mother   .  Heart attack Paternal Grandmother   . Heart attack Paternal Grandfather   . Heart attack Maternal Grandmother   . Stroke Maternal Grandmother   . Colon cancer Neg Hx   . Rectal cancer Neg Hx   . Esophageal cancer Neg Hx     Social History   Socioeconomic History  . Marital status: Legally Separated    Spouse name: Not on file  . Number of children: 1  . Years of education: Not on file  . Highest education level: Not on file  Occupational History  . Occupation: Land  Tobacco Use  . Smoking status: Former Smoker    Packs/day: 1.00    Years: 30.00    Pack years: 30.00    Types: Cigarettes    Quit date: 09/16/2018    Years since quitting: 1.8  . Smokeless tobacco: Never Used  Vaping Use  . Vaping Use: Never used  Substance and Sexual Activity  . Alcohol use: Yes    Comment: social  . Drug use: Never  . Sexual activity: Yes  Other Topics Concern  . Not on file  Social History Narrative  . Not on file   Social Determinants of Health   Financial Resource Strain:   . Difficulty of Paying Living Expenses: Not on file  Food Insecurity:   . Worried About Charity fundraiser in the Last Year: Not on file  . Ran Out of Food in the Last Year: Not on file  Transportation Needs:   . Lack of Transportation (Medical): Not on file  . Lack of Transportation (Non-Medical): Not on file  Physical Activity:   . Days of Exercise per Week: Not on file  . Minutes of Exercise per Session: Not on file  Stress:   . Feeling of Stress : Not on file  Social Connections:   . Frequency of Communication with Friends and Family: Not on file  . Frequency of Social Gatherings with Friends and Family: Not on file  . Attends Religious Services: Not on file  . Active Member of Clubs or Organizations: Not on file  . Attends Archivist Meetings: Not on file  . Marital Status: Not on file     Vital Signs: LMP  (LMP Unknown)    Imaging: No results  found.  Labs:  CBC: Recent Labs    07/27/19 1404 08/03/19 1553 08/04/19 0444 06/17/20 1513 06/19/20 0715 07/11/20 1052  WBC 6.1  --   --  5.7 4.6 4.3  HGB 12.3   < > 11.6* 13.7 12.7 13.3  HCT 38.5   < > 36.8 42.7 38.2 39.6  PLT 231  --   --  270 221 254   < > =  values in this interval not displayed.    COAGS: Recent Labs    06/19/20 0715 07/11/20 1054  INR 1.1 1.1    BMP: Recent Labs    08/04/19 0444 08/04/19 0444 10/27/19 1148 04/11/20 1102 06/17/20 1513 07/11/20 1052  NA 136   < > 140 142 139 141  K 4.5   < > 3.8 4.1 4.7 3.8  CL 104   < > 104 104 103 107  CO2 23   < > 27 26 27 28   GLUCOSE 174*   < > 114* 103* 95 111*  BUN 18   < > 16 14 22* 18  CALCIUM 8.8*   < > 9.1 9.7 9.5 9.8  CREATININE 1.22*  --  1.05* 1.21* 0.88 1.14*  GFRNONAA 50*  --  59* 50* >60 54*  GFRAA 58*  --  >60 58* >60  --    < > = values in this interval not displayed.    LIVER FUNCTION TESTS: Recent Labs    06/17/20 1513 07/11/20 1052  BILITOT 0.6 0.6  AST 45* 36  ALT 58* 46*  ALKPHOS 102 118  PROT 8.0 7.6  ALBUMIN 4.1 3.6    Assessment and Plan: 59 year old female with history of rectal cancer diagnosed in 2019 and treated with low anterior resection and adjuvant chemoradiation with subsequent development of segment 6 hepatic metastasis and questionable metastasis in segment 3.  She is now status post microwave ablation of these lesions on 06/19/20, doing well post procedure.  She is very anxious about outcome of the ablation treatment, and expresses fear of waiting until January for follow up imaging.  Plan for MRI abdomen in mid-November with follow up telephone visit.  Electronically Signed: Suzette Battiest 07/17/2020, 10:57 AM   I spent a total of 25 Minutes in telephone clinical consultation, greater than 50% of which was counseling/coordinating care for ablation of hepatic metastases in the setting of rectal cancer.

## 2020-07-18 ENCOUNTER — Ambulatory Visit: Payer: 59 | Admitting: Oncology

## 2020-08-02 ENCOUNTER — Other Ambulatory Visit: Payer: Self-pay | Admitting: Interventional Radiology

## 2020-08-02 DIAGNOSIS — C787 Secondary malignant neoplasm of liver and intrahepatic bile duct: Secondary | ICD-10-CM

## 2020-08-02 DIAGNOSIS — C20 Malignant neoplasm of rectum: Secondary | ICD-10-CM

## 2020-08-13 ENCOUNTER — Ambulatory Visit (HOSPITAL_COMMUNITY)
Admission: RE | Admit: 2020-08-13 | Discharge: 2020-08-13 | Disposition: A | Discharge status: Home / Self Care | Payer: 59 | Source: Ambulatory Visit | Attending: Interventional Radiology | Admitting: Interventional Radiology

## 2020-08-13 ENCOUNTER — Other Ambulatory Visit: Payer: Self-pay

## 2020-08-13 DIAGNOSIS — C787 Secondary malignant neoplasm of liver and intrahepatic bile duct: Secondary | ICD-10-CM | POA: Diagnosis present

## 2020-08-13 DIAGNOSIS — C20 Malignant neoplasm of rectum: Secondary | ICD-10-CM | POA: Diagnosis not present

## 2020-08-13 MED ORDER — GADOBUTROL 1 MMOL/ML IV SOLN: 10.0000 mL | Freq: Once | INTRAVENOUS | Status: DC | PRN

## 2020-08-13 MED ORDER — GADOXETATE DISODIUM 0.25 MMOL/ML IV SOLN
10.0000 mL | Freq: Once | INTRAVENOUS | Status: AC
  Administered 2020-08-13: 10 mL via INTRAVENOUS

## 2020-08-15 ENCOUNTER — Ambulatory Visit
Admission: RE | Admit: 2020-08-15 | Discharge: 2020-08-15 | Disposition: A | Discharge status: Home / Self Care | Payer: 59 | Source: Ambulatory Visit | Attending: Interventional Radiology | Admitting: Interventional Radiology

## 2020-08-15 ENCOUNTER — Telehealth: Payer: Self-pay | Admitting: *Deleted

## 2020-08-15 ENCOUNTER — Other Ambulatory Visit: Payer: Self-pay

## 2020-08-15 ENCOUNTER — Encounter: Payer: Self-pay | Admitting: *Deleted

## 2020-08-15 DIAGNOSIS — C787 Secondary malignant neoplasm of liver and intrahepatic bile duct: Secondary | ICD-10-CM

## 2020-08-15 DIAGNOSIS — C20 Malignant neoplasm of rectum: Secondary | ICD-10-CM

## 2020-08-15 HISTORY — PX: IR RADIOLOGIST EVAL & MGMT: IMG5224

## 2020-08-15 NOTE — Telephone Encounter (Signed)
Called patient to have her come in to see Dr. Benay Spice tomorrow at 1:45. She agrees to appointment.

## 2020-08-15 NOTE — Progress Notes (Signed)
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    Referring Physician(s): Julieanne Manson  History of Present Illness: Kaylee Trivett is a 59 y.o. female who initially presented at the kind request of Dr. 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  Mrs. Curnow was initially diagnosed with a partially obstructing rectal mass in August 2019.  She underwent neoadjuvant combined radiation and chemotherapy (Xeloda) from September through October 2019 followed by a low anterior resection in December 2019.    Subsequently, she underwent 4 cycles of additional adjuvant chemotherapy with CAPOX completed in March 2020.  Subsequent surveillance imaging initially detected no evidence of metastatic disease.  Unfortunately, CT imaging from July 2021 demonstrated a subtle lesion in hepatic segment 6 concerning for metastatic disease.  A follow-up MRI dated 05/08/2020 confirms a 2.1 cm solid lesion in hepatic segment 6 as well as a concerning for millimeter lesion in the posterior aspect of segment 3.  Both lesions are concerning for metastatic disease.  She underwent concurrent ultrasound-guided biopsy and microwave ablation of the 2 liver lesions on 06/19/2020 and presents today for follow-up.  She had an MRI of the abdomen with gadolinium contrast on 08/13/2020.  Unfortunately, while the MRI shows an excellent treatment effect at the sites of the 2 ablated lesions, there are 2 new lesions highly concerning for additional metastatic foci.  Clinically, Mrs. Moyd is doing well and is fully recovered from her procedure.  Past Medical History:  Diagnosis Date  . Adenocarcinoma of rectum [facility]) oncologist-- dr Benay Spice--  per last note in epic ,  clinical remission   dx 08/ 2019---- chemoradiation concurrent completed 07-20-2018;  s/p   low anterior resection 09-16-2018;   chemo 10-17-2018  ot 12-19-2018  . Anxiety   . Arthritis   . Chemotherapy induced neutropenia ([place])   . Depression   . Headache    migraines   . History of HPV infection   . History of kidney stones   . Hydronephrosis, right   . Restless leg syndrome   . Scoliosis   . Sepsis ([place])    11-2018    Past Surgical History:  Procedure Laterality Date  . COLPOSCOPY  04/04/2018  . CYSTOSCOPY WITH RETROGRADE PYELOGRAM, URETEROSCOPY AND STENT PLACEMENT Right 12/12/2018   Procedure: CYSTOSCOPY WITH RETROGRADE PYELOGRAM, URETEROSCOPY, STONE EXTRACTION AND STENT PLACEMENT;  Surgeon: Cleon Gustin, MD;  Location: [facility];  Service: Urology;  Laterality: Right;  . CYSTOSCOPY WITH RETROGRADE PYELOGRAM, URETEROSCOPY AND STENT PLACEMENT Right 04/17/2019   Procedure: CYSTOSCOPY WITH RETROGRADE PYELOGRAM, BALLOON [place]  URETEROSCOPY AND STENT PLACEMENT;  Surgeon: Cleon Gustin, MD;  Location: [facility]  Service: Urology;  Laterality: Right;  . CYSTOSCOPY WITH STENT PLACEMENT Right 11/21/2018   Procedure: CYSTOSCOPY, RIGHT RETROGRADE PYELOGRAM, WITH RIGHT URETERAL STENT PLACEMENT;  Surgeon: Cleon Gustin, MD;  Location: [facility];  Service: Urology;  Laterality: Right;  . IR NEPHROSTOMY PLACEMENT RIGHT  06/22/2019  . IR PATIENT EVAL TECH 0-60 MINS  06/28/2019  . IR RADIOLOGIST EVAL & MGMT  06/06/2020  . IR RADIOLOGIST EVAL & MGMT  07/17/2020  . left forearm surgery due to calcium rock     . RADIOLOGY WITH ANESTHESIA N/A 06/19/2020   Procedure: CT WITH ANESTHESIA MICROWAVE ABLATION;  Surgeon: Jacqulynn Cadet, MD;  Location: [facility];  Service: Anesthesiology;  Laterality: N/A;  .  ROBOT ASSISTED PYELOPLASTY Right 08/03/2019   Procedure: XI ROBOTIC ASSISTED PYELOPLASTY WITH STENT PLACEMENT;  Surgeon: Cleon Gustin, MD;  Location: [facility];  Service: Urology;  Laterality: Right;  3 HRS  . SHOULDER ARTHROSCOPY  W/ ROTATOR CUFF REPAIR Right   . XI ROBOTIC ASSISTED LOWER ANTERIOR RESECTION N/A 09/16/2018   Procedure: XI ROBOTIC ASSISTED LOWER ANTERIOR RESECTION ERAS PATHWAY;  Surgeon: Leighton Ruff, MD;  Location: [facility];  Service: General;  Laterality: N/A;    Allergies: Codeine  Medications: Prior to Admission medications   Medication Sig Start Date End Date Taking? Authorizing Provider  alprazolam Duanne Moron) 2 MG tablet Take 2 mg by mouth every morning.     [provider]  Ascorbic Acid (VITAMIN C PO) Take 1 tablet by mouth every morning. Patient not taking: Reported on 07/11/2020    [provider]  aspirin EC 81 MG tablet Take 81 mg by mouth every morning. Swallow whole. Patient not taking: Reported on 07/11/2020    [provider]  BIOTIN PO Take 1 tablet by mouth every morning. Patient not taking: Reported on 07/11/2020    [provider]  bisacodyl (BISACODYL) 5 MG EC tablet Take 10 mg by mouth in the morning and at bedtime.     [provider]  buPROPion (WELLBUTRIN XL) 300 MG 24 hr tablet Take 300 mg by mouth every morning.     [provider]  Calcium Carbonate-Vitamin D (CALCIUM-D PO) Take 1 tablet by mouth See admin instructions. Take 1 tablet by mouth of either calcium or calcium +D every morning Patient not taking: Reported on 07/11/2020    [provider]  CALCIUM PO Take 1 tablet by mouth See admin instructions. Take 1 tablet by mouth of either calcium or calcium +D every morning Patient not taking: Reported on 07/11/2020    [provider]  Cholecalciferol (VITAMIN D3 PO) Take 1 tablet by mouth every morning. Patient not taking: Reported on 07/11/2020    [provider]  cyclobenzaprine (FLEXERIL) 10 MG tablet Take 10 mg by mouth at bedtime.     [provider]  HYDROcodone-acetaminophen (NORCO) 10-325 MG tablet Take 1-2 tablets by mouth every 6 (six) hours as needed for moderate pain or severe pain  (headache). Post-Operatively. Patient taking differently: Take 1-2 tablets by mouth every 4 (four) hours as needed for moderate pain or severe pain (headache). Post-Operatively. 08/05/19   Alexis Frock, MD  MAGNESIUM CITRATE PO Take 0.5 Bottles by mouth every morning.  Patient not taking: Reported on 07/11/2020    [provider]  MAGNESIUM PO Take 1 tablet by mouth every morning. Patient not taking: Reported on 07/11/2020    [provider]  Multiple Vitamin (MULTIVITAMIN WITH MINERALS) TABS tablet Take 1 tablet by mouth every morning. Patient not taking: Reported on 07/11/2020    [provider]  oxyCODONE (OXY IR/ROXICODONE) 5 MG immediate release tablet Take 1 tablet (5 mg total) by mouth every 6 (six) hours as needed for moderate pain or severe pain. Patient not taking: Reported on 07/11/2020 06/20/20   Ronney Lion, PA-C  Prenatal Vit-Fe Fumarate-FA (PRENATAL VITAMIN PO) Take 1 tablet by mouth every morning. Patient not taking: Reported on 07/11/2020    [provider]  Probiotic Product (PROBIOTIC PO) Take 1 tablet by mouth every morning. Patient not taking: Reported on 07/11/2020    [provider]  psyllium (METAMUCIL) 58.6 % powder Take 1 packet by mouth daily. Patient not taking: Reported on  07/11/2020    [provider]  rOPINIRole (REQUIP) 0.25 MG tablet Take 0.25 mg by mouth at bedtime.    [provider]  Sennosides-Docusate Sodium (SENNA-DOCUSATE SODIUM PO) Take 2 tablets by mouth every morning. Ducolax Brand  Patient not taking: Reported on 07/11/2020    [provider]  Simethicone (GAS-X PO) Take 1 tablet by mouth every morning.  Patient not taking: Reported on 07/11/2020    [provider]     Family History  Problem Relation Age of Onset  . COPD Father        smoker  . Stomach cancer Father        spread to lungs and bones  . Arthritis Mother   . Heart attack Paternal Grandmother   .  Heart attack Paternal Grandfather   . Heart attack Maternal Grandmother   . Stroke Maternal Grandmother   . Colon cancer Neg Hx   . Rectal cancer Neg Hx   . Esophageal cancer Neg Hx     Social History   Socioeconomic History  . Marital status: Legally Separated    Spouse name: Not on file  . Number of children: 1  . Years of education: Not on file  . Highest education level: Not on file  Occupational History  . Occupation: Land  Tobacco Use  . Smoking status: Former Smoker    Packs/day: 1.00    Years: 30.00    Pack years: 30.00    Types: Cigarettes    Quit date: 09/16/2018    Years since quitting: 1.9  . Smokeless tobacco: Never Used  Vaping Use  . Vaping Use: Never used  Substance and Sexual Activity  . Alcohol use: Yes    Comment: social  . Drug use: Never  . Sexual activity: Yes  Other Topics Concern  . Not on file  Social History Narrative  . Not on file   Social Determinants of Health   Financial Resource Strain:   . Difficulty of Paying Living Expenses: Not on file  Food Insecurity:   . Worried About Charity fundraiser in the Last Year: Not on file  . Ran Out of Food in the Last Year: Not on file  Transportation Needs:   . Lack of Transportation (Medical): Not on file  . Lack of Transportation (Non-Medical): Not on file  Physical Activity:   . Days of Exercise per Week: Not on file  . Minutes of Exercise per Session: Not on file  Stress:   . Feeling of Stress : Not on file  Social Connections:   . Frequency of Communication with Friends and Family: Not on file  . Frequency of Social Gatherings with Friends and Family: Not on file  . Attends Religious Services: Not on file  . Active Member of Clubs or Organizations: Not on file  . Attends Archivist Meetings: Not on file  . Marital Status: Not on file    ECOG Status: 0 - Asymptomatic  Review of Systems  Review of Systems: A 12 point ROS discussed and pertinent positives are  indicated in the HPI above.  All other systems are negative.  Physical Exam No direct physical exam was performed (except for noted visual exam findings with Video Visits).   Vital Signs: LMP  (LMP Unknown)   Imaging: MR ABDOMEN WWO CONTRAST  Result Date: 08/14/2020 CLINICAL DATA:  Rectal cancer.  Liver metastases. EXAM: MRI ABDOMEN WITHOUT AND WITH CONTRAST TECHNIQUE: Multiplanar multisequence MR imaging of the  abdomen was performed both before and after the administration of intravenous contrast. CONTRAST:  40mL EOVIST GADOXETATE DISODIUM 0.25 MOL/L IV SOLN COMPARISON:  05/08/2020 FINDINGS: Lower chest: Unremarkable. Hepatobiliary: Lesion identified in segment VI previously now replaced by ablation defect. No substantial restricted diffusion at the treatment site although there is some altered perfusion in this region, not unexpected. No definite suspicious enhancement. Lesion described previously in the posterior aspect of segment III has been replaced by a focus of T1 shortening on precontrast imaging, likely related to cysts granulation/scarring from ablation. No overtly suspicious enhancement in this region on postcontrast imaging although assessment is limited by motion artifact. A tiny focus of restricted diffusion is identified in segment IV anteriorly (see diffusion image 50 of series 20). A 7 mm focus of flash enhancement is identified at this location on postcontrast imaging (image 47/series 9). This appears similar to the previous study. 6 mm focus of arterial phase hyperenhancement is identified in the inferior right liver (segment V), visible on 61/9. This lesion also restricts diffusion and appears to be new in the interval since the prior study. Gallbladder is contracted with multiple stones evident. No intrahepatic or extrahepatic biliary dilation. Pancreas: No focal mass lesion. No dilatation of the main duct. No intraparenchymal cyst. No peripancreatic edema. Spleen:  No splenomegaly.  No focal mass lesion. Adrenals/Urinary Tract: No adrenal nodule or mass. Kidneys unremarkable. Stomach/Bowel: Stomach is unremarkable. No gastric wall thickening. No evidence of outlet obstruction. Duodenum is normally positioned as is the ligament of Treitz. No small bowel or colonic dilatation within the visualized abdomen. Vascular/Lymphatic: No abdominal aortic aneurysm. No abdominal lymphadenopathy Other:  No intraperitoneal free fluid. Musculoskeletal: No focal suspicious marrow enhancement within the visualized bony anatomy. IMPRESSION: 1. Interval ablation of lesions in segment VI and posterior segment III without definite findings to suggest residual disease/local recurrence. 2. There are 2 tiny foci of restricted diffusion in the liver on today's exam. Both show arterial phase hyperenhancement. One of these is near the junction of segment IV A and IV B, not substantially changed in the interval. The other is in the inferior right liver (segment V) and is new since the prior study. Metastatic disease a concern. Close attention on follow-up recommended. 3. Cholelithiasis. Electronically Signed   By: Misty Stanley M.D.   On: 08/14/2020 11:04   IR Radiologist Eval & Mgmt  Result Date: 07/17/2020 Please refer to notes tab for details about interventional procedure. (Op Note)   Labs:  CBC: Recent Labs    06/17/20 1513 06/19/20 0715 07/11/20 1052  WBC 5.7 4.6 4.3  HGB 13.7 12.7 13.3  HCT 42.7 38.2 39.6  PLT 270 221 254    COAGS: Recent Labs    06/19/20 0715 07/11/20 1054  INR 1.1 1.1    BMP: Recent Labs    10/27/19 1148 04/11/20 1102 06/17/20 1513 07/11/20 1052  NA 140 142 139 141  K 3.8 4.1 4.7 3.8  CL 104 104 103 107  CO2 27 26 27 28   GLUCOSE 114* 103* 95 111*  BUN 16 14 22* 18  CALCIUM 9.1 9.7 9.5 9.8  CREATININE 1.05* 1.21* 0.88 1.14*  GFRNONAA 59* 50* >60 54*  GFRAA >60 58* >60  --     LIVER FUNCTION TESTS: Recent Labs    06/17/20 1513 07/11/20 1052    BILITOT 0.6 0.6  AST 45* 36  ALT 58* 46*  ALKPHOS 102 118  PROT 8.0 7.6  ALBUMIN 4.1 3.6    TUMOR MARKERS:  No results for input(s): AFPTM, CEA, CA199, CHROMGRNA in the last 8760 hours.  Assessment and Plan:  Clinically, Mrs. 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  2 individual lesions were targeted and treated.  Follow-up MRI demonstrates excellent treatment effect with no evidence of residual disease at the 2 ablation sites.  Unfortunately, there are 2 new avidly enhancing lesions which were not previously present and which are concerning for new foci of metastatic disease.  Given the relatively rapid appearance of these lesions, there is certainly a possibility that there are additional metastatic lesions which are too small to be visualized on imaging.  Currently, options include pursuing additional targeted liver directed therapy with microwave ablation of the 2 new lesions.  Alternately, it may be time to consider systemic therapy given the relatively rapid appearance of these 2 new lesions.  I discussed these findings with her medical oncologist, Dr. Benay Spice, who agrees that further liver directed therapy is unlikely to be curative at this point.  He will plan on seeing Ms. Zeitlin as soon as possible to discuss the best plan moving forward.  Also unfortunately, Mrs. Stallone is feeling very frustrated by her disease process, and also her care.  She is interested in receiving a second opinion at [facility] and has asked for all of her records to be faxed there.  I will have my office accommodate her request.        Electronically Signed: Criselda Peaches 08/15/2020, 3:10 PM   I spent a total of  25 Minutes in remote  clinical consultation, greater than 50% of which was counseling/coordinating care for colon cancer metastatic to the liver.  Visit type: Audio only (telephone).  Audio (no video) only due to patient preference.. Alternative for in-person consultation at [facility], [address], [place], [place]. This visit type was conducted due to national recommendations for restrictions regarding the COVID-19 Pandemic (e.g. social distancing).  This format is felt to be most appropriate for this patient at this time.  All issues noted in this document were discussed and addressed.

## 2020-08-16 ENCOUNTER — Other Ambulatory Visit: Payer: Self-pay

## 2020-08-16 ENCOUNTER — Inpatient Hospital Stay: Payer: 59 | Attending: Oncology | Admitting: Oncology

## 2020-08-16 ENCOUNTER — Telehealth: Payer: Self-pay | Admitting: Oncology

## 2020-08-16 VITALS — BP 134/81 | HR 101 | Temp 98.3°F | Resp 17 | Ht 68.0 in | Wt 225.8 lb

## 2020-08-16 DIAGNOSIS — Z87442 Personal history of urinary calculi: Secondary | ICD-10-CM | POA: Insufficient documentation

## 2020-08-16 DIAGNOSIS — N132 Hydronephrosis with renal and ureteral calculous obstruction: Secondary | ICD-10-CM | POA: Insufficient documentation

## 2020-08-16 DIAGNOSIS — G2581 Restless legs syndrome: Secondary | ICD-10-CM | POA: Insufficient documentation

## 2020-08-16 DIAGNOSIS — E042 Nontoxic multinodular goiter: Secondary | ICD-10-CM | POA: Diagnosis not present

## 2020-08-16 DIAGNOSIS — C20 Malignant neoplasm of rectum: Secondary | ICD-10-CM | POA: Insufficient documentation

## 2020-08-16 DIAGNOSIS — G43909 Migraine, unspecified, not intractable, without status migrainosus: Secondary | ICD-10-CM | POA: Diagnosis not present

## 2020-08-16 DIAGNOSIS — K5900 Constipation, unspecified: Secondary | ICD-10-CM | POA: Insufficient documentation

## 2020-08-16 DIAGNOSIS — Z87891 Personal history of nicotine dependence: Secondary | ICD-10-CM | POA: Diagnosis not present

## 2020-08-16 DIAGNOSIS — T451X5A Adverse effect of antineoplastic and immunosuppressive drugs, initial encounter: Secondary | ICD-10-CM | POA: Diagnosis not present

## 2020-08-16 DIAGNOSIS — K769 Liver disease, unspecified: Secondary | ICD-10-CM | POA: Insufficient documentation

## 2020-08-16 DIAGNOSIS — R200 Anesthesia of skin: Secondary | ICD-10-CM | POA: Diagnosis not present

## 2020-08-16 DIAGNOSIS — G620 Drug-induced polyneuropathy: Secondary | ICD-10-CM | POA: Diagnosis not present

## 2020-08-16 DIAGNOSIS — R109 Unspecified abdominal pain: Secondary | ICD-10-CM | POA: Insufficient documentation

## 2020-08-16 NOTE — Telephone Encounter (Signed)
Scheduled per 11/12 los. Printed avs and calendar for pt.  

## 2020-08-16 NOTE — Progress Notes (Signed)
[place] OFFICE PROGRESS NOTE   Diagnosis: Rectal cancer  INTERVAL HISTORY:   Norma Colon returns prior to scheduled visit.  She continues to have pain at the right flank.  She has persistent constipation.  She otherwise feels well. She saw Norma Colon yesterday.  A restaging liver MRI on 08/13/2020 revealed a good result at the ablation sites, but there are 2 new lesions concerning for metastases. She discussed treatment options with Norma Colon and is referred for oncology discussion.   Objective:  Vital signs in last 24 hours:  Blood pressure 134/81, pulse (!) 101, temperature 98.3 F (36.8 C), temperature source Tympanic, resp. rate 17, height 5' 8"  (1.727 m), weight 225 lb 12.8 oz (102.4 kg), SpO2 98 %.    Lymphatics: No cervical, supraclavicular, axillary, or inguinal nodes Resp: Lungs clear bilaterally Cardio: Regular rate and rhythm GI: No hepatosplenomegaly Vascular: No leg edema    Lab Results:  Lab Results  Component Value Date   WBC 4.3 07/11/2020   HGB 13.3 07/11/2020   HCT 39.6 07/11/2020   MCV 93.6 07/11/2020   PLT 254 07/11/2020   NEUTROABS 2.7 07/11/2020    CMP  Lab Results  Component Value Date   NA 141 07/11/2020   K 3.8 07/11/2020   CL 107 07/11/2020   CO2 28 07/11/2020   GLUCOSE 111 (H) 07/11/2020   BUN 18 07/11/2020   CREATININE 1.14 (H) 07/11/2020   CALCIUM 9.8 07/11/2020   PROT 7.6 07/11/2020   ALBUMIN 3.6 07/11/2020   AST 36 07/11/2020   ALT 46 (H) 07/11/2020   ALKPHOS 118 07/11/2020   BILITOT 0.6 07/11/2020   GFRNONAA 54 (L) 07/11/2020   GFRAA >60 06/17/2020    Lab Results  Component Value Date   CEA1 2.84 07/11/2020     Imaging:  MR ABDOMEN WWO CONTRAST  Result Date: 08/14/2020 CLINICAL DATA:  Rectal cancer.  Liver metastases. EXAM: MRI ABDOMEN WITHOUT AND WITH CONTRAST TECHNIQUE: Multiplanar multisequence MR imaging of the abdomen was performed both before and after the administration of  intravenous contrast. CONTRAST:  97m EOVIST GADOXETATE DISODIUM 0.25 MOL/L IV SOLN COMPARISON:  05/08/2020 FINDINGS: Lower chest: Unremarkable. Hepatobiliary: Lesion identified in segment VI previously now replaced by ablation defect. No substantial restricted diffusion at the treatment site although there is some altered perfusion in this region, not unexpected. No definite suspicious enhancement. Lesion described previously in the posterior aspect of segment III has been replaced by a focus of T1 shortening on precontrast imaging, likely related to cysts granulation/scarring from ablation. No overtly suspicious enhancement in this region on postcontrast imaging although assessment is limited by motion artifact. A tiny focus of restricted diffusion is identified in segment IV anteriorly (see diffusion image 50 of series 20). A 7 mm focus of flash enhancement is identified at this location on postcontrast imaging (image 47/series 9). This appears similar to the previous study. 6 mm focus of arterial phase hyperenhancement is identified in the inferior right liver (segment V), visible on 61/9. This lesion also restricts diffusion and appears to be new in the interval since the prior study. Gallbladder is contracted with multiple stones evident. No intrahepatic or extrahepatic biliary dilation. Pancreas: No focal mass lesion. No dilatation of the main duct. No intraparenchymal cyst. No peripancreatic edema. Spleen:  No splenomegaly. No focal mass lesion. Adrenals/Urinary Tract: No adrenal nodule or mass. Kidneys unremarkable. Stomach/Bowel: Stomach is unremarkable. No gastric wall thickening. No evidence of outlet obstruction. Duodenum is normally positioned as is  the ligament of Treitz. No small bowel or colonic dilatation within the visualized abdomen. Vascular/Lymphatic: No abdominal aortic aneurysm. No abdominal lymphadenopathy Other:  No intraperitoneal free fluid. Musculoskeletal: No focal suspicious marrow  enhancement within the visualized bony anatomy. IMPRESSION: 1. Interval ablation of lesions in segment VI and posterior segment III without definite findings to suggest residual disease/local recurrence. 2. There are 2 tiny foci of restricted diffusion in the liver on today's exam. Both show arterial phase hyperenhancement. One of these is near the junction of segment IV A and IV B, not substantially changed in the interval. The other is in the inferior right liver (segment V) and is new since the prior study. Metastatic disease a concern. Close attention on follow-up recommended. 3. Cholelithiasis. Electronically Signed   By: Norma Stanley M.D.   On: 08/14/2020 11:04   IR Radiologist Eval & Mgmt  Result Date: 08/15/2020 Please refer to notes tab for details about interventional procedure. (Op Note)   Medications: I have reviewed the patient's current medications.   Assessment/Plan: 1.  Rectal cancer ? 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 CTs 05/24/2018-mass beginning at proximal he 7.6 cm from the anal verge, "20 "perirectal lymph nodes, no evidence of metastatic disease ? Pelvic MRI 06/01/2018-tumor measured at 10.8 cm from the anal sphincter, T3b, N1-2 left perirectal lymph nodes, each measuring 5 mm ? Radiation and Xeloda initiated 06/13/2018, completed 07/20/2018 ? Low anterior resection 09/16/2018,ypT3,ypN1c, 1 cm tumor, partial response-score 2, 0/13 lymph nodes positive, 2 tumor deposits, MSI-stable, no loss of mismatch repair protein expression ? Cycle 1 CAPOX 10/17/2018 ? Cycle 2 CAPOX 11/07/2018 (oxaliplatin dose reduced due to neutropenia) ? Cycle 3 CAPOX 11/28/2018 ? Cycle 4 CAPOX 12/19/2018 ? CTs 05/29/2019-no evidence of metastatic disease, severe right hydronephrosis, left thyroid nodule ? CTs 04/11/2020-subtle nodular lesion in the inferior right liver  suspicious for a new metastasis, status post right ureteral resection and psoas hitch/flap, resolution of hydronephrosis, sacral insufficiency fractures bilaterally ? MRI liver 05/08/2020-2.1 cm segment 6 lesion suspicious for a metastasis, 4 mm suspicious segment 3 lesion 4 mm segment 4B lesion consistent with a hemangioma, no abdominal lymphadenopathy, cholelithiasis ? Ablation of segment 6 and segment 3 lesions 06/19/2020, pathology from segment 6 biopsy confirmed metastatic adenocarcinoma ? MRI abdomen 08/13/2020-ablation defects in segment 6 and segment 3, tiny foci of restricted diffusion with arterial phase enhancement at the junctions of segment 4A and segment 4B, and segment 5 concerning for metastatic disease 2. History of Tobacco use 3. 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 Alyson Ingles, urology 4. Migraine headaches 5. Restless legs 6. Arm pain during the oxaliplatin infusion and for 1 week after cycle 1.  The oxaliplatin will be further diluted and infusion time extended getting with cycle 2. 7. Neutropenia secondary to chemotherapy.  Oxaliplatin dose reduced beginning with cycle 2. 8. 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 Right hydronephrosis on CT 05/29/2019-referred to Dr. Alyson Ingles, surgical reimplantation of the right ureter on 08/03/2019, right ureter stent removed 09/14/2019 11. Thyroid nodules-new versus enlarged left thyroid nodule CT 05/29/2019, thyroid ultrasound 06/20/2019-multiple nodules, 1 right-sided nodule met criteria for ultrasound follow-up  Ultrasound  05/08/2020-multinodular goiter, stable nodules in the isthmus and right thyroid meeting criteria for 1 year follow-up,  additional stable nodules do not meet criteria for follow-up     Disposition: Ms. Profeta appears unchanged.  I reviewed the MRI findings with her.  She has been diagnosed with metastatic rectal cancer.  She underwent ablation of 2 liver lesions, but appears to have additional foci of metastatic disease involving the liver.  She understands it is unlikely ablation of the 2 "new "lesions will be curative.  We discussed treatment options including observation, systemic therapy, and additional hepatic directed therapy.  She plans to seek a second opinion in medical oncology at [facility].  I recommend a restaging CT or PET evaluation prior to treatment planning.  Ms. Acocella will return for an office visit in 2-3 weeks.  Norma Coder, MD  08/16/2020  2:35 PM

## 2020-08-21 ENCOUNTER — Telehealth: Payer: Self-pay | Admitting: *Deleted

## 2020-08-21 NOTE — Telephone Encounter (Signed)
Saw Dr. Lanetta Inch at [facility] and is calling requesting to have port placed asap and see Dr. Benay Spice and get started on treatment. She agrees to allow IR to place her port to get it done sooner. Informed her that MD will be made aware tomorrow-not in office now.

## 2020-08-22 ENCOUNTER — Other Ambulatory Visit: Payer: Self-pay | Admitting: Interventional Radiology

## 2020-08-22 DIAGNOSIS — C20 Malignant neoplasm of rectum: Secondary | ICD-10-CM

## 2020-08-22 DIAGNOSIS — C787 Secondary malignant neoplasm of liver and intrahepatic bile duct: Secondary | ICD-10-CM

## 2020-08-23 ENCOUNTER — Telehealth: Payer: Self-pay | Admitting: *Deleted

## 2020-08-23 ENCOUNTER — Other Ambulatory Visit: Payer: Self-pay | Admitting: Oncology

## 2020-08-23 DIAGNOSIS — C20 Malignant neoplasm of rectum: Secondary | ICD-10-CM

## 2020-08-23 NOTE — Telephone Encounter (Signed)
Dr. Benay Spice received office note from Dr. Dorene Grebe at [facility] and has placed order for [facility] per IR at patient's requests. OV on 11/22 at 2:45 to go over plans. Called patient and she agrees. Left message for IR to start working on port placement on Monday if possible.

## 2020-08-26 ENCOUNTER — Encounter: Payer: Self-pay | Admitting: Nurse Practitioner

## 2020-08-26 ENCOUNTER — Other Ambulatory Visit: Payer: Self-pay

## 2020-08-26 ENCOUNTER — Inpatient Hospital Stay (HOSPITAL_BASED_OUTPATIENT_CLINIC_OR_DEPARTMENT_OTHER): Payer: 59 | Admitting: Nurse Practitioner

## 2020-08-26 VITALS — BP 135/79 | HR 105 | Temp 99.0°F | Resp 18 | Ht 68.0 in | Wt 223.2 lb

## 2020-08-26 DIAGNOSIS — C20 Malignant neoplasm of rectum: Secondary | ICD-10-CM | POA: Diagnosis not present

## 2020-08-26 NOTE — Progress Notes (Addendum)
[place] OFFICE PROGRESS NOTE   Diagnosis: Rectal cancer  INTERVAL HISTORY:   Norma Colon returns for follow-up to discuss initiation of systemic therapy.  She feels well.  She reports she has no symptoms of having cancer.  Bowels continue to be erratic.  Objective:  Vital signs in last 24 hours:  Blood pressure 135/79, pulse (!) 105, temperature 99 F (37.2 C), temperature source Tympanic, resp. rate 18, height 5' 8" (1.727 m), weight 223 lb 3.2 oz (101.2 kg), SpO2 98 %.    Resp: Lungs clear bilaterally. Cardio: Regular rate and rhythm. GI: Abdomen soft and nontender.  No hepatomegaly. Vascular: No leg edema.   Lab Results:  Lab Results  Component Value Date   WBC 4.3 07/11/2020   HGB 13.3 07/11/2020   HCT 39.6 07/11/2020   MCV 93.6 07/11/2020   PLT 254 07/11/2020   NEUTROABS 2.7 07/11/2020    Imaging:  No results found.  Medications: I have reviewed the patient's current medications.  Assessment/Plan: 1.  Rectal cancer ? 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 CTs 05/24/2018-mass beginning at proximal he 7.6 cm from the anal verge, "20 "perirectal lymph nodes, no evidence of metastatic disease ? Pelvic MRI 06/01/2018-tumor measured at 10.8 cm from the anal sphincter, T3b, N1-2 left perirectal lymph nodes, each measuring 5 mm ? Radiation and Xeloda initiated 06/13/2018, completed 07/20/2018 ? Low anterior resection 09/16/2018,ypT3,ypN1c, 1 cm tumor, partial response-score 2, 0/13 lymph nodes positive, 2 tumor deposits, MSI-stable, no loss of mismatch repair protein expression ? Cycle 1 CAPOX 10/17/2018 ? Cycle 2 CAPOX 11/07/2018 (oxaliplatin dose reduced due to neutropenia) ? Cycle 3 CAPOX 11/28/2018 ? Cycle 4 CAPOX 12/19/2018 ? CTs 05/29/2019-no evidence of metastatic disease, severe right hydronephrosis, left thyroid  nodule ? CTs 04/11/2020-subtle nodular lesion in the inferior right liver suspicious for a new metastasis, status post right ureteral resection and psoas hitch/flap, resolution of hydronephrosis, sacral insufficiency fractures bilaterally ? MRI liver 05/08/2020-2.1 cm segment 6 lesion suspicious for a metastasis, 4 mm suspicious segment 3 lesion 4 mm segment 4B lesion consistent with a hemangioma, no abdominal lymphadenopathy, cholelithiasis ? Ablation of segment 6 and segment 3 lesions 06/19/2020, pathology from segment 6 biopsy confirmed metastatic adenocarcinoma ? MRI abdomen 08/13/2020-ablation defects in segment 6 and segment 3, tiny foci of restricted diffusion with arterial phase enhancement at the junctions of segment 4A and segment 4B, and segment 5 concerning for metastatic disease 2. History of Tobacco use 3. 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 Alyson Ingles, urology 4. Migraine headaches 5. Restless legs 6. Arm pain during the oxaliplatin infusion and for 1 week after cycle 1. The oxaliplatin will be further diluted and infusion time extended getting with cycle 2. 7. Neutropenia secondary to chemotherapy. Oxaliplatin dose reduced beginning with cycle 2. 8. 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 Right hydronephrosis on CT 05/29/2019-referred to Dr. Alyson Ingles, surgical reimplantation of the right ureter on 08/03/2019, right ureter stent removed 09/14/2019 11. Thyroid nodules-new versus enlarged left thyroid nodule CT 05/29/2019, thyroid ultrasound 06/20/2019-multiple nodules, 1 right-sided nodule met criteria for ultrasound follow-up  Ultrasound 05/08/2020-multinodular goiter, stable nodules in  the isthmus  and right thyroid meeting criteria for 1 year follow-up, additional stable nodules do not meet criteria for follow-up   Disposition: Norma Colon appears stable.  Dr. Benay Spice again discussed treatment options with her including hepatic directed therapy versus initiation of systemic therapy.  She would like to proceed with ablation and reports having a follow-up appointment with Dr. Laurence Ferrari next week to discuss this further.  We are referring her for restaging CT scans.  She is leaving to go out of town on 08/28/2020, returning 09/02/2020.  We will try to get the CT scans scheduled after she returns and see her in follow-up on 09/06/2020.    Patient seen with Dr. Benay Spice.    Tieisha Darden ANP/GNP-BC   08/26/2020  4:31 PM This was a shared visit with Ned Card.  Norma Colon has metastatic rectal cancer.  She was seen by Dr. Dorene Grebe last week.  I reviewed his note.  I agree with the recommendation to proceed with systemic therapy.  Norma Colon would like to consider ablation of the 2 visible liver lesions on the MRI earlier this month.  She plans to meet with Dr. Laurence Ferrari on 09/03/2020.  I recommend FOLFIRI chemotherapy with the addition of Avastin or Panitumumab based on results from NGS testing.  Norma Colon would like to undergo restaging CTs and follow-up for further discussion.  Julieanne Manson, MD

## 2020-08-27 ENCOUNTER — Encounter: Payer: Self-pay | Admitting: Oncology

## 2020-08-27 ENCOUNTER — Encounter: Payer: Self-pay | Admitting: *Deleted

## 2020-08-27 ENCOUNTER — Telehealth: Payer: Self-pay | Admitting: *Deleted

## 2020-08-27 NOTE — Telephone Encounter (Signed)
Called Norma Colon in IR and had port placement of 11/24 moved to 09/04/20 at 2:30 pm for 1 pm arrival time in radiology at [facility]. NPO after 0800 and she needs driver. Called patient and made her aware of appointment and she agreed.

## 2020-08-27 NOTE — Progress Notes (Signed)
Email to [facility] requesting [facility] testing on liver biopsy case #[id number] dated 08/13/20. Dx: C20 Stage IV

## 2020-08-28 ENCOUNTER — Other Ambulatory Visit (HOSPITAL_COMMUNITY): Payer: 59

## 2020-08-28 ENCOUNTER — Ambulatory Visit (HOSPITAL_COMMUNITY): Payer: 59

## 2020-09-02 ENCOUNTER — Other Ambulatory Visit: Payer: Self-pay | Admitting: Radiology

## 2020-09-03 ENCOUNTER — Other Ambulatory Visit: Payer: Self-pay

## 2020-09-03 ENCOUNTER — Ambulatory Visit
Admission: RE | Admit: 2020-09-03 | Discharge: 2020-09-03 | Disposition: A | Discharge status: Home / Self Care | Payer: Medicare Other | Source: Ambulatory Visit | Attending: Interventional Radiology | Admitting: Interventional Radiology

## 2020-09-03 ENCOUNTER — Encounter: Payer: Self-pay | Admitting: *Deleted

## 2020-09-03 ENCOUNTER — Ambulatory Visit: Payer: 59 | Admitting: Oncology

## 2020-09-03 DIAGNOSIS — C20 Malignant neoplasm of rectum: Secondary | ICD-10-CM

## 2020-09-03 HISTORY — PX: IR RADIOLOGIST EVAL & MGMT: IMG5224

## 2020-09-03 NOTE — Progress Notes (Signed)
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    Referring Physician(s): Dr. Dominica Severin B. Sherrill  History of Present Illness: Norma Colon is a 59 y.o. female who initially presented at the kind request of Dr. 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 Norma Colon was initially diagnosed with a partially obstructing rectal mass in August 2019. She underwent neoadjuvant combined radiation and chemotherapy (Xeloda) from September through October 2019 followed by a low anterior resection in December 2019.   Subsequently, she underwent 4 cycles of additional adjuvant chemotherapy with CAPOX completed in March 2020. Subsequent surveillance imaging initially detected no evidence of metastatic disease. Unfortunately, CT imaging from July 2021 demonstrated a subtle lesion in hepatic segment 6 concerning for metastatic disease. A follow-up MRI dated 05/08/2020 confirms a 2.1 cm solid lesion in hepatic segment 6 as well as a concerning for millimeter lesion in the posterior aspect of segment 3. Both lesions are concerning for metastatic disease.  She underwent concurrent ultrasound-guided biopsy and microwave ablation of the 2 liver lesions on 06/19/2020 and presents today for follow-up.  She had an MRI of the abdomen with gadolinium contrast on 08/13/2020.  Unfortunately, while the MRI shows an excellent treatment effect at the sites of the 2 ablated lesions, there are 2 new lesions highly concerning for additional metastatic foci.  Clinically, Norma Colon is doing well and is fully recovered from her procedure.    Past Medical History:  Diagnosis Date  . Adenocarcinoma of rectum [facility]) oncologist-- dr Benay Spice--  per last note in epic ,  clinical remission   dx 08/ 2019---- chemoradiation concurrent completed  07-20-2018;  s/p  low anterior resection 09-16-2018;   chemo 10-17-2018  ot 12-19-2018  . Anxiety   . Arthritis   . Chemotherapy induced neutropenia ([place])   . Depression   . Headache    migraines   . History of HPV infection   . History of kidney stones   . Hydronephrosis, right   . Restless leg syndrome   . Scoliosis   . Sepsis ([place])    11-2018    Past Surgical History:  Procedure Laterality Date  . COLPOSCOPY  04/04/2018  . CYSTOSCOPY WITH RETROGRADE PYELOGRAM, URETEROSCOPY AND STENT PLACEMENT Right 12/12/2018   Procedure: CYSTOSCOPY WITH RETROGRADE PYELOGRAM, URETEROSCOPY, STONE EXTRACTION AND STENT PLACEMENT;  Surgeon: Cleon Gustin, MD;  Location: [facility];  Service: Urology;  Laterality: Right;  . CYSTOSCOPY WITH RETROGRADE PYELOGRAM, URETEROSCOPY AND STENT PLACEMENT Right 04/17/2019   Procedure: CYSTOSCOPY WITH RETROGRADE PYELOGRAM, BALLOON [facility]  URETEROSCOPY AND STENT PLACEMENT;  Surgeon: Cleon Gustin, MD;  Location: [facility];  Service: Urology;  Laterality: Right;  . CYSTOSCOPY WITH STENT PLACEMENT Right 11/21/2018   Procedure: CYSTOSCOPY, RIGHT RETROGRADE PYELOGRAM, WITH RIGHT URETERAL STENT PLACEMENT;  Surgeon: Cleon Gustin, MD;  Location: [facility];  Service: Urology;  Laterality: Right;  . IR NEPHROSTOMY PLACEMENT RIGHT  06/22/2019  . IR PATIENT EVAL TECH 0-60 MINS  06/28/2019  . IR RADIOLOGIST EVAL & MGMT  06/06/2020  . IR RADIOLOGIST EVAL & MGMT  07/17/2020  . IR RADIOLOGIST EVAL & MGMT  08/15/2020  . left forearm surgery due to calcium rock     . RADIOLOGY WITH ANESTHESIA N/A 06/19/2020   Procedure: CT WITH ANESTHESIA MICROWAVE ABLATION;  Surgeon: Jacqulynn Cadet, MD;  Location: [facility];  Service:  Anesthesiology;  Laterality: N/A;  . ROBOT ASSISTED PYELOPLASTY Right 08/03/2019   Procedure: XI ROBOTIC ASSISTED PYELOPLASTY WITH STENT PLACEMENT;  Surgeon: Cleon Gustin, MD;  Location: [facility];  Service:  Urology;  Laterality: Right;  3 HRS  . SHOULDER ARTHROSCOPY W/ ROTATOR CUFF REPAIR Right   . XI ROBOTIC ASSISTED LOWER ANTERIOR RESECTION N/A 09/16/2018   Procedure: XI ROBOTIC ASSISTED LOWER ANTERIOR RESECTION ERAS PATHWAY;  Surgeon: Leighton Ruff, MD;  Location: [facility];  Service: General;  Laterality: N/A;    Allergies: Codeine  Medications: Prior to Admission medications   Medication Sig Start Date End Date Taking? Authorizing Provider  alprazolam Duanne Moron) 2 MG tablet Take 2 mg by mouth every morning.     [provider]  Ascorbic Acid (VITAMIN C PO) Take 1 tablet by mouth every morning.     [provider]  aspirin EC 81 MG tablet Take 81 mg by mouth every morning. Swallow whole.     [provider]  BIOTIN PO Take 1 tablet by mouth every morning.     [provider]  bisacodyl (BISACODYL) 5 MG EC tablet Take 10 mg by mouth in the morning and at bedtime.     [provider]  buPROPion (WELLBUTRIN XL) 300 MG 24 hr tablet Take 300 mg by mouth every morning.     [provider]  Calcium Carbonate-Vitamin D (CALCIUM-D PO) Take 1 tablet by mouth See admin instructions. Take 1 tablet by mouth of either calcium or calcium +D every morning     [provider]  CALCIUM PO Take 1 tablet by mouth See admin instructions. Take 1 tablet by mouth of either calcium or calcium +D every morning     [provider]  Cholecalciferol (VITAMIN D3 PO) Take 1 tablet by mouth every morning.     [provider]  cyclobenzaprine (FLEXERIL) 10 MG tablet Take 10 mg by mouth at bedtime.     [provider]  HYDROcodone-acetaminophen (NORCO) 10-325 MG tablet Take 1-2 tablets by mouth every 6 (six) hours as needed for moderate pain or severe pain (headache). Post-Operatively. Patient taking differently: Take 1-2 tablets by mouth every 4 (four) hours as needed for moderate pain or severe pain (headache). Post-Operatively. 08/05/19    Alexis Frock, MD  MAGNESIUM CITRATE PO Take 0.5 Bottles by mouth every morning.     [provider]  MAGNESIUM PO Take 1 tablet by mouth every morning.     [provider]  Multiple Vitamin (MULTIVITAMIN WITH MINERALS) TABS tablet Take 1 tablet by mouth every morning.     [provider]  Prenatal Vit-Fe Fumarate-FA (PRENATAL VITAMIN PO) Take 1 tablet by mouth every morning.     [provider]  Probiotic Product (PROBIOTIC PO) Take 1 tablet by mouth every morning.     [provider]  psyllium (METAMUCIL) 58.6 % powder Take 1 packet by mouth daily.     [provider]  rOPINIRole (REQUIP) 0.25 MG tablet Take 0.25 mg by mouth at bedtime.    [provider]  Sennosides-Docusate Sodium (SENNA-DOCUSATE SODIUM PO) Take 2 tablets by mouth every morning. Ducolax Brand     [provider]  Simethicone (GAS-X PO) Take 1 tablet by mouth every morning.     [provider]     Family History  Problem Relation Age of Onset  . COPD Father        smoker  . Stomach cancer Father  spread to lungs and bones  . Arthritis Mother   . Heart attack Paternal Grandmother   . Heart attack Paternal Grandfather   . Heart attack Maternal Grandmother   . Stroke Maternal Grandmother   . Colon cancer Neg Hx   . Rectal cancer Neg Hx   . Esophageal cancer Neg Hx     Social History   Socioeconomic History  . Marital status: Legally Separated    Spouse name: Not on file  . Number of children: 1  . Years of education: Not on file  . Highest education level: Not on file  Occupational History  . Occupation: Land  Tobacco Use  . Smoking status: Former Smoker    Packs/day: 1.00    Years: 30.00    Pack years: 30.00    Types: Cigarettes    Quit date: 09/16/2018    Years since quitting: 1.9  . Smokeless tobacco: Never Used  Vaping Use  . Vaping Use: Never used  Substance and Sexual Activity  . Alcohol use: Yes     Comment: social  . Drug use: Never  . Sexual activity: Yes  Other Topics Concern  . Not on file  Social History Narrative  . Not on file   Social Determinants of Health   Financial Resource Strain:   . Difficulty of Paying Living Expenses: Not on file  Food Insecurity:   . Worried About Charity fundraiser in the Last Year: Not on file  . Ran Out of Food in the Last Year: Not on file  Transportation Needs:   . Lack of Transportation (Medical): Not on file  . Lack of Transportation (Non-Medical): Not on file  Physical Activity:   . Days of Exercise per Week: Not on file  . Minutes of Exercise per Session: Not on file  Stress:   . Feeling of Stress : Not on file  Social Connections:   . Frequency of Communication with Friends and Family: Not on file  . Frequency of Social Gatherings with Friends and Family: Not on file  . Attends Religious Services: Not on file  . Active Member of Clubs or Organizations: Not on file  . Attends Archivist Meetings: Not on file  . Marital Status: Not on file    ECOG Status: 0 - Asymptomatic  Review of Systems  Review of Systems: A 12 point ROS discussed and pertinent positives are indicated in the HPI above.  All other systems are negative.  Physical Exam No direct physical exam was performed (except for noted visual exam findings with Video Visits).    Vital Signs: LMP  (LMP Unknown)   Imaging: MR ABDOMEN WWO CONTRAST  Result Date: 08/14/2020 CLINICAL DATA:  Rectal cancer.  Liver metastases. EXAM: MRI ABDOMEN WITHOUT AND WITH CONTRAST TECHNIQUE: Multiplanar multisequence MR imaging of the abdomen was performed both before and after the administration of intravenous contrast. CONTRAST:  12m EOVIST GADOXETATE DISODIUM 0.25 MOL/L IV SOLN COMPARISON:  05/08/2020 FINDINGS: Lower chest: Unremarkable. Hepatobiliary: Lesion identified in segment VI previously now replaced by ablation defect. No substantial restricted diffusion at the  treatment site although there is some altered perfusion in this region, not unexpected. No definite suspicious enhancement. Lesion described previously in the posterior aspect of segment III has been replaced by a focus of T1 shortening on precontrast imaging, likely related to cysts granulation/scarring from ablation. No overtly suspicious enhancement in this region on postcontrast imaging although assessment is limited by motion artifact. A tiny focus  of restricted diffusion is identified in segment IV anteriorly (see diffusion image 50 of series 20). A 7 mm focus of flash enhancement is identified at this location on postcontrast imaging (image 47/series 9). This appears similar to the previous study. 6 mm focus of arterial phase hyperenhancement is identified in the inferior right liver (segment V), visible on 61/9. This lesion also restricts diffusion and appears to be new in the interval since the prior study. Gallbladder is contracted with multiple stones evident. No intrahepatic or extrahepatic biliary dilation. Pancreas: No focal mass lesion. No dilatation of the main duct. No intraparenchymal cyst. No peripancreatic edema. Spleen:  No splenomegaly. No focal mass lesion. Adrenals/Urinary Tract: No adrenal nodule or mass. Kidneys unremarkable. Stomach/Bowel: Stomach is unremarkable. No gastric wall thickening. No evidence of outlet obstruction. Duodenum is normally positioned as is the ligament of Treitz. No small bowel or colonic dilatation within the visualized abdomen. Vascular/Lymphatic: No abdominal aortic aneurysm. No abdominal lymphadenopathy Other:  No intraperitoneal free fluid. Musculoskeletal: No focal suspicious marrow enhancement within the visualized bony anatomy. IMPRESSION: 1. Interval ablation of lesions in segment VI and posterior segment III without definite findings to suggest residual disease/local recurrence. 2. There are 2 tiny foci of restricted diffusion in the liver on today's exam.  Both show arterial phase hyperenhancement. One of these is near the junction of segment IV A and IV B, not substantially changed in the interval. The other is in the inferior right liver (segment V) and is new since the prior study. Metastatic disease a concern. Close attention on follow-up recommended. 3. Cholelithiasis. Electronically Signed   By: Misty Stanley M.D.   On: 08/14/2020 11:04   IR Radiologist Eval & Mgmt  Result Date: 08/15/2020 Please refer to notes tab for details about interventional procedure. (Op Note)   Labs:  CBC: Recent Labs    06/17/20 1513 06/19/20 0715 07/11/20 1052  WBC 5.7 4.6 4.3  HGB 13.7 12.7 13.3  HCT 42.7 38.2 39.6  PLT 270 221 254    COAGS: Recent Labs    06/19/20 0715 07/11/20 1054  INR 1.1 1.1    BMP: Recent Labs    10/27/19 1148 04/11/20 1102 06/17/20 1513 07/11/20 1052  NA 140 142 139 141  K 3.8 4.1 4.7 3.8  CL 104 104 103 107  CO2 27 26 27 28   GLUCOSE 114* 103* 95 111*  BUN 16 14 22* 18  CALCIUM 9.1 9.7 9.5 9.8  CREATININE 1.05* 1.21* 0.88 1.14*  GFRNONAA 59* 50* >60 54*  GFRAA >60 58* >60  --     LIVER FUNCTION TESTS: Recent Labs    06/17/20 1513 07/11/20 1052  BILITOT 0.6 0.6  AST 45* 36  ALT 58* 46*  ALKPHOS 102 118  PROT 8.0 7.6  ALBUMIN 4.1 3.6    TUMOR MARKERS: No results for input(s): AFPTM, CEA, CA199, CHROMGRNA in the last 8760 hours.  Assessment and Plan:  We met today via teleconference to discuss in more detail her treatment plan. She was able to meet with Dr. Benay Spice as well as travel to [facility] for a second opinion. Both Dr. Benay Spice and the oncologist at [facility] recommended systemic chemotherapy given the relatively rapid development of additional metastatic foci in the liver.  She is somewhat concerned that we are still not seeing the entirety of her disease process. She has also confused as to why she should consider systemic chemotherapy when there  are only 2 livers in  the lesion which could be targeted and microwaved. She stated that she is interested in having head to toe scans to make sure we know exactly how much disease burden she has. She tells me that she has her CT scans ordered for this coming Monday. Additionally, she is to undergo placement of a portacatheter tomorrow.  I explained that the benefit of undergoing systemic chemotherapy is that the chemotherapy fights not only the 2 lesions that we can see in the liver, but any additional circulating tumor cells, or microscopic nests of cells that may be in the liver or elsewhere. Certainly microwaving the 2 lesions in the liver is possible, but we would be taking the risk that any additional nonvisible sites of disease will blossom over time if not treated with systemic chemotherapy now.  We discussed the possibility of proceeding with microwave ablation and beginning chemotherapy. I also let her know that that could be overkill if the chemotherapy is effective and treats the 2 small sites in her liver, she may not have needed the microwave procedure. Ultimately, I recommended that she consider proceeding with systemic chemotherapy and we will reassess her liver on her next set of surveillance scans, approximately 3 months from now. If her liver lesions remain present or enlarge and there is no evidence of new systemic disease, proceeding with a definitive microwave ablation at that time would be very reasonable.  Norma Colon seems to understand the plan and is in agreement. She asked me to reach out to Dr. Benay Spice which I will do.  1.) Next follow-up appointment in approximately 3 months following her next round of staging scans.    Electronically Signed: Criselda Peaches 09/03/2020, 10:34 AM   I spent a total of  15 Minutes in remote  clinical consultation, greater than 50% of which was counseling/coordinating care for rectal adenocarcinoma mets to liver.    Visit type: Audio  only (telephone). Audio (no video) only due to patient request. Alternative for in-person consultation at [facility], [address], [place], [place]. This visit type was conducted due to national recommendations for restrictions regarding the COVID-19 Pandemic (e.g. social distancing).  This format is felt to be most appropriate for this patient at this time.  All issues noted in this document were discussed and addressed.

## 2020-09-04 ENCOUNTER — Ambulatory Visit (HOSPITAL_COMMUNITY)
Admission: RE | Admit: 2020-09-04 | Discharge: 2020-09-04 | Disposition: A | Discharge status: Home / Self Care | Payer: 59 | Source: Ambulatory Visit | Attending: Oncology | Admitting: Oncology

## 2020-09-04 ENCOUNTER — Other Ambulatory Visit: Payer: Self-pay | Admitting: Oncology

## 2020-09-04 ENCOUNTER — Encounter (HOSPITAL_COMMUNITY): Payer: Self-pay

## 2020-09-04 ENCOUNTER — Inpatient Hospital Stay: Payer: 59 | Attending: Oncology

## 2020-09-04 ENCOUNTER — Other Ambulatory Visit: Payer: Self-pay

## 2020-09-04 DIAGNOSIS — G43909 Migraine, unspecified, not intractable, without status migrainosus: Secondary | ICD-10-CM | POA: Insufficient documentation

## 2020-09-04 DIAGNOSIS — C787 Secondary malignant neoplasm of liver and intrahepatic bile duct: Secondary | ICD-10-CM | POA: Diagnosis not present

## 2020-09-04 DIAGNOSIS — C20 Malignant neoplasm of rectum: Secondary | ICD-10-CM | POA: Insufficient documentation

## 2020-09-04 DIAGNOSIS — E041 Nontoxic single thyroid nodule: Secondary | ICD-10-CM | POA: Diagnosis not present

## 2020-09-04 DIAGNOSIS — R200 Anesthesia of skin: Secondary | ICD-10-CM | POA: Diagnosis not present

## 2020-09-04 DIAGNOSIS — G2581 Restless legs syndrome: Secondary | ICD-10-CM | POA: Insufficient documentation

## 2020-09-04 DIAGNOSIS — Z5111 Encounter for antineoplastic chemotherapy: Secondary | ICD-10-CM | POA: Insufficient documentation

## 2020-09-04 DIAGNOSIS — Z87891 Personal history of nicotine dependence: Secondary | ICD-10-CM | POA: Diagnosis not present

## 2020-09-04 HISTORY — PX: IR IMAGING GUIDED PORT INSERTION: IMG5740

## 2020-09-04 LAB — CMP (CANCER CENTER ONLY)
ALT: 43 U/L (ref 0–44)
AST: 39 U/L (ref 15–41)
Albumin: 3.8 g/dL (ref 3.5–5.0)
Alkaline Phosphatase: 129 U/L — ABNORMAL HIGH (ref 38–126)
Anion gap: 8 (ref 5–15)
BUN: 16 mg/dL (ref 6–20)
CO2: 27 mmol/L (ref 22–32)
Calcium: 10.1 mg/dL (ref 8.9–10.3)
Chloride: 105 mmol/L (ref 98–111)
Creatinine: 1.2 mg/dL — ABNORMAL HIGH (ref 0.44–1.00)
GFR, Estimated: 53 mL/min — ABNORMAL LOW (ref >60)
Glucose, Bld: 135 mg/dL — ABNORMAL HIGH (ref 70–99)
Potassium: 4.7 mmol/L (ref 3.5–5.1)
Sodium: 140 mmol/L (ref 135–145)
Total Bilirubin: 0.7 mg/dL (ref 0.3–1.2)
Total Protein: 8.4 g/dL — ABNORMAL HIGH (ref 6.5–8.1)

## 2020-09-04 LAB — CBC WITH DIFFERENTIAL (CANCER CENTER ONLY)
Abs Immature Granulocytes: 0.03 10*3/uL (ref 0.00–0.07)
Basophils Absolute: 0 10*3/uL (ref 0.0–0.1)
Basophils Relative: 1 %
Eosinophils Absolute: 0.1 10*3/uL (ref 0.0–0.5)
Eosinophils Relative: 2 %
HCT: 42.1 % (ref 36.0–46.0)
Hemoglobin: 13.6 g/dL (ref 12.0–15.0)
Immature Granulocytes: 1 %
Lymphocytes Relative: 15 %
Lymphs Abs: 0.9 10*3/uL (ref 0.7–4.0)
MCH: 30.3 pg (ref 26.0–34.0)
MCHC: 32.3 g/dL (ref 30.0–36.0)
MCV: 93.8 fL (ref 80.0–100.0)
Monocytes Absolute: 0.4 10*3/uL (ref 0.1–1.0)
Monocytes Relative: 7 %
Neutro Abs: 4.2 10*3/uL (ref 1.7–7.7)
Neutrophils Relative %: 74 %
Platelet Count: 301 10*3/uL (ref 150–400)
RBC: 4.49 MIL/uL (ref 3.87–5.11)
RDW: 12.4 % (ref 11.5–15.5)
WBC Count: 5.7 10*3/uL (ref 4.0–10.5)
nRBC: 0 % (ref 0.0–0.2)

## 2020-09-04 LAB — CEA (IN HOUSE-CHCC): CEA (CHCC-In House): 3.5 ng/mL (ref 0.00–5.00)

## 2020-09-04 MED ORDER — FENTANYL CITRATE (PF) 100 MCG/2ML IJ SOLN
INTRAMUSCULAR | Status: AC
  Filled 2020-09-04: qty 2

## 2020-09-04 MED ORDER — LIDOCAINE-EPINEPHRINE (PF) 1 %-1:200000 IJ SOLN
INTRAMUSCULAR | Status: AC | PRN
  Administered 2020-09-04 (×2): 10 mL via INTRADERMAL

## 2020-09-04 MED ORDER — HEPARIN SOD (PORK) LOCK FLUSH 100 UNIT/ML IV SOLN
INTRAVENOUS | Status: AC
  Filled 2020-09-04: qty 5

## 2020-09-04 MED ORDER — SODIUM CHLORIDE 0.9 % IV SOLN: INTRAVENOUS | Status: DC

## 2020-09-04 MED ORDER — FENTANYL CITRATE (PF) 100 MCG/2ML IJ SOLN
INTRAMUSCULAR | Status: AC | PRN
Start: 2020-09-04 — End: 2020-09-04
  Administered 2020-09-04 (×4): 50 ug via INTRAVENOUS

## 2020-09-04 MED ORDER — CEFAZOLIN SODIUM-DEXTROSE 2-4 GM/100ML-% IV SOLN
INTRAVENOUS | Status: AC
  Administered 2020-09-04: 2 g via INTRAVENOUS
  Filled 2020-09-04: qty 100

## 2020-09-04 MED ORDER — CEFAZOLIN SODIUM-DEXTROSE 2-4 GM/100ML-% IV SOLN: 2.0000 g | Freq: Once | INTRAVENOUS | Status: AC

## 2020-09-04 MED ORDER — MIDAZOLAM HCL 2 MG/2ML IJ SOLN
INTRAMUSCULAR | Status: AC
  Filled 2020-09-04: qty 4

## 2020-09-04 MED ORDER — MIDAZOLAM HCL 2 MG/2ML IJ SOLN
INTRAMUSCULAR | Status: AC | PRN
  Administered 2020-09-04 (×4): 1 mg via INTRAVENOUS

## 2020-09-04 MED ORDER — LIDOCAINE-EPINEPHRINE 1 %-1:100000 IJ SOLN
INTRAMUSCULAR | Status: AC
  Filled 2020-09-04: qty 1

## 2020-09-04 NOTE — Discharge Instructions (Addendum)
Implanted Port Insertion, Care After This sheet gives you information about how to care for yourself after your procedure. Your health care provider may also give you more specific instructions. If you have problems or questions, contact your health care provider. What can I expect after the procedure? After the procedure, it is common to have:  Discomfort at the port insertion site.  Bruising on the skin over the port. This should improve over 3-4 days. Follow these instructions at home: Port care  After your port is placed, you will get a manufacturer's information card. The card has information about your port. Keep this card with you at all times.  Take care of the port as told by your health care provider. Ask your health care provider if you or a family member can get training for taking care of the port at home. A home health care nurse may also take care of the port.  Make sure to remember what type of port you have. Incision care      Follow instructions from your health care provider about how to take care of your port insertion site. Make sure you: ? Wash your hands with soap and water before and after you change your bandage (dressing). If soap and water are not available, use hand sanitizer. ? Change your dressing as told by your health care provider. ? Leave stitches (sutures), skin glue, or adhesive strips in place. These skin closures may need to stay in place for 2 weeks or longer. If adhesive strip edges start to loosen and curl up, you may trim the loose edges. Do not remove adhesive strips completely unless your health care provider tells you to do that.  Check your port insertion site every day for signs of infection. Check for: ? Redness, swelling, or pain. ? Fluid or blood. ? Warmth. ? Pus or a bad smell. Activity  Return to your normal activities as told by your health care provider. Ask your health care provider what activities are safe for you.  Do not  lift anything that is heavier than 10 lb (4.5 kg), or the limit that you are told, until your health care provider says that it is safe. General instructions  Take over-the-counter and prescription medicines only as told by your health care provider.  Do not take baths, swim, or use a hot tub until your health care provider approves. Ask your health care provider if you may take showers. You may only be allowed to take sponge baths.  Do not drive for 24 hours if you were given a sedative during your procedure.  Wear a medical alert bracelet in case of an emergency. This will tell any health care providers that you have a port.  Keep all follow-up visits as told by your health care provider. This is important. Contact a health care provider if:  You cannot flush your port with saline as directed, or you cannot draw blood from the port.  You have a fever or chills.  You have redness, swelling, or pain around your port insertion site.  You have fluid or blood coming from your port insertion site.  Your port insertion site feels warm to the touch.  You have pus or a bad smell coming from the port insertion site. Get help right away if:  You have chest pain or shortness of breath.  You have bleeding from your port that you cannot control. Summary  Take care of the port as told by your health   care provider. Keep the manufacturer's information card with you at all times.  Change your dressing as told by your health care provider.  Contact a health care provider if you have a fever or chills or if you have redness, swelling, or pain around your port insertion site.  Keep all follow-up visits as told by your health care provider. This information is not intended to replace advice given to you by your health care provider. Make sure you discuss any questions you have with your health care provider. Document Revised: 04/19/2018 Document Reviewed: 04/19/2018 Elsevier Patient Education   2020 [employer] Implanted Port Home Guide An implanted port is a device that is placed under the skin. It is usually placed in the chest. The device can be used to give IV medicine, to take blood, or for dialysis. You may have an implanted port if:  You need IV medicine that would be irritating to the small veins in your hands or arms.  You need IV medicines, such as antibiotics, for a long period of time.  You need IV nutrition for a long period of time.  You need dialysis. Having a port means that your health care provider will not need to use the veins in your arms for these procedures. You may have fewer limitations when using a port than you would if you used other types of long-term IVs, and you will likely be able to return to normal activities after your incision heals. An implanted port has two main parts:  Reservoir. The reservoir is the part where a needle is inserted to give medicines or draw blood. The reservoir is round. After it is placed, it appears as a small, raised area under your skin.  Catheter. The catheter is a thin, flexible tube that connects the reservoir to a vein. Medicine that is inserted into the reservoir goes into the catheter and then into the vein. How is my port accessed? To access your port:  A numbing cream may be placed on the skin over the port site.  Your health care provider will put on a mask and sterile gloves.  The skin over your port will be cleaned carefully with a germ-killing soap and allowed to dry.  Your health care provider will gently pinch the port and insert a needle into it.  Your health care provider will check for a blood return to make sure the port is in the vein and is not clogged.  If your port needs to remain accessed to get medicine continuously (constant infusion), your health care provider will place a clear bandage (dressing) over the needle site. The dressing and needle will need to be changed every week, or as told  by your health care provider. What is flushing? Flushing helps keep the port from getting clogged. Follow instructions from your health care provider about how and when to flush the port. Ports are usually flushed with saline solution or a medicine called heparin. The need for flushing will depend on how the port is used:  If the port is only used from time to time to give medicines or draw blood, the port may need to be flushed: ? Before and after medicines have been given. ? Before and after blood has been drawn. ? As part of routine maintenance. Flushing may be recommended every 4-6 weeks.  If a constant infusion is running, the port may not need to be flushed.  Throw away any syringes in a disposal container that is meant   for sharp items (sharps container). You can buy a sharps container from a pharmacy, or you can make one by using an empty hard plastic bottle with a cover. How long will my port stay implanted? The port can stay in for as long as your health care provider thinks it is needed. When it is time for the port to come out, a surgery will be done to remove it. The surgery will be similar to the procedure that was done to put the port in. Follow these instructions at home:   Flush your port as told by your health care provider.  If you need an infusion over several days, follow instructions from your health care provider about how to take care of your port site. Make sure you: ? Wash your hands with soap and water before you change your dressing. If soap and water are not available, use alcohol-based hand sanitizer. ? Change your dressing as told by your health care provider. ? Place any used dressings or infusion bags into a plastic bag. Throw that bag in the trash. ? Keep the dressing that covers the needle clean and dry. Do not get it wet. ? Do not use scissors or sharp objects near the tube. ? Keep the tube clamped, unless it is being used.  Check your port site every day  for signs of infection. Check for: ? Redness, swelling, or pain. ? Fluid or blood. ? Pus or a bad smell.  Protect the skin around the port site. ? Avoid wearing bra straps that rub or irritate the site. ? Protect the skin around your port from seat belts. Place a soft pad over your chest if needed.  Bathe or shower as told by your health care provider. The site may get wet as long as you are not actively receiving an infusion.  Return to your normal activities as told by your health care provider. Ask your health care provider what activities are safe for you.  Carry a medical alert card or wear a medical alert bracelet at all times. This will let health care providers know that you have an implanted port in case of an emergency. Get help right away if:  You have redness, swelling, or pain at the port site.  You have fluid or blood coming from your port site.  You have pus or a bad smell coming from the port site.  You have a fever. Summary  Implanted ports are usually placed in the chest for long-term IV access.  Follow instructions from your health care provider about flushing the port and changing bandages (dressings).  Take care of the area around your port by avoiding clothing that puts pressure on the area, and by watching for signs of infection.  Protect the skin around your port from seat belts. Place a soft pad over your chest if needed.  Get help right away if you have a fever or you have redness, swelling, pain, drainage, or a bad smell at the port site. This information is not intended to replace advice given to you by your health care provider. Make sure you discuss any questions you have with your health care provider. Document Revised: 01/13/2019 Document Reviewed: 10/24/2016 Elsevier Patient Education  2020 [employer]. Moderate Conscious Sedation, Adult, Care After These instructions provide you with information about caring for yourself after your procedure.  Your health care provider may also give you more specific instructions. Your treatment has been planned according to current medical practices, but   problems sometimes occur. Call your health care provider if you have any problems or questions after your procedure. What can I expect after the procedure? After your procedure, it is common:  To feel sleepy for several hours.  To feel clumsy and have poor balance for several hours.  To have poor judgment for several hours.  To vomit if you eat too soon. Follow these instructions at home: For at least 24 hours after the procedure:   Do not: ? Participate in activities where you could fall or become injured. ? Drive. ? Use heavy machinery. ? Drink alcohol. ? Take sleeping pills or medicines that cause drowsiness. ? Make important decisions or sign legal documents. ? Take care of children on your own.  Rest. Eating and drinking  Follow the diet recommended by your health care provider.  If you vomit: ? Drink water, juice, or soup when you can drink without vomiting. ? Make sure you have little or no nausea before eating solid foods. General instructions  Have a responsible adult stay with you until you are awake and alert.  Take over-the-counter and prescription medicines only as told by your health care provider.  If you smoke, do not smoke without supervision.  Keep all follow-up visits as told by your health care provider. This is important. Contact a health care provider if:  You keep feeling nauseous or you keep vomiting.  You feel light-headed.  You develop a rash.  You have a fever. Get help right away if:  You have trouble breathing. This information is not intended to replace advice given to you by your health care provider. Make sure you discuss any questions you have with your health care provider. Document Revised: 09/03/2017 Document Reviewed: 01/11/2016 Elsevier Patient Education  [place] may remove the dressing after 3 pm tomorrow, shower, pat the area dry.You do not have to put anything else back over the site.  It will look bruised and swollen, you may use an ice pack over the site if  needed. You can not use the elma cream or lidocaine cream until the skin glue has come off, this usually takes 2 weeks. As the glue is coming off, it's peeling up, do not pick at the site. It will be sore, you may take what you usually take for aches and pain. Recommend a button-up shirt to wear to chemo, place the power port card in your wallet. Please make sure to tell everyone that you have a port.

## 2020-09-04 NOTE — Procedures (Signed)
Interventional Radiology Procedure Note ° °Procedure: Single Lumen Power Port Placement   ° °Access:  Right internal jugular vein ° °Findings: Catheter tip positioned at cavoatrial junction. Port is ready for immediate use.  ° °Complications: None ° °EBL: < 10 mL ° °Recommendations:  °- Ok to shower in 24 hours °- Do not submerge for 7 days °- Routine line care  ° ° °Ellina Sivertsen, MD ° ° ° °

## 2020-09-04 NOTE — H&P (Signed)
Referring Physician(s): Ladell Pier  Supervising Physician: Suttle,D  Patient Status:  [facility]  Chief Complaint:  "I'm here for a port a cath"  Subjective: Patient familiar to IR service from right nephrostomy in 2020, segment 6 liver lesion biopsy along with microwave ablation of segments 3 and 6 liver lesions on 06/19/2020. She has a history of metastatic rectal adenocarcinoma.  Recent imaging has revealed additional foci of metastatic disease to the liver and she presents again today for Port-A-Cath placement for chemotherapy.  She currently denies fever, chest pain, dyspnea, cough, nausea, vomiting or bleeding.  She does have occasional headaches and intermittent abdominal discomfort.  Past Medical History:  Diagnosis Date  . Adenocarcinoma of rectum [facility]) oncologist-- dr Benay Spice--  per last note in epic ,  clinical remission   dx 08/ 2019---- chemoradiation concurrent completed 07-20-2018;  s/p  low anterior resection 09-16-2018;   chemo 10-17-2018  ot 12-19-2018  . Anxiety   . Arthritis   . Chemotherapy induced neutropenia ([place])   . Depression   . Headache    migraines   . History of HPV infection   . History of kidney stones   . Hydronephrosis, right   . Restless leg syndrome   . Scoliosis   . Sepsis ([place])    11-2018   Past Surgical History:  Procedure Laterality Date  . COLPOSCOPY  04/04/2018  . CYSTOSCOPY WITH RETROGRADE PYELOGRAM, URETEROSCOPY AND STENT PLACEMENT Right 12/12/2018   Procedure: CYSTOSCOPY WITH RETROGRADE PYELOGRAM, URETEROSCOPY, STONE EXTRACTION AND STENT PLACEMENT;  Surgeon: Cleon Gustin, MD;  Location: [facility];  Service: Urology;  Laterality: Right;  . CYSTOSCOPY WITH RETROGRADE PYELOGRAM, URETEROSCOPY AND STENT PLACEMENT Right 04/17/2019   Procedure: CYSTOSCOPY WITH RETROGRADE PYELOGRAM, BALLOON [place]  URETEROSCOPY AND STENT PLACEMENT;  Surgeon: Cleon Gustin, MD;  Location: [facility];  Service: Urology;  Laterality: Right;  . CYSTOSCOPY WITH STENT PLACEMENT Right 11/21/2018   Procedure: CYSTOSCOPY, RIGHT RETROGRADE PYELOGRAM, WITH RIGHT URETERAL STENT PLACEMENT;  Surgeon: Cleon Gustin, MD;  Location: [facility];  Service: Urology;  Laterality: Right;  . IR NEPHROSTOMY PLACEMENT RIGHT  06/22/2019  . IR PATIENT EVAL TECH 0-60 MINS  06/28/2019  . IR RADIOLOGIST EVAL & MGMT  06/06/2020  . IR RADIOLOGIST EVAL & MGMT  07/17/2020  . IR RADIOLOGIST EVAL & MGMT  08/15/2020  . IR RADIOLOGIST EVAL & MGMT  09/03/2020  . left forearm surgery due to calcium rock     . RADIOLOGY WITH ANESTHESIA N/A 06/19/2020   Procedure: CT WITH ANESTHESIA MICROWAVE ABLATION;  Surgeon: Jacqulynn Cadet, MD;  Location: [facility];  Service: Anesthesiology;  Laterality: N/A;  . ROBOT ASSISTED PYELOPLASTY Right 08/03/2019   Procedure: XI ROBOTIC ASSISTED PYELOPLASTY WITH STENT PLACEMENT;  Surgeon: Cleon Gustin, MD;  Location: [facility];  Service: Urology;  Laterality: Right;  3 HRS  . SHOULDER ARTHROSCOPY W/ ROTATOR CUFF REPAIR Right   . XI ROBOTIC ASSISTED LOWER ANTERIOR RESECTION N/A 09/16/2018   Procedure: XI ROBOTIC ASSISTED LOWER ANTERIOR RESECTION ERAS PATHWAY;  Surgeon: Leighton Ruff, MD;  Location: [facility];  Service: General;  Laterality: N/A;      Allergies: Codeine  Medications: Prior to Admission medications   Medication Sig Start Date End Date Taking? Authorizing Provider  alprazolam Duanne Moron) 2 MG tablet Take 2 mg by mouth every morning.    Yes [provider]  buPROPion (WELLBUTRIN XL) 300 MG 24 hr tablet Take 300 mg by mouth every morning.  Yes [provider]  Ascorbic Acid (VITAMIN C PO) Take 1 tablet by mouth every morning.     [provider]  aspirin EC 81 MG tablet Take 81 mg by mouth every morning. Swallow whole.     [provider]  BIOTIN PO Take 1 tablet by mouth every morning.     [provider]  bisacodyl (BISACODYL) 5  MG EC tablet Take 10 mg by mouth in the morning and at bedtime.     [provider]  Calcium Carbonate-Vitamin D (CALCIUM-D PO) Take 1 tablet by mouth See admin instructions. Take 1 tablet by mouth of either calcium or calcium +D every morning     [provider]  CALCIUM PO Take 1 tablet by mouth See admin instructions. Take 1 tablet by mouth of either calcium or calcium +D every morning     [provider]  Cholecalciferol (VITAMIN D3 PO) Take 1 tablet by mouth every morning.     [provider]  cyclobenzaprine (FLEXERIL) 10 MG tablet Take 10 mg by mouth at bedtime.     [provider]  HYDROcodone-acetaminophen (NORCO) 10-325 MG tablet Take 1-2 tablets by mouth every 6 (six) hours as needed for moderate pain or severe pain (headache). Post-Operatively. Patient taking differently: Take 1-2 tablets by mouth every 4 (four) hours as needed for moderate pain or severe pain (headache). Post-Operatively. 08/05/19   Alexis Frock, MD  MAGNESIUM CITRATE PO Take 0.5 Bottles by mouth every morning.     [provider]  MAGNESIUM PO Take 1 tablet by mouth every morning.     [provider]  Multiple Vitamin (MULTIVITAMIN WITH MINERALS) TABS tablet Take 1 tablet by mouth every morning.     [provider]  Prenatal Vit-Fe Fumarate-FA (PRENATAL VITAMIN PO) Take 1 tablet by mouth every morning.     [provider]  Probiotic Product (PROBIOTIC PO) Take 1 tablet by mouth every morning.     [provider]  psyllium (METAMUCIL) 58.6 % powder Take 1 packet by mouth daily.     [provider]  rOPINIRole (REQUIP) 0.25 MG tablet Take 0.25 mg by mouth at bedtime.    [provider]  Sennosides-Docusate Sodium (SENNA-DOCUSATE SODIUM PO) Take 2 tablets by mouth every morning. Ducolax Brand     [provider]  Simethicone (GAS-X PO) Take 1 tablet by mouth every morning.     [provider]      Vital Signs: BP (!) 130/91   Pulse 95   Temp 98.3 F (36.8 C) (Oral)   Resp 18   LMP  (LMP Unknown)   SpO2 95%   Physical Exam awake, alert.  Chest with distant but clear breath sounds bilaterally.  Heart with regular rate and rhythm.  Abdomen soft, positive bowel sounds, currently nontender.  No lower extremity edema.  Imaging: IR Radiologist Eval & Mgmt  Result Date: 09/03/2020 Please refer to notes tab for details about interventional procedure. (Op Note)   Labs:  CBC: Recent Labs    06/17/20 1513 06/19/20 0715 07/11/20 1052 09/04/20 1211  WBC 5.7 4.6 4.3 5.7  HGB 13.7 12.7 13.3 13.6  HCT 42.7 38.2 39.6 42.1  PLT 270 221 254 301    COAGS: Recent Labs    06/19/20 0715 07/11/20 1054  INR 1.1 1.1    BMP: Recent Labs    10/27/19 1148 10/27/19 1148 04/11/20 1102 06/17/20 1513 07/11/20 1052 09/04/20 1211  NA 140   < >  142 139 141 140  K 3.8   < > 4.1 4.7 3.8 4.7  CL 104   < > 104 103 107 105  CO2 27   < > 26 27 28 27   GLUCOSE 114*   < > 103* 95 111* 135*  BUN 16   < > 14 22* 18 16  CALCIUM 9.1   < > 9.7 9.5 9.8 10.1  CREATININE 1.05*   < > 1.21* 0.88 1.14* 1.20*  GFRNONAA 59*   < > 50* >60 54* 53*  GFRAA >60  --  58* >60  --   --    < > = values in this interval not displayed.    LIVER FUNCTION TESTS: Recent Labs    06/17/20 1513 07/11/20 1052 09/04/20 1211  BILITOT 0.6 0.6 0.7  AST 45* 36 39  ALT 58* 46* 43  ALKPHOS 102 118 129*  PROT 8.0 7.6 8.4*  ALBUMIN 4.1 3.6 3.8    Assessment and Plan: Patient familiar to IR service from right nephrostomy in 2020, segment 6 liver lesion biopsy along with microwave ablation of segments 3 and 6 liver lesions on 06/19/2020. She has a history of metastatic rectal adenocarcinoma.  Recent imaging has revealed additional foci of metastatic disease to the liver and she presents again today for Port-A-Cath placement for chemotherapy. Risks and benefits of image guided port-a-catheter placement was  discussed with the patient including, but not limited to bleeding, infection, pneumothorax, or fibrin sheath development and need for additional procedures.  All of the patient's questions were answered, patient is agreeable to proceed. Consent signed and in chart.     Electronically Signed: D. Rowe Robert, PA-C 09/04/2020, 1:18 PM   I spent a total of 25 minutes at the the patient's bedside AND on the patient's hospital floor or unit, greater than 50% of which was counseling/coordinating care for Port-A-Cath placement

## 2020-09-04 NOTE — Progress Notes (Signed)
Patient states she is pretty independent and can take care of herself.  Patient states there is no need to update her driver with information.  Patient states I live alone and take care of myself.  Went ahead and explained d/c teaching to patient pre-precedure.  No driving, no important decisions for 24 hours, leave dressing on for 24 hours, then may remove.  Explained surgical glue will be in place and to allow the glue to flake off as patient heals.  Explained s&s of infection (fever, oozing/bleeding from port site, bad odor, swelling) related to port site.  Let [place] know if these symptoms are noted. Reminded patient not to use the numbing cream provided by the cancer center for 2 weeks.  Patient voiced understanding to all teaching.   Patients ride confirmed via phone call. Informed Rowe Robert, PA that patient does live alone, & she does have a driver today. He knodded with understanding.

## 2020-09-09 ENCOUNTER — Other Ambulatory Visit: Payer: Self-pay

## 2020-09-09 ENCOUNTER — Encounter (HOSPITAL_COMMUNITY): Payer: Self-pay

## 2020-09-09 ENCOUNTER — Ambulatory Visit (HOSPITAL_COMMUNITY)
Admission: RE | Admit: 2020-09-09 | Discharge: 2020-09-09 | Disposition: A | Discharge status: Home / Self Care | Payer: 59 | Source: Ambulatory Visit | Attending: Nurse Practitioner | Admitting: Nurse Practitioner

## 2020-09-09 DIAGNOSIS — C20 Malignant neoplasm of rectum: Secondary | ICD-10-CM | POA: Insufficient documentation

## 2020-09-09 MED ORDER — IOHEXOL 300 MG/ML  SOLN
100.0000 mL | Freq: Once | INTRAMUSCULAR | Status: AC | PRN
  Administered 2020-09-09: 100 mL via INTRAVENOUS

## 2020-09-12 ENCOUNTER — Other Ambulatory Visit: Payer: Self-pay

## 2020-09-12 ENCOUNTER — Inpatient Hospital Stay (HOSPITAL_BASED_OUTPATIENT_CLINIC_OR_DEPARTMENT_OTHER): Payer: 59 | Admitting: Oncology

## 2020-09-12 VITALS — BP 120/69 | HR 109 | Temp 98.1°F | Resp 18 | Ht 68.0 in | Wt 222.4 lb

## 2020-09-12 DIAGNOSIS — C20 Malignant neoplasm of rectum: Secondary | ICD-10-CM | POA: Diagnosis not present

## 2020-09-12 DIAGNOSIS — Z7189 Other specified counseling: Secondary | ICD-10-CM | POA: Diagnosis not present

## 2020-09-12 DIAGNOSIS — Z5111 Encounter for antineoplastic chemotherapy: Secondary | ICD-10-CM | POA: Diagnosis not present

## 2020-09-12 MED ORDER — LIDOCAINE-PRILOCAINE 2.5-2.5 % EX CREA: 1.0000 "application " | TOPICAL_CREAM | CUTANEOUS | 1 refills | Status: DC

## 2020-09-12 MED ORDER — ONDANSETRON HCL 8 MG PO TABS: 8.0000 mg | ORAL_TABLET | Freq: Three times a day (TID) | ORAL | 1 refills | Status: DC | PRN

## 2020-09-12 MED ORDER — PROCHLORPERAZINE MALEATE 10 MG PO TABS: 10.0000 mg | ORAL_TABLET | Freq: Four times a day (QID) | ORAL | 1 refills | Status: DC | PRN

## 2020-09-12 NOTE — Progress Notes (Signed)
DISCONTINUE ON PATHWAY REGIMEN - Colorectal     A cycle is every 21 days:     Capecitabine      Oxaliplatin   **Always confirm dose/schedule in your pharmacy ordering system**  REASON: Disease Progression PRIOR TREATMENT: ROS57: Cape[place] q21 Days x 6 Cycles TREATMENT RESPONSE: Unable to Evaluate  START ON PATHWAY REGIMEN - Colorectal     A cycle is every 14 days:     Bevacizumab-xxxx      Irinotecan      Leucovorin      Fluorouracil      Fluorouracil   **Always confirm dose/schedule in your pharmacy ordering system**  Patient Characteristics: Distant Metastases, Nonsurgical Candidate, KRAS/NRAS Mutation Positive/Unknown (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 

## 2020-09-12 NOTE — Progress Notes (Signed)
[facility] OFFICE PROGRESS NOTE   Diagnosis: Colon cancer  INTERVAL HISTORY:   Norma Colon returns for a scheduled visit. She underwent restaging CTs on 09/09/2020. There are stable ablation sites in segment 6 and segment 2. Small foci of hyperenhancement were noted in the inferior right liver segment 6 and in the anterior right liver segment 5/8. No other evidence of metastatic disease. Dr. Laurence Ferrari recommends proceeding with systemic therapy.   Objective:  Vital signs in last 24 hours:  Blood pressure 120/69, pulse (!) 109, temperature 98.1 F (36.7 C), temperature source Tympanic, resp. rate 18, height 5' 8" (1.727 m), weight 222 lb 6.4 oz (100.9 kg), SpO2 100 %.    Lymphatics: No cervical, supraclavicular, axillary, or inguinal nodes Resp: Lungs clear bilaterally Cardio: Regular rate and rhythm GI: No hepatosplenomegaly, no mass, nontender Vascular: No leg edema    Portacath/PICC-without erythema  Lab Results:  Lab Results  Component Value Date   WBC 5.7 09/04/2020   HGB 13.6 09/04/2020   HCT 42.1 09/04/2020   MCV 93.8 09/04/2020   PLT 301 09/04/2020   NEUTROABS 4.2 09/04/2020    CMP  Lab Results  Component Value Date   NA 140 09/04/2020   K 4.7 09/04/2020   CL 105 09/04/2020   CO2 27 09/04/2020   GLUCOSE 135 (H) 09/04/2020   BUN 16 09/04/2020   CREATININE 1.20 (H) 09/04/2020   CALCIUM 10.1 09/04/2020   PROT 8.4 (H) 09/04/2020   ALBUMIN 3.8 09/04/2020   AST 39 09/04/2020   ALT 43 09/04/2020   ALKPHOS 129 (H) 09/04/2020   BILITOT 0.7 09/04/2020   GFRNONAA 53 (L) 09/04/2020   GFRAA >60 06/17/2020    Lab Results  Component Value Date   CEA1 3.50 09/04/2020    Lab Results  Component Value Date   INR 1.1 07/11/2020    Imaging:  CT Chest W Contrast  Result Date: 09/09/2020 CLINICAL DATA:  Metastatic rectal cancer restaging, assess treatment response, status post ablation of liver metastases EXAM: CT CHEST, ABDOMEN, AND PELVIS  WITH CONTRAST TECHNIQUE: Multidetector CT imaging of the chest, abdomen and pelvis was performed following the standard protocol during bolus administration of intravenous contrast. CONTRAST:  146m OMNIPAQUE IOHEXOL 300 MG/ML SOLN, additional oral enteric contrast COMPARISON:  CT chest abdomen pelvis, 04/11/2020, MR abdomen, 05/08/2020 08/13/2020 FINDINGS: CT CHEST FINDINGS Cardiovascular: Right chest port catheter. Normal heart size. No pericardial effusion. Mediastinum/Nodes: No enlarged mediastinal, hilar, or axillary lymph nodes. Thyroid gland, trachea, and esophagus demonstrate no significant findings. Lungs/Pleura: Scattered irregular ground-glass opacities in the posterior right upper lobe (series 6, image 64). No pleural effusion or pneumothorax. Musculoskeletal: No chest wall mass or suspicious bone lesions identified. CT ABDOMEN PELVIS FINDINGS Hepatobiliary: Hepatic steatosis. Redemonstrated ablation sites of the posteroinferior right lobe of the liver, hepatic segment VI (series 2, image 68) and the posterior left lobe of the liver, hepatic segment II (series 2, image 61), generally unchanged in appearance when compared to prior MR dated 08/13/2020. Redemonstrated small focus of hyperenhancement measuring 7 mm in the inferior right lobe of the liver, hepatic segment VI, anterior to the ablation site (series 2, image 67) as well as in the anterior right lobe of the liver measuring 7 mm, hepatic segment V/VIII (series 2, image 58). Gallstones contracted in the gallbladder. No biliary ductal dilatation. Pancreas: Unremarkable. No pancreatic ductal dilatation or surrounding inflammatory changes. Spleen: Normal in size without significant abnormality. Adrenals/Urinary Tract: Adrenal glands are unremarkable. Kidneys are normal, without renal  calculi, solid lesion, or hydronephrosis. Redemonstrated postoperative findings of right ureteral reimplantation. Stomach/Bowel: Stomach is within normal limits. Appendix  appears normal. No evidence of bowel wall thickening, distention, or inflammatory changes. Redemonstrated postoperative findings of rectosigmoid resection and reanastomosis. Vascular/Lymphatic: No significant vascular findings are present. No enlarged abdominal or pelvic lymph nodes. Reproductive: No mass or other abnormality. Other: No abdominal wall hernia or abnormality. No abdominopelvic ascites. Musculoskeletal: No acute or significant osseous findings. Chronic L5 pars defects and degenerative anterolisthesis of L5 on S1. IMPRESSION: 1. Redemonstrated ablation sites of the posteroinferior right lobe of the liver, hepatic segment VI and the posterior left lobe of the liver, hepatic segment II, generally unchanged in appearance when compared to prior MR dated 08/13/2020. Persistent viability or recurrent disease following ablation is generally best assessed by MRI. 2. Redemonstrated small focus of hyperenhancement measuring 7 mm in the inferior right lobe of the liver, hepatic segment VI, anterior to the ablation site as well as in the anterior right lobe of the liver measuring 7 mm, hepatic segment V/VIII. These are unchanged compared to prior MRI and remain concerning for metastatic disease or hepatocellular carcinoma, but could reflect flash filling hemangiomata. Attention on follow-up. 3. No other evidence of metastatic disease in the chest, abdomen, or pelvis. No local mass or lymphadenopathy following rectosigmoid resection. 4. Scattered irregular ground-glass opacities in the posterior right upper lobe, nonspecific and infectious or inflammatory. 5. Hepatic steatosis. 6. Cholelithiasis. Electronically Signed   By: Eddie Candle M.D.   On: 09/09/2020 12:10   CT Abdomen Pelvis W Contrast  Result Date: 09/09/2020 CLINICAL DATA:  Metastatic rectal cancer restaging, assess treatment response, status post ablation of liver metastases EXAM: CT CHEST, ABDOMEN, AND PELVIS WITH CONTRAST TECHNIQUE: Multidetector  CT imaging of the chest, abdomen and pelvis was performed following the standard protocol during bolus administration of intravenous contrast. CONTRAST:  113m OMNIPAQUE IOHEXOL 300 MG/ML SOLN, additional oral enteric contrast COMPARISON:  CT chest abdomen pelvis, 04/11/2020, MR abdomen, 05/08/2020 08/13/2020 FINDINGS: CT CHEST FINDINGS Cardiovascular: Right chest port catheter. Normal heart size. No pericardial effusion. Mediastinum/Nodes: No enlarged mediastinal, hilar, or axillary lymph nodes. Thyroid gland, trachea, and esophagus demonstrate no significant findings. Lungs/Pleura: Scattered irregular ground-glass opacities in the posterior right upper lobe (series 6, image 64). No pleural effusion or pneumothorax. Musculoskeletal: No chest wall mass or suspicious bone lesions identified. CT ABDOMEN PELVIS FINDINGS Hepatobiliary: Hepatic steatosis. Redemonstrated ablation sites of the posteroinferior right lobe of the liver, hepatic segment VI (series 2, image 68) and the posterior left lobe of the liver, hepatic segment II (series 2, image 61), generally unchanged in appearance when compared to prior MR dated 08/13/2020. Redemonstrated small focus of hyperenhancement measuring 7 mm in the inferior right lobe of the liver, hepatic segment VI, anterior to the ablation site (series 2, image 67) as well as in the anterior right lobe of the liver measuring 7 mm, hepatic segment V/VIII (series 2, image 58). Gallstones contracted in the gallbladder. No biliary ductal dilatation. Pancreas: Unremarkable. No pancreatic ductal dilatation or surrounding inflammatory changes. Spleen: Normal in size without significant abnormality. Adrenals/Urinary Tract: Adrenal glands are unremarkable. Kidneys are normal, without renal calculi, solid lesion, or hydronephrosis. Redemonstrated postoperative findings of right ureteral reimplantation. Stomach/Bowel: Stomach is within normal limits. Appendix appears normal. No evidence of bowel  wall thickening, distention, or inflammatory changes. Redemonstrated postoperative findings of rectosigmoid resection and reanastomosis. Vascular/Lymphatic: No significant vascular findings are present. No enlarged abdominal or pelvic lymph nodes. Reproductive: No mass  or other abnormality. Other: No abdominal wall hernia or abnormality. No abdominopelvic ascites. Musculoskeletal: No acute or significant osseous findings. Chronic L5 pars defects and degenerative anterolisthesis of L5 on S1. IMPRESSION: 1. Redemonstrated ablation sites of the posteroinferior right lobe of the liver, hepatic segment VI and the posterior left lobe of the liver, hepatic segment II, generally unchanged in appearance when compared to prior MR dated 08/13/2020. Persistent viability or recurrent disease following ablation is generally best assessed by MRI. 2. Redemonstrated small focus of hyperenhancement measuring 7 mm in the inferior right lobe of the liver, hepatic segment VI, anterior to the ablation site as well as in the anterior right lobe of the liver measuring 7 mm, hepatic segment V/VIII. These are unchanged compared to prior MRI and remain concerning for metastatic disease or hepatocellular carcinoma, but could reflect flash filling hemangiomata. Attention on follow-up. 3. No other evidence of metastatic disease in the chest, abdomen, or pelvis. No local mass or lymphadenopathy following rectosigmoid resection. 4. Scattered irregular ground-glass opacities in the posterior right upper lobe, nonspecific and infectious or inflammatory. 5. Hepatic steatosis. 6. Cholelithiasis. Electronically Signed   By: Eddie Candle M.D.   On: 09/09/2020 12:10    Medications: I have reviewed the patient's current medications.   Assessment/Plan: 1.  Rectal cancer ? 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 CTs  05/24/2018-mass beginning at proximal he 7.6 cm from the anal verge, "20 "perirectal lymph nodes, no evidence of metastatic disease ? Pelvic MRI 06/01/2018-tumor measured at 10.8 cm from the anal sphincter, T3b, N1-2 left perirectal lymph nodes, each measuring 5 mm ? Radiation and Xeloda initiated 06/13/2018, completed 07/20/2018 ? Low anterior resection 09/16/2018,ypT3,ypN1c, 1 cm tumor, partial response-score 2, 0/13 lymph nodes positive, 2 tumor deposits, MSI-stable, no loss of mismatch repair protein expression ? Cycle 1 CAPOX 10/17/2018 ? Cycle 2 CAPOX 11/07/2018 (oxaliplatin dose reduced due to neutropenia) ? Cycle 3 CAPOX 11/28/2018 ? Cycle 4 CAPOX 12/19/2018 ? CTs 05/29/2019-no evidence of metastatic disease, severe right hydronephrosis, left thyroid nodule ? CTs 04/11/2020-subtle nodular lesion in the inferior right liver suspicious for a new metastasis, status post right ureteral resection and psoas hitch/flap, resolution of hydronephrosis, sacral insufficiency fractures bilaterally ? MRI liver 05/08/2020-2.1 cm segment 6 lesion suspicious for a metastasis, 4 mm suspicious segment 3 lesion 4 mm segment 4B lesion consistent with a hemangioma, no abdominal lymphadenopathy, cholelithiasis ? Ablation of segment 6 and segment 3 lesions 06/19/2020, pathology from segment 6 biopsy confirmed metastatic adenocarcinoma ? MRI abdomen 08/13/2020-ablation defects in segment 6 and segment 3, tiny foci of restricted diffusion with arterial phase enhancement at the junctions of segment 4A and segment 4B, and segment 5 concerning for metastatic disease ? CTs 09/09/2020-stable ablation sites at segment 6 and segment 2. Small focus of hyperenhancement measuring 7 mm in the inferior right liver segment 6 anterior to the ablation site, and in the anterior right liver segment 5/8-unchanged compared to MRI and concerning for metastatic disease, no other evidence of metastatic disease. Scattered irregular groundglass opacities in  the posterior right upper lobe 2. History of Tobacco use 3. 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 Alyson Ingles, urology 4. Migraine headaches 5. Restless legs 6. Arm pain during  the oxaliplatin infusion and for 1 week after cycle 1. The oxaliplatin will be further diluted and infusion time extended getting with cycle 2. 7. Neutropenia secondary to chemotherapy. Oxaliplatin dose reduced beginning with cycle 2. 8. 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 Right hydronephrosis on CT 05/29/2019-referred to Dr. Alyson Ingles, surgical reimplantation of the right ureter on 08/03/2019, right ureter stent removed 09/14/2019 11. Thyroid nodules-new versus enlarged left thyroid nodule CT 05/29/2019, thyroid ultrasound 06/20/2019-multiple nodules, 1 right-sided nodule met criteria for ultrasound follow-up  Ultrasound 05/08/2020-multinodular goiter, stable nodules in the isthmus and right thyroid meeting criteria for 1 year follow-up, additional stable nodules do not meet criteria for follow-up    Disposition: Norma Colon has been diagnosed with metastatic rectal cancer. She appears asymptomatic. The recent MRI and CTs are concerning for multiple small liver metastases. Dr. Laurence Ferrari reviewed the images and indicated to me that he feels there are small metastases in addition to the 2 lesions reported.  Norma Colon would like to begin salvage systemic therapy. We are waiting on results of [facility] testing. I recommend proceeding with FOLFIRI/Avastin. We reviewed potential toxicities associated with the FOLFIRI regimen including the chance for nausea/vomiting, mucositis, diarrhea, alopecia, and hematologic  toxicity. We discussed the abdominal pain and acute/delayed diarrhea associated with irinotecan. We discussed the sun sensitivity, rash, hand/foot syndrome, and hyperpigmentation seen with 5-fluorouracil. We reviewed the allergic reaction, hypertension, thromboembolic disease, delayed wound healing, bowel perforation, bleeding, CNS toxicity, and nephrotoxicity associated with bevacizumab. She agrees to proceed.  The plan is to begin FOLFIRI/Avastin on 09/23/2020. She will return for an office visit and cycle 2 on 10/08/2020.  We will follow repeat imaging of the liver as a marker of tumor response. The plan is to obtain a restaging CT or MRI after 5 cycles of FOLFIRI/Avastin.  She understands chemotherapy is not curative in this setting. She may be a candidate for repeat ablation therapy in the future.  Betsy Coder, MD  09/12/2020  2:21 PM

## 2020-09-13 ENCOUNTER — Inpatient Hospital Stay: Payer: 59 | Admitting: Oncology

## 2020-09-17 ENCOUNTER — Telehealth: Payer: Self-pay | Admitting: Oncology

## 2020-09-17 ENCOUNTER — Encounter: Payer: Self-pay | Admitting: *Deleted

## 2020-09-17 NOTE — Progress Notes (Signed)
Pharmacist Chemotherapy Monitoring - Initial Assessment    Anticipated start date: 09/23/2020   Regimen:  . Are orders appropriate based on the patient's diagnosis, regimen, and cycle? Yes . Does the plan date match the patient's scheduled date? Yes . Is the sequencing of drugs appropriate? Yes . Are the premedications appropriate for the patient's regimen? Yes . Prior Authorization for treatment is: Approved o If applicable, is the correct biosimilar selected based on the patient's insurance? yes  Organ Function and Labs: [name] Are dose adjustments needed based on the patient's renal function, hepatic function, or hematologic function? Yes . Are appropriate labs ordered prior to the start of patient's treatment? Yes . Other organ system assessment, if indicated: bevacizumab: baseline BP . The following baseline labs, if indicated, have been ordered: bevacizumab: urine protein  Dose Assessment: . Are the drug doses appropriate? Yes . Are the following correct: o Drug concentrations Yes o IV fluid compatible with drug Yes o Administration routes Yes o Timing of therapy Yes . If applicable, does the patient have documented access for treatment and/or plans for port-a-cath placement? not applicable . If applicable, have lifetime cumulative doses been properly documented and assessed? yes Lifetime Dose Tracking  . Oxaliplatin: 425.278 mg/m2 (850 mg) = 70.88 % of the maximum lifetime dose of 600 mg/m2  o   Toxicity Monitoring/Prevention: . The patient has the following take home antiemetics prescribed: Ondansetron and Prochlorperazine . The patient has the following take home medications prescribed: N/A . Medication allergies and previous infusion related reactions, if applicable, have been reviewed and addressed. No . The patient's current medication list has been assessed for drug-drug interactions with their chemotherapy regimen. no significant drug-drug interactions were identified on  review.  Order Review: . Are the treatment plan orders signed? Yes . Is the patient scheduled to see a provider prior to their treatment? Yes  I verify that I have reviewed each item in the above checklist and answered each question accordingly.  Morse Brueggemann D 09/17/2020 10:23 AM

## 2020-09-17 NOTE — Progress Notes (Signed)
Faxed notification from [facility] that tissue has been received for testing. 

## 2020-09-17 NOTE — Telephone Encounter (Signed)
Scheduled per 12/13 staff msg. Called pt and left a msg

## 2020-09-18 ENCOUNTER — Telehealth: Payer: Self-pay

## 2020-09-18 NOTE — Telephone Encounter (Signed)
I spoke with pt regarding her concern about having multiple appt. Pt was unclear why she had multiple appt on the same day and I advised of the flow of things in the [place] and why there is a separate appt time for her port flush w/labs and her infusion. Pt expressed understanding of this information and all of her questions/concerns were answered.

## 2020-09-18 NOTE — Telephone Encounter (Signed)
Duplicate telephone encounter- opened in error

## 2020-09-19 ENCOUNTER — Encounter: Payer: Self-pay | Admitting: *Deleted

## 2020-09-20 ENCOUNTER — Other Ambulatory Visit: Payer: Self-pay | Admitting: *Deleted

## 2020-09-20 ENCOUNTER — Telehealth: Payer: Self-pay | Admitting: Urology

## 2020-09-20 DIAGNOSIS — C20 Malignant neoplasm of rectum: Secondary | ICD-10-CM

## 2020-09-20 NOTE — Telephone Encounter (Signed)
I forwarded you her scans from December. Pt will call back and reschedule her office visit

## 2020-09-20 NOTE — Telephone Encounter (Signed)
Patient wants to know if she needs to keep her appointment on 12/22? She asks if Dr Alyson Ingles would rather see her after her chemo? She also asks if Dr Alyson Ingles has seen her scans and everything that she's been doing. She asks that a nurse call her.

## 2020-09-22 ENCOUNTER — Other Ambulatory Visit: Payer: Self-pay | Admitting: Oncology

## 2020-09-23 ENCOUNTER — Inpatient Hospital Stay: Payer: 59

## 2020-09-23 ENCOUNTER — Other Ambulatory Visit: Payer: Self-pay

## 2020-09-23 VITALS — BP 145/84 | HR 92 | Temp 98.4°F | Resp 18

## 2020-09-23 DIAGNOSIS — C20 Malignant neoplasm of rectum: Secondary | ICD-10-CM

## 2020-09-23 DIAGNOSIS — Z5111 Encounter for antineoplastic chemotherapy: Secondary | ICD-10-CM | POA: Diagnosis not present

## 2020-09-23 DIAGNOSIS — C787 Secondary malignant neoplasm of liver and intrahepatic bile duct: Secondary | ICD-10-CM

## 2020-09-23 LAB — CMP (CANCER CENTER ONLY)
ALT: 47 U/L — ABNORMAL HIGH (ref 0–44)
AST: 39 U/L (ref 15–41)
Albumin: 3.4 g/dL — ABNORMAL LOW (ref 3.5–5.0)
Alkaline Phosphatase: 99 U/L (ref 38–126)
Anion gap: 11 (ref 5–15)
BUN: 12 mg/dL (ref 6–20)
CO2: 24 mmol/L (ref 22–32)
Calcium: 9.2 mg/dL (ref 8.9–10.3)
Chloride: 108 mmol/L (ref 98–111)
Creatinine: 1 mg/dL (ref 0.44–1.00)
GFR, Estimated: >60 mL/min (ref >60)
Glucose, Bld: 121 mg/dL — ABNORMAL HIGH (ref 70–99)
Potassium: 3.5 mmol/L (ref 3.5–5.1)
Sodium: 143 mmol/L (ref 135–145)
Total Bilirubin: 0.6 mg/dL (ref 0.3–1.2)
Total Protein: 6.9 g/dL (ref 6.5–8.1)

## 2020-09-23 LAB — CBC WITH DIFFERENTIAL (CANCER CENTER ONLY)
Abs Immature Granulocytes: 0.02 10*3/uL (ref 0.00–0.07)
Basophils Absolute: 0 10*3/uL (ref 0.0–0.1)
Basophils Relative: 1 %
Eosinophils Absolute: 0.3 10*3/uL (ref 0.0–0.5)
Eosinophils Relative: 6 %
HCT: 36.1 % (ref 36.0–46.0)
Hemoglobin: 12 g/dL (ref 12.0–15.0)
Immature Granulocytes: 0 %
Lymphocytes Relative: 21 %
Lymphs Abs: 1.1 10*3/uL (ref 0.7–4.0)
MCH: 30.7 pg (ref 26.0–34.0)
MCHC: 33.2 g/dL (ref 30.0–36.0)
MCV: 92.3 fL (ref 80.0–100.0)
Monocytes Absolute: 0.5 10*3/uL (ref 0.1–1.0)
Monocytes Relative: 10 %
Neutro Abs: 3.4 10*3/uL (ref 1.7–7.7)
Neutrophils Relative %: 62 %
Platelet Count: 223 10*3/uL (ref 150–400)
RBC: 3.91 MIL/uL (ref 3.87–5.11)
RDW: 13.2 % (ref 11.5–15.5)
WBC Count: 5.4 10*3/uL (ref 4.0–10.5)
nRBC: 0 % (ref 0.0–0.2)

## 2020-09-23 LAB — TOTAL PROTEIN, URINE DIPSTICK: Protein, ur: NEGATIVE mg/dL

## 2020-09-23 MED ORDER — ATROPINE SULFATE 1 MG/ML IJ SOLN
INTRAMUSCULAR | Status: AC
  Filled 2020-09-23: qty 1

## 2020-09-23 MED ORDER — SODIUM CHLORIDE 0.9 % IV SOLN
5.0000 mg/kg | Freq: Once | INTRAVENOUS | Status: AC
  Administered 2020-09-23: 10:00:00 500 mg via INTRAVENOUS
  Filled 2020-09-23: qty 16

## 2020-09-23 MED ORDER — SODIUM CHLORIDE 0.9 % IV SOLN
400.0000 mg/m2 | Freq: Once | INTRAVENOUS | Status: AC
  Administered 2020-09-23: 11:00:00 880 mg via INTRAVENOUS
  Filled 2020-09-23: qty 44

## 2020-09-23 MED ORDER — FLUOROURACIL CHEMO INJECTION 2.5 GM/50ML
400.0000 mg/m2 | Freq: Once | INTRAVENOUS | Status: AC
  Administered 2020-09-23: 12:00:00 900 mg via INTRAVENOUS
  Filled 2020-09-23: qty 18

## 2020-09-23 MED ORDER — SODIUM CHLORIDE 0.9 % IV SOLN
180.0000 mg/m2 | Freq: Once | INTRAVENOUS | Status: AC
  Administered 2020-09-23: 11:00:00 400 mg via INTRAVENOUS
  Filled 2020-09-23: qty 15

## 2020-09-23 MED ORDER — SODIUM CHLORIDE 0.9 % IV SOLN
10.0000 mg | Freq: Once | INTRAVENOUS | Status: AC
  Administered 2020-09-23: 10:00:00 10 mg via INTRAVENOUS
  Filled 2020-09-23: qty 10

## 2020-09-23 MED ORDER — SODIUM CHLORIDE 0.9 % IV SOLN
Freq: Once | INTRAVENOUS | Status: AC
  Filled 2020-09-23: qty 250

## 2020-09-23 MED ORDER — SODIUM CHLORIDE 0.9 % IV SOLN
2400.0000 mg/m2 | INTRAVENOUS | Status: DC
  Administered 2020-09-23: 5300 mg via INTRAVENOUS
  Filled 2020-09-23: qty 106

## 2020-09-23 MED ORDER — PALONOSETRON HCL INJECTION 0.25 MG/5ML
0.2500 mg | Freq: Once | INTRAVENOUS | Status: AC
  Administered 2020-09-23: 10:00:00 0.25 mg via INTRAVENOUS

## 2020-09-23 MED ORDER — PALONOSETRON HCL INJECTION 0.25 MG/5ML
INTRAVENOUS | Status: AC
  Filled 2020-09-23: qty 5

## 2020-09-23 MED ORDER — ATROPINE SULFATE 1 MG/ML IJ SOLN: 0.5000 mg | Freq: Once | INTRAMUSCULAR | Status: DC | PRN

## 2020-09-23 NOTE — Patient Instructions (Signed)

## 2020-09-23 NOTE — Patient Instructions (Signed)
[facility] Discharge Instructions for Patients Receiving Chemotherapy  Today you received the following chemotherapy agents: Bevacizumab (Avastin), Irinotecan, Leucovorin, and Fluorouracil (5FU)  To help prevent nausea and vomiting after your treatment, we encourage you to take your nausea medication as prescribed.  DO NOT TAKE ZOFRAN (ONDANSTERON) FOR 3 DAYS AFTER TREATMENT!  If you develop nausea and vomiting that is not controlled by your nausea medication, call the clinic.   BELOW ARE SYMPTOMS THAT SHOULD BE REPORTED IMMEDIATELY:  *FEVER GREATER THAN 100.5 F  *CHILLS WITH OR WITHOUT FEVER  NAUSEA AND VOMITING THAT IS NOT CONTROLLED WITH YOUR NAUSEA MEDICATION  *UNUSUAL SHORTNESS OF BREATH  *UNUSUAL BRUISING OR BLEEDING  TENDERNESS IN MOUTH AND THROAT WITH OR WITHOUT PRESENCE OF ULCERS  *URINARY PROBLEMS  *BOWEL PROBLEMS  UNUSUAL RASH Items with * indicate a potential emergency and should be followed up as soon as possible.  Feel free to call the clinic should you have any questions or concerns. The clinic phone number is [phone].  Please show the CHEMO ALERT CARD at check-in to the Emergency Department and triage nurse.     Bevacizumab injection What is this medicine? BEVACIZUMAB (be va SIZ yoo mab) is a monoclonal antibody. It is used to treat many types of cancer. This medicine may be used for other purposes; ask your health care provider or pharmacist if you have questions. COMMON BRAND NAME(S): Avastin, MVASI, Zirabev What should I tell my health care provider before I take this medicine? They need to know if you have any of these conditions:  diabetes  heart disease  high blood pressure  history of coughing up blood  prior anthracycline chemotherapy (e.g., doxorubicin, daunorubicin, epirubicin)  recent or ongoing radiation therapy  recent or planning to have surgery  stroke  an unusual or allergic reaction to bevacizumab,  hamster proteins, mouse proteins, other medicines, foods, dyes, or preservatives  pregnant or trying to get pregnant  breast-feeding How should I use this medicine? This medicine is for infusion into a vein. It is given by a health care professional in a hospital or clinic setting. Talk to your pediatrician regarding the use of this medicine in children. Special care may be needed. Overdosage: If you think you have taken too much of this medicine contact a poison control center or emergency room at once. NOTE: This medicine is only for you. Do not share this medicine with others. What if I miss a dose? It is important not to miss your dose. Call your doctor or health care professional if you are unable to keep an appointment. What may interact with this medicine? Interactions are not expected. This list may not describe all possible interactions. Give your health care provider a list of all the medicines, herbs, non-prescription drugs, or dietary supplements you use. Also tell them if you smoke, drink alcohol, or use illegal drugs. Some items may interact with your medicine. What should I watch for while using this medicine? Your condition will be monitored carefully while you are receiving this medicine. You will need important blood work and urine testing done while you are taking this medicine. This medicine may increase your risk to bruise or bleed. Call your doctor or health care professional if you notice any unusual bleeding. Before having surgery, talk to your health care provider to make sure it is ok. This drug can increase the risk of poor healing of your surgical site or wound. You will need to stop this drug for   28 days before surgery. After surgery, wait at least 28 days before restarting this drug. Make sure the surgical site or wound is healed enough before restarting this drug. Talk to your health care provider if questions. Do not become pregnant while taking this medicine or for  6 months after stopping it. Women should inform their doctor if they wish to become pregnant or think they might be pregnant. There is a potential for serious side effects to an unborn child. Talk to your health care professional or pharmacist for more information. Do not breast-feed an infant while taking this medicine and for 6 months after the last dose. This medicine has caused ovarian failure in some women. This medicine may interfere with the ability to have a child. You should talk to your doctor or health care professional if you are concerned about your fertility. What side effects may I notice from receiving this medicine? Side effects that you should report to your doctor or health care professional as soon as possible:  allergic reactions like skin rash, itching or hives, swelling of the face, lips, or tongue  chest pain or chest tightness  chills  coughing up blood  high fever  seizures  severe constipation  signs and symptoms of bleeding such as bloody or black, tarry stools; red or dark-brown urine; spitting up blood or brown material that looks like coffee grounds; red spots on the skin; unusual bruising or bleeding from the eye, gums, or nose  signs and symptoms of a blood clot such as breathing problems; chest pain; severe, sudden headache; pain, swelling, warmth in the leg  signs and symptoms of a stroke like changes in vision; confusion; trouble speaking or understanding; severe headaches; sudden numbness or weakness of the face, arm or leg; trouble walking; dizziness; loss of balance or coordination  stomach pain  sweating  swelling of legs or ankles  vomiting  weight gain Side effects that usually do not require medical attention (report to your doctor or health care professional if they continue or are bothersome):  back pain  changes in taste  decreased appetite  dry skin  nausea  tiredness This list may not describe all possible side effects.  Call your doctor for medical advice about side effects. You may report side effects to FDA at 1-800-FDA-1088. Where should I keep my medicine? This drug is given in a hospital or clinic and will not be stored at home. NOTE: This sheet is a summary. It may not cover all possible information. If you have questions about this medicine, talk to your doctor, pharmacist, or health care provider.  2020 Elsevier/Gold Standard (2019-07-19 10:50:46)    Leucovorin injection What is this medicine? LEUCOVORIN (loo koe VOR in) is used to prevent or treat the harmful effects of some medicines. This medicine is used to treat anemia caused by a low amount of folic acid in the body. It is also used with 5-fluorouracil (5-FU) to treat colon cancer. This medicine may be used for other purposes; ask your health care provider or pharmacist if you have questions. What should I tell my health care provider before I take this medicine? They need to know if you have any of these conditions:  anemia from low levels of vitamin B-12 in the blood  an unusual or allergic reaction to leucovorin, folic acid, other medicines, foods, dyes, or preservatives  pregnant or trying to get pregnant  breast-feeding How should I use this medicine? This medicine is for injection into a   muscle or into a vein. It is given by a health care professional in a hospital or clinic setting. Talk to your pediatrician regarding the use of this medicine in children. Special care may be needed. Overdosage: If you think you have taken too much of this medicine contact a poison control center or emergency room at once. NOTE: This medicine is only for you. Do not share this medicine with others. What if I miss a dose? This does not apply. What may interact with this medicine?  capecitabine  fluorouracil  phenobarbital  phenytoin  primidone  trimethoprim-sulfamethoxazole This list may not describe all possible interactions. Give your  health care provider a list of all the medicines, herbs, non-prescription drugs, or dietary supplements you use. Also tell them if you smoke, drink alcohol, or use illegal drugs. Some items may interact with your medicine. What should I watch for while using this medicine? Your condition will be monitored carefully while you are receiving this medicine. This medicine may increase the side effects of 5-fluorouracil, 5-FU. Tell your doctor or health care professional if you have diarrhea or mouth sores that do not get better or that get worse. What side effects may I notice from receiving this medicine? Side effects that you should report to your doctor or health care professional as soon as possible:  allergic reactions like skin rash, itching or hives, swelling of the face, lips, or tongue  breathing problems  fever, infection  mouth sores  unusual bleeding or bruising  unusually weak or tired Side effects that usually do not require medical attention (report to your doctor or health care professional if they continue or are bothersome):  constipation or diarrhea  loss of appetite  nausea, vomiting This list may not describe all possible side effects. Call your doctor for medical advice about side effects. You may report side effects to FDA at 1-800-FDA-1088. Where should I keep my medicine? This drug is given in a hospital or clinic and will not be stored at home. NOTE: This sheet is a summary. It may not cover all possible information. If you have questions about this medicine, talk to your doctor, pharmacist, or health care provider.  2020 Elsevier/Gold Standard (2008-03-27 16:50:29)   Irinotecan injection What is this medicine? IRINOTECAN (ir in oh TEE kan ) is a chemotherapy drug. It is used to treat colon and rectal cancer. This medicine may be used for other purposes; ask your health care provider or pharmacist if you have questions. COMMON BRAND NAME(S): Camptosar What  should I tell my health care provider before I take this medicine? They need to know if you have any of these conditions:  dehydration  diarrhea  infection (especially a virus infection such as chickenpox, cold sores, or herpes)  liver disease  low blood counts, like low white cell, platelet, or red cell counts  low levels of calcium, magnesium, or potassium in the blood  recent or ongoing radiation therapy  an unusual or allergic reaction to irinotecan, other medicines, foods, dyes, or preservatives  pregnant or trying to get pregnant  breast-feeding How should I use this medicine? This drug is given as an infusion into a vein. It is administered in a hospital or clinic by a specially trained health care professional. Talk to your pediatrician regarding the use of this medicine in children. Special care may be needed. Overdosage: If you think you have taken too much of this medicine contact a poison control center or emergency room at once.   NOTE: This medicine is only for you. Do not share this medicine with others. What if I miss a dose? It is important not to miss your dose. Call your doctor or health care professional if you are unable to keep an appointment. What may interact with this medicine? This medicine may interact with the following medications:  antiviral medicines for HIV or AIDS  certain antibiotics like rifampin or rifabutin  certain medicines for fungal infections like itraconazole, ketoconazole, posaconazole, and voriconazole  certain medicines for seizures like carbamazepine, phenobarbital, phenotoin  clarithromycin  gemfibrozil  nefazodone  St. John's Wort This list may not describe all possible interactions. Give your health care provider a list of all the medicines, herbs, non-prescription drugs, or dietary supplements you use. Also tell them if you smoke, drink alcohol, or use illegal drugs. Some items may interact with your medicine. What  should I watch for while using this medicine? Your condition will be monitored carefully while you are receiving this medicine. You will need important blood work done while you are taking this medicine. This drug may make you feel generally unwell. This is not uncommon, as chemotherapy can affect healthy cells as well as cancer cells. Report any side effects. Continue your course of treatment even though you feel ill unless your doctor tells you to stop. In some cases, you may be given additional medicines to help with side effects. Follow all directions for their use. You may get drowsy or dizzy. Do not drive, use machinery, or do anything that needs mental alertness until you know how this medicine affects you. Do not stand or sit up quickly, especially if you are an older patient. This reduces the risk of dizzy or fainting spells. Call your health care professional for advice if you get a fever, chills, or sore throat, or other symptoms of a cold or flu. Do not treat yourself. This medicine decreases your body's ability to fight infections. Try to avoid being around people who are sick. Avoid taking products that contain aspirin, acetaminophen, ibuprofen, naproxen, or ketoprofen unless instructed by your doctor. These medicines may hide a fever. This medicine may increase your risk to bruise or bleed. Call your doctor or health care professional if you notice any unusual bleeding. Be careful brushing and flossing your teeth or using a toothpick because you may get an infection or bleed more easily. If you have any dental work done, tell your dentist you are receiving this medicine. Do not become pregnant while taking this medicine or for 6 months after stopping it. Women should inform their health care professional if they wish to become pregnant or think they might be pregnant. Men should not father a child while taking this medicine and for 3 months after stopping it. There is potential for serious  side effects to an unborn child. Talk to your health care professional for more information. Do not breast-feed an infant while taking this medicine or for 7 days after stopping it. This medicine has caused ovarian failure in some women. This medicine may make it more difficult to get pregnant. Talk to your health care professional if you are concerned about your fertility. This medicine has caused decreased sperm counts in some men. This may make it more difficult to father a child. Talk to your health care professional if you are concerned about your fertility. What side effects may I notice from receiving this medicine? Side effects that you should report to your doctor or health care professional as   soon as possible:  allergic reactions like skin rash, itching or hives, swelling of the face, lips, or tongue  chest pain  diarrhea  flushing, runny nose, sweating during infusion  low blood counts - this medicine may decrease the number of white blood cells, red blood cells and platelets. You may be at increased risk for infections and bleeding.  nausea, vomiting  pain, swelling, warmth in the leg  signs of decreased platelets or bleeding - bruising, pinpoint red spots on the skin, black, tarry stools, blood in the urine  signs of infection - fever or chills, cough, sore throat, pain or difficulty passing urine  signs of decreased red blood cells - unusually weak or tired, fainting spells, lightheadedness Side effects that usually do not require medical attention (report to your doctor or health care professional if they continue or are bothersome):  constipation  hair loss  headache  loss of appetite  mouth sores  stomach pain This list may not describe all possible side effects. Call your doctor for medical advice about side effects. You may report side effects to FDA at 1-800-FDA-1088. Where should I keep my medicine? This drug is given in a hospital or clinic and will not  be stored at home. NOTE: This sheet is a summary. It may not cover all possible information. If you have questions about this medicine, talk to your doctor, pharmacist, or health care provider.  2020 Elsevier/Gold Standard (2018-11-11 10:09:17)   Fluorouracil, 5-FU injection What is this medicine? FLUOROURACIL, 5-FU (flure oh YOOR a sil) is a chemotherapy drug. It slows the growth of cancer cells. This medicine is used to treat many types of cancer like breast cancer, colon or rectal cancer, pancreatic cancer, and stomach cancer. This medicine may be used for other purposes; ask your health care provider or pharmacist if you have questions. COMMON BRAND NAME(S): Adrucil What should I tell my health care provider before I take this medicine? They need to know if you have any of these conditions:  blood disorders  dihydropyrimidine dehydrogenase (DPD) deficiency  infection (especially a virus infection such as chickenpox, cold sores, or herpes)  kidney disease  liver disease  malnourished, poor nutrition  recent or ongoing radiation therapy  an unusual or allergic reaction to fluorouracil, other chemotherapy, other medicines, foods, dyes, or preservatives  pregnant or trying to get pregnant  breast-feeding How should I use this medicine? This drug is given as an infusion or injection into a vein. It is administered in a hospital or clinic by a specially trained health care professional. Talk to your pediatrician regarding the use of this medicine in children. Special care may be needed. Overdosage: If you think you have taken too much of this medicine contact a poison control center or emergency room at once. NOTE: This medicine is only for you. Do not share this medicine with others. What if I miss a dose? It is important not to miss your dose. Call your doctor or health care professional if you are unable to keep an appointment. What may interact with this  medicine?  allopurinol  cimetidine  dapsone  digoxin  hydroxyurea  leucovorin  levamisole  medicines for seizures like ethotoin, fosphenytoin, phenytoin  medicines to increase blood counts like filgrastim, pegfilgrastim, sargramostim  medicines that treat or prevent blood clots like warfarin, enoxaparin, and dalteparin  methotrexate  metronidazole  pyrimethamine  some other chemotherapy drugs like busulfan, cisplatin, estramustine, vinblastine  trimethoprim  trimetrexate  vaccines Talk to your doctor   or health care professional before taking any of these medicines:  acetaminophen  aspirin  ibuprofen  ketoprofen  naproxen This list may not describe all possible interactions. Give your health care provider a list of all the medicines, herbs, non-prescription drugs, or dietary supplements you use. Also tell them if you smoke, drink alcohol, or use illegal drugs. Some items may interact with your medicine. What should I watch for while using this medicine? Visit your doctor for checks on your progress. This drug may make you feel generally unwell. This is not uncommon, as chemotherapy can affect healthy cells as well as cancer cells. Report any side effects. Continue your course of treatment even though you feel ill unless your doctor tells you to stop. In some cases, you may be given additional medicines to help with side effects. Follow all directions for their use. Call your doctor or health care professional for advice if you get a fever, chills or sore throat, or other symptoms of a cold or flu. Do not treat yourself. This drug decreases your body's ability to fight infections. Try to avoid being around people who are sick. This medicine may increase your risk to bruise or bleed. Call your doctor or health care professional if you notice any unusual bleeding. Be careful brushing and flossing your teeth or using a toothpick because you may get an infection or bleed  more easily. If you have any dental work done, tell your dentist you are receiving this medicine. Avoid taking products that contain aspirin, acetaminophen, ibuprofen, naproxen, or ketoprofen unless instructed by your doctor. These medicines may hide a fever. Do not become pregnant while taking this medicine. Women should inform their doctor if they wish to become pregnant or think they might be pregnant. There is a potential for serious side effects to an unborn child. Talk to your health care professional or pharmacist for more information. Do not breast-feed an infant while taking this medicine. Men should inform their doctor if they wish to father a child. This medicine may lower sperm counts. Do not treat diarrhea with over the counter products. Contact your doctor if you have diarrhea that lasts more than 2 days or if it is severe and watery. This medicine can make you more sensitive to the sun. Keep out of the sun. If you cannot avoid being in the sun, wear protective clothing and use sunscreen. Do not use sun lamps or tanning beds/booths. What side effects may I notice from receiving this medicine? Side effects that you should report to your doctor or health care professional as soon as possible:  allergic reactions like skin rash, itching or hives, swelling of the face, lips, or tongue  low blood counts - this medicine may decrease the number of white blood cells, red blood cells and platelets. You may be at increased risk for infections and bleeding.  signs of infection - fever or chills, cough, sore throat, pain or difficulty passing urine  signs of decreased platelets or bleeding - bruising, pinpoint red spots on the skin, black, tarry stools, blood in the urine  signs of decreased red blood cells - unusually weak or tired, fainting spells, lightheadedness  breathing problems  changes in vision  chest pain  mouth sores  nausea and vomiting  pain, swelling, redness at site  where injected  pain, tingling, numbness in the hands or feet  redness, swelling, or sores on hands or feet  stomach pain  unusual bleeding Side effects that usually do not   require medical attention (report to your doctor or health care professional if they continue or are bothersome):  changes in finger or toe nails  diarrhea  dry or itchy skin  hair loss  headache  loss of appetite  sensitivity of eyes to the light  stomach upset  unusually teary eyes This list may not describe all possible side effects. Call your doctor for medical advice about side effects. You may report side effects to FDA at 1-800-FDA-1088. Where should I keep my medicine? This drug is given in a hospital or clinic and will not be stored at home. NOTE: This sheet is a summary. It may not cover all possible information. If you have questions about this medicine, talk to your doctor, pharmacist, or health care provider.  2020 Elsevier/Gold Standard (2008-01-25 13:53:16)  

## 2020-09-23 NOTE — Telephone Encounter (Signed)
Ct from 12/6 showed no hydronephrosis. Ok for her to schedule

## 2020-09-24 ENCOUNTER — Encounter: Payer: Self-pay | Admitting: *Deleted

## 2020-09-24 ENCOUNTER — Telehealth: Payer: Self-pay | Admitting: *Deleted

## 2020-09-24 ENCOUNTER — Other Ambulatory Visit: Payer: Self-pay

## 2020-09-24 ENCOUNTER — Inpatient Hospital Stay: Payer: 59

## 2020-09-24 NOTE — Patient Instructions (Signed)

## 2020-09-24 NOTE — Progress Notes (Signed)
Patient came in today for dressing change of port due to excess perspiration causing dressing to come loose. She requested RN review her medication list with her to be sure she is taking everything correctly and this was done.

## 2020-09-24 NOTE — Telephone Encounter (Signed)
-----   Message from Arman Bogus, RN sent at 09/23/2020  1:03 PM EST ----- Regarding: Benay Spice, 1st time [facility] pt, 1st time folfiri, tolerated well

## 2020-09-24 NOTE — Telephone Encounter (Signed)
Called pt to see how she did with her treatment yesterday.  She denies c/o's except port dsg has condensation under it & is coming loose.  Instructed to come back in to have dsg change. Message sent to schedulers.

## 2020-09-25 ENCOUNTER — Inpatient Hospital Stay: Payer: 59

## 2020-09-25 ENCOUNTER — Other Ambulatory Visit: Payer: Self-pay

## 2020-09-25 ENCOUNTER — Ambulatory Visit: Payer: 59 | Admitting: Urology

## 2020-09-25 VITALS — BP 143/83 | HR 96 | Resp 18

## 2020-09-25 DIAGNOSIS — C20 Malignant neoplasm of rectum: Secondary | ICD-10-CM

## 2020-09-25 DIAGNOSIS — Z5111 Encounter for antineoplastic chemotherapy: Secondary | ICD-10-CM | POA: Diagnosis not present

## 2020-09-25 MED ORDER — HEPARIN SOD (PORK) LOCK FLUSH 100 UNIT/ML IV SOLN
500.0000 [IU] | Freq: Once | INTRAVENOUS | Status: AC | PRN
  Administered 2020-09-25: 11:00:00 500 [IU]
  Filled 2020-09-25: qty 5

## 2020-09-25 MED ORDER — SODIUM CHLORIDE 0.9% FLUSH
10.0000 mL | INTRAVENOUS | Status: DC | PRN
  Administered 2020-09-25: 11:00:00 10 mL
  Filled 2020-09-25: qty 10

## 2020-09-26 ENCOUNTER — Encounter (HOSPITAL_COMMUNITY): Payer: Self-pay

## 2020-09-30 ENCOUNTER — Encounter (HOSPITAL_COMMUNITY): Payer: Self-pay | Admitting: Oncology

## 2020-10-02 ENCOUNTER — Other Ambulatory Visit: Payer: Self-pay | Admitting: *Deleted

## 2020-10-02 MED ORDER — MAGIC MOUTHWASH: 5.0000 mL | Freq: Four times a day (QID) | ORAL | 1 refills | Status: DC | PRN

## 2020-10-02 NOTE — Progress Notes (Signed)
MyChart message from patient that tongue burns. Called [facility] script in to pharmacy and patient notified. Encouraged to avoid spicy foods and carbonated beverages.

## 2020-10-05 ENCOUNTER — Other Ambulatory Visit: Payer: Self-pay | Admitting: Oncology

## 2020-10-08 ENCOUNTER — Inpatient Hospital Stay: Payer: 59

## 2020-10-08 ENCOUNTER — Encounter: Payer: Self-pay | Admitting: Nurse Practitioner

## 2020-10-08 ENCOUNTER — Inpatient Hospital Stay: Payer: 59 | Attending: Oncology | Admitting: Nurse Practitioner

## 2020-10-08 ENCOUNTER — Other Ambulatory Visit: Payer: Self-pay

## 2020-10-08 VITALS — BP 146/80 | HR 95 | Temp 97.5°F | Resp 18 | Ht 68.0 in | Wt 223.4 lb

## 2020-10-08 DIAGNOSIS — D701 Agranulocytosis secondary to cancer chemotherapy: Secondary | ICD-10-CM | POA: Insufficient documentation

## 2020-10-08 DIAGNOSIS — Z79899 Other long term (current) drug therapy: Secondary | ICD-10-CM | POA: Insufficient documentation

## 2020-10-08 DIAGNOSIS — N1330 Unspecified hydronephrosis: Secondary | ICD-10-CM | POA: Insufficient documentation

## 2020-10-08 DIAGNOSIS — C20 Malignant neoplasm of rectum: Secondary | ICD-10-CM | POA: Insufficient documentation

## 2020-10-08 DIAGNOSIS — C787 Secondary malignant neoplasm of liver and intrahepatic bile duct: Secondary | ICD-10-CM | POA: Diagnosis not present

## 2020-10-08 DIAGNOSIS — T451X5A Adverse effect of antineoplastic and immunosuppressive drugs, initial encounter: Secondary | ICD-10-CM | POA: Diagnosis not present

## 2020-10-08 DIAGNOSIS — Z5189 Encounter for other specified aftercare: Secondary | ICD-10-CM | POA: Diagnosis not present

## 2020-10-08 DIAGNOSIS — Z87891 Personal history of nicotine dependence: Secondary | ICD-10-CM | POA: Diagnosis not present

## 2020-10-08 DIAGNOSIS — Z5111 Encounter for antineoplastic chemotherapy: Secondary | ICD-10-CM | POA: Diagnosis present

## 2020-10-08 DIAGNOSIS — G2581 Restless legs syndrome: Secondary | ICD-10-CM | POA: Insufficient documentation

## 2020-10-08 DIAGNOSIS — R200 Anesthesia of skin: Secondary | ICD-10-CM | POA: Insufficient documentation

## 2020-10-08 DIAGNOSIS — Z95828 Presence of other vascular implants and grafts: Secondary | ICD-10-CM

## 2020-10-08 DIAGNOSIS — E041 Nontoxic single thyroid nodule: Secondary | ICD-10-CM | POA: Insufficient documentation

## 2020-10-08 DIAGNOSIS — G43909 Migraine, unspecified, not intractable, without status migrainosus: Secondary | ICD-10-CM | POA: Insufficient documentation

## 2020-10-08 LAB — CMP (CANCER CENTER ONLY)
ALT: 56 U/L — ABNORMAL HIGH (ref 0–44)
AST: 48 U/L — ABNORMAL HIGH (ref 15–41)
Albumin: 3.4 g/dL — ABNORMAL LOW (ref 3.5–5.0)
Alkaline Phosphatase: 109 U/L (ref 38–126)
Anion gap: 7 (ref 5–15)
BUN: 9 mg/dL (ref 6–20)
CO2: 25 mmol/L (ref 22–32)
Calcium: 9.3 mg/dL (ref 8.9–10.3)
Chloride: 108 mmol/L (ref 98–111)
Creatinine: 1.04 mg/dL — ABNORMAL HIGH (ref 0.44–1.00)
GFR, Estimated: >60 mL/min (ref >60)
Glucose, Bld: 95 mg/dL (ref 70–99)
Potassium: 3.8 mmol/L (ref 3.5–5.1)
Sodium: 140 mmol/L (ref 135–145)
Total Bilirubin: 0.8 mg/dL (ref 0.3–1.2)
Total Protein: 7.1 g/dL (ref 6.5–8.1)

## 2020-10-08 LAB — CBC WITH DIFFERENTIAL (CANCER CENTER ONLY)
Abs Immature Granulocytes: 0 10*3/uL (ref 0.00–0.07)
Basophils Absolute: 0 10*3/uL (ref 0.0–0.1)
Basophils Relative: 1 %
Eosinophils Absolute: 0.2 10*3/uL (ref 0.0–0.5)
Eosinophils Relative: 7 %
HCT: 35.3 % — ABNORMAL LOW (ref 36.0–46.0)
Hemoglobin: 11.8 g/dL — ABNORMAL LOW (ref 12.0–15.0)
Immature Granulocytes: 0 %
Lymphocytes Relative: 40 %
Lymphs Abs: 0.9 10*3/uL (ref 0.7–4.0)
MCH: 30.9 pg (ref 26.0–34.0)
MCHC: 33.4 g/dL (ref 30.0–36.0)
MCV: 92.4 fL (ref 80.0–100.0)
Monocytes Absolute: 0.4 10*3/uL (ref 0.1–1.0)
Monocytes Relative: 18 %
Neutro Abs: 0.8 10*3/uL — ABNORMAL LOW (ref 1.7–7.7)
Neutrophils Relative %: 34 %
Platelet Count: 184 10*3/uL (ref 150–400)
RBC: 3.82 MIL/uL — ABNORMAL LOW (ref 3.87–5.11)
RDW: 13.4 % (ref 11.5–15.5)
WBC Count: 2.3 10*3/uL — ABNORMAL LOW (ref 4.0–10.5)
nRBC: 0 % (ref 0.0–0.2)

## 2020-10-08 LAB — CEA (IN HOUSE-CHCC): CEA (CHCC-In House): 3.57 ng/mL (ref 0.00–5.00)

## 2020-10-08 LAB — MAGNESIUM: Magnesium: 1.6 mg/dL — ABNORMAL LOW (ref 1.7–2.4)

## 2020-10-08 MED ORDER — SODIUM CHLORIDE 0.9% FLUSH
10.0000 mL | INTRAVENOUS | Status: DC | PRN
  Administered 2020-10-08: 10 mL via INTRAVENOUS
  Filled 2020-10-08: qty 10

## 2020-10-08 NOTE — Progress Notes (Addendum)
[facility] OFFICE PROGRESS NOTE   Diagnosis: Colon cancer  INTERVAL HISTORY:   Norma Colon returns as scheduled.  She completed cycle 1 FOLFIRI/Avastin 09/23/2020.  She denies nausea/vomiting.  No diarrhea.  About a week ago lips felt swollen and tongue became very sore.  The tongue soreness has persisted with any liquids.  She tolerates solids without difficulty.  She had some dizziness on day 1, lasting a few hours.  She denies fever, cough, shortness of breath.  No bleeding.  She has had 2 episodes of a cramping sensation at the left upper abdomen.  The cramping is relieved with breathing techniques and position adjustment.  Objective:  Vital signs in last 24 hours:  Blood pressure (!) 146/80, pulse 95, temperature (!) 97.5 F (36.4 C), temperature source Tympanic, resp. rate 18, height 5' 8" (1.727 m), weight 223 lb 6.4 oz (101.3 kg), SpO2 98 %.    HEENT: No thrush or ulcers. Resp: Lungs clear bilaterally. Cardio: Regular rate and rhythm. GI: Abdomen soft and nontender.  No hepatosplenomegaly.  No mass. Vascular: No leg edema. Skin: Palms without erythema. Port-A-Cath without erythema.   Lab Results:  Lab Results  Component Value Date   WBC 2.3 (L) 10/08/2020   HGB 11.8 (L) 10/08/2020   HCT 35.3 (L) 10/08/2020   MCV 92.4 10/08/2020   PLT 184 10/08/2020   NEUTROABS 0.8 (L) 10/08/2020    Imaging:  No results found.  Medications: I have reviewed the patient's current medications.  Assessment/Plan: 1. Rectal cancer ? 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 CTs 05/24/2018-mass beginning at proximal he 7.6 cm from the anal verge, "20 "perirectal lymph nodes, no evidence of metastatic disease ? Pelvic MRI 06/01/2018-tumor measured at 10.8 cm from the anal sphincter, T3b, N1-2 left perirectal lymph nodes, each measuring 5  mm ? Radiation and Xeloda initiated 06/13/2018, completed 07/20/2018 ? Low anterior resection 09/16/2018,ypT3,ypN1c, 1 cm tumor, partial response-score 2, 0/13 lymph nodes positive, 2 tumor deposits, MSI-stable, no loss of mismatch repair protein expression ? Cycle 1 CAPOX 10/17/2018 ? Cycle 2 CAPOX 11/07/2018 (oxaliplatin dose reduced due to neutropenia) ? Cycle 3 CAPOX 11/28/2018 ? Cycle 4 CAPOX 12/19/2018 ? CTs 05/29/2019-no evidence of metastatic disease, severe right hydronephrosis, left thyroid nodule ? CTs 04/11/2020-subtle nodular lesion in the inferior right liver suspicious for a new metastasis, status post right ureteral resection and psoas hitch/flap, resolution of hydronephrosis, sacral insufficiency fractures bilaterally ? MRI liver 05/08/2020-2.1 cm segment 6 lesion suspicious for a metastasis, 4 mm suspicious segment 3 lesion 4 mm segment 4B lesion consistent with a hemangioma, no abdominal lymphadenopathy, cholelithiasis ? 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 MRI abdomen 08/13/2020-ablation defects in segment 6 and segment 3, tiny foci of restricted diffusion with arterial phase enhancement at the junctions of segment 4A and segment 4B, and segment 5 concerning for metastatic disease ? CTs 09/09/2020-stable ablation sites at segment 6 and segment 2. Small focus of hyperenhancement measuring 7 mm in the inferior right liver segment 6 anterior to the ablation site, and in the anterior right liver segment 5/8-unchanged compared to MRI and concerning for metastatic disease, no other evidence of metastatic disease. Scattered irregular groundglass opacities in the posterior right upper lobe ? Cycle 1 FOLFIRI/Avastin 09/23/2020 ? Treatment held 10/08/2020   due to neutropenia 2. History ofTobacco use 3. 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 Alyson Ingles, urology 4. Migraine headaches 5. Restless legs 6. Arm pain during the oxaliplatin infusion and for 1 week after cycle 1. The oxaliplatin will be further diluted and infusion time extended getting with cycle 2. 7. Neutropenia secondary to chemotherapy. Oxaliplatin dose reduced beginning with cycle 2. 8. 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 Right hydronephrosis on CT 05/29/2019-referred to Dr. Alyson Ingles, surgical reimplantation of the right ureter on 08/03/2019, right ureter stent removed 09/14/2019 11. Thyroid nodules-new versus enlarged left thyroid nodule CT 05/29/2019, thyroid ultrasound 06/20/2019-multiple nodules, 1 right-sided nodule met criteria for ultrasound follow-up  Ultrasound 05/08/2020-multinodular goiter, stable nodules in the isthmus and right thyroid meeting criteria for 1 year follow-up, additional stable nodules do not meet criteria for follow-up   Disposition: Norma Colon appears stable.  She has completed 1 cycle of FOLFIRI/Avastin.  She has neutropenia on labs today.  We are holding today's treatment and rescheduling for 1 week with the plan for Udenyca on the day of pump discontinuation.  We reviewed potential toxicities associated with Udenyca including bone pain, rash, splenic rupture.  She agrees with this plan.  We discussed neutropenic precautions.  She understands to contact the office with fever, chills, other signs of infection.  She will return for cycle 2 FOLFIRI/Avastin in 1 week.  We will see her in follow-up prior to treatment in 3 weeks.  She will contact the office in the interim as outlined above or with any other problems.  Plan  reviewed with Dr. Benay Spice.    Norma Colon ANP/GNP-BC   10/08/2020  12:56 PM

## 2020-10-09 ENCOUNTER — Other Ambulatory Visit: Payer: 59

## 2020-10-10 ENCOUNTER — Encounter: Payer: Self-pay | Admitting: *Deleted

## 2020-10-10 ENCOUNTER — Telehealth: Payer: Self-pay | Admitting: Nurse Practitioner

## 2020-10-10 ENCOUNTER — Ambulatory Visit: Payer: 59 | Admitting: Oncology

## 2020-10-10 NOTE — Progress Notes (Signed)
Faxed recent office notes, labs, chemo flowsheet and scan reports to Dr. Wilkie Aye at [facility] per patient request.

## 2020-10-10 NOTE — Telephone Encounter (Signed)
Scheduled appointments per 1/4 los. Spoke to patient who is aware of appointments dates and times.  

## 2020-10-14 ENCOUNTER — Inpatient Hospital Stay: Payer: 59

## 2020-10-14 ENCOUNTER — Other Ambulatory Visit: Payer: Self-pay

## 2020-10-14 VITALS — BP 144/92 | HR 93 | Temp 98.2°F | Resp 18

## 2020-10-14 DIAGNOSIS — Z95828 Presence of other vascular implants and grafts: Secondary | ICD-10-CM

## 2020-10-14 DIAGNOSIS — C20 Malignant neoplasm of rectum: Secondary | ICD-10-CM

## 2020-10-14 DIAGNOSIS — Z5111 Encounter for antineoplastic chemotherapy: Secondary | ICD-10-CM | POA: Diagnosis not present

## 2020-10-14 DIAGNOSIS — C787 Secondary malignant neoplasm of liver and intrahepatic bile duct: Secondary | ICD-10-CM

## 2020-10-14 LAB — CBC WITH DIFFERENTIAL (CANCER CENTER ONLY)
Abs Immature Granulocytes: 0.03 10*3/uL (ref 0.00–0.07)
Basophils Absolute: 0 10*3/uL (ref 0.0–0.1)
Basophils Relative: 1 %
Eosinophils Absolute: 0.2 10*3/uL (ref 0.0–0.5)
Eosinophils Relative: 4 %
HCT: 38.2 % (ref 36.0–46.0)
Hemoglobin: 12.8 g/dL (ref 12.0–15.0)
Immature Granulocytes: 1 %
Lymphocytes Relative: 25 %
Lymphs Abs: 1.1 10*3/uL (ref 0.7–4.0)
MCH: 31.5 pg (ref 26.0–34.0)
MCHC: 33.5 g/dL (ref 30.0–36.0)
MCV: 94.1 fL (ref 80.0–100.0)
Monocytes Absolute: 0.7 10*3/uL (ref 0.1–1.0)
Monocytes Relative: 16 %
Neutro Abs: 2.4 10*3/uL (ref 1.7–7.7)
Neutrophils Relative %: 53 %
Platelet Count: 256 10*3/uL (ref 150–400)
RBC: 4.06 MIL/uL (ref 3.87–5.11)
RDW: 14.2 % (ref 11.5–15.5)
WBC Count: 4.5 10*3/uL (ref 4.0–10.5)
nRBC: 0 % (ref 0.0–0.2)

## 2020-10-14 LAB — CMP (CANCER CENTER ONLY)
ALT: 56 U/L — ABNORMAL HIGH (ref 0–44)
AST: 54 U/L — ABNORMAL HIGH (ref 15–41)
Albumin: 3.8 g/dL (ref 3.5–5.0)
Alkaline Phosphatase: 119 U/L (ref 38–126)
Anion gap: 10 (ref 5–15)
BUN: 16 mg/dL (ref 6–20)
CO2: 24 mmol/L (ref 22–32)
Calcium: 9.9 mg/dL (ref 8.9–10.3)
Chloride: 107 mmol/L (ref 98–111)
Creatinine: 1.07 mg/dL — ABNORMAL HIGH (ref 0.44–1.00)
GFR, Estimated: >60 mL/min (ref >60)
Glucose, Bld: 109 mg/dL — ABNORMAL HIGH (ref 70–99)
Potassium: 4.4 mmol/L (ref 3.5–5.1)
Sodium: 141 mmol/L (ref 135–145)
Total Bilirubin: 0.6 mg/dL (ref 0.3–1.2)
Total Protein: 7.8 g/dL (ref 6.5–8.1)

## 2020-10-14 LAB — TOTAL PROTEIN, URINE DIPSTICK: Protein, ur: NEGATIVE mg/dL

## 2020-10-14 MED ORDER — SODIUM CHLORIDE 0.9 % IV SOLN
Freq: Once | INTRAVENOUS | Status: DC
  Filled 2020-10-14: qty 250

## 2020-10-14 MED ORDER — HEPARIN SOD (PORK) LOCK FLUSH 100 UNIT/ML IV SOLN
500.0000 [IU] | Freq: Once | INTRAVENOUS | Status: DC | PRN
  Filled 2020-10-14: qty 5

## 2020-10-14 MED ORDER — SODIUM CHLORIDE 0.9 % IV SOLN
Freq: Once | INTRAVENOUS | Status: AC
  Filled 2020-10-14: qty 250

## 2020-10-14 MED ORDER — SODIUM CHLORIDE 0.9 % IV SOLN
2400.0000 mg/m2 | INTRAVENOUS | Status: DC
  Administered 2020-10-14: 5300 mg via INTRAVENOUS
  Filled 2020-10-14: qty 106

## 2020-10-14 MED ORDER — ATROPINE SULFATE 1 MG/ML IJ SOLN: 0.5000 mg | Freq: Once | INTRAMUSCULAR | Status: DC | PRN

## 2020-10-14 MED ORDER — SODIUM CHLORIDE 0.9 % IV SOLN
10.0000 mg | Freq: Once | INTRAVENOUS | Status: AC
  Administered 2020-10-14: 10 mg via INTRAVENOUS
  Filled 2020-10-14: qty 10
  Filled 2020-10-14: qty 1

## 2020-10-14 MED ORDER — SODIUM CHLORIDE 0.9 % IV SOLN
400.0000 mg/m2 | Freq: Once | INTRAVENOUS | Status: AC
  Administered 2020-10-14: 880 mg via INTRAVENOUS
  Filled 2020-10-14: qty 44

## 2020-10-14 MED ORDER — SODIUM CHLORIDE 0.9% FLUSH
10.0000 mL | INTRAVENOUS | Status: DC | PRN
  Administered 2020-10-14: 10 mL via INTRAVENOUS
  Filled 2020-10-14: qty 10

## 2020-10-14 MED ORDER — SODIUM CHLORIDE 0.9 % IV SOLN
180.0000 mg/m2 | Freq: Once | INTRAVENOUS | Status: AC
  Administered 2020-10-14: 400 mg via INTRAVENOUS
  Filled 2020-10-14: qty 15

## 2020-10-14 MED ORDER — PALONOSETRON HCL INJECTION 0.25 MG/5ML
0.2500 mg | Freq: Once | INTRAVENOUS | Status: AC
  Administered 2020-10-14: 0.25 mg via INTRAVENOUS

## 2020-10-14 MED ORDER — SODIUM CHLORIDE 0.9 % IV SOLN
5.0000 mg/kg | Freq: Once | INTRAVENOUS | Status: AC
  Administered 2020-10-14: 500 mg via INTRAVENOUS
  Filled 2020-10-14: qty 16

## 2020-10-14 MED ORDER — SODIUM CHLORIDE 0.9% FLUSH
10.0000 mL | INTRAVENOUS | Status: DC | PRN
  Filled 2020-10-14: qty 10

## 2020-10-14 MED ORDER — PALONOSETRON HCL INJECTION 0.25 MG/5ML
INTRAVENOUS | Status: AC
  Filled 2020-10-14: qty 5

## 2020-10-14 MED ORDER — FLUOROURACIL CHEMO INJECTION 2.5 GM/50ML
400.0000 mg/m2 | Freq: Once | INTRAVENOUS | Status: AC
  Administered 2020-10-14: 900 mg via INTRAVENOUS
  Filled 2020-10-14: qty 18

## 2020-10-14 NOTE — Patient Instructions (Signed)
[facility] Discharge Instructions for Patients Receiving Chemotherapy  Today you received the following chemotherapy agents: Bevacizumab (Avastin), Irinotecan, Leucovorin, and Fluorouracil (5FU)  To help prevent nausea and vomiting after your treatment, we encourage you to take your nausea medication as prescribed.  DO NOT TAKE ZOFRAN (ONDANSTERON) FOR 3 DAYS AFTER TREATMENT!  If you develop nausea and vomiting that is not controlled by your nausea medication, call the clinic.   BELOW ARE SYMPTOMS THAT SHOULD BE REPORTED IMMEDIATELY:  *FEVER GREATER THAN 100.5 F  *CHILLS WITH OR WITHOUT FEVER  NAUSEA AND VOMITING THAT IS NOT CONTROLLED WITH YOUR NAUSEA MEDICATION  *UNUSUAL SHORTNESS OF BREATH  *UNUSUAL BRUISING OR BLEEDING  TENDERNESS IN MOUTH AND THROAT WITH OR WITHOUT PRESENCE OF ULCERS  *URINARY PROBLEMS  *BOWEL PROBLEMS  UNUSUAL RASH Items with * indicate a potential emergency and should be followed up as soon as possible.  Feel free to call the clinic should you have any questions or concerns. The clinic phone number is [phone].  Please show the CHEMO ALERT CARD at check-in to the Emergency Department and triage nurse.     Bevacizumab injection What is this medicine? BEVACIZUMAB (be va SIZ yoo mab) is a monoclonal antibody. It is used to treat many types of cancer. This medicine may be used for other purposes; ask your health care provider or pharmacist if you have questions. COMMON BRAND NAME(S): Avastin, MVASI, Zirabev What should I tell my health care provider before I take this medicine? They need to know if you have any of these conditions:  diabetes  heart disease  high blood pressure  history of coughing up blood  prior anthracycline chemotherapy (e.g., doxorubicin, daunorubicin, epirubicin)  recent or ongoing radiation therapy  recent or planning to have surgery  stroke  an unusual or allergic reaction to bevacizumab,  hamster proteins, mouse proteins, other medicines, foods, dyes, or preservatives  pregnant or trying to get pregnant  breast-feeding How should I use this medicine? This medicine is for infusion into a vein. It is given by a health care professional in a hospital or clinic setting. Talk to your pediatrician regarding the use of this medicine in children. Special care may be needed. Overdosage: If you think you have taken too much of this medicine contact a poison control center or emergency room at once. NOTE: This medicine is only for you. Do not share this medicine with others. What if I miss a dose? It is important not to miss your dose. Call your doctor or health care professional if you are unable to keep an appointment. What may interact with this medicine? Interactions are not expected. This list may not describe all possible interactions. Give your health care provider a list of all the medicines, herbs, non-prescription drugs, or dietary supplements you use. Also tell them if you smoke, drink alcohol, or use illegal drugs. Some items may interact with your medicine. What should I watch for while using this medicine? Your condition will be monitored carefully while you are receiving this medicine. You will need important blood work and urine testing done while you are taking this medicine. This medicine may increase your risk to bruise or bleed. Call your doctor or health care professional if you notice any unusual bleeding. Before having surgery, talk to your health care provider to make sure it is ok. This drug can increase the risk of poor healing of your surgical site or wound. You will need to stop this drug for   28 days before surgery. After surgery, wait at least 28 days before restarting this drug. Make sure the surgical site or wound is healed enough before restarting this drug. Talk to your health care provider if questions. Do not become pregnant while taking this medicine or for  6 months after stopping it. Women should inform their doctor if they wish to become pregnant or think they might be pregnant. There is a potential for serious side effects to an unborn child. Talk to your health care professional or pharmacist for more information. Do not breast-feed an infant while taking this medicine and for 6 months after the last dose. This medicine has caused ovarian failure in some women. This medicine may interfere with the ability to have a child. You should talk to your doctor or health care professional if you are concerned about your fertility. What side effects may I notice from receiving this medicine? Side effects that you should report to your doctor or health care professional as soon as possible:  allergic reactions like skin rash, itching or hives, swelling of the face, lips, or tongue  chest pain or chest tightness  chills  coughing up blood  high fever  seizures  severe constipation  signs and symptoms of bleeding such as bloody or black, tarry stools; red or dark-brown urine; spitting up blood or brown material that looks like coffee grounds; red spots on the skin; unusual bruising or bleeding from the eye, gums, or nose  signs and symptoms of a blood clot such as breathing problems; chest pain; severe, sudden headache; pain, swelling, warmth in the leg  signs and symptoms of a stroke like changes in vision; confusion; trouble speaking or understanding; severe headaches; sudden numbness or weakness of the face, arm or leg; trouble walking; dizziness; loss of balance or coordination  stomach pain  sweating  swelling of legs or ankles  vomiting  weight gain Side effects that usually do not require medical attention (report to your doctor or health care professional if they continue or are bothersome):  back pain  changes in taste  decreased appetite  dry skin  nausea  tiredness This list may not describe all possible side effects.  Call your doctor for medical advice about side effects. You may report side effects to FDA at 1-800-FDA-1088. Where should I keep my medicine? This drug is given in a hospital or clinic and will not be stored at home. NOTE: This sheet is a summary. It may not cover all possible information. If you have questions about this medicine, talk to your doctor, pharmacist, or health care provider.  2020 Elsevier/Gold Standard (2019-07-19 10:50:46)    Leucovorin injection What is this medicine? LEUCOVORIN (loo koe VOR in) is used to prevent or treat the harmful effects of some medicines. This medicine is used to treat anemia caused by a low amount of folic acid in the body. It is also used with 5-fluorouracil (5-FU) to treat colon cancer. This medicine may be used for other purposes; ask your health care provider or pharmacist if you have questions. What should I tell my health care provider before I take this medicine? They need to know if you have any of these conditions:  anemia from low levels of vitamin B-12 in the blood  an unusual or allergic reaction to leucovorin, folic acid, other medicines, foods, dyes, or preservatives  pregnant or trying to get pregnant  breast-feeding How should I use this medicine? This medicine is for injection into a   muscle or into a vein. It is given by a health care professional in a hospital or clinic setting. Talk to your pediatrician regarding the use of this medicine in children. Special care may be needed. Overdosage: If you think you have taken too much of this medicine contact a poison control center or emergency room at once. NOTE: This medicine is only for you. Do not share this medicine with others. What if I miss a dose? This does not apply. What may interact with this medicine?  capecitabine  fluorouracil  phenobarbital  phenytoin  primidone  trimethoprim-sulfamethoxazole This list may not describe all possible interactions. Give your  health care provider a list of all the medicines, herbs, non-prescription drugs, or dietary supplements you use. Also tell them if you smoke, drink alcohol, or use illegal drugs. Some items may interact with your medicine. What should I watch for while using this medicine? Your condition will be monitored carefully while you are receiving this medicine. This medicine may increase the side effects of 5-fluorouracil, 5-FU. Tell your doctor or health care professional if you have diarrhea or mouth sores that do not get better or that get worse. What side effects may I notice from receiving this medicine? Side effects that you should report to your doctor or health care professional as soon as possible:  allergic reactions like skin rash, itching or hives, swelling of the face, lips, or tongue  breathing problems  fever, infection  mouth sores  unusual bleeding or bruising  unusually weak or tired Side effects that usually do not require medical attention (report to your doctor or health care professional if they continue or are bothersome):  constipation or diarrhea  loss of appetite  nausea, vomiting This list may not describe all possible side effects. Call your doctor for medical advice about side effects. You may report side effects to FDA at 1-800-FDA-1088. Where should I keep my medicine? This drug is given in a hospital or clinic and will not be stored at home. NOTE: This sheet is a summary. It may not cover all possible information. If you have questions about this medicine, talk to your doctor, pharmacist, or health care provider.  2020 Elsevier/Gold Standard (2008-03-27 16:50:29)   Irinotecan injection What is this medicine? IRINOTECAN (ir in oh TEE kan ) is a chemotherapy drug. It is used to treat colon and rectal cancer. This medicine may be used for other purposes; ask your health care provider or pharmacist if you have questions. COMMON BRAND NAME(S): Camptosar What  should I tell my health care provider before I take this medicine? They need to know if you have any of these conditions:  dehydration  diarrhea  infection (especially a virus infection such as chickenpox, cold sores, or herpes)  liver disease  low blood counts, like low white cell, platelet, or red cell counts  low levels of calcium, magnesium, or potassium in the blood  recent or ongoing radiation therapy  an unusual or allergic reaction to irinotecan, other medicines, foods, dyes, or preservatives  pregnant or trying to get pregnant  breast-feeding How should I use this medicine? This drug is given as an infusion into a vein. It is administered in a hospital or clinic by a specially trained health care professional. Talk to your pediatrician regarding the use of this medicine in children. Special care may be needed. Overdosage: If you think you have taken too much of this medicine contact a poison control center or emergency room at once.   NOTE: This medicine is only for you. Do not share this medicine with others. What if I miss a dose? It is important not to miss your dose. Call your doctor or health care professional if you are unable to keep an appointment. What may interact with this medicine? This medicine may interact with the following medications:  antiviral medicines for HIV or AIDS  certain antibiotics like rifampin or rifabutin  certain medicines for fungal infections like itraconazole, ketoconazole, posaconazole, and voriconazole  certain medicines for seizures like carbamazepine, phenobarbital, phenotoin  clarithromycin  gemfibrozil  nefazodone  St. John's Wort This list may not describe all possible interactions. Give your health care provider a list of all the medicines, herbs, non-prescription drugs, or dietary supplements you use. Also tell them if you smoke, drink alcohol, or use illegal drugs. Some items may interact with your medicine. What  should I watch for while using this medicine? Your condition will be monitored carefully while you are receiving this medicine. You will need important blood work done while you are taking this medicine. This drug may make you feel generally unwell. This is not uncommon, as chemotherapy can affect healthy cells as well as cancer cells. Report any side effects. Continue your course of treatment even though you feel ill unless your doctor tells you to stop. In some cases, you may be given additional medicines to help with side effects. Follow all directions for their use. You may get drowsy or dizzy. Do not drive, use machinery, or do anything that needs mental alertness until you know how this medicine affects you. Do not stand or sit up quickly, especially if you are an older patient. This reduces the risk of dizzy or fainting spells. Call your health care professional for advice if you get a fever, chills, or sore throat, or other symptoms of a cold or flu. Do not treat yourself. This medicine decreases your body's ability to fight infections. Try to avoid being around people who are sick. Avoid taking products that contain aspirin, acetaminophen, ibuprofen, naproxen, or ketoprofen unless instructed by your doctor. These medicines may hide a fever. This medicine may increase your risk to bruise or bleed. Call your doctor or health care professional if you notice any unusual bleeding. Be careful brushing and flossing your teeth or using a toothpick because you may get an infection or bleed more easily. If you have any dental work done, tell your dentist you are receiving this medicine. Do not become pregnant while taking this medicine or for 6 months after stopping it. Women should inform their health care professional if they wish to become pregnant or think they might be pregnant. Men should not father a child while taking this medicine and for 3 months after stopping it. There is potential for serious  side effects to an unborn child. Talk to your health care professional for more information. Do not breast-feed an infant while taking this medicine or for 7 days after stopping it. This medicine has caused ovarian failure in some women. This medicine may make it more difficult to get pregnant. Talk to your health care professional if you are concerned about your fertility. This medicine has caused decreased sperm counts in some men. This may make it more difficult to father a child. Talk to your health care professional if you are concerned about your fertility. What side effects may I notice from receiving this medicine? Side effects that you should report to your doctor or health care professional as   soon as possible:  allergic reactions like skin rash, itching or hives, swelling of the face, lips, or tongue  chest pain  diarrhea  flushing, runny nose, sweating during infusion  low blood counts - this medicine may decrease the number of white blood cells, red blood cells and platelets. You may be at increased risk for infections and bleeding.  nausea, vomiting  pain, swelling, warmth in the leg  signs of decreased platelets or bleeding - bruising, pinpoint red spots on the skin, black, tarry stools, blood in the urine  signs of infection - fever or chills, cough, sore throat, pain or difficulty passing urine  signs of decreased red blood cells - unusually weak or tired, fainting spells, lightheadedness Side effects that usually do not require medical attention (report to your doctor or health care professional if they continue or are bothersome):  constipation  hair loss  headache  loss of appetite  mouth sores  stomach pain This list may not describe all possible side effects. Call your doctor for medical advice about side effects. You may report side effects to FDA at 1-800-FDA-1088. Where should I keep my medicine? This drug is given in a hospital or clinic and will not  be stored at home. NOTE: This sheet is a summary. It may not cover all possible information. If you have questions about this medicine, talk to your doctor, pharmacist, or health care provider.  2020 Elsevier/Gold Standard (2018-11-11 10:09:17)   Fluorouracil, 5-FU injection What is this medicine? FLUOROURACIL, 5-FU (flure oh YOOR a sil) is a chemotherapy drug. It slows the growth of cancer cells. This medicine is used to treat many types of cancer like breast cancer, colon or rectal cancer, pancreatic cancer, and stomach cancer. This medicine may be used for other purposes; ask your health care provider or pharmacist if you have questions. COMMON BRAND NAME(S): Adrucil What should I tell my health care provider before I take this medicine? They need to know if you have any of these conditions:  blood disorders  dihydropyrimidine dehydrogenase (DPD) deficiency  infection (especially a virus infection such as chickenpox, cold sores, or herpes)  kidney disease  liver disease  malnourished, poor nutrition  recent or ongoing radiation therapy  an unusual or allergic reaction to fluorouracil, other chemotherapy, other medicines, foods, dyes, or preservatives  pregnant or trying to get pregnant  breast-feeding How should I use this medicine? This drug is given as an infusion or injection into a vein. It is administered in a hospital or clinic by a specially trained health care professional. Talk to your pediatrician regarding the use of this medicine in children. Special care may be needed. Overdosage: If you think you have taken too much of this medicine contact a poison control center or emergency room at once. NOTE: This medicine is only for you. Do not share this medicine with others. What if I miss a dose? It is important not to miss your dose. Call your doctor or health care professional if you are unable to keep an appointment. What may interact with this  medicine?  allopurinol  cimetidine  dapsone  digoxin  hydroxyurea  leucovorin  levamisole  medicines for seizures like ethotoin, fosphenytoin, phenytoin  medicines to increase blood counts like filgrastim, pegfilgrastim, sargramostim  medicines that treat or prevent blood clots like warfarin, enoxaparin, and dalteparin  methotrexate  metronidazole  pyrimethamine  some other chemotherapy drugs like busulfan, cisplatin, estramustine, vinblastine  trimethoprim  trimetrexate  vaccines Talk to your doctor   or health care professional before taking any of these medicines:  acetaminophen  aspirin  ibuprofen  ketoprofen  naproxen This list may not describe all possible interactions. Give your health care provider a list of all the medicines, herbs, non-prescription drugs, or dietary supplements you use. Also tell them if you smoke, drink alcohol, or use illegal drugs. Some items may interact with your medicine. What should I watch for while using this medicine? Visit your doctor for checks on your progress. This drug may make you feel generally unwell. This is not uncommon, as chemotherapy can affect healthy cells as well as cancer cells. Report any side effects. Continue your course of treatment even though you feel ill unless your doctor tells you to stop. In some cases, you may be given additional medicines to help with side effects. Follow all directions for their use. Call your doctor or health care professional for advice if you get a fever, chills or sore throat, or other symptoms of a cold or flu. Do not treat yourself. This drug decreases your body's ability to fight infections. Try to avoid being around people who are sick. This medicine may increase your risk to bruise or bleed. Call your doctor or health care professional if you notice any unusual bleeding. Be careful brushing and flossing your teeth or using a toothpick because you may get an infection or bleed  more easily. If you have any dental work done, tell your dentist you are receiving this medicine. Avoid taking products that contain aspirin, acetaminophen, ibuprofen, naproxen, or ketoprofen unless instructed by your doctor. These medicines may hide a fever. Do not become pregnant while taking this medicine. Women should inform their doctor if they wish to become pregnant or think they might be pregnant. There is a potential for serious side effects to an unborn child. Talk to your health care professional or pharmacist for more information. Do not breast-feed an infant while taking this medicine. Men should inform their doctor if they wish to father a child. This medicine may lower sperm counts. Do not treat diarrhea with over the counter products. Contact your doctor if you have diarrhea that lasts more than 2 days or if it is severe and watery. This medicine can make you more sensitive to the sun. Keep out of the sun. If you cannot avoid being in the sun, wear protective clothing and use sunscreen. Do not use sun lamps or tanning beds/booths. What side effects may I notice from receiving this medicine? Side effects that you should report to your doctor or health care professional as soon as possible:  allergic reactions like skin rash, itching or hives, swelling of the face, lips, or tongue  low blood counts - this medicine may decrease the number of white blood cells, red blood cells and platelets. You may be at increased risk for infections and bleeding.  signs of infection - fever or chills, cough, sore throat, pain or difficulty passing urine  signs of decreased platelets or bleeding - bruising, pinpoint red spots on the skin, black, tarry stools, blood in the urine  signs of decreased red blood cells - unusually weak or tired, fainting spells, lightheadedness  breathing problems  changes in vision  chest pain  mouth sores  nausea and vomiting  pain, swelling, redness at site  where injected  pain, tingling, numbness in the hands or feet  redness, swelling, or sores on hands or feet  stomach pain  unusual bleeding Side effects that usually do not   require medical attention (report to your doctor or health care professional if they continue or are bothersome):  changes in finger or toe nails  diarrhea  dry or itchy skin  hair loss  headache  loss of appetite  sensitivity of eyes to the light  stomach upset  unusually teary eyes This list may not describe all possible side effects. Call your doctor for medical advice about side effects. You may report side effects to FDA at 1-800-FDA-1088. Where should I keep my medicine? This drug is given in a hospital or clinic and will not be stored at home. NOTE: This sheet is a summary. It may not cover all possible information. If you have questions about this medicine, talk to your doctor, pharmacist, or health care provider.  2020 Elsevier/Gold Standard (2008-01-25 13:53:16)  

## 2020-10-15 ENCOUNTER — Inpatient Hospital Stay: Payer: 59

## 2020-10-15 NOTE — Progress Notes (Signed)
Pt arrived with dressing lifting and antimicrobial disc missing. Port flushed, blood return noted, no chemo leaking. Dressing changed, new antimicrobial disc placed ad dressing reinforced. Home infusion of 55fu resumed.

## 2020-10-16 ENCOUNTER — Inpatient Hospital Stay: Payer: 59

## 2020-10-16 ENCOUNTER — Other Ambulatory Visit: Payer: Self-pay

## 2020-10-16 VITALS — BP 141/84 | HR 88 | Resp 19

## 2020-10-16 DIAGNOSIS — C20 Malignant neoplasm of rectum: Secondary | ICD-10-CM

## 2020-10-16 DIAGNOSIS — Z5111 Encounter for antineoplastic chemotherapy: Secondary | ICD-10-CM | POA: Diagnosis not present

## 2020-10-16 MED ORDER — PEGFILGRASTIM-CBQV 6 MG/0.6ML ~~LOC~~ SOSY
6.0000 mg | PREFILLED_SYRINGE | Freq: Once | SUBCUTANEOUS | Status: AC
  Administered 2020-10-16: 6 mg via SUBCUTANEOUS

## 2020-10-16 MED ORDER — PEGFILGRASTIM-CBQV 6 MG/0.6ML ~~LOC~~ SOSY
PREFILLED_SYRINGE | SUBCUTANEOUS | Status: AC
  Filled 2020-10-16: qty 0.6

## 2020-10-16 MED ORDER — SODIUM CHLORIDE 0.9% FLUSH
10.0000 mL | INTRAVENOUS | Status: DC | PRN
  Administered 2020-10-16: 10 mL
  Filled 2020-10-16: qty 10

## 2020-10-16 MED ORDER — HEPARIN SOD (PORK) LOCK FLUSH 100 UNIT/ML IV SOLN
500.0000 [IU] | Freq: Once | INTRAVENOUS | Status: AC | PRN
  Administered 2020-10-16: 500 [IU]
  Filled 2020-10-16: qty 5

## 2020-10-16 NOTE — Patient Instructions (Signed)
Pegfilgrastim injection What is this medicine? PEGFILGRASTIM (PEG fil gra stim) is a long-acting granulocyte colony-stimulating factor that stimulates the growth of neutrophils, a type of white blood cell important in the body's fight against infection. It is used to reduce the incidence of fever and infection in patients with certain types of cancer who are receiving chemotherapy that affects the bone marrow, and to increase survival after being exposed to high doses of radiation. This medicine may be used for other purposes; ask your health care provider or pharmacist if you have questions. COMMON BRAND NAME(S): Fulphila, Neulasta, Nyvepria, UDENYCA, Ziextenzo What should I tell my health care provider before I take this medicine? They need to know if you have any of these conditions:  kidney disease  latex allergy  ongoing radiation therapy  sickle cell disease  skin reactions to acrylic adhesives (On-Body Injector only)  an unusual or allergic reaction to pegfilgrastim, filgrastim, other medicines, foods, dyes, or preservatives  pregnant or trying to get pregnant  breast-feeding How should I use this medicine? This medicine is for injection under the skin. If you get this medicine at home, you will be taught how to prepare and give the pre-filled syringe or how to use the On-body Injector. Refer to the patient Instructions for Use for detailed instructions. Use exactly as directed. Tell your healthcare provider immediately if you suspect that the On-body Injector may not have performed as intended or if you suspect the use of the On-body Injector resulted in a missed or partial dose. It is important that you put your used needles and syringes in a special sharps container. Do not put them in a trash can. If you do not have a sharps container, call your pharmacist or healthcare provider to get one. Talk to your pediatrician regarding the use of this medicine in children. While this drug  may be prescribed for selected conditions, precautions do apply. Overdosage: If you think you have taken too much of this medicine contact a poison control center or emergency room at once. NOTE: This medicine is only for you. Do not share this medicine with others. What if I miss a dose? It is important not to miss your dose. Call your doctor or health care professional if you miss your dose. If you miss a dose due to an On-body Injector failure or leakage, a new dose should be administered as soon as possible using a single prefilled syringe for manual use. What may interact with this medicine? Interactions have not been studied. This list may not describe all possible interactions. Give your health care provider a list of all the medicines, herbs, non-prescription drugs, or dietary supplements you use. Also tell them if you smoke, drink alcohol, or use illegal drugs. Some items may interact with your medicine. What should I watch for while using this medicine? Your condition will be monitored carefully while you are receiving this medicine. You may need blood work done while you are taking this medicine. Talk to your health care provider about your risk of cancer. You may be more at risk for certain types of cancer if you take this medicine. If you are going to need a MRI, CT scan, or other procedure, tell your doctor that you are using this medicine (On-Body Injector only). What side effects may I notice from receiving this medicine? Side effects that you should report to your doctor or health care professional as soon as possible:  allergic reactions (skin rash, itching or hives, swelling of   the face, lips, or tongue)  back pain  dizziness  fever  pain, redness, or irritation at site where injected  pinpoint red spots on the skin  red or dark-brown urine  shortness of breath or breathing problems  stomach or side pain, or pain at the shoulder  swelling  tiredness  trouble  passing urine or change in the amount of urine  unusual bruising or bleeding Side effects that usually do not require medical attention (report to your doctor or health care professional if they continue or are bothersome):  bone pain  muscle pain This list may not describe all possible side effects. Call your doctor for medical advice about side effects. You may report side effects to FDA at 1-800-FDA-1088. Where should I keep my medicine? Keep out of the reach of children. If you are using this medicine at home, you will be instructed on how to store it. Throw away any unused medicine after the expiration date on the label. NOTE: This sheet is a summary. It may not cover all possible information. If you have questions about this medicine, talk to your doctor, pharmacist, or health care provider.  2021 [employer]vier/Gold Standard (2019-10-13 13:20:51)  

## 2020-10-27 ENCOUNTER — Other Ambulatory Visit: Payer: Self-pay | Admitting: Oncology

## 2020-10-30 ENCOUNTER — Encounter: Payer: Self-pay | Admitting: Nurse Practitioner

## 2020-10-30 ENCOUNTER — Inpatient Hospital Stay: Payer: 59

## 2020-10-30 ENCOUNTER — Other Ambulatory Visit: Payer: Self-pay | Admitting: Oncology

## 2020-10-30 ENCOUNTER — Other Ambulatory Visit: Payer: Self-pay

## 2020-10-30 ENCOUNTER — Inpatient Hospital Stay (HOSPITAL_BASED_OUTPATIENT_CLINIC_OR_DEPARTMENT_OTHER): Payer: 59 | Admitting: Nurse Practitioner

## 2020-10-30 VITALS — BP 121/78 | HR 96 | Temp 98.2°F | Resp 15 | Ht 68.0 in | Wt 216.5 lb

## 2020-10-30 DIAGNOSIS — C20 Malignant neoplasm of rectum: Secondary | ICD-10-CM | POA: Diagnosis not present

## 2020-10-30 DIAGNOSIS — Z5111 Encounter for antineoplastic chemotherapy: Secondary | ICD-10-CM | POA: Diagnosis not present

## 2020-10-30 DIAGNOSIS — C787 Secondary malignant neoplasm of liver and intrahepatic bile duct: Secondary | ICD-10-CM

## 2020-10-30 DIAGNOSIS — Z95828 Presence of other vascular implants and grafts: Secondary | ICD-10-CM

## 2020-10-30 LAB — CBC WITH DIFFERENTIAL (CANCER CENTER ONLY)
Abs Immature Granulocytes: 0.17 10*3/uL — ABNORMAL HIGH (ref 0.00–0.07)
Basophils Absolute: 0.1 10*3/uL (ref 0.0–0.1)
Basophils Relative: 1 %
Eosinophils Absolute: 0.2 10*3/uL (ref 0.0–0.5)
Eosinophils Relative: 2 %
HCT: 37.6 % (ref 36.0–46.0)
Hemoglobin: 12.4 g/dL (ref 12.0–15.0)
Immature Granulocytes: 3 %
Lymphocytes Relative: 18 %
Lymphs Abs: 1.2 10*3/uL (ref 0.7–4.0)
MCH: 31.6 pg (ref 26.0–34.0)
MCHC: 33 g/dL (ref 30.0–36.0)
MCV: 95.7 fL (ref 80.0–100.0)
Monocytes Absolute: 0.6 10*3/uL (ref 0.1–1.0)
Monocytes Relative: 9 %
Neutro Abs: 4.5 10*3/uL (ref 1.7–7.7)
Neutrophils Relative %: 67 %
Platelet Count: 179 10*3/uL (ref 150–400)
RBC: 3.93 MIL/uL (ref 3.87–5.11)
RDW: 15 % (ref 11.5–15.5)
WBC Count: 6.7 10*3/uL (ref 4.0–10.5)
nRBC: 0 % (ref 0.0–0.2)

## 2020-10-30 LAB — CMP (CANCER CENTER ONLY)
ALT: 51 U/L — ABNORMAL HIGH (ref 0–44)
AST: 38 U/L (ref 15–41)
Albumin: 3.7 g/dL (ref 3.5–5.0)
Alkaline Phosphatase: 135 U/L — ABNORMAL HIGH (ref 38–126)
Anion gap: 11 (ref 5–15)
BUN: 16 mg/dL (ref 6–20)
CO2: 24 mmol/L (ref 22–32)
Calcium: 9.1 mg/dL (ref 8.9–10.3)
Chloride: 107 mmol/L (ref 98–111)
Creatinine: 1.34 mg/dL — ABNORMAL HIGH (ref 0.44–1.00)
GFR, Estimated: 46 mL/min — ABNORMAL LOW (ref >60)
Glucose, Bld: 140 mg/dL — ABNORMAL HIGH (ref 70–99)
Potassium: 3.9 mmol/L (ref 3.5–5.1)
Sodium: 142 mmol/L (ref 135–145)
Total Bilirubin: 0.5 mg/dL (ref 0.3–1.2)
Total Protein: 7.4 g/dL (ref 6.5–8.1)

## 2020-10-30 MED ORDER — ATROPINE SULFATE 1 MG/ML IJ SOLN
INTRAMUSCULAR | Status: AC
  Filled 2020-10-30: qty 1

## 2020-10-30 MED ORDER — SODIUM CHLORIDE 0.9 % IV SOLN
5.0000 mg/kg | Freq: Once | INTRAVENOUS | Status: AC
  Administered 2020-10-30: 500 mg via INTRAVENOUS
  Filled 2020-10-30: qty 16

## 2020-10-30 MED ORDER — MAGIC MOUTHWASH: 5.0000 mL | Freq: Four times a day (QID) | ORAL | 1 refills | Status: DC | PRN

## 2020-10-30 MED ORDER — SODIUM CHLORIDE 0.9 % IV SOLN
Freq: Once | INTRAVENOUS | Status: AC
  Filled 2020-10-30: qty 250

## 2020-10-30 MED ORDER — SODIUM CHLORIDE 0.9 % IV SOLN
400.0000 mg/m2 | Freq: Once | INTRAVENOUS | Status: AC
  Administered 2020-10-30: 880 mg via INTRAVENOUS
  Filled 2020-10-30: qty 44

## 2020-10-30 MED ORDER — SODIUM CHLORIDE 0.9 % IV SOLN
180.0000 mg/m2 | Freq: Once | INTRAVENOUS | Status: AC
  Administered 2020-10-30: 400 mg via INTRAVENOUS
  Filled 2020-10-30: qty 15

## 2020-10-30 MED ORDER — FLUOROURACIL CHEMO INJECTION 2.5 GM/50ML
400.0000 mg/m2 | Freq: Once | INTRAVENOUS | Status: AC
  Administered 2020-10-30: 900 mg via INTRAVENOUS
  Filled 2020-10-30: qty 18

## 2020-10-30 MED ORDER — PALONOSETRON HCL INJECTION 0.25 MG/5ML
INTRAVENOUS | Status: AC
  Filled 2020-10-30: qty 5

## 2020-10-30 MED ORDER — SODIUM CHLORIDE 0.9 % IV SOLN
2400.0000 mg/m2 | INTRAVENOUS | Status: DC
  Administered 2020-10-30: 5300 mg via INTRAVENOUS
  Filled 2020-10-30: qty 106

## 2020-10-30 MED ORDER — SODIUM CHLORIDE 0.9 % IV SOLN
10.0000 mg | Freq: Once | INTRAVENOUS | Status: AC
  Administered 2020-10-30: 10 mg via INTRAVENOUS
  Filled 2020-10-30: qty 10

## 2020-10-30 MED ORDER — ATROPINE SULFATE 1 MG/ML IJ SOLN: 0.5000 mg | Freq: Once | INTRAMUSCULAR | Status: DC | PRN

## 2020-10-30 MED ORDER — PALONOSETRON HCL INJECTION 0.25 MG/5ML
0.2500 mg | Freq: Once | INTRAVENOUS | Status: AC
  Administered 2020-10-30: 0.25 mg via INTRAVENOUS

## 2020-10-30 MED ORDER — SODIUM CHLORIDE 0.9% FLUSH
10.0000 mL | INTRAVENOUS | Status: DC | PRN
  Administered 2020-10-30: 10 mL via INTRAVENOUS
  Filled 2020-10-30: qty 10

## 2020-10-30 NOTE — Progress Notes (Signed)
[facility] OFFICE PROGRESS NOTE   Diagnosis: Colon cancer  INTERVAL HISTORY:   Norma Colon returns as scheduled.  She completed cycle 1 FOLFIRI/Avastin 09/23/2020.  Treatment was held 10/08/2020 due to neutropenia.  She completed cycle 2 FOLFIRI/Avastin 10/14/2020, Udenyca on day of pump discontinuation.  She had no bone pain or rash following Udenyca.  She denies nausea/vomiting.  No mouth sores.  She does note "burning" on her tongue relieved with Magic mouthwash.  No diarrhea.  No bleeding except with nose blowing.  No fever, cough, shortness of breath.  She has had recent "sniffles", intermittent mild hoarseness.  She noted improvement with Mucinex.  She reports a 3-week history of right heel pain when she first starts to walk, improves with ambulation.  She reports a good appetite.  Objective:  Vital signs in last 24 hours:  Blood pressure 121/78, pulse 96, temperature 98.2 F (36.8 C), temperature source Tympanic, resp. rate 15, height 5' 8" (1.727 m), weight 216 lb 8 oz (98.2 kg), SpO2 98 %.    HEENT: No thrush or ulcers. Resp: Lungs clear bilaterally. Cardio: Regular rate and rhythm. GI: Abdomen soft and nontender.  No hepatomegaly. Vascular: No leg edema. Skin: Palms without erythema. Port-A-Cath without erythema.   Lab Results:  Lab Results  Component Value Date   WBC 6.7 10/30/2020   HGB 12.4 10/30/2020   HCT 37.6 10/30/2020   MCV 95.7 10/30/2020   PLT 179 10/30/2020   NEUTROABS 4.5 10/30/2020    Imaging:  No results found.  Medications: I have reviewed the patient's current medications.  Assessment/Plan: 1. Rectal cancer ? 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 CTs 05/24/2018-mass beginning at proximal he 7.6 cm from the anal verge, "20 "perirectal lymph nodes, no evidence of metastatic disease ? Pelvic MRI  06/01/2018-tumor measured at 10.8 cm from the anal sphincter, T3b, N1-2 left perirectal lymph nodes, each measuring 5 mm ? Radiation and Xeloda initiated 06/13/2018, completed 07/20/2018 ? Low anterior resection 09/16/2018,ypT3,ypN1c, 1 cm tumor, partial response-score 2, 0/13 lymph nodes positive, 2 tumor deposits, MSI-stable, no loss of mismatch repair protein expression ? Cycle 1 CAPOX 10/17/2018 ? Cycle 2 CAPOX 11/07/2018 (oxaliplatin dose reduced due to neutropenia) ? Cycle 3 CAPOX 11/28/2018 ? Cycle 4 CAPOX 12/19/2018 ? CTs 05/29/2019-no evidence of metastatic disease, severe right hydronephrosis, left thyroid nodule ? CTs 04/11/2020-subtle nodular lesion in the inferior right liver suspicious for a new metastasis, status post right ureteral resection and psoas hitch/flap, resolution of hydronephrosis, sacral insufficiency fractures bilaterally ? MRI liver 05/08/2020-2.1 cm segment 6 lesion suspicious for a metastasis, 4 mm suspicious segment 3 lesion 4 mm segment 4B lesion consistent with a hemangioma, no abdominal lymphadenopathy, cholelithiasis ? 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 MRI abdomen 08/13/2020-ablation defects in segment 6 and segment 3, tiny foci of restricted diffusion with arterial phase enhancement at the junctions of segment 4A and segment 4B, and segment 5 concerning for metastatic disease ? CTs 09/09/2020-stable ablation sites at segment 6 and segment 2. Small focus of hyperenhancement measuring 7 mm in the inferior right liver segment 6 anterior to the ablation site, and in the anterior right liver segment 5/8-unchanged compared to MRI and concerning for metastatic disease, no other evidence of metastatic disease. Scattered irregular groundglass opacities  in the posterior right upper lobe ? Cycle 1  FOLFIRI/Avastin 09/23/2020 ? Treatment held 10/08/2020 due to neutropenia ? Cycle 2 FOLFIRI/Avastin 10/14/2020, Udenyca ? Cycle 3 FOLFIRI/Avastin 10/30/2020, Udenyca 2. History ofTobacco use 3. 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 Alyson Ingles, urology 4. Migraine headaches 5. Restless legs 6. Arm pain during the oxaliplatin infusion and for 1 week after cycle 1. The oxaliplatin will be further diluted and infusion time extended getting with cycle 2. 7. Neutropenia secondary to chemotherapy. Oxaliplatin dose reduced beginning with cycle 2. 8. 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 Right hydronephrosis on CT 05/29/2019-referred to Dr. Alyson Ingles, surgical reimplantation of the right ureter on 08/03/2019, right ureter stent removed 09/14/2019 11. Thyroid nodules-new versus enlarged left thyroid nodule CT 05/29/2019, thyroid ultrasound 06/20/2019-multiple nodules, 1 right-sided nodule met criteria for ultrasound follow-up  Ultrasound 05/08/2020-multinodular goiter, stable nodules in the isthmus and right thyroid meeting criteria for 1 year follow-up, additional stable nodules do not meet criteria for follow-up   Disposition: Norma Colon appears stable.  She has completed 2 cycles of FOLFIRI/Avastin.  Plan to proceed with cycle 3 today as scheduled.  We reviewed the CBC from today.  Counts adequate to proceed with treatment.  She will again receive Udenyca on the day of pump discontinuation.  She will return for lab, follow-up, cycle 4 FOLFIRI/Avastin in 2 weeks.  She will contact the office in the interim with any problems.    Maisha Bogen ANP/GNP-BC   10/30/2020  11:17 AM

## 2020-10-30 NOTE — Patient Instructions (Signed)
[facility] Discharge Instructions for Patients Receiving Chemotherapy  Today you received the following chemotherapy agents: Bevacizumab (Avastin), Irinotecan, Leucovorin, and Fluorouracil (5FU)  To help prevent nausea and vomiting after your treatment, we encourage you to take your nausea medication as prescribed.  DO NOT TAKE ZOFRAN (ONDANSTERON) FOR 3 DAYS AFTER TREATMENT!  If you develop nausea and vomiting that is not controlled by your nausea medication, call the clinic.   BELOW ARE SYMPTOMS THAT SHOULD BE REPORTED IMMEDIATELY:  *FEVER GREATER THAN 100.5 F  *CHILLS WITH OR WITHOUT FEVER  NAUSEA AND VOMITING THAT IS NOT CONTROLLED WITH YOUR NAUSEA MEDICATION  *UNUSUAL SHORTNESS OF BREATH  *UNUSUAL BRUISING OR BLEEDING  TENDERNESS IN MOUTH AND THROAT WITH OR WITHOUT PRESENCE OF ULCERS  *URINARY PROBLEMS  *BOWEL PROBLEMS  UNUSUAL RASH Items with * indicate a potential emergency and should be followed up as soon as possible.  Feel free to call the clinic should you have any questions or concerns. The clinic phone number is [phone].  Please show the CHEMO ALERT CARD at check-in to the Emergency Department and triage nurse.     Bevacizumab injection What is this medicine? BEVACIZUMAB (be va SIZ yoo mab) is a monoclonal antibody. It is used to treat many types of cancer. This medicine may be used for other purposes; ask your health care provider or pharmacist if you have questions. COMMON BRAND NAME(S): Avastin, MVASI, Zirabev What should I tell my health care provider before I take this medicine? They need to know if you have any of these conditions:  diabetes  heart disease  high blood pressure  history of coughing up blood  prior anthracycline chemotherapy (e.g., doxorubicin, daunorubicin, epirubicin)  recent or ongoing radiation therapy  recent or planning to have surgery  stroke  an unusual or allergic reaction to bevacizumab,  hamster proteins, mouse proteins, other medicines, foods, dyes, or preservatives  pregnant or trying to get pregnant  breast-feeding How should I use this medicine? This medicine is for infusion into a vein. It is given by a health care professional in a hospital or clinic setting. Talk to your pediatrician regarding the use of this medicine in children. Special care may be needed. Overdosage: If you think you have taken too much of this medicine contact a poison control center or emergency room at once. NOTE: This medicine is only for you. Do not share this medicine with others. What if I miss a dose? It is important not to miss your dose. Call your doctor or health care professional if you are unable to keep an appointment. What may interact with this medicine? Interactions are not expected. This list may not describe all possible interactions. Give your health care provider a list of all the medicines, herbs, non-prescription drugs, or dietary supplements you use. Also tell them if you smoke, drink alcohol, or use illegal drugs. Some items may interact with your medicine. What should I watch for while using this medicine? Your condition will be monitored carefully while you are receiving this medicine. You will need important blood work and urine testing done while you are taking this medicine. This medicine may increase your risk to bruise or bleed. Call your doctor or health care professional if you notice any unusual bleeding. Before having surgery, talk to your health care provider to make sure it is ok. This drug can increase the risk of poor healing of your surgical site or wound. You will need to stop this drug for   28 days before surgery. After surgery, wait at least 28 days before restarting this drug. Make sure the surgical site or wound is healed enough before restarting this drug. Talk to your health care provider if questions. Do not become pregnant while taking this medicine or for  6 months after stopping it. Women should inform their doctor if they wish to become pregnant or think they might be pregnant. There is a potential for serious side effects to an unborn child. Talk to your health care professional or pharmacist for more information. Do not breast-feed an infant while taking this medicine and for 6 months after the last dose. This medicine has caused ovarian failure in some women. This medicine may interfere with the ability to have a child. You should talk to your doctor or health care professional if you are concerned about your fertility. What side effects may I notice from receiving this medicine? Side effects that you should report to your doctor or health care professional as soon as possible:  allergic reactions like skin rash, itching or hives, swelling of the face, lips, or tongue  chest pain or chest tightness  chills  coughing up blood  high fever  seizures  severe constipation  signs and symptoms of bleeding such as bloody or black, tarry stools; red or dark-brown urine; spitting up blood or brown material that looks like coffee grounds; red spots on the skin; unusual bruising or bleeding from the eye, gums, or nose  signs and symptoms of a blood clot such as breathing problems; chest pain; severe, sudden headache; pain, swelling, warmth in the leg  signs and symptoms of a stroke like changes in vision; confusion; trouble speaking or understanding; severe headaches; sudden numbness or weakness of the face, arm or leg; trouble walking; dizziness; loss of balance or coordination  stomach pain  sweating  swelling of legs or ankles  vomiting  weight gain Side effects that usually do not require medical attention (report to your doctor or health care professional if they continue or are bothersome):  back pain  changes in taste  decreased appetite  dry skin  nausea  tiredness This list may not describe all possible side effects.  Call your doctor for medical advice about side effects. You may report side effects to FDA at 1-800-FDA-1088. Where should I keep my medicine? This drug is given in a hospital or clinic and will not be stored at home. NOTE: This sheet is a summary. It may not cover all possible information. If you have questions about this medicine, talk to your doctor, pharmacist, or health care provider.  2020 Elsevier/Gold Standard (2019-07-19 10:50:46)    Leucovorin injection What is this medicine? LEUCOVORIN (loo koe VOR in) is used to prevent or treat the harmful effects of some medicines. This medicine is used to treat anemia caused by a low amount of folic acid in the body. It is also used with 5-fluorouracil (5-FU) to treat colon cancer. This medicine may be used for other purposes; ask your health care provider or pharmacist if you have questions. What should I tell my health care provider before I take this medicine? They need to know if you have any of these conditions:  anemia from low levels of vitamin B-12 in the blood  an unusual or allergic reaction to leucovorin, folic acid, other medicines, foods, dyes, or preservatives  pregnant or trying to get pregnant  breast-feeding How should I use this medicine? This medicine is for injection into a   muscle or into a vein. It is given by a health care professional in a hospital or clinic setting. Talk to your pediatrician regarding the use of this medicine in children. Special care may be needed. Overdosage: If you think you have taken too much of this medicine contact a poison control center or emergency room at once. NOTE: This medicine is only for you. Do not share this medicine with others. What if I miss a dose? This does not apply. What may interact with this medicine?  capecitabine  fluorouracil  phenobarbital  phenytoin  primidone  trimethoprim-sulfamethoxazole This list may not describe all possible interactions. Give your  health care provider a list of all the medicines, herbs, non-prescription drugs, or dietary supplements you use. Also tell them if you smoke, drink alcohol, or use illegal drugs. Some items may interact with your medicine. What should I watch for while using this medicine? Your condition will be monitored carefully while you are receiving this medicine. This medicine may increase the side effects of 5-fluorouracil, 5-FU. Tell your doctor or health care professional if you have diarrhea or mouth sores that do not get better or that get worse. What side effects may I notice from receiving this medicine? Side effects that you should report to your doctor or health care professional as soon as possible:  allergic reactions like skin rash, itching or hives, swelling of the face, lips, or tongue  breathing problems  fever, infection  mouth sores  unusual bleeding or bruising  unusually weak or tired Side effects that usually do not require medical attention (report to your doctor or health care professional if they continue or are bothersome):  constipation or diarrhea  loss of appetite  nausea, vomiting This list may not describe all possible side effects. Call your doctor for medical advice about side effects. You may report side effects to FDA at 1-800-FDA-1088. Where should I keep my medicine? This drug is given in a hospital or clinic and will not be stored at home. NOTE: This sheet is a summary. It may not cover all possible information. If you have questions about this medicine, talk to your doctor, pharmacist, or health care provider.  2020 Elsevier/Gold Standard (2008-03-27 16:50:29)   Irinotecan injection What is this medicine? IRINOTECAN (ir in oh TEE kan ) is a chemotherapy drug. It is used to treat colon and rectal cancer. This medicine may be used for other purposes; ask your health care provider or pharmacist if you have questions. COMMON BRAND NAME(S): Camptosar What  should I tell my health care provider before I take this medicine? They need to know if you have any of these conditions:  dehydration  diarrhea  infection (especially a virus infection such as chickenpox, cold sores, or herpes)  liver disease  low blood counts, like low white cell, platelet, or red cell counts  low levels of calcium, magnesium, or potassium in the blood  recent or ongoing radiation therapy  an unusual or allergic reaction to irinotecan, other medicines, foods, dyes, or preservatives  pregnant or trying to get pregnant  breast-feeding How should I use this medicine? This drug is given as an infusion into a vein. It is administered in a hospital or clinic by a specially trained health care professional. Talk to your pediatrician regarding the use of this medicine in children. Special care may be needed. Overdosage: If you think you have taken too much of this medicine contact a poison control center or emergency room at once.   NOTE: This medicine is only for you. Do not share this medicine with others. What if I miss a dose? It is important not to miss your dose. Call your doctor or health care professional if you are unable to keep an appointment. What may interact with this medicine? This medicine may interact with the following medications:  antiviral medicines for HIV or AIDS  certain antibiotics like rifampin or rifabutin  certain medicines for fungal infections like itraconazole, ketoconazole, posaconazole, and voriconazole  certain medicines for seizures like carbamazepine, phenobarbital, phenotoin  clarithromycin  gemfibrozil  nefazodone  St. John's Wort This list may not describe all possible interactions. Give your health care provider a list of all the medicines, herbs, non-prescription drugs, or dietary supplements you use. Also tell them if you smoke, drink alcohol, or use illegal drugs. Some items may interact with your medicine. What  should I watch for while using this medicine? Your condition will be monitored carefully while you are receiving this medicine. You will need important blood work done while you are taking this medicine. This drug may make you feel generally unwell. This is not uncommon, as chemotherapy can affect healthy cells as well as cancer cells. Report any side effects. Continue your course of treatment even though you feel ill unless your doctor tells you to stop. In some cases, you may be given additional medicines to help with side effects. Follow all directions for their use. You may get drowsy or dizzy. Do not drive, use machinery, or do anything that needs mental alertness until you know how this medicine affects you. Do not stand or sit up quickly, especially if you are an older patient. This reduces the risk of dizzy or fainting spells. Call your health care professional for advice if you get a fever, chills, or sore throat, or other symptoms of a cold or flu. Do not treat yourself. This medicine decreases your body's ability to fight infections. Try to avoid being around people who are sick. Avoid taking products that contain aspirin, acetaminophen, ibuprofen, naproxen, or ketoprofen unless instructed by your doctor. These medicines may hide a fever. This medicine may increase your risk to bruise or bleed. Call your doctor or health care professional if you notice any unusual bleeding. Be careful brushing and flossing your teeth or using a toothpick because you may get an infection or bleed more easily. If you have any dental work done, tell your dentist you are receiving this medicine. Do not become pregnant while taking this medicine or for 6 months after stopping it. Women should inform their health care professional if they wish to become pregnant or think they might be pregnant. Men should not father a child while taking this medicine and for 3 months after stopping it. There is potential for serious  side effects to an unborn child. Talk to your health care professional for more information. Do not breast-feed an infant while taking this medicine or for 7 days after stopping it. This medicine has caused ovarian failure in some women. This medicine may make it more difficult to get pregnant. Talk to your health care professional if you are concerned about your fertility. This medicine has caused decreased sperm counts in some men. This may make it more difficult to father a child. Talk to your health care professional if you are concerned about your fertility. What side effects may I notice from receiving this medicine? Side effects that you should report to your doctor or health care professional as   soon as possible:  allergic reactions like skin rash, itching or hives, swelling of the face, lips, or tongue  chest pain  diarrhea  flushing, runny nose, sweating during infusion  low blood counts - this medicine may decrease the number of white blood cells, red blood cells and platelets. You may be at increased risk for infections and bleeding.  nausea, vomiting  pain, swelling, warmth in the leg  signs of decreased platelets or bleeding - bruising, pinpoint red spots on the skin, black, tarry stools, blood in the urine  signs of infection - fever or chills, cough, sore throat, pain or difficulty passing urine  signs of decreased red blood cells - unusually weak or tired, fainting spells, lightheadedness Side effects that usually do not require medical attention (report to your doctor or health care professional if they continue or are bothersome):  constipation  hair loss  headache  loss of appetite  mouth sores  stomach pain This list may not describe all possible side effects. Call your doctor for medical advice about side effects. You may report side effects to FDA at 1-800-FDA-1088. Where should I keep my medicine? This drug is given in a hospital or clinic and will not  be stored at home. NOTE: This sheet is a summary. It may not cover all possible information. If you have questions about this medicine, talk to your doctor, pharmacist, or health care provider.  2020 Elsevier/Gold Standard (2018-11-11 10:09:17)   Fluorouracil, 5-FU injection What is this medicine? FLUOROURACIL, 5-FU (flure oh YOOR a sil) is a chemotherapy drug. It slows the growth of cancer cells. This medicine is used to treat many types of cancer like breast cancer, colon or rectal cancer, pancreatic cancer, and stomach cancer. This medicine may be used for other purposes; ask your health care provider or pharmacist if you have questions. COMMON BRAND NAME(S): Adrucil What should I tell my health care provider before I take this medicine? They need to know if you have any of these conditions:  blood disorders  dihydropyrimidine dehydrogenase (DPD) deficiency  infection (especially a virus infection such as chickenpox, cold sores, or herpes)  kidney disease  liver disease  malnourished, poor nutrition  recent or ongoing radiation therapy  an unusual or allergic reaction to fluorouracil, other chemotherapy, other medicines, foods, dyes, or preservatives  pregnant or trying to get pregnant  breast-feeding How should I use this medicine? This drug is given as an infusion or injection into a vein. It is administered in a hospital or clinic by a specially trained health care professional. Talk to your pediatrician regarding the use of this medicine in children. Special care may be needed. Overdosage: If you think you have taken too much of this medicine contact a poison control center or emergency room at once. NOTE: This medicine is only for you. Do not share this medicine with others. What if I miss a dose? It is important not to miss your dose. Call your doctor or health care professional if you are unable to keep an appointment. What may interact with this  medicine?  allopurinol  cimetidine  dapsone  digoxin  hydroxyurea  leucovorin  levamisole  medicines for seizures like ethotoin, fosphenytoin, phenytoin  medicines to increase blood counts like filgrastim, pegfilgrastim, sargramostim  medicines that treat or prevent blood clots like warfarin, enoxaparin, and dalteparin  methotrexate  metronidazole  pyrimethamine  some other chemotherapy drugs like busulfan, cisplatin, estramustine, vinblastine  trimethoprim  trimetrexate  vaccines Talk to your doctor   or health care professional before taking any of these medicines:  acetaminophen  aspirin  ibuprofen  ketoprofen  naproxen This list may not describe all possible interactions. Give your health care provider a list of all the medicines, herbs, non-prescription drugs, or dietary supplements you use. Also tell them if you smoke, drink alcohol, or use illegal drugs. Some items may interact with your medicine. What should I watch for while using this medicine? Visit your doctor for checks on your progress. This drug may make you feel generally unwell. This is not uncommon, as chemotherapy can affect healthy cells as well as cancer cells. Report any side effects. Continue your course of treatment even though you feel ill unless your doctor tells you to stop. In some cases, you may be given additional medicines to help with side effects. Follow all directions for their use. Call your doctor or health care professional for advice if you get a fever, chills or sore throat, or other symptoms of a cold or flu. Do not treat yourself. This drug decreases your body's ability to fight infections. Try to avoid being around people who are sick. This medicine may increase your risk to bruise or bleed. Call your doctor or health care professional if you notice any unusual bleeding. Be careful brushing and flossing your teeth or using a toothpick because you may get an infection or bleed  more easily. If you have any dental work done, tell your dentist you are receiving this medicine. Avoid taking products that contain aspirin, acetaminophen, ibuprofen, naproxen, or ketoprofen unless instructed by your doctor. These medicines may hide a fever. Do not become pregnant while taking this medicine. Women should inform their doctor if they wish to become pregnant or think they might be pregnant. There is a potential for serious side effects to an unborn child. Talk to your health care professional or pharmacist for more information. Do not breast-feed an infant while taking this medicine. Men should inform their doctor if they wish to father a child. This medicine may lower sperm counts. Do not treat diarrhea with over the counter products. Contact your doctor if you have diarrhea that lasts more than 2 days or if it is severe and watery. This medicine can make you more sensitive to the sun. Keep out of the sun. If you cannot avoid being in the sun, wear protective clothing and use sunscreen. Do not use sun lamps or tanning beds/booths. What side effects may I notice from receiving this medicine? Side effects that you should report to your doctor or health care professional as soon as possible:  allergic reactions like skin rash, itching or hives, swelling of the face, lips, or tongue  low blood counts - this medicine may decrease the number of white blood cells, red blood cells and platelets. You may be at increased risk for infections and bleeding.  signs of infection - fever or chills, cough, sore throat, pain or difficulty passing urine  signs of decreased platelets or bleeding - bruising, pinpoint red spots on the skin, black, tarry stools, blood in the urine  signs of decreased red blood cells - unusually weak or tired, fainting spells, lightheadedness  breathing problems  changes in vision  chest pain  mouth sores  nausea and vomiting  pain, swelling, redness at site  where injected  pain, tingling, numbness in the hands or feet  redness, swelling, or sores on hands or feet  stomach pain  unusual bleeding Side effects that usually do not   require medical attention (report to your doctor or health care professional if they continue or are bothersome):  changes in finger or toe nails  diarrhea  dry or itchy skin  hair loss  headache  loss of appetite  sensitivity of eyes to the light  stomach upset  unusually teary eyes This list may not describe all possible side effects. Call your doctor for medical advice about side effects. You may report side effects to FDA at 1-800-FDA-1088. Where should I keep my medicine? This drug is given in a hospital or clinic and will not be stored at home. NOTE: This sheet is a summary. It may not cover all possible information. If you have questions about this medicine, talk to your doctor, pharmacist, or health care provider.  2020 Elsevier/Gold Standard (2008-01-25 13:53:16)  

## 2020-10-30 NOTE — Patient Instructions (Signed)

## 2020-10-30 NOTE — Progress Notes (Signed)
Pt discharged in no apparent distress. Pt left ambulatory without assistance. Pt aware of discharge instructions and verbalized understanding and had no further questions.  

## 2020-10-31 ENCOUNTER — Telehealth: Payer: Self-pay | Admitting: Nurse Practitioner

## 2020-10-31 NOTE — Telephone Encounter (Signed)
Scheduled appointments per 12/6 los. Spoke to patient who is aware of appointment date and time.  

## 2020-11-01 ENCOUNTER — Other Ambulatory Visit: Payer: Self-pay

## 2020-11-01 ENCOUNTER — Inpatient Hospital Stay: Payer: 59

## 2020-11-01 VITALS — BP 142/90 | HR 96 | Resp 19

## 2020-11-01 DIAGNOSIS — C787 Secondary malignant neoplasm of liver and intrahepatic bile duct: Secondary | ICD-10-CM

## 2020-11-01 DIAGNOSIS — Z5111 Encounter for antineoplastic chemotherapy: Secondary | ICD-10-CM | POA: Diagnosis not present

## 2020-11-01 DIAGNOSIS — C20 Malignant neoplasm of rectum: Secondary | ICD-10-CM

## 2020-11-01 MED ORDER — HEPARIN SOD (PORK) LOCK FLUSH 100 UNIT/ML IV SOLN
500.0000 [IU] | Freq: Once | INTRAVENOUS | Status: AC | PRN
  Administered 2020-11-01: 500 [IU]
  Filled 2020-11-01: qty 5

## 2020-11-01 MED ORDER — SODIUM CHLORIDE 0.9% FLUSH
10.0000 mL | INTRAVENOUS | Status: DC | PRN
  Administered 2020-11-01: 10 mL
  Filled 2020-11-01: qty 10

## 2020-11-01 MED ORDER — PEGFILGRASTIM-CBQV 6 MG/0.6ML ~~LOC~~ SOSY
6.0000 mg | PREFILLED_SYRINGE | Freq: Once | SUBCUTANEOUS | Status: AC
  Administered 2020-11-01: 6 mg via SUBCUTANEOUS

## 2020-11-01 MED ORDER — PEGFILGRASTIM-CBQV 6 MG/0.6ML ~~LOC~~ SOSY
PREFILLED_SYRINGE | SUBCUTANEOUS | Status: AC
  Filled 2020-11-01: qty 0.6

## 2020-11-01 NOTE — Patient Instructions (Signed)
Pegfilgrastim injection What is this medicine? PEGFILGRASTIM (PEG fil gra stim) is a long-acting granulocyte colony-stimulating factor that stimulates the growth of neutrophils, a type of white blood cell important in the body's fight against infection. It is used to reduce the incidence of fever and infection in patients with certain types of cancer who are receiving chemotherapy that affects the bone marrow, and to increase survival after being exposed to high doses of radiation. This medicine may be used for other purposes; ask your health care provider or pharmacist if you have questions. COMMON BRAND NAME(S): Fulphila, Neulasta, Nyvepria, UDENYCA, Ziextenzo What should I tell my health care provider before I take this medicine? They need to know if you have any of these conditions:  kidney disease  latex allergy  ongoing radiation therapy  sickle cell disease  skin reactions to acrylic adhesives (On-Body Injector only)  an unusual or allergic reaction to pegfilgrastim, filgrastim, other medicines, foods, dyes, or preservatives  pregnant or trying to get pregnant  breast-feeding How should I use this medicine? This medicine is for injection under the skin. If you get this medicine at home, you will be taught how to prepare and give the pre-filled syringe or how to use the On-body Injector. Refer to the patient Instructions for Use for detailed instructions. Use exactly as directed. Tell your healthcare provider immediately if you suspect that the On-body Injector may not have performed as intended or if you suspect the use of the On-body Injector resulted in a missed or partial dose. It is important that you put your used needles and syringes in a special sharps container. Do not put them in a trash can. If you do not have a sharps container, call your pharmacist or healthcare provider to get one. Talk to your pediatrician regarding the use of this medicine in children. While this drug  may be prescribed for selected conditions, precautions do apply. Overdosage: If you think you have taken too much of this medicine contact a poison control center or emergency room at once. NOTE: This medicine is only for you. Do not share this medicine with others. What if I miss a dose? It is important not to miss your dose. Call your doctor or health care professional if you miss your dose. If you miss a dose due to an On-body Injector failure or leakage, a new dose should be administered as soon as possible using a single prefilled syringe for manual use. What may interact with this medicine? Interactions have not been studied. This list may not describe all possible interactions. Give your health care provider a list of all the medicines, herbs, non-prescription drugs, or dietary supplements you use. Also tell them if you smoke, drink alcohol, or use illegal drugs. Some items may interact with your medicine. What should I watch for while using this medicine? Your condition will be monitored carefully while you are receiving this medicine. You may need blood work done while you are taking this medicine. Talk to your health care provider about your risk of cancer. You may be more at risk for certain types of cancer if you take this medicine. If you are going to need a MRI, CT scan, or other procedure, tell your doctor that you are using this medicine (On-Body Injector only). What side effects may I notice from receiving this medicine? Side effects that you should report to your doctor or health care professional as soon as possible:  allergic reactions (skin rash, itching or hives, swelling of   the face, lips, or tongue)  back pain  dizziness  fever  pain, redness, or irritation at site where injected  pinpoint red spots on the skin  red or dark-brown urine  shortness of breath or breathing problems  stomach or side pain, or pain at the shoulder  swelling  tiredness  trouble  passing urine or change in the amount of urine  unusual bruising or bleeding Side effects that usually do not require medical attention (report to your doctor or health care professional if they continue or are bothersome):  bone pain  muscle pain This list may not describe all possible side effects. Call your doctor for medical advice about side effects. You may report side effects to FDA at 1-800-FDA-1088. Where should I keep my medicine? Keep out of the reach of children. If you are using this medicine at home, you will be instructed on how to store it. Throw away any unused medicine after the expiration date on the label. NOTE: This sheet is a summary. It may not cover all possible information. If you have questions about this medicine, talk to your doctor, pharmacist, or health care provider.  2021 [employer]vier/Gold Standard (2019-10-13 13:20:51)  

## 2020-11-07 ENCOUNTER — Telehealth: Payer: Self-pay

## 2020-11-07 NOTE — Telephone Encounter (Signed)
Returned call to pt. Pt states she has had a cold for the last 2 weeks and is taking mucinex to dry it up but now when she blows her nose it is very bloody. No nose bleeds only blood noted is when she blows her nose. No nausea, vomiting or diarrhea. Pt states she is dehydrated and wonders if she needs IV fluids. States she has not been drinking enough lately but will try to increase po intake. Encouraged pt to try using saline nose spray and /or a cool mist humidifier to help with nose bleeds. MD notified.

## 2020-11-10 ENCOUNTER — Other Ambulatory Visit: Payer: Self-pay | Admitting: Oncology

## 2020-11-14 ENCOUNTER — Encounter: Payer: Self-pay | Admitting: Nurse Practitioner

## 2020-11-14 ENCOUNTER — Inpatient Hospital Stay: Payer: 59 | Attending: Oncology

## 2020-11-14 ENCOUNTER — Inpatient Hospital Stay: Payer: 59

## 2020-11-14 ENCOUNTER — Other Ambulatory Visit: Payer: Self-pay

## 2020-11-14 ENCOUNTER — Inpatient Hospital Stay (HOSPITAL_BASED_OUTPATIENT_CLINIC_OR_DEPARTMENT_OTHER): Payer: 59 | Admitting: Nurse Practitioner

## 2020-11-14 VITALS — BP 123/69 | HR 82 | Temp 98.1°F | Resp 20 | Ht 68.0 in | Wt 219.1 lb

## 2020-11-14 DIAGNOSIS — C787 Secondary malignant neoplasm of liver and intrahepatic bile duct: Secondary | ICD-10-CM

## 2020-11-14 DIAGNOSIS — Z5189 Encounter for other specified aftercare: Secondary | ICD-10-CM | POA: Insufficient documentation

## 2020-11-14 DIAGNOSIS — Z5111 Encounter for antineoplastic chemotherapy: Secondary | ICD-10-CM | POA: Insufficient documentation

## 2020-11-14 DIAGNOSIS — C20 Malignant neoplasm of rectum: Secondary | ICD-10-CM | POA: Diagnosis present

## 2020-11-14 DIAGNOSIS — Z95828 Presence of other vascular implants and grafts: Secondary | ICD-10-CM

## 2020-11-14 DIAGNOSIS — Z79899 Other long term (current) drug therapy: Secondary | ICD-10-CM | POA: Insufficient documentation

## 2020-11-14 LAB — CMP (CANCER CENTER ONLY)
ALT: 54 U/L — ABNORMAL HIGH (ref 0–44)
AST: 37 U/L (ref 15–41)
Albumin: 3.6 g/dL (ref 3.5–5.0)
Alkaline Phosphatase: 138 U/L — ABNORMAL HIGH (ref 38–126)
Anion gap: 7 (ref 5–15)
BUN: 14 mg/dL (ref 6–20)
CO2: 26 mmol/L (ref 22–32)
Calcium: 9.1 mg/dL (ref 8.9–10.3)
Chloride: 107 mmol/L (ref 98–111)
Creatinine: 1.08 mg/dL — ABNORMAL HIGH (ref 0.44–1.00)
GFR, Estimated: 60 mL/min — ABNORMAL LOW (ref >60)
Glucose, Bld: 107 mg/dL — ABNORMAL HIGH (ref 70–99)
Potassium: 3.7 mmol/L (ref 3.5–5.1)
Sodium: 140 mmol/L (ref 135–145)
Total Bilirubin: 0.4 mg/dL (ref 0.3–1.2)
Total Protein: 6.9 g/dL (ref 6.5–8.1)

## 2020-11-14 LAB — CBC WITH DIFFERENTIAL (CANCER CENTER ONLY)
Abs Immature Granulocytes: 0.54 10*3/uL — ABNORMAL HIGH (ref 0.00–0.07)
Basophils Absolute: 0.1 10*3/uL (ref 0.0–0.1)
Basophils Relative: 1 %
Eosinophils Absolute: 0.2 10*3/uL (ref 0.0–0.5)
Eosinophils Relative: 2 %
HCT: 33 % — ABNORMAL LOW (ref 36.0–46.0)
Hemoglobin: 11 g/dL — ABNORMAL LOW (ref 12.0–15.0)
Immature Granulocytes: 6 %
Lymphocytes Relative: 15 %
Lymphs Abs: 1.4 10*3/uL (ref 0.7–4.0)
MCH: 31.8 pg (ref 26.0–34.0)
MCHC: 33.3 g/dL (ref 30.0–36.0)
MCV: 95.4 fL (ref 80.0–100.0)
Monocytes Absolute: 0.9 10*3/uL (ref 0.1–1.0)
Monocytes Relative: 9 %
Neutro Abs: 6.6 10*3/uL (ref 1.7–7.7)
Neutrophils Relative %: 67 %
Platelet Count: 158 10*3/uL (ref 150–400)
RBC: 3.46 MIL/uL — ABNORMAL LOW (ref 3.87–5.11)
RDW: 15.9 % — ABNORMAL HIGH (ref 11.5–15.5)
WBC Count: 9.7 10*3/uL (ref 4.0–10.5)
nRBC: 0.4 % — ABNORMAL HIGH (ref 0.0–0.2)

## 2020-11-14 LAB — TOTAL PROTEIN, URINE DIPSTICK: Protein, ur: NEGATIVE mg/dL

## 2020-11-14 MED ORDER — SODIUM CHLORIDE 0.9 % IV SOLN
5.0000 mg/kg | Freq: Once | INTRAVENOUS | Status: AC
  Administered 2020-11-14: 500 mg via INTRAVENOUS
  Filled 2020-11-14: qty 16

## 2020-11-14 MED ORDER — FLUOROURACIL CHEMO INJECTION 2.5 GM/50ML
400.0000 mg/m2 | Freq: Once | INTRAVENOUS | Status: AC
  Administered 2020-11-14: 900 mg via INTRAVENOUS
  Filled 2020-11-14: qty 18

## 2020-11-14 MED ORDER — SODIUM CHLORIDE 0.9 % IV SOLN
Freq: Once | INTRAVENOUS | Status: AC
  Filled 2020-11-14: qty 250

## 2020-11-14 MED ORDER — SODIUM CHLORIDE 0.9 % IV SOLN
10.0000 mg | Freq: Once | INTRAVENOUS | Status: AC
  Administered 2020-11-14: 10 mg via INTRAVENOUS
  Filled 2020-11-14: qty 10

## 2020-11-14 MED ORDER — SODIUM CHLORIDE 0.9 % IV SOLN
180.0000 mg/m2 | Freq: Once | INTRAVENOUS | Status: AC
  Administered 2020-11-14: 400 mg via INTRAVENOUS
  Filled 2020-11-14: qty 15

## 2020-11-14 MED ORDER — SODIUM CHLORIDE 0.9 % IV SOLN
400.0000 mg/m2 | Freq: Once | INTRAVENOUS | Status: AC
  Administered 2020-11-14: 880 mg via INTRAVENOUS
  Filled 2020-11-14: qty 44

## 2020-11-14 MED ORDER — ATROPINE SULFATE 1 MG/ML IJ SOLN
INTRAMUSCULAR | Status: AC
  Filled 2020-11-14: qty 1

## 2020-11-14 MED ORDER — SODIUM CHLORIDE 0.9% FLUSH
10.0000 mL | INTRAVENOUS | Status: AC | PRN
  Administered 2020-11-14: 10 mL
  Filled 2020-11-14: qty 10

## 2020-11-14 MED ORDER — SODIUM CHLORIDE 0.9 % IV SOLN
2400.0000 mg/m2 | INTRAVENOUS | Status: DC
  Administered 2020-11-14: 5300 mg via INTRAVENOUS
  Filled 2020-11-14: qty 106

## 2020-11-14 MED ORDER — PALONOSETRON HCL INJECTION 0.25 MG/5ML
0.2500 mg | Freq: Once | INTRAVENOUS | Status: AC
  Administered 2020-11-14: 0.25 mg via INTRAVENOUS

## 2020-11-14 MED ORDER — DIPHENOXYLATE-ATROPINE 2.5-0.025 MG PO TABS: 1.0000 | ORAL_TABLET | Freq: Four times a day (QID) | ORAL | 0 refills | Status: DC | PRN

## 2020-11-14 MED ORDER — PALONOSETRON HCL INJECTION 0.25 MG/5ML
INTRAVENOUS | Status: AC
  Filled 2020-11-14: qty 5

## 2020-11-14 MED ORDER — SODIUM CHLORIDE 0.9% FLUSH
10.0000 mL | INTRAVENOUS | Status: DC | PRN
  Filled 2020-11-14: qty 10

## 2020-11-14 MED ORDER — HEPARIN SOD (PORK) LOCK FLUSH 100 UNIT/ML IV SOLN
500.0000 [IU] | Freq: Once | INTRAVENOUS | Status: DC | PRN
  Filled 2020-11-14: qty 5

## 2020-11-14 MED ORDER — ATROPINE SULFATE 1 MG/ML IJ SOLN: 0.5000 mg | Freq: Once | INTRAMUSCULAR | Status: DC | PRN

## 2020-11-14 NOTE — Progress Notes (Signed)
[place] OFFICE PROGRESS NOTE   Diagnosis: Colon cancer  INTERVAL HISTORY:   [name] returns as scheduled.  She completed cycle 2 FOLFIRI/Avastin 10/14/2020 with Udenyca on the day of pump discontinuation.  She denies nausea/vomiting.  No mouth sores.  No diarrhea.  She felt like she was dehydrated last week and wondered if she needed IV fluids.  She was taking in adequate fluids per her report.  As noted above no vomiting and no diarrhea.  She felt weak.  She was having blood with nose blowing and wondered if the blood loss was causing weakness.  No cough or shortness of breath.  Objective:  Vital signs in last 24 hours:  Blood pressure 123/69, pulse 82, temperature 98.1 F (36.7 C), temperature source Tympanic, resp. rate 20, height _0  (1.727 m), weight 219 lb 1.6 oz (99.4 kg), SpO2 98 %.    HEENT: No thrush or ulcers. Resp: Lungs clear bilaterally. Cardio: Regular rate and rhythm. GI: Abdomen soft and nontender.  No hepatomegaly. Vascular: No leg edema.  Skin: Palms without erythema. Port-A-Cath without erythema.   Lab Results:  Lab Results  Component Value Date   WBC 9.7 11/14/2020   HGB 11.0 (L) 11/14/2020   HCT 33.0 (L) 11/14/2020   MCV 95.4 11/14/2020   PLT 158 11/14/2020   NEUTROABS 6.6 11/14/2020    Imaging:  No results found.  Medications: I have reviewed the patient's current medications.  Assessment/Plan: 1. Rectal cancer ? 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 CTs 05/24/2018-mass beginning at proximal he 7.6 cm from the anal verge, "20 "perirectal lymph nodes, no evidence of metastatic disease ? Pelvic MRI 06/01/2018-tumor measured at 10.8 cm from the anal sphincter, T3b, N1-2 left perirectal lymph nodes, each measuring 5 mm ? Radiation and Xeloda initiated 06/13/2018, completed 07/20/2018 ? Low  anterior resection 09/16/2018,ypT3,ypN1c, 1 cm tumor, partial response-score 2, 0/13 lymph nodes positive, 2 tumor deposits, MSI-stable, no loss of mismatch repair protein expression ? Cycle 1 CAPOX 10/17/2018 ? Cycle 2 CAPOX 11/07/2018 (oxaliplatin dose reduced due to neutropenia) ? Cycle 3 CAPOX 11/28/2018 ? Cycle 4 CAPOX 12/19/2018 ? CTs 05/29/2019-no evidence of metastatic disease, severe right hydronephrosis, left thyroid nodule ? CTs 04/11/2020-subtle nodular lesion in the inferior right liver suspicious for a new metastasis, status post right ureteral resection and psoas hitch/flap, resolution of hydronephrosis, sacral insufficiency fractures bilaterally ? MRI liver 05/08/2020-2.1 cm segment 6 lesion suspicious for a metastasis, 4 mm suspicious segment 3 lesion 4 mm segment 4B lesion consistent with a hemangioma, no abdominal lymphadenopathy, cholelithiasis ? Ablation of segment 6 and segment 3 lesions 06/19/2020, pathology from segment 6 biopsy confirmed metastatic adenocarcinoma-microsatellite stable, tumor mutation burden 3, K-ras wild type, NRAS wild-type, [id number] ? MRI abdomen 08/13/2020-ablation defects in segment 6 and segment 3, tiny foci of restricted diffusion with arterial phase enhancement at the junctions of segment 4A and segment 4B, and segment 5 concerning for metastatic disease ? CTs 09/09/2020-stable ablation sites at segment 6 and segment 2. Small focus of hyperenhancement measuring 7 mm in the inferior right liver segment 6 anterior to the ablation site, and in the anterior right liver segment 5/8-unchanged compared to MRI and concerning for metastatic disease, no other evidence of metastatic disease. Scattered irregular groundglass opacities in the posterior right upper lobe ? Cycle 1 FOLFIRI/Avastin 09/23/2020 ? Treatment held 10/08/2020 due to neutropenia ? Cycle 2 FOLFIRI/Avastin 10/14/2020, Udenyca ?  Cycle 3 FOLFIRI/Avastin 10/30/2020, Udenyca ? Cycle 4 FOLFIRI/Avastin 11/14/2020,  Udenyca 2. History ofTobacco use 3. 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 Alyson Ingles, urology 4. Migraine headaches 5. Restless legs 6. Arm pain during the oxaliplatin infusion and for 1 week after cycle 1. The oxaliplatin will be further diluted and infusion time extended getting with cycle 2. 7. Neutropenia secondary to chemotherapy. Oxaliplatin dose reduced beginning with cycle 2. 8. 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 Right hydronephrosis on CT 05/29/2019-referred to Dr. Alyson Ingles, surgical reimplantation of the right ureter on 08/03/2019, right ureter stent removed 09/14/2019 11. Thyroid nodules-new versus enlarged left thyroid nodule CT 05/29/2019, thyroid ultrasound 06/20/2019-multiple nodules, 1 right-sided nodule met criteria for ultrasound follow-up  Ultrasound 05/08/2020-multinodular goiter, stable nodules in the isthmus and right thyroid meeting criteria for 1 year follow-up, additional stable nodules do not meet criteria for follow-up   Disposition: Norma Colon appears stable.  She has completed 3 cycles of FOLFIRI/Avastin.  Main complaint following cycle 3 was blood with nose blowing for several days.  We discussed the possibility this was related to Avastin as well as dry air.  She had no other bleeding.  Plan to proceed with cycle 4 FOLFIRI/Avastin today as scheduled.  Restaging CTs after cycle 5.  She will contact the office if the blood with nose blowing recurs/increases.  She will try a coolmist humidifier.  We reviewed the CBC and chemistry panel from today.  Labs adequate to proceed with treatment.  She will return for lab, follow-up, FOLFIRI/Avastin in 2 weeks.  She  will contact the office in the interim as outlined above with any other problems.   Patient seen with Dr. Benay Spice.   Manuel Lawhead ANP/GNP-BC   11/14/2020  10:09 AM This was a shared visit with Ned Card.  Ms. Bares is tolerating the FOLFIRI/Avastin well.  We discussed the plan to complete 5 cycles prior to a restaging CT evaluation.  I was present for greater than 50% of today's visit.  I performed medical decision making.  Julieanne Manson, MD

## 2020-11-14 NOTE — Patient Instructions (Signed)
[facility] Discharge Instructions for Patients Receiving Chemotherapy  Today you received the following chemotherapy agents: Bevacizumab (Avastin), Irinotecan, Leucovorin, and Fluorouracil (5FU)  To help prevent nausea and vomiting after your treatment, we encourage you to take your nausea medication as prescribed.  DO NOT TAKE ZOFRAN (ONDANSTERON) FOR 3 DAYS AFTER TREATMENT!  If you develop nausea and vomiting that is not controlled by your nausea medication, call the clinic.   BELOW ARE SYMPTOMS THAT SHOULD BE REPORTED IMMEDIATELY:  *FEVER GREATER THAN 100.5 F  *CHILLS WITH OR WITHOUT FEVER  NAUSEA AND VOMITING THAT IS NOT CONTROLLED WITH YOUR NAUSEA MEDICATION  *UNUSUAL SHORTNESS OF BREATH  *UNUSUAL BRUISING OR BLEEDING  TENDERNESS IN MOUTH AND THROAT WITH OR WITHOUT PRESENCE OF ULCERS  *URINARY PROBLEMS  *BOWEL PROBLEMS  UNUSUAL RASH Items with * indicate a potential emergency and should be followed up as soon as possible.  Feel free to call the clinic should you have any questions or concerns. The clinic phone number is [phone].  Please show the [facility] at check-in to the Emergency Department and triage nurse.     Bevacizumab injection What is this medicine? BEVACIZUMAB (be va SIZ yoo mab) is a monoclonal antibody. It is used to treat many types of cancer. This medicine may be used for other purposes; ask your health care provider or pharmacist if you have questions. COMMON BRAND NAME(S): Avastin, MVASI, Zirabev What should I tell my health care provider before I take this medicine? They need to know if you have any of these conditions:  diabetes  heart disease  high blood pressure  history of coughing up blood  prior anthracycline chemotherapy (e.g., doxorubicin, daunorubicin, epirubicin)  recent or ongoing radiation therapy  recent or planning to have surgery  stroke  an unusual or allergic reaction to bevacizumab,  hamster proteins, mouse proteins, other medicines, foods, dyes, or preservatives  pregnant or trying to get pregnant  breast-feeding How should I use this medicine? This medicine is for infusion into a vein. It is given by a health care professional in a hospital or clinic setting. Talk to your pediatrician regarding the use of this medicine in children. Special care may be needed. Overdosage: If you think you have taken too much of this medicine contact a poison control center or emergency room at once. NOTE: This medicine is only for you. Do not share this medicine with others. What if I miss a dose? It is important not to miss your dose. Call your doctor or health care professional if you are unable to keep an appointment. What may interact with this medicine? Interactions are not expected. This list may not describe all possible interactions. Give your health care provider a list of all the medicines, herbs, non-prescription drugs, or dietary supplements you use. Also tell them if you smoke, drink alcohol, or use illegal drugs. Some items may interact with your medicine. What should I watch for while using this medicine? Your condition will be monitored carefully while you are receiving this medicine. You will need important blood work and urine testing done while you are taking this medicine. This medicine may increase your risk to bruise or bleed. Call your doctor or health care professional if you notice any unusual bleeding. Before having surgery, talk to your health care provider to make sure it is ok. This drug can increase the risk of poor healing of your surgical site or wound. You will need to stop this drug for  28 days before surgery. After surgery, wait at least 28 days before restarting this drug. Make sure the surgical site or wound is healed enough before restarting this drug. Talk to your health care provider if questions. Do not become pregnant while taking this medicine or for  6 months after stopping it. Women should inform their doctor if they wish to become pregnant or think they might be pregnant. There is a potential for serious side effects to an unborn child. Talk to your health care professional or pharmacist for more information. Do not breast-feed an infant while taking this medicine and for 6 months after the last dose. This medicine has caused ovarian failure in some women. This medicine may interfere with the ability to have a child. You should talk to your doctor or health care professional if you are concerned about your fertility. What side effects may I notice from receiving this medicine? Side effects that you should report to your doctor or health care professional as soon as possible:  allergic reactions like skin rash, itching or hives, swelling of the face, lips, or tongue  chest pain or chest tightness  chills  coughing up blood  high fever  seizures  severe constipation  signs and symptoms of bleeding such as bloody or black, tarry stools; red or dark-brown urine; spitting up blood or brown material that looks like coffee grounds; red spots on the skin; unusual bruising or bleeding from the eye, gums, or nose  signs and symptoms of a blood clot such as breathing problems; chest pain; severe, sudden headache; pain, swelling, warmth in the leg  signs and symptoms of a stroke like changes in vision; confusion; trouble speaking or understanding; severe headaches; sudden numbness or weakness of the face, arm or leg; trouble walking; dizziness; loss of balance or coordination  stomach pain  sweating  swelling of legs or ankles  vomiting  weight gain Side effects that usually do not require medical attention (report to your doctor or health care professional if they continue or are bothersome):  back pain  changes in taste  decreased appetite  dry skin  nausea  tiredness This list may not describe all possible side effects.  Call your doctor for medical advice about side effects. You may report side effects to FDA at 1-800-FDA-1088. Where should I keep my medicine? This drug is given in a hospital or clinic and will not be stored at home. NOTE: This sheet is a summary. It may not cover all possible information. If you have questions about this medicine, talk to your doctor, pharmacist, or health care provider.  2020 Elsevier/Gold Standard (2019-07-19 10:50:46)    Leucovorin injection What is this medicine? LEUCOVORIN (loo koe VOR in) is used to prevent or treat the harmful effects of some medicines. This medicine is used to treat anemia caused by a low amount of folic acid in the body. It is also used with 5-fluorouracil (5-FU) to treat colon cancer. This medicine may be used for other purposes; ask your health care provider or pharmacist if you have questions. What should I tell my health care provider before I take this medicine? They need to know if you have any of these conditions:  anemia from low levels of vitamin B-12 in the blood  an unusual or allergic reaction to leucovorin, folic acid, other medicines, foods, dyes, or preservatives  pregnant or trying to get pregnant  breast-feeding How should I use this medicine? This medicine is for injection into a  muscle or into a vein. It is given by a health care professional in a hospital or clinic setting. Talk to your pediatrician regarding the use of this medicine in children. Special care may be needed. Overdosage: If you think you have taken too much of this medicine contact a poison control center or emergency room at once. NOTE: This medicine is only for you. Do not share this medicine with others. What if I miss a dose? This does not apply. What may interact with this medicine?  capecitabine  fluorouracil  phenobarbital  phenytoin  primidone  trimethoprim-sulfamethoxazole This list may not describe all possible interactions. Give your  health care provider a list of all the medicines, herbs, non-prescription drugs, or dietary supplements you use. Also tell them if you smoke, drink alcohol, or use illegal drugs. Some items may interact with your medicine. What should I watch for while using this medicine? Your condition will be monitored carefully while you are receiving this medicine. This medicine may increase the side effects of 5-fluorouracil, 5-FU. Tell your doctor or health care professional if you have diarrhea or mouth sores that do not get better or that get worse. What side effects may I notice from receiving this medicine? Side effects that you should report to your doctor or health care professional as soon as possible:  allergic reactions like skin rash, itching or hives, swelling of the face, lips, or tongue  breathing problems  fever, infection  mouth sores  unusual bleeding or bruising  unusually weak or tired Side effects that usually do not require medical attention (report to your doctor or health care professional if they continue or are bothersome):  constipation or diarrhea  loss of appetite  nausea, vomiting This list may not describe all possible side effects. Call your doctor for medical advice about side effects. You may report side effects to FDA at 1-800-FDA-1088. Where should I keep my medicine? This drug is given in a hospital or clinic and will not be stored at home. NOTE: This sheet is a summary. It may not cover all possible information. If you have questions about this medicine, talk to your doctor, pharmacist, or health care provider.  2020 Elsevier/Gold Standard (2008-03-27 16:50:29)   Irinotecan injection What is this medicine? IRINOTECAN (ir in oh TEE kan ) is a chemotherapy drug. It is used to treat colon and rectal cancer. This medicine may be used for other purposes; ask your health care provider or pharmacist if you have questions. COMMON BRAND NAME(S): Camptosar What  should I tell my health care provider before I take this medicine? They need to know if you have any of these conditions:  dehydration  diarrhea  infection (especially a virus infection such as chickenpox, cold sores, or herpes)  liver disease  low blood counts, like low white cell, platelet, or red cell counts  low levels of calcium, magnesium, or potassium in the blood  recent or ongoing radiation therapy  an unusual or allergic reaction to irinotecan, other medicines, foods, dyes, or preservatives  pregnant or trying to get pregnant  breast-feeding How should I use this medicine? This drug is given as an infusion into a vein. It is administered in a hospital or clinic by a specially trained health care professional. Talk to your pediatrician regarding the use of this medicine in children. Special care may be needed. Overdosage: If you think you have taken too much of this medicine contact a poison control center or emergency room at once.  NOTE: This medicine is only for you. Do not share this medicine with others. What if I miss a dose? It is important not to miss your dose. Call your doctor or health care professional if you are unable to keep an appointment. What may interact with this medicine? This medicine may interact with the following medications:  antiviral medicines for HIV or AIDS  certain antibiotics like rifampin or rifabutin  certain medicines for fungal infections like itraconazole, ketoconazole, posaconazole, and voriconazole  certain medicines for seizures like carbamazepine, phenobarbital, phenotoin  clarithromycin  gemfibrozil  nefazodone  St. John's Wort This list may not describe all possible interactions. Give your health care provider a list of all the medicines, herbs, non-prescription drugs, or dietary supplements you use. Also tell them if you smoke, drink alcohol, or use illegal drugs. Some items may interact with your medicine. What  should I watch for while using this medicine? Your condition will be monitored carefully while you are receiving this medicine. You will need important blood work done while you are taking this medicine. This drug may make you feel generally unwell. This is not uncommon, as chemotherapy can affect healthy cells as well as cancer cells. Report any side effects. Continue your course of treatment even though you feel ill unless your doctor tells you to stop. In some cases, you may be given additional medicines to help with side effects. Follow all directions for their use. You may get drowsy or dizzy. Do not drive, use machinery, or do anything that needs mental alertness until you know how this medicine affects you. Do not stand or sit up quickly, especially if you are an older patient. This reduces the risk of dizzy or fainting spells. Call your health care professional for advice if you get a fever, chills, or sore throat, or other symptoms of a cold or flu. Do not treat yourself. This medicine decreases your body's ability to fight infections. Try to avoid being around people who are sick. Avoid taking products that contain aspirin, acetaminophen, ibuprofen, naproxen, or ketoprofen unless instructed by your doctor. These medicines may hide a fever. This medicine may increase your risk to bruise or bleed. Call your doctor or health care professional if you notice any unusual bleeding. Be careful brushing and flossing your teeth or using a toothpick because you may get an infection or bleed more easily. If you have any dental work done, tell your dentist you are receiving this medicine. Do not become pregnant while taking this medicine or for 6 months after stopping it. Women should inform their health care professional if they wish to become pregnant or think they might be pregnant. Men should not father a child while taking this medicine and for 3 months after stopping it. There is potential for serious  side effects to an unborn child. Talk to your health care professional for more information. Do not breast-feed an infant while taking this medicine or for 7 days after stopping it. This medicine has caused ovarian failure in some women. This medicine may make it more difficult to get pregnant. Talk to your health care professional if you are concerned about your fertility. This medicine has caused decreased sperm counts in some men. This may make it more difficult to father a child. Talk to your health care professional if you are concerned about your fertility. What side effects may I notice from receiving this medicine? Side effects that you should report to your doctor or health care professional as  soon as possible:  allergic reactions like skin rash, itching or hives, swelling of the face, lips, or tongue  chest pain  diarrhea  flushing, runny nose, sweating during infusion  low blood counts - this medicine may decrease the number of white blood cells, red blood cells and platelets. You may be at increased risk for infections and bleeding.  nausea, vomiting  pain, swelling, warmth in the leg  signs of decreased platelets or bleeding - bruising, pinpoint red spots on the skin, black, tarry stools, blood in the urine  signs of infection - fever or chills, cough, sore throat, pain or difficulty passing urine  signs of decreased red blood cells - unusually weak or tired, fainting spells, lightheadedness Side effects that usually do not require medical attention (report to your doctor or health care professional if they continue or are bothersome):  constipation  hair loss  headache  loss of appetite  mouth sores  stomach pain This list may not describe all possible side effects. Call your doctor for medical advice about side effects. You may report side effects to FDA at 1-800-FDA-1088. Where should I keep my medicine? This drug is given in a hospital or clinic and will not  be stored at home. NOTE: This sheet is a summary. It may not cover all possible information. If you have questions about this medicine, talk to your doctor, pharmacist, or health care provider.  2020 Elsevier/Gold Standard (2018-11-11 10:09:17)   Fluorouracil, 5-FU injection What is this medicine? FLUOROURACIL, 5-FU (flure oh YOOR a sil) is a chemotherapy drug. It slows the growth of cancer cells. This medicine is used to treat many types of cancer like breast cancer, colon or rectal cancer, pancreatic cancer, and stomach cancer. This medicine may be used for other purposes; ask your health care provider or pharmacist if you have questions. COMMON BRAND NAME(S): Adrucil What should I tell my health care provider before I take this medicine? They need to know if you have any of these conditions:  blood disorders  dihydropyrimidine dehydrogenase (DPD) deficiency  infection (especially a virus infection such as chickenpox, cold sores, or herpes)  kidney disease  liver disease  malnourished, poor nutrition  recent or ongoing radiation therapy  an unusual or allergic reaction to fluorouracil, other chemotherapy, other medicines, foods, dyes, or preservatives  pregnant or trying to get pregnant  breast-feeding How should I use this medicine? This drug is given as an infusion or injection into a vein. It is administered in a hospital or clinic by a specially trained health care professional. Talk to your pediatrician regarding the use of this medicine in children. Special care may be needed. Overdosage: If you think you have taken too much of this medicine contact a poison control center or emergency room at once. NOTE: This medicine is only for you. Do not share this medicine with others. What if I miss a dose? It is important not to miss your dose. Call your doctor or health care professional if you are unable to keep an appointment. What may interact with this  medicine?  allopurinol  cimetidine  dapsone  digoxin  hydroxyurea  leucovorin  levamisole  medicines for seizures like ethotoin, fosphenytoin, phenytoin  medicines to increase blood counts like filgrastim, pegfilgrastim, sargramostim  medicines that treat or prevent blood clots like warfarin, enoxaparin, and dalteparin  methotrexate  metronidazole  pyrimethamine  some other chemotherapy drugs like busulfan, cisplatin, estramustine, vinblastine  trimethoprim  trimetrexate  vaccines Talk to your doctor  or health care professional before taking any of these medicines:  acetaminophen  aspirin  ibuprofen  ketoprofen  naproxen This list may not describe all possible interactions. Give your health care provider a list of all the medicines, herbs, non-prescription drugs, or dietary supplements you use. Also tell them if you smoke, drink alcohol, or use illegal drugs. Some items may interact with your medicine. What should I watch for while using this medicine? Visit your doctor for checks on your progress. This drug may make you feel generally unwell. This is not uncommon, as chemotherapy can affect healthy cells as well as cancer cells. Report any side effects. Continue your course of treatment even though you feel ill unless your doctor tells you to stop. In some cases, you may be given additional medicines to help with side effects. Follow all directions for their use. Call your doctor or health care professional for advice if you get a fever, chills or sore throat, or other symptoms of a cold or flu. Do not treat yourself. This drug decreases your body's ability to fight infections. Try to avoid being around people who are sick. This medicine may increase your risk to bruise or bleed. Call your doctor or health care professional if you notice any unusual bleeding. Be careful brushing and flossing your teeth or using a toothpick because you may get an infection or bleed  more easily. If you have any dental work done, tell your dentist you are receiving this medicine. Avoid taking products that contain aspirin, acetaminophen, ibuprofen, naproxen, or ketoprofen unless instructed by your doctor. These medicines may hide a fever. Do not become pregnant while taking this medicine. Women should inform their doctor if they wish to become pregnant or think they might be pregnant. There is a potential for serious side effects to an unborn child. Talk to your health care professional or pharmacist for more information. Do not breast-feed an infant while taking this medicine. Men should inform their doctor if they wish to father a child. This medicine may lower sperm counts. Do not treat diarrhea with over the counter products. Contact your doctor if you have diarrhea that lasts more than 2 days or if it is severe and watery. This medicine can make you more sensitive to the sun. Keep out of the sun. If you cannot avoid being in the sun, wear protective clothing and use sunscreen. Do not use sun lamps or tanning beds/booths. What side effects may I notice from receiving this medicine? Side effects that you should report to your doctor or health care professional as soon as possible:  allergic reactions like skin rash, itching or hives, swelling of the face, lips, or tongue  low blood counts - this medicine may decrease the number of white blood cells, red blood cells and platelets. You may be at increased risk for infections and bleeding.  signs of infection - fever or chills, cough, sore throat, pain or difficulty passing urine  signs of decreased platelets or bleeding - bruising, pinpoint red spots on the skin, black, tarry stools, blood in the urine  signs of decreased red blood cells - unusually weak or tired, fainting spells, lightheadedness  breathing problems  changes in vision  chest pain  mouth sores  nausea and vomiting  pain, swelling, redness at site  where injected  pain, tingling, numbness in the hands or feet  redness, swelling, or sores on hands or feet  stomach pain  unusual bleeding Side effects that usually do not  require medical attention (report to your doctor or health care professional if they continue or are bothersome):  changes in finger or toe nails  diarrhea  dry or itchy skin  hair loss  headache  loss of appetite  sensitivity of eyes to the light  stomach upset  unusually teary eyes This list may not describe all possible side effects. Call your doctor for medical advice about side effects. You may report side effects to FDA at 1-800-FDA-1088. Where should I keep my medicine? This drug is given in a hospital or clinic and will not be stored at home. NOTE: This sheet is a summary. It may not cover all possible information. If you have questions about this medicine, talk to your doctor, pharmacist, or health care provider.  2020 Elsevier/Gold Standard (2008-01-25 13:53:16)

## 2020-11-14 NOTE — Patient Instructions (Signed)

## 2020-11-15 ENCOUNTER — Encounter: Payer: Self-pay | Admitting: Oncology

## 2020-11-16 ENCOUNTER — Inpatient Hospital Stay: Payer: 59

## 2020-11-16 ENCOUNTER — Other Ambulatory Visit: Payer: Self-pay

## 2020-11-16 VITALS — BP 130/81 | HR 81 | Temp 97.9°F | Resp 19

## 2020-11-16 DIAGNOSIS — C787 Secondary malignant neoplasm of liver and intrahepatic bile duct: Secondary | ICD-10-CM

## 2020-11-16 DIAGNOSIS — Z5111 Encounter for antineoplastic chemotherapy: Secondary | ICD-10-CM | POA: Diagnosis not present

## 2020-11-16 DIAGNOSIS — C20 Malignant neoplasm of rectum: Secondary | ICD-10-CM

## 2020-11-16 MED ORDER — HEPARIN SOD (PORK) LOCK FLUSH 100 UNIT/ML IV SOLN
500.0000 [IU] | Freq: Once | INTRAVENOUS | Status: AC | PRN
Start: 2020-11-16 — End: 2020-11-16
  Administered 2020-11-16: 500 [IU]
  Filled 2020-11-16: qty 5

## 2020-11-16 MED ORDER — SODIUM CHLORIDE 0.9% FLUSH
10.0000 mL | INTRAVENOUS | Status: DC | PRN
  Administered 2020-11-16: 10 mL
  Filled 2020-11-16: qty 10

## 2020-11-16 MED ORDER — PEGFILGRASTIM-CBQV 6 MG/0.6ML ~~LOC~~ SOSY
6.0000 mg | PREFILLED_SYRINGE | Freq: Once | SUBCUTANEOUS | Status: AC
  Administered 2020-11-16: 6 mg via SUBCUTANEOUS

## 2020-11-20 ENCOUNTER — Telehealth: Payer: Self-pay | Admitting: Nurse Practitioner

## 2020-11-20 NOTE — Telephone Encounter (Signed)
Scheduled appointments per 2/10 los. Spoke to patient who is aware of appointments dates and times. Patient is aware infusion on 2/24 is scheduled in [facility]. Will mail updated calendar with [facility] location info to patient per request.

## 2020-11-24 ENCOUNTER — Other Ambulatory Visit: Payer: Self-pay | Admitting: Oncology

## 2020-11-25 ENCOUNTER — Encounter: Payer: Self-pay | Admitting: Oncology

## 2020-11-28 ENCOUNTER — Other Ambulatory Visit: Payer: Self-pay

## 2020-11-28 ENCOUNTER — Inpatient Hospital Stay (HOSPITAL_BASED_OUTPATIENT_CLINIC_OR_DEPARTMENT_OTHER): Payer: 59 | Admitting: Oncology

## 2020-11-28 ENCOUNTER — Inpatient Hospital Stay: Payer: 59

## 2020-11-28 ENCOUNTER — Other Ambulatory Visit: Payer: Self-pay | Admitting: *Deleted

## 2020-11-28 VITALS — BP 135/86 | HR 96 | Temp 97.8°F | Resp 17 | Ht 68.0 in | Wt 217.2 lb

## 2020-11-28 DIAGNOSIS — C20 Malignant neoplasm of rectum: Secondary | ICD-10-CM

## 2020-11-28 DIAGNOSIS — C787 Secondary malignant neoplasm of liver and intrahepatic bile duct: Secondary | ICD-10-CM

## 2020-11-28 DIAGNOSIS — Z95828 Presence of other vascular implants and grafts: Secondary | ICD-10-CM

## 2020-11-28 DIAGNOSIS — Z5111 Encounter for antineoplastic chemotherapy: Secondary | ICD-10-CM | POA: Diagnosis not present

## 2020-11-28 LAB — CBC WITH DIFFERENTIAL (CANCER CENTER ONLY)
Abs Immature Granulocytes: 0.45 10*3/uL — ABNORMAL HIGH (ref 0.00–0.07)
Basophils Absolute: 0.1 10*3/uL (ref 0.0–0.1)
Basophils Relative: 1 %
Eosinophils Absolute: 0.1 10*3/uL (ref 0.0–0.5)
Eosinophils Relative: 1 %
HCT: 33.7 % — ABNORMAL LOW (ref 36.0–46.0)
Hemoglobin: 11.2 g/dL — ABNORMAL LOW (ref 12.0–15.0)
Immature Granulocytes: 6 %
Lymphocytes Relative: 20 %
Lymphs Abs: 1.6 10*3/uL (ref 0.7–4.0)
MCH: 32 pg (ref 26.0–34.0)
MCHC: 33.2 g/dL (ref 30.0–36.0)
MCV: 96.3 fL (ref 80.0–100.0)
Monocytes Absolute: 0.8 10*3/uL (ref 0.1–1.0)
Monocytes Relative: 9 %
Neutro Abs: 5 10*3/uL (ref 1.7–7.7)
Neutrophils Relative %: 63 %
Platelet Count: 164 10*3/uL (ref 150–400)
RBC: 3.5 MIL/uL — ABNORMAL LOW (ref 3.87–5.11)
RDW: 17.2 % — ABNORMAL HIGH (ref 11.5–15.5)
WBC Count: 8 10*3/uL (ref 4.0–10.5)
nRBC: 0.6 % — ABNORMAL HIGH (ref 0.0–0.2)

## 2020-11-28 LAB — CMP (CANCER CENTER ONLY)
ALT: 57 U/L — ABNORMAL HIGH (ref 0–44)
AST: 39 U/L (ref 15–41)
Albumin: 3.6 g/dL (ref 3.5–5.0)
Alkaline Phosphatase: 133 U/L — ABNORMAL HIGH (ref 38–126)
Anion gap: 13 (ref 5–15)
BUN: 17 mg/dL (ref 6–20)
CO2: 24 mmol/L (ref 22–32)
Calcium: 9.1 mg/dL (ref 8.9–10.3)
Chloride: 105 mmol/L (ref 98–111)
Creatinine: 1.15 mg/dL — ABNORMAL HIGH (ref 0.44–1.00)
GFR, Estimated: 56 mL/min — ABNORMAL LOW (ref >60)
Glucose, Bld: 154 mg/dL — ABNORMAL HIGH (ref 70–99)
Potassium: 3.7 mmol/L (ref 3.5–5.1)
Sodium: 142 mmol/L (ref 135–145)
Total Bilirubin: 0.5 mg/dL (ref 0.3–1.2)
Total Protein: 7 g/dL (ref 6.5–8.1)

## 2020-11-28 MED ORDER — SODIUM CHLORIDE 0.9 % IV SOLN
5.0000 mg/kg | Freq: Once | INTRAVENOUS | Status: AC
  Administered 2020-11-28: 500 mg via INTRAVENOUS
  Filled 2020-11-28: qty 16

## 2020-11-28 MED ORDER — SODIUM CHLORIDE 0.9% FLUSH
10.0000 mL | INTRAVENOUS | Status: DC | PRN
  Administered 2020-11-28: 10 mL via INTRAVENOUS
  Filled 2020-11-28: qty 10

## 2020-11-28 MED ORDER — FLUOROURACIL CHEMO INJECTION 2.5 GM/50ML
400.0000 mg/m2 | Freq: Once | INTRAVENOUS | Status: AC
  Administered 2020-11-28: 900 mg via INTRAVENOUS
  Filled 2020-11-28: qty 18

## 2020-11-28 MED ORDER — SODIUM CHLORIDE 0.9% FLUSH
10.0000 mL | INTRAVENOUS | Status: DC | PRN
  Filled 2020-11-28: qty 10

## 2020-11-28 MED ORDER — SODIUM CHLORIDE 0.9 % IV SOLN
400.0000 mg/m2 | Freq: Once | INTRAVENOUS | Status: AC
  Administered 2020-11-28: 880 mg via INTRAVENOUS
  Filled 2020-11-28: qty 44

## 2020-11-28 MED ORDER — LIDOCAINE-PRILOCAINE 2.5-2.5 % EX CREA: 1.0000 "application " | TOPICAL_CREAM | CUTANEOUS | 1 refills | Status: DC

## 2020-11-28 MED ORDER — PALONOSETRON HCL INJECTION 0.25 MG/5ML
0.2500 mg | Freq: Once | INTRAVENOUS | Status: AC
  Administered 2020-11-28: 0.25 mg via INTRAVENOUS

## 2020-11-28 MED ORDER — ATROPINE SULFATE 1 MG/ML IJ SOLN
INTRAMUSCULAR | Status: AC
  Filled 2020-11-28: qty 1

## 2020-11-28 MED ORDER — HEPARIN SOD (PORK) LOCK FLUSH 100 UNIT/ML IV SOLN
500.0000 [IU] | Freq: Once | INTRAVENOUS | Status: DC | PRN
  Filled 2020-11-28: qty 5

## 2020-11-28 MED ORDER — SODIUM CHLORIDE 0.9 % IV SOLN
180.0000 mg/m2 | Freq: Once | INTRAVENOUS | Status: AC
  Administered 2020-11-28: 400 mg via INTRAVENOUS
  Filled 2020-11-28: qty 15

## 2020-11-28 MED ORDER — MAGIC MOUTHWASH: 5.0000 mL | Freq: Four times a day (QID) | ORAL | 1 refills | Status: DC | PRN

## 2020-11-28 MED ORDER — SODIUM CHLORIDE 0.9 % IV SOLN
Freq: Once | INTRAVENOUS | Status: AC
Start: 2020-11-28 — End: 2020-11-28
  Filled 2020-11-28: qty 250

## 2020-11-28 MED ORDER — SODIUM CHLORIDE 0.9 % IV SOLN
10.0000 mg | Freq: Once | INTRAVENOUS | Status: AC
  Administered 2020-11-28: 10 mg via INTRAVENOUS
  Filled 2020-11-28: qty 10

## 2020-11-28 MED ORDER — PALONOSETRON HCL INJECTION 0.25 MG/5ML
INTRAVENOUS | Status: AC
  Filled 2020-11-28: qty 5

## 2020-11-28 MED ORDER — SODIUM CHLORIDE 0.9 % IV SOLN
2400.0000 mg/m2 | INTRAVENOUS | Status: DC
  Administered 2020-11-28: 5300 mg via INTRAVENOUS
  Filled 2020-11-28: qty 106

## 2020-11-28 NOTE — Progress Notes (Signed)
Per Dr. Benay Spice: Refill [facility]

## 2020-11-28 NOTE — Progress Notes (Signed)
[facility] OFFICE PROGRESS NOTE   Diagnosis: Rectal cancer  INTERVAL HISTORY:   [name] completed another cycle of FOLFIRI/Avastin on 11/14/2020.  No nausea/vomiting or diarrhea.  She reports soreness on the tongue, relieved with Magic mouthwash.  An inflamed cyst in the left axilla has resolved.  No bleeding.  She completed a course of azithromycin for a "cold ".  Objective:  Vital signs in last 24 hours:  Blood pressure 135/86, pulse 96, temperature 97.8 F (36.6 C), temperature source Tympanic, resp. rate 17, height 5' 8"  (1.727 m), weight 217 lb 3.2 oz (98.5 kg), SpO2 98 %.    HEENT: No thrush or ulcers Resp: Lungs clear bilaterally Cardio: Regular rate and rhythm GI: No hepatosplenomegaly, nontender Vascular: No leg edema    Portacath/PICC-without erythema  Lab Results:  Lab Results  Component Value Date   WBC 8.0 11/28/2020   HGB 11.2 (L) 11/28/2020   HCT 33.7 (L) 11/28/2020   MCV 96.3 11/28/2020   PLT 164 11/28/2020   NEUTROABS 5.0 11/28/2020    CMP  Lab Results  Component Value Date   NA 140 11/14/2020   K 3.7 11/14/2020   CL 107 11/14/2020   CO2 26 11/14/2020   GLUCOSE 107 (H) 11/14/2020   BUN 14 11/14/2020   CREATININE 1.08 (H) 11/14/2020   CALCIUM 9.1 11/14/2020   PROT 6.9 11/14/2020   ALBUMIN 3.6 11/14/2020   AST 37 11/14/2020   ALT 54 (H) 11/14/2020   ALKPHOS 138 (H) 11/14/2020   BILITOT 0.4 11/14/2020   GFRNONAA 60 (L) 11/14/2020   GFRAA >60 06/17/2020    Lab Results  Component Value Date   CEA1 3.57 10/08/2020    Medications: I have reviewed the patient's current medications.   Assessment/Plan: 1. Rectal cancer ? 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 CTs 05/24/2018-mass beginning at proximal he 7.6 cm from the anal verge, "20 "perirectal lymph nodes, no evidence of metastatic  disease ? Pelvic MRI 06/01/2018-tumor measured at 10.8 cm from the anal sphincter, T3b, N1-2 left perirectal lymph nodes, each measuring 5 mm ? Radiation and Xeloda initiated 06/13/2018, completed 07/20/2018 ? Low anterior resection 09/16/2018,ypT3,ypN1c, 1 cm tumor, partial response-score 2, 0/13 lymph nodes positive, 2 tumor deposits, MSI-stable, no loss of mismatch repair protein expression ? Cycle 1 CAPOX 10/17/2018 ? Cycle 2 CAPOX 11/07/2018 (oxaliplatin dose reduced due to neutropenia) ? Cycle 3 CAPOX 11/28/2018 ? Cycle 4 CAPOX 12/19/2018 ? CTs 05/29/2019-no evidence of metastatic disease, severe right hydronephrosis, left thyroid nodule ? CTs 04/11/2020-subtle nodular lesion in the inferior right liver suspicious for a new metastasis, status post right ureteral resection and psoas hitch/flap, resolution of hydronephrosis, sacral insufficiency fractures bilaterally ? MRI liver 05/08/2020-2.1 cm segment 6 lesion suspicious for a metastasis, 4 mm suspicious segment 3 lesion 4 mm segment 4B lesion consistent with a hemangioma, no abdominal lymphadenopathy, cholelithiasis ? 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 MRI abdomen 08/13/2020-ablation defects in segment 6 and segment 3, tiny foci of restricted diffusion with arterial phase enhancement at the junctions of segment 4A and segment 4B, and segment 5 concerning for metastatic disease ? CTs 09/09/2020-stable ablation sites at segment 6 and segment 2. Small focus of hyperenhancement measuring 7 mm in the inferior right liver segment 6 anterior to the  ablation site, and in the anterior right liver segment 5/8-unchanged compared to MRI and concerning for metastatic disease, no other evidence of metastatic disease. Scattered irregular groundglass opacities in the posterior right upper  lobe ? Cycle 1 FOLFIRI/Avastin 09/23/2020 ? Treatment held 10/08/2020 due to neutropenia ? Cycle 2 FOLFIRI/Avastin 10/14/2020, Udenyca ? Cycle 3 FOLFIRI/Avastin 10/30/2020, Udenyca ? Cycle 4 FOLFIRI/Avastin 11/14/2020, Udenyca ? Cycle 5 FOLFIRI/Avastin 11/28/2020, Udenyca 2. History ofTobacco use 3. 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 Alyson Ingles, urology 4. Migraine headaches 5. Restless legs 6. Arm pain during the oxaliplatin infusion and for 1 week after cycle 1. The oxaliplatin will be further diluted and infusion time extended getting with cycle 2. 7. Neutropenia secondary to chemotherapy. Oxaliplatin dose reduced beginning with cycle 2. 8. 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 Right hydronephrosis on CT 05/29/2019-referred to Dr. Alyson Ingles, surgical reimplantation of the right ureter on 08/03/2019, right ureter stent removed 09/14/2019 11. Thyroid nodules-new versus enlarged left thyroid nodule CT 05/29/2019, thyroid ultrasound 06/20/2019-multiple nodules, 1 right-sided nodule met criteria for ultrasound follow-up  Ultrasound 05/08/2020-multinodular goiter, stable nodules in the isthmus and right thyroid meeting criteria for 1 year follow-up, additional stable nodules do not meet criteria for follow-up     Disposition: Norma Colon appears stable.  She is tolerating chemotherapy well.  She will complete cycle 5 FOLFIRI/Avastin today.  She will undergo a restaging CT evaluation after this cycle.  She will return for an office visit and review of the CTs prior to deciding on further therapy.  Betsy Coder, MD  11/28/2020  8:50 AM

## 2020-11-28 NOTE — Patient Instructions (Signed)
Implanted Port Insertion, Care After This sheet gives you information about how to care for yourself after your procedure. Your health care provider may also give you more specific instructions. If you have problems or questions, contact your health care provider. What can I expect after the procedure? After the procedure, it is common to have:  Discomfort at the port insertion site.  Bruising on the skin over the port. This should improve over 3-4 days. Follow these instructions at home: Port care  After your port is placed, you will get a manufacturer's information card. The card has information about your port. Keep this card with you at all times.  Take care of the port as told by your health care provider. Ask your health care provider if you or a family member can get training for taking care of the port at home. A home health care nurse may also take care of the port.  Make sure to remember what type of port you have. Incision care  Follow instructions from your health care provider about how to take care of your port insertion site. Make sure you: ? Wash your hands with soap and water before and after you change your bandage (dressing). If soap and water are not available, use hand sanitizer. ? Change your dressing as told by your health care provider. ? Leave stitches (sutures), skin glue, or adhesive strips in place. These skin closures may need to stay in place for 2 weeks or longer. If adhesive strip edges start to loosen and curl up, you may trim the loose edges. Do not remove adhesive strips completely unless your health care provider tells you to do that.  Check your port insertion site every day for signs of infection. Check for: ? Redness, swelling, or pain. ? Fluid or blood. ? Warmth. ? Pus or a bad smell.      Activity  Return to your normal activities as told by your health care provider. Ask your health care provider what activities are safe for you.  Do not  lift anything that is heavier than 10 lb (4.5 kg), or the limit that you are told, until your health care provider says that it is safe. General instructions  Take over-the-counter and prescription medicines only as told by your health care provider.  Do not take baths, swim, or use a hot tub until your health care provider approves. Ask your health care provider if you may take showers. You may only be allowed to take sponge baths.  Do not drive for 24 hours if you were given a sedative during your procedure.  Wear a medical alert bracelet in case of an emergency. This will tell any health care providers that you have a port.  Keep all follow-up visits as told by your health care provider. This is important. Contact a health care provider if:  You cannot flush your port with saline as directed, or you cannot draw blood from the port.  You have a fever or chills.  You have redness, swelling, or pain around your port insertion site.  You have fluid or blood coming from your port insertion site.  Your port insertion site feels warm to the touch.  You have pus or a bad smell coming from the port insertion site. Get help right away if:  You have chest pain or shortness of breath.  You have bleeding from your port that you cannot control. Summary  Take care of the port as told by your   health care provider. Keep the manufacturer's information card with you at all times.  Change your dressing as told by your health care provider.  Contact a health care provider if you have a fever or chills or if you have redness, swelling, or pain around your port insertion site.  Keep all follow-up visits as told by your health care provider. This information is not intended to replace advice given to you by your health care provider. Make sure you discuss any questions you have with your health care provider. Document Revised: 04/19/2018 Document Reviewed: 04/19/2018 Elsevier Patient Education   2021 [employer].  

## 2020-11-28 NOTE — Patient Instructions (Signed)
[facility] Discharge Instructions for Patients Receiving Chemotherapy  Today you received the following chemotherapy agents Bevacizumab, Irinotecan, Leukovorin, Florouricil,  To help prevent nausea and vomiting after your treatment, we encourage you to take your nausea medication as directed.   If you develop nausea and vomiting that is not controlled by your nausea medication, call the clinic.   BELOW ARE SYMPTOMS THAT SHOULD BE REPORTED IMMEDIATELY:  *FEVER GREATER THAN 100.5 F  *CHILLS WITH OR WITHOUT FEVER  NAUSEA AND VOMITING THAT IS NOT CONTROLLED WITH YOUR NAUSEA MEDICATION  *UNUSUAL SHORTNESS OF BREATH  *UNUSUAL BRUISING OR BLEEDING  TENDERNESS IN MOUTH AND THROAT WITH OR WITHOUT PRESENCE OF ULCERS  *URINARY PROBLEMS  *BOWEL PROBLEMS  UNUSUAL RASH Items with * indicate a potential emergency and should be followed up as soon as possible.  Feel free to call the clinic should you have any questions or concerns. The clinic phone number is [phone].  Please show the [facility] at check-in to the Emergency Department and triage nurse.

## 2020-11-29 ENCOUNTER — Telehealth: Payer: Self-pay | Admitting: Oncology

## 2020-11-29 NOTE — Telephone Encounter (Signed)
Scheduled appointment per 2/24 los. Spoke to patient who is aware of appointment date and time.

## 2020-11-30 ENCOUNTER — Inpatient Hospital Stay: Payer: 59

## 2020-11-30 ENCOUNTER — Other Ambulatory Visit: Payer: Self-pay

## 2020-11-30 VITALS — BP 135/88 | HR 102 | Temp 97.2°F | Resp 20

## 2020-11-30 DIAGNOSIS — C20 Malignant neoplasm of rectum: Secondary | ICD-10-CM

## 2020-11-30 DIAGNOSIS — C787 Secondary malignant neoplasm of liver and intrahepatic bile duct: Secondary | ICD-10-CM

## 2020-11-30 DIAGNOSIS — Z5111 Encounter for antineoplastic chemotherapy: Secondary | ICD-10-CM | POA: Diagnosis not present

## 2020-11-30 MED ORDER — SODIUM CHLORIDE 0.9% FLUSH
10.0000 mL | INTRAVENOUS | Status: DC | PRN
  Administered 2020-11-30: 10 mL
  Filled 2020-11-30: qty 10

## 2020-11-30 MED ORDER — HEPARIN SOD (PORK) LOCK FLUSH 100 UNIT/ML IV SOLN
500.0000 [IU] | Freq: Once | INTRAVENOUS | Status: AC | PRN
  Administered 2020-11-30: 500 [IU]
  Filled 2020-11-30: qty 5

## 2020-11-30 MED ORDER — PEGFILGRASTIM-CBQV 6 MG/0.6ML ~~LOC~~ SOSY
6.0000 mg | PREFILLED_SYRINGE | Freq: Once | SUBCUTANEOUS | Status: AC
  Administered 2020-11-30: 6 mg via SUBCUTANEOUS

## 2020-12-05 ENCOUNTER — Telehealth: Payer: Self-pay | Admitting: *Deleted

## 2020-12-05 NOTE — Telephone Encounter (Signed)
Left VM requesting return call. Called back and she reports that ever since pump d/c on 2/26, she has no energy. Wants to sleep all the time. Says "I feel like shit". No fever or N/V on urinary or respiratory symptoms. Not taking anti-emetic or pain meds and no bleeding. Asking if she could be dehydrated. She drinks about 4 protein drinks/day and 2 bottles of water and is eating. Informed her she is not dehydrated with that much po intake. RN suggested she may just need that much time to recover from 5 cycles of treatment.

## 2020-12-08 ENCOUNTER — Other Ambulatory Visit: Payer: Self-pay | Admitting: Oncology

## 2020-12-11 ENCOUNTER — Other Ambulatory Visit: Payer: Self-pay

## 2020-12-11 ENCOUNTER — Ambulatory Visit (HOSPITAL_COMMUNITY)
Admission: RE | Admit: 2020-12-11 | Discharge: 2020-12-11 | Disposition: A | Discharge status: Home / Self Care | Payer: 59 | Source: Ambulatory Visit | Attending: Oncology | Admitting: Oncology

## 2020-12-11 ENCOUNTER — Encounter (HOSPITAL_COMMUNITY): Payer: Self-pay

## 2020-12-11 DIAGNOSIS — C20 Malignant neoplasm of rectum: Secondary | ICD-10-CM | POA: Insufficient documentation

## 2020-12-11 DIAGNOSIS — C787 Secondary malignant neoplasm of liver and intrahepatic bile duct: Secondary | ICD-10-CM | POA: Diagnosis present

## 2020-12-11 MED ORDER — IOHEXOL 300 MG/ML  SOLN
100.0000 mL | Freq: Once | INTRAMUSCULAR | Status: AC | PRN
  Administered 2020-12-11: 100 mL via INTRAVENOUS

## 2020-12-12 ENCOUNTER — Other Ambulatory Visit: Payer: Self-pay

## 2020-12-12 ENCOUNTER — Ambulatory Visit: Payer: 59 | Admitting: Nurse Practitioner

## 2020-12-12 ENCOUNTER — Inpatient Hospital Stay: Payer: 59 | Attending: Oncology | Admitting: Oncology

## 2020-12-12 ENCOUNTER — Ambulatory Visit: Payer: 59

## 2020-12-12 ENCOUNTER — Other Ambulatory Visit: Payer: 59

## 2020-12-12 VITALS — BP 142/78 | HR 93 | Temp 97.8°F | Resp 18 | Ht 68.0 in | Wt 213.1 lb

## 2020-12-12 DIAGNOSIS — Z9221 Personal history of antineoplastic chemotherapy: Secondary | ICD-10-CM | POA: Diagnosis not present

## 2020-12-12 DIAGNOSIS — C787 Secondary malignant neoplasm of liver and intrahepatic bile duct: Secondary | ICD-10-CM | POA: Diagnosis not present

## 2020-12-12 DIAGNOSIS — C20 Malignant neoplasm of rectum: Secondary | ICD-10-CM | POA: Diagnosis present

## 2020-12-12 DIAGNOSIS — Z923 Personal history of irradiation: Secondary | ICD-10-CM | POA: Diagnosis not present

## 2020-12-12 NOTE — Progress Notes (Signed)
[facility] OFFICE PROGRESS NOTE   Diagnosis: Rectal cancer  INTERVAL HISTORY:   Norma Colon completed another cycle of FOLFIRI/Avastin on 11/28/2020.  She reports increased malaise following this cycle of chemotherapy.  She had diarrhea and mild nosebleeding.  Objective:  Vital signs in last 24 hours:  Blood pressure (!) 142/78, pulse 93, temperature 97.8 F (36.6 C), temperature source Tympanic, resp. rate 18, height 5' 8"  (1.727 m), weight 213 lb 1.6 oz (96.7 kg), SpO2 97 %.    HEENT: No thrush or ulcers Lymphatics: No cervical, supraclavicular, axillary, or inguinal nodes Resp: Lungs clear bilaterally Cardio: Regular rate and rhythm GI: No mass, nontender, no hepatosplenomegaly Vascular: No leg edema   Portacath/PICC-without erythema  Lab Results:  Lab Results  Component Value Date   WBC 8.0 11/28/2020   HGB 11.2 (L) 11/28/2020   HCT 33.7 (L) 11/28/2020   MCV 96.3 11/28/2020   PLT 164 11/28/2020   NEUTROABS 5.0 11/28/2020    CMP  Lab Results  Component Value Date   NA 142 11/28/2020   K 3.7 11/28/2020   CL 105 11/28/2020   CO2 24 11/28/2020   GLUCOSE 154 (H) 11/28/2020   BUN 17 11/28/2020   CREATININE 1.15 (H) 11/28/2020   CALCIUM 9.1 11/28/2020   PROT 7.0 11/28/2020   ALBUMIN 3.6 11/28/2020   AST 39 11/28/2020   ALT 57 (H) 11/28/2020   ALKPHOS 133 (H) 11/28/2020   BILITOT 0.5 11/28/2020   GFRNONAA 56 (L) 11/28/2020   GFRAA >60 06/17/2020    Lab Results  Component Value Date   CEA1 3.57 10/08/2020    Lab Results  Component Value Date   INR 1.1 07/11/2020    Imaging: CT images from 12/11/2020 reviewed with Norma Colon  Medications: I have reviewed the patient's current medications.   Assessment/Plan: 1. Rectal cancer ? 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 CTs 05/24/2018-mass beginning at  proximal he 7.6 cm from the anal verge, "20 "perirectal lymph nodes, no evidence of metastatic disease ? Pelvic MRI 06/01/2018-tumor measured at 10.8 cm from the anal sphincter, T3b, N1-2 left perirectal lymph nodes, each measuring 5 mm ? Radiation and Xeloda initiated 06/13/2018, completed 07/20/2018 ? Low anterior resection 09/16/2018,ypT3,ypN1c, 1 cm tumor, partial response-score 2, 0/13 lymph nodes positive, 2 tumor deposits, MSI-stable, no loss of mismatch repair protein expression ? Cycle 1 CAPOX 10/17/2018 ? Cycle 2 CAPOX 11/07/2018 (oxaliplatin dose reduced due to neutropenia) ? Cycle 3 CAPOX 11/28/2018 ? Cycle 4 CAPOX 12/19/2018 ? CTs 05/29/2019-no evidence of metastatic disease, severe right hydronephrosis, left thyroid nodule ? CTs 04/11/2020-subtle nodular lesion in the inferior right liver suspicious for a new metastasis, status post right ureteral resection and psoas hitch/flap, resolution of hydronephrosis, sacral insufficiency fractures bilaterally ? MRI liver 05/08/2020-2.1 cm segment 6 lesion suspicious for a metastasis, 4 mm suspicious segment 3 lesion 4 mm segment 4B lesion consistent with a hemangioma, no abdominal lymphadenopathy, cholelithiasis ? 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 MRI abdomen 08/13/2020-ablation defects in segment 6 and segment 3, tiny foci of restricted diffusion with arterial phase enhancement at the junctions of segment 4A and segment 4B, and segment 5 concerning for metastatic disease ? CTs 09/09/2020-stable ablation sites at segment 6 and segment 2. Small focus of hyperenhancement measuring 7  mm in the inferior right liver segment 6 anterior to the ablation site, and in the anterior right liver segment 5/8-unchanged compared to MRI and concerning for metastatic disease, no other evidence of  metastatic disease. Scattered irregular groundglass opacities in the posterior right upper lobe ? Cycle 1 FOLFIRI/Avastin 09/23/2020 ? Treatment held 10/08/2020 due to neutropenia ? Cycle 2 FOLFIRI/Avastin 10/14/2020, Udenyca ? Cycle 3 FOLFIRI/Avastin 10/30/2020, Udenyca ? Cycle 4 FOLFIRI/Avastin 11/14/2020, Udenyca ? Cycle 5 FOLFIRI/Avastin 11/28/2020, Udenyca ? CTs 12/11/2020-no evidence of metastatic disease in the chest, stable liver ablation sites, stable hypervascular lesion in segment 4A, previously described enhancing lesion in the right hepatic lobe not well appreciated, no new lesions 2. History ofTobacco use 3. 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 Alyson Ingles, urology 4. Migraine headaches 5. Restless legs 6. Arm pain during the oxaliplatin infusion and for 1 week after cycle 1. The oxaliplatin will be further diluted and infusion time extended getting with cycle 2. 7. Neutropenia secondary to chemotherapy. Oxaliplatin dose reduced beginning with cycle 2. 8. 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 Right hydronephrosis on CT 05/29/2019-referred to Dr. Alyson Ingles, surgical reimplantation of the right ureter on 08/03/2019, right ureter stent removed 09/14/2019 11. Thyroid nodules-new versus enlarged left thyroid nodule CT 05/29/2019, thyroid ultrasound 06/20/2019-multiple nodules, 1 right-sided nodule met criteria for ultrasound follow-up  Ultrasound 05/08/2020-multinodular goiter, stable nodules in the isthmus and right thyroid meeting criteria for 1 year follow-up, additional stable nodules do not meet criteria for follow-up      Disposition: Norma Colon has completed 5 cycles of  FOLFIRI/Avastin.  She has tolerated the treatment well.  The restaging CTs revealed no evidence of disease progression.  I reviewed the images with her.  She appears to have 2 remaining small lesions by CT.  We discussed treatment options including continuation of FOLFIRI/Avastin, a treatment break, and referral to Dr. Laurence Ferrari to consider hepatic ablation.  She would like to proceed with ablation if this is possible.  She understands it is very unlikely hepatic directed therapy will be curative.  I will contact Dr. Laurence Ferrari to discuss the feasibility of ablating the remaining liver lesions.  She will return for an office visit in 1 month.  We will see her sooner depending on the decision for hepatic ablation.  I will present her case at the GI tumor conference.  Betsy Coder, MD  12/12/2020  9:52 AM

## 2020-12-13 ENCOUNTER — Other Ambulatory Visit: Payer: Self-pay | Admitting: Interventional Radiology

## 2020-12-13 DIAGNOSIS — K769 Liver disease, unspecified: Secondary | ICD-10-CM

## 2020-12-18 ENCOUNTER — Encounter: Payer: Self-pay | Admitting: Radiology

## 2020-12-18 ENCOUNTER — Ambulatory Visit
Admission: RE | Admit: 2020-12-18 | Discharge: 2020-12-18 | Disposition: A | Discharge status: Home / Self Care | Payer: 59 | Source: Ambulatory Visit | Attending: Interventional Radiology | Admitting: Interventional Radiology

## 2020-12-18 DIAGNOSIS — K769 Liver disease, unspecified: Secondary | ICD-10-CM

## 2020-12-18 HISTORY — PX: IR RADIOLOGIST EVAL & MGMT: IMG5224

## 2020-12-18 NOTE — Progress Notes (Signed)
Chief Complaint: Patient was seen in consultation today for colon cancer metastatic to the liver at the request of Beila Purdie K  Referring Physician(s): Dr. Julieanne Manson  History of Present Illness: Norma Colon is a 60 y.o. female who initially presentedat the kind request of Dr. 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 Norma Colon was initially diagnosed with a partially obstructing rectal mass in August 2019. She underwent neoadjuvant combined radiation and chemotherapy (Xeloda) from September through October 2019 followed by a low anterior resection in December 2019.   Subsequently, she underwent 4 cycles of additional adjuvant chemotherapy with CAPOX completed in March 2020. Subsequent surveillance imaging initially detected no evidence of metastatic disease. Unfortunately, CT imaging from July 2021 demonstrated a subtle lesion in hepatic segment 6 concerning for metastatic disease. A follow-up MRI dated 05/08/2020 confirms a 2.1 cm solid lesion in hepatic segment 6 as well as a concerning for millimeter lesion in the posterior aspect of segment 3. Both lesions are concerning for metastatic disease.  She underwent concurrent ultrasound-guided biopsy and microwave ablation of the 2 liver lesions on 06/19/2020 and presents today for follow-up. She had an MRI of the abdomen with gadolinium contrast on 08/13/2020.  Follow-up MRI imaging from 08/13/20 showed an excellent treatment effect at the sites of the 2 ablated lesions, but 2 new lesions highly concerning for additional metastatic foci.  Norma Colon therefore went back for additional chemotherapy and has now completed another cycle of FOLFIRI/Avastin on 11/28/2020.  She reported increased malaise following this cycle of chemotherapy.  She had diarrhea and mild nosebleeding.  Restaging CT scans of the chest, abdomen and pelvis dated 12/12/2020 demonstrates  no evidence of new metastatic disease or disease outside the liver.  The previously identified tiny hypervascular lesions in segment 4A and segment 6 are less conspicuous and perhaps slightly smaller consistent with interval treatment response.  She is otherwise doing well and looking forward to going to the beach tomorrow.  Past Medical History:  Diagnosis Date  . Adenocarcinoma of rectum [facility]) oncologist-- dr Benay Spice--  per last note in epic ,  clinical remission   dx 08/ 2019---- chemoradiation concurrent completed 07-20-2018;  s/p  low anterior resection 09-16-2018;   chemo 10-17-2018  ot 12-19-2018  . Anxiety   . Arthritis   . Chemotherapy induced neutropenia ([place])   . Depression   . Headache    migraines   . History of HPV infection   . History of kidney stones   . Hydronephrosis, right   . Restless leg syndrome   . Scoliosis   . Sepsis ([place])    11-2018    Past Surgical History:  Procedure Laterality Date  . COLPOSCOPY  04/04/2018  . CYSTOSCOPY WITH RETROGRADE PYELOGRAM, URETEROSCOPY AND STENT PLACEMENT Right 12/12/2018   Procedure: CYSTOSCOPY WITH RETROGRADE PYELOGRAM, URETEROSCOPY, STONE EXTRACTION AND STENT PLACEMENT;  Surgeon: Cleon Gustin, MD;  Location: [facility];  Service: Urology;  Laterality: Right;  . CYSTOSCOPY WITH RETROGRADE PYELOGRAM, URETEROSCOPY AND STENT PLACEMENT Right 04/17/2019   Procedure: CYSTOSCOPY WITH RETROGRADE PYELOGRAM, BALLOON [place]  URETEROSCOPY AND STENT PLACEMENT;  Surgeon: Cleon Gustin, MD;  Location: [facility];  Service: Urology;  Laterality: Right;  . CYSTOSCOPY WITH STENT PLACEMENT Right 11/21/2018   Procedure: CYSTOSCOPY, RIGHT RETROGRADE PYELOGRAM, WITH RIGHT URETERAL STENT PLACEMENT;  Surgeon: Cleon Gustin, MD;  Location: [facility];  Service: Urology;  Laterality: Right;  . IR IMAGING  GUIDED PORT INSERTION  09/04/2020  . IR NEPHROSTOMY PLACEMENT RIGHT  06/22/2019  . IR PATIENT  EVAL TECH 0-60 MINS  06/28/2019  . IR RADIOLOGIST EVAL & MGMT  06/06/2020  . IR RADIOLOGIST EVAL & MGMT  07/17/2020  . IR RADIOLOGIST EVAL & MGMT  08/15/2020  . IR RADIOLOGIST EVAL & MGMT  09/03/2020  . IR RADIOLOGIST EVAL & MGMT  12/18/2020  . left forearm surgery due to calcium rock     . RADIOLOGY WITH ANESTHESIA N/A 06/19/2020   Procedure: CT WITH ANESTHESIA MICROWAVE ABLATION;  Surgeon: Jacqulynn Cadet, MD;  Location: [facility];  Service: Anesthesiology;  Laterality: N/A;  . ROBOT ASSISTED PYELOPLASTY Right 08/03/2019   Procedure: XI ROBOTIC ASSISTED PYELOPLASTY WITH STENT PLACEMENT;  Surgeon: Cleon Gustin, MD;  Location: [facility];  Service: Urology;  Laterality: Right;  3 HRS  . SHOULDER ARTHROSCOPY W/ ROTATOR CUFF REPAIR Right   . XI ROBOTIC ASSISTED LOWER ANTERIOR RESECTION N/A 09/16/2018   Procedure: XI ROBOTIC ASSISTED LOWER ANTERIOR RESECTION ERAS PATHWAY;  Surgeon: Leighton Ruff, MD;  Location: [facility];  Service: General;  Laterality: N/A;    Allergies: Prednisone and Codeine  Medications: Prior to Admission medications   Medication Sig Start Date End Date Taking? Authorizing Provider  alprazolam Duanne Moron) 2 MG tablet Take 2 mg by mouth every morning.     [provider]  Ascorbic Acid (VITAMIN C PO) Take 1 tablet by mouth every morning.     [provider]  aspirin EC 81 MG tablet Take 81 mg by mouth every morning. Swallow whole.     [provider]  BIOTIN PO Take 10,000 mcg by mouth every morning.    [provider]  buPROPion (WELLBUTRIN XL) 300 MG 24 hr tablet Take 300 mg by mouth every morning.     [provider]  Cholecalciferol (VITAMIN D-3 PO) Take 5,000 Units by mouth daily.    [provider]  cyclobenzaprine (FLEXERIL) 10 MG tablet Take 10 mg by mouth at bedtime.    [provider]  diphenoxylate-atropine (LOMOTIL) 2.5-0.025 MG tablet Take 1-2 tablets by mouth 4 (four) times daily as needed for diarrhea  or loose stools. 11/14/20   Owens Shark, NP  HYDROcodone-acetaminophen (NORCO) 10-325 MG tablet Take 1-2 tablets by mouth every 6 (six) hours as needed for moderate pain or severe pain (headache). Post-Operatively. Patient taking differently: Take 1-2 tablets by mouth every 4 (four) hours as needed for moderate pain or severe pain (headache). Post-Operatively. 08/05/19   Alexis Frock, MD  lidocaine-prilocaine (EMLA) cream Apply 1 application topically as directed. Apply 1-2 hours prior to IV stick and cover with plastic wrap 11/28/20   Ladell Pier, MD  magic mouthwash SOLN Take 5-10 mLs by mouth 4 (four) times daily as needed for mouth pain. Swish and spit Patient not taking: Reported on 12/12/2020 11/28/20   Ladell Pier, MD  Magnesium 500 MG CAPS Take 500 mg by mouth daily.    [provider]  ondansetron (ZOFRAN) 8 MG tablet Take 1 tablet (8 mg total) by mouth every 8 (eight) hours as needed for nausea or vomiting. Start 72 hours after each IV chemo treatment 09/12/20   Ladell Pier, MD  Probiotic Product (PROBIOTIC PO) Take 1 tablet by mouth every morning. Acidophilus 100 million    [provider]  prochlorperazine (COMPAZINE) 10 MG tablet TAKE 1 TABLET(10 MG) BY MOUTH EVERY 6 HOURS AS NEEDED FOR NAUSEA Patient not taking: Reported  on 12/12/2020 10/30/20   Owens Shark, NP  rOPINIRole (REQUIP) 0.25 MG tablet Take 0.25 mg by mouth at bedtime.    [provider]     Family History  Problem Relation Age of Onset  . COPD Father        smoker  . Stomach cancer Father        spread to lungs and bones  . Arthritis Mother   . Heart attack Paternal Grandmother   . Heart attack Paternal Grandfather   . Heart attack Maternal Grandmother   . Stroke Maternal Grandmother   . Colon cancer Neg Hx   . Rectal cancer Neg Hx   . Esophageal cancer Neg Hx     Social History   Socioeconomic History  . Marital status: Legally Separated    Spouse name: Not on  file  . Number of children: 1  . Years of education: Not on file  . Highest education level: Not on file  Occupational History  . Occupation: Land  Tobacco Use  . Smoking status: Former Smoker    Packs/day: 1.00    Years: 30.00    Pack years: 30.00    Types: Cigarettes    Quit date: 09/16/2018    Years since quitting: 2.2  . Smokeless tobacco: Never Used  Vaping Use  . Vaping Use: Never used  Substance and Sexual Activity  . Alcohol use: Yes    Comment: social  . Drug use: Never  . Sexual activity: Yes  Other Topics Concern  . Not on file  Social History Narrative  . Not on file   Social Determinants of Health   Financial Resource Strain: Not on file  Food Insecurity: Not on file  Transportation Needs: Not on file  Physical Activity: Not on file  Stress: Not on file  Social Connections: Not on file    ECOG Status: 0 - Asymptomatic  Review of Systems: A 12 point ROS discussed and pertinent positives are indicated in the HPI above.  All other systems are negative.  Review of Systems  Vital Signs: LMP  (LMP Unknown)   Physical Exam Exam conducted with a chaperone present.  Constitutional:      Appearance: Normal appearance.  HENT:     Head: Normocephalic and atraumatic.  Eyes:     General: No scleral icterus. Cardiovascular:     Rate and Rhythm: Normal rate.  Pulmonary:     Effort: Pulmonary effort is normal.  Abdominal:     General: Abdomen is flat. There is no distension.     Palpations: Abdomen is soft.  Skin:    General: Skin is warm and dry.  Neurological:     Mental Status: She is alert and oriented to person, place, and time.  Psychiatric:        Mood and Affect: Mood normal.        Behavior: Behavior normal.      Imaging: CT CHEST W CONTRAST  Result Date: 12/12/2020 CLINICAL DATA:  Colorectal carcinoma. Assess treatment response. Rectal staging. EXAM: CT CHEST, ABDOMEN, AND PELVIS WITH CONTRAST TECHNIQUE: Multidetector CT imaging  of the chest, abdomen and pelvis was performed following the standard protocol during bolus administration of intravenous contrast. CONTRAST:  156mL OMNIPAQUE IOHEXOL 300 MG/ML  SOLN COMPARISON:  CT 09/09/2020, MRI 08/13/2020 FINDINGS: CT CHEST FINDINGS Cardiovascular: Port in the anterior chest wall with tip in distal SVC. Mediastinum/Nodes: No axillary or supraclavicular adenopathy. No mediastinal or hilar adenopathy. No pericardial fluid. Esophagus  normal. Lungs/Pleura: No suspicious pulmonary nodules. Normal pleural. Airways normal. Musculoskeletal: No aggressive osseous lesion. CT ABDOMEN AND PELVIS FINDINGS Hepatobiliary: Low-density lesion in the posterior aspect of the RIGHT hepatic lobe measuring 3.0 cm compared to 3.4 cm. This is in the ablation site by report. No new nodularity or enhancement compared to prior. Second ablation site in the posterior aspect of the lateral LEFT hepatic lobe measuring 1.3 cm is also unchanged. Small hypervascular lesion in the central liver (segment 4A) measuring 7 mm is unchanged from 8 mm (image 65/series 2) no new enhancing lesions are present. A previous described enhancing lesion in the RIGHT hepatic lobe is not well appreciated. No new enhancing lesions. The gallbladder is contracted around gallstones. Pancreas: Pancreas is normal. No ductal dilatation. No pancreatic inflammation. Spleen: Normal spleen Adrenals/urinary tract: Adrenal glands and kidneys are normal. The ureters and bladder normal. Stomach/Bowel: Stomach, small bowel, appendix, and cecum are normal. Low rectal anastomosis. No obstruction. Mesenteric lymphadenopathy. Vascular/Lymphatic: Abdominal aorta is normal caliber. There is no retroperitoneal or periportal lymphadenopathy. No pelvic lymphadenopathy. Reproductive: Uterus and adnexa unremarkable. Other: No peritoneal metastasis Musculoskeletal: No aggressive osseous lesion. IMPRESSION: Chest Impression: 1. No thoracic metastasis. Abdomen / Pelvis  Impression: 1. Stable ablation sites within LEFT and RIGHT hepatic lobe. 2. Stable small enhancing lesion in the central liver, favored benign vascular phenomena. No new or progressive disease within the liver. 3. Low rectal anastomosis without evidence local recurrence. No abdominopelvic lymphadenopathy. Electronically Signed   By: Suzy Bouchard M.D.   On: 12/12/2020 09:01   CT ABDOMEN PELVIS W CONTRAST  Result Date: 12/12/2020 CLINICAL DATA:  Colorectal carcinoma. Assess treatment response. Rectal staging. EXAM: CT CHEST, ABDOMEN, AND PELVIS WITH CONTRAST TECHNIQUE: Multidetector CT imaging of the chest, abdomen and pelvis was performed following the standard protocol during bolus administration of intravenous contrast. CONTRAST:  137mL OMNIPAQUE IOHEXOL 300 MG/ML  SOLN COMPARISON:  CT 09/09/2020, MRI 08/13/2020 FINDINGS: CT CHEST FINDINGS Cardiovascular: Port in the anterior chest wall with tip in distal SVC. Mediastinum/Nodes: No axillary or supraclavicular adenopathy. No mediastinal or hilar adenopathy. No pericardial fluid. Esophagus normal. Lungs/Pleura: No suspicious pulmonary nodules. Normal pleural. Airways normal. Musculoskeletal: No aggressive osseous lesion. CT ABDOMEN AND PELVIS FINDINGS Hepatobiliary: Low-density lesion in the posterior aspect of the RIGHT hepatic lobe measuring 3.0 cm compared to 3.4 cm. This is in the ablation site by report. No new nodularity or enhancement compared to prior. Second ablation site in the posterior aspect of the lateral LEFT hepatic lobe measuring 1.3 cm is also unchanged. Small hypervascular lesion in the central liver (segment 4A) measuring 7 mm is unchanged from 8 mm (image 65/series 2) no new enhancing lesions are present. A previous described enhancing lesion in the RIGHT hepatic lobe is not well appreciated. No new enhancing lesions. The gallbladder is contracted around gallstones. Pancreas: Pancreas is normal. No ductal dilatation. No pancreatic  inflammation. Spleen: Normal spleen Adrenals/urinary tract: Adrenal glands and kidneys are normal. The ureters and bladder normal. Stomach/Bowel: Stomach, small bowel, appendix, and cecum are normal. Low rectal anastomosis. No obstruction. Mesenteric lymphadenopathy. Vascular/Lymphatic: Abdominal aorta is normal caliber. There is no retroperitoneal or periportal lymphadenopathy. No pelvic lymphadenopathy. Reproductive: Uterus and adnexa unremarkable. Other: No peritoneal metastasis Musculoskeletal: No aggressive osseous lesion. IMPRESSION: Chest Impression: 1. No thoracic metastasis. Abdomen / Pelvis Impression: 1. Stable ablation sites within LEFT and RIGHT hepatic lobe. 2. Stable small enhancing lesion in the central liver, favored benign vascular phenomena. No new or progressive disease within the liver.  3. Low rectal anastomosis without evidence local recurrence. No abdominopelvic lymphadenopathy. Electronically Signed   By: Suzy Bouchard M.D.   On: 12/12/2020 09:01   IR Radiologist Eval & Mgmt  Result Date: 12/18/2020 Please refer to notes tab for details about interventional procedure. (Op Note)   Labs:  CBC: Recent Labs    10/14/20 1314 10/30/20 1054 11/14/20 0908 11/28/20 0812  WBC 4.5 6.7 9.7 8.0  HGB 12.8 12.4 11.0* 11.2*  HCT 38.2 37.6 33.0* 33.7*  PLT 256 179 158 164    COAGS: Recent Labs    06/19/20 0715 07/11/20 1054  INR 1.1 1.1    BMP: Recent Labs    04/11/20 1102 06/17/20 1513 07/11/20 1052 10/14/20 1314 10/30/20 1054 11/14/20 0908 11/28/20 0812  NA 142 139   < > 141 142 140 142  K 4.1 4.7   < > 4.4 3.9 3.7 3.7  CL 104 103   < > 107 107 107 105  CO2 26 27   < > 24 24 26 24   GLUCOSE 103* 95   < > 109* 140* 107* 154*  BUN 14 22*   < > 16 16 14 17   CALCIUM 9.7 9.5   < > 9.9 9.1 9.1 9.1  CREATININE 1.21* 0.88   < > 1.07* 1.34* 1.08* 1.15*  GFRNONAA 50* >60   < > >60 46* 60* 56*  GFRAA 58* >60  --   --   --   --   --    < > = values in this interval  not displayed.    LIVER FUNCTION TESTS: Recent Labs    10/14/20 1314 10/30/20 1054 11/14/20 0908 11/28/20 0812  BILITOT 0.6 0.5 0.4 0.5  AST 54* 38 37 39  ALT 56* 51* 54* 57*  ALKPHOS 119 135* 138* 133*  PROT 7.8 7.4 6.9 7.0  ALBUMIN 3.8 3.7 3.6 3.6    TUMOR MARKERS: No results for input(s): AFPTM, CEA, CA199, CHROMGRNA in the last 8760 hours.  Assessment and Plan:  Norma Colon is doing very well following completion of her chemotherapy.  Good treatment response on her follow-up surveillance CT scans.  One of the lesions is barely detectable while the other is slightly smaller in size.  No evidence of new disease.  Today, I used real-time ultrasound to screen her entire liver looking very carefully for any visible lesions in the region of the 2 previously identified lesions.  None are visible sonographically.  This is very encouraging news.  She is currently not a candidate for interval ablation given the fact that the lesions are not visible sonographically and are too small to target with CT guidance.  1.)  Return clinic visit with repeat physician performed ultrasound in 3 months to take 1 more look to see if these lesions become visible enough to pursue ablation.    Electronically Signed: Criselda Peaches 12/18/2020, 10:14 AM   I spent a total of 15 Minutes in face to face in clinical consultation, greater than 50% of which was counseling/coordinating care for colon cancer metastatic to the liver.

## 2021-01-13 ENCOUNTER — Inpatient Hospital Stay: Payer: 59

## 2021-01-13 ENCOUNTER — Telehealth: Payer: Self-pay | Admitting: Oncology

## 2021-01-13 ENCOUNTER — Inpatient Hospital Stay: Payer: 59 | Attending: Oncology | Admitting: Oncology

## 2021-01-13 ENCOUNTER — Other Ambulatory Visit: Payer: Self-pay

## 2021-01-13 VITALS — BP 136/88 | HR 102 | Temp 98.3°F | Resp 18

## 2021-01-13 VITALS — BP 136/88 | HR 102 | Temp 98.3°F | Resp 18 | Wt 211.2 lb

## 2021-01-13 DIAGNOSIS — G2581 Restless legs syndrome: Secondary | ICD-10-CM | POA: Diagnosis not present

## 2021-01-13 DIAGNOSIS — G43909 Migraine, unspecified, not intractable, without status migrainosus: Secondary | ICD-10-CM | POA: Insufficient documentation

## 2021-01-13 DIAGNOSIS — C787 Secondary malignant neoplasm of liver and intrahepatic bile duct: Secondary | ICD-10-CM | POA: Insufficient documentation

## 2021-01-13 DIAGNOSIS — Z95828 Presence of other vascular implants and grafts: Secondary | ICD-10-CM

## 2021-01-13 DIAGNOSIS — E042 Nontoxic multinodular goiter: Secondary | ICD-10-CM | POA: Insufficient documentation

## 2021-01-13 DIAGNOSIS — C20 Malignant neoplasm of rectum: Secondary | ICD-10-CM | POA: Diagnosis present

## 2021-01-13 LAB — CBC WITH DIFFERENTIAL (CANCER CENTER ONLY)
Abs Immature Granulocytes: 0.01 10*3/uL (ref 0.00–0.07)
Basophils Absolute: 0.1 10*3/uL (ref 0.0–0.1)
Basophils Relative: 1 %
Eosinophils Absolute: 0.4 10*3/uL (ref 0.0–0.5)
Eosinophils Relative: 6 %
HCT: 38.4 % (ref 36.0–46.0)
Hemoglobin: 12.5 g/dL (ref 12.0–15.0)
Immature Granulocytes: 0 %
Lymphocytes Relative: 18 %
Lymphs Abs: 1.1 10*3/uL (ref 0.7–4.0)
MCH: 33 pg (ref 26.0–34.0)
MCHC: 32.6 g/dL (ref 30.0–36.0)
MCV: 101.3 fL — ABNORMAL HIGH (ref 80.0–100.0)
Monocytes Absolute: 0.5 10*3/uL (ref 0.1–1.0)
Monocytes Relative: 8 %
Neutro Abs: 4 10*3/uL (ref 1.7–7.7)
Neutrophils Relative %: 67 %
Platelet Count: 182 10*3/uL (ref 150–400)
RBC: 3.79 MIL/uL — ABNORMAL LOW (ref 3.87–5.11)
RDW: 13.8 % (ref 11.5–15.5)
WBC Count: 6 10*3/uL (ref 4.0–10.5)
nRBC: 0 % (ref 0.0–0.2)

## 2021-01-13 LAB — CEA (IN HOUSE-CHCC): CEA (CHCC-In House): 1.96 ng/mL (ref 0.00–5.00)

## 2021-01-13 MED ORDER — HEPARIN SOD (PORK) LOCK FLUSH 100 UNIT/ML IV SOLN
500.0000 [IU] | Freq: Once | INTRAVENOUS | Status: AC
  Administered 2021-01-13: 500 [IU] via INTRAVENOUS
  Filled 2021-01-13: qty 5

## 2021-01-13 MED ORDER — SODIUM CHLORIDE 0.9% FLUSH
10.0000 mL | INTRAVENOUS | Status: DC | PRN
  Administered 2021-01-13: 10 mL via INTRAVENOUS
  Filled 2021-01-13: qty 10

## 2021-01-13 NOTE — Telephone Encounter (Signed)
Scheduled appt per 4/11 los - gave patient AVS and calender per los

## 2021-01-13 NOTE — Progress Notes (Signed)
[facility] OFFICE PROGRESS NOTE   Diagnosis: Rectal cancer  INTERVAL HISTORY:   [name] returns as scheduled.  She saw Dr. Laurence Ferrari on 12/18/2020.  No liver lesions were visible by ultrasound.  She is scheduled for a repeat ultrasound in 3 months.  She has developed alopecia.  She continues to have neuropathy symptoms in the extremities.    Objective:  Vital signs in last 24 hours:  There were no vitals taken for this visit.     Resp: Lungs clear bilaterally Cardio: Regular rate and rhythm GI: No hepatosplenomegaly, no mass, nontender Vascular: No leg edema    Portacath/PICC-without erythema  Lab Results:  Lab Results  Component Value Date   WBC 6.0 01/13/2021   HGB 12.5 01/13/2021   HCT 38.4 01/13/2021   MCV 101.3 (H) 01/13/2021   PLT 182 01/13/2021   NEUTROABS 4.0 01/13/2021    CMP  Lab Results  Component Value Date   NA 142 11/28/2020   K 3.7 11/28/2020   CL 105 11/28/2020   CO2 24 11/28/2020   GLUCOSE 154 (H) 11/28/2020   BUN 17 11/28/2020   CREATININE 1.15 (H) 11/28/2020   CALCIUM 9.1 11/28/2020   PROT 7.0 11/28/2020   ALBUMIN 3.6 11/28/2020   AST 39 11/28/2020   ALT 57 (H) 11/28/2020   ALKPHOS 133 (H) 11/28/2020   BILITOT 0.5 11/28/2020   GFRNONAA 56 (L) 11/28/2020   GFRAA >60 06/17/2020    Lab Results  Component Value Date   CEA1 3.57 10/08/2020     Medications: I have reviewed the patient's current medications.   Assessment/Plan: 1. Rectal cancer ? 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 CTs 05/24/2018-mass beginning at proximal he 7.6 cm from the anal verge, "20 "perirectal lymph nodes, no evidence of metastatic disease ? Pelvic MRI 06/01/2018-tumor measured at 10.8 cm from the anal sphincter, T3b, N1-2 left perirectal lymph nodes, each measuring 5 mm ? Radiation and Xeloda initiated  06/13/2018, completed 07/20/2018 ? Low anterior resection 09/16/2018,ypT3,ypN1c, 1 cm tumor, partial response-score 2, 0/13 lymph nodes positive, 2 tumor deposits, MSI-stable, no loss of mismatch repair protein expression ? Cycle 1 CAPOX 10/17/2018 ? Cycle 2 CAPOX 11/07/2018 (oxaliplatin dose reduced due to neutropenia) ? Cycle 3 CAPOX 11/28/2018 ? Cycle 4 CAPOX 12/19/2018 ? CTs 05/29/2019-no evidence of metastatic disease, severe right hydronephrosis, left thyroid nodule ? CTs 04/11/2020-subtle nodular lesion in the inferior right liver suspicious for a new metastasis, status post right ureteral resection and psoas hitch/flap, resolution of hydronephrosis, sacral insufficiency fractures bilaterally ? MRI liver 05/08/2020-2.1 cm segment 6 lesion suspicious for a metastasis, 4 mm suspicious segment 3 lesion 4 mm segment 4B lesion consistent with a hemangioma, no abdominal lymphadenopathy, cholelithiasis ? Ablation of segment 6 and segment 3 lesions 06/19/2020, pathology from segment 6 biopsy confirmed metastatic adenocarcinoma-microsatellite stable, tumor mutation burden 3, K-ras wild type, NRAS wild-type, [id number] ? MRI abdomen 08/13/2020-ablation defects in segment 6 and segment 3, tiny foci of restricted diffusion with arterial phase enhancement at the junctions of segment 4A and segment 4B, and segment 5 concerning for metastatic disease ? CTs 09/09/2020-stable ablation sites at segment 6 and segment 2. Small focus of hyperenhancement measuring 7 mm in the inferior right liver segment 6 anterior to the ablation site, and in the anterior right liver segment 5/8-unchanged compared to MRI and concerning for metastatic disease, no other evidence of metastatic disease. Scattered irregular  groundglass opacities in the posterior right upper lobe ? Cycle 1 FOLFIRI/Avastin 09/23/2020 ? Treatment held 10/08/2020 due to neutropenia ? Cycle 2 FOLFIRI/Avastin 10/14/2020, Udenyca ? Cycle 3 FOLFIRI/Avastin 10/30/2020,  Udenyca ? Cycle 4 FOLFIRI/Avastin 11/14/2020, Udenyca ? Cycle 5 FOLFIRI/Avastin 11/28/2020, Udenyca ? CTs 12/11/2020-no evidence of metastatic disease in the chest, stable liver ablation sites, stable hypervascular lesion in segment 4A, previously described enhancing lesion in the right hepatic lobe not well appreciated, no new lesions\ ? Real-time ultrasound evaluation by Dr. Laurence Ferrari 12/18/2020-no lesions are visible sonographically, therefore not a candidate for ablation 2. History ofTobacco use 3. 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 Alyson Ingles, urology 4. Migraine headaches 5. Restless legs 6. Arm pain during the oxaliplatin infusion and for 1 week after cycle 1. The oxaliplatin will be further diluted and infusion time extended getting with cycle 2. 7. Neutropenia secondary to chemotherapy. Oxaliplatin dose reduced beginning with cycle 2. 8. 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 Right hydronephrosis on CT 05/29/2019-referred to Dr. Alyson Ingles, surgical reimplantation of the right ureter on 08/03/2019, right ureter stent removed 09/14/2019 11. Thyroid nodules-new versus enlarged left thyroid nodule CT 05/29/2019, thyroid ultrasound 06/20/2019-multiple nodules, 1 right-sided nodule met criteria for ultrasound follow-up  Ultrasound 05/08/2020-multinodular goiter, stable nodules in the isthmus and right thyroid meeting criteria for 1 year follow-up, additional stable nodules do not meet criteria for follow-up   Disposition: [name] appears stable.  She has been maintained off of systemic therapy since late February.  The restaging CTs in March were consistent with a response to therapy.   She wanted to consider ablation of any remaining disease in the liver, but no lesion was visible on a real-time ultrasound.  I discussed treatment options with Ms. Karr including resumption of FOLFIRI/Avastin and continued observation.  She would like to continue observation.  She will be scheduled for a 72-monthinterval ultrasound with Dr. MLaurence Ferrari  She will return for an office visit and Port-A-Cath flush in approximately 6 weeks.  GBetsy Coder MD  01/13/2021  1:26 PM

## 2021-02-12 ENCOUNTER — Encounter: Payer: Self-pay | Admitting: Oncology

## 2021-02-19 ENCOUNTER — Other Ambulatory Visit: Payer: Self-pay | Admitting: Interventional Radiology

## 2021-02-19 DIAGNOSIS — K769 Liver disease, unspecified: Secondary | ICD-10-CM

## 2021-02-25 ENCOUNTER — Encounter: Payer: Self-pay | Admitting: Oncology

## 2021-02-25 ENCOUNTER — Other Ambulatory Visit: Payer: Self-pay

## 2021-02-25 ENCOUNTER — Encounter: Payer: Self-pay | Admitting: Radiology

## 2021-02-25 ENCOUNTER — Ambulatory Visit
Admission: RE | Admit: 2021-02-25 | Discharge: 2021-02-25 | Disposition: A | Discharge status: Home / Self Care | Payer: 59 | Source: Ambulatory Visit | Attending: Interventional Radiology | Admitting: Interventional Radiology

## 2021-02-25 ENCOUNTER — Inpatient Hospital Stay: Payer: 59

## 2021-02-25 ENCOUNTER — Inpatient Hospital Stay: Payer: 59 | Attending: Oncology | Admitting: Oncology

## 2021-02-25 VITALS — BP 136/87 | HR 101 | Temp 98.0°F | Resp 20 | Ht 68.0 in | Wt 214.1 lb

## 2021-02-25 DIAGNOSIS — C787 Secondary malignant neoplasm of liver and intrahepatic bile duct: Secondary | ICD-10-CM

## 2021-02-25 DIAGNOSIS — K5900 Constipation, unspecified: Secondary | ICD-10-CM

## 2021-02-25 DIAGNOSIS — G2581 Restless legs syndrome: Secondary | ICD-10-CM

## 2021-02-25 DIAGNOSIS — K769 Liver disease, unspecified: Secondary | ICD-10-CM

## 2021-02-25 DIAGNOSIS — G43909 Migraine, unspecified, not intractable, without status migrainosus: Secondary | ICD-10-CM | POA: Diagnosis not present

## 2021-02-25 DIAGNOSIS — Z95828 Presence of other vascular implants and grafts: Secondary | ICD-10-CM

## 2021-02-25 DIAGNOSIS — C20 Malignant neoplasm of rectum: Secondary | ICD-10-CM | POA: Diagnosis present

## 2021-02-25 HISTORY — PX: IR RADIOLOGIST EVAL & MGMT: IMG5224

## 2021-02-25 MED ORDER — SODIUM CHLORIDE 0.9% FLUSH
10.0000 mL | Freq: Once | INTRAVENOUS | Status: AC
  Administered 2021-02-25: 10 mL
  Filled 2021-02-25: qty 10

## 2021-02-25 MED ORDER — HEPARIN SOD (PORK) LOCK FLUSH 100 UNIT/ML IV SOLN
500.0000 [IU] | Freq: Once | INTRAVENOUS | Status: AC
  Administered 2021-02-25: 500 [IU] via INTRAVENOUS
  Filled 2021-02-25: qty 5

## 2021-02-25 NOTE — Patient Instructions (Signed)

## 2021-02-25 NOTE — Progress Notes (Signed)
Chief Complaint: Patient was seen in follow-up today for rectal cancer metastatic to the liver at the request of Shirla Hodgkiss K  Referring Physician(s): Dr. Julieanne Manson  History of Present Illness: Norma Colon is a 60 y.o. female who initially presentedat the kind request of Dr. 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 Norma Colon was initially diagnosed with a partially obstructing rectal mass in August 2019. She underwent neoadjuvant combined radiation and chemotherapy (Xeloda) from September through October 2019 followed by a low anterior resection in December 2019.   Subsequently, she underwent 4 cycles of additional adjuvant chemotherapy with CAPOX completed in March 2020. Subsequent surveillance imaging initially detected no evidence of metastatic disease. Unfortunately, CT imaging from July 2021 demonstrated a subtle lesion in hepatic segment 6 concerning for metastatic disease. A follow-up MRI dated 05/08/2020 confirms a 2.1 cm solid lesion in hepatic segment 6 as well as a concerning for millimeter lesion in the posterior aspect of segment 3. Both lesions are concerning for metastatic disease.  She underwent concurrent ultrasound-guided biopsy and microwave ablation of the 2 liver lesions on 06/19/2020.   Follow-up MRI imaging from 08/13/20 showed an excellent treatment effect at the sites of the 2 ablated lesions, but 2 new lesions highly concerning for additional metastatic foci.  Norma Colon therefore went back for additional chemotherapy and has now completed another cycle of FOLFIRI/Avastin on 11/28/2020. She reported increased malaise following this cycle of chemotherapy. She had diarrhea and mild nosebleeding.  Restaging CT scans of the chest, abdomen and pelvis dated 12/12/2020 demonstrates no evidence of new metastatic disease or disease outside the liver.  The previously identified tiny  hypervascular lesions in segment 4A and segment 6 are less conspicuous and perhaps slightly smaller consistent with interval treatment response.    US imaging performed by me on  12/18/20 and again on 02/25/21 showed no visible hepatic lesions.   She is doing well and states she feels "great" now.    Past Medical History:  Diagnosis Date  . Adenocarcinoma of rectum [facility]) oncologist-- dr Benay Spice--  per last note in epic ,  clinical remission   dx 08/ 2019---- chemoradiation concurrent completed 07-20-2018;  s/p  low anterior resection 09-16-2018;   chemo 10-17-2018  ot 12-19-2018  . Anxiety   . Arthritis   . Chemotherapy induced neutropenia ([place])   . Depression   . Headache    migraines   . History of HPV infection   . History of kidney stones   . Hydronephrosis, right   . Restless leg syndrome   . Scoliosis   . Sepsis ([place])    11-2018    Past Surgical History:  Procedure Laterality Date  . COLPOSCOPY  04/04/2018  . CYSTOSCOPY WITH RETROGRADE PYELOGRAM, URETEROSCOPY AND STENT PLACEMENT Right 12/12/2018   Procedure: CYSTOSCOPY WITH RETROGRADE PYELOGRAM, URETEROSCOPY, STONE EXTRACTION AND STENT PLACEMENT;  Surgeon: Cleon Gustin, MD;  Location: [facility];  Service: Urology;  Laterality: Right;  . CYSTOSCOPY WITH RETROGRADE PYELOGRAM, URETEROSCOPY AND STENT PLACEMENT Right 04/17/2019   Procedure: CYSTOSCOPY WITH RETROGRADE PYELOGRAM, BALLOON Joice  URETEROSCOPY AND STENT PLACEMENT;  Surgeon: Cleon Gustin, MD;  Location: [facility];  Service: Urology;  Laterality: Right;  . CYSTOSCOPY WITH STENT PLACEMENT Right 11/21/2018   Procedure: CYSTOSCOPY, RIGHT RETROGRADE PYELOGRAM, WITH RIGHT URETERAL STENT PLACEMENT;  Surgeon: Cleon Gustin, MD;  Location: [facility];  Service: Urology;  Laterality: Right;  . IR  IMAGING GUIDED PORT INSERTION  09/04/2020  . IR NEPHROSTOMY PLACEMENT RIGHT  06/22/2019  . IR PATIENT EVAL TECH 0-60 MINS   06/28/2019  . IR RADIOLOGIST EVAL & MGMT  06/06/2020  . IR RADIOLOGIST EVAL & MGMT  07/17/2020  . IR RADIOLOGIST EVAL & MGMT  08/15/2020  . IR RADIOLOGIST EVAL & MGMT  09/03/2020  . IR RADIOLOGIST EVAL & MGMT  12/18/2020  . IR RADIOLOGIST EVAL & MGMT  02/25/2021  . left forearm surgery due to calcium rock     . RADIOLOGY WITH ANESTHESIA N/A 06/19/2020   Procedure: CT WITH ANESTHESIA MICROWAVE ABLATION;  Surgeon: Jacqulynn Cadet, MD;  Location: [facility];  Service: Anesthesiology;  Laterality: N/A;  . ROBOT ASSISTED PYELOPLASTY Right 08/03/2019   Procedure: XI ROBOTIC ASSISTED PYELOPLASTY WITH STENT PLACEMENT;  Surgeon: Cleon Gustin, MD;  Location: [facility];  Service: Urology;  Laterality: Right;  3 HRS  . SHOULDER ARTHROSCOPY W/ ROTATOR CUFF REPAIR Right   . XI ROBOTIC ASSISTED LOWER ANTERIOR RESECTION N/A 09/16/2018   Procedure: XI ROBOTIC ASSISTED LOWER ANTERIOR RESECTION ERAS PATHWAY;  Surgeon: Leighton Ruff, MD;  Location: [facility];  Service: General;  Laterality: N/A;    Allergies: Prednisone and Codeine  Medications: Prior to Admission medications   Medication Sig Start Date End Date Taking? Authorizing Provider  alprazolam Duanne Moron) 2 MG tablet Take 2 mg by mouth every morning.     [provider]  Ascorbic Acid (VITAMIN C PO) Take 1 tablet by mouth every morning.     [provider]  aspirin EC 81 MG tablet Take 81 mg by mouth every morning. Swallow whole.     [provider]  BIOTIN PO Take 10,000 mcg by mouth every morning.    [provider]  buPROPion (WELLBUTRIN XL) 300 MG 24 hr tablet Take 300 mg by mouth every morning.     [provider]  Cholecalciferol (VITAMIN D-3 PO) Take 5,000 Units by mouth daily.    [provider]  cyclobenzaprine (FLEXERIL) 10 MG tablet Take 10 mg by mouth at bedtime.    [provider]  diphenoxylate-atropine (LOMOTIL) 2.5-0.025 MG tablet Take 1-2 tablets by mouth 4 (four) times daily as  needed for diarrhea or loose stools. 11/14/20   Owens Shark, NP  HYDROcodone-acetaminophen (NORCO) 10-325 MG tablet Take 1-2 tablets by mouth every 6 (six) hours as needed for moderate pain or severe pain (headache). Post-Operatively. Patient taking differently: Take 1-2 tablets by mouth every 4 (four) hours as needed for moderate pain or severe pain (headache). Post-Operatively. 08/05/19   Alexis Frock, MD  lidocaine-prilocaine (EMLA) cream Apply 1 application topically as directed. Apply 1-2 hours prior to IV stick and cover with plastic wrap 11/28/20   Ladell Pier, MD  magic mouthwash SOLN Take 5-10 mLs by mouth 4 (four) times daily as needed for mouth pain. Swish and spit 11/28/20   Ladell Pier, MD  Magnesium 500 MG CAPS Take 500 mg by mouth daily.    [provider]  ondansetron (ZOFRAN) 8 MG tablet Take 1 tablet (8 mg total) by mouth every 8 (eight) hours as needed for nausea or vomiting. Start 72 hours after each IV chemo treatment 09/12/20   Ladell Pier, MD  Probiotic Product (PROBIOTIC PO) Take 1 tablet by mouth every morning. Acidophilus 100 million    [provider]  prochlorperazine (COMPAZINE) 10 MG tablet TAKE 1 TABLET(10 MG) BY MOUTH EVERY 6 HOURS AS NEEDED FOR NAUSEA  10/30/20   Owens Shark, NP  rOPINIRole (REQUIP) 0.25 MG tablet Take 0.25 mg by mouth at bedtime.    [provider]     Family History  Problem Relation Age of Onset  . COPD Father        smoker  . Stomach cancer Father        spread to lungs and bones  . Arthritis Mother   . Heart attack Paternal Grandmother   . Heart attack Paternal Grandfather   . Heart attack Maternal Grandmother   . Stroke Maternal Grandmother   . Colon cancer Neg Hx   . Rectal cancer Neg Hx   . Esophageal cancer Neg Hx     Social History   Socioeconomic History  . Marital status: Legally Separated    Spouse name: Not on file  . Number of children: 1  . Years of education: Not on file   . Highest education level: Not on file  Occupational History  . Occupation: Land  Tobacco Use  . Smoking status: Former Smoker    Packs/day: 1.00    Years: 30.00    Pack years: 30.00    Types: Cigarettes    Quit date: 09/16/2018    Years since quitting: 2.4  . Smokeless tobacco: Never Used  Vaping Use  . Vaping Use: Never used  Substance and Sexual Activity  . Alcohol use: Yes    Comment: social  . Drug use: Never  . Sexual activity: Yes  Other Topics Concern  . Not on file  Social History Narrative  . Not on file   Social Determinants of Health   Financial Resource Strain: Not on file  Food Insecurity: Not on file  Transportation Needs: Not on file  Physical Activity: Not on file  Stress: Not on file  Social Connections: Not on file    ECOG Status: 0 - Asymptomatic  Review of Systems: A 12 point ROS discussed and pertinent positives are indicated in the HPI above.  All other systems are negative.  Review of Systems  Vital Signs: LMP  (LMP Unknown)   Physical Exam Exam conducted with a chaperone present.  Constitutional:      Appearance: Normal appearance.  HENT:     Head: Normocephalic and atraumatic.  Eyes:     General: No scleral icterus. Pulmonary:     Effort: Pulmonary effort is normal.  Abdominal:     General: Abdomen is flat.     Palpations: Abdomen is soft.     Tenderness: There is no abdominal tenderness.  Skin:    General: Skin is warm and dry.  Neurological:     Mental Status: She is alert and oriented to person, place, and time.  Psychiatric:        Mood and Affect: Mood normal.        Behavior: Behavior normal.      Imaging: IR Radiologist Eval & Mgmt  Result Date: 02/25/2021 Please refer to notes tab for details about interventional procedure. (Op Note)  US Abdomen Limited RUQ (LIVER/GB)  Result Date: 02/25/2021 CLINICAL DATA:  60 year old female with a history of rectal cancer metastatic to the liver. She presents for  physician performed ultrasound evaluation to assess visibility of hepatic metastases. EXAM: ULTRASOUND ABDOMEN LIMITED COMPARISON:  Prior abdominal ultrasound 12/18/2020 FINDINGS: The right upper quadrant was interrogated with ultrasound. Previous ablation defects can be seen in the left and inferior right liver. No convincing evidence of new or enlarging hypoechoic lesions to  suggest recurrent or progressive hepatic metastatic disease. The background liver remains heterogeneous and echogenic consistent with hepatic steatosis. No intra or extrahepatic biliary ductal dilatation. IMPRESSION: 1. No evidence of new or progressive hepatic metastatic disease. 2. Prior ablation zones again noted. 3. Background hepatic steatosis. Electronically Signed   By: Jacqulynn Cadet M.D.   On: 02/25/2021 10:16    Labs:  CBC: Recent Labs    10/30/20 1054 11/14/20 0908 11/28/20 0812 01/13/21 0933  WBC 6.7 9.7 8.0 6.0  HGB 12.4 11.0* 11.2* 12.5  HCT 37.6 33.0* 33.7* 38.4  PLT 179 158 164 182    COAGS: Recent Labs    06/19/20 0715 07/11/20 1054  INR 1.1 1.1    BMP: Recent Labs    04/11/20 1102 06/17/20 1513 07/11/20 1052 10/14/20 1314 10/30/20 1054 11/14/20 0908 11/28/20 0812  NA 142 139   < > 141 142 140 142  K 4.1 4.7   < > 4.4 3.9 3.7 3.7  CL 104 103   < > 107 107 107 105  CO2 26 27   < > 24 24 26 24   GLUCOSE 103* 95   < > 109* 140* 107* 154*  BUN 14 22*   < > 16 16 14 17   CALCIUM 9.7 9.5   < > 9.9 9.1 9.1 9.1  CREATININE 1.21* 0.88   < > 1.07* 1.34* 1.08* 1.15*  GFRNONAA 50* >60   < > >60 46* 60* 56*  GFRAA 58* >60  --   --   --   --   --    < > = values in this interval not displayed.    LIVER FUNCTION TESTS: Recent Labs    10/14/20 1314 10/30/20 1054 11/14/20 0908 11/28/20 0812  BILITOT 0.6 0.5 0.4 0.5  AST 54* 38 37 39  ALT 56* 51* 54* 57*  ALKPHOS 119 135* 138* 133*  PROT 7.8 7.4 6.9 7.0  ALBUMIN 3.8 3.7 3.6 3.6    TUMOR MARKERS: No results for input(s): AFPTM,  CEA, CA199, CHROMGRNA in the last 8760 hours.  Assessment and Plan:  60 year old female with 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  She continues to do well 8 months status post percutaneous ablation of hepatic metastatic lesions.  The new small lesions that were identified following ablation and treated by resumption of systemic chemotherapy are not visible by ultrasound again today.  No evidence of new or recurrent disease at this time.  We will continue to follow.  I will ask my office staff to alert me when Norma Colon has a follow-up imaging ordered by Dr. Gearldine Shown office.  If she develops evidence of progressive liver disease in the future, she may be a candidate for additional liver directed therapy and we will see her at that time.    Electronically Signed: Criselda Peaches 02/25/2021, 10:19 AM   I spent a total of  10 Minutes in face to face in clinical consultation, greater than 50% of which was counseling/coordinating care for rectal cancer metastatic to the liver.

## 2021-02-25 NOTE — Progress Notes (Signed)
[facility] OFFICE PROGRESS NOTE   Diagnosis: Rectal cancer  INTERVAL HISTORY:   Norma Colon returns as scheduled.  She feels well.  She continues to have constipation.  She is working.  She developed alopecia following the last cycle of chemotherapy.  She saw Dr. Laurence Ferrari earlier today.  An abdominal ultrasound revealed no evidence of new or progressive hepatic metastatic disease.  Prior ablation defects were noted.  There is hepatic steatosis.  Objective:  Vital signs in last 24 hours:  Blood pressure 136/87, pulse (!) 101, temperature 98 F (36.7 C), temperature source Oral, resp. rate 20, height 5' 8"  (1.727 m), weight 214 lb 1.6 oz (97.1 kg), SpO2 99 %.    Lymphatics: No cervical, supraclavicular, axillary, or inguinal nodes Resp: Lungs clear bilaterally Cardio: Regular rate and rhythm GI: No mass, nontender, no hepatosplenomegaly Vascular: No leg edema    Portacath/PICC-without erythema  Lab Results:  Lab Results  Component Value Date   WBC 6.0 01/13/2021   HGB 12.5 01/13/2021   HCT 38.4 01/13/2021   MCV 101.3 (H) 01/13/2021   PLT 182 01/13/2021   NEUTROABS 4.0 01/13/2021    CMP  Lab Results  Component Value Date   NA 142 11/28/2020   K 3.7 11/28/2020   CL 105 11/28/2020   CO2 24 11/28/2020   GLUCOSE 154 (H) 11/28/2020   BUN 17 11/28/2020   CREATININE 1.15 (H) 11/28/2020   CALCIUM 9.1 11/28/2020   PROT 7.0 11/28/2020   ALBUMIN 3.6 11/28/2020   AST 39 11/28/2020   ALT 57 (H) 11/28/2020   ALKPHOS 133 (H) 11/28/2020   BILITOT 0.5 11/28/2020   GFRNONAA 56 (L) 11/28/2020   GFRAA >60 06/17/2020    Lab Results  Component Value Date   CEA1 1.96 01/13/2021     Imaging:  IR Radiologist Eval & Mgmt  Result Date: 02/25/2021 Please refer to notes tab for details about interventional procedure. (Op Note)  US Abdomen Limited RUQ (LIVER/GB)  Result Date: 02/25/2021 CLINICAL DATA:  60 year old female with a history of rectal cancer  metastatic to the liver. She presents for physician performed ultrasound evaluation to assess visibility of hepatic metastases. EXAM: ULTRASOUND ABDOMEN LIMITED COMPARISON:  Prior abdominal ultrasound 12/18/2020 FINDINGS: The right upper quadrant was interrogated with ultrasound. Previous ablation defects can be seen in the left and inferior right liver. No convincing evidence of new or enlarging hypoechoic lesions to suggest recurrent or progressive hepatic metastatic disease. The background liver remains heterogeneous and echogenic consistent with hepatic steatosis. No intra or extrahepatic biliary ductal dilatation. IMPRESSION: 1. No evidence of new or progressive hepatic metastatic disease. 2. Prior ablation zones again noted. 3. Background hepatic steatosis. Electronically Signed   By: Jacqulynn Cadet M.D.   On: 02/25/2021 10:16    Medications: I have reviewed the patient's current medications.   Assessment/Plan: 1. Rectal cancer ? 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 CTs 05/24/2018-mass beginning at proximal he 7.6 cm from the anal verge, "20 "perirectal lymph nodes, no evidence of metastatic disease ? Pelvic MRI 06/01/2018-tumor measured at 10.8 cm from the anal sphincter, T3b, N1-2 left perirectal lymph nodes, each measuring 5 mm ? Radiation and Xeloda initiated 06/13/2018, completed 07/20/2018 ? Low anterior resection 09/16/2018,ypT3,ypN1c, 1 cm tumor, partial response-score 2, 0/13 lymph nodes positive, 2 tumor deposits, MSI-stable, no loss of mismatch repair protein expression ? Cycle 1 CAPOX 10/17/2018 ? Cycle 2 CAPOX  11/07/2018 (oxaliplatin dose reduced due to neutropenia) ? Cycle 3 CAPOX 11/28/2018 ? Cycle 4 CAPOX 12/19/2018 ? CTs 05/29/2019-no evidence of metastatic disease, severe right hydronephrosis, left thyroid nodule ? CTs 04/11/2020-subtle nodular lesion  in the inferior right liver suspicious for a new metastasis, status post right ureteral resection and psoas hitch/flap, resolution of hydronephrosis, sacral insufficiency fractures bilaterally ? MRI liver 05/08/2020-2.1 cm segment 6 lesion suspicious for a metastasis, 4 mm suspicious segment 3 lesion 4 mm segment 4B lesion consistent with a hemangioma, no abdominal lymphadenopathy, cholelithiasis ? Ablation of segment 6 and segment 3 lesions 06/19/2020, pathology from segment 6 biopsy confirmed metastatic adenocarcinoma-microsatellite stable, tumor mutation burden 3, K-ras wild type, NRAS wild-type, [id number] ? MRI abdomen 08/13/2020-ablation defects in segment 6 and segment 3, tiny foci of restricted diffusion with arterial phase enhancement at the junctions of segment 4A and segment 4B, and segment 5 concerning for metastatic disease ? CTs 09/09/2020-stable ablation sites at segment 6 and segment 2. Small focus of hyperenhancement measuring 7 mm in the inferior right liver segment 6 anterior to the ablation site, and in the anterior right liver segment 5/8-unchanged compared to MRI and concerning for metastatic disease, no other evidence of metastatic disease. Scattered irregular groundglass opacities in the posterior right upper lobe ? Cycle 1 FOLFIRI/Avastin 09/23/2020 ? Treatment held 10/08/2020 due to neutropenia ? Cycle 2 FOLFIRI/Avastin 10/14/2020, Udenyca ? Cycle 3 FOLFIRI/Avastin 10/30/2020, Udenyca ? Cycle 4 FOLFIRI/Avastin 11/14/2020, Udenyca ? Cycle 5 FOLFIRI/Avastin 11/28/2020, Udenyca ? CTs 12/11/2020-no evidence of metastatic disease in the chest, stable liver ablation sites, stable hypervascular lesion in segment 4A, previously described enhancing lesion in the right hepatic lobe not well appreciated, no new lesions\ ? Real-time ultrasound evaluation by Dr. Laurence Ferrari 12/18/2020-no lesions are visible sonographically, therefore not a candidate for ablation ? Upper quadrant ultrasound 02/25/2021-no  new or progressive hepatic metastases, prior ablation defects noted 2. History ofTobacco use 3. 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 Alyson Ingles, urology 4. Migraine headaches 5. Restless legs 6. Arm pain during the oxaliplatin infusion and for 1 week after cycle 1. The oxaliplatin will be further diluted and infusion time extended getting with cycle 2. 7. Neutropenia secondary to chemotherapy. Oxaliplatin dose reduced beginning with cycle 2. 8. 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 Right hydronephrosis on CT 05/29/2019-referred to Dr. Alyson Ingles, surgical reimplantation of the right ureter on 08/03/2019, right ureter stent removed 09/14/2019 11. Thyroid nodules-new versus enlarged left thyroid nodule CT 05/29/2019, thyroid ultrasound 06/20/2019-multiple nodules, 1 right-sided nodule met criteria for ultrasound follow-up  Ultrasound 05/08/2020-multinodular goiter, stable nodules in the isthmus and right thyroid meeting criteria for 1 year follow-up, additional stable nodules do not meet criteria for follow-up   Disposition: Ms. Adriance appears stable.  There is no clinical or radiologic evidence of disease progression.  The plan is to continue observation.  She will return for an office visit and Port-A-Cath flush on 04/15/2021.  We will schedule a restaging CT for July or August.  Betsy Coder, MD  02/25/2021  12:06 PM

## 2021-03-10 ENCOUNTER — Other Ambulatory Visit: Payer: Self-pay

## 2021-03-10 ENCOUNTER — Other Ambulatory Visit: Payer: Self-pay | Admitting: Nurse Practitioner

## 2021-03-10 DIAGNOSIS — C20 Malignant neoplasm of rectum: Secondary | ICD-10-CM

## 2021-03-10 DIAGNOSIS — C787 Secondary malignant neoplasm of liver and intrahepatic bile duct: Secondary | ICD-10-CM

## 2021-03-10 MED ORDER — DIPHENOXYLATE-ATROPINE 2.5-0.025 MG PO TABS: ORAL_TABLET | ORAL | 0 refills | Status: DC

## 2021-03-10 NOTE — Telephone Encounter (Signed)
For your review and approval

## 2021-03-11 ENCOUNTER — Encounter: Payer: Self-pay | Admitting: Oncology

## 2021-03-18 ENCOUNTER — Encounter: Payer: Self-pay | Admitting: Oncology

## 2021-03-31 ENCOUNTER — Encounter: Payer: Self-pay | Admitting: Oncology

## 2021-04-15 ENCOUNTER — Other Ambulatory Visit: Payer: Self-pay

## 2021-04-15 ENCOUNTER — Inpatient Hospital Stay: Payer: 59

## 2021-04-15 ENCOUNTER — Inpatient Hospital Stay: Payer: 59 | Attending: Oncology | Admitting: Oncology

## 2021-04-15 VITALS — BP 129/90 | HR 90 | Temp 97.8°F | Resp 18 | Ht 68.0 in | Wt 212.4 lb

## 2021-04-15 DIAGNOSIS — E041 Nontoxic single thyroid nodule: Secondary | ICD-10-CM | POA: Diagnosis not present

## 2021-04-15 DIAGNOSIS — G43909 Migraine, unspecified, not intractable, without status migrainosus: Secondary | ICD-10-CM | POA: Diagnosis not present

## 2021-04-15 DIAGNOSIS — R200 Anesthesia of skin: Secondary | ICD-10-CM | POA: Diagnosis not present

## 2021-04-15 DIAGNOSIS — Z95828 Presence of other vascular implants and grafts: Secondary | ICD-10-CM

## 2021-04-15 DIAGNOSIS — C20 Malignant neoplasm of rectum: Secondary | ICD-10-CM | POA: Diagnosis present

## 2021-04-15 DIAGNOSIS — G2581 Restless legs syndrome: Secondary | ICD-10-CM | POA: Insufficient documentation

## 2021-04-15 LAB — BASIC METABOLIC PANEL - CANCER CENTER ONLY
Anion gap: 10 (ref 5–15)
BUN: 21 mg/dL — ABNORMAL HIGH (ref 6–20)
CO2: 26 mmol/L (ref 22–32)
Calcium: 9.6 mg/dL (ref 8.9–10.3)
Chloride: 104 mmol/L (ref 98–111)
Creatinine: 1.03 mg/dL — ABNORMAL HIGH (ref 0.44–1.00)
GFR, Estimated: >60 mL/min (ref >60)
Glucose, Bld: 84 mg/dL (ref 70–99)
Potassium: 4 mmol/L (ref 3.5–5.1)
Sodium: 140 mmol/L (ref 135–145)

## 2021-04-15 LAB — CBC WITH DIFFERENTIAL (CANCER CENTER ONLY)
Abs Immature Granulocytes: 0.03 10*3/uL (ref 0.00–0.07)
Basophils Absolute: 0 10*3/uL (ref 0.0–0.1)
Basophils Relative: 0 %
Eosinophils Absolute: 0.1 10*3/uL (ref 0.0–0.5)
Eosinophils Relative: 2 %
HCT: 43.6 % (ref 36.0–46.0)
Hemoglobin: 14 g/dL (ref 12.0–15.0)
Immature Granulocytes: 1 %
Lymphocytes Relative: 23 %
Lymphs Abs: 1.5 10*3/uL (ref 0.7–4.0)
MCH: 29.7 pg (ref 26.0–34.0)
MCHC: 32.1 g/dL (ref 30.0–36.0)
MCV: 92.4 fL (ref 80.0–100.0)
Monocytes Absolute: 0.5 10*3/uL (ref 0.1–1.0)
Monocytes Relative: 8 %
Neutro Abs: 4.3 10*3/uL (ref 1.7–7.7)
Neutrophils Relative %: 66 %
Platelet Count: 234 10*3/uL (ref 150–400)
RBC: 4.72 MIL/uL (ref 3.87–5.11)
RDW: 13.1 % (ref 11.5–15.5)
WBC Count: 6.5 10*3/uL (ref 4.0–10.5)
nRBC: 0 % (ref 0.0–0.2)

## 2021-04-15 LAB — CEA (ACCESS): CEA (CHCC): 2.69 ng/mL (ref 0.00–5.00)

## 2021-04-15 MED ORDER — HEPARIN SOD (PORK) LOCK FLUSH 100 UNIT/ML IV SOLN
500.0000 [IU] | Freq: Once | INTRAVENOUS | Status: AC
  Administered 2021-04-15: 500 [IU] via INTRAVENOUS
  Filled 2021-04-15: qty 5

## 2021-04-15 MED ORDER — SODIUM CHLORIDE 0.9% FLUSH
10.0000 mL | Freq: Once | INTRAVENOUS | Status: AC
  Administered 2021-04-15: 10 mL via INTRAVENOUS
  Filled 2021-04-15: qty 10

## 2021-04-15 NOTE — Patient Instructions (Signed)
Implanted Port Home Guide An implanted port is a device that is placed under the skin. It is usually placed in the chest. The device can be used to give IV medicine, to take blood, or for dialysis. You may have an implanted port if: You need IV medicine that would be irritating to the small veins in your hands or arms. You need IV medicines, such as antibiotics, for a long period of time. You need IV nutrition for a long period of time. You need dialysis. When you have a port, your health care provider can choose to use the port instead of veins in your arms for these procedures. You may have fewer limitations when using a port than you would if you used other types of long-term IVs, and you will likely be able to return to normal activities afteryour incision heals. An implanted port has two main parts: Reservoir. The reservoir is the part where a needle is inserted to give medicines or draw blood. The reservoir is round. After it is placed, it appears as a small, raised area under your skin. Catheter. The catheter is a thin, flexible tube that connects the reservoir to a vein. Medicine that is inserted into the reservoir goes into the catheter and then into the vein. How is my port accessed? To access your port: A numbing cream may be placed on the skin over the port site. Your health care provider will put on a mask and sterile gloves. The skin over your port will be cleaned carefully with a germ-killing soap and allowed to dry. Your health care provider will gently pinch the port and insert a needle into it. Your health care provider will check for a blood return to make sure the port is in the vein and is not clogged. If your port needs to remain accessed to get medicine continuously (constant infusion), your health care provider will place a clear bandage (dressing) over the needle site. The dressing and needle will need to be changed every week, or as told by your health care provider. What  is flushing? Flushing helps keep the port from getting clogged. Follow instructions from your health care provider about how and when to flush the port. Ports are usually flushed with saline solution or a medicine called heparin. The need for flushing will depend on how the port is used: If the port is only used from time to time to give medicines or draw blood, the port may need to be flushed: Before and after medicines have been given. Before and after blood has been drawn. As part of routine maintenance. Flushing may be recommended every 4-6 weeks. If a constant infusion is running, the port may not need to be flushed. Throw away any syringes in a disposal container that is meant for sharp items (sharps container). You can buy a sharps container from a pharmacy, or you can make one by using an empty hard plastic bottle with a cover. How long will my port stay implanted? The port can stay in for as long as your health care provider thinks it is needed. When it is time for the port to come out, a surgery will be done to remove it. The surgery will be similar to the procedure that was done to putthe port in. Follow these instructions at home:  Flush your port as told by your health care provider. If you need an infusion over several days, follow instructions from your health care provider about how to take   care of your port site. Make sure you: Wash your hands with soap and water before you change your dressing. If soap and water are not available, use alcohol-based hand sanitizer. Change your dressing as told by your health care provider. Place any used dressings or infusion bags into a plastic bag. Throw that bag in the trash. Keep the dressing that covers the needle clean and dry. Do not get it wet. Do not use scissors or sharp objects near the tube. Keep the tube clamped, unless it is being used. Check your port site every day for signs of infection. Check for: Redness, swelling, or  pain. Fluid or blood. Pus or a bad smell. Protect the skin around the port site. Avoid wearing bra straps that rub or irritate the site. Protect the skin around your port from seat belts. Place a soft pad over your chest if needed. Bathe or shower as told by your health care provider. The site may get wet as long as you are not actively receiving an infusion. Return to your normal activities as told by your health care provider. Ask your health care provider what activities are safe for you. Carry a medical alert card or wear a medical alert bracelet at all times. This will let health care providers know that you have an implanted port in case of an emergency. Get help right away if: You have redness, swelling, or pain at the port site. You have fluid or blood coming from your port site. You have pus or a bad smell coming from the port site. You have a fever. Summary Implanted ports are usually placed in the chest for long-term IV access. Follow instructions from your health care provider about flushing the port and changing bandages (dressings). Take care of the area around your port by avoiding clothing that puts pressure on the area, and by watching for signs of infection. Protect the skin around your port from seat belts. Place a soft pad over your chest if needed. Get help right away if you have a fever or you have redness, swelling, pain, drainage, or a bad smell at the port site. This information is not intended to replace advice given to you by your health care provider. Make sure you discuss any questions you have with your healthcare provider. Document Revised: 02/05/2020 Document Reviewed: 02/05/2020 Elsevier Patient Education  2022 [employer]  

## 2021-04-15 NOTE — Progress Notes (Signed)
[place] OFFICE PROGRESS NOTE   Diagnosis: Rectal cancer  INTERVAL HISTORY:   Norma Colon returns as scheduled.  She feels well.  She has developed a rash at the wrist bilaterally.  She is not aware of a specific exposure.  She continues to have constipation.  She has a poor appetite, but she is drinking several nutrition supplements per day.  Objective:  Vital signs in last 24 hours:  Blood pressure (!) 149/93, pulse 90, temperature 97.8 F (36.6 C), temperature source Oral, resp. rate 18, height 5' 8"  (1.727 m), weight 212 lb 6.4 oz (96.3 kg), SpO2 99 %.   Lymphatics: No cervical, supraclavicular, axillary, or inguinal nodes Resp: Lungs clear bilaterally Cardio: Regular rate and rhythm GI: No mass, nontender, no hepatosplenomegaly Vascular: No leg edema  Skin: Erythematous slightly raised rash at the wrist bilaterally  Portacath/PICC-without erythema  Lab Results:  Lab Results  Component Value Date   WBC 6.0 01/13/2021   HGB 12.5 01/13/2021   HCT 38.4 01/13/2021   MCV 101.3 (H) 01/13/2021   PLT 182 01/13/2021   NEUTROABS 4.0 01/13/2021    CMP  Lab Results  Component Value Date   NA 140 04/15/2021   K 4.0 04/15/2021   CL 104 04/15/2021   CO2 26 04/15/2021   GLUCOSE 84 04/15/2021   BUN 21 (H) 04/15/2021   CREATININE 1.03 (H) 04/15/2021   CALCIUM 9.6 04/15/2021   PROT 7.0 11/28/2020   ALBUMIN 3.6 11/28/2020   AST 39 11/28/2020   ALT 57 (H) 11/28/2020   ALKPHOS 133 (H) 11/28/2020   BILITOT 0.5 11/28/2020   GFRNONAA >60 04/15/2021   GFRAA >60 06/17/2020    Lab Results  Component Value Date   CEA1 1.96 01/13/2021   CEA 2.69 04/15/2021     Medications: I have reviewed the patient's current medications.   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 CTs 05/24/2018-mass beginning at  proximal he 7.6 cm from the anal verge, "20 "perirectal lymph nodes, no evidence of metastatic disease Pelvic MRI 06/01/2018- tumor measured at 10.8 cm from the anal sphincter, T3b, N1-2 left perirectal lymph nodes, each measuring 5 mm Radiation and Xeloda initiated 06/13/2018, completed 07/20/2018 Low anterior resection 09/16/2018,ypT3,ypN1c, 1 cm tumor, partial response-score 2, 0/13 lymph nodes positive, 2 tumor deposits, MSI-stable, no loss of mismatch repair protein expression Cycle 1 CAPOX 10/17/2018 Cycle 2 CAPOX 11/07/2018 (oxaliplatin dose reduced due to neutropenia) Cycle 3 CAPOX 11/28/2018 Cycle 4 CAPOX 12/19/2018 CTs 05/29/2019-no evidence of metastatic disease, severe right hydronephrosis, left thyroid nodule CTs 04/11/2020-subtle nodular lesion in the inferior right liver suspicious for a new metastasis, status post right ureteral resection and psoas hitch/flap, resolution of hydronephrosis, sacral insufficiency fractures bilaterally MRI liver 05/08/2020-2.1 cm segment 6 lesion suspicious for a metastasis, 4 mm suspicious segment 3 lesion 4 mm segment 4B lesion consistent with a hemangioma, no abdominal lymphadenopathy, cholelithiasis Ablation of segment 6 and segment 3 lesions 06/19/2020, pathology from segment 6 biopsy confirmed metastatic adenocarcinoma-microsatellite stable, tumor mutation burden 3, K-ras wild type, NRAS wild-type, BRAF [id number] MRI abdomen 08/13/2020-ablation defects in segment 6 and segment 3, tiny foci of restricted diffusion with arterial phase enhancement at the junctions of segment 4A and segment 4B, and segment 5 concerning for metastatic disease CTs 09/09/2020-stable ablation sites at segment 6 and segment 2. Small focus of hyperenhancement measuring 7 mm in the inferior right liver  segment 6 anterior to the ablation site, and in the anterior right liver segment 5/8-unchanged compared to MRI and concerning for metastatic disease, no other evidence of metastatic disease. Scattered  irregular groundglass opacities in the posterior right upper lobe Cycle 1 FOLFIRI/Avastin 09/23/2020 Treatment held 10/08/2020 due to neutropenia Cycle 2 FOLFIRI/Avastin 10/14/2020, Udenyca Cycle 3 FOLFIRI/Avastin 10/30/2020, Udenyca Cycle 4 FOLFIRI/Avastin 11/14/2020, Udenyca Cycle 5 FOLFIRI/Avastin 11/28/2020, Udenyca CTs 12/11/2020-no evidence of metastatic disease in the chest, stable liver ablation sites, stable hypervascular lesion in segment 4A, previously described enhancing lesion in the right hepatic lobe not well appreciated, no new lesions\ Real-time ultrasound evaluation by Dr. Laurence Ferrari 12/18/2020-no lesions are visible sonographically, therefore not a candidate for ablation Upper quadrant ultrasound 02/25/2021-no new or progressive hepatic metastases, prior ablation defects noted History of Tobacco use Right UPJ stone on CT 05/24/2018 CT 11/21/2018-migration of previously noted right UPJ stone, new marked right hydroureteronephrosis Placement of a right JJ stent 11/21/2018 Laser lithotripsy and replacement of right JJ stent 12/12/2018 Followed by Dr. Alyson Ingles, urology Migraine headaches Restless legs Arm pain during the oxaliplatin infusion and for 1 week after cycle 1.  The oxaliplatin will be further diluted and infusion time extended getting with cycle 2. Neutropenia secondary to chemotherapy.  Oxaliplatin dose reduced beginning with cycle 2. 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 Alyson Ingles, surgical reimplantation of the right ureter on 08/03/2019, right ureter stent removed 09/14/2019 Thyroid nodules-new versus enlarged left thyroid nodule CT 05/29/2019, thyroid ultrasound 06/20/2019-multiple nodules, 1 right-sided nodule met criteria for ultrasound follow-up Ultrasound 05/08/2020-multinodular goiter, stable nodules in the isthmus and right thyroid meeting  criteria for 1 year follow-up, additional stable nodules do not meet criteria for follow-up     Disposition: Norma Colon appears stable.  There is no clinical evidence of disease progression.  She will undergo restaging CTs prior to an office visit in 2 months.  She will follow-up with Dr. Laurence Ferrari after the CTs.  Betsy Coder, MD  04/15/2021  3:41 PM

## 2021-04-16 LAB — CEA (IN HOUSE-CHCC): CEA (CHCC-In House): 3.15 ng/mL (ref 0.00–5.00)

## 2021-04-17 ENCOUNTER — Encounter: Payer: Self-pay | Admitting: *Deleted

## 2021-04-17 ENCOUNTER — Telehealth: Payer: Self-pay

## 2021-04-17 NOTE — Telephone Encounter (Signed)
Left message CEA is normal, follow-up as scheduled

## 2021-04-29 ENCOUNTER — Telehealth: Payer: Self-pay | Admitting: Oncology

## 2021-06-17 ENCOUNTER — Inpatient Hospital Stay: Payer: 59

## 2021-06-17 ENCOUNTER — Inpatient Hospital Stay: Payer: 59 | Attending: Oncology

## 2021-06-17 ENCOUNTER — Other Ambulatory Visit: Payer: Self-pay

## 2021-06-17 ENCOUNTER — Ambulatory Visit (HOSPITAL_BASED_OUTPATIENT_CLINIC_OR_DEPARTMENT_OTHER)
Admission: RE | Admit: 2021-06-17 | Discharge: 2021-06-17 | Disposition: A | Discharge status: Home / Self Care | Payer: 59 | Source: Ambulatory Visit | Attending: Oncology | Admitting: Oncology

## 2021-06-17 ENCOUNTER — Encounter (HOSPITAL_BASED_OUTPATIENT_CLINIC_OR_DEPARTMENT_OTHER): Payer: Self-pay

## 2021-06-17 ENCOUNTER — Other Ambulatory Visit: Payer: 59

## 2021-06-17 DIAGNOSIS — K5900 Constipation, unspecified: Secondary | ICD-10-CM | POA: Insufficient documentation

## 2021-06-17 DIAGNOSIS — C20 Malignant neoplasm of rectum: Secondary | ICD-10-CM | POA: Diagnosis present

## 2021-06-17 DIAGNOSIS — R970 Elevated carcinoembryonic antigen [CEA]: Secondary | ICD-10-CM | POA: Insufficient documentation

## 2021-06-17 DIAGNOSIS — G2581 Restless legs syndrome: Secondary | ICD-10-CM | POA: Insufficient documentation

## 2021-06-17 DIAGNOSIS — G43909 Migraine, unspecified, not intractable, without status migrainosus: Secondary | ICD-10-CM | POA: Insufficient documentation

## 2021-06-17 DIAGNOSIS — Z87891 Personal history of nicotine dependence: Secondary | ICD-10-CM | POA: Insufficient documentation

## 2021-06-17 DIAGNOSIS — E041 Nontoxic single thyroid nodule: Secondary | ICD-10-CM | POA: Insufficient documentation

## 2021-06-17 LAB — BASIC METABOLIC PANEL - CANCER CENTER ONLY
Anion gap: 8 (ref 5–15)
BUN: 17 mg/dL (ref 6–20)
CO2: 29 mmol/L (ref 22–32)
Calcium: 9.9 mg/dL (ref 8.9–10.3)
Chloride: 102 mmol/L (ref 98–111)
Creatinine: 0.9 mg/dL (ref 0.44–1.00)
GFR, Estimated: >60 mL/min (ref >60)
Glucose, Bld: 98 mg/dL (ref 70–99)
Potassium: 4.1 mmol/L (ref 3.5–5.1)
Sodium: 139 mmol/L (ref 135–145)

## 2021-06-17 LAB — CEA (ACCESS): CEA (CHCC): 7.03 ng/mL — ABNORMAL HIGH (ref 0.00–5.00)

## 2021-06-17 MED ORDER — HEPARIN SOD (PORK) LOCK FLUSH 100 UNIT/ML IV SOLN
500.0000 [IU] | Freq: Once | INTRAVENOUS | Status: AC
  Administered 2021-06-17: 500 [IU] via INTRAVENOUS

## 2021-06-17 MED ORDER — IOHEXOL 350 MG/ML SOLN
80.0000 mL | Freq: Once | INTRAVENOUS | Status: AC | PRN
  Administered 2021-06-17: 80 mL via INTRAVENOUS

## 2021-06-18 LAB — CEA (IN HOUSE-CHCC): CEA (CHCC-In House): 6.85 ng/mL — ABNORMAL HIGH (ref 0.00–5.00)

## 2021-06-19 ENCOUNTER — Inpatient Hospital Stay (HOSPITAL_BASED_OUTPATIENT_CLINIC_OR_DEPARTMENT_OTHER): Payer: 59 | Admitting: Oncology

## 2021-06-19 ENCOUNTER — Other Ambulatory Visit: Payer: Self-pay

## 2021-06-19 VITALS — BP 146/91 | HR 86 | Temp 97.8°F | Resp 18 | Ht 68.0 in | Wt 214.4 lb

## 2021-06-19 DIAGNOSIS — C20 Malignant neoplasm of rectum: Secondary | ICD-10-CM

## 2021-06-19 DIAGNOSIS — K5900 Constipation, unspecified: Secondary | ICD-10-CM | POA: Diagnosis not present

## 2021-06-19 DIAGNOSIS — G43909 Migraine, unspecified, not intractable, without status migrainosus: Secondary | ICD-10-CM | POA: Diagnosis not present

## 2021-06-19 DIAGNOSIS — Z87891 Personal history of nicotine dependence: Secondary | ICD-10-CM | POA: Diagnosis not present

## 2021-06-19 DIAGNOSIS — E041 Nontoxic single thyroid nodule: Secondary | ICD-10-CM | POA: Diagnosis not present

## 2021-06-19 DIAGNOSIS — G2581 Restless legs syndrome: Secondary | ICD-10-CM | POA: Diagnosis not present

## 2021-06-19 DIAGNOSIS — R970 Elevated carcinoembryonic antigen [CEA]: Secondary | ICD-10-CM | POA: Diagnosis not present

## 2021-06-19 NOTE — Progress Notes (Signed)
[place] OFFICE PROGRESS NOTE   Diagnosis: Rectal cancer   INTERVAL HISTORY:   Norma Colon returns as scheduled.  She generally feels well.  She has developed pain at the left hip and groin for the past several weeks with movement.  The pain is not present with weightbearing.  She has persistent pain in the right flank region following the kidney surgery.  Good appetite.  She continues to have constipation.  Objective:  Vital signs in last 24 hours:  Blood pressure (!) 146/91, pulse 86, temperature 97.8 F (36.6 C), temperature source Oral, resp. rate 18, height 5' 8"  (1.727 m), weight 214 lb 6.4 oz (97.3 kg), SpO2 100 %.   Lymphatics: No cervical, supraclavicular, axillary, or inguinal nodes Resp: Distant breath sounds, no respiratory distress Cardio: Regular rate and rhythm GI: No mass, no hepatosplenomegaly, nontender, examination of the right groin is unremarkable Musculoskeletal: Pain with rotation of the right hip Vascular: No leg edema   Portacath/PICC-without erythema  Lab Results:  Lab Results  Component Value Date   WBC 6.5 04/15/2021   HGB 14.0 04/15/2021   HCT 43.6 04/15/2021   MCV 92.4 04/15/2021   PLT 234 04/15/2021   NEUTROABS 4.3 04/15/2021    CMP  Lab Results  Component Value Date   NA 139 06/17/2021   K 4.1 06/17/2021   CL 102 06/17/2021   CO2 29 06/17/2021   GLUCOSE 98 06/17/2021   BUN 17 06/17/2021   CREATININE 0.90 06/17/2021   CALCIUM 9.9 06/17/2021   PROT 7.0 11/28/2020   ALBUMIN 3.6 11/28/2020   AST 39 11/28/2020   ALT 57 (H) 11/28/2020   ALKPHOS 133 (H) 11/28/2020   BILITOT 0.5 11/28/2020   GFRNONAA >60 06/17/2021   GFRAA >60 06/17/2020    Lab Results  Component Value Date   CEA1 6.85 (H) 06/17/2021   CEA 7.03 (H) 06/17/2021    Lab Results  Component Value Date   INR 1.1 07/11/2020   LABPROT 14.1 07/11/2020    Imaging:  CT CHEST ABDOMEN PELVIS W CONTRAST  Result Date: 06/18/2021 CLINICAL DATA:   Colorectal cancer surveillance. EXAM: CT CHEST, ABDOMEN, AND PELVIS WITH CONTRAST TECHNIQUE: Multidetector CT imaging of the chest, abdomen and pelvis was performed following the standard protocol during bolus administration of intravenous contrast. CONTRAST:  105m OMNIPAQUE IOHEXOL 350 MG/ML SOLN COMPARISON:  12/11/2020. FINDINGS: CT CHEST FINDINGS Cardiovascular: Atherosclerotic calcification of the aorta. Heart size normal. No pericardial effusion. Mediastinum/Nodes: Small low-attenuation lesions in the thyroid. No follow-up recommended. (Ref: J Am Coll Radiol. 2015 Feb;12(2): 143-50).No pathologically enlarged mediastinal, hilar or axillary lymph nodes. Esophagus is grossly unremarkable. Lungs/Pleura: Paraseptal emphysema. No suspicious pulmonary nodules. No pleural fluid. Airway is unremarkable. Musculoskeletal: Mild degenerative changes in the spine. No worrisome lytic or sclerotic lesions. CT ABDOMEN PELVIS FINDINGS Hepatobiliary: Liver is slightly decreased in attenuation diffusely. 1.5 cm slightly mixed attenuation lesion in the left hepatic lobe is unchanged. Faint blush of hyperattenuation in segment 4 (2/61), unchanged. Heterogeneous mass in the posterior right hepatic lobe measures 2.6 x 3.2 cm, stable. Liver is enlarged, 19.7 cm. Stones in the gallbladder. No biliary ductal dilatation. Pancreas: Negative. Spleen: Negative. Adrenals/Urinary Tract: Adrenal glands and kidneys are unremarkable. Ureters are decompressed. Bladder is grossly unremarkable. Stomach/Bowel: Stomach, small bowel, appendix and colon are unremarkable with the exception of an anastomosis in the rectum. Vascular/Lymphatic: Circumaortic left renal vein. No pathologically enlarged lymph nodes. Reproductive: Uterus is visualized.  No adnexal mass. Other: No free fluid.  Mesenteries  and peritoneum are unremarkable. 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 No worrisome  lytic or sclerotic lesions. IMPRESSION: 1. Ablation defects in the liver are unchanged. No evidence of new metastatic disease. 2. Hepatic steatosis. 3. Cholelithiasis. 4.  Aortic atherosclerosis (ICD10-I70.0). 5.  Emphysema (ICD10-J43.9). Electronically Signed   By: Lorin Picket M.D.   On: 06/18/2021 11:30    Medications: I have reviewed the patient's current medications.   Assessment/Plan: Rectal cancer Colonoscopy 05/17/2018 revealed a partially obstructing mass beginning at 10 cm from the anal verge, biopsy confirmed invasive moderately differentiated adenocarcinoma CTs 05/24/2018-mass beginning at proximal he 7.6 cm from the anal verge, "20 "perirectal lymph nodes, no evidence of metastatic disease Pelvic MRI 06/01/2018- tumor measured at 10.8 cm from the anal sphincter, T3b, N1-2 left perirectal lymph nodes, each measuring 5 mm Radiation and Xeloda initiated 06/13/2018, completed 07/20/2018 Low anterior resection 09/16/2018,ypT3,ypN1c, 1 cm tumor, partial response-score 2, 0/13 lymph nodes positive, 2 tumor deposits, MSI-stable, no loss of mismatch repair protein expression Cycle 1 CAPOX 10/17/2018 Cycle 2 CAPOX 11/07/2018 (oxaliplatin dose reduced due to neutropenia) Cycle 3 CAPOX 11/28/2018 Cycle 4 CAPOX 12/19/2018 CTs 05/29/2019-no evidence of metastatic disease, severe right hydronephrosis, left thyroid nodule CTs 04/11/2020-subtle nodular lesion in the inferior right liver suspicious for a new metastasis, status post right ureteral resection and psoas hitch/flap, resolution of hydronephrosis, sacral insufficiency fractures bilaterally MRI liver 05/08/2020-2.1 cm segment 6 lesion suspicious for a metastasis, 4 mm suspicious segment 3 lesion 4 mm segment 4B lesion consistent with a hemangioma, no abdominal lymphadenopathy, cholelithiasis Ablation of segment 6 and segment 3 lesions 06/19/2020, pathology from segment 6 biopsy confirmed metastatic adenocarcinoma-microsatellite stable, tumor mutation  burden 3, K-ras wild type, NRAS wild-type, BRAF [id number] MRI abdomen 08/13/2020-ablation defects in segment 6 and segment 3, tiny foci of restricted diffusion with arterial phase enhancement at the junctions of segment 4A and segment 4B, and segment 5 concerning for metastatic disease CTs 09/09/2020-stable ablation sites at segment 6 and segment 2. Small focus of hyperenhancement measuring 7 mm in the inferior right liver segment 6 anterior to the ablation site, and in the anterior right liver segment 5/8-unchanged compared to MRI and concerning for metastatic disease, no other evidence of metastatic disease. Scattered irregular groundglass opacities in the posterior right upper lobe Cycle 1 FOLFIRI/Avastin 09/23/2020 Treatment held 10/08/2020 due to neutropenia Cycle 2 FOLFIRI/Avastin 10/14/2020, Udenyca Cycle 3 FOLFIRI/Avastin 10/30/2020, Udenyca Cycle 4 FOLFIRI/Avastin 11/14/2020, Udenyca Cycle 5 FOLFIRI/Avastin 11/28/2020, Udenyca CTs 12/11/2020-no evidence of metastatic disease in the chest, stable liver ablation sites, stable hypervascular lesion in segment 4A, previously described enhancing lesion in the right hepatic lobe not well appreciated, no new lesions\ Real-time ultrasound evaluation by Dr. Laurence Ferrari 12/18/2020-no lesions are visible sonographically, therefore not a candidate for ablation Upper quadrant ultrasound 02/25/2021-no new or progressive hepatic metastases, prior ablation defects noted CTs 06/17/2021-unchanged 1.5 cm mixed attenuation left hepatic lesion, stable heterogenous lesion in the posterior right liver, no evidence of new metastatic disease CEA mildly elevated 06/17/2021 History of Tobacco use Right UPJ stone on CT 05/24/2018 CT 11/21/2018-migration of previously noted right UPJ stone, new marked right hydroureteronephrosis Placement of a right JJ stent 11/21/2018 Laser lithotripsy and replacement of right JJ stent 12/12/2018 Followed by Dr. Alyson Ingles, urology Migraine  headaches Restless legs Arm pain during the oxaliplatin infusion and for 1 week after cycle 1.  The oxaliplatin will be further diluted and infusion time extended getting with cycle 2. Neutropenia secondary to chemotherapy.  Oxaliplatin dose reduced beginning with cycle 2. 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 Alyson Ingles, surgical reimplantation of the right ureter on 08/03/2019, right ureter stent removed 09/14/2019 Thyroid nodules-new versus enlarged left thyroid nodule CT 05/29/2019, thyroid ultrasound 06/20/2019-multiple nodules, 1 right-sided nodule met criteria for ultrasound follow-up Ultrasound 05/08/2020-multinodular goiter, stable nodules in the isthmus and right thyroid meeting criteria for 1 year follow-up, additional stable nodules do not meet criteria for follow-up     Disposition: Norma Colon has a history of metastatic rectal cancer.  There is no clinical or radiologic evidence of disease progression, but the CEA is mildly elevated.  I suspect the right groin discomfort is related to a benign musculoskeletal condition.  She will return for an office visit, Port-A-Cath flush, and CEA in 6 weeks.  If the CEA is more elevated we will submit peripheral blood testing for circulating tumor DNA.  She plans to schedule a surveillance colonoscopy with Dr. Bryan Lemma.  I will asked Dr. Laurence Ferrari to review the CT images.  Betsy Coder, MD  06/19/2021  3:27 PM

## 2021-06-23 ENCOUNTER — Other Ambulatory Visit: Payer: Self-pay

## 2021-06-23 ENCOUNTER — Ambulatory Visit
Admission: RE | Admit: 2021-06-23 | Discharge: 2021-06-23 | Disposition: A | Discharge status: Home / Self Care | Payer: 59 | Source: Ambulatory Visit | Attending: Oncology | Admitting: Oncology

## 2021-06-23 ENCOUNTER — Encounter: Payer: Self-pay | Admitting: *Deleted

## 2021-06-23 DIAGNOSIS — C20 Malignant neoplasm of rectum: Secondary | ICD-10-CM

## 2021-06-23 HISTORY — PX: IR RADIOLOGIST EVAL & MGMT: IMG5224

## 2021-06-23 NOTE — Progress Notes (Signed)
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    Referring Physician(s): Sherrill,Gary B  History of Present Illness: Norma Colon is a 60 y.o. female who initially presented at the kind request of Dr. 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  Norma Colon was initially diagnosed with a partially obstructing rectal mass in August 2019.  She underwent neoadjuvant combined radiation and chemotherapy (Xeloda) from September through October 2019 followed by a low anterior resection in December 2019.     Subsequently, she underwent 4 cycles of additional adjuvant chemotherapy with CAPOX completed in March 2020.  Subsequent surveillance imaging initially detected no evidence of metastatic disease.  Unfortunately, CT imaging from July 2021 demonstrated a subtle lesion in hepatic segment 6 concerning for metastatic disease.  A follow-up MRI dated 05/08/2020 confirms a 2.1 cm solid lesion in hepatic segment 6 as well as a concerning for millimeter lesion in the posterior aspect of segment 3.  Both lesions are concerning for metastatic disease.   She underwent concurrent ultrasound-guided biopsy and microwave ablation of the 2 liver lesions on 06/19/2020.     Follow-up MRI imaging from 08/13/20 showed an excellent treatment effect at the sites of the 2 ablated lesions, but 2 new lesions highly concerning for additional metastatic foci.  Norma Colon therefore went back for additional chemotherapy and has now completed another cycle of FOLFIRI/Avastin on 11/28/2020.  She reported increased malaise following this cycle of chemotherapy.  She had diarrhea and mild nosebleeding.   Restaging CT scans of the chest, abdomen and pelvis dated 12/12/2020 demonstrates no evidence of new metastatic disease or disease outside the liver.  The previously  identified tiny hypervascular lesions in segment 4A and segment 6 are less conspicuous and perhaps slightly smaller consistent with interval treatment response.     US imaging performed by me on  12/18/20 and again on 02/25/21 showed no visible hepatic lesions.   Re-staging CT scans 06/17/21 - New 1.7 cm lesion adjacent to the posterosuperior margin of the segment 6 ablation defect concerning for recurrent disease.    I reviewed these findings with Norma Colon via telephone conference, she is at the beach this week. She otherwise feels well.    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[facility]) oncologist-- dr Benay Spice--  per last note in epic ,  clinical remission   dx 08/ 2019---- chemoradiation concurrent completed 07-20-2018;  s/p  low anterior resection 09-16-2018;   chemo 10-17-2018  ot 12-19-2018   Anxiety    Arthritis    Chemotherapy induced neutropenia ([place])    Depression    Headache    migraines    History of HPV infection    History of kidney stones    Hydronephrosis, right    Restless leg syndrome    Scoliosis    Sepsis ([place])    11-2018    Past Surgical History:  Procedure Laterality Date   COLPOSCOPY  04/04/2018   CYSTOSCOPY WITH RETROGRADE PYELOGRAM, URETEROSCOPY AND STENT PLACEMENT Right 12/12/2018   Procedure: CYSTOSCOPY WITH RETROGRADE PYELOGRAM, URETEROSCOPY, STONE EXTRACTION AND STENT PLACEMENT;  Surgeon: Cleon Gustin, MD;  Location: [facility];  Service: Urology;  Laterality: Right;   CYSTOSCOPY WITH RETROGRADE PYELOGRAM, URETEROSCOPY AND STENT PLACEMENT Right 04/17/2019   Procedure: CYSTOSCOPY WITH RETROGRADE PYELOGRAM, BALLOON [place]  URETEROSCOPY AND STENT PLACEMENT;  Surgeon:  McKenzie, Candee Furbish, MD;  Location: [facility];  Service: Urology;  Laterality: Right;   CYSTOSCOPY WITH STENT PLACEMENT Right 11/21/2018   Procedure: CYSTOSCOPY, RIGHT RETROGRADE PYELOGRAM, WITH RIGHT URETERAL STENT  PLACEMENT;  Surgeon: Cleon Gustin, MD;  Location: [facility];  Service: Urology;  Laterality: Right;   IR IMAGING GUIDED PORT INSERTION  09/04/2020   IR NEPHROSTOMY PLACEMENT RIGHT  06/22/2019   IR PATIENT EVAL TECH 0-60 MINS  06/28/2019   IR RADIOLOGIST EVAL & MGMT  06/06/2020   IR RADIOLOGIST EVAL & MGMT  07/17/2020   IR RADIOLOGIST EVAL & MGMT  08/15/2020   IR RADIOLOGIST EVAL & MGMT  09/03/2020   IR RADIOLOGIST EVAL & MGMT  12/18/2020   IR RADIOLOGIST EVAL & MGMT  02/25/2021   left forearm surgery due to calcium rock      RADIOLOGY WITH ANESTHESIA N/A 06/19/2020   Procedure: CT WITH ANESTHESIA MICROWAVE ABLATION;  Surgeon: Jacqulynn Cadet, MD;  Location: [facility];  Service: Anesthesiology;  Laterality: N/A;   ROBOT ASSISTED PYELOPLASTY Right 08/03/2019   Procedure: XI ROBOTIC ASSISTED PYELOPLASTY WITH STENT PLACEMENT;  Surgeon: Cleon Gustin, MD;  Location: [facility];  Service: Urology;  Laterality: Right;  3 HRS   SHOULDER ARTHROSCOPY W/ ROTATOR CUFF REPAIR Right    XI ROBOTIC ASSISTED LOWER ANTERIOR RESECTION N/A 09/16/2018   Procedure: XI ROBOTIC ASSISTED LOWER ANTERIOR RESECTION ERAS PATHWAY;  Surgeon: Leighton Ruff, MD;  Location: [facility];  Service: General;  Laterality: N/A;    Allergies: Prednisone and Codeine  Medications: Prior to Admission medications   Medication Sig Start Date End Date Taking? Authorizing Provider  alprazolam Duanne Moron) 2 MG tablet Take 2 mg by mouth every morning.     [provider]  Ascorbic Acid (VITAMIN C PO) Take 1 tablet by mouth every morning.     [provider]  aspirin EC 81 MG tablet Take 81 mg by mouth every morning. Swallow whole.     [provider]  BIOTIN PO Take 20,000 mcg by mouth every morning.    [provider]  buPROPion (WELLBUTRIN XL) 300 MG 24 hr tablet Take 300 mg by mouth every morning.     [provider]  Cholecalciferol (VITAMIN D-3 PO) Take 5,000 Units by mouth daily.    [provider]  Cyanocobalamin (B-12 PO) Take by mouth.    [provider]  cyclobenzaprine (FLEXERIL) 10 MG tablet Take 10 mg by mouth at bedtime.    [provider]  diphenoxylate-atropine (LOMOTIL) 2.5-0.025 MG tablet TAKE 1 TO 2 TABLETS BY MOUTH FOUR TIMES DAILY AS NEEDED FOR DIARRHEA OR LOOSE STOOLS Patient not taking: Reported on 06/19/2021 03/10/21   Owens Shark, NP  HYDROcodone-acetaminophen (NORCO) 10-325 MG tablet Take 1-2 tablets by mouth every 6 (six) hours as needed for moderate pain or severe pain (headache). Post-Operatively. Patient taking differently: Take 1-2 tablets by mouth every 4 (four) hours as needed for moderate pain or severe pain (headache). Post-Operatively. 08/05/19   Alexis Frock, MD  lidocaine-prilocaine (EMLA) cream Apply 1 application topically as directed. Apply 1-2 hours prior to IV stick and cover with plastic wrap 11/28/20   Ladell Pier, MD  magic mouthwash SOLN Take 5-10 mLs by mouth 4 (four) times daily as needed for mouth pain. Swish and spit 11/28/20   Ladell Pier, MD  Magnesium 500 MG CAPS Take 500 mg by mouth daily. Patient not taking: No sig reported    [provider]  ondansetron (ZOFRAN) 8 MG tablet Take 1 tablet (8 mg total) by mouth every 8 (eight) hours as needed for nausea or vomiting. Start 72 hours after each IV chemo treatment Patient not taking: No sig reported 09/12/20   Ladell Pier, MD  Probiotic Product (PROBIOTIC PO) Take 1 tablet by mouth every morning. Acidophilus 100 million    [provider]  prochlorperazine (COMPAZINE) 10 MG tablet TAKE 1 TABLET(10 MG) BY MOUTH EVERY 6 HOURS AS NEEDED FOR NAUSEA Patient not taking: No sig reported 10/30/20   Owens Shark, NP  rOPINIRole (REQUIP) 0.25 MG tablet Take 0.25 mg by mouth at bedtime.    [provider]     Family History  Problem Relation Age of Onset   COPD Father        smoker   Stomach cancer Father        spread to lungs  and bones   Arthritis Mother    Heart attack Paternal Grandmother    Heart attack Paternal Grandfather    Heart attack Maternal Grandmother    Stroke Maternal Grandmother    Colon cancer Neg Hx    Rectal cancer Neg Hx    Esophageal cancer Neg Hx     Social History   Socioeconomic History   Marital status: Legally Separated    Spouse name: Not on file   Number of children: 1   Years of education: Not on file   Highest education level: Not on file  Occupational History   Occupation: Land  Tobacco Use   Smoking status: Former    Packs/day: 1.00    Years: 30.00    Pack years: 30.00    Types: Cigarettes    Quit date: 09/16/2018    Years since quitting: 2.7   Smokeless tobacco: Never  Vaping Use   Vaping Use: Never used  Substance and Sexual Activity   Alcohol use: Yes    Comment: social   Drug use: Never   Sexual activity: Yes  Other Topics Concern   Not on file  Social History Narrative   Not on file   Social Determinants of Health   Financial Resource Strain: Not on file  Food Insecurity: Not on file  Transportation Needs: Not on file  Physical Activity: Not on file  Stress: Not on file  Social Connections: Not on file    ECOG Status: 0 - Asymptomatic  Review of Systems  Review of Systems: A 12 point ROS discussed and pertinent positives are indicated in the HPI above.  All other systems are negative.  Physical Exam No direct physical exam was performed (except for noted visual exam findings with Video Visits).   Vital Signs: LMP  (LMP Unknown)   Imaging: CT CHEST ABDOMEN PELVIS W CONTRAST  Addendum Date: 06/19/2021   ADDENDUM REPORT: 06/19/2021 17:47 ADDENDUM: Upon further review, along the superior margin of the ablation site in segment 6, there is a 1.7 cm nodular lesion, worrisome for disease recurrence (2/68 and coronal image 44). This was discussed with Dr. Jacqulynn Cadet. Electronically Signed   By: Lorin Picket M.D.   On: 06/19/2021  17:47   Result Date: 06/19/2021 CLINICAL DATA:  Colorectal cancer surveillance. EXAM: CT CHEST, ABDOMEN, AND PELVIS WITH CONTRAST TECHNIQUE: Multidetector CT imaging of the chest, abdomen and pelvis was performed following the standard protocol during bolus administration of intravenous contrast. CONTRAST:  54m OMNIPAQUE IOHEXOL 350 MG/ML SOLN COMPARISON:  12/11/2020. FINDINGS: CT CHEST FINDINGS Cardiovascular:  Atherosclerotic calcification of the aorta. Heart size normal. No pericardial effusion. Mediastinum/Nodes: Small low-attenuation lesions in the thyroid. No follow-up recommended. (Ref: J Am Coll Radiol. 2015 Feb;12(2): 143-50).No pathologically enlarged mediastinal, hilar or axillary lymph nodes. Esophagus is grossly unremarkable. Lungs/Pleura: Paraseptal emphysema. No suspicious pulmonary nodules. No pleural fluid. Airway is unremarkable. Musculoskeletal: Mild degenerative changes in the spine. No worrisome lytic or sclerotic lesions. CT ABDOMEN PELVIS FINDINGS Hepatobiliary: Liver is slightly decreased in attenuation diffusely. 1.5 cm slightly mixed attenuation lesion in the left hepatic lobe is unchanged. Faint blush of hyperattenuation in segment 4 (2/61), unchanged. Heterogeneous mass in the posterior right hepatic lobe measures 2.6 x 3.2 cm, stable. Liver is enlarged, 19.7 cm. Stones in the gallbladder. No biliary ductal dilatation. Pancreas: Negative. Spleen: Negative. Adrenals/Urinary Tract: Adrenal glands and kidneys are unremarkable. Ureters are decompressed. Bladder is grossly unremarkable. Stomach/Bowel: Stomach, small bowel, appendix and colon are unremarkable with the exception of an anastomosis in the rectum. Vascular/Lymphatic: Circumaortic left renal vein. No pathologically enlarged lymph nodes. Reproductive: Uterus is visualized.  No adnexal mass. Other: No free fluid.  Mesenteries and peritoneum are unremarkable. 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 No worrisome lytic or sclerotic lesions. IMPRESSION: 1. Ablation defects in the liver are unchanged. No evidence of new metastatic disease. 2. Hepatic steatosis. 3. Cholelithiasis. 4.  Aortic atherosclerosis (ICD10-I70.0). 5.  Emphysema (ICD10-J43.9). Electronically Signed: By: Lorin Picket M.D. On: 06/18/2021 11:30    Labs:  CBC: Recent Labs    11/14/20 0908 11/28/20 0812 01/13/21 0933 04/15/21 1531  WBC 9.7 8.0 6.0 6.5  HGB 11.0* 11.2* 12.5 14.0  HCT 33.0* 33.7* 38.4 43.6  PLT 158 164 182 234    COAGS: Recent Labs    07/11/20 1054  INR 1.1    BMP: Recent Labs    11/14/20 0908 11/28/20 0812 04/15/21 1405 06/17/21 1238  NA 140 142 140 139  K 3.7 3.7 4.0 4.1  CL 107 105 104 102  CO2 '26 24 26 29  '$ GLUCOSE 107* 154* 84 98  BUN 14 17 21* 17  CALCIUM 9.1 9.1 9.6 9.9  CREATININE 1.08* 1.15* 1.03* 0.90  GFRNONAA 60* 56* >60 >60    LIVER FUNCTION TESTS: Recent Labs    10/14/20 1314 10/30/20 1054 11/14/20 0908 11/28/20 0812  BILITOT 0.6 0.5 0.4 0.5  AST 54* 38 37 39  ALT 56* 51* 54* 57*  ALKPHOS 119 135* 138* 133*  PROT 7.8 7.4 6.9 7.0  ALBUMIN 3.8 3.7 3.6 3.6    TUMOR MARKERS: Recent Labs    04/15/21 1417 06/17/21 1238  CEA 2.69 7.03*    Assessment and Plan:  60 year-old female with a history of 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  Her most recent staging CTs are concerning for a small recurrence at the segment 6 ablation site.  Her CEA is also trending upward further supporting and confirming oligometastatic disease.  This location is amenable to percutaneous thermal ablation.    I discussed these findings with Norma Colon and she voiced her understanding.  She agrees to proceed with repeat MWA of this marginal recurrence.   1.) Schedule for MWA of segment 6 marginal recurrent rectal cancer metastatic to the liver.    Electronically Signed: Criselda Peaches 06/23/2021, 1:41 PM   I spent a total  of  15 Minutes in remote  clinical consultation, greater than 50% of which was counseling/coordinating care for colon cancer metastatic to the liver.  Visit type: Audio only (telephone). Audio (no video) only due to patient preference. Alternative for in-person consultation at [facility], [address] [place], [place], [place]. This visit type was conducted due to national recommendations for restrictions regarding the COVID-19 Pandemic (e.g. social distancing).  This format is felt to be most appropriate for this patient at this time.  All issues noted in this document were discussed and addressed.

## 2021-06-24 ENCOUNTER — Other Ambulatory Visit (HOSPITAL_COMMUNITY): Payer: Self-pay | Admitting: Interventional Radiology

## 2021-06-24 DIAGNOSIS — C20 Malignant neoplasm of rectum: Secondary | ICD-10-CM

## 2021-07-01 ENCOUNTER — Encounter: Payer: Self-pay | Admitting: Oncology

## 2021-07-10 NOTE — Progress Notes (Signed)
Left voicemail on P.A. line to request pre op orders.

## 2021-07-14 ENCOUNTER — Other Ambulatory Visit: Payer: Self-pay | Admitting: Radiology

## 2021-07-14 NOTE — Patient Instructions (Signed)
DUE TO COVID-19 ONLY ONE VISITOR IS ALLOWED TO COME WITH YOU AND STAY IN THE WAITING ROOM ONLY DURING PRE OP AND PROCEDURE.   **NO VISITORS ARE ALLOWED IN THE SHORT STAY AREA OR RECOVERY ROOM!!**  IF YOU WILL BE ADMITTED INTO THE HOSPITAL YOU ARE ALLOWED ONLY TWO SUPPORT PEOPLE DURING VISITATION HOURS ONLY (10AM -8PM)   The support person(s) may change daily. The support person(s) must pass our screening, gel in and out, and wear a mask at all times, including in the patient's room. Patients must also wear a mask when staff or their support person are in the room.  No visitors under the age of 37. Any visitor under the age of 22 must be accompanied by an adult.    COVID SWAB TESTING MUST BE COMPLETED ON:  07/28/21                      8 AM - 3 PM **MUST PRESENT COMPLETED FORM AT TESTING SITE**    [address] (backside of the building) You are not required to quarantine, however you are required to wear a well-fitted mask when you are out and around people not in your household.  Hand Hygiene often Do NOT share personal items Notify your provider if you are in close contact with someone who has COVID or you develop fever 100.4 or greater, new onset of sneezing, cough, sore throat, shortness of breath or body aches.  [address], [address], must go inside of the hospital, NOT A DRIVE THRU!  (Must self quarantine after testing. Follow instructions on handout.)       Your procedure is scheduled on: 07/30/21   Report to [facility] Main Entrance    Report to admitting at: 7:00 AM   Call this number if you have problems the morning of surgery [phone]   Do not eat food :After Midnight.   May have liquids until : 5:30 AM   day of surgery  CLEAR LIQUID DIET  Foods Allowed                                                                     Foods Excluded  Water, Black Coffee and tea,  regular and decaf                             liquids that you cannot  Plain Jell-O in any flavor  (No red)                                           see through such as: Fruit ices (not with fruit pulp)                                     milk, soups, orange juice              Iced Popsicles (No red)  All solid food                                   Apple juices Sports drinks like Gatorade (No red) Lightly seasoned clear broth or consume(fat free) Sugar,   Sample Menu Breakfast                                Lunch                                     Supper Cranberry juice                    Beef broth                            Chicken broth Jell-O                                     Grape juice                           Apple juice Coffee or tea                        Jell-O                                      Popsicle                                                Coffee or tea                        Coffee or tea    Oral Hygiene is also important to reduce your risk of infection.                                    Remember - BRUSH YOUR TEETH THE MORNING OF SURGERY WITH YOUR REGULAR TOOTHPASTE   Do NOT smoke after Midnight   Take these medicines the morning of surgery with A SIP OF WATER: xanax,bupropion.  DO NOT TAKE ANY ORAL DIABETIC MEDICATIONS DAY OF YOUR SURGERY                              You may not have any metal on your body including hair pins, jewelry, and body piercing             Do not wear make-up, lotions, powders, perfumes/cologne, or deodorant  Do not wear nail polish including gel and S&S, artificial/acrylic nails, or any other type of covering on natural nails including finger and toenails. If you have artificial nails, gel coating, etc. that needs to be removed by a nail salon please have this removed prior to surgery or surgery  may need to be canceled/ delayed if the surgeon/ anesthesia feels like they are unable to be safely  monitored.   Do not shave  48 hours prior to surgery.    Do not bring valuables to the hospital. [place].   Contacts, dentures or bridgework may not be worn into surgery.   Bring small overnight bag day of surgery.    Patients discharged on the day of surgery will not be allowed to drive home.   Special Instructions: Bring a copy of your healthcare power of attorney and living will documents         the day of surgery if you haven't scanned them before.              Please read over the following fact sheets you were given: IF YOU HAVE QUESTIONS ABOUT YOUR PRE-OP INSTRUCTIONS PLEASE CALL [phone]   [facility] - Preparing for Surgery Before surgery, you can play an important role.  Because skin is not sterile, your skin needs to be as free of germs as possible.  You can reduce the number of germs on your skin by washing with CHG (chlorahexidine gluconate) soap before surgery.  CHG is an antiseptic cleaner which kills germs and bonds with the skin to continue killing germs even after washing. Please DO NOT use if you have an allergy to CHG or antibacterial soaps.  If your skin becomes reddened/irritated stop using the CHG and inform your nurse when you arrive at Short Stay. Do not shave (including legs and underarms) for at least 48 hours prior to the first CHG shower.  You may shave your face/neck. Please follow these instructions carefully:  1.  Shower with CHG Soap the night before surgery and the  morning of Surgery.  2.  If you choose to wash your hair, wash your hair first as usual with your  normal  shampoo.  3.  After you shampoo, rinse your hair and body thoroughly to remove the  shampoo.                           4.  Use CHG as you would any other liquid soap.  You can apply chg directly  to the skin and wash                       Gently with a scrungie or clean washcloth.  5.  Apply the CHG Soap to your body ONLY FROM THE NECK  DOWN.   Do not use on face/ open                           Wound or open sores. Avoid contact with eyes, ears mouth and genitals (private parts).                       Wash face,  Genitals (private parts) with your normal soap.             6.  Wash thoroughly, paying special attention to the area where your surgery  will be performed.  7.  Thoroughly rinse your body with warm water from the neck down.  8.  DO NOT shower/wash with your normal soap after using and rinsing off  the CHG Soap.  9.  Pat yourself dry with a clean towel.            10.  Wear clean pajamas.            11.  Place clean sheets on your bed the night of your first shower and do not  sleep with pets. Day of Surgery : Do not apply any lotions/deodorants the morning of surgery.  Please wear clean clothes to the hospital/surgery center.  FAILURE TO FOLLOW THESE INSTRUCTIONS MAY RESULT IN THE CANCELLATION OF YOUR SURGERY PATIENT SIGNATURE_________________________________  NURSE SIGNATURE__________________________________  ________________________________________________________________________

## 2021-07-15 ENCOUNTER — Telehealth: Payer: Self-pay | Admitting: Gastroenterology

## 2021-07-15 ENCOUNTER — Encounter (HOSPITAL_COMMUNITY)
Admission: RE | Admit: 2021-07-15 | Discharge: 2021-07-15 | Disposition: A | Discharge status: Home / Self Care | Payer: 59 | Source: Ambulatory Visit | Attending: Interventional Radiology | Admitting: Interventional Radiology

## 2021-07-15 ENCOUNTER — Encounter (HOSPITAL_COMMUNITY): Payer: Self-pay

## 2021-07-15 ENCOUNTER — Other Ambulatory Visit: Payer: Self-pay

## 2021-07-15 DIAGNOSIS — Z01818 Encounter for other preprocedural examination: Secondary | ICD-10-CM | POA: Diagnosis not present

## 2021-07-15 LAB — CBC WITH DIFFERENTIAL/PLATELET
Abs Immature Granulocytes: 0.05 10*3/uL (ref 0.00–0.07)
Basophils Absolute: 0 10*3/uL (ref 0.0–0.1)
Basophils Relative: 1 %
Eosinophils Absolute: 0.2 10*3/uL (ref 0.0–0.5)
Eosinophils Relative: 3 %
HCT: 42.1 % (ref 36.0–46.0)
Hemoglobin: 13.6 g/dL (ref 12.0–15.0)
Immature Granulocytes: 1 %
Lymphocytes Relative: 26 %
Lymphs Abs: 1.5 10*3/uL (ref 0.7–4.0)
MCH: 30.8 pg (ref 26.0–34.0)
MCHC: 32.3 g/dL (ref 30.0–36.0)
MCV: 95.5 fL (ref 80.0–100.0)
Monocytes Absolute: 0.5 10*3/uL (ref 0.1–1.0)
Monocytes Relative: 10 %
Neutro Abs: 3.3 10*3/uL (ref 1.7–7.7)
Neutrophils Relative %: 59 %
Platelets: 257 10*3/uL (ref 150–400)
RBC: 4.41 MIL/uL (ref 3.87–5.11)
RDW: 13.2 % (ref 11.5–15.5)
WBC: 5.6 10*3/uL (ref 4.0–10.5)
nRBC: 0 % (ref 0.0–0.2)

## 2021-07-15 LAB — COMPREHENSIVE METABOLIC PANEL
ALT: 39 U/L (ref 0–44)
AST: 35 U/L (ref 15–41)
Albumin: 3.9 g/dL (ref 3.5–5.0)
Alkaline Phosphatase: 97 U/L (ref 38–126)
Anion gap: 6 (ref 5–15)
BUN: 19 mg/dL (ref 6–20)
CO2: 30 mmol/L (ref 22–32)
Calcium: 9.7 mg/dL (ref 8.9–10.3)
Chloride: 103 mmol/L (ref 98–111)
Creatinine, Ser: 0.84 mg/dL (ref 0.44–1.00)
GFR, Estimated: >60 mL/min (ref >60)
Glucose, Bld: 80 mg/dL (ref 70–99)
Potassium: 4.1 mmol/L (ref 3.5–5.1)
Sodium: 139 mmol/L (ref 135–145)
Total Bilirubin: 0.6 mg/dL (ref 0.3–1.2)
Total Protein: 7.7 g/dL (ref 6.5–8.1)

## 2021-07-15 LAB — PROTIME-INR
INR: 1.1 (ref 0.8–1.2)
Prothrombin Time: 14.5 seconds (ref 11.4–15.2)

## 2021-07-15 NOTE — Telephone Encounter (Signed)
I reviewed the most recent CT scan from 06/2021, along with recent labs and notes from Dr. Benay Spice and Dr. Laurence Ferrari.  Plan for ablation of hepatic lesion with Dr. Laurence Ferrari later this month.  I think based on the radiographic evidence of recurrence, would be pertinent to shorten the interval of her repeat colonoscopy to ensure no new colonic lesions/metachronous cancer.  Plan for the following:  - Colonoscopy to be done in November allowing for completion of ablation and recovery - Extended 2-day bowel prep when scheduling colonoscopy - Linzess was efficacious but cost prohibitive.  Will trial Amitiza to see if that is better covered - Please send in Rx for Amitiza 24 mcg bid.  90-day supply with RF 5 - Continue adequate hydration, MiraLAX, fiber supplement as currently doing

## 2021-07-15 NOTE — Progress Notes (Addendum)
COVID Vaccine Completed: NO Date COVID Vaccine completed: COVID vaccine manufacturer: [employer] Test: 07/28/21 PCP - Dr. Dian Queen Cardiologist -   Chest x-ray -  EKG -  Stress Test -  ECHO -  Cardiac Cath -  Pacemaker/ICD device last checked:  Sleep Study -  CPAP -   Fasting Blood Sugar -  Checks Blood Sugar _____ times a day  Blood Thinner Instructions: Aspirin Instructions: Last Dose:  Anesthesia review:   Patient denies shortness of breath, fever, cough and chest pain at PAT appointment   Patient verbalized understanding of instructions that were given to them at the PAT appointment. Patient was also instructed that they will need to review over the PAT instructions again at home before surgery.

## 2021-07-15 NOTE — Telephone Encounter (Signed)
Inbound call from pt requesting a call back stating that she wanted Dr. Bryan Lemma to look at some xrays that was done. Pt stated that her cancer doctor is saying she is cancer free but her liver dr. is saying she is not. Pt asked if Dr. Bryan Lemma can look to see if there is anything GI related. Please advise. Thank you.

## 2021-07-15 NOTE — Telephone Encounter (Signed)
Spoke with patient, she wants you to review CT scan from 06/2021. Result available in epic.

## 2021-07-16 MED ORDER — LUBIPROSTONE 24 MCG PO CAPS: 24.0000 ug | ORAL_CAPSULE | Freq: Two times a day (BID) | ORAL | 5 refills | Status: DC

## 2021-07-16 NOTE — Telephone Encounter (Signed)
Patient is returning your call.  

## 2021-07-16 NOTE — Telephone Encounter (Signed)
Spoke with patient in regards to recommendations below. Pt will call pharmacy prior to picking up Amitiza prescription to see how much it costs. Pt has been scheduled for a virtual pre-visit with the nurse on Thursday, 07/31/21 at 10:30 am. Pt is scheduled for a colonoscopy on Tuesday, 08/19/21 at 11:30 am. Pt is aware that she will need to arrive at 10:30 am with a care partner. Pt verbalized understanding of all information and had no concerns at the end of the call.

## 2021-07-16 NOTE — Telephone Encounter (Signed)
Lm on vm for patient to return call 

## 2021-07-22 ENCOUNTER — Encounter: Payer: Self-pay | Admitting: Oncology

## 2021-07-22 IMAGING — CT CT CHEST W/ CM
3 of 5 series · 14 of 36 positions shown, 16 images · IV contrast (APPLIED)
Comparison: CT 09/09/2020, MRI 08/13/2020

CLINICAL DATA: Colorectal carcinoma. Assess treatment response.
Rectal staging.

EXAM:
CT CHEST, ABDOMEN, AND PELVIS WITH CONTRAST
TECHNIQUE: Multidetector CT imaging of the chest, abdomen and pelvis was
performed following the standard protocol during bolus
administration of intravenous contrast.
CONTRAST:  100mL OMNIPAQUE IOHEXOL 300 MG/ML  SOLN

[Series 2: cap with · axial · 0.93mm/px · z∈[+1096,+1611]mm · 8 of 133 slices shown, 10 images]
[im 15/133  mediastinal]
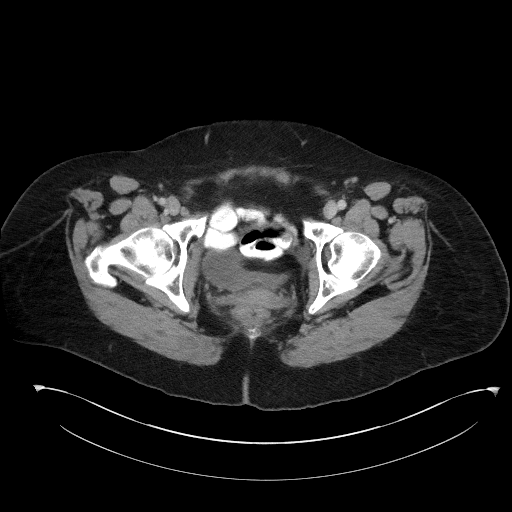
[im 15/133  lung]
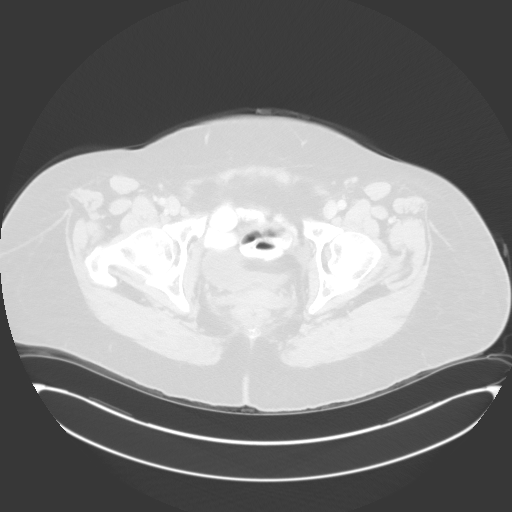
[im 30/133  lung]
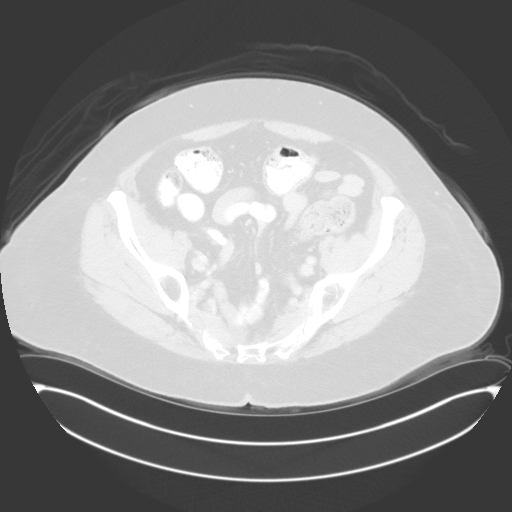
[im 45/133  lung]
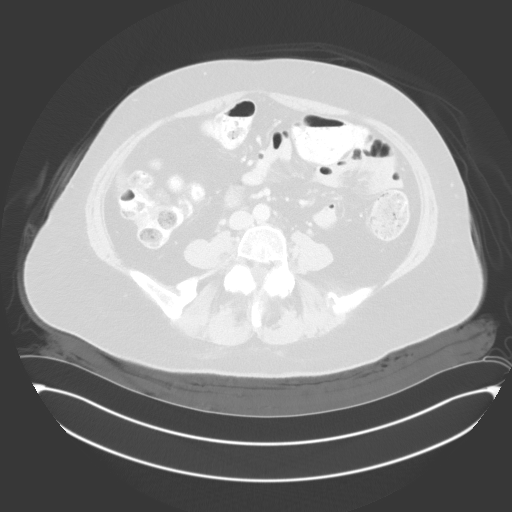
[im 59/133  lung]
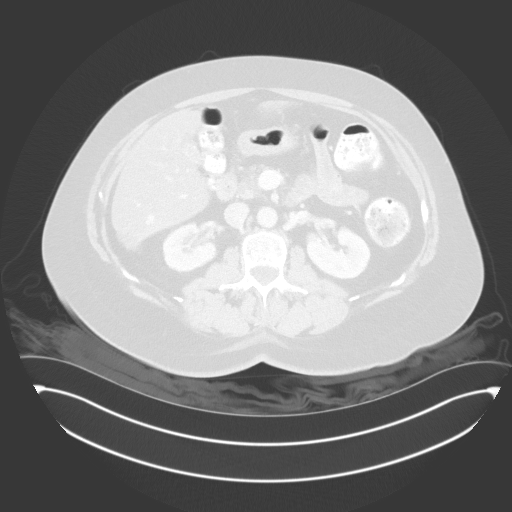
[im 74/133  mediastinal]
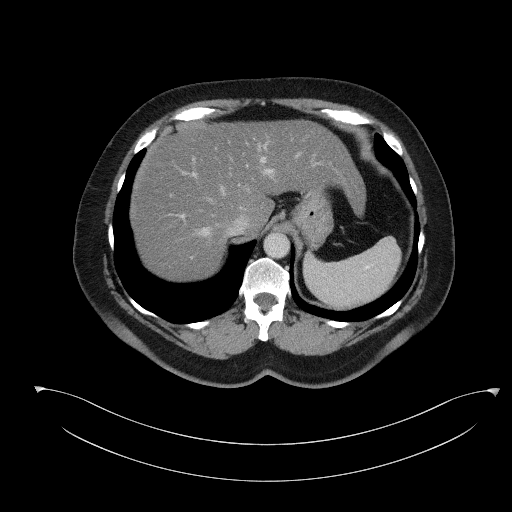
[im 74/133  lung]
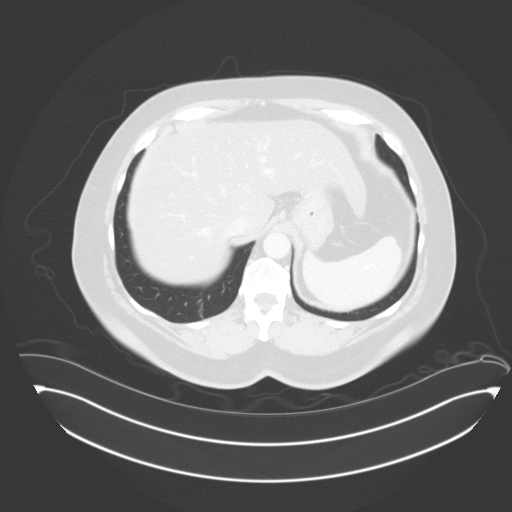
[im 89/133  lung]
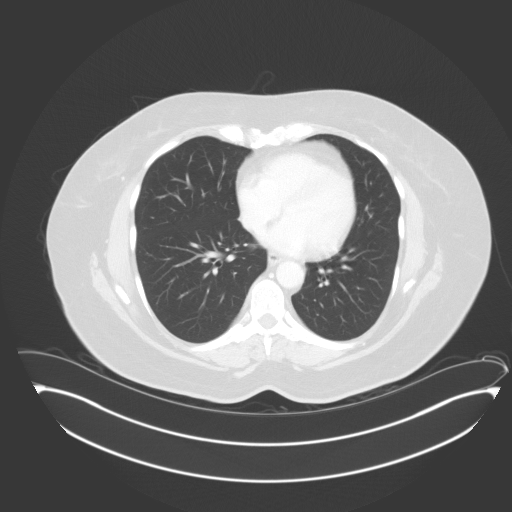
[im 103/133  lung]
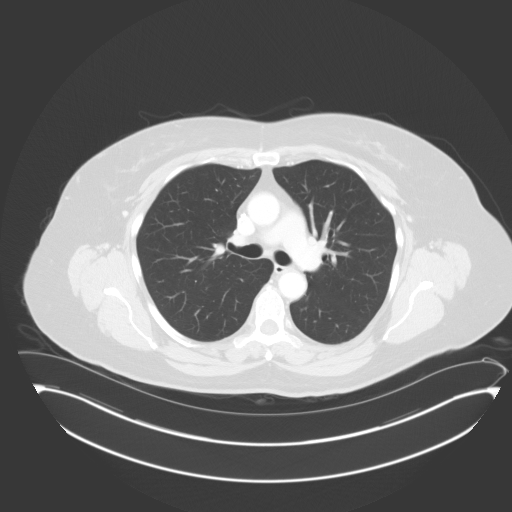
[im 118/133  lung]
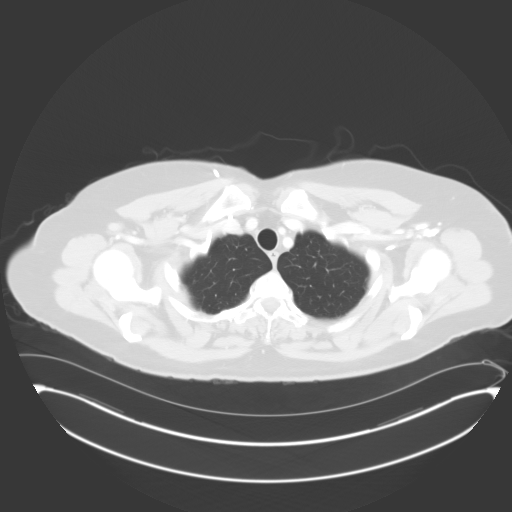

[Series 4: lung · axial · 0.93mm/px · z∈[+1344,+1448]mm · 3 of 185 slices shown]
[im 14/185  lung]
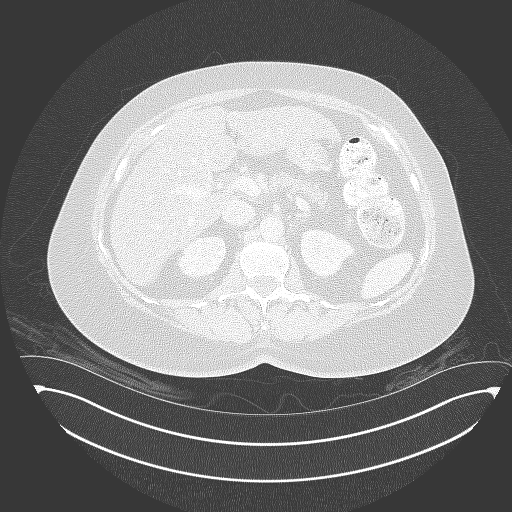
[im 40/185  lung]
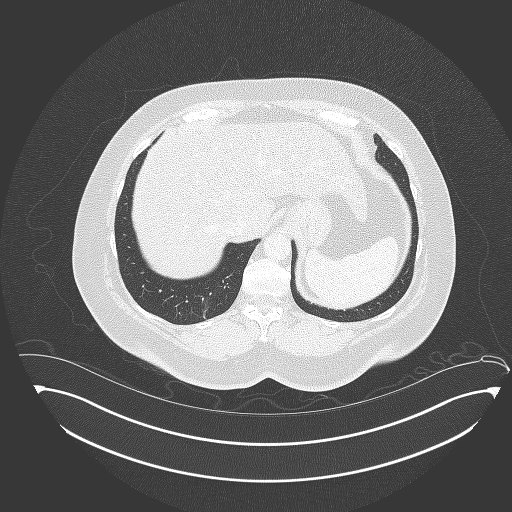
[im 66/185  lung]
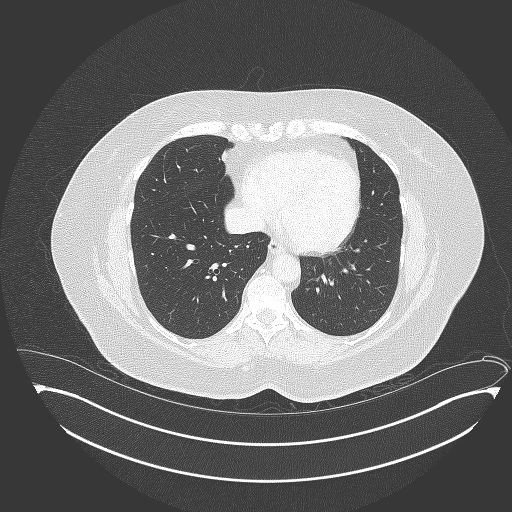

[Series 6: coronals · coronal · 0.77mm/px · 3 of 159 slices shown]
[im 32/159  lung]
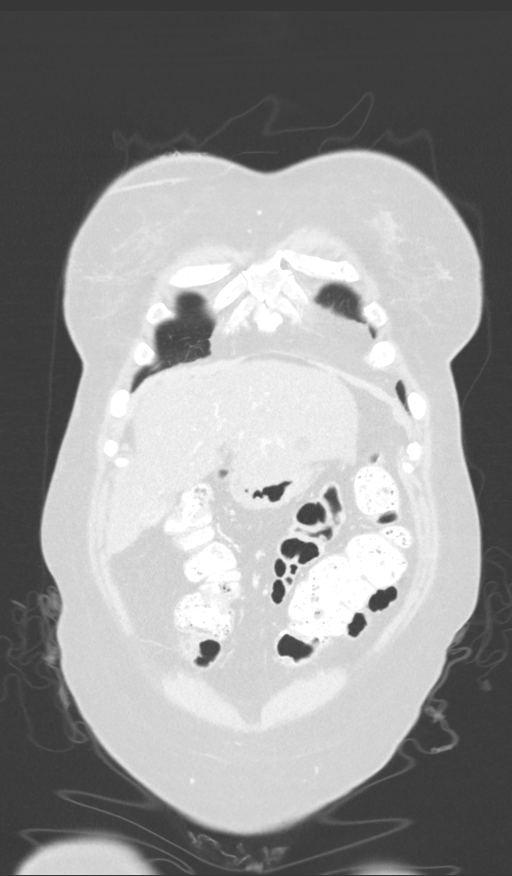
[im 64/159  lung]
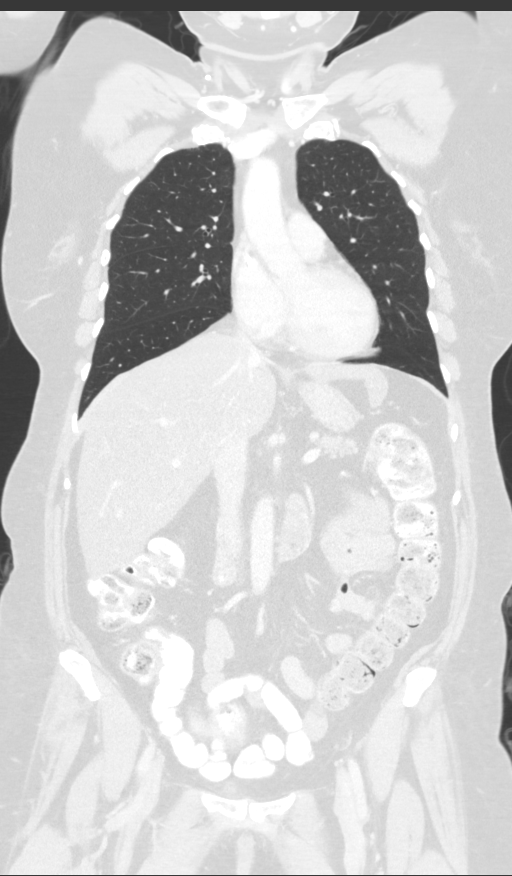
[im 95/159  lung]
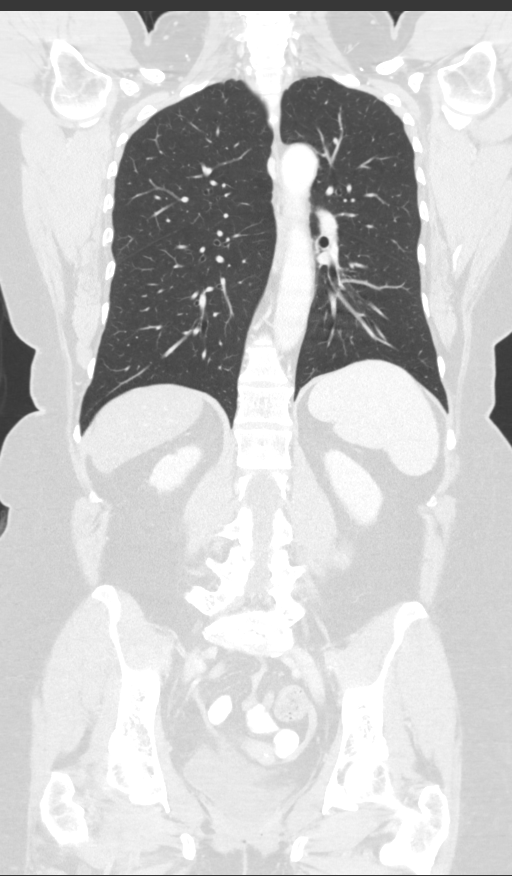

[14 of 36 positions shown; findings below may reference images not displayed]

FINDINGS: CT CHEST FINDINGS

Cardiovascular: Port in the anterior chest wall with tip in distal
SVC.

Mediastinum/Nodes: No axillary or supraclavicular adenopathy. No
mediastinal or hilar adenopathy. No pericardial fluid. Esophagus
normal.

Lungs/Pleura: No suspicious pulmonary nodules. Normal pleural.
Airways normal.

Musculoskeletal: No aggressive osseous lesion.

CT ABDOMEN AND PELVIS FINDINGS

Hepatobiliary: Low-density lesion in the posterior aspect of the
RIGHT hepatic lobe measuring 3.0 cm compared to 3.4 cm. This is in
the ablation site by report. No new nodularity or enhancement
compared to prior.

Second ablation site in the posterior aspect of the lateral LEFT
hepatic lobe measuring 1.3 cm is also unchanged.

Small hypervascular lesion in the central liver (segment 4A)
measuring 7 mm is unchanged from 8 mm (image 65/series 2) no new
enhancing lesions are present. A previous described enhancing lesion
in the RIGHT hepatic lobe is not well appreciated. No new enhancing
lesions.

The gallbladder is contracted around gallstones.

Pancreas: Pancreas is normal. No ductal dilatation. No pancreatic
inflammation.

Spleen: Normal spleen

Adrenals/urinary tract: Adrenal glands and kidneys are normal. The
ureters and bladder normal.

Stomach/Bowel: Stomach, small bowel, appendix, and cecum are normal.

Low rectal anastomosis. No obstruction. Mesenteric lymphadenopathy.

Vascular/Lymphatic: Abdominal aorta is normal caliber. There is no
retroperitoneal or periportal lymphadenopathy. No pelvic
lymphadenopathy.

Reproductive: Uterus and adnexa unremarkable.

Other: No peritoneal metastasis

Musculoskeletal: No aggressive osseous lesion.
IMPRESSION: Chest Impression:

1. No thoracic metastasis.

Abdomen / Pelvis Impression:

1. Stable ablation sites within LEFT and RIGHT hepatic lobe.
2. Stable small enhancing lesion in the central liver, favored
benign vascular phenomena. No new or progressive disease within the
liver.
3. Low rectal anastomosis without evidence local recurrence. No
abdominopelvic lymphadenopathy.

## 2021-07-28 ENCOUNTER — Other Ambulatory Visit: Payer: Self-pay | Admitting: Radiology

## 2021-07-28 ENCOUNTER — Other Ambulatory Visit (HOSPITAL_COMMUNITY): Payer: Self-pay | Admitting: Interventional Radiology

## 2021-07-29 ENCOUNTER — Other Ambulatory Visit: Payer: Self-pay | Admitting: Radiology

## 2021-07-29 LAB — SARS CORONAVIRUS 2 (TAT 6-24 HRS): SARS Coronavirus 2: NEGATIVE

## 2021-07-30 ENCOUNTER — Encounter (HOSPITAL_COMMUNITY): Payer: Self-pay

## 2021-07-30 ENCOUNTER — Other Ambulatory Visit: Payer: Self-pay

## 2021-07-30 ENCOUNTER — Inpatient Hospital Stay (HOSPITAL_COMMUNITY)
Admission: RE | Admit: 2021-07-30 | Discharge: 2021-08-03 | DRG: 660 | Disposition: A | Discharge status: Home / Self Care | Payer: 59 | Source: Ambulatory Visit | Attending: Interventional Radiology | Admitting: Interventional Radiology

## 2021-07-30 ENCOUNTER — Ambulatory Visit (HOSPITAL_COMMUNITY): Payer: 59 | Admitting: Certified Registered Nurse Anesthetist

## 2021-07-30 ENCOUNTER — Ambulatory Visit (HOSPITAL_COMMUNITY)
Admission: RE | Admit: 2021-07-30 | Discharge: 2021-07-30 | Disposition: A | Discharge status: Home / Self Care | Payer: 59 | Source: Ambulatory Visit | Attending: Interventional Radiology | Admitting: Interventional Radiology

## 2021-07-30 ENCOUNTER — Encounter (HOSPITAL_COMMUNITY)
Admission: RE | Disposition: A | Discharge status: Home / Self Care | Payer: Self-pay | Source: Ambulatory Visit | Attending: Interventional Radiology

## 2021-07-30 DIAGNOSIS — M419 Scoliosis, unspecified: Secondary | ICD-10-CM | POA: Diagnosis present

## 2021-07-30 DIAGNOSIS — T8383XA Hemorrhage of genitourinary prosthetic devices, implants and grafts, initial encounter: Principal | ICD-10-CM | POA: Diagnosis present

## 2021-07-30 DIAGNOSIS — R109 Unspecified abdominal pain: Secondary | ICD-10-CM

## 2021-07-30 DIAGNOSIS — Z79899 Other long term (current) drug therapy: Secondary | ICD-10-CM

## 2021-07-30 DIAGNOSIS — C20 Malignant neoplasm of rectum: Secondary | ICD-10-CM

## 2021-07-30 DIAGNOSIS — Z419 Encounter for procedure for purposes other than remedying health state, unspecified: Secondary | ICD-10-CM

## 2021-07-30 DIAGNOSIS — Z888 Allergy status to other drugs, medicaments and biological substances status: Secondary | ICD-10-CM

## 2021-07-30 DIAGNOSIS — G2581 Restless legs syndrome: Secondary | ICD-10-CM | POA: Diagnosis present

## 2021-07-30 DIAGNOSIS — M4317 Spondylolisthesis, lumbosacral region: Secondary | ICD-10-CM | POA: Diagnosis present

## 2021-07-30 DIAGNOSIS — Z885 Allergy status to narcotic agent status: Secondary | ICD-10-CM

## 2021-07-30 DIAGNOSIS — Z9221 Personal history of antineoplastic chemotherapy: Secondary | ICD-10-CM

## 2021-07-30 DIAGNOSIS — Z8261 Family history of arthritis: Secondary | ICD-10-CM

## 2021-07-30 DIAGNOSIS — E669 Obesity, unspecified: Secondary | ICD-10-CM | POA: Diagnosis present

## 2021-07-30 DIAGNOSIS — Z87891 Personal history of nicotine dependence: Secondary | ICD-10-CM

## 2021-07-30 DIAGNOSIS — Q6263 Anomalous implantation of ureter: Secondary | ICD-10-CM

## 2021-07-30 DIAGNOSIS — N179 Acute kidney failure, unspecified: Secondary | ICD-10-CM | POA: Diagnosis present

## 2021-07-30 DIAGNOSIS — N1330 Unspecified hydronephrosis: Secondary | ICD-10-CM

## 2021-07-30 DIAGNOSIS — C787 Secondary malignant neoplasm of liver and intrahepatic bile duct: Secondary | ICD-10-CM | POA: Diagnosis present

## 2021-07-30 DIAGNOSIS — K5900 Constipation, unspecified: Secondary | ICD-10-CM | POA: Diagnosis present

## 2021-07-30 DIAGNOSIS — Z635 Disruption of family by separation and divorce: Secondary | ICD-10-CM

## 2021-07-30 DIAGNOSIS — K8020 Calculus of gallbladder without cholecystitis without obstruction: Secondary | ICD-10-CM | POA: Diagnosis present

## 2021-07-30 DIAGNOSIS — R310 Gross hematuria: Secondary | ICD-10-CM | POA: Diagnosis present

## 2021-07-30 DIAGNOSIS — Z7982 Long term (current) use of aspirin: Secondary | ICD-10-CM

## 2021-07-30 DIAGNOSIS — F419 Anxiety disorder, unspecified: Secondary | ICD-10-CM | POA: Diagnosis present

## 2021-07-30 DIAGNOSIS — R390 Extravasation of urine: Secondary | ICD-10-CM | POA: Diagnosis present

## 2021-07-30 DIAGNOSIS — G43909 Migraine, unspecified, not intractable, without status migrainosus: Secondary | ICD-10-CM | POA: Diagnosis present

## 2021-07-30 DIAGNOSIS — Z20822 Contact with and (suspected) exposure to covid-19: Secondary | ICD-10-CM | POA: Diagnosis present

## 2021-07-30 DIAGNOSIS — Y732 Prosthetic and other implants, materials and accessory gastroenterology and urology devices associated with adverse incidents: Secondary | ICD-10-CM | POA: Diagnosis present

## 2021-07-30 DIAGNOSIS — Z923 Personal history of irradiation: Secondary | ICD-10-CM

## 2021-07-30 DIAGNOSIS — Z800 Family history of malignant neoplasm of digestive organs: Secondary | ICD-10-CM

## 2021-07-30 DIAGNOSIS — E875 Hyperkalemia: Secondary | ICD-10-CM | POA: Diagnosis present

## 2021-07-30 DIAGNOSIS — M1990 Unspecified osteoarthritis, unspecified site: Secondary | ICD-10-CM | POA: Diagnosis present

## 2021-07-30 DIAGNOSIS — Z87442 Personal history of urinary calculi: Secondary | ICD-10-CM

## 2021-07-30 DIAGNOSIS — N9989 Other postprocedural complications and disorders of genitourinary system: Secondary | ICD-10-CM | POA: Diagnosis present

## 2021-07-30 DIAGNOSIS — Z823 Family history of stroke: Secondary | ICD-10-CM

## 2021-07-30 DIAGNOSIS — F32A Depression, unspecified: Secondary | ICD-10-CM | POA: Diagnosis present

## 2021-07-30 DIAGNOSIS — Z8249 Family history of ischemic heart disease and other diseases of the circulatory system: Secondary | ICD-10-CM

## 2021-07-30 DIAGNOSIS — C189 Malignant neoplasm of colon, unspecified: Principal | ICD-10-CM

## 2021-07-30 DIAGNOSIS — Z825 Family history of asthma and other chronic lower respiratory diseases: Secondary | ICD-10-CM

## 2021-07-30 DIAGNOSIS — S3710XA Unspecified injury of ureter, initial encounter: Secondary | ICD-10-CM | POA: Diagnosis present

## 2021-07-30 DIAGNOSIS — Z6832 Body mass index (BMI) 32.0-32.9, adult: Secondary | ICD-10-CM

## 2021-07-30 HISTORY — PX: RADIOLOGY WITH ANESTHESIA: SHX6223

## 2021-07-30 LAB — TYPE AND SCREEN
ABO/RH(D): A POS
Antibody Screen: NEGATIVE

## 2021-07-30 LAB — POCT I-STAT CREATININE: Creatinine, Ser: 3 mg/dL — ABNORMAL HIGH (ref 0.44–1.00)

## 2021-07-30 SURGERY — CT WITH ANESTHESIA
Anesthesia: General

## 2021-07-30 MED ORDER — ASPIRIN EC 81 MG PO TBEC
81.0000 mg | DELAYED_RELEASE_TABLET | Freq: Every morning | ORAL | Status: DC
  Administered 2021-07-30 – 2021-08-03 (×5): 81 mg via ORAL
  Filled 2021-07-30 (×5): qty 1

## 2021-07-30 MED ORDER — LACTATED RINGERS IV SOLN: INTRAVENOUS | Status: DC

## 2021-07-30 MED ORDER — LABETALOL HCL 5 MG/ML IV SOLN
INTRAVENOUS | Status: AC
  Administered 2021-07-30: 5 mg via INTRAVENOUS
  Filled 2021-07-30: qty 4

## 2021-07-30 MED ORDER — LABETALOL HCL 5 MG/ML IV SOLN
5.0000 mg | INTRAVENOUS | Status: DC | PRN
  Administered 2021-07-30 (×2): 5 mg via INTRAVENOUS

## 2021-07-30 MED ORDER — HYDROCODONE-ACETAMINOPHEN 10-325 MG PO TABS
1.0000 | ORAL_TABLET | Freq: Four times a day (QID) | ORAL | Status: DC | PRN
  Administered 2021-07-30: 1 via ORAL
  Administered 2021-07-30: 2 via ORAL
  Administered 2021-08-01 (×2): 1 via ORAL
  Administered 2021-08-02: 2 via ORAL
  Filled 2021-07-30 (×2): qty 2
  Filled 2021-07-30: qty 1
  Filled 2021-07-30 (×2): qty 2
  Filled 2021-07-30: qty 1

## 2021-07-30 MED ORDER — ONDANSETRON HCL 4 MG/2ML IJ SOLN: 4.0000 mg | Freq: Four times a day (QID) | INTRAMUSCULAR | Status: DC | PRN

## 2021-07-30 MED ORDER — BUPROPION HCL ER (XL) 300 MG PO TB24
300.0000 mg | ORAL_TABLET | Freq: Every morning | ORAL | Status: DC
  Administered 2021-07-31 – 2021-08-03 (×4): 300 mg via ORAL
  Filled 2021-07-30 (×4): qty 1

## 2021-07-30 MED ORDER — DOCUSATE SODIUM 100 MG PO CAPS
100.0000 mg | ORAL_CAPSULE | Freq: Two times a day (BID) | ORAL | Status: DC
  Administered 2021-07-30 – 2021-08-03 (×7): 100 mg via ORAL
  Filled 2021-07-30 (×7): qty 1

## 2021-07-30 MED ORDER — PSYLLIUM 95 % PO PACK
1.0000 | PACK | Freq: Every day | ORAL | Status: DC
  Administered 2021-07-31 – 2021-08-03 (×4): 1 via ORAL
  Filled 2021-07-30 (×4): qty 1

## 2021-07-30 MED ORDER — CEFOXITIN SODIUM 2 G IV SOLR
2.0000 g | Freq: Once | INTRAVENOUS | Status: AC
  Administered 2021-07-30: 2 g via INTRAVENOUS
  Filled 2021-07-30: qty 2

## 2021-07-30 MED ORDER — PROMETHAZINE HCL 25 MG/ML IJ SOLN: 6.2500 mg | INTRAMUSCULAR | Status: DC | PRN

## 2021-07-30 MED ORDER — HYDROMORPHONE HCL 1 MG/ML IJ SOLN
1.0000 mg | INTRAMUSCULAR | Status: DC | PRN
Start: 2021-07-30 — End: 2021-08-02
  Administered 2021-07-30 – 2021-08-02 (×8): 1 mg via INTRAVENOUS
  Filled 2021-07-30 (×9): qty 1

## 2021-07-30 MED ORDER — LUBIPROSTONE 24 MCG PO CAPS
24.0000 ug | ORAL_CAPSULE | Freq: Two times a day (BID) | ORAL | Status: DC
  Administered 2021-07-30 – 2021-08-03 (×7): 24 ug via ORAL
  Filled 2021-07-30 (×10): qty 1

## 2021-07-30 MED ORDER — FENTANYL CITRATE PF 50 MCG/ML IJ SOSY
PREFILLED_SYRINGE | INTRAMUSCULAR | Status: AC
  Filled 2021-07-30: qty 1

## 2021-07-30 MED ORDER — KETOROLAC TROMETHAMINE 60 MG/2ML IM SOLN
INTRAMUSCULAR | Status: DC | PRN
  Administered 2021-07-30: 60 mg via INTRAMUSCULAR

## 2021-07-30 MED ORDER — IOHEXOL 350 MG/ML SOLN
150.0000 mL | Freq: Once | INTRAVENOUS | Status: AC | PRN
  Administered 2021-07-30: 150 mL via INTRAVENOUS

## 2021-07-30 MED ORDER — OXYCODONE HCL 5 MG/5ML PO SOLN: 5.0000 mg | Freq: Once | ORAL | Status: AC | PRN

## 2021-07-30 MED ORDER — MIDAZOLAM HCL 2 MG/2ML IJ SOLN: 0.5000 mg | Freq: Once | INTRAMUSCULAR | Status: DC | PRN

## 2021-07-30 MED ORDER — ROCURONIUM BROMIDE 10 MG/ML (PF) SYRINGE
PREFILLED_SYRINGE | INTRAVENOUS | Status: DC | PRN
  Administered 2021-07-30: 20 mg via INTRAVENOUS
  Administered 2021-07-30: 60 mg via INTRAVENOUS

## 2021-07-30 MED ORDER — FENTANYL CITRATE PF 50 MCG/ML IJ SOSY
PREFILLED_SYRINGE | INTRAMUSCULAR | Status: AC
  Filled 2021-07-30: qty 2

## 2021-07-30 MED ORDER — DEXAMETHASONE SODIUM PHOSPHATE 4 MG/ML IJ SOLN
INTRAMUSCULAR | Status: DC | PRN
  Administered 2021-07-30: 5 mg via INTRAVENOUS

## 2021-07-30 MED ORDER — SODIUM CHLORIDE 0.9 % IV SOLN: INTRAVENOUS | Status: DC

## 2021-07-30 MED ORDER — FENTANYL CITRATE (PF) 100 MCG/2ML IJ SOLN
INTRAMUSCULAR | Status: DC | PRN
  Administered 2021-07-30: 50 ug via INTRAVENOUS
  Administered 2021-07-30: 100 ug via INTRAVENOUS
  Administered 2021-07-30 (×2): 50 ug via INTRAVENOUS

## 2021-07-30 MED ORDER — CYCLOBENZAPRINE HCL 10 MG PO TABS
20.0000 mg | ORAL_TABLET | Freq: Every day | ORAL | Status: DC
  Administered 2021-07-30 – 2021-08-02 (×3): 20 mg via ORAL
  Filled 2021-07-30 (×3): qty 2

## 2021-07-30 MED ORDER — DIPHENOXYLATE-ATROPINE 2.5-0.025 MG PO TABS: 1.0000 | ORAL_TABLET | Freq: Four times a day (QID) | ORAL | Status: DC | PRN

## 2021-07-30 MED ORDER — PHENYLEPHRINE 40 MCG/ML (10ML) SYRINGE FOR IV PUSH (FOR BLOOD PRESSURE SUPPORT)
PREFILLED_SYRINGE | INTRAVENOUS | Status: DC | PRN
  Administered 2021-07-30 (×2): 120 ug via INTRAVENOUS

## 2021-07-30 MED ORDER — METHYLCELLULOSE (LAXATIVE) 500 MG PO TABS: ORAL_TABLET | Freq: Every day | ORAL | Status: DC

## 2021-07-30 MED ORDER — SUGAMMADEX SODIUM 200 MG/2ML IV SOLN
INTRAVENOUS | Status: DC | PRN
  Administered 2021-07-30: 200 mg via INTRAVENOUS

## 2021-07-30 MED ORDER — MEPERIDINE HCL 50 MG/ML IJ SOLN: 6.2500 mg | INTRAMUSCULAR | Status: DC | PRN

## 2021-07-30 MED ORDER — CHLORHEXIDINE GLUCONATE 0.12 % MT SOLN
15.0000 mL | Freq: Once | OROMUCOSAL | Status: AC
  Administered 2021-07-30: 15 mL via OROMUCOSAL

## 2021-07-30 MED ORDER — ROPINIROLE HCL 1 MG PO TABS
1.0000 mg | ORAL_TABLET | Freq: Every day | ORAL | Status: DC
  Administered 2021-07-30 – 2021-08-02 (×3): 1 mg via ORAL
  Filled 2021-07-30 (×4): qty 1

## 2021-07-30 MED ORDER — ALPRAZOLAM 1 MG PO TABS
2.0000 mg | ORAL_TABLET | Freq: Every morning | ORAL | Status: DC
  Administered 2021-07-31 – 2021-08-03 (×4): 2 mg via ORAL
  Filled 2021-07-30 (×4): qty 2

## 2021-07-30 MED ORDER — LIDOCAINE 2% (20 MG/ML) 5 ML SYRINGE
INTRAMUSCULAR | Status: DC | PRN
  Administered 2021-07-30: 40 mg via INTRAVENOUS

## 2021-07-30 MED ORDER — PROPOFOL 10 MG/ML IV BOLUS
INTRAVENOUS | Status: DC | PRN
  Administered 2021-07-30 (×2): 40 mg via INTRAVENOUS
  Administered 2021-07-30: 120 mg via INTRAVENOUS

## 2021-07-30 MED ORDER — FENTANYL CITRATE PF 50 MCG/ML IJ SOSY
25.0000 ug | PREFILLED_SYRINGE | INTRAMUSCULAR | Status: DC | PRN
  Administered 2021-07-30 (×3): 50 ug via INTRAVENOUS

## 2021-07-30 MED ORDER — ORAL CARE MOUTH RINSE: 15.0000 mL | Freq: Once | OROMUCOSAL | Status: AC

## 2021-07-30 MED ORDER — FENTANYL CITRATE (PF) 250 MCG/5ML IJ SOLN
INTRAMUSCULAR | Status: AC
  Filled 2021-07-30: qty 5

## 2021-07-30 MED ORDER — OXYCODONE HCL 5 MG PO TABS
ORAL_TABLET | ORAL | Status: AC
  Administered 2021-07-30: 5 mg via ORAL
  Filled 2021-07-30: qty 1

## 2021-07-30 MED ORDER — HYDROMORPHONE HCL 1 MG/ML IJ SOLN
INTRAMUSCULAR | Status: AC
  Filled 2021-07-30: qty 1

## 2021-07-30 MED ORDER — ONDANSETRON HCL 4 MG/2ML IJ SOLN
INTRAMUSCULAR | Status: DC | PRN
  Administered 2021-07-30: 4 mg via INTRAVENOUS

## 2021-07-30 MED ORDER — OXYCODONE HCL 5 MG PO TABS
5.0000 mg | ORAL_TABLET | Freq: Once | ORAL | Status: AC | PRN
Start: 2021-07-30 — End: 2021-07-30

## 2021-07-30 MED ORDER — PHENYLEPHRINE HCL-NACL 20-0.9 MG/250ML-% IV SOLN
INTRAVENOUS | Status: DC | PRN
  Administered 2021-07-30: 30 ug/min via INTRAVENOUS

## 2021-07-30 NOTE — Anesthesia Preprocedure Evaluation (Addendum)
Anesthesia Evaluation  Patient identified by MRN, date of birth, ID band Patient awake    Reviewed: Allergy & Precautions, NPO status , Patient's Chart, lab work & pertinent test results  History of Anesthesia Complications Negative for: history of anesthetic complications  Airway Mallampati: II  TM Distance: >3 FB Neck ROM: Full    Dental  (+) Poor Dentition, Missing, Chipped, Dental Advisory Given   Pulmonary COPD, former smoker,  07/28/2021 SARS coronavirus NEG   breath sounds clear to auscultation       Cardiovascular negative cardio ROS   Rhythm:Regular Rate:Normal     Neuro/Psych  Headaches, Anxiety Depression    GI/Hepatic negative GI ROS, Metastatic disease in liver   Endo/Other  obese  Renal/GU Renal disease (hydronephrosis)   Cervical cancer with mets to liver: surgery, chemo, XRT    Musculoskeletal  (+) Arthritis ,   Abdominal (+) + obese,   Peds  Hematology   Anesthesia Other Findings   Reproductive/Obstetrics                            Anesthesia Physical Anesthesia Plan  ASA: 3  Anesthesia Plan: General   Post-op Pain Management:    Induction: Intravenous  PONV Risk Score and Plan: 3 and Ondansetron and Dexamethasone  Airway Management Planned: Oral ETT  Additional Equipment: None  Intra-op Plan:   Post-operative Plan: Extubation in OR  Informed Consent: I have reviewed the patients History and Physical, chart, labs and discussed the procedure including the risks, benefits and alternatives for the proposed anesthesia with the patient or authorized representative who has indicated his/her understanding and acceptance.     Dental advisory given  Plan Discussed with: CRNA and Surgeon  Anesthesia Plan Comments:        Anesthesia Quick Evaluation

## 2021-07-30 NOTE — Progress Notes (Signed)
Pt sitting upright on stretcher in PACU. Pt groggy but is able to answer questions. She denies pain. Two RUQ sites unremarkable with no bleeding, redness or drainage notes. Dressing C/D/I. Pt has blood-tinged urine in foley catheter. Pt was advised that biopsy of liver lesion was obtained and she should have those results in a few days from the ordering provider.  She is in no distress. VSS. IR will continue to follow.  Electronically Signed: Tyson Alias 07/30/2021, 3:39 PM

## 2021-07-30 NOTE — Anesthesia Postprocedure Evaluation (Signed)
Anesthesia Post Note  Patient: Norma Colon  Procedure(s) Performed: CT MICROWAVE ABLATION OF THE LIVER     Patient location during evaluation: PACU Anesthesia Type: General Level of consciousness: awake and alert, patient cooperative and oriented Pain management: pain level controlled Vital Signs Assessment: post-procedure vital signs reviewed and stable Respiratory status: spontaneous breathing, nonlabored ventilation and respiratory function stable Cardiovascular status: blood pressure returned to baseline and stable Postop Assessment: no apparent nausea or vomiting Anesthetic complications: no   No notable events documented.  Last Vitals:  Vitals:   07/30/21 1230 07/30/21 1245  BP: (!) 171/96 (!) 154/92  Pulse: 65 64  Resp: 13 14  Temp:    SpO2: 99% 95%    Last Pain:  Vitals:   07/30/21 1245  TempSrc:   PainSc: 4                  Darel Ricketts,E. Amiere Cawley

## 2021-07-30 NOTE — Anesthesia Procedure Notes (Signed)
Procedure Name: Intubation Date/Time: 07/30/2021 8:41 AM Performed by: Claudia Desanctis, CRNA Pre-anesthesia Checklist: Patient identified, Emergency Drugs available, Suction available and Patient being monitored Patient Re-evaluated:Patient Re-evaluated prior to induction Oxygen Delivery Method: Circle system utilized Preoxygenation: Pre-oxygenation with 100% oxygen Induction Type: IV induction Ventilation: Mask ventilation without difficulty and Oral airway inserted - appropriate to patient size Laryngoscope Size: 2 and Miller Grade View: Grade I Tube type: Oral Tube size: 7.0 mm Number of attempts: 1 Airway Equipment and Method: Stylet Placement Confirmation: ETT inserted through vocal cords under direct vision, positive ETCO2 and breath sounds checked- equal and bilateral Secured at: 21 cm Tube secured with: Tape Dental Injury: Teeth and Oropharynx as per pre-operative assessment

## 2021-07-30 NOTE — Transfer of Care (Signed)
Immediate Anesthesia Transfer of Care Note  Patient: Norma Colon  Procedure(s) Performed: CT MICROWAVE ABLATION OF THE LIVER  Patient Location: PACU  Anesthesia Type:General  Level of Consciousness: awake and patient cooperative  Airway & Oxygen Therapy: Patient Spontanous Breathing and Patient connected to face mask  Post-op Assessment: Report given to RN and Post -op Vital signs reviewed and stable  Post vital signs: Reviewed and stable  Last Vitals:  Vitals Value Taken Time  BP 163/121 07/30/21 1057  Temp    Pulse 78 07/30/21 1059  Resp 15 07/30/21 1059  SpO2 100 % 07/30/21 1059  Vitals shown include unvalidated device data.  Last Pain:  Vitals:   07/30/21 0734  TempSrc: Oral  PainSc: 0-No pain         Complications: No notable events documented.

## 2021-07-30 NOTE — H&P (Signed)
Chief Complaint: Metastatic adenocarcinoma with lesion in the segment 6 of the liver. Request is for microwave ablation.  Referring Physician(s): Dr. Edrick Kins  Supervising Physician: Jacqulynn Cadet  Patient Status: [facility] - Out-pt  History of Present Illness: Norma Colon is a 60 y.o. female outpatient. 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 S/p low anterior resection in 12.13.19 followed by neoadjuvant combined radiation and chemotherapy. A CT abdomen pelvis from 7.8.21 showed evidence of metastatic disease in segment 6 of her liver in CT from July 2021. This was confirmed on MRI from 8.4.21 with a second lesion found to be in the posterior aspect of segment 3. On 9.15.21 IR performed a concurrent US biopsy and microwave ablation of the 2 liver lesions. Cytology confirmed metastatic adenocarcinoma to the liver. Restaging CT CAP from 9.15.22 showed a new 1.7 cm lesion in the posterosuperior margin of the segment 6 ablation defect. She was also found to have elevated CEA both of which are concerning for recurrent disease. These findings were discussed directly to [name] via telehealth consult on 9.19.22 with Dr. Shellia Cleverly, Interventional Radiology. Different treatment options were discussed and the patient decided to pursue repeat microwave ablation.  Patient presents for repeat microwave ablation of the recurrent site of segment 6 with general anesthesia.    Currently without any significant complaints. Patient alert and laying in bed, calm and comfortable. Denies any fevers, headache, chest pain, SOB, cough, abdominal pain, nausea, vomiting or bleeding.   Return precautions and treatment recommendations and follow-up discussed with the patient who is agreeable with the plan.    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[facility]) oncologist-- dr Benay Spice--  per last note in epic ,   clinical remission   dx 08/ 2019---- chemoradiation concurrent completed 07-20-2018;  s/p  low anterior resection 09-16-2018;   chemo 10-17-2018  ot 12-19-2018   Anxiety    Arthritis    Chemotherapy induced neutropenia ([place])    Depression    Headache    migraines    History of HPV infection    History of kidney stones    Hydronephrosis, right    Restless leg syndrome    Scoliosis    Sepsis ([place])    11-2018    Past Surgical History:  Procedure Laterality Date   COLPOSCOPY  04/04/2018   CYSTOSCOPY WITH RETROGRADE PYELOGRAM, URETEROSCOPY AND STENT PLACEMENT Right 12/12/2018   Procedure: CYSTOSCOPY WITH RETROGRADE PYELOGRAM, URETEROSCOPY, STONE EXTRACTION AND STENT PLACEMENT;  Surgeon: Cleon Gustin, MD;  Location[facility];  Service: Urology;  Laterality: Right;   CYSTOSCOPY WITH RETROGRADE PYELOGRAM, URETEROSCOPY AND STENT PLACEMENT Right 04/17/2019   Procedure: CYSTOSCOPY WITH RETROGRADE PYELOGRAM, BALLOON Brady  URETEROSCOPY AND STENT PLACEMENT;  Surgeon: Cleon Gustin, MD;  Location: [facility];  Service: Urology;  Laterality: Right;   CYSTOSCOPY WITH STENT PLACEMENT Right 11/21/2018   Procedure: CYSTOSCOPY, RIGHT RETROGRADE PYELOGRAM, WITH RIGHT URETERAL STENT PLACEMENT;  Surgeon: Cleon Gustin, MD;  Location: [facility];  Service: Urology;  Laterality: Right;   IR IMAGING GUIDED PORT INSERTION  09/04/2020   IR NEPHROSTOMY PLACEMENT RIGHT  06/22/2019   IR PATIENT EVAL TECH 0-60 MINS  06/28/2019   IR RADIOLOGIST EVAL & MGMT  06/06/2020   IR RADIOLOGIST EVAL & MGMT  07/17/2020   IR RADIOLOGIST EVAL & MGMT  08/15/2020   IR RADIOLOGIST EVAL & MGMT  09/03/2020  IR RADIOLOGIST EVAL & MGMT  12/18/2020   IR RADIOLOGIST EVAL & MGMT  02/25/2021   IR RADIOLOGIST EVAL & MGMT  06/23/2021   left forearm surgery due to calcium rock      RADIOLOGY WITH ANESTHESIA N/A 06/19/2020   Procedure: CT WITH ANESTHESIA MICROWAVE ABLATION;  Surgeon:  Jacqulynn Cadet, MD;  Location: [facility];  Service: Anesthesiology;  Laterality: N/A;   ROBOT ASSISTED PYELOPLASTY Right 08/03/2019   Procedure: XI ROBOTIC ASSISTED PYELOPLASTY WITH STENT PLACEMENT;  Surgeon: Cleon Gustin, MD;  Location: [facility];  Service: Urology;  Laterality: Right;  3 HRS   SHOULDER ARTHROSCOPY W/ ROTATOR CUFF REPAIR Right    XI ROBOTIC ASSISTED LOWER ANTERIOR RESECTION N/A 09/16/2018   Procedure: XI ROBOTIC ASSISTED LOWER ANTERIOR RESECTION ERAS PATHWAY;  Surgeon: Leighton Ruff, MD;  Location: [facility];  Service: General;  Laterality: N/A;    Allergies: Prednisone and Codeine  Medications: Prior to Admission medications   Medication Sig Start Date End Date Taking? Authorizing Provider  alprazolam Duanne Moron) 2 MG tablet Take 2 mg by mouth every morning.    Yes [provider]  Ascorbic Acid (VITAMIN C) 1000 MG tablet Take 1,000 mg by mouth daily.   Yes [provider]  aspirin EC 81 MG tablet Take 81 mg by mouth every morning. Swallow whole.    Yes [provider]  Biotin 10 MG CAPS Take 10 mg by mouth daily.   Yes [provider]  buPROPion (WELLBUTRIN XL) 300 MG 24 hr tablet Take 300 mg by mouth every morning.    Yes [provider]  calcium carbonate (OSCAL) 1500 (600 Ca) MG TABS tablet Take 600 mg of elemental calcium by mouth daily.   Yes [provider]  Cholecalciferol (DIALYVITE VITAMIN D 5000) 125 MCG (5000 UT) capsule Take 5,000 Units by mouth daily.   Yes [provider]  cyclobenzaprine (FLEXERIL) 10 MG tablet Take 20 mg by mouth at bedtime.   Yes [provider]  ECHINACEA-ZINC-VITAMIN C PO Take 1 tablet by mouth daily.   Yes [provider]  HYDROcodone-acetaminophen (NORCO) 10-325 MG tablet Take 1-2 tablets by mouth every 6 (six) hours as needed for moderate pain or severe pain (headache). Post-Operatively. 08/05/19  Yes Alexis Frock, MD  lidocaine-prilocaine (EMLA) cream  Apply 1 application topically as directed. Apply 1-2 hours prior to IV stick and cover with plastic wrap 11/28/20  Yes Ladell Pier, MD  METAMUCIL FIBER PO Take 1 capsule by mouth daily.   Yes [provider]  Methylcellulose, Laxative, (CITRUCEL PO) Take 1 tablet by mouth daily.   Yes [provider]  Multiple Vitamin (MULTIVITAMIN WITH MINERALS) TABS tablet Take 1 tablet by mouth daily.   Yes [provider]  Multiple Vitamins-Minerals (IMMUNE SUPPORT PO) Take 1 tablet by mouth daily.   Yes [provider]  Omega-3 Fatty Acids (FISH OIL) 1000 MG CAPS Take 1,000 mg by mouth daily.   Yes [provider]  Prenatal Vit-Fe Fumarate-FA (PRENATAL PO) Take 1 tablet by mouth daily.   Yes [provider]  Probiotic Product (PROBIOTIC PO) Take 1 tablet by mouth every morning. Acidophilus 100 million   Yes [provider]  rOPINIRole (REQUIP) 1 MG tablet Take 1 mg by mouth at bedtime.   Yes [provider]  TURMERIC PO Take 1 tablet by mouth daily.   Yes [provider]  vitamin B-12 (CYANOCOBALAMIN) 1000 MCG tablet Take 1,000 mcg by mouth daily.  Yes [provider]  diphenoxylate-atropine (LOMOTIL) 2.5-0.025 MG tablet TAKE 1 TO 2 TABLETS BY MOUTH FOUR TIMES DAILY AS NEEDED FOR DIARRHEA OR LOOSE STOOLS 03/10/21   Owens Shark, NP  lubiprostone (AMITIZA) 24 MCG capsule Take 1 capsule (24 mcg total) by mouth 2 (two) times daily with a meal. 07/16/21 01/07/23  Cirigliano, Dominic Pea, DO     Family History  Problem Relation Age of Onset   COPD Father        smoker   Stomach cancer Father        spread to lungs and bones   Arthritis Mother    Heart attack Paternal Grandmother    Heart attack Paternal Grandfather    Heart attack Maternal Grandmother    Stroke Maternal Grandmother    Colon cancer Neg Hx    Rectal cancer Neg Hx    Esophageal cancer Neg Hx     Social History   Socioeconomic History   Marital  status: Legally Separated    Spouse name: Not on file   Number of children: 1   Years of education: Not on file   Highest education level: Not on file  Occupational History   Occupation: Land  Tobacco Use   Smoking status: Former    Packs/day: 1.00    Years: 30.00    Pack years: 30.00    Types: Cigarettes    Quit date: 09/16/2018    Years since quitting: 2.8   Smokeless tobacco: Never  Vaping Use   Vaping Use: Never used  Substance and Sexual Activity   Alcohol use: Yes    Comment: social   Drug use: Never   Sexual activity: Yes  Other Topics Concern   Not on file  Social History Narrative   Not on file   Social Determinants of Health   Financial Resource Strain: Not on file  Food Insecurity: Not on file  Transportation Needs: Not on file  Physical Activity: Not on file  Stress: Not on file  Social Connections: Not on file    Review of Systems: A 12 point ROS discussed and pertinent positives are indicated in the HPI above.  All other systems are negative.  Review of Systems  Constitutional:  Negative for fatigue and fever.  HENT:  Negative for congestion.   Respiratory:  Negative for cough and shortness of breath.   Gastrointestinal:  Negative for abdominal pain, diarrhea, nausea and vomiting.   Vital Signs: BP 115/63   Pulse 86   Temp 98.1 F (36.7 C) (Oral)   Resp 18   Ht 5' 8.5" (1.74 m)   Wt 216 lb 0.8 oz (98 kg)   LMP  (LMP Unknown)   SpO2 96%   BMI 32.37 kg/m   Physical Exam Vitals and nursing note reviewed.  Constitutional:      Appearance: She is well-developed.  HENT:     Head: Normocephalic and atraumatic.  Eyes:     Conjunctiva/sclera: Conjunctivae normal.  Cardiovascular:     Rate and Rhythm: Normal rate and regular rhythm.     Heart sounds: Normal heart sounds.  Pulmonary:     Effort: Pulmonary effort is normal.     Breath sounds: Normal breath sounds.  Musculoskeletal:     Cervical back: Normal range of motion.   Neurological:     Mental Status: She is alert and oriented to person, place, and time.    Imaging: No results found.  Labs:  CBC: Recent Labs  11/28/20 0812 01/13/21 0933 04/15/21 1531 07/15/21 1342  WBC 8.0 6.0 6.5 5.6  HGB 11.2* 12.5 14.0 13.6  HCT 33.7* 38.4 43.6 42.1  PLT 164 182 234 257    COAGS: Recent Labs    07/15/21 1342  INR 1.1    BMP: Recent Labs    11/28/20 0812 04/15/21 1405 06/17/21 1238 07/15/21 1342  NA 142 140 139 139  K 3.7 4.0 4.1 4.1  CL 105 104 102 103  CO2 24 26 29 30   GLUCOSE 154* 84 98 80  BUN 17 21* 17 19  CALCIUM 9.1 9.6 9.9 9.7  CREATININE 1.15* 1.03* 0.90 0.84  GFRNONAA 56* >60 >60 >60    LIVER FUNCTION TESTS: Recent Labs    10/30/20 1054 11/14/20 0908 11/28/20 0812 07/15/21 1342  BILITOT 0.5 0.4 0.5 0.6  AST 38 37 39 35  ALT 51* 54* 57* 39  ALKPHOS 135* 138* 133* 97  PROT 7.4 6.9 7.0 7.7  ALBUMIN 3.7 3.6 3.6 3.9    TUMOR MARKERS: Recent Labs    04/15/21 1417 06/17/21 1238  CEA 2.69 7.03*    Assessment and Plan:  60 y.o. female outpatient. 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 S/p low anterior resection in 12.13.19 followed by neoadjuvant combined radiation and chemotherapy. A CT abdomen pelvis from 7.8.21 showed evidence of metastatic disease in segment 6 of her liver in CT from July 2021. This was confirmed on MRI from 8.4.21 with a second lesion found to be in the posterior aspect of segment 3. On 9.15.21 IR performed a concurrent US biopsy and microwave ablation of the 2 liver lesions. Cytology confirmed metastatic adenocarcinoma to the liver. Restaging CT CAP from 9.15.22 showed a new 1.7 cm lesion in the posterosuperior margin of the segment 6 ablation defect. She was also found to have elevated CEA both of which are concerning for recurrent disease. These findings were discussed directly to Ms. Agerton via telehealth consult on 9.19.22  with Dr. Shellia Cleverly, Interventional Radiology. Different treatment options were discussed and the patient decided to pursue repeat microwave ablation.  Patient presents for repeat microwave ablation of the recurrent site of segment 6 with general anesthesia.    All labs and medications are within acceptable parameters. Allergies include prednisone and codeine. Patient has been NPO since midnight.  Risks and benefits of microwave ablation were discussed with the patient including, but not limited to, bleeding, infection, vascular injury, contrast induced renal failure, post procedural pain, nausea, vomiting, fatigue, progression of liver failure, chemical cholecystitis, or need for additional procedures.  This interventional procedure involves the use of X-rays and because of the nature of the planned procedure, it is possible that we will have prolonged use of X-ray fluoroscopy.  Potential radiation risks to you include (but are not limited to) the following: - A slightly elevated risk for cancer  several years later in life. This risk is typically less than 0.5% percent. This risk is low in comparison to the normal incidence of human cancer, which is 33% for women and 50% for men according to the [place]. - Radiation induced injury can include skin redness, resembling a rash, tissue breakdown / ulcers and hair loss (which can be temporary or permanent).   The likelihood of either of these occurring depends on the difficulty of the procedure and whether you are sensitive to radiation due to previous procedures, disease, or genetic conditions.   IF your  procedure requires a prolonged use of radiation, you will be notified and given written instructions for further action.  It is your responsibility to monitor the irradiated area for the 2 weeks following the procedure and to notify your physician if you are concerned that you have suffered a radiation induced injury.    All of  the patient's questions were answered, patient is agreeable to proceed.  Consent signed and in chart.   Thank you for this interesting consult.  I greatly enjoyed meeting Savana Spina and look forward to participating in their care.  A copy of this report was sent to the requesting provider on this date.  Electronically Signed: Jacqualine Mau, NP 07/30/2021, 8:06 AM   I spent a total of  40 Minutes   in face to face in clinical consultation, greater than 50% of which was counseling/coordinating care for microwave ablation

## 2021-07-31 ENCOUNTER — Inpatient Hospital Stay (HOSPITAL_COMMUNITY): Payer: 59 | Admitting: Anesthesiology

## 2021-07-31 ENCOUNTER — Encounter (HOSPITAL_COMMUNITY): Payer: Self-pay | Admitting: Interventional Radiology

## 2021-07-31 ENCOUNTER — Ambulatory Visit (AMBULATORY_SURGERY_CENTER): Payer: 59 | Admitting: *Deleted

## 2021-07-31 ENCOUNTER — Encounter (HOSPITAL_COMMUNITY)
Admission: RE | Disposition: A | Discharge status: Home / Self Care | Payer: Self-pay | Source: Ambulatory Visit | Attending: Interventional Radiology

## 2021-07-31 ENCOUNTER — Encounter (HOSPITAL_COMMUNITY): Payer: Self-pay | Admitting: Certified Registered Nurse Anesthetist

## 2021-07-31 ENCOUNTER — Observation Stay (HOSPITAL_COMMUNITY): Payer: 59

## 2021-07-31 ENCOUNTER — Inpatient Hospital Stay: Payer: 59 | Admitting: Oncology

## 2021-07-31 ENCOUNTER — Inpatient Hospital Stay (HOSPITAL_COMMUNITY): Payer: 59

## 2021-07-31 ENCOUNTER — Inpatient Hospital Stay: Payer: 59

## 2021-07-31 ENCOUNTER — Telehealth: Payer: Self-pay | Admitting: *Deleted

## 2021-07-31 VITALS — Ht 68.0 in | Wt 216.0 lb

## 2021-07-31 DIAGNOSIS — N179 Acute kidney failure, unspecified: Secondary | ICD-10-CM

## 2021-07-31 DIAGNOSIS — C20 Malignant neoplasm of rectum: Secondary | ICD-10-CM

## 2021-07-31 DIAGNOSIS — F32A Depression, unspecified: Secondary | ICD-10-CM | POA: Diagnosis present

## 2021-07-31 DIAGNOSIS — S3710XA Unspecified injury of ureter, initial encounter: Secondary | ICD-10-CM | POA: Diagnosis present

## 2021-07-31 DIAGNOSIS — Y732 Prosthetic and other implants, materials and accessory gastroenterology and urology devices associated with adverse incidents: Secondary | ICD-10-CM | POA: Diagnosis present

## 2021-07-31 DIAGNOSIS — E875 Hyperkalemia: Secondary | ICD-10-CM | POA: Diagnosis present

## 2021-07-31 DIAGNOSIS — M4317 Spondylolisthesis, lumbosacral region: Secondary | ICD-10-CM | POA: Diagnosis present

## 2021-07-31 DIAGNOSIS — Z20822 Contact with and (suspected) exposure to covid-19: Secondary | ICD-10-CM | POA: Diagnosis present

## 2021-07-31 DIAGNOSIS — Z6832 Body mass index (BMI) 32.0-32.9, adult: Secondary | ICD-10-CM | POA: Diagnosis not present

## 2021-07-31 DIAGNOSIS — Q6263 Anomalous implantation of ureter: Secondary | ICD-10-CM | POA: Diagnosis not present

## 2021-07-31 DIAGNOSIS — Z8249 Family history of ischemic heart disease and other diseases of the circulatory system: Secondary | ICD-10-CM | POA: Diagnosis not present

## 2021-07-31 DIAGNOSIS — T8383XA Hemorrhage of genitourinary prosthetic devices, implants and grafts, initial encounter: Secondary | ICD-10-CM | POA: Diagnosis present

## 2021-07-31 DIAGNOSIS — E669 Obesity, unspecified: Secondary | ICD-10-CM | POA: Diagnosis present

## 2021-07-31 DIAGNOSIS — N9989 Other postprocedural complications and disorders of genitourinary system: Secondary | ICD-10-CM | POA: Diagnosis present

## 2021-07-31 DIAGNOSIS — R310 Gross hematuria: Secondary | ICD-10-CM | POA: Diagnosis present

## 2021-07-31 DIAGNOSIS — K8020 Calculus of gallbladder without cholecystitis without obstruction: Secondary | ICD-10-CM | POA: Diagnosis present

## 2021-07-31 DIAGNOSIS — Z823 Family history of stroke: Secondary | ICD-10-CM | POA: Diagnosis not present

## 2021-07-31 DIAGNOSIS — K5900 Constipation, unspecified: Secondary | ICD-10-CM | POA: Diagnosis present

## 2021-07-31 DIAGNOSIS — G2581 Restless legs syndrome: Secondary | ICD-10-CM | POA: Diagnosis present

## 2021-07-31 DIAGNOSIS — Z635 Disruption of family by separation and divorce: Secondary | ICD-10-CM | POA: Diagnosis not present

## 2021-07-31 DIAGNOSIS — Z825 Family history of asthma and other chronic lower respiratory diseases: Secondary | ICD-10-CM | POA: Diagnosis not present

## 2021-07-31 DIAGNOSIS — Z800 Family history of malignant neoplasm of digestive organs: Secondary | ICD-10-CM | POA: Diagnosis not present

## 2021-07-31 DIAGNOSIS — M419 Scoliosis, unspecified: Secondary | ICD-10-CM | POA: Diagnosis present

## 2021-07-31 DIAGNOSIS — R390 Extravasation of urine: Secondary | ICD-10-CM | POA: Diagnosis present

## 2021-07-31 DIAGNOSIS — C787 Secondary malignant neoplasm of liver and intrahepatic bile duct: Secondary | ICD-10-CM | POA: Diagnosis present

## 2021-07-31 HISTORY — PX: CYSTOSCOPY W/ RETROGRADES: SHX1426

## 2021-07-31 LAB — CBC WITH DIFFERENTIAL/PLATELET
Abs Immature Granulocytes: 0.04 10*3/uL (ref 0.00–0.07)
Basophils Absolute: 0 10*3/uL (ref 0.0–0.1)
Basophils Relative: 0 %
Eosinophils Absolute: 0 10*3/uL (ref 0.0–0.5)
Eosinophils Relative: 0 %
HCT: 38.6 % (ref 36.0–46.0)
Hemoglobin: 12.4 g/dL (ref 12.0–15.0)
Immature Granulocytes: 0 %
Lymphocytes Relative: 7 %
Lymphs Abs: 0.9 10*3/uL (ref 0.7–4.0)
MCH: 31.2 pg (ref 26.0–34.0)
MCHC: 32.1 g/dL (ref 30.0–36.0)
MCV: 97 fL (ref 80.0–100.0)
Monocytes Absolute: 0.7 10*3/uL (ref 0.1–1.0)
Monocytes Relative: 6 %
Neutro Abs: 10.9 10*3/uL — ABNORMAL HIGH (ref 1.7–7.7)
Neutrophils Relative %: 87 %
Platelets: 228 10*3/uL (ref 150–400)
RBC: 3.98 MIL/uL (ref 3.87–5.11)
RDW: 13.4 % (ref 11.5–15.5)
WBC: 12.5 10*3/uL — ABNORMAL HIGH (ref 4.0–10.5)
nRBC: 0 % (ref 0.0–0.2)

## 2021-07-31 LAB — BASIC METABOLIC PANEL
Anion gap: 9 (ref 5–15)
BUN: 42 mg/dL — ABNORMAL HIGH (ref 6–20)
CO2: 23 mmol/L (ref 22–32)
Calcium: 8.7 mg/dL — ABNORMAL LOW (ref 8.9–10.3)
Chloride: 103 mmol/L (ref 98–111)
Creatinine, Ser: 2.75 mg/dL — ABNORMAL HIGH (ref 0.44–1.00)
GFR, Estimated: 20 mL/min — ABNORMAL LOW (ref >60)
Glucose, Bld: 146 mg/dL — ABNORMAL HIGH (ref 70–99)
Potassium: 5.1 mmol/L (ref 3.5–5.1)
Sodium: 135 mmol/L (ref 135–145)

## 2021-07-31 LAB — SURGICAL PATHOLOGY

## 2021-07-31 SURGERY — CYSTOSCOPY, WITH RETROGRADE PYELOGRAM
Anesthesia: General | Site: Ureter | Laterality: Right

## 2021-07-31 SURGERY — CYSTOURETEROSCOPY, WITH RETROGRADE PYELOGRAM AND STENT INSERTION
Anesthesia: General | Site: Ureter | Laterality: Right

## 2021-07-31 MED ORDER — PANTOPRAZOLE SODIUM 40 MG PO TBEC
40.0000 mg | DELAYED_RELEASE_TABLET | Freq: Every day | ORAL | Status: DC
  Administered 2021-07-31 – 2021-08-03 (×4): 40 mg via ORAL
  Filled 2021-07-31 (×4): qty 1

## 2021-07-31 MED ORDER — HYDROMORPHONE HCL 1 MG/ML IJ SOLN
0.2500 mg | INTRAMUSCULAR | Status: DC | PRN
  Administered 2021-07-31 (×2): 0.5 mg via INTRAVENOUS

## 2021-07-31 MED ORDER — ONDANSETRON HCL 4 MG/2ML IJ SOLN
INTRAMUSCULAR | Status: DC | PRN
  Administered 2021-07-31: 4 mg via INTRAVENOUS

## 2021-07-31 MED ORDER — PHENYLEPHRINE HCL (PRESSORS) 10 MG/ML IV SOLN
INTRAVENOUS | Status: DC | PRN
  Administered 2021-07-31 (×2): 80 ug via INTRAVENOUS
  Administered 2021-07-31: 120 ug via INTRAVENOUS
  Administered 2021-07-31 (×4): 80 ug via INTRAVENOUS

## 2021-07-31 MED ORDER — SODIUM CHLORIDE 0.9 % IV BOLUS
250.0000 mL | Freq: Once | INTRAVENOUS | Status: AC
  Administered 2021-07-31: 250 mL via INTRAVENOUS

## 2021-07-31 MED ORDER — PROMETHAZINE HCL 25 MG/ML IJ SOLN: 6.2500 mg | INTRAMUSCULAR | Status: DC | PRN

## 2021-07-31 MED ORDER — EPHEDRINE SULFATE 50 MG/ML IJ SOLN
INTRAMUSCULAR | Status: DC | PRN
  Administered 2021-07-31: 5 mg via INTRAVENOUS
  Administered 2021-07-31 (×2): 10 mg via INTRAVENOUS

## 2021-07-31 MED ORDER — OXYCODONE HCL 5 MG/5ML PO SOLN: 5.0000 mg | Freq: Once | ORAL | Status: DC | PRN

## 2021-07-31 MED ORDER — OXYCODONE HCL 5 MG PO TABS: 5.0000 mg | ORAL_TABLET | Freq: Once | ORAL | Status: DC | PRN

## 2021-07-31 MED ORDER — PROPOFOL 10 MG/ML IV BOLUS
INTRAVENOUS | Status: AC
  Filled 2021-07-31: qty 20

## 2021-07-31 MED ORDER — IOHEXOL 300 MG/ML  SOLN
INTRAMUSCULAR | Status: DC | PRN
  Administered 2021-07-31: 12 mL via URETHRAL

## 2021-07-31 MED ORDER — MEPERIDINE HCL 25 MG/ML IJ SOLN: 6.2500 mg | INTRAMUSCULAR | Status: DC | PRN

## 2021-07-31 MED ORDER — MIDAZOLAM HCL 2 MG/2ML IJ SOLN
INTRAMUSCULAR | Status: AC
  Filled 2021-07-31: qty 2

## 2021-07-31 MED ORDER — MIDAZOLAM HCL 5 MG/5ML IJ SOLN
INTRAMUSCULAR | Status: DC | PRN
  Administered 2021-07-31: 2 mg via INTRAVENOUS

## 2021-07-31 MED ORDER — DEXAMETHASONE SODIUM PHOSPHATE 4 MG/ML IJ SOLN
INTRAMUSCULAR | Status: DC | PRN
  Administered 2021-07-31: 5 mg via INTRAVENOUS

## 2021-07-31 MED ORDER — NA SULFATE-K SULFATE-MG SULF 17.5-3.13-1.6 GM/177ML PO SOLN: 1.0000 | ORAL | 0 refills | Status: DC

## 2021-07-31 MED ORDER — LIDOCAINE 2% (20 MG/ML) 5 ML SYRINGE
INTRAMUSCULAR | Status: AC
  Filled 2021-07-31: qty 5

## 2021-07-31 MED ORDER — HYDROMORPHONE HCL 1 MG/ML IJ SOLN
INTRAMUSCULAR | Status: AC
  Filled 2021-07-31: qty 1

## 2021-07-31 MED ORDER — PROPOFOL 10 MG/ML IV BOLUS
INTRAVENOUS | Status: DC | PRN
  Administered 2021-07-31: 50 mg via INTRAVENOUS
  Administered 2021-07-31: 150 mg via INTRAVENOUS

## 2021-07-31 MED ORDER — SUGAMMADEX SODIUM 200 MG/2ML IV SOLN
INTRAVENOUS | Status: DC | PRN
  Administered 2021-07-31: 200 mg via INTRAVENOUS

## 2021-07-31 MED ORDER — CEFAZOLIN SODIUM-DEXTROSE 2-3 GM-%(50ML) IV SOLR
INTRAVENOUS | Status: DC | PRN
  Administered 2021-07-31: 2 g via INTRAVENOUS

## 2021-07-31 MED ORDER — MIDAZOLAM HCL 2 MG/2ML IJ SOLN: 0.5000 mg | Freq: Once | INTRAMUSCULAR | Status: DC | PRN

## 2021-07-31 MED ORDER — ROCURONIUM BROMIDE 100 MG/10ML IV SOLN
INTRAVENOUS | Status: DC | PRN
  Administered 2021-07-31: 50 mg via INTRAVENOUS

## 2021-07-31 MED ORDER — LIDOCAINE 2% (20 MG/ML) 5 ML SYRINGE
INTRAMUSCULAR | Status: DC | PRN
  Administered 2021-07-31: 20 mg via INTRAVENOUS

## 2021-07-31 MED ORDER — LACTATED RINGERS IV SOLN: INTRAVENOUS | Status: DC | PRN

## 2021-07-31 MED ORDER — 0.9 % SODIUM CHLORIDE (POUR BTL) OPTIME
TOPICAL | Status: DC | PRN
Start: 2021-07-31 — End: 2021-08-01
  Administered 2021-07-31: 1000 mL

## 2021-07-31 MED ORDER — SODIUM CHLORIDE 0.9 % IR SOLN
Status: DC | PRN
  Administered 2021-07-31: 3000 mL

## 2021-07-31 MED ORDER — FENTANYL CITRATE (PF) 250 MCG/5ML IJ SOLN
INTRAMUSCULAR | Status: AC
  Filled 2021-07-31: qty 5

## 2021-07-31 MED ORDER — FENTANYL CITRATE (PF) 100 MCG/2ML IJ SOLN
INTRAMUSCULAR | Status: DC | PRN
  Administered 2021-07-31 (×3): 50 ug via INTRAVENOUS

## 2021-07-31 SURGICAL SUPPLY — 24 items
BAG URINE DRAIN 2000ML AR STRL (UROLOGICAL SUPPLIES) ×3 IMPLANT
BAG URO CATCHER STRL LF (MISCELLANEOUS) ×3 IMPLANT
CATH FOLEY 2WAY  5CC 16FR SIL (CATHETERS) ×3
CATH FOLEY 2WAY 5CC 16FR (CATHETERS) ×3
CATH FOLEY 2WAY 5CC 16FR SIL (CATHETERS) ×1 IMPLANT
CATH INTERMIT  6FR 70CM (CATHETERS) IMPLANT
CATH URET 5FR 28IN OPEN ENDED (CATHETERS) IMPLANT
CATH URTH STD 16FR FL 2W DRN (CATHETERS) ×1 IMPLANT
GLOVE EXAM NITRILE PF LG BLUE (GLOVE) ×3 IMPLANT
GLOVE SURG ENC MOIS LTX SZ7.5 (GLOVE) ×3 IMPLANT
GLOVE SURG ENC MOIS LTX SZ8 (GLOVE) ×3 IMPLANT
GLOVE SURG ENC TEXT LTX SZ7 (GLOVE) ×3 IMPLANT
GOWN STRL REUS W/TWL LRG LVL3 (GOWN DISPOSABLE) ×3 IMPLANT
GUIDEWIRE ANG ZIPWIRE 038X150 (WIRE) IMPLANT
GUIDEWIRE STR DUAL SENSOR (WIRE) ×6 IMPLANT
IV CATH 14GX2 1/4 (CATHETERS) IMPLANT
KIT TURNOVER KIT A (KITS) IMPLANT
MANIFOLD NEPTUNE II (INSTRUMENTS) ×3 IMPLANT
PACK CYSTO (CUSTOM PROCEDURE TRAY) ×3 IMPLANT
STENT CONTOUR 6FRX24X.038 (STENTS) IMPLANT
STENT CONTOUR 6FRX26X.038 (STENTS) ×3 IMPLANT
STENT URET 6FRX26 CONTOUR (STENTS) ×3 IMPLANT
TUBING CONNECTING 10 (TUBING) ×2 IMPLANT
TUBING CONNECTING 10' (TUBING) ×1

## 2021-07-31 NOTE — Consult Note (Signed)
Urology Consult   Physician requesting consult: Dr. Jacqulynn Cadet, MD  Reason for consult: Right ureteral injury  History of Present Illness: Norma Colon is a 60 y.o. female with a history of a right ureteral stricture as result of urolithiasis and possibly rectal cancer s/p low anterior resection in 09/2018 followed by adjuvant chemotherapy and radiation.  She underwent a right robotic assisted laparoscopic ureteroneocystotomy with psoas hitch by Dr. Alyson Ingles on 08/03/2019.  The ureter was implanted at the dome of the bladder.  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  She has been receiving microwave ablations to the lesions by Dr. Laurence Ferrari.  She underwent a repeat microwave ablation on 07/30/2021.  A Foley catheter was placed perioperatively.  Her foley catheter was removed after the procedure in recovery that caused severe pain.  She was noted to have gross hematuria and was unable to void for prolonged period of time with only 120 mL bladder scanned.  She underwent intermittent catheterizations with return of clear yellow urine.  She was found to have an AKI with creatinine 2.75 from a baseline of less than 1.  CT A/P 07/31/2021 demonstrated fluid in her abdomen pelvis with subtle area of extravasated urine adjacent to the right ureteral vesicle junction at the dome of her bladder concerning for a distal right ureteral injury and ureteral leak.  Retrospective review of intraoperative imaging during microwave ablation did demonstrate some right hydronephrosis.  This could be explained by Foley catheter being inadvertently placed into the right ureter during the case.  She complains of lower abdominal pain, presently well controlled. Denies nausea or emesis. She notes constipation. Denies fevers or chills.  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[facility]) oncologist-- dr Benay Spice--  per last note in epic ,  clinical  remission   dx 08/ 2019---- chemoradiation concurrent completed 07-20-2018;  s/p  low anterior resection 09-16-2018;   chemo 10-17-2018  ot 12-19-2018   Anxiety    Arthritis    Chemotherapy induced neutropenia ([place])    Depression    Headache    migraines    History of HPV infection    History of kidney stones    Hydronephrosis, right    Restless leg syndrome    Scoliosis    Sepsis ([place])    11-2018    Past Surgical History:  Procedure Laterality Date   COLONOSCOPY     COLPOSCOPY  04/04/2018   CYSTOSCOPY WITH RETROGRADE PYELOGRAM, URETEROSCOPY AND STENT PLACEMENT Right 12/12/2018   Procedure: CYSTOSCOPY WITH RETROGRADE PYELOGRAM, URETEROSCOPY, STONE EXTRACTION AND STENT PLACEMENT;  Surgeon: Cleon Gustin, MD;  Location: [facility];  Service: Urology;  Laterality: Right;   CYSTOSCOPY WITH RETROGRADE PYELOGRAM, URETEROSCOPY AND STENT PLACEMENT Right 04/17/2019   Procedure: CYSTOSCOPY WITH RETROGRADE PYELOGRAM, BALLOON [place]  URETEROSCOPY AND STENT PLACEMENT;  Surgeon: Cleon Gustin, MD;  Location: [facility];  Service: Urology;  Laterality: Right;   CYSTOSCOPY WITH STENT PLACEMENT Right 11/21/2018   Procedure: CYSTOSCOPY, RIGHT RETROGRADE PYELOGRAM, WITH RIGHT URETERAL STENT PLACEMENT;  Surgeon: Cleon Gustin, MD;  Location: [facility];  Service: Urology;  Laterality: Right;   IR IMAGING GUIDED PORT INSERTION  09/04/2020   IR NEPHROSTOMY PLACEMENT RIGHT  06/22/2019   IR PATIENT EVAL TECH 0-60 MINS  06/28/2019   IR RADIOLOGIST EVAL & MGMT  06/06/2020   IR RADIOLOGIST EVAL & MGMT  07/17/2020   IR RADIOLOGIST EVAL & MGMT  08/15/2020   IR RADIOLOGIST EVAL & MGMT  09/03/2020   IR RADIOLOGIST EVAL & MGMT  12/18/2020   IR RADIOLOGIST EVAL & MGMT  02/25/2021   IR RADIOLOGIST EVAL & MGMT  06/23/2021   left forearm surgery due to calcium rock      RADIOLOGY WITH ANESTHESIA N/A 06/19/2020   Procedure: CT WITH ANESTHESIA MICROWAVE  ABLATION;  Surgeon: Jacqulynn Cadet, MD;  Location: [facility];  Service: Anesthesiology;  Laterality: N/A;   RADIOLOGY WITH ANESTHESIA N/A 07/30/2021   Procedure: CT MICROWAVE ABLATION OF THE LIVER;  Surgeon: Criselda Peaches, MD;  Location: [facility];  Service: Radiology;  Laterality: N/A;   ROBOT ASSISTED PYELOPLASTY Right 08/03/2019   Procedure: XI ROBOTIC ASSISTED PYELOPLASTY WITH STENT PLACEMENT;  Surgeon: Cleon Gustin, MD;  Location: [facility];  Service: Urology;  Laterality: Right;  3 HRS   SHOULDER ARTHROSCOPY W/ ROTATOR CUFF REPAIR Right    XI ROBOTIC ASSISTED LOWER ANTERIOR RESECTION N/A 09/16/2018   Procedure: XI ROBOTIC ASSISTED LOWER ANTERIOR RESECTION ERAS PATHWAY;  Surgeon: Leighton Ruff, MD;  Location: [facility];  Service: General;  Laterality: N/A;     Current Hospital Medications:  Home meds:  No current facility-administered medications on file prior to encounter.   Current Outpatient Medications on File Prior to Encounter  Medication Sig Dispense Refill   alprazolam (XANAX) 2 MG tablet Take 2 mg by mouth every morning.      Ascorbic Acid (VITAMIN C) 1000 MG tablet Take 1,000 mg by mouth daily.     aspirin EC 81 MG tablet Take 81 mg by mouth every morning. Swallow whole.      Biotin 10 MG CAPS Take 10 mg by mouth daily.     buPROPion (WELLBUTRIN XL) 300 MG 24 hr tablet Take 300 mg by mouth every morning.      calcium carbonate (OSCAL) 1500 (600 Ca) MG TABS tablet Take 600 mg of elemental calcium by mouth daily.     Cholecalciferol (DIALYVITE VITAMIN D 5000) 125 MCG (5000 UT) capsule Take 5,000 Units by mouth daily.     cyclobenzaprine (FLEXERIL) 10 MG tablet Take 20 mg by mouth at bedtime.     ECHINACEA-ZINC-VITAMIN C PO Take 1 tablet by mouth daily.     HYDROcodone-acetaminophen (NORCO) 10-325 MG tablet Take 1-2 tablets by mouth every 6 (six) hours as needed for moderate pain or severe pain (headache). Post-Operatively. 20 tablet 0   lidocaine-prilocaine (EMLA) cream  Apply 1 application topically as directed. Apply 1-2 hours prior to IV stick and cover with plastic wrap 30 g 1   METAMUCIL FIBER PO Take 1 capsule by mouth daily.     Methylcellulose, Laxative, (CITRUCEL PO) Take 1 tablet by mouth daily.     Multiple Vitamin (MULTIVITAMIN WITH MINERALS) TABS tablet Take 1 tablet by mouth daily.     Multiple Vitamins-Minerals (IMMUNE SUPPORT PO) Take 1 tablet by mouth daily.     Omega-3 Fatty Acids (FISH OIL) 1000 MG CAPS Take 1,000 mg by mouth daily.     Prenatal Vit-Fe Fumarate-FA (PRENATAL PO) Take 1 tablet by mouth daily.     Probiotic Product (PROBIOTIC PO) Take 1 tablet by mouth every morning. Acidophilus 100 million     rOPINIRole (REQUIP) 1 MG tablet Take 1 mg by mouth at bedtime.     TURMERIC PO Take 1 tablet by mouth daily.     vitamin B-12 (CYANOCOBALAMIN) 1000 MCG tablet Take 1,000 mcg by mouth daily.     diphenoxylate-atropine (  LOMOTIL) 2.5-0.025 MG tablet TAKE 1 TO 2 TABLETS BY MOUTH FOUR TIMES DAILY AS NEEDED FOR DIARRHEA OR LOOSE STOOLS (Patient not taking: Reported on 07/31/2021) 30 tablet 0     Scheduled Meds:  alprazolam  2 mg Oral q morning   aspirin EC  81 mg Oral q morning   buPROPion  300 mg Oral q morning   cyclobenzaprine  20 mg Oral QHS   docusate sodium  100 mg Oral BID   lubiprostone  24 mcg Oral BID WC   pantoprazole  40 mg Oral Daily   psyllium  1 packet Oral Daily   rOPINIRole  1 mg Oral QHS   Continuous Infusions:  sodium chloride 75 mL/hr at 07/31/21 0746   PRN Meds:.diphenoxylate-atropine, HYDROcodone-acetaminophen, HYDROmorphone (DILAUDID) injection, ondansetron (ZOFRAN) IV  Allergies:  Allergies  Allergen Reactions   Prednisone Other (See Comments)    "Felt like she was crazy" if on it more than 1 week   Codeine Other (See Comments)    Causes migraine--patient requests this be kept on allergy list    Family History  Problem Relation Age of Onset   COPD Father        smoker   Stomach cancer Father         spread to lungs and bones   Arthritis Mother    Heart attack Paternal Grandmother    Heart attack Paternal Grandfather    Heart attack Maternal Grandmother    Stroke Maternal Grandmother    Colon cancer Neg Hx    Rectal cancer Neg Hx    Esophageal cancer Neg Hx     Social History:  reports that she quit smoking about 2 years ago. Her smoking use included cigarettes. She has a 30.00 pack-year smoking history. She has never used smokeless tobacco. She reports current alcohol use. She reports that she does not use drugs.  ROS: A complete review of systems was performed.  All systems are negative except for pertinent findings as noted.  Physical Exam:  Vital signs in last 24 hours: Temp:  [97.6 F (36.4 C)-98 F (36.7 C)] 98 F (36.7 C) (10/27 1354) Pulse Rate:  [71-90] 87 (10/27 1354) Resp:  [17-18] 17 (10/27 1354) BP: (106-139)/(70-98) 139/98 (10/27 1354) SpO2:  [95 %-99 %] 97 % (10/27 1354) Weight:  [98 kg] 98 kg (10/27 1215) Constitutional:  Alert and oriented, No acute distress Cardiovascular: Regular rate and rhythm Respiratory: Normal respiratory effort, Lungs clear bilaterally GI: Abdomen is soft, nontender, nondistended, no abdominal masses GU: No CVA tenderness Neurologic: Grossly intact, no focal deficits Psychiatric: Normal mood and affect  Laboratory Data:  Recent Labs    07/31/21 0527  WBC 12.5*  HGB 12.4  HCT 38.6  PLT 228    Recent Labs    07/30/21 0857 07/31/21 0528  NA  --  135  K  --  5.1  CL  --  103  GLUCOSE  --  146*  BUN  --  42*  CALCIUM  --  8.7*  CREATININE 3.00* 2.75*     Results for orders placed or performed during the hospital encounter of 07/30/21 (from the past 24 hour(s))  CBC with Differential/Platelet     Status: Abnormal   Collection Time: 07/31/21  5:27 AM  Result Value Ref Range   WBC 12.5 (H) 4.0 - 10.5 K/uL   RBC 3.98 3.87 - 5.11 MIL/uL   Hemoglobin 12.4 12.0 - 15.0 g/dL   HCT 38.6 36.0 - 46.0 %  MCV 97.0 80.0 -  100.0 fL   MCH 31.2 26.0 - 34.0 pg   MCHC 32.1 30.0 - 36.0 g/dL   RDW 13.4 11.5 - 15.5 %   Platelets 228 150 - 400 K/uL   nRBC 0.0 0.0 - 0.2 %   Neutrophils Relative % 87 %   Neutro Abs 10.9 (H) 1.7 - 7.7 K/uL   Lymphocytes Relative 7 %   Lymphs Abs 0.9 0.7 - 4.0 K/uL   Monocytes Relative 6 %   Monocytes Absolute 0.7 0.1 - 1.0 K/uL   Eosinophils Relative 0 %   Eosinophils Absolute 0.0 0.0 - 0.5 K/uL   Basophils Relative 0 %   Basophils Absolute 0.0 0.0 - 0.1 K/uL   Immature Granulocytes 0 %   Abs Immature Granulocytes 0.04 0.00 - 0.07 K/uL  Basic metabolic panel     Status: Abnormal   Collection Time: 07/31/21  5:28 AM  Result Value Ref Range   Sodium 135 135 - 145 mmol/L   Potassium 5.1 3.5 - 5.1 mmol/L   Chloride 103 98 - 111 mmol/L   CO2 23 22 - 32 mmol/L   Glucose, Bld 146 (H) 70 - 99 mg/dL   BUN 42 (H) 6 - 20 mg/dL   Creatinine, Ser 2.75 (H) 0.44 - 1.00 mg/dL   Calcium 8.7 (L) 8.9 - 10.3 mg/dL   GFR, Estimated 20 (L) >60 mL/min   Anion gap 9 5 - 15   Recent Results (from the past 240 hour(s))  SARS Coronavirus 2 (TAT 6-24 hrs)     Status: None   Collection Time: 07/28/21 12:00 AM  Result Value Ref Range Status   SARS Coronavirus 2 RESULT: NEGATIVE  Final    Comment: RESULT: NEGATIVESARS-CoV-2 INTERPRETATION:A NEGATIVE  test result means that SARS-CoV-2 RNA was not present in the specimen above the limit of detection of this test. This does not preclude a possible SARS-CoV-2 infection and should not be used as the  sole basis for patient management decisions. Negative results must be combined with clinical observations, patient history, and epidemiological information. Optimum specimen types and timing for peak viral levels during infections caused by SARS-CoV-2  have not been determined. Collection of multiple specimens or types of specimens may be necessary to detect virus. Improper specimen collection and handling, sequence variability under primers/probes, or organism  present below the limit of detection may  lead to false negative results. Positive and negative predictive values of testing are highly dependent on prevalence. False negative test results are more likely when prevalence of disease is high.The expected result is NEGATIVE.Fact S heet for  Healthcare Providers: LocalChronicle.no Sheet for Patients: SalonLookup.es Reference Range - Negative     Renal Function: Recent Labs    07/30/21 0857 07/31/21 0528  CREATININE 3.00* 2.75*   Estimated Creatinine Clearance: 27.9 mL/min (A) (by C-G formula based on SCr of 2.75 mg/dL (H)).  Radiologic Imaging: CT ABDOMEN PELVIS WO CONTRAST  Result Date: 07/31/2021 CLINICAL DATA:  Lower abdominal pain with acute renal failure. Suprapubic pain. 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 EXAM: CT ABDOMEN AND PELVIS WITHOUT CONTRAST TECHNIQUE: Multidetector CT imaging of the abdomen and pelvis was performed following the standard protocol without IV contrast. COMPARISON:  06/17/2021 FINDINGS: Lower chest: Basilar atelectasis bilaterally. Hepatobiliary: Ablation defect noted posterior right liver, more pronounced than on the prior study with posterior high attenuation material likely treatment related or small focus of hemorrhage. Similar appearance of reported ablation defect lateral segment left liver. The liver  shows diffusely decreased attenuation suggesting fat deposition. Tiny hyperenhancing lesion medial segment left liver seen on the previous study is not evident on today's exam. High attenuation material in the gallbladder lumen is compatible with vicarious excretion of contrast material. Gallstones evident. No intrahepatic or extrahepatic biliary dilation. Pancreas: No focal mass lesion. No dilatation of the main duct. No intraparenchymal cyst. No peripancreatic edema. Spleen: No splenomegaly. No focal mass lesion. Adrenals/Urinary Tract: No adrenal  nodule or mass. Contrast excretion of both kidneys compatible with contrast material from yesterday's CT scan. No evidence for hydroureter. The urinary bladder appears normal for the degree of distention. Stomach/Bowel: Stomach is moderately distended with food and fluid. Duodenum is normally positioned as is the ligament of Treitz. No small bowel wall thickening. No small bowel dilatation. The terminal ileum is normal. The appendix is not well visualized, but there is no edema or inflammation in the region of the cecum. Prominent stool volume right and transverse colon. Suture line noted in the rectum. Vascular/Lymphatic: No abdominal aortic aneurysm. No abdominal lymphadenopathy. No pelvic sidewall lymphadenopathy. Reproductive: No adnexal mass. Other: Since yesterday's study, there is moderate volume high attenuation free fluid in the abdomen and pelvis, seen around the liver and spleen, within the mesentery, and in the cul-de-sac. Attenuation of the fluid is higher than would be expected for serous fluid suggesting hemoperitoneum or opacified fluid. Of note, there is fairly high attenuation fluid in the left lateral pelvis adjacent to the acetabulum and in the posterior cul-de-sac to the right of the rectum. Musculoskeletal: No worrisome lytic or sclerotic osseous abnormality. L5-S1 spondylolisthesis is similar to prior. IMPRESSION: 1. Interval development of moderate volume high attenuation free fluid in the abdomen and pelvis. Attenuation of the fluid is higher than would be expected for serous fluid. I discussed this case with Dr. Laurence Ferrari at the time of interpretation. Of note, the patient developed progressive right-sided hydronephrosis and proximal hydroureter during the 1 hour ablation procedure yesterday, well demonstrated on localization scans performed on that study. On today's study, the right ureter can be seen to have an ectopic insertion high on the posterior dome of the bladder. The distal right  ureter just proximal to the UVJ is irregular and there appears to be a string like subtle band of extravasated urine adjacent to the right UVJ. As such, these findings are concerning for distal right ureteral injury and ureteral leak as the etiology of the new intraperitoneal fluid. Given that the free fluid is intraperitoneal, this would suggest that the right UVJ is not retroperitoneal in location. However, the patient was already excreting contrast into the collecting systems and ureters by the end of yesterday's study and the intraperitoneal fluid today is not substantially opacified, possibly secondary to some reabsorption of the contrast material via the peritoneum although a component of hemoperitoneum could also have this appearance and cannot be entirely excluded on this exam. 2. Posterior right hepatic lobe ablation defect is increased in size in the interval, consistent with retreatment. Similar appearance of ablation defect in the lateral segment left liver. 3. Cholelithiasis. 4. Prominent stool volume right and transverse colon. Imaging features could be compatible with clinical constipation. 5. L5-S1 spondylolisthesis, similar to prior. Electronically Signed   By: Misty Stanley M.D.   On: 07/31/2021 15:53   US RENAL  Result Date: 07/31/2021 CLINICAL DATA:  Right hydronephrosis EXAM: RENAL / URINARY TRACT ULTRASOUND COMPLETE COMPARISON:  None. FINDINGS: Right Kidney: Renal measurements: 9.8 x 5.6 x 6.0 cm = volume: 172  mL. Echogenicity within normal limits. No mass or hydronephrosis visualized. Left Kidney: Renal measurements: 9.9 x 5.0 x 4.7 cm = volume: 121 mL. Echogenicity within normal limits. No mass or hydronephrosis visualized. Bladder: Appears normal for degree of bladder distention. Other: Small amount of free fluid seen in the left flank. IMPRESSION: No hydronephrosis. Small amount of free fluid seen in the left flank. Electronically Signed   By: Yetta Glassman M.D.   On: 07/31/2021  10:01   CT GUIDE TISSUE ABLATION  Result Date: 07/30/2021 INDICATION: 60 year old female with a history of rectal adenocarcinoma with hepatic metastatic disease. She has imaging and biochemical evidence of marginal recurrence at the previously treated ablation site in hepatic segment 6. She presents today for biopsy and repeat percutaneous thermal ablation. EXAM: 1. Ultrasound-guided biopsy hepatic lesion 2. CT and ultrasound-guided percutaneous microwave ablation COMPARISON:  CT abdomen/pelvis 06/17/2021 MEDICATIONS: None. ANESTHESIA/SEDATION: General - as administered by the Anesthesia department FLUOROSCOPY TIME:  None. COMPLICATIONS: None immediate. TECHNIQUE: Informed written consent was obtained from the patient after a thorough discussion of the procedural risks, benefits and alternatives. All questions were addressed. Maximal Sterile Barrier Technique was utilized including caps, mask, sterile gowns, sterile gloves, sterile drape, hand hygiene and skin antiseptic. A timeout was performed prior to the initiation of the procedure. The patient was placed on the CT gantry. Initial nonenhanced CT imaging was performed. The prior ablation defect is visible. Unfortunately, the liver lesion is essentially occult by noncontrast enhanced CT imaging. Therefore, additional CT imaging was performed with intravenous contrast. This confirms the presence of the mass which has enlarged compared to the relatively recent prior imaging. The mass now measures approximately 3 x 3 x 3 cm. The right upper quadrant was also interrogated with ultrasound. The mass is readily visible by ultrasound. First, biopsy was performed. A suitable skin entry site was selected and marked. A small dermatotomy was made. Under real-time ultrasound guidance, an 18 gauge trocar needle was advanced into the margin the mass. Multiple 18 gauge core biopsies were then obtained coaxially using a bio Pince automated biopsy device. Biopsy specimens were  placed in formalin and delivered to pathology for further analysis. Next, 2 NeuWave PR XT probes were selected. The probes were then advanced and positioned within the cephalad and more caudal aspects of the lesion in parallel separated by approximately 1.5 cm. Following adequate placement of the probes, thermal ablation was then performed. Both probes were powered at 65 watts in the ablation was continued for approximately 6 minutes. The ablation zone was monitored both by ultrasound and also CT imaging at approximately 5 minutes. The ablation zone appears to encompass the entirety of the tumor. Following successful ablation, the probes were removed. Repeat CT imaging was then performed with and without intravenous contrast. Adequate ablation of the region of tumor. No evidence of immediate complication. FINDINGS: Successful percutaneous tumor ablation without evidence of complication. IMPRESSION: 1. Ultrasound-guided core biopsy of presumed recurrent liver metastasis. 2. Successful percutaneous thermal ablation. Electronically Signed   By: Jacqulynn Cadet M.D.   On: 07/30/2021 12:10   CT BIOPSY  Result Date: 07/30/2021 INDICATION: 60 year old female with a history of rectal adenocarcinoma with hepatic metastatic disease. She has imaging and biochemical evidence of marginal recurrence at the previously treated ablation site in hepatic segment 6. She presents today for biopsy and repeat percutaneous thermal ablation. EXAM: 1. Ultrasound-guided biopsy hepatic lesion 2. CT and ultrasound-guided percutaneous microwave ablation COMPARISON:  CT abdomen/pelvis 06/17/2021 MEDICATIONS: None. ANESTHESIA/SEDATION: General - as  administered by the Anesthesia department FLUOROSCOPY TIME:  None. COMPLICATIONS: None immediate. TECHNIQUE: Informed written consent was obtained from the patient after a thorough discussion of the procedural risks, benefits and alternatives. All questions were addressed. Maximal Sterile Barrier  Technique was utilized including caps, mask, sterile gowns, sterile gloves, sterile drape, hand hygiene and skin antiseptic. A timeout was performed prior to the initiation of the procedure. The patient was placed on the CT gantry. Initial nonenhanced CT imaging was performed. The prior ablation defect is visible. Unfortunately, the liver lesion is essentially occult by noncontrast enhanced CT imaging. Therefore, additional CT imaging was performed with intravenous contrast. This confirms the presence of the mass which has enlarged compared to the relatively recent prior imaging. The mass now measures approximately 3 x 3 x 3 cm. The right upper quadrant was also interrogated with ultrasound. The mass is readily visible by ultrasound. First, biopsy was performed. A suitable skin entry site was selected and marked. A small dermatotomy was made. Under real-time ultrasound guidance, an 18 gauge trocar needle was advanced into the margin the mass. Multiple 18 gauge core biopsies were then obtained coaxially using a bio Pince automated biopsy device. Biopsy specimens were placed in formalin and delivered to pathology for further analysis. Next, 2 NeuWave PR XT probes were selected. The probes were then advanced and positioned within the cephalad and more caudal aspects of the lesion in parallel separated by approximately 1.5 cm. Following adequate placement of the probes, thermal ablation was then performed. Both probes were powered at 65 watts in the ablation was continued for approximately 6 minutes. The ablation zone was monitored both by ultrasound and also CT imaging at approximately 5 minutes. The ablation zone appears to encompass the entirety of the tumor. Following successful ablation, the probes were removed. Repeat CT imaging was then performed with and without intravenous contrast. Adequate ablation of the region of tumor. No evidence of immediate complication. FINDINGS: Successful percutaneous tumor ablation  without evidence of complication. IMPRESSION: 1. Ultrasound-guided core biopsy of presumed recurrent liver metastasis. 2. Successful percutaneous thermal ablation. Electronically Signed   By: Jacqulynn Cadet M.D.   On: 07/30/2021 12:10    I independently reviewed the above imaging studies.  Impression/Recommendation: Right distal ureteral injury: Presumed to be due to inadvertent placement of Foley catheter into her ectopic reimplanted ureter and subsequently balloon dilated with a Foley catheter balloon.  CT A/P 07/31/2021 with extravasation of urine adjacent to the right distal ureter and free fluid in the pelvis. Psuedo-azotemia due to intraperitoneal urine leakage: creatinine 2.75 at time of consultation from baseline <1. Metastatic rectal cancer s/p low anterior resection in 09/2018 followed by adjuvant chemotherapy and radiation and undergoing microwave ablations to liver mets  -Plan to take to OR for cysto, R RPG, R stent placement. If unable to place, will consult IR to place either PCNU or PCN. -Will leave foley catheter to gravity drainage  -The risks, benefits and alternatives of cystoscopy with right JJ stent placement was discussed with the patient.  Risks include, but are not limited to: bleeding, urinary tract infection, ureteral injury, ureteral stricture disease, chronic pain, urinary symptoms, bladder injury, stent migration, the need for nephrostomy tube placement, MI, CVA, DVT, PE and the inherent risks with general anesthesia.  The patient voices understanding and wishes to proceed.   Matt R. Analleli Gierke MD 07/31/2021, 6:40 PM  [facility]  Pager: [phone]   CC: Dr. Jacqulynn Cadet, MD

## 2021-07-31 NOTE — Telephone Encounter (Signed)
Dr.Cirigliano,  I called this patient to do a pre-visit for her upcoming colonoscopy on 11/15. She tells me she is currently in the hospital just had liver surgery yesterday. She is in a lot of pain and has not had a BM for 2 days. She wants you to know. Ok to proceed with colonoscopy on 11/15?

## 2021-07-31 NOTE — Progress Notes (Signed)
Spoke with MD on call. Bolusing patient 250cc's and will get labs in am. Roderick Pee

## 2021-07-31 NOTE — Consult Note (Addendum)
Medical Consultation  Norma Colon XNT:[id number] DOB: 02/11/1961 DOA: 07/30/2021 PCP: Dian Queen, MD   Requesting physician: Dr. Laurence Ferrari Date of consultation: 07/31/21 Reason for consultation: AKI  Impression/Recommendations AKI     - no previous history of kidney disease; renal fxn a couple of weeks ago was normal     - Scr was 3 yesterday and is 2.75 today     - renal US is negative; per nursing report, her bladder scans have been low     - she reports traumatic foley removal; question if she is having some spasms when attempting to urinate     - fluid challenge     - CT ab/pelvis pending     - nephrology onboard, appreciate assistance  Suprapubic pain     - she describes it more as spasms when trying to urinate     - CT ab/pelvis ordered; let's see what it shows before deciding on course of action  Metastatic rectal cancer     - admitted to IR team for microwave ablation and bx yesterday     - continue care per primary team  Remainder per primary team.  TRH will follow-up again tomorrow. Please contact me if I can be of assistance in the meanwhile. Thank you for this consultation.  Chief Complaint: elevated CEA and liver mass  HPI:  Norma Colon is a 60 y.o. female with medical history significant of metastatic rectal cancer, anxiety, migraine headaches. Presenting with elevated CEA and liver mass. Admitted to IR service for microwave ablation and biopsy. She successfully completed that procedure yesterday. See IR note for details.  TRH is consulted today for AKI and suprapubic pain. The patient had a normal Scr/BUN on pre-admission testing on 10/11 (Scr was 0.84). Before her procedure yesterday, her labs showed an Scr of 3.00. This morning it is 2.75. Imaging thus far does not show hydronephrosis. Her bladder scans have been negative. She was started on fluids this morning. Nephrology was consulted. The patient has also complained of pain w/ straining  when she tries to urinate. She reports that when her foley cath was removed yesterday, she had some trauma. She has hurt ever since and it has been hard to urinary since. She denies any other aggravating or alleviating factors.      Review of Systems:  ROS is negative for all not mentioned in HPI.  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[facility]) oncologist-- dr Benay Spice--  per last note in epic ,  clinical remission   dx 08/ 2019---- chemoradiation concurrent completed 07-20-2018;  s/p  low anterior resection 09-16-2018;   chemo 10-17-2018  ot 12-19-2018   Anxiety    Arthritis    Chemotherapy induced neutropenia ([place])    Depression    Headache    migraines    History of HPV infection    History of kidney stones    Hydronephrosis, right    Restless leg syndrome    Scoliosis    Sepsis ([place])    11-2018   Past Surgical History:  Procedure Laterality Date   COLPOSCOPY  04/04/2018   CYSTOSCOPY WITH RETROGRADE PYELOGRAM, URETEROSCOPY AND STENT PLACEMENT Right 12/12/2018   Procedure: CYSTOSCOPY WITH RETROGRADE PYELOGRAM, URETEROSCOPY, STONE EXTRACTION AND STENT PLACEMENT;  Surgeon: Cleon Gustin, MD;  Location: [facility];  Service: Urology;  Laterality: Right;   CYSTOSCOPY WITH RETROGRADE PYELOGRAM, URETEROSCOPY AND STENT PLACEMENT Right 04/17/2019   Procedure: CYSTOSCOPY WITH RETROGRADE PYELOGRAM, BALLOON  [place]  URETEROSCOPY AND STENT PLACEMENT;  Surgeon: Cleon Gustin, MD;  Location: [facility];  Service: Urology;  Laterality: Right;   CYSTOSCOPY WITH STENT PLACEMENT Right 11/21/2018   Procedure: CYSTOSCOPY, RIGHT RETROGRADE PYELOGRAM, WITH RIGHT URETERAL STENT PLACEMENT;  Surgeon: Cleon Gustin, MD;  Location: [facility];  Service: Urology;  Laterality: Right;   IR IMAGING GUIDED PORT INSERTION  09/04/2020   IR NEPHROSTOMY PLACEMENT RIGHT  06/22/2019   IR PATIENT EVAL TECH 0-60 MINS  06/28/2019   IR  RADIOLOGIST EVAL & MGMT  06/06/2020   IR RADIOLOGIST EVAL & MGMT  07/17/2020   IR RADIOLOGIST EVAL & MGMT  08/15/2020   IR RADIOLOGIST EVAL & MGMT  09/03/2020   IR RADIOLOGIST EVAL & MGMT  12/18/2020   IR RADIOLOGIST EVAL & MGMT  02/25/2021   IR RADIOLOGIST EVAL & MGMT  06/23/2021   left forearm surgery due to calcium rock      RADIOLOGY WITH ANESTHESIA N/A 06/19/2020   Procedure: CT WITH ANESTHESIA MICROWAVE ABLATION;  Surgeon: Jacqulynn Cadet, MD;  Location: [facility];  Service: Anesthesiology;  Laterality: N/A;   RADIOLOGY WITH ANESTHESIA N/A 07/30/2021   Procedure: CT MICROWAVE ABLATION OF THE LIVER;  Surgeon: Criselda Peaches, MD;  Location: [facility];  Service: Radiology;  Laterality: N/A;   ROBOT ASSISTED PYELOPLASTY Right 08/03/2019   Procedure: XI ROBOTIC ASSISTED PYELOPLASTY WITH STENT PLACEMENT;  Surgeon: Cleon Gustin, MD;  Location: [facility];  Service: Urology;  Laterality: Right;  3 HRS   SHOULDER ARTHROSCOPY W/ ROTATOR CUFF REPAIR Right    XI ROBOTIC ASSISTED LOWER ANTERIOR RESECTION N/A 09/16/2018   Procedure: XI ROBOTIC ASSISTED LOWER ANTERIOR RESECTION ERAS PATHWAY;  Surgeon: Leighton Ruff, MD;  Location: [facility];  Service: General;  Laterality: N/A;   Social History:  reports that she quit smoking about 2 years ago. Her smoking use included cigarettes. She has a 30.00 pack-year smoking history. She has never used smokeless tobacco. She reports current alcohol use. She reports that she does not use drugs.  Allergies  Allergen Reactions   Prednisone Other (See Comments)    "Felt like she was crazy" if on it more than 1 week   Codeine Other (See Comments)    Causes migraine--patient requests this be kept on allergy list   Family History  Problem Relation Age of Onset   COPD Father        smoker   Stomach cancer Father        spread to lungs and bones   Arthritis Mother    Heart attack Paternal Grandmother    Heart attack Paternal Grandfather    Heart attack Maternal  Grandmother    Stroke Maternal Grandmother    Colon cancer Neg Hx    Rectal cancer Neg Hx    Esophageal cancer Neg Hx     Prior to Admission medications   Medication Sig Start Date End Date Taking? Authorizing Provider  alprazolam Duanne Moron) 2 MG tablet Take 2 mg by mouth every morning.    Yes [provider]  Ascorbic Acid (VITAMIN C) 1000 MG tablet Take 1,000 mg by mouth daily.   Yes [provider]  aspirin EC 81 MG tablet Take 81 mg by mouth every morning. Swallow whole.    Yes [provider]  Biotin 10 MG CAPS Take 10 mg by mouth daily.   Yes [provider]  buPROPion (WELLBUTRIN XL) 300 MG 24 hr tablet Take 300 mg by mouth  every morning.    Yes [provider]  calcium carbonate (OSCAL) 1500 (600 Ca) MG TABS tablet Take 600 mg of elemental calcium by mouth daily.   Yes [provider]  Cholecalciferol (DIALYVITE VITAMIN D 5000) 125 MCG (5000 UT) capsule Take 5,000 Units by mouth daily.   Yes [provider]  cyclobenzaprine (FLEXERIL) 10 MG tablet Take 20 mg by mouth at bedtime.   Yes [provider]  ECHINACEA-ZINC-VITAMIN C PO Take 1 tablet by mouth daily.   Yes [provider]  HYDROcodone-acetaminophen (NORCO) 10-325 MG tablet Take 1-2 tablets by mouth every 6 (six) hours as needed for moderate pain or severe pain (headache). Post-Operatively. 08/05/19  Yes Alexis Frock, MD  lidocaine-prilocaine (EMLA) cream Apply 1 application topically as directed. Apply 1-2 hours prior to IV stick and cover with plastic wrap 11/28/20  Yes Ladell Pier, MD  METAMUCIL FIBER PO Take 1 capsule by mouth daily.   Yes [provider]  Methylcellulose, Laxative, (CITRUCEL PO) Take 1 tablet by mouth daily.   Yes [provider]  Multiple Vitamin (MULTIVITAMIN WITH MINERALS) TABS tablet Take 1 tablet by mouth daily.   Yes [provider]  Multiple Vitamins-Minerals (IMMUNE SUPPORT PO) Take 1  tablet by mouth daily.   Yes [provider]  Omega-3 Fatty Acids (FISH OIL) 1000 MG CAPS Take 1,000 mg by mouth daily.   Yes [provider]  Prenatal Vit-Fe Fumarate-FA (PRENATAL PO) Take 1 tablet by mouth daily.   Yes [provider]  Probiotic Product (PROBIOTIC PO) Take 1 tablet by mouth every morning. Acidophilus 100 million   Yes [provider]  rOPINIRole (REQUIP) 1 MG tablet Take 1 mg by mouth at bedtime.   Yes [provider]  TURMERIC PO Take 1 tablet by mouth daily.   Yes [provider]  vitamin B-12 (CYANOCOBALAMIN) 1000 MCG tablet Take 1,000 mcg by mouth daily.   Yes [provider]  diphenoxylate-atropine (LOMOTIL) 2.5-0.025 MG tablet TAKE 1 TO 2 TABLETS BY MOUTH FOUR TIMES DAILY AS NEEDED FOR DIARRHEA OR LOOSE STOOLS 03/10/21   Owens Shark, NP  lubiprostone (AMITIZA) 24 MCG capsule Take 1 capsule (24 mcg total) by mouth 2 (two) times daily with a meal. 07/16/21 01/07/23  Cirigliano, Dominic Pea, DO   Physical Exam: Blood pressure 110/70, pulse 82, temperature 97.6 F (36.4 C), temperature source Oral, resp. rate 18, height 5' 8.5" (1.74 m), weight 98 kg, SpO2 99 %. Vitals:   07/31/21 0234 07/31/21 0537  BP: 139/76 110/70  Pulse: 90 82  Resp: 18 18  Temp: 97.8 F (36.6 C) 97.6 F (36.4 C)  SpO2: 98% 99%    General: 60 y.o. female resting in bed in NAD Eyes: PERRL, normal sclera ENMT: Nares patent w/o discharge, orophaynx clear, dentition normal, ears w/o discharge/lesions/ulcers Neck: Supple, trachea midline Cardiovascular: RRR, +S1, S2, no m/g/r, equal pulses throughout Respiratory: CTABL, no w/r/r, normal WOB GI: BS+, NDNT, no masses noted, no organomegaly noted MSK: No e/c/c Skin: No rashes, bruises, ulcerations noted Neuro: A&O x 3, no focal deficits Psyc: Appropriate interaction and affect, calm/cooperative  Labs on Admission:  Basic Metabolic Panel: Recent Labs  Lab 07/30/21 0857 07/31/21 0528   NA  --  135  K  --  5.1  CL  --  103  CO2  --  23  GLUCOSE  --  146*  BUN  --  42*  CREATININE 3.00* 2.75*  CALCIUM  --  8.7*  Liver Function Tests: No results for input(s): AST, ALT, ALKPHOS, BILITOT, PROT, ALBUMIN in the last 168 hours. No results for input(s): LIPASE, AMYLASE in the last 168 hours. No results for input(s): AMMONIA in the last 168 hours. CBC: Recent Labs  Lab 07/31/21 0527  WBC 12.5*  NEUTROABS 10.9*  HGB 12.4  HCT 38.6  MCV 97.0  PLT 228   Cardiac Enzymes: No results for input(s): CKTOTAL, CKMB, CKMBINDEX, TROPONINI in the last 168 hours. BNP: Invalid input(s): POCBNP CBG: No results for input(s): GLUCAP in the last 168 hours.  Radiological Exams on Admission: US RENAL  Result Date: 07/31/2021 CLINICAL DATA:  Right hydronephrosis EXAM: RENAL / URINARY TRACT ULTRASOUND COMPLETE COMPARISON:  None. FINDINGS: Right Kidney: Renal measurements: 9.8 x 5.6 x 6.0 cm = volume: 172 mL. Echogenicity within normal limits. No mass or hydronephrosis visualized. Left Kidney: Renal measurements: 9.9 x 5.0 x 4.7 cm = volume: 121 mL. Echogenicity within normal limits. No mass or hydronephrosis visualized. Bladder: Appears normal for degree of bladder distention. Other: Small amount of free fluid seen in the left flank. IMPRESSION: No hydronephrosis. Small amount of free fluid seen in the left flank. Electronically Signed   By: Yetta Glassman M.D.   On: 07/31/2021 10:01   CT GUIDE TISSUE ABLATION  Result Date: 07/30/2021 INDICATION: 61 year old female with a history of rectal adenocarcinoma with hepatic metastatic disease. She has imaging and biochemical evidence of marginal recurrence at the previously treated ablation site in hepatic segment 6. She presents today for biopsy and repeat percutaneous thermal ablation. EXAM: 1. Ultrasound-guided biopsy hepatic lesion 2. CT and ultrasound-guided percutaneous microwave ablation COMPARISON:  CT abdomen/pelvis 06/17/2021  MEDICATIONS: None. ANESTHESIA/SEDATION: General - as administered by the Anesthesia department FLUOROSCOPY TIME:  None. COMPLICATIONS: None immediate. TECHNIQUE: Informed written consent was obtained from the patient after a thorough discussion of the procedural risks, benefits and alternatives. All questions were addressed. Maximal Sterile Barrier Technique was utilized including caps, mask, sterile gowns, sterile gloves, sterile drape, hand hygiene and skin antiseptic. A timeout was performed prior to the initiation of the procedure. The patient was placed on the CT gantry. Initial nonenhanced CT imaging was performed. The prior ablation defect is visible. Unfortunately, the liver lesion is essentially occult by noncontrast enhanced CT imaging. Therefore, additional CT imaging was performed with intravenous contrast. This confirms the presence of the mass which has enlarged compared to the relatively recent prior imaging. The mass now measures approximately 3 x 3 x 3 cm. The right upper quadrant was also interrogated with ultrasound. The mass is readily visible by ultrasound. First, biopsy was performed. A suitable skin entry site was selected and marked. A small dermatotomy was made. Under real-time ultrasound guidance, an 18 gauge trocar needle was advanced into the margin the mass. Multiple 18 gauge core biopsies were then obtained coaxially using a bio Pince automated biopsy device. Biopsy specimens were placed in formalin and delivered to pathology for further analysis. Next, 2 NeuWave PR XT probes were selected. The probes were then advanced and positioned within the cephalad and more caudal aspects of the lesion in parallel separated by approximately 1.5 cm. Following adequate placement of the probes, thermal ablation was then performed. Both probes were powered at 65 watts in the ablation was continued for approximately 6 minutes. The ablation zone was monitored both by ultrasound and also CT imaging at  approximately 5 minutes. The ablation zone appears to encompass the entirety of the tumor. Following successful ablation, the probes were  removed. Repeat CT imaging was then performed with and without intravenous contrast. Adequate ablation of the region of tumor. No evidence of immediate complication. FINDINGS: Successful percutaneous tumor ablation without evidence of complication. IMPRESSION: 1. Ultrasound-guided core biopsy of presumed recurrent liver metastasis. 2. Successful percutaneous thermal ablation. Electronically Signed   By: Jacqulynn Cadet M.D.   On: 07/30/2021 12:10   CT BIOPSY  Result Date: 07/30/2021 INDICATION: 60 year old female with a history of rectal adenocarcinoma with hepatic metastatic disease. She has imaging and biochemical evidence of marginal recurrence at the previously treated ablation site in hepatic segment 6. She presents today for biopsy and repeat percutaneous thermal ablation. EXAM: 1. Ultrasound-guided biopsy hepatic lesion 2. CT and ultrasound-guided percutaneous microwave ablation COMPARISON:  CT abdomen/pelvis 06/17/2021 MEDICATIONS: None. ANESTHESIA/SEDATION: General - as administered by the Anesthesia department FLUOROSCOPY TIME:  None. COMPLICATIONS: None immediate. TECHNIQUE: Informed written consent was obtained from the patient after a thorough discussion of the procedural risks, benefits and alternatives. All questions were addressed. Maximal Sterile Barrier Technique was utilized including caps, mask, sterile gowns, sterile gloves, sterile drape, hand hygiene and skin antiseptic. A timeout was performed prior to the initiation of the procedure. The patient was placed on the CT gantry. Initial nonenhanced CT imaging was performed. The prior ablation defect is visible. Unfortunately, the liver lesion is essentially occult by noncontrast enhanced CT imaging. Therefore, additional CT imaging was performed with intravenous contrast. This confirms the presence of  the mass which has enlarged compared to the relatively recent prior imaging. The mass now measures approximately 3 x 3 x 3 cm. The right upper quadrant was also interrogated with ultrasound. The mass is readily visible by ultrasound. First, biopsy was performed. A suitable skin entry site was selected and marked. A small dermatotomy was made. Under real-time ultrasound guidance, an 18 gauge trocar needle was advanced into the margin the mass. Multiple 18 gauge core biopsies were then obtained coaxially using a bio Pince automated biopsy device. Biopsy specimens were placed in formalin and delivered to pathology for further analysis. Next, 2 NeuWave PR XT probes were selected. The probes were then advanced and positioned within the cephalad and more caudal aspects of the lesion in parallel separated by approximately 1.5 cm. Following adequate placement of the probes, thermal ablation was then performed. Both probes were powered at 65 watts in the ablation was continued for approximately 6 minutes. The ablation zone was monitored both by ultrasound and also CT imaging at approximately 5 minutes. The ablation zone appears to encompass the entirety of the tumor. Following successful ablation, the probes were removed. Repeat CT imaging was then performed with and without intravenous contrast. Adequate ablation of the region of tumor. No evidence of immediate complication. FINDINGS: Successful percutaneous tumor ablation without evidence of complication. IMPRESSION: 1. Ultrasound-guided core biopsy of presumed recurrent liver metastasis. 2. Successful percutaneous thermal ablation. Electronically Signed   By: Jacqulynn Cadet M.D.   On: 07/30/2021 12:10    EKG: None obtained prior to procedure  Time spent: [facility]  If 7PM-7AM, please contact night-coverage [email or web address] 07/31/2021, 10:46 AM

## 2021-07-31 NOTE — Progress Notes (Signed)
Pt. Unable to void since foley removed at 4pm today. She has been bladder scanned twice with volumes of 120 and 109.  She is also complaining of severe pain and cramping that started after foley was removed by a previous RN. Pt. Reports foley removal was very painful earlier today. On call provider Mugweru was notified. He was also told about patient's abdominal pain and discomfort after foley removal. He said give patient option to void or in and out cath patient.Roderick Pee

## 2021-07-31 NOTE — Telephone Encounter (Signed)
Notified patient of Dr.Cirigliano's recommendations. PV done. Patient will call us back if she needs to reschedule colonoscopy.

## 2021-07-31 NOTE — Anesthesia Postprocedure Evaluation (Signed)
Anesthesia Post Note  Patient: Payson Crumby  Procedure(s) Performed: CYSTOSCOPY WITH RETROGRADE PYELOGRAM, URETEROSCOPY AND STENT PLACEMENT (Right: Ureter)     Patient location during evaluation: PACU Anesthesia Type: General Level of consciousness: patient cooperative, oriented and sedated Pain management: pain level controlled Vital Signs Assessment: post-procedure vital signs reviewed and stable Respiratory status: spontaneous breathing, nonlabored ventilation and respiratory function stable Cardiovascular status: blood pressure returned to baseline and stable Postop Assessment: no apparent nausea or vomiting and adequate PO intake Anesthetic complications: no   No notable events documented.  Last Vitals:  Vitals:   07/31/21 0537 07/31/21 1354  BP: 110/70 (!) 139/98  Pulse: 82 87  Resp: 18 17  Temp: 36.4 C 36.7 C  SpO2: 99% 97%    Last Pain:  Vitals:   07/31/21 1810  TempSrc:   PainSc: 8                  Amarilis Belflower,E. Lathyn Griggs

## 2021-07-31 NOTE — Anesthesia Preprocedure Evaluation (Addendum)
Anesthesia Evaluation  Patient identified by MRN, date of birth, ID band Patient awake    Reviewed: Allergy & Precautions, NPO status , Patient's Chart, lab work & pertinent test results  History of Anesthesia Complications Negative for: history of anesthetic complications  Airway Mallampati: II  TM Distance: >3 FB Neck ROM: Full    Dental  (+) Poor Dentition, Missing, Chipped, Dental Advisory Given   Pulmonary former smoker,  07/28/2021 SARS coronavirus NEG   breath sounds clear to auscultation       Cardiovascular negative cardio ROS   Rhythm:Regular Rate:Normal     Neuro/Psych  Headaches, Anxiety Depression    GI/Hepatic Mets to liver adenoca of rectum   Endo/Other  obese  Renal/GU Renal InsufficiencyRenal disease (hydronephrosis)     Musculoskeletal  (+) Arthritis ,   Abdominal (+) + obese,   Peds  Hematology negative hematology ROS (+)   Anesthesia Other Findings   Reproductive/Obstetrics                            Anesthesia Physical Anesthesia Plan  ASA: 3  Anesthesia Plan: General   Post-op Pain Management:    Induction: Intravenous  PONV Risk Score and Plan: 3  Airway Management Planned: LMA  Additional Equipment: None  Intra-op Plan:   Post-operative Plan:   Informed Consent: I have reviewed the patients History and Physical, chart, labs and discussed the procedure including the risks, benefits and alternatives for the proposed anesthesia with the patient or authorized representative who has indicated his/her understanding and acceptance.     Dental advisory given  Plan Discussed with: CRNA and Surgeon  Anesthesia Plan Comments:        Anesthesia Quick Evaluation

## 2021-07-31 NOTE — Telephone Encounter (Signed)
Can monitor her inpatient course. If she has a quick recovery and discharge, ok to proceed with colonoscopy on 11/15 as schedule. If delayed recovery/prolonged hospitalization, we can delay the colonoscopy until later in November. Per notes, inpatient Hospitalist team is actively addressing post procedure pain and can provide Rx for constipation as well, and can consult inpatient GI if needed. Thank you for the update.

## 2021-07-31 NOTE — Transfer of Care (Signed)
Immediate Anesthesia Transfer of Care Note  Patient: Norma Colon  Procedure(s) Performed: CYSTOSCOPY WITH RETROGRADE PYELOGRAM, URETEROSCOPY AND STENT PLACEMENT (Right: Ureter)  Patient Location: PACU  Anesthesia Type:General  Level of Consciousness: awake, alert  and oriented  Airway & Oxygen Therapy: Patient Spontanous Breathing  Post-op Assessment: Report given to RN, Post -op Vital signs reviewed and stable and Patient moving all extremities  Post vital signs: Reviewed and stable  Last Vitals:  Vitals Value Taken Time  BP 124/60 07/31/21 2314  Temp    Pulse 97 07/31/21 2315  Resp 18 07/31/21 2315  SpO2 91 % 07/31/21 2315  Vitals shown include unvalidated device data.  Last Pain:  Vitals:   07/31/21 1810  TempSrc:   PainSc: 8       Patients Stated Pain Goal: 0 (32/20/25 4270)  Complications: No notable events documented.

## 2021-07-31 NOTE — Consult Note (Signed)
Reason for Consult:AKI Referring Physician: Laurence Ferrari, MD  Norma Colon is an 60 y.o. female with a PMH significant for rectal cancer with liver mets s/p chemo, radiation, and surgery, nephrolithiasis, right ureteral stricture and hydronephrosis s/p ureteral neocystotomy and psoas hitch and LAH, migraines, and anxiety presented with elevated CEA levels and liver lesions.  She was admitted for microwave ablation and biopsy 07/30/21 but pre-op labs were notable for Scr of 3.  She underwent the procedure without complications, however her Scr remained elevated at 2.75 this morning and we were consulted to further evaluate and manage her AKI.  She did not receive any IV contrast materials since 06/19/21.  Foley catheter was placed prior to procedure and removed yesterday associated with severe suprapubic pain and gross hematuria.  Renal US without hydronephrosis and bladder scans have revealed scant amount of urine.  The trend in Scr is seen below.  She denies any NSAIDs or COX-II inhibitor use prior to admission.  She does report severe constipation and took lubiprostone with marked diarrhea prior to admission.  She denies any N/V and drinks water regularly.  She denied any use of FLEETS enema. She denies any rashes, fevers, edema, or issues with urination prior to foley catheter removal.  She now has symptoms of urgency and retention.   She has chronic right flank pain at site of her previous ureteral surgery.    Trend in Creatinine: Creatinine  Date/Time Value Ref Range Status  07/31/2021 05:28 AM 2.75 (H) 0.44 - 1.00 mg/dL Final  07/30/2021 08:57 AM 3.00 (H) 0.44 - 1.00 mg/dL Final  07/15/2021 01:42 PM 0.84 0.44 - 1.00 mg/dL Final  06/17/2021 12:38 PM 0.90 0.44 - 1.00 mg/dL Final  04/15/2021 02:05 PM 1.03 (H) 0.44 - 1.00 mg/dL Final  11/28/2020 08:12 AM 1.15 (H) 0.44 - 1.00 mg/dL Final  11/14/2020 09:08 AM 1.08 (H) 0.44 - 1.00 mg/dL Final  10/30/2020 10:54 AM 1.34 (H) 0.44 - 1.00 mg/dL Final   10/14/2020 01:14 PM 1.07 (H) 0.44 - 1.00 mg/dL Final  10/08/2020 12:13 PM 1.04 (H) 0.44 - 1.00 mg/dL Final  09/23/2020 08:12 AM 1.00 0.44 - 1.00 mg/dL Final  09/04/2020 12:11 PM 1.20 (H) 0.44 - 1.00 mg/dL Final  07/11/2020 10:52 AM 1.14 (H) 0.44 - 1.00 mg/dL Final  04/11/2020 11:02 AM 1.21 (H) 0.44 - 1.00 mg/dL Final  10/27/2019 11:48 AM 1.05 (H) 0.44 - 1.00 mg/dL Final  06/05/2019 11:48 AM 1.16 (H) 0.44 - 1.00 mg/dL Final  05/29/2019 12:47 PM 1.24 (H) 0.44 - 1.00 mg/dL Final  03/30/2019 09:49 AM 1.16 (H) 0.44 - 1.00 mg/dL Final  12/19/2018 09:45 AM 1.03 (H) 0.44 - 1.00 mg/dL Final  11/28/2018 09:37 AM 1.03 (H) 0.44 - 1.00 mg/dL Final  11/07/2018 11:22 AM 1.00 0.44 - 1.00 mg/dL Final  10/17/2018 11:03 AM 1.17 (H) 0.44 - 1.00 mg/dL Final  08/03/2018 11:49 AM 0.97 0.44 - 1.00 mg/dL Final  07/18/2018 12:51 PM 0.81 0.44 - 1.00 mg/dL Final  07/06/2018 09:53 AM 0.89 0.44 - 1.00 mg/dL Final  06/22/2018 03:30 PM 1.06 (H) 0.44 - 1.00 mg/dL Final  05/31/2018 11:39 AM 0.89 0.44 - 1.00 mg/dL Final   Creatinine, Ser  Date/Time Value Ref Range Status  06/17/2020 03:13 PM 0.88 0.44 - 1.00 mg/dL Final  08/04/2019 04:44 AM 1.22 (H) 0.44 - 1.00 mg/dL Final  07/27/2019 02:04 PM 0.93 0.44 - 1.00 mg/dL Final  06/22/2019 08:21 AM 1.17 (H) 0.44 - 1.00 mg/dL Final  11/24/2018 08:42 PM 0.95 0.44 - 1.00  mg/dL Final  11/23/2018 04:12 AM 0.95 0.44 - 1.00 mg/dL Final  11/22/2018 08:21 AM 0.88 0.44 - 1.00 mg/dL Final  11/21/2018 05:59 AM 0.96 0.44 - 1.00 mg/dL Final  11/21/2018 12:07 AM 1.11 (H) 0.44 - 1.00 mg/dL Final  11/19/2018 08:46 PM 1.07 (H) 0.44 - 1.00 mg/dL Final  09/19/2018 03:44 AM 1.20 (H) 0.44 - 1.00 mg/dL Final  09/18/2018 03:47 AM 1.26 (H) 0.44 - 1.00 mg/dL Final  09/17/2018 03:06 AM 1.37 (H) 0.44 - 1.00 mg/dL Final  09/09/2018 01:40 PM 1.22 (H) 0.44 - 1.00 mg/dL Final    PMH:   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[facility] oncologist-- dr Benay Spice--  per last  note in epic ,  clinical remission   dx 08/ 2019---- chemoradiation concurrent completed 07-20-2018;  s/p  low anterior resection 09-16-2018;   chemo 10-17-2018  ot 12-19-2018   Anxiety    Arthritis    Chemotherapy induced neutropenia ([place])    Depression    Headache    migraines    History of HPV infection    History of kidney stones    Hydronephrosis, right    Restless leg syndrome    Scoliosis    Sepsis ([place])    11-2018    PSH:   Past Surgical History:  Procedure Laterality Date   COLPOSCOPY  04/04/2018   CYSTOSCOPY WITH RETROGRADE PYELOGRAM, URETEROSCOPY AND STENT PLACEMENT Right 12/12/2018   Procedure: CYSTOSCOPY WITH RETROGRADE PYELOGRAM, URETEROSCOPY, STONE EXTRACTION AND STENT PLACEMENT;  Surgeon: Cleon Gustin, MD;  Location: [facility];  Service: Urology;  Laterality: Right;   CYSTOSCOPY WITH RETROGRADE PYELOGRAM, URETEROSCOPY AND STENT PLACEMENT Right 04/17/2019   Procedure: CYSTOSCOPY WITH RETROGRADE PYELOGRAM, BALLOON [place]  URETEROSCOPY AND STENT PLACEMENT;  Surgeon: Cleon Gustin, MD;  Location: [facility];  Service: Urology;  Laterality: Right;   CYSTOSCOPY WITH STENT PLACEMENT Right 11/21/2018   Procedure: CYSTOSCOPY, RIGHT RETROGRADE PYELOGRAM, WITH RIGHT URETERAL STENT PLACEMENT;  Surgeon: Cleon Gustin, MD;  Location: [facility];  Service: Urology;  Laterality: Right;   IR IMAGING GUIDED PORT INSERTION  09/04/2020   IR NEPHROSTOMY PLACEMENT RIGHT  06/22/2019   IR PATIENT EVAL TECH 0-60 MINS  06/28/2019   IR RADIOLOGIST EVAL & MGMT  06/06/2020   IR RADIOLOGIST EVAL & MGMT  07/17/2020   IR RADIOLOGIST EVAL & MGMT  08/15/2020   IR RADIOLOGIST EVAL & MGMT  09/03/2020   IR RADIOLOGIST EVAL & MGMT  12/18/2020   IR RADIOLOGIST EVAL & MGMT  02/25/2021   IR RADIOLOGIST EVAL & MGMT  06/23/2021   left forearm surgery due to calcium rock      RADIOLOGY WITH ANESTHESIA N/A 06/19/2020   Procedure: CT WITH ANESTHESIA MICROWAVE  ABLATION;  Surgeon: Jacqulynn Cadet, MD;  Location: [facility];  Service: Anesthesiology;  Laterality: N/A;   RADIOLOGY WITH ANESTHESIA N/A 07/30/2021   Procedure: CT MICROWAVE ABLATION OF THE LIVER;  Surgeon: Criselda Peaches, MD;  Location: [facility];  Service: Radiology;  Laterality: N/A;   ROBOT ASSISTED PYELOPLASTY Right 08/03/2019   Procedure: XI ROBOTIC ASSISTED PYELOPLASTY WITH STENT PLACEMENT;  Surgeon: Cleon Gustin, MD;  Location: [facility];  Service: Urology;  Laterality: Right;  3 HRS   SHOULDER ARTHROSCOPY W/ ROTATOR CUFF REPAIR Right    XI ROBOTIC ASSISTED LOWER ANTERIOR RESECTION N/A 09/16/2018   Procedure: XI ROBOTIC ASSISTED LOWER ANTERIOR RESECTION ERAS PATHWAY;  Surgeon: Leighton Ruff, MD;  Location: [facility];  Service: General;  Laterality: N/A;    Allergies:  Allergies  Allergen Reactions   Prednisone Other (See Comments)    "Felt like she was crazy" if on it more than 1 week   Codeine Other (See Comments)    Causes migraine--patient requests this be kept on allergy list    Medications:   Prior to Admission medications   Medication Sig Start Date End Date Taking? Authorizing Provider  alprazolam Duanne Moron) 2 MG tablet Take 2 mg by mouth every morning.    Yes [provider]  Ascorbic Acid (VITAMIN C) 1000 MG tablet Take 1,000 mg by mouth daily.   Yes [provider]  aspirin EC 81 MG tablet Take 81 mg by mouth every morning. Swallow whole.    Yes [provider]  Biotin 10 MG CAPS Take 10 mg by mouth daily.   Yes [provider]  buPROPion (WELLBUTRIN XL) 300 MG 24 hr tablet Take 300 mg by mouth every morning.    Yes [provider]  calcium carbonate (OSCAL) 1500 (600 Ca) MG TABS tablet Take 600 mg of elemental calcium by mouth daily.   Yes [provider]  Cholecalciferol (DIALYVITE VITAMIN D 5000) 125 MCG (5000 UT) capsule Take 5,000 Units by mouth daily.   Yes [provider]  cyclobenzaprine (FLEXERIL)  10 MG tablet Take 20 mg by mouth at bedtime.   Yes [provider]  ECHINACEA-ZINC-VITAMIN C PO Take 1 tablet by mouth daily.   Yes [provider]  HYDROcodone-acetaminophen (NORCO) 10-325 MG tablet Take 1-2 tablets by mouth every 6 (six) hours as needed for moderate pain or severe pain (headache). Post-Operatively. 08/05/19  Yes Alexis Frock, MD  lidocaine-prilocaine (EMLA) cream Apply 1 application topically as directed. Apply 1-2 hours prior to IV stick and cover with plastic wrap 11/28/20  Yes Ladell Pier, MD  METAMUCIL FIBER PO Take 1 capsule by mouth daily.   Yes [provider]  Methylcellulose, Laxative, (CITRUCEL PO) Take 1 tablet by mouth daily.   Yes [provider]  Multiple Vitamin (MULTIVITAMIN WITH MINERALS) TABS tablet Take 1 tablet by mouth daily.   Yes [provider]  Multiple Vitamins-Minerals (IMMUNE SUPPORT PO) Take 1 tablet by mouth daily.   Yes [provider]  Omega-3 Fatty Acids (FISH OIL) 1000 MG CAPS Take 1,000 mg by mouth daily.   Yes [provider]  Prenatal Vit-Fe Fumarate-FA (PRENATAL PO) Take 1 tablet by mouth daily.   Yes [provider]  Probiotic Product (PROBIOTIC PO) Take 1 tablet by mouth every morning. Acidophilus 100 million   Yes [provider]  rOPINIRole (REQUIP) 1 MG tablet Take 1 mg by mouth at bedtime.   Yes [provider]  TURMERIC PO Take 1 tablet by mouth daily.   Yes [provider]  vitamin B-12 (CYANOCOBALAMIN) 1000 MCG tablet Take 1,000 mcg by mouth daily.   Yes [provider]  diphenoxylate-atropine (LOMOTIL) 2.5-0.025 MG tablet TAKE 1 TO 2 TABLETS BY MOUTH FOUR TIMES DAILY AS NEEDED FOR DIARRHEA OR LOOSE STOOLS 03/10/21   Owens Shark, NP  lubiprostone (AMITIZA) 24 MCG capsule Take 1 capsule (24 mcg total) by mouth 2 (two) times daily with a meal. 07/16/21 01/07/23  Cirigliano, Dominic Pea, DO    Inpatient medications:  alprazolam   2 mg Oral q morning   aspirin EC  81 mg Oral q morning   buPROPion  300 mg Oral q morning   cyclobenzaprine  20  mg Oral QHS   docusate sodium  100 mg Oral BID   lubiprostone  24 mcg Oral BID WC   pantoprazole  40 mg Oral Daily   psyllium  1 packet Oral Daily   rOPINIRole  1 mg Oral QHS    Discontinued Meds:   Medications Discontinued During This Encounter  Medication Reason   Ascorbic Acid (VITAMIN C PO) Dose change   BIOTIN PO Dose change   Cholecalciferol (VITAMIN D-3 PO) Dose change   Magnesium 500 MG CAPS Patient Preference   ondansetron (ZOFRAN) 8 MG tablet Patient Preference   prochlorperazine (COMPAZINE) 10 MG tablet Patient Preference   rOPINIRole (REQUIP) 0.25 MG tablet Dose change   magic mouthwash SOLN Completed Course   Cyanocobalamin (V-77 PO) Duplicate   lactated ringers infusion    lactated ringers infusion    meperidine (DEMEROL) injection 6.25-12.5 mg Patient Transfer   midazolam (VERSED) injection 0.5-2 mg Patient Transfer   promethazine (PHENERGAN) injection 6.25-12.5 mg Patient Transfer   fentaNYL (SUBLIMAZE) injection 25-50 mcg Patient Transfer   labetalol (NORMODYNE) injection 5 mg Patient Transfer   Methylcellulose (Laxative) TABS Duplicate    Social History:  reports that she quit smoking about 2 years ago. Her smoking use included cigarettes. She has a 30.00 pack-year smoking history. She has never used smokeless tobacco. She reports current alcohol use. She reports that she does not use drugs.  Family History:   Family History  Problem Relation Age of Onset   COPD Father        smoker   Stomach cancer Father        spread to lungs and bones   Arthritis Mother    Heart attack Paternal Grandmother    Heart attack Paternal Grandfather    Heart attack Maternal Grandmother    Stroke Maternal Grandmother    Colon cancer Neg Hx    Rectal cancer Neg Hx    Esophageal cancer Neg Hx     Pertinent items are noted in HPI. Weight change:    Intake/Output Summary (Last 24 hours) at 07/31/2021 1131 Last data filed at 07/31/2021 0900 Gross per 24 hour  Intake 2116.23 ml  Output 325 ml  Net 1791.23 ml   BP 110/70   Pulse 82   Temp 97.6 F (36.4 C) (Oral)   Resp 18   Ht 5' 8.5" (1.74 m)   Wt 98 kg   LMP  (LMP Unknown)   SpO2 99%   BMI 32.37 kg/m  Vitals:   07/30/21 1651 07/30/21 2124 07/31/21 0234 07/31/21 0537  BP: 138/77 106/74 139/76 110/70  Pulse: 76 71 90 82  Resp: 16 18 18 18   Temp: 98.1 F (36.7 C) 97.9 F (36.6 C) 97.8 F (36.6 C) 97.6 F (36.4 C)  TempSrc: Oral Oral Oral Oral  SpO2: 97% 95% 98% 99%  Weight:      Height:         General appearance: alert, cooperative, and no distress Head: Normocephalic, without obvious abnormality, atraumatic Resp: clear to auscultation bilaterally Cardio: regular rate and rhythm, S1, S2 normal, no murmur, click, rub or gallop GI: soft, non-tender; bowel sounds normal; no masses,  no organomegaly Extremities: extremities normal, atraumatic, no cyanosis or edema  Labs: Basic Metabolic Panel: Recent Labs  Lab 07/30/21 0857 07/31/21 0528  NA  --  135  K  --  5.1  CL  --  103  CO2  --  23  GLUCOSE  --  146*  BUN  --  42*  CREATININE 3.00* 2.75*  CALCIUM  --  8.7*   Liver Function Tests: No results for input(s): AST, ALT, ALKPHOS, BILITOT, PROT, ALBUMIN in the last 168 hours. No results for input(s): LIPASE, AMYLASE in the last 168 hours. No results for input(s): AMMONIA in the last 168 hours. CBC: Recent Labs  Lab 29-Aug-2021 0527  WBC 12.5*  NEUTROABS 10.9*  HGB 12.4  HCT 38.6  MCV 97.0  PLT 228   PT/INR: @LABRCNTIP (inr:5) Cardiac Enzymes: )No results for input(s): CKTOTAL, CKMB, CKMBINDEX, TROPONINI in the last 168 hours. CBG: No results for input(s): GLUCAP in the last 168 hours.  Iron Studies: No results for input(s): IRON, TIBC, TRANSFERRIN, FERRITIN in the last 168 hours.  Xrays/Other Studies: US RENAL  Result Date:  08/29/2021 CLINICAL DATA:  Right hydronephrosis EXAM: RENAL / URINARY TRACT ULTRASOUND COMPLETE COMPARISON:  None. FINDINGS: Right Kidney: Renal measurements: 9.8 x 5.6 x 6.0 cm = volume: 172 mL. Echogenicity within normal limits. No mass or hydronephrosis visualized. Left Kidney: Renal measurements: 9.9 x 5.0 x 4.7 cm = volume: 121 mL. Echogenicity within normal limits. No mass or hydronephrosis visualized. Bladder: Appears normal for degree of bladder distention. Other: Small amount of free fluid seen in the left flank. IMPRESSION: No hydronephrosis. Small amount of free fluid seen in the left flank. Electronically Signed   By: Yetta Glassman M.D.   On: 08-29-21 10:01   CT GUIDE TISSUE ABLATION  Result Date: 07/30/2021 INDICATION: 60 year old female with a history of rectal adenocarcinoma with hepatic metastatic disease. She has imaging and biochemical evidence of marginal recurrence at the previously treated ablation site in hepatic segment 6. She presents today for biopsy and repeat percutaneous thermal ablation. EXAM: 1. Ultrasound-guided biopsy hepatic lesion 2. CT and ultrasound-guided percutaneous microwave ablation COMPARISON:  CT abdomen/pelvis 06/17/2021 MEDICATIONS: None. ANESTHESIA/SEDATION: General - as administered by the Anesthesia department FLUOROSCOPY TIME:  None. COMPLICATIONS: None immediate. TECHNIQUE: Informed written consent was obtained from the patient after a thorough discussion of the procedural risks, benefits and alternatives. All questions were addressed. Maximal Sterile Barrier Technique was utilized including caps, mask, sterile gowns, sterile gloves, sterile drape, hand hygiene and skin antiseptic. A timeout was performed prior to the initiation of the procedure. The patient was placed on the CT gantry. Initial nonenhanced CT imaging was performed. The prior ablation defect is visible. Unfortunately, the liver lesion is essentially occult by noncontrast enhanced CT  imaging. Therefore, additional CT imaging was performed with intravenous contrast. This confirms the presence of the mass which has enlarged compared to the relatively recent prior imaging. The mass now measures approximately 3 x 3 x 3 cm. The right upper quadrant was also interrogated with ultrasound. The mass is readily visible by ultrasound. First, biopsy was performed. A suitable skin entry site was selected and marked. A small dermatotomy was made. Under real-time ultrasound guidance, an 18 gauge trocar needle was advanced into the margin the mass. Multiple 18 gauge core biopsies were then obtained coaxially using a bio Pince automated biopsy device. Biopsy specimens were placed in formalin and delivered to pathology for further analysis. Next, 2 NeuWave PR XT probes were selected. The probes were then advanced and positioned within the cephalad and more caudal aspects of the lesion in parallel separated by approximately 1.5 cm. Following adequate placement of the probes, thermal ablation was then performed. Both probes were powered at 65 watts in the ablation was continued for approximately 6 minutes. The ablation zone was monitored both by ultrasound  and also CT imaging at approximately 5 minutes. The ablation zone appears to encompass the entirety of the tumor. Following successful ablation, the probes were removed. Repeat CT imaging was then performed with and without intravenous contrast. Adequate ablation of the region of tumor. No evidence of immediate complication. FINDINGS: Successful percutaneous tumor ablation without evidence of complication. IMPRESSION: 1. Ultrasound-guided core biopsy of presumed recurrent liver metastasis. 2. Successful percutaneous thermal ablation. Electronically Signed   By: Jacqulynn Cadet M.D.   On: 07/30/2021 12:10   CT BIOPSY  Result Date: 07/30/2021 INDICATION: 60 year old female with a history of rectal adenocarcinoma with hepatic metastatic disease. She has  imaging and biochemical evidence of marginal recurrence at the previously treated ablation site in hepatic segment 6. She presents today for biopsy and repeat percutaneous thermal ablation. EXAM: 1. Ultrasound-guided biopsy hepatic lesion 2. CT and ultrasound-guided percutaneous microwave ablation COMPARISON:  CT abdomen/pelvis 06/17/2021 MEDICATIONS: None. ANESTHESIA/SEDATION: General - as administered by the Anesthesia department FLUOROSCOPY TIME:  None. COMPLICATIONS: None immediate. TECHNIQUE: Informed written consent was obtained from the patient after a thorough discussion of the procedural risks, benefits and alternatives. All questions were addressed. Maximal Sterile Barrier Technique was utilized including caps, mask, sterile gowns, sterile gloves, sterile drape, hand hygiene and skin antiseptic. A timeout was performed prior to the initiation of the procedure. The patient was placed on the CT gantry. Initial nonenhanced CT imaging was performed. The prior ablation defect is visible. Unfortunately, the liver lesion is essentially occult by noncontrast enhanced CT imaging. Therefore, additional CT imaging was performed with intravenous contrast. This confirms the presence of the mass which has enlarged compared to the relatively recent prior imaging. The mass now measures approximately 3 x 3 x 3 cm. The right upper quadrant was also interrogated with ultrasound. The mass is readily visible by ultrasound. First, biopsy was performed. A suitable skin entry site was selected and marked. A small dermatotomy was made. Under real-time ultrasound guidance, an 18 gauge trocar needle was advanced into the margin the mass. Multiple 18 gauge core biopsies were then obtained coaxially using a bio Pince automated biopsy device. Biopsy specimens were placed in formalin and delivered to pathology for further analysis. Next, 2 NeuWave PR XT probes were selected. The probes were then advanced and positioned within the  cephalad and more caudal aspects of the lesion in parallel separated by approximately 1.5 cm. Following adequate placement of the probes, thermal ablation was then performed. Both probes were powered at 65 watts in the ablation was continued for approximately 6 minutes. The ablation zone was monitored both by ultrasound and also CT imaging at approximately 5 minutes. The ablation zone appears to encompass the entirety of the tumor. Following successful ablation, the probes were removed. Repeat CT imaging was then performed with and without intravenous contrast. Adequate ablation of the region of tumor. No evidence of immediate complication. FINDINGS: Successful percutaneous tumor ablation without evidence of complication. IMPRESSION: 1. Ultrasound-guided core biopsy of presumed recurrent liver metastasis. 2. Successful percutaneous thermal ablation. Electronically Signed   By: Jacqulynn Cadet M.D.   On: 07/30/2021 12:10     Assessment/Plan:  AKI - unclear etiology and appears to have happened within the last 2 weeks given normal Scr of 0.84 on 07/15/21 and predated the ablation.  Unable to identify any nephrotoxic agents (although she takes a lot of supplements) and renal US without evidence of obstruction.  Will order urine studies but will likely have hematuria following traumatic foley removal.  Agree  with IVF's and CT of abdomen to further evaluate for possible stone or obstruction.  Continue to follow UOP and Scr for now.  No indication for dialysis at this time. Suprapubic pain and gross hematuria - due to traumatic foley removal.  Await CT scan findings.  Rectal cancer with mets to liver - s/p microwave ablation 07/30/21. Hyperkalemia - due to #1.  Ok to give lokelma 10 grams po x 1 and follow.   Governor Rooks Ildefonso Keaney 07/31/2021, 11:31 AM

## 2021-07-31 NOTE — Progress Notes (Signed)
Patient was able to void 75 cc, but still felt like she needed to be in and out catheterized (distended feeling). I&O cath was performed and only 50 cc's of clear yellow urine resulted. Paged MD on call regarding inadequate output for shift. Waiting to hear back.Roderick Pee

## 2021-07-31 NOTE — Progress Notes (Addendum)
Patient ID: Norma Colon, female   DOB: 09-30-1961, 60 y.o.   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  Renal ultrasound completed this morning showed no hydronephrosis but small amount of free fluid in the left flank region.   Allergies: Prednisone and Codeine  Medications: Prior to Admission medications   Medication Sig Start Date End Date Taking? Authorizing Provider  alprazolam Duanne Moron) 2 MG tablet Take 2 mg by mouth every morning.    Yes [provider]  Ascorbic Acid (VITAMIN C) 1000 MG tablet Take 1,000 mg by mouth daily.   Yes [provider]  aspirin EC 81 MG tablet Take 81 mg by mouth every morning. Swallow whole.    Yes [provider]  Biotin 10 MG CAPS Take 10 mg by mouth daily.   Yes [provider]  buPROPion (WELLBUTRIN XL) 300 MG 24 hr tablet Take 300 mg by mouth every morning.    Yes [provider]  calcium carbonate (OSCAL) 1500 (600 Ca) MG TABS tablet Take 600 mg of elemental calcium by mouth daily.   Yes [provider]  Cholecalciferol (DIALYVITE VITAMIN D 5000) 125 MCG (5000 UT) capsule Take 5,000 Units by mouth daily.   Yes [provider]  cyclobenzaprine (FLEXERIL) 10 MG tablet  Take 20 mg by mouth at bedtime.   Yes [provider]  ECHINACEA-ZINC-VITAMIN C PO Take 1 tablet by mouth daily.   Yes [provider]  HYDROcodone-acetaminophen (NORCO) 10-325 MG tablet Take 1-2 tablets by mouth every 6 (six) hours as needed for moderate pain or severe pain (headache). Post-Operatively. 08/05/19  Yes Alexis Frock, MD  lidocaine-prilocaine (EMLA) cream Apply 1 application topically as directed. Apply 1-2 hours prior to IV stick and cover with plastic wrap 11/28/20  Yes Ladell Pier, MD  METAMUCIL FIBER PO Take 1 capsule by mouth daily.   Yes [provider]  Methylcellulose, Laxative, (CITRUCEL PO) Take 1 tablet by mouth daily.   Yes [provider]  Multiple Vitamin (MULTIVITAMIN WITH MINERALS) TABS tablet Take 1 tablet by mouth daily.   Yes [provider]  Multiple Vitamins-Minerals (IMMUNE SUPPORT PO) Take 1 tablet by mouth daily.   Yes [provider]  Omega-3 Fatty Acids (FISH OIL) 1000 MG CAPS Take 1,000 mg by mouth daily.   Yes [provider]  Prenatal Vit-Fe Fumarate-FA (PRENATAL PO) Take 1 tablet by mouth daily.   Yes [provider]  Probiotic Product (PROBIOTIC PO) Take 1 tablet by mouth every morning. Acidophilus 100 million   Yes [provider]  rOPINIRole (REQUIP) 1 MG tablet Take 1 mg by mouth at bedtime.   Yes [provider]  TURMERIC PO Take 1 tablet by mouth daily.   Yes [provider]  vitamin B-12 (CYANOCOBALAMIN) 1000 MCG tablet Take 1,000  mcg by mouth daily.   Yes [provider]  diphenoxylate-atropine (LOMOTIL) 2.5-0.025 MG tablet TAKE 1 TO 2 TABLETS BY MOUTH FOUR TIMES DAILY AS NEEDED FOR DIARRHEA OR LOOSE STOOLS 03/10/21   Owens Shark, NP  lubiprostone (AMITIZA) 24 MCG capsule Take 1 capsule (24 mcg total) by mouth 2 (two) times daily with a meal. 07/16/21 01/07/23  Cirigliano, Vito V, DO     Vital Signs: BP 110/70   Pulse 82   Temp  97.6 F (36.4 C) (Oral)   Resp 18   Ht 5' 8.5" (1.74 m)   Wt 216 lb 0.8 oz (98 kg)   LMP  (LMP Unknown)   SpO2 99%   BMI 32.37 kg/m   Physical Exam awake, alert.  Chest clear to auscultation bilaterally.  Heart with regular rate and rhythm.  Abdomen soft, positive bowel sounds, puncture sites right upper quadrant abdomen clean and dry, small amount of ecchymosis noted, area currently nontender; patient does have some palpable tenderness in suprapubic region; no lower extremity edema.  Imaging: US RENAL  Result Date: 07/31/2021 CLINICAL DATA:  Right hydronephrosis EXAM: RENAL / URINARY TRACT ULTRASOUND COMPLETE COMPARISON:  None. FINDINGS: Right Kidney: Renal measurements: 9.8 x 5.6 x 6.0 cm = volume: 172 mL. Echogenicity within normal limits. No mass or hydronephrosis visualized. Left Kidney: Renal measurements: 9.9 x 5.0 x 4.7 cm = volume: 121 mL. Echogenicity within normal limits. No mass or hydronephrosis visualized. Bladder: Appears normal for degree of bladder distention. Other: Small amount of free fluid seen in the left flank. IMPRESSION: No hydronephrosis. Small amount of free fluid seen in the left flank. Electronically Signed   By: Yetta Glassman M.D.   On: 07/31/2021 10:01   CT GUIDE TISSUE ABLATION  Result Date: 07/30/2021 INDICATION: 60 year old female with a history of rectal adenocarcinoma with hepatic metastatic disease. She has imaging and biochemical evidence of marginal recurrence at the previously treated ablation site in hepatic segment 6. She presents today for biopsy and repeat percutaneous thermal ablation. EXAM: 1. Ultrasound-guided biopsy hepatic lesion 2. CT and ultrasound-guided percutaneous microwave ablation COMPARISON:  CT abdomen/pelvis 06/17/2021 MEDICATIONS: None. ANESTHESIA/SEDATION: General - as administered by the Anesthesia department FLUOROSCOPY TIME:  None. COMPLICATIONS: None immediate. TECHNIQUE: Informed written consent was obtained from the patient  after a thorough discussion of the procedural risks, benefits and alternatives. All questions were addressed. Maximal Sterile Barrier Technique was utilized including caps, mask, sterile gowns, sterile gloves, sterile drape, hand hygiene and skin antiseptic. A timeout was performed prior to the initiation of the procedure. The patient was placed on the CT gantry. Initial nonenhanced CT imaging was performed. The prior ablation defect is visible. Unfortunately, the liver lesion is essentially occult by noncontrast enhanced CT imaging. Therefore, additional CT imaging was performed with intravenous contrast. This confirms the presence of the mass which has enlarged compared to the relatively recent prior imaging. The mass now measures approximately 3 x 3 x 3 cm. The right upper quadrant was also interrogated with ultrasound. The mass is readily visible by ultrasound. First, biopsy was performed. A suitable skin entry site was selected and marked. A small dermatotomy was made. Under real-time ultrasound guidance, an 18 gauge trocar needle was advanced into the margin the mass. Multiple 18 gauge core biopsies were then obtained coaxially using a bio Pince automated biopsy device. Biopsy specimens were placed in formalin and delivered to pathology for further analysis. Next, 2 NeuWave PR XT probes were selected. The probes were then advanced  and positioned within the cephalad and more caudal aspects of the lesion in parallel separated by approximately 1.5 cm. Following adequate placement of the probes, thermal ablation was then performed. Both probes were powered at 65 watts in the ablation was continued for approximately 6 minutes. The ablation zone was monitored both by ultrasound and also CT imaging at approximately 5 minutes. The ablation zone appears to encompass the entirety of the tumor. Following successful ablation, the probes were removed. Repeat CT imaging was then performed with and without intravenous  contrast. Adequate ablation of the region of tumor. No evidence of immediate complication. FINDINGS: Successful percutaneous tumor ablation without evidence of complication. IMPRESSION: 1. Ultrasound-guided core biopsy of presumed recurrent liver metastasis. 2. Successful percutaneous thermal ablation. Electronically Signed   By: Jacqulynn Cadet M.D.   On: 07/30/2021 12:10   CT BIOPSY  Result Date: 07/30/2021 INDICATION: 59 year old female with a history of rectal adenocarcinoma with hepatic metastatic disease. She has imaging and biochemical evidence of marginal recurrence at the previously treated ablation site in hepatic segment 6. She presents today for biopsy and repeat percutaneous thermal ablation. EXAM: 1. Ultrasound-guided biopsy hepatic lesion 2. CT and ultrasound-guided percutaneous microwave ablation COMPARISON:  CT abdomen/pelvis 06/17/2021 MEDICATIONS: None. ANESTHESIA/SEDATION: General - as administered by the Anesthesia department FLUOROSCOPY TIME:  None. COMPLICATIONS: None immediate. TECHNIQUE: Informed written consent was obtained from the patient after a thorough discussion of the procedural risks, benefits and alternatives. All questions were addressed. Maximal Sterile Barrier Technique was utilized including caps, mask, sterile gowns, sterile gloves, sterile drape, hand hygiene and skin antiseptic. A timeout was performed prior to the initiation of the procedure. The patient was placed on the CT gantry. Initial nonenhanced CT imaging was performed. The prior ablation defect is visible. Unfortunately, the liver lesion is essentially occult by noncontrast enhanced CT imaging. Therefore, additional CT imaging was performed with intravenous contrast. This confirms the presence of the mass which has enlarged compared to the relatively recent prior imaging. The mass now measures approximately 3 x 3 x 3 cm. The right upper quadrant was also interrogated with ultrasound. The mass is readily  visible by ultrasound. First, biopsy was performed. A suitable skin entry site was selected and marked. A small dermatotomy was made. Under real-time ultrasound guidance, an 18 gauge trocar needle was advanced into the margin the mass. Multiple 18 gauge core biopsies were then obtained coaxially using a bio Pince automated biopsy device. Biopsy specimens were placed in formalin and delivered to pathology for further analysis. Next, 2 NeuWave PR XT probes were selected. The probes were then advanced and positioned within the cephalad and more caudal aspects of the lesion in parallel separated by approximately 1.5 cm. Following adequate placement of the probes, thermal ablation was then performed. Both probes were powered at 65 watts in the ablation was continued for approximately 6 minutes. The ablation zone was monitored both by ultrasound and also CT imaging at approximately 5 minutes. The ablation zone appears to encompass the entirety of the tumor. Following successful ablation, the probes were removed. Repeat CT imaging was then performed with and without intravenous contrast. Adequate ablation of the region of tumor. No evidence of immediate complication. FINDINGS: Successful percutaneous tumor ablation without evidence of complication. IMPRESSION: 1. Ultrasound-guided core biopsy of presumed recurrent liver metastasis. 2. Successful percutaneous thermal ablation. Electronically Signed   By: Jacqulynn Cadet M.D.   On: 07/30/2021 12:10    Labs:  CBC: Recent Labs    01/13/21 0933 04/15/21  1531 07/15/21 1342 07/31/21 0527  WBC 6.0 6.5 5.6 12.5*  HGB 12.5 14.0 13.6 12.4  HCT 38.4 43.6 42.1 38.6  PLT 182 234 257 228    COAGS: Recent Labs    07/15/21 1342  INR 1.1    BMP: Recent Labs    04/15/21 1405 06/17/21 1238 07/15/21 1342 07/30/21 0857 07/31/21 0528  NA 140 139 139  --  135  K 4.0 4.1 4.1  --  5.1  CL 104 102 103  --  103  CO2 26 29 30   --  23  GLUCOSE 84 98 80  --  146*   BUN 21* 17 19  --  42*  CALCIUM 9.6 9.9 9.7  --  8.7*  CREATININE 1.03* 0.90 0.84 3.00* 2.75*  GFRNONAA >60 >60 >60  --  20*    LIVER FUNCTION TESTS: Recent Labs    10/30/20 1054 11/14/20 0908 11/28/20 0812 07/15/21 1342  BILITOT 0.5 0.4 0.5 0.6  AST 38 37 39 35  ALT 51* 54* 57* 39  ALKPHOS 135* 138* 133* 97  PROT 7.4 6.9 7.0 7.7  ALBUMIN 3.7 3.6 3.6 3.9    Assessment and Plan: Patient with history of rectal adenocarcinoma with metastatic disease to the liver and imaging and biochemical evidence of marginal recurrence at previously treated ablation site in hepatic segment 6; she underwent image guided liver lesion biopsy and repeat percutaneous thermal ablation yesterday by Dr. Laurence Ferrari; path pending; now with some persistent lower abdominal/suprapubic discomfort and acute renal failure; she is currently afebrile, BP okay, heart rate okay, WBC 12.5, hemoglobin 12.4, platelets normal, creatinine 2.75 down slightly from 3 yesterday, GFR 20; renal ultrasound this morning with no hydronephrosis but small amount of free fluid seen in left flank region; pt supposedly scheduled for outpatient colonoscopy on 11/15 ;above findings discussed with Dr. Laurence Ferrari; will arrange for nephrology consult, avoid nephrotoxic meds/contrast, obtain CT abdomen pelvis without contrast, alert [facility] for possible need of further medical management; patient updated with plan.   Electronically Signed: D. Rowe Robert, PA-C 07/31/2021, 10:39 AM   I spent a total of 20 minutes at the the patient's bedside AND on the patient's hospital floor or unit, greater than 50% of which was counseling/coordinating care for image guided liver lesion biopsy and percutaneous thermal ablation

## 2021-07-31 NOTE — Anesthesia Procedure Notes (Signed)

## 2021-07-31 NOTE — Op Note (Signed)
Operative Note  Preoperative diagnosis:  1.  Right distal ureteral injury 2. History of re-implanted right ureter 3. Metastatic rectal cancer  Postoperative diagnosis: 1.  Right distal ureteral injury 2. History of re-implanted right ureter 3. Metastatic rectal cancer  Procedure(s): 1.  Cystoscopy 2. Right retrograde pyelogram with interpretation 3. Right ureteral stent placement 4. Fluoroscopy <1 hour with intraoperative interpretation 5. Foley catheter placement  Surgeon: Rexene Alberts, MD  Assistants:  Aldine Contes, PGY-4  Anesthesia:  General  Complications:  None  EBL:  Minimal  Specimens: 1. None  Drains/Catheters: 1.  Right 6Fr x 26cm ureteral stent  Intraoperative findings:   Cystoscopy demonstrated no suspicious lesions, masses, stones or other pathology. 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 A safety wire was able to be passed into the kidney A distal right retrograde pyelogram demonstrated significant extravasation of contrast medially. A repeat retrograde pyelogram demonstrated no hydronephrosis with no filling defects in the collecting system. Successful right ureteral stent placement with curl in the renal pelvis and bladder respectively.  Indication:  Norma Colon is a 60 y.o. female with a history of a right ureteral stricture as result of urolithiasis and possibly rectal cancer s/p low anterior resection in 09/2018 followed by adjuvant chemotherapy and radiation. She underwent a right robotic assisted laparoscopic ureteroneocystotomy with psoas hitch by Dr. Alyson Ingles on 08/03/2019.  The ureter was implanted at the dome of the bladder. She unfortunately developed metastatic rectal adenocarcinoma with lesions in her liver. She has been receiving microwave ablations to the lesions by Dr. Laurence Ferrari.  She underwent a repeat microwave ablation on 07/30/2021. A Foley catheter was placed perioperatively and  unfortunately inadvertently placed into the re-implanted distal ureter causing an injury.   After reviewing the management options for treatment, she elected to proceed with the above surgical procedure(s). We have discussed the potential benefits and risks of the procedure, side effects of the proposed treatment, the likelihood of the patient achieving the goals of the procedure, and any potential problems that might occur during the procedure or recuperation. Informed consent has been obtained.  Description of procedure: The patient was taken to the operating room and general anesthesia was induced.  The patient was placed in the dorsal lithotomy position, prepped and draped in the usual sterile fashion, and preoperative antibiotics were administered. A preoperative time-out was performed.   Cystourethroscopy was performed with judicious use of irrigation. The patient's urethra was examined and was normal. The bladder was then systematically examined in its entirety. There was no evidence of any bladder tumors, stones, or other mucosal pathology.    Attention was then turned to the implanted right ureter on the dome of the bladder. A 0.038 sensor wire was passed through the right ureter and under fluoroscopic guidance this was passed into the right kidney. Next, alongside this wire, a distal right retrograde was performed demonstrating significant extravasation of contrast medially in the distal right ureter.  A small amount of contrast was seen in the more proximal ureter.  We then passed a separate wire through this 5 [place] open-ended catheter and advanced this into the more proximal location.  We repeated a right retrograde pyelogram demonstrating no hydronephrosis with filling of the calyces.  There were no filling defects within the collecting system.  We then placed a separate 0.38 sensor wire into the renal pelvis and remove the 5 [place] open-ended catheter after measuring the length of the ureter  to be  approximately 25 cm.  We then remove the safety wire.  A 6Fr x 26cm ureteral stent was advance over the wire. The stent was positioned appropriately under fluoroscopic and cystoscopic guidance.  The wire was then removed with an adequate stent curl noted in the renal pelvis as well as in the bladder.  This was seen on fluoroscopic images.  Next, a 63 French Foley catheter was carefully placed into the urethra for only a short distance and the balloon was then instilled with 10 mL water.  A final fluoroscopic image demonstrated successful placement of Foley catheter balloon in the bladder that was easily manipulated within the urethra.  The right ureteral stent was seen in the appropriate position.  The patient appeared to tolerate the procedure well and without complications.  The patient was able to be awakened and transferred to the recovery unit in satisfactory condition.   Plan: We will plan to leave the right ureteral stent in place for the 6 weeks.  She will be taken back to the operating room in 6 weeks for repeat right retrograde pyelogram with possible right ureteral stent removal.  We will need to leave Foley catheter to gravity drainage for a prolonged period of time to ensure no reflux into her abdominal cavity.  Matt R. [facility]  Pager: [phone]

## 2021-07-31 NOTE — Progress Notes (Signed)
Patient is currently in hospital-Dr.Cirigliano is aware. No changes in medical hx per pt. Patient will check with surgeon if ok to proceed with colonoscopy and she will call us back to let us know.   PV packet mailed to pt and sent to mychart patient is aware.

## 2021-07-31 NOTE — Progress Notes (Addendum)
Day of Surgery Subjective: C/o abdominal pain, improved. Denies fevers or chills. No nausea or emesis.  Objective: Vital signs in last 24 hours: Temp:  [97.6 F (36.4 C)-98 F (36.7 C)] 98 F (36.7 C) (10/27 1354) Pulse Rate:  [82-90] 87 (10/27 1354) Resp:  [17-18] 17 (10/27 1354) BP: (110-139)/(70-98) 139/98 (10/27 1354) SpO2:  [97 %-99 %] 97 % (10/27 1354) Weight:  [98 kg] 98 kg (10/27 1215)  Intake/Output from previous day: 10/26 0701 - 10/27 0700 In: 2976.2 [P.O.:580; I.V.:2208.7; IV Piggyback:187.5] Out: 325 [Urine:325] Intake/Output this shift: No intake/output data recorded.  Physical Exam:  General: Alert and oriented CV: RRR Lungs: Clear Abdomen: Soft, ND, NT Ext: NT, No erythema  Lab Results: Recent Labs    07/31/21 0527  HGB 12.4  HCT 38.6   BMET Recent Labs    07/30/21 0857 07/31/21 0528  NA  --  135  K  --  5.1  CL  --  103  CO2  --  23  GLUCOSE  --  146*  BUN  --  42*  CREATININE 3.00* 2.75*  CALCIUM  --  8.7*     Studies/Results: CT ABDOMEN PELVIS WO CONTRAST  Result Date: 07/31/2021 CLINICAL DATA:  Lower abdominal pain with acute renal failure. Suprapubic pain. 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 EXAM: CT ABDOMEN AND PELVIS WITHOUT CONTRAST TECHNIQUE: Multidetector CT imaging of the abdomen and pelvis was performed following the standard protocol without IV contrast. COMPARISON:  06/17/2021 FINDINGS: Lower chest: Basilar atelectasis bilaterally. Hepatobiliary: Ablation defect noted posterior right liver, more pronounced than on the prior study with posterior high attenuation material likely treatment related or small focus of hemorrhage. Similar appearance of reported ablation defect lateral segment left liver. The liver shows diffusely decreased attenuation suggesting fat deposition. Tiny hyperenhancing lesion medial segment left liver seen on the previous study is not evident on today's exam. High attenuation material in the  gallbladder lumen is compatible with vicarious excretion of contrast material. Gallstones evident. No intrahepatic or extrahepatic biliary dilation. Pancreas: No focal mass lesion. No dilatation of the main duct. No intraparenchymal cyst. No peripancreatic edema. Spleen: No splenomegaly. No focal mass lesion. Adrenals/Urinary Tract: No adrenal nodule or mass. Contrast excretion of both kidneys compatible with contrast material from yesterday's CT scan. No evidence for hydroureter. The urinary bladder appears normal for the degree of distention. Stomach/Bowel: Stomach is moderately distended with food and fluid. Duodenum is normally positioned as is the ligament of Treitz. No small bowel wall thickening. No small bowel dilatation. The terminal ileum is normal. The appendix is not well visualized, but there is no edema or inflammation in the region of the cecum. Prominent stool volume right and transverse colon. Suture line noted in the rectum. Vascular/Lymphatic: No abdominal aortic aneurysm. No abdominal lymphadenopathy. No pelvic sidewall lymphadenopathy. Reproductive: No adnexal mass. Other: Since yesterday's study, there is moderate volume high attenuation free fluid in the abdomen and pelvis, seen around the liver and spleen, within the mesentery, and in the cul-de-sac. Attenuation of the fluid is higher than would be expected for serous fluid suggesting hemoperitoneum or opacified fluid. Of note, there is fairly high attenuation fluid in the left lateral pelvis adjacent to the acetabulum and in the posterior cul-de-sac to the right of the rectum. Musculoskeletal: No worrisome lytic or sclerotic osseous abnormality. L5-S1 spondylolisthesis is similar to prior. IMPRESSION: 1. Interval development of moderate volume high attenuation free fluid in the abdomen and pelvis. Attenuation of the fluid is higher  than would be expected for serous fluid. I discussed this case with Dr. Laurence Ferrari at the time of  interpretation. Of note, the patient developed progressive right-sided hydronephrosis and proximal hydroureter during the 1 hour ablation procedure yesterday, well demonstrated on localization scans performed on that study. On today's study, the right ureter can be seen to have an ectopic insertion high on the posterior dome of the bladder. The distal right ureter just proximal to the UVJ is irregular and there appears to be a string like subtle band of extravasated urine adjacent to the right UVJ. As such, these findings are concerning for distal right ureteral injury and ureteral leak as the etiology of the new intraperitoneal fluid. Given that the free fluid is intraperitoneal, this would suggest that the right UVJ is not retroperitoneal in location. However, the patient was already excreting contrast into the collecting systems and ureters by the end of yesterday's study and the intraperitoneal fluid today is not substantially opacified, possibly secondary to some reabsorption of the contrast material via the peritoneum although a component of hemoperitoneum could also have this appearance and cannot be entirely excluded on this exam. 2. Posterior right hepatic lobe ablation defect is increased in size in the interval, consistent with retreatment. Similar appearance of ablation defect in the lateral segment left liver. 3. Cholelithiasis. 4. Prominent stool volume right and transverse colon. Imaging features could be compatible with clinical constipation. 5. L5-S1 spondylolisthesis, similar to prior. Electronically Signed   By: Misty Stanley M.D.   On: 07/31/2021 15:53   US RENAL  Result Date: 07/31/2021 CLINICAL DATA:  Right hydronephrosis EXAM: RENAL / URINARY TRACT ULTRASOUND COMPLETE COMPARISON:  None. FINDINGS: Right Kidney: Renal measurements: 9.8 x 5.6 x 6.0 cm = volume: 172 mL. Echogenicity within normal limits. No mass or hydronephrosis visualized. Left Kidney: Renal measurements: 9.9 x 5.0 x 4.7 cm  = volume: 121 mL. Echogenicity within normal limits. No mass or hydronephrosis visualized. Bladder: Appears normal for degree of bladder distention. Other: Small amount of free fluid seen in the left flank. IMPRESSION: No hydronephrosis. Small amount of free fluid seen in the left flank. Electronically Signed   By: Yetta Glassman M.D.   On: 07/31/2021 10:01   CT GUIDE TISSUE ABLATION  Result Date: 07/30/2021 INDICATION: 60 year old female with a history of rectal adenocarcinoma with hepatic metastatic disease. She has imaging and biochemical evidence of marginal recurrence at the previously treated ablation site in hepatic segment 6. She presents today for biopsy and repeat percutaneous thermal ablation. EXAM: 1. Ultrasound-guided biopsy hepatic lesion 2. CT and ultrasound-guided percutaneous microwave ablation COMPARISON:  CT abdomen/pelvis 06/17/2021 MEDICATIONS: None. ANESTHESIA/SEDATION: General - as administered by the Anesthesia department FLUOROSCOPY TIME:  None. COMPLICATIONS: None immediate. TECHNIQUE: Informed written consent was obtained from the patient after a thorough discussion of the procedural risks, benefits and alternatives. All questions were addressed. Maximal Sterile Barrier Technique was utilized including caps, mask, sterile gowns, sterile gloves, sterile drape, hand hygiene and skin antiseptic. A timeout was performed prior to the initiation of the procedure. The patient was placed on the CT gantry. Initial nonenhanced CT imaging was performed. The prior ablation defect is visible. Unfortunately, the liver lesion is essentially occult by noncontrast enhanced CT imaging. Therefore, additional CT imaging was performed with intravenous contrast. This confirms the presence of the mass which has enlarged compared to the relatively recent prior imaging. The mass now measures approximately 3 x 3 x 3 cm. The right upper quadrant was also interrogated with  ultrasound. The mass is readily  visible by ultrasound. First, biopsy was performed. A suitable skin entry site was selected and marked. A small dermatotomy was made. Under real-time ultrasound guidance, an 18 gauge trocar needle was advanced into the margin the mass. Multiple 18 gauge core biopsies were then obtained coaxially using a bio Pince automated biopsy device. Biopsy specimens were placed in formalin and delivered to pathology for further analysis. Next, 2 NeuWave PR XT probes were selected. The probes were then advanced and positioned within the cephalad and more caudal aspects of the lesion in parallel separated by approximately 1.5 cm. Following adequate placement of the probes, thermal ablation was then performed. Both probes were powered at 65 watts in the ablation was continued for approximately 6 minutes. The ablation zone was monitored both by ultrasound and also CT imaging at approximately 5 minutes. The ablation zone appears to encompass the entirety of the tumor. Following successful ablation, the probes were removed. Repeat CT imaging was then performed with and without intravenous contrast. Adequate ablation of the region of tumor. No evidence of immediate complication. FINDINGS: Successful percutaneous tumor ablation without evidence of complication. IMPRESSION: 1. Ultrasound-guided core biopsy of presumed recurrent liver metastasis. 2. Successful percutaneous thermal ablation. Electronically Signed   By: Jacqulynn Cadet M.D.   On: 07/30/2021 12:10   CT BIOPSY  Result Date: 07/30/2021 INDICATION: 60 year old female with a history of rectal adenocarcinoma with hepatic metastatic disease. She has imaging and biochemical evidence of marginal recurrence at the previously treated ablation site in hepatic segment 6. She presents today for biopsy and repeat percutaneous thermal ablation. EXAM: 1. Ultrasound-guided biopsy hepatic lesion 2. CT and ultrasound-guided percutaneous microwave ablation COMPARISON:  CT abdomen/pelvis  06/17/2021 MEDICATIONS: None. ANESTHESIA/SEDATION: General - as administered by the Anesthesia department FLUOROSCOPY TIME:  None. COMPLICATIONS: None immediate. TECHNIQUE: Informed written consent was obtained from the patient after a thorough discussion of the procedural risks, benefits and alternatives. All questions were addressed. Maximal Sterile Barrier Technique was utilized including caps, mask, sterile gowns, sterile gloves, sterile drape, hand hygiene and skin antiseptic. A timeout was performed prior to the initiation of the procedure. The patient was placed on the CT gantry. Initial nonenhanced CT imaging was performed. The prior ablation defect is visible. Unfortunately, the liver lesion is essentially occult by noncontrast enhanced CT imaging. Therefore, additional CT imaging was performed with intravenous contrast. This confirms the presence of the mass which has enlarged compared to the relatively recent prior imaging. The mass now measures approximately 3 x 3 x 3 cm. The right upper quadrant was also interrogated with ultrasound. The mass is readily visible by ultrasound. First, biopsy was performed. A suitable skin entry site was selected and marked. A small dermatotomy was made. Under real-time ultrasound guidance, an 18 gauge trocar needle was advanced into the margin the mass. Multiple 18 gauge core biopsies were then obtained coaxially using a bio Pince automated biopsy device. Biopsy specimens were placed in formalin and delivered to pathology for further analysis. Next, 2 NeuWave PR XT probes were selected. The probes were then advanced and positioned within the cephalad and more caudal aspects of the lesion in parallel separated by approximately 1.5 cm. Following adequate placement of the probes, thermal ablation was then performed. Both probes were powered at 65 watts in the ablation was continued for approximately 6 minutes. The ablation zone was monitored both by ultrasound and also CT  imaging at approximately 5 minutes. The ablation zone appears to encompass the entirety  of the tumor. Following successful ablation, the probes were removed. Repeat CT imaging was then performed with and without intravenous contrast. Adequate ablation of the region of tumor. No evidence of immediate complication. FINDINGS: Successful percutaneous tumor ablation without evidence of complication. IMPRESSION: 1. Ultrasound-guided core biopsy of presumed recurrent liver metastasis. 2. Successful percutaneous thermal ablation. Electronically Signed   By: Jacqulynn Cadet M.D.   On: 07/30/2021 12:10    Assessment/Plan: Right distal ureteral injury: Presumed to be due to inadvertent placement of Foley catheter into her ectopic reimplanted ureter and subsequently balloon dilated with a Foley catheter balloon.  CT A/P 07/31/2021 with extravasation of urine adjacent to the right distal ureter and free fluid in the pelvis. S/p cysto, R RPG confirming right distal ureteral injury, right stent placement 07/31/2021 Psuedo-azotemia due to intraperitoneal urine leakage: creatinine 2.75 at time of consultation from baseline <1. Metastatic rectal cancer s/p low anterior resection in 09/2018 followed by adjuvant chemotherapy and radiation and undergoing microwave ablations to liver mets  -Will plan to leave stent in place for 6 weeks with likely plan for cysto, R RPG prior to removal. -Leave foley catheter to gravity drainage for now. Ideally this will be left in place for several weeks to allow injury to heal without reflux of urine around the stent. Discussed with patient who understands. She would like to followup with Dr. Alyson Ingles. I will notify him and arrange followup   LOS: 0 days   Matt R. Bryanna Yim MD 07/31/2021, 10:02 PM [facility]  Pager: [phone]

## 2021-07-31 NOTE — Progress Notes (Signed)
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 Authorizing Provider  alprazolam Duanne Moron) 2 MG tablet Take 2 mg by mouth every morning.    Yes [provider]  Ascorbic Acid (VITAMIN C) 1000 MG tablet Take 1,000 mg by mouth daily.   Yes [provider]  aspirin EC 81 MG tablet Take 81 mg by mouth every morning. Swallow whole.    Yes [provider]  Biotin 10 MG CAPS Take 10 mg by mouth daily.   Yes [provider]  buPROPion (WELLBUTRIN XL) 300 MG 24 hr tablet Take 300 mg by mouth every morning.    Yes [provider]  calcium carbonate (OSCAL) 1500 (600 Ca) MG TABS tablet Take 600 mg of elemental calcium by mouth daily.   Yes [provider]  Cholecalciferol (DIALYVITE VITAMIN D 5000) 125 MCG (5000 UT) capsule Take 5,000 Units by mouth daily.   Yes [provider]  cyclobenzaprine (FLEXERIL) 10 MG tablet Take 20 mg by mouth at bedtime.   Yes [provider]  ECHINACEA-ZINC-VITAMIN C PO Take 1 tablet by mouth daily.   Yes [provider]  HYDROcodone-acetaminophen (NORCO) 10-325 MG tablet Take 1-2 tablets by mouth every 6 (six) hours as needed for moderate pain or severe pain (headache). Post-Operatively. 08/05/19  Yes Alexis Frock, MD  lidocaine-prilocaine (EMLA) cream Apply 1 application topically as directed. Apply 1-2 hours prior to  IV stick and cover with plastic wrap 11/28/20  Yes Ladell Pier, MD  METAMUCIL FIBER PO Take 1 capsule by mouth daily.   Yes [provider]  Methylcellulose, Laxative, (CITRUCEL PO) Take 1 tablet by mouth daily.   Yes [provider]  Multiple Vitamin (MULTIVITAMIN WITH MINERALS) TABS tablet Take 1 tablet by mouth daily.   Yes [provider]  Multiple Vitamins-Minerals (IMMUNE SUPPORT PO) Take 1 tablet by mouth daily.   Yes [provider]  Omega-3 Fatty Acids (FISH OIL) 1000 MG CAPS Take 1,000 mg by mouth daily.   Yes [provider]  Prenatal Vit-Fe Fumarate-FA (PRENATAL PO) Take 1 tablet by mouth daily.   Yes [provider]  Probiotic Product (PROBIOTIC PO) Take 1 tablet by mouth every morning. Acidophilus 100 million   Yes [provider]  rOPINIRole (REQUIP) 1 MG tablet Take 1 mg by mouth at bedtime.   Yes [provider]  TURMERIC PO Take 1 tablet by mouth daily.   Yes [provider]  vitamin B-12 (CYANOCOBALAMIN) 1000 MCG tablet Take 1,000 mcg by mouth daily.   Yes [provider]  diphenoxylate-atropine (LOMOTIL) 2.5-0.025 MG tablet TAKE 1 TO 2 TABLETS BY MOUTH FOUR TIMES DAILY AS NEEDED FOR DIARRHEA OR LOOSE STOOLS Patient not taking: Reported on 07/31/2021 03/10/21   Owens Shark, NP  lubiprostone (AMITIZA) 24 MCG capsule Take 1 capsule (24 mcg total) by mouth 2 (two) times daily with a meal. 07/16/21 01/07/23  Cirigliano, Vito V, DO  Na Sulfate-K Sulfate-Mg Sulf  17.5-3.13-1.6 GM/177ML SOLN Take 1 kit by mouth as directed. May use generic Suprep 07/31/21   Cirigliano, Vito V, DO     Vital Signs: BP (!) 139/98 (BP Location: Left Arm)   Pulse 87   Temp 98 F (36.7 C) (Oral)   Resp 17   Ht 5' 8.5" (1.74 m)   Wt 216 lb 0.8 oz (98 kg)   LMP  (LMP Unknown)   SpO2 97%   BMI 32.37 kg/m   Physical Exam awake/alert; no sig changes from PE this am; tender suprapubic region  Imaging: CT  ABDOMEN PELVIS WO CONTRAST  Result Date: 07/31/2021 CLINICAL DATA:  Lower abdominal pain with acute renal failure. Suprapubic pain. 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 EXAM: CT ABDOMEN AND PELVIS WITHOUT CONTRAST TECHNIQUE: Multidetector CT imaging of the abdomen and pelvis was performed following the standard protocol without IV contrast. COMPARISON:  06/17/2021 FINDINGS: Lower chest: Basilar atelectasis bilaterally. Hepatobiliary: Ablation defect noted posterior right liver, more pronounced than on the prior study with posterior high attenuation material likely treatment related or small focus of hemorrhage. Similar appearance of reported ablation defect lateral segment left liver. The liver shows diffusely decreased attenuation suggesting fat deposition. Tiny hyperenhancing lesion medial segment left liver seen on the previous study is not evident on today's exam. High attenuation material in the gallbladder lumen is compatible with vicarious excretion of contrast material. Gallstones evident. No intrahepatic or extrahepatic biliary dilation. Pancreas: No focal mass lesion. No dilatation of the main duct. No intraparenchymal cyst. No peripancreatic edema. Spleen: No splenomegaly. No focal mass lesion. Adrenals/Urinary Tract: No adrenal nodule or mass. Contrast excretion of both kidneys compatible with contrast material from yesterday's CT scan. No evidence for hydroureter. The urinary bladder appears normal for the degree of distention. Stomach/Bowel: Stomach is moderately distended with food and fluid. Duodenum is normally positioned as is the ligament of Treitz. No small bowel wall thickening. No small bowel dilatation. The terminal ileum is normal. The appendix is not well visualized, but there is no edema or inflammation in the region of the cecum. Prominent stool volume right and transverse colon. Suture line noted in the rectum. Vascular/Lymphatic: No abdominal aortic aneurysm. No abdominal  lymphadenopathy. No pelvic sidewall lymphadenopathy. Reproductive: No adnexal mass. Other: Since yesterday's study, there is moderate volume high attenuation free fluid in the abdomen and pelvis, seen around the liver and spleen, within the mesentery, and in the cul-de-sac. Attenuation of the fluid is higher than would be expected for serous fluid suggesting hemoperitoneum or opacified fluid. Of note, there is fairly high attenuation fluid in the left lateral pelvis adjacent to the acetabulum and in the posterior cul-de-sac to the right of the rectum. Musculoskeletal: No worrisome lytic or sclerotic osseous abnormality. L5-S1 spondylolisthesis is similar to prior. IMPRESSION: 1. Interval development of moderate volume high attenuation free fluid in the abdomen and pelvis. Attenuation of the fluid is higher than would be expected for serous fluid. I discussed this case with Dr. Laurence Ferrari at the time of interpretation. Of note, the patient developed progressive right-sided hydronephrosis and proximal hydroureter during the 1 hour ablation procedure yesterday, well demonstrated on localization scans performed on that study. On today's study, the right ureter can be seen to have an ectopic insertion high on the posterior dome of the bladder. The distal right ureter just proximal to the UVJ is irregular and there appears to be a string like subtle band of extravasated urine adjacent to the right UVJ. As such, these  findings are concerning for distal right ureteral injury and ureteral leak as the etiology of the new intraperitoneal fluid. Given that the free fluid is intraperitoneal, this would suggest that the right UVJ is not retroperitoneal in location. However, the patient was already excreting contrast into the collecting systems and ureters by the end of yesterday's study and the intraperitoneal fluid today is not substantially opacified, possibly secondary to some reabsorption of the contrast material via the  peritoneum although a component of hemoperitoneum could also have this appearance and cannot be entirely excluded on this exam. 2. Posterior right hepatic lobe ablation defect is increased in size in the interval, consistent with retreatment. Similar appearance of ablation defect in the lateral segment left liver. 3. Cholelithiasis. 4. Prominent stool volume right and transverse colon. Imaging features could be compatible with clinical constipation. 5. L5-S1 spondylolisthesis, similar to prior. Electronically Signed   By: Misty Stanley M.D.   On: 07/31/2021 15:53   US RENAL  Result Date: 07/31/2021 CLINICAL DATA:  Right hydronephrosis EXAM: RENAL / URINARY TRACT ULTRASOUND COMPLETE COMPARISON:  None. FINDINGS: Right Kidney: Renal measurements: 9.8 x 5.6 x 6.0 cm = volume: 172 mL. Echogenicity within normal limits. No mass or hydronephrosis visualized. Left Kidney: Renal measurements: 9.9 x 5.0 x 4.7 cm = volume: 121 mL. Echogenicity within normal limits. No mass or hydronephrosis visualized. Bladder: Appears normal for degree of bladder distention. Other: Small amount of free fluid seen in the left flank. IMPRESSION: No hydronephrosis. Small amount of free fluid seen in the left flank. Electronically Signed   By: Yetta Glassman M.D.   On: 07/31/2021 10:01   CT GUIDE TISSUE ABLATION  Result Date: 07/30/2021 INDICATION: 60 year old female with a history of rectal adenocarcinoma with hepatic metastatic disease. She has imaging and biochemical evidence of marginal recurrence at the previously treated ablation site in hepatic segment 6. She presents today for biopsy and repeat percutaneous thermal ablation. EXAM: 1. Ultrasound-guided biopsy hepatic lesion 2. CT and ultrasound-guided percutaneous microwave ablation COMPARISON:  CT abdomen/pelvis 06/17/2021 MEDICATIONS: None. ANESTHESIA/SEDATION: General - as administered by the Anesthesia department FLUOROSCOPY TIME:  None. COMPLICATIONS: None immediate.  TECHNIQUE: Informed written consent was obtained from the patient after a thorough discussion of the procedural risks, benefits and alternatives. All questions were addressed. Maximal Sterile Barrier Technique was utilized including caps, mask, sterile gowns, sterile gloves, sterile drape, hand hygiene and skin antiseptic. A timeout was performed prior to the initiation of the procedure. The patient was placed on the CT gantry. Initial nonenhanced CT imaging was performed. The prior ablation defect is visible. Unfortunately, the liver lesion is essentially occult by noncontrast enhanced CT imaging. Therefore, additional CT imaging was performed with intravenous contrast. This confirms the presence of the mass which has enlarged compared to the relatively recent prior imaging. The mass now measures approximately 3 x 3 x 3 cm. The right upper quadrant was also interrogated with ultrasound. The mass is readily visible by ultrasound. First, biopsy was performed. A suitable skin entry site was selected and marked. A small dermatotomy was made. Under real-time ultrasound guidance, an 18 gauge trocar needle was advanced into the margin the mass. Multiple 18 gauge core biopsies were then obtained coaxially using a bio Pince automated biopsy device. Biopsy specimens were placed in formalin and delivered to pathology for further analysis. Next, 2 NeuWave PR XT probes were selected. The probes were then advanced and positioned within the cephalad and more caudal aspects of the lesion in parallel separated by  approximately 1.5 cm. Following adequate placement of the probes, thermal ablation was then performed. Both probes were powered at 65 watts in the ablation was continued for approximately 6 minutes. The ablation zone was monitored both by ultrasound and also CT imaging at approximately 5 minutes. The ablation zone appears to encompass the entirety of the tumor. Following successful ablation, the probes were removed. Repeat  CT imaging was then performed with and without intravenous contrast. Adequate ablation of the region of tumor. No evidence of immediate complication. FINDINGS: Successful percutaneous tumor ablation without evidence of complication. IMPRESSION: 1. Ultrasound-guided core biopsy of presumed recurrent liver metastasis. 2. Successful percutaneous thermal ablation. Electronically Signed   By: Jacqulynn Cadet M.D.   On: 07/30/2021 12:10   CT BIOPSY  Result Date: 07/30/2021 INDICATION: 60 year old female with a history of rectal adenocarcinoma with hepatic metastatic disease. She has imaging and biochemical evidence of marginal recurrence at the previously treated ablation site in hepatic segment 6. She presents today for biopsy and repeat percutaneous thermal ablation. EXAM: 1. Ultrasound-guided biopsy hepatic lesion 2. CT and ultrasound-guided percutaneous microwave ablation COMPARISON:  CT abdomen/pelvis 06/17/2021 MEDICATIONS: None. ANESTHESIA/SEDATION: General - as administered by the Anesthesia department FLUOROSCOPY TIME:  None. COMPLICATIONS: None immediate. TECHNIQUE: Informed written consent was obtained from the patient after a thorough discussion of the procedural risks, benefits and alternatives. All questions were addressed. Maximal Sterile Barrier Technique was utilized including caps, mask, sterile gowns, sterile gloves, sterile drape, hand hygiene and skin antiseptic. A timeout was performed prior to the initiation of the procedure. The patient was placed on the CT gantry. Initial nonenhanced CT imaging was performed. The prior ablation defect is visible. Unfortunately, the liver lesion is essentially occult by noncontrast enhanced CT imaging. Therefore, additional CT imaging was performed with intravenous contrast. This confirms the presence of the mass which has enlarged compared to the relatively recent prior imaging. The mass now measures approximately 3 x 3 x 3 cm. The right upper quadrant was  also interrogated with ultrasound. The mass is readily visible by ultrasound. First, biopsy was performed. A suitable skin entry site was selected and marked. A small dermatotomy was made. Under real-time ultrasound guidance, an 18 gauge trocar needle was advanced into the margin the mass. Multiple 18 gauge core biopsies were then obtained coaxially using a bio Pince automated biopsy device. Biopsy specimens were placed in formalin and delivered to pathology for further analysis. Next, 2 NeuWave PR XT probes were selected. The probes were then advanced and positioned within the cephalad and more caudal aspects of the lesion in parallel separated by approximately 1.5 cm. Following adequate placement of the probes, thermal ablation was then performed. Both probes were powered at 65 watts in the ablation was continued for approximately 6 minutes. The ablation zone was monitored both by ultrasound and also CT imaging at approximately 5 minutes. The ablation zone appears to encompass the entirety of the tumor. Following successful ablation, the probes were removed. Repeat CT imaging was then performed with and without intravenous contrast. Adequate ablation of the region of tumor. No evidence of immediate complication. FINDINGS: Successful percutaneous tumor ablation without evidence of complication. IMPRESSION: 1. Ultrasound-guided core biopsy of presumed recurrent liver metastasis. 2. Successful percutaneous thermal ablation. Electronically Signed   By: Jacqulynn Cadet M.D.   On: 07/30/2021 12:10    Labs:  CBC: Recent Labs    01/13/21 0933 04/15/21 1531 07/15/21 1342 07/31/21 0527  WBC 6.0 6.5 5.6 12.5*  HGB 12.5 14.0 13.6  12.4  HCT 38.4 43.6 42.1 38.6  PLT 182 234 257 228    COAGS: Recent Labs    07/15/21 1342  INR 1.1    BMP: Recent Labs    04/15/21 1405 06/17/21 1238 07/15/21 1342 07/30/21 0857 07/31/21 0528  NA 140 139 139  --  135  K 4.0 4.1 4.1  --  5.1  CL 104 102 103  --  103   CO2 26 29 30   --  23  GLUCOSE 84 98 80  --  146*  BUN 21* 17 19  --  42*  CALCIUM 9.6 9.9 9.7  --  8.7*  CREATININE 1.03* 0.90 0.84 3.00* 2.75*  GFRNONAA >60 >60 >60  --  20*    LIVER FUNCTION TESTS: Recent Labs    10/30/20 1054 11/14/20 0908 11/28/20 0812 07/15/21 1342  BILITOT 0.5 0.4 0.5 0.6  AST 38 37 39 35  ALT 51* 54* 57* 39  ALKPHOS 135* 138* 133* 97  PROT 7.4 6.9 7.0 7.7  ALBUMIN 3.7 3.6 3.6 3.9    Assessment and Plan: Patient with history of rectal adenocarcinoma with metastatic disease to the liver and imaging and biochemical evidence of marginal recurrence at previously treated ablation site in hepatic segment 6; she underwent image guided liver lesion biopsy and repeat percutaneous thermal ablation yesterday by Dr. Laurence Ferrari; path revealed metastatic rectal cancer; now with some persistent lower abdominal/suprapubic discomfort which has persisted since traumatic Foley cath removal in PACU yesterday evening along with acute renal failure; patient seen by both [facility] and nephrology-appreciate their assistance; CT abdomen pelvis without contrast this evening revealed:  1. Interval development of moderate volume high attenuation free fluid in the abdomen and pelvis. Attenuation of the fluid is higher than would be expected for serous fluid. I discussed this case with Dr. Laurence Ferrari at the time of interpretation. Of note, the patient developed progressive right-sided hydronephrosis and proximal hydroureter during the 1 hour ablation procedure yesterday, well demonstrated on localization scans performed on that study. On today's study, the right ureter can be seen to have an ectopic insertion high on the posterior dome of the bladder. The distal right ureter just proximal to the UVJ is irregular and there appears to be a string like subtle band of extravasated urine adjacent to the right UVJ. As such, these findings are concerning for distal right ureteral injury and  ureteral leak as the etiology of the new intraperitoneal fluid. Given that the free fluid is intraperitoneal, this would suggest that the right UVJ is not retroperitoneal in location. However, the patient was already excreting contrast into the collecting systems and ureters by the end of yesterday's study and the intraperitoneal fluid today is not substantially opacified, possibly secondary to some reabsorption of the contrast material via the peritoneum although a component of hemoperitoneum could also have this appearance and cannot be entirely excluded on this exam. 2. Posterior right hepatic lobe ablation defect is increased in size in the interval, consistent with retreatment. Similar appearance of ablation defect in the lateral segment left liver. 3. Cholelithiasis. 4. Prominent stool volume right and transverse colon. Imaging features could be compatible with clinical constipation. 5. L5-S1 spondylolisthesis, similar to prior  Imaging studies were reviewed by Dr. Laurence Ferrari.  Urology service will be consulted.  Patient updated with plans.  Electronically Signed: D. Rowe Robert, PA-C 07/31/2021, 4:15 PM   I spent a total of 15 minutes at the the patient's bedside AND on the patient's hospital floor or unit,  greater than 50% of which was counseling/coordinating care for image guided liver lesion biopsy and thermal ablation    Patient ID: Brittnay Pigman, female   DOB: 1961/06/16, 60 y.o.   MRN: [id number]

## 2021-08-01 ENCOUNTER — Encounter (HOSPITAL_COMMUNITY): Payer: Self-pay | Admitting: Urology

## 2021-08-01 LAB — URINALYSIS, COMPLETE (UACMP) WITH MICROSCOPIC
Bilirubin Urine: NEGATIVE
Glucose, UA: NEGATIVE mg/dL
Ketones, ur: NEGATIVE mg/dL
Nitrite: NEGATIVE
Protein, ur: 30 mg/dL — AB
RBC / HPF: >50 RBC/hpf — ABNORMAL HIGH (ref 0–5)
Specific Gravity, Urine: 1.02 (ref 1.005–1.030)
pH: 5 (ref 5.0–8.0)

## 2021-08-01 LAB — CBC WITH DIFFERENTIAL/PLATELET
Abs Immature Granulocytes: 0.04 10*3/uL (ref 0.00–0.07)
Basophils Absolute: 0 10*3/uL (ref 0.0–0.1)
Basophils Relative: 0 %
Eosinophils Absolute: 0 10*3/uL (ref 0.0–0.5)
Eosinophils Relative: 0 %
HCT: 38.3 % (ref 36.0–46.0)
Hemoglobin: 12.2 g/dL (ref 12.0–15.0)
Immature Granulocytes: 0 %
Lymphocytes Relative: 4 %
Lymphs Abs: 0.4 10*3/uL — ABNORMAL LOW (ref 0.7–4.0)
MCH: 31 pg (ref 26.0–34.0)
MCHC: 31.9 g/dL (ref 30.0–36.0)
MCV: 97.2 fL (ref 80.0–100.0)
Monocytes Absolute: 0.2 10*3/uL (ref 0.1–1.0)
Monocytes Relative: 2 %
Neutro Abs: 9.4 10*3/uL — ABNORMAL HIGH (ref 1.7–7.7)
Neutrophils Relative %: 94 %
Platelets: 196 10*3/uL (ref 150–400)
RBC: 3.94 MIL/uL (ref 3.87–5.11)
RDW: 13.6 % (ref 11.5–15.5)
WBC: 10 10*3/uL (ref 4.0–10.5)
nRBC: 0 % (ref 0.0–0.2)

## 2021-08-01 LAB — RENAL FUNCTION PANEL
Albumin: 3.4 g/dL — ABNORMAL LOW (ref 3.5–5.0)
Albumin: 3.8 g/dL (ref 3.5–5.0)
Anion gap: 9 (ref 5–15)
Anion gap: 8 (ref 5–15)
BUN: 40 mg/dL — ABNORMAL HIGH (ref 6–20)
BUN: 38 mg/dL — ABNORMAL HIGH (ref 6–20)
CO2: 21 mmol/L — ABNORMAL LOW (ref 22–32)
CO2: 23 mmol/L (ref 22–32)
Calcium: 8.7 mg/dL — ABNORMAL LOW (ref 8.9–10.3)
Calcium: 9 mg/dL (ref 8.9–10.3)
Chloride: 104 mmol/L (ref 98–111)
Chloride: 106 mmol/L (ref 98–111)
Creatinine, Ser: 3.04 mg/dL — ABNORMAL HIGH (ref 0.44–1.00)
Creatinine, Ser: 2.05 mg/dL — ABNORMAL HIGH (ref 0.44–1.00)
GFR, Estimated: 17 mL/min — ABNORMAL LOW (ref >60)
GFR, Estimated: 28 mL/min — ABNORMAL LOW (ref >60)
Glucose, Bld: 171 mg/dL — ABNORMAL HIGH (ref 70–99)
Glucose, Bld: 144 mg/dL — ABNORMAL HIGH (ref 70–99)
Phosphorus: 4.3 mg/dL (ref 2.5–4.6)
Phosphorus: 4.1 mg/dL (ref 2.5–4.6)
Potassium: 4.9 mmol/L (ref 3.5–5.1)
Potassium: 4.8 mmol/L (ref 3.5–5.1)
Sodium: 134 mmol/L — ABNORMAL LOW (ref 135–145)
Sodium: 137 mmol/L (ref 135–145)

## 2021-08-01 LAB — PROTEIN / CREATININE RATIO, URINE
Creatinine, Urine: 99.17 mg/dL
Protein Creatinine Ratio: 0.36 mg/mg{Cre} — ABNORMAL HIGH (ref 0.00–0.15)
Total Protein, Urine: 36 mg/dL

## 2021-08-01 LAB — CREATININE, URINE, RANDOM: Creatinine, Urine: 97.15 mg/dL

## 2021-08-01 LAB — SODIUM, URINE, RANDOM: Sodium, Ur: 60 mmol/L

## 2021-08-01 MED ORDER — CHLORHEXIDINE GLUCONATE CLOTH 2 % EX PADS
6.0000 | MEDICATED_PAD | Freq: Every day | CUTANEOUS | Status: DC
  Administered 2021-08-01: 6 via TOPICAL

## 2021-08-01 NOTE — Progress Notes (Addendum)
Patient ID: Norma Colon, female   DOB: 07-Jun-1961, 60 y.o.   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  She tolerated the procedure well but has an indwelling foley catheter and double j stent in her right kidney/bladder.  She denies any N/V/flank pain. O:BP 136/85   Pulse 95   Temp 98 F (36.7 C) (Oral)   Resp 20   Ht 5' 8.5" (1.74 m)   Wt 98 kg   LMP  (LMP Unknown)   SpO2 95%   BMI 32.37 kg/m   Intake/Output Summary (Last 24 hours) at 08/01/2021 1101 Last data filed at 08/01/2021 0900 Gross per 24 hour  Intake 990 ml  Output 1450 ml  Net -460 ml   Intake/Output: I/O last 3 completed shifts: In: 1926.2 [P.O.:720; I.V.:1108.7; IV Piggyback:97.5] Out: 5974 [Urine:1650]  Intake/Output this shift:  Total I/O In: 480 [P.O.:480] Out: -  Weight change:  Gen:NAD CVS: RRR Resp: CTA Abd: +BS, soft, mildly tender to suprapubic area Ext: no edema  Recent Labs  Lab 07/30/21 0857 07/31/21 0528 08/01/21 0457 08/01/21 1058  NA  --  135 134* 137  K  --  5.1 4.9 4.8  CL  --  103 104 106  CO2  --  23 21* 23  GLUCOSE  --  146* 171* 144*  BUN  --  42* 40* 38*  CREATININE 3.00* 2.75* 3.04* 2.05*  ALBUMIN  --   --  3.4* 3.8  CALCIUM  --  8.7* 8.7* 9.0  PHOS  --   --  4.3 4.1   Liver Function Tests: Recent Labs  Lab 08/01/21 0457  ALBUMIN 3.4*   No results for input(s): LIPASE, AMYLASE in the last 168 hours. No results for input(s): AMMONIA in the last 168 hours. CBC: Recent Labs  Lab 07/31/21 0527 08/01/21 0457  WBC 12.5* 10.0  NEUTROABS 10.9* 9.4*  HGB 12.4 12.2  HCT 38.6 38.3  MCV 97.0 97.2  PLT 228 196   Cardiac Enzymes: No results for input(s): CKTOTAL, CKMB, CKMBINDEX, TROPONINI in the last 168 hours. CBG: No results for input(s): GLUCAP in the last 168 hours.  Iron Studies: No results for input(s):  IRON, TIBC, TRANSFERRIN, FERRITIN in the last 72 hours. Studies/Results: CT ABDOMEN PELVIS WO CONTRAST  Result Date: 07/31/2021 CLINICAL DATA:  Lower abdominal pain with acute renal failure. Suprapubic pain. 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 EXAM: CT ABDOMEN AND PELVIS WITHOUT CONTRAST TECHNIQUE: Multidetector CT imaging of the abdomen and pelvis was performed following the standard protocol without IV contrast. COMPARISON:  06/17/2021 FINDINGS: Lower chest: Basilar atelectasis bilaterally. Hepatobiliary: Ablation defect noted posterior right liver, more pronounced than on the prior study with posterior high attenuation material likely treatment related or small focus of hemorrhage. Similar appearance of reported ablation defect lateral segment left liver. The liver shows diffusely decreased attenuation suggesting fat deposition. Tiny hyperenhancing lesion medial segment left liver seen on the previous study is not evident on today's exam. High attenuation material in the gallbladder lumen is compatible with vicarious excretion of contrast material. Gallstones evident. No intrahepatic or extrahepatic biliary dilation. Pancreas: No focal mass lesion. No dilatation of the main duct. No intraparenchymal cyst. No peripancreatic edema. Spleen: No splenomegaly. No focal mass lesion. Adrenals/Urinary Tract: No adrenal nodule or mass. Contrast excretion of both kidneys compatible with contrast material from yesterday's CT scan. No evidence  for hydroureter. The urinary bladder appears normal for the degree of distention. Stomach/Bowel: Stomach is moderately distended with food and fluid. Duodenum is normally positioned as is the ligament of Treitz. No small bowel wall thickening. No small bowel dilatation. The terminal ileum is normal. The appendix is not well visualized, but there is no edema or inflammation in the region of the cecum. Prominent stool volume right and transverse colon. Suture line noted in  the rectum. Vascular/Lymphatic: No abdominal aortic aneurysm. No abdominal lymphadenopathy. No pelvic sidewall lymphadenopathy. Reproductive: No adnexal mass. Other: Since yesterday's study, there is moderate volume high attenuation free fluid in the abdomen and pelvis, seen around the liver and spleen, within the mesentery, and in the cul-de-sac. Attenuation of the fluid is higher than would be expected for serous fluid suggesting hemoperitoneum or opacified fluid. Of note, there is fairly high attenuation fluid in the left lateral pelvis adjacent to the acetabulum and in the posterior cul-de-sac to the right of the rectum. Musculoskeletal: No worrisome lytic or sclerotic osseous abnormality. L5-S1 spondylolisthesis is similar to prior. IMPRESSION: 1. Interval development of moderate volume high attenuation free fluid in the abdomen and pelvis. Attenuation of the fluid is higher than would be expected for serous fluid. I discussed this case with Dr. Laurence Ferrari at the time of interpretation. Of note, the patient developed progressive right-sided hydronephrosis and proximal hydroureter during the 1 hour ablation procedure yesterday, well demonstrated on localization scans performed on that study. On today's study, the right ureter can be seen to have an ectopic insertion high on the posterior dome of the bladder. The distal right ureter just proximal to the UVJ is irregular and there appears to be a string like subtle band of extravasated urine adjacent to the right UVJ. As such, these findings are concerning for distal right ureteral injury and ureteral leak as the etiology of the new intraperitoneal fluid. Given that the free fluid is intraperitoneal, this would suggest that the right UVJ is not retroperitoneal in location. However, the patient was already excreting contrast into the collecting systems and ureters by the end of yesterday's study and the intraperitoneal fluid today is not substantially opacified,  possibly secondary to some reabsorption of the contrast material via the peritoneum although a component of hemoperitoneum could also have this appearance and cannot be entirely excluded on this exam. 2. Posterior right hepatic lobe ablation defect is increased in size in the interval, consistent with retreatment. Similar appearance of ablation defect in the lateral segment left liver. 3. Cholelithiasis. 4. Prominent stool volume right and transverse colon. Imaging features could be compatible with clinical constipation. 5. L5-S1 spondylolisthesis, similar to prior. Electronically Signed   By: Misty Stanley M.D.   On: 07/31/2021 15:53   DG Retrograde Pyelogram  Result Date: 08/01/2021 CLINICAL DATA:  Distal right ureteral injury from traumatic Foley catheter insertion. EXAM: INTRAOPERATIVE RIGHT RETROGRADE UROGRAPHY TECHNIQUE: Images were obtained with the C-arm fluoroscopic device intraoperatively and submitted for interpretation post-operatively. Please see the procedural report for the amount of contrast and the fluoroscopy time utilized. COMPARISON:  CT of the abdomen and pelvis on 07/31/2021 FINDINGS: Intraoperative imaging demonstrates cannulation of the right ureter with contrast injection demonstrating probable distal ureteral injury. A guidewire was advanced to the level of the collecting system and a ureteral stent placed. IMPRESSION: Retrograde urography demonstrating distal right ureteral injury. A right ureteral stent was placed. Electronically Signed   By: Aletta Edouard M.D.   On: 08/01/2021 07:56   US RENAL  Result Date: 07/31/2021 CLINICAL DATA:  Right hydronephrosis EXAM: RENAL / URINARY TRACT ULTRASOUND COMPLETE COMPARISON:  None. FINDINGS: Right Kidney: Renal measurements: 9.8 x 5.6 x 6.0 cm = volume: 172 mL. Echogenicity within normal limits. No mass or hydronephrosis visualized. Left Kidney: Renal measurements: 9.9 x 5.0 x 4.7 cm = volume: 121 mL. Echogenicity within normal limits.  No mass or hydronephrosis visualized. Bladder: Appears normal for degree of bladder distention. Other: Small amount of free fluid seen in the left flank. IMPRESSION: No hydronephrosis. Small amount of free fluid seen in the left flank. Electronically Signed   By: Yetta Glassman M.D.   On: 07/31/2021 10:01   CT GUIDE TISSUE ABLATION  Result Date: 07/30/2021 INDICATION: 60 year old female with a history of rectal adenocarcinoma with hepatic metastatic disease. She has imaging and biochemical evidence of marginal recurrence at the previously treated ablation site in hepatic segment 6. She presents today for biopsy and repeat percutaneous thermal ablation. EXAM: 1. Ultrasound-guided biopsy hepatic lesion 2. CT and ultrasound-guided percutaneous microwave ablation COMPARISON:  CT abdomen/pelvis 06/17/2021 MEDICATIONS: None. ANESTHESIA/SEDATION: General - as administered by the Anesthesia department FLUOROSCOPY TIME:  None. COMPLICATIONS: None immediate. TECHNIQUE: Informed written consent was obtained from the patient after a thorough discussion of the procedural risks, benefits and alternatives. All questions were addressed. Maximal Sterile Barrier Technique was utilized including caps, mask, sterile gowns, sterile gloves, sterile drape, hand hygiene and skin antiseptic. A timeout was performed prior to the initiation of the procedure. The patient was placed on the CT gantry. Initial nonenhanced CT imaging was performed. The prior ablation defect is visible. Unfortunately, the liver lesion is essentially occult by noncontrast enhanced CT imaging. Therefore, additional CT imaging was performed with intravenous contrast. This confirms the presence of the mass which has enlarged compared to the relatively recent prior imaging. The mass now measures approximately 3 x 3 x 3 cm. The right upper quadrant was also interrogated with ultrasound. The mass is readily visible by ultrasound. First, biopsy was performed. A  suitable skin entry site was selected and marked. A small dermatotomy was made. Under real-time ultrasound guidance, an 18 gauge trocar needle was advanced into the margin the mass. Multiple 18 gauge core biopsies were then obtained coaxially using a bio Pince automated biopsy device. Biopsy specimens were placed in formalin and delivered to pathology for further analysis. Next, 2 NeuWave PR XT probes were selected. The probes were then advanced and positioned within the cephalad and more caudal aspects of the lesion in parallel separated by approximately 1.5 cm. Following adequate placement of the probes, thermal ablation was then performed. Both probes were powered at 65 watts in the ablation was continued for approximately 6 minutes. The ablation zone was monitored both by ultrasound and also CT imaging at approximately 5 minutes. The ablation zone appears to encompass the entirety of the tumor. Following successful ablation, the probes were removed. Repeat CT imaging was then performed with and without intravenous contrast. Adequate ablation of the region of tumor. No evidence of immediate complication. FINDINGS: Successful percutaneous tumor ablation without evidence of complication. IMPRESSION: 1. Ultrasound-guided core biopsy of presumed recurrent liver metastasis. 2. Successful percutaneous thermal ablation. Electronically Signed   By: Jacqulynn Cadet M.D.   On: 07/30/2021 12:10   CT BIOPSY  Result Date: 07/30/2021 INDICATION: 60 year old female with a history of rectal adenocarcinoma with hepatic metastatic disease. She has imaging and biochemical evidence of marginal recurrence at the previously treated ablation site in hepatic segment 6. She presents  today for biopsy and repeat percutaneous thermal ablation. EXAM: 1. Ultrasound-guided biopsy hepatic lesion 2. CT and ultrasound-guided percutaneous microwave ablation COMPARISON:  CT abdomen/pelvis 06/17/2021 MEDICATIONS: None. ANESTHESIA/SEDATION:  General - as administered by the Anesthesia department FLUOROSCOPY TIME:  None. COMPLICATIONS: None immediate. TECHNIQUE: Informed written consent was obtained from the patient after a thorough discussion of the procedural risks, benefits and alternatives. All questions were addressed. Maximal Sterile Barrier Technique was utilized including caps, mask, sterile gowns, sterile gloves, sterile drape, hand hygiene and skin antiseptic. A timeout was performed prior to the initiation of the procedure. The patient was placed on the CT gantry. Initial nonenhanced CT imaging was performed. The prior ablation defect is visible. Unfortunately, the liver lesion is essentially occult by noncontrast enhanced CT imaging. Therefore, additional CT imaging was performed with intravenous contrast. This confirms the presence of the mass which has enlarged compared to the relatively recent prior imaging. The mass now measures approximately 3 x 3 x 3 cm. The right upper quadrant was also interrogated with ultrasound. The mass is readily visible by ultrasound. First, biopsy was performed. A suitable skin entry site was selected and marked. A small dermatotomy was made. Under real-time ultrasound guidance, an 18 gauge trocar needle was advanced into the margin the mass. Multiple 18 gauge core biopsies were then obtained coaxially using a bio Pince automated biopsy device. Biopsy specimens were placed in formalin and delivered to pathology for further analysis. Next, 2 NeuWave PR XT probes were selected. The probes were then advanced and positioned within the cephalad and more caudal aspects of the lesion in parallel separated by approximately 1.5 cm. Following adequate placement of the probes, thermal ablation was then performed. Both probes were powered at 65 watts in the ablation was continued for approximately 6 minutes. The ablation zone was monitored both by ultrasound and also CT imaging at approximately 5 minutes. The ablation zone  appears to encompass the entirety of the tumor. Following successful ablation, the probes were removed. Repeat CT imaging was then performed with and without intravenous contrast. Adequate ablation of the region of tumor. No evidence of immediate complication. FINDINGS: Successful percutaneous tumor ablation without evidence of complication. IMPRESSION: 1. Ultrasound-guided core biopsy of presumed recurrent liver metastasis. 2. Successful percutaneous thermal ablation. Electronically Signed   By: Jacqulynn Cadet M.D.   On: 07/30/2021 12:10    alprazolam  2 mg Oral q morning   aspirin EC  81 mg Oral q morning   buPROPion  300 mg Oral q morning   Chlorhexidine Gluconate Cloth  6 each Topical Daily   cyclobenzaprine  20 mg Oral QHS   docusate sodium  100 mg Oral BID   HYDROmorphone       lubiprostone  24 mcg Oral BID WC   pantoprazole  40 mg Oral Daily   psyllium  1 packet Oral Daily   rOPINIRole  1 mg Oral QHS    BMET    Component Value Date/Time   NA 137 08/01/2021 1058   K 4.8 08/01/2021 1058   CL 106 08/01/2021 1058   CO2 23 08/01/2021 1058   GLUCOSE 144 (H) 08/01/2021 1058   BUN 38 (H) 08/01/2021 1058   CREATININE 2.05 (H) 08/01/2021 1058   CREATININE 0.90 06/17/2021 1238   CALCIUM 9.0 08/01/2021 1058   GFRNONAA 28 (L) 08/01/2021 1058   GFRNONAA >60 06/17/2021 1238   GFRAA >60 06/17/2020 1513   GFRAA 58 (L) 04/11/2020 1102   CBC    Component Value  Date/Time   WBC 10.0 08/01/2021 0457   RBC 3.94 08/01/2021 0457   HGB 12.2 08/01/2021 0457   HGB 14.0 04/15/2021 1531   HCT 38.3 08/01/2021 0457   PLT 196 08/01/2021 0457   PLT 234 04/15/2021 1531   MCV 97.2 08/01/2021 0457   MCH 31.0 08/01/2021 0457   MCHC 31.9 08/01/2021 0457   RDW 13.6 08/01/2021 0457   LYMPHSABS 0.4 (L) 08/01/2021 0457   MONOABS 0.2 08/01/2021 0457   EOSABS 0.0 08/01/2021 0457   BASOSABS 0.0 08/01/2021 0457    Assessment/Plan:  AKI - unclear etiology and appears to have happened within the last 2  weeks given normal Scr of 0.84 on 07/15/21 and predated the ablation.  Unable to identify any nephrotoxic agents (although she takes a lot of supplements) and renal US without evidence of obstruction, however CT microwave ablation did show right hydronephrosis.  Urine studies ordered yesterday but have not been sent for unclear reasons.  Scr was still elevated this morning and will recheck now.  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 Abner Greenspan.  Marked UOP since foley placed.  Scr improved to 2.05 later today. [place] for discharge from renal standpoint and will f/u in our office in 2 weeks but hopefully will normalize with time.  No indication for dialysis at this time. Right distal ureter injury with extravasation of urine s/p cysto and stent placement and foley catheter insertion.  Right pyelogram showed extravasation of contrast medially.  Marked improvement of UOP and RUQ pain. Suprapubic pain and gross hematuria - due to traumatic foley removal.  Await CT scan findings.  Rectal cancer with mets to liver - s/p microwave ablation 07/30/21. Hyperkalemia - due to #1.  Improved with lokelma and foley catheter placement.  [place]ue to follow.  Donetta Potts, MD [employer] [phone]

## 2021-08-01 NOTE — Progress Notes (Signed)
This patient is receiving excellent care from Urology, Nephrology and IR. TRH will sign off ad tere are no medical issues  Norma Odea, MD

## 2021-08-01 NOTE — Progress Notes (Signed)
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 She currently denies fever,CP, worsening dyspnea, N/V or bleeding; she does have mild HA, some mild RUQ soreness and some persistent suprapubic discomfort; she has not has a BM yet; foley draining yellow urine   Allergies: Prednisone and Codeine  Medications: Prior to Admission medications   Medication Sig Start Date End Date Taking? Authorizing Provider  alprazolam Duanne Moron) 2 MG tablet Take 2 mg by mouth every morning.    Yes [provider]  Ascorbic Acid (VITAMIN C) 1000 MG tablet Take 1,000 mg by mouth daily.   Yes [provider]  aspirin EC 81 MG tablet Take 81 mg by mouth every morning. Swallow whole.    Yes [provider]  Biotin 10 MG CAPS Take 10 mg by mouth daily.   Yes [provider]  buPROPion (WELLBUTRIN XL) 300 MG 24 hr tablet Take 300 mg by mouth every morning.    Yes [provider]  calcium carbonate (OSCAL) 1500 (600 Ca) MG TABS tablet Take 600 mg of elemental calcium by mouth daily.   Yes [provider]  Cholecalciferol (DIALYVITE VITAMIN D 5000) 125 MCG (5000 UT) capsule Take 5,000 Units by mouth daily.   Yes [provider]  cyclobenzaprine (FLEXERIL) 10 MG tablet Take 20 mg by mouth at bedtime.   Yes [provider]  ECHINACEA-ZINC-VITAMIN C PO Take 1 tablet by mouth daily.   Yes [provider]  HYDROcodone-acetaminophen (NORCO) 10-325 MG tablet Take 1-2 tablets by mouth every 6 (six) hours as needed for moderate  pain or severe pain (headache). Post-Operatively. 08/05/19  Yes Alexis Frock, MD  lidocaine-prilocaine (EMLA) cream Apply 1 application topically as directed. Apply 1-2 hours prior to IV stick and cover with plastic wrap 11/28/20  Yes Ladell Pier, MD  METAMUCIL FIBER PO Take 1 capsule by mouth daily.   Yes [provider]  Methylcellulose, Laxative, (CITRUCEL PO) Take 1 tablet by mouth daily.   Yes [provider]  Multiple Vitamin (MULTIVITAMIN WITH MINERALS) TABS tablet Take 1 tablet by mouth daily.   Yes [provider]  Multiple Vitamins-Minerals (IMMUNE SUPPORT PO) Take 1 tablet by mouth daily.   Yes [provider]  Omega-3 Fatty Acids (FISH OIL) 1000 MG CAPS Take 1,000 mg by mouth daily.   Yes [provider]  Prenatal Vit-Fe Fumarate-FA (PRENATAL PO) Take 1 tablet by mouth daily.   Yes [provider]  Probiotic Product (PROBIOTIC PO) Take 1 tablet by mouth every morning. Acidophilus 100 million   Yes [provider]  rOPINIRole (REQUIP) 1 MG tablet Take 1 mg by mouth at bedtime.   Yes [provider]  TURMERIC PO Take 1 tablet by mouth daily.   Yes [provider]  vitamin B-12 (CYANOCOBALAMIN) 1000 MCG tablet Take 1,000 mcg by mouth daily.   Yes [provider]  diphenoxylate-atropine (LOMOTIL) 2.5-0.025 MG tablet TAKE 1 TO 2 TABLETS BY MOUTH FOUR TIMES DAILY AS NEEDED FOR DIARRHEA OR LOOSE STOOLS Patient not taking: Reported on 07/31/2021 03/10/21   Owens Shark, NP  lubiprostone (AMITIZA) 24 MCG capsule Take  1 capsule (24 mcg total) by mouth 2 (two) times daily with a meal. 07/16/21 01/07/23  Cirigliano, Vito V, DO  Na Sulfate-K Sulfate-Mg Sulf 17.5-3.13-1.6 GM/177ML SOLN Take 1 kit by mouth as directed. May use generic Suprep 07/31/21   Cirigliano, Vito V, DO     Vital Signs: BP 136/85   Pulse 95   Temp 98 F (36.7 C) (Oral)   Resp 20   Ht 5' 8.5" (1.74 m)   Wt 216 lb 0.8 oz (98 kg)    LMP  (LMP Unknown)   SpO2 95%   BMI 32.37 kg/m   Physical Exam awake/alert; RUQ access site from bx/ablation sl ecchymotic, mildly tender, no active bleeding noted; mildly tender suprapubic region, foley cath in place draining yellow urine  Imaging: CT ABDOMEN PELVIS WO CONTRAST  Result Date: 07/31/2021 CLINICAL DATA:  Lower abdominal pain with acute renal failure. Suprapubic pain. 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 EXAM: CT ABDOMEN AND PELVIS WITHOUT CONTRAST TECHNIQUE: Multidetector CT imaging of the abdomen and pelvis was performed following the standard protocol without IV contrast. COMPARISON:  06/17/2021 FINDINGS: Lower chest: Basilar atelectasis bilaterally. Hepatobiliary: Ablation defect noted posterior right liver, more pronounced than on the prior study with posterior high attenuation material likely treatment related or small focus of hemorrhage. Similar appearance of reported ablation defect lateral segment left liver. The liver shows diffusely decreased attenuation suggesting fat deposition. Tiny hyperenhancing lesion medial segment left liver seen on the previous study is not evident on today's exam. High attenuation material in the gallbladder lumen is compatible with vicarious excretion of contrast material. Gallstones evident. No intrahepatic or extrahepatic biliary dilation. Pancreas: No focal mass lesion. No dilatation of the main duct. No intraparenchymal cyst. No peripancreatic edema. Spleen: No splenomegaly. No focal mass lesion. Adrenals/Urinary Tract: No adrenal nodule or mass. Contrast excretion of both kidneys compatible with contrast material from yesterday's CT scan. No evidence for hydroureter. The urinary bladder appears normal for the degree of distention. Stomach/Bowel: Stomach is moderately distended with food and fluid. Duodenum is normally positioned as is the ligament of Treitz. No small bowel wall thickening. No small bowel dilatation. The terminal ileum  is normal. The appendix is not well visualized, but there is no edema or inflammation in the region of the cecum. Prominent stool volume right and transverse colon. Suture line noted in the rectum. Vascular/Lymphatic: No abdominal aortic aneurysm. No abdominal lymphadenopathy. No pelvic sidewall lymphadenopathy. Reproductive: No adnexal mass. Other: Since yesterday's study, there is moderate volume high attenuation free fluid in the abdomen and pelvis, seen around the liver and spleen, within the mesentery, and in the cul-de-sac. Attenuation of the fluid is higher than would be expected for serous fluid suggesting hemoperitoneum or opacified fluid. Of note, there is fairly high attenuation fluid in the left lateral pelvis adjacent to the acetabulum and in the posterior cul-de-sac to the right of the rectum. Musculoskeletal: No worrisome lytic or sclerotic osseous abnormality. L5-S1 spondylolisthesis is similar to prior. IMPRESSION: 1. Interval development of moderate volume high attenuation free fluid in the abdomen and pelvis. Attenuation of the fluid is higher than would be expected for serous fluid. I discussed this case with Dr. Laurence Ferrari at the time of interpretation. Of note, the patient developed progressive right-sided hydronephrosis and proximal hydroureter during the 1 hour ablation procedure yesterday, well demonstrated on localization scans performed on that study. On today's study, the right ureter can be seen to have an ectopic insertion high on the posterior dome  of the bladder. The distal right ureter just proximal to the UVJ is irregular and there appears to be a string like subtle band of extravasated urine adjacent to the right UVJ. As such, these findings are concerning for distal right ureteral injury and ureteral leak as the etiology of the new intraperitoneal fluid. Given that the free fluid is intraperitoneal, this would suggest that the right UVJ is not retroperitoneal in location. However,  the patient was already excreting contrast into the collecting systems and ureters by the end of yesterday's study and the intraperitoneal fluid today is not substantially opacified, possibly secondary to some reabsorption of the contrast material via the peritoneum although a component of hemoperitoneum could also have this appearance and cannot be entirely excluded on this exam. 2. Posterior right hepatic lobe ablation defect is increased in size in the interval, consistent with retreatment. Similar appearance of ablation defect in the lateral segment left liver. 3. Cholelithiasis. 4. Prominent stool volume right and transverse colon. Imaging features could be compatible with clinical constipation. 5. L5-S1 spondylolisthesis, similar to prior. Electronically Signed   By: Misty Stanley M.D.   On: 07/31/2021 15:53   DG Retrograde Pyelogram  Result Date: 08/01/2021 CLINICAL DATA:  Distal right ureteral injury from traumatic Foley catheter insertion. EXAM: INTRAOPERATIVE RIGHT RETROGRADE UROGRAPHY TECHNIQUE: Images were obtained with the C-arm fluoroscopic device intraoperatively and submitted for interpretation post-operatively. Please see the procedural report for the amount of contrast and the fluoroscopy time utilized. COMPARISON:  CT of the abdomen and pelvis on 07/31/2021 FINDINGS: Intraoperative imaging demonstrates cannulation of the right ureter with contrast injection demonstrating probable distal ureteral injury. A guidewire was advanced to the level of the collecting system and a ureteral stent placed. IMPRESSION: Retrograde urography demonstrating distal right ureteral injury. A right ureteral stent was placed. Electronically Signed   By: Aletta Edouard M.D.   On: 08/01/2021 07:56   US RENAL  Result Date: 07/31/2021 CLINICAL DATA:  Right hydronephrosis EXAM: RENAL / URINARY TRACT ULTRASOUND COMPLETE COMPARISON:  None. FINDINGS: Right Kidney: Renal measurements: 9.8 x 5.6 x 6.0 cm = volume: 172  mL. Echogenicity within normal limits. No mass or hydronephrosis visualized. Left Kidney: Renal measurements: 9.9 x 5.0 x 4.7 cm = volume: 121 mL. Echogenicity within normal limits. No mass or hydronephrosis visualized. Bladder: Appears normal for degree of bladder distention. Other: Small amount of free fluid seen in the left flank. IMPRESSION: No hydronephrosis. Small amount of free fluid seen in the left flank. Electronically Signed   By: Yetta Glassman M.D.   On: 07/31/2021 10:01   CT GUIDE TISSUE ABLATION  Result Date: 07/30/2021 INDICATION: 60 year old female with a history of rectal adenocarcinoma with hepatic metastatic disease. She has imaging and biochemical evidence of marginal recurrence at the previously treated ablation site in hepatic segment 6. She presents today for biopsy and repeat percutaneous thermal ablation. EXAM: 1. Ultrasound-guided biopsy hepatic lesion 2. CT and ultrasound-guided percutaneous microwave ablation COMPARISON:  CT abdomen/pelvis 06/17/2021 MEDICATIONS: None. ANESTHESIA/SEDATION: General - as administered by the Anesthesia department FLUOROSCOPY TIME:  None. COMPLICATIONS: None immediate. TECHNIQUE: Informed written consent was obtained from the patient after a thorough discussion of the procedural risks, benefits and alternatives. All questions were addressed. Maximal Sterile Barrier Technique was utilized including caps, mask, sterile gowns, sterile gloves, sterile drape, hand hygiene and skin antiseptic. A timeout was performed prior to the initiation of the procedure. The patient was placed on the CT gantry. Initial nonenhanced CT imaging was performed. The  prior ablation defect is visible. Unfortunately, the liver lesion is essentially occult by noncontrast enhanced CT imaging. Therefore, additional CT imaging was performed with intravenous contrast. This confirms the presence of the mass which has enlarged compared to the relatively recent prior imaging. The mass  now measures approximately 3 x 3 x 3 cm. The right upper quadrant was also interrogated with ultrasound. The mass is readily visible by ultrasound. First, biopsy was performed. A suitable skin entry site was selected and marked. A small dermatotomy was made. Under real-time ultrasound guidance, an 18 gauge trocar needle was advanced into the margin the mass. Multiple 18 gauge core biopsies were then obtained coaxially using a bio Pince automated biopsy device. Biopsy specimens were placed in formalin and delivered to pathology for further analysis. Next, 2 NeuWave PR XT probes were selected. The probes were then advanced and positioned within the cephalad and more caudal aspects of the lesion in parallel separated by approximately 1.5 cm. Following adequate placement of the probes, thermal ablation was then performed. Both probes were powered at 65 watts in the ablation was continued for approximately 6 minutes. The ablation zone was monitored both by ultrasound and also CT imaging at approximately 5 minutes. The ablation zone appears to encompass the entirety of the tumor. Following successful ablation, the probes were removed. Repeat CT imaging was then performed with and without intravenous contrast. Adequate ablation of the region of tumor. No evidence of immediate complication. FINDINGS: Successful percutaneous tumor ablation without evidence of complication. IMPRESSION: 1. Ultrasound-guided core biopsy of presumed recurrent liver metastasis. 2. Successful percutaneous thermal ablation. Electronically Signed   By: Jacqulynn Cadet M.D.   On: 07/30/2021 12:10   CT BIOPSY  Result Date: 07/30/2021 INDICATION: 60 year old female with a history of rectal adenocarcinoma with hepatic metastatic disease. She has imaging and biochemical evidence of marginal recurrence at the previously treated ablation site in hepatic segment 6. She presents today for biopsy and repeat percutaneous thermal ablation. EXAM: 1.  Ultrasound-guided biopsy hepatic lesion 2. CT and ultrasound-guided percutaneous microwave ablation COMPARISON:  CT abdomen/pelvis 06/17/2021 MEDICATIONS: None. ANESTHESIA/SEDATION: General - as administered by the Anesthesia department FLUOROSCOPY TIME:  None. COMPLICATIONS: None immediate. TECHNIQUE: Informed written consent was obtained from the patient after a thorough discussion of the procedural risks, benefits and alternatives. All questions were addressed. Maximal Sterile Barrier Technique was utilized including caps, mask, sterile gowns, sterile gloves, sterile drape, hand hygiene and skin antiseptic. A timeout was performed prior to the initiation of the procedure. The patient was placed on the CT gantry. Initial nonenhanced CT imaging was performed. The prior ablation defect is visible. Unfortunately, the liver lesion is essentially occult by noncontrast enhanced CT imaging. Therefore, additional CT imaging was performed with intravenous contrast. This confirms the presence of the mass which has enlarged compared to the relatively recent prior imaging. The mass now measures approximately 3 x 3 x 3 cm. The right upper quadrant was also interrogated with ultrasound. The mass is readily visible by ultrasound. First, biopsy was performed. A suitable skin entry site was selected and marked. A small dermatotomy was made. Under real-time ultrasound guidance, an 18 gauge trocar needle was advanced into the margin the mass. Multiple 18 gauge core biopsies were then obtained coaxially using a bio Pince automated biopsy device. Biopsy specimens were placed in formalin and delivered to pathology for further analysis. Next, 2 NeuWave PR XT probes were selected. The probes were then advanced and positioned within the cephalad and more caudal aspects  of the lesion in parallel separated by approximately 1.5 cm. Following adequate placement of the probes, thermal ablation was then performed. Both probes were powered at 65  watts in the ablation was continued for approximately 6 minutes. The ablation zone was monitored both by ultrasound and also CT imaging at approximately 5 minutes. The ablation zone appears to encompass the entirety of the tumor. Following successful ablation, the probes were removed. Repeat CT imaging was then performed with and without intravenous contrast. Adequate ablation of the region of tumor. No evidence of immediate complication. FINDINGS: Successful percutaneous tumor ablation without evidence of complication. IMPRESSION: 1. Ultrasound-guided core biopsy of presumed recurrent liver metastasis. 2. Successful percutaneous thermal ablation. Electronically Signed   By: Jacqulynn Cadet M.D.   On: 07/30/2021 12:10    Labs:  CBC: Recent Labs    04/15/21 1531 07/15/21 1342 07/31/21 0527 08/01/21 0457  WBC 6.5 5.6 12.5* 10.0  HGB 14.0 13.6 12.4 12.2  HCT 43.6 42.1 38.6 38.3  PLT 234 257 228 196    COAGS: Recent Labs    07/15/21 1342  INR 1.1    BMP: Recent Labs    06/17/21 1238 07/15/21 1342 07/30/21 0857 07/31/21 0528 08/01/21 0457  NA 139 139  --  135 134*  K 4.1 4.1  --  5.1 4.9  CL 102 103  --  103 104  CO2 29 30  --  23 21*  GLUCOSE 98 80  --  146* 171*  BUN 17 19  --  42* 40*  CALCIUM 9.9 9.7  --  8.7* 8.7*  CREATININE 0.90 0.84 3.00* 2.75* 3.04*  GFRNONAA >60 >60  --  20* 17*    LIVER FUNCTION TESTS: Recent Labs    10/30/20 1054 11/14/20 0908 11/28/20 0812 07/15/21 1342 08/01/21 0457  BILITOT 0.5 0.4 0.5 0.6  --   AST 38 37 39 35  --   ALT 51* 54* 57* 39  --   ALKPHOS 135* 138* 133* 97  --   PROT 7.4 6.9 7.0 7.7  --   ALBUMIN 3.7 3.6 3.6 3.9 3.4*    Assessment and Plan: Patient with history of rectal adenocarcinoma with metastatic disease to the liver and imaging and biochemical evidence of marginal recurrence at previously treated ablation site in hepatic segment 6; she underwent image guided liver lesion biopsy and repeat percutaneous thermal  ablation 10/26 by Dr. 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 Laurence Ferrari in 3-4 weeks; AKI being followed by nephrology/urology/[facility]- defer to their services for further medical management.  Electronically Signed: D. Rowe Robert, PA-C 08/01/2021, 10:47 AM   I spent a total of 15 minutes at the the patient's bedside AND on the patient's hospital floor or unit, greater than 50% of which was counseling/coordinating care for liver lesion biopsy/microwave ablation    Patient ID: Norma Colon, female   DOB: 1961-06-14, 60 y.o.   MRN: [id number]

## 2021-08-02 LAB — CBC WITH DIFFERENTIAL/PLATELET
Abs Immature Granulocytes: 0.08 10*3/uL — ABNORMAL HIGH (ref 0.00–0.07)
Basophils Absolute: 0 10*3/uL (ref 0.0–0.1)
Basophils Relative: 0 %
Eosinophils Absolute: 0.1 10*3/uL (ref 0.0–0.5)
Eosinophils Relative: 2 %
HCT: 33.1 % — ABNORMAL LOW (ref 36.0–46.0)
Hemoglobin: 10.7 g/dL — ABNORMAL LOW (ref 12.0–15.0)
Immature Granulocytes: 1 %
Lymphocytes Relative: 19 %
Lymphs Abs: 1.4 10*3/uL (ref 0.7–4.0)
MCH: 31.2 pg (ref 26.0–34.0)
MCHC: 32.3 g/dL (ref 30.0–36.0)
MCV: 96.5 fL (ref 80.0–100.0)
Monocytes Absolute: 0.7 10*3/uL (ref 0.1–1.0)
Monocytes Relative: 10 %
Neutro Abs: 4.9 10*3/uL (ref 1.7–7.7)
Neutrophils Relative %: 68 %
Platelets: 182 10*3/uL (ref 150–400)
RBC: 3.43 MIL/uL — ABNORMAL LOW (ref 3.87–5.11)
RDW: 13.9 % (ref 11.5–15.5)
WBC: 7.2 10*3/uL (ref 4.0–10.5)
nRBC: 0 % (ref 0.0–0.2)

## 2021-08-02 LAB — RENAL FUNCTION PANEL
Albumin: 3.1 g/dL — ABNORMAL LOW (ref 3.5–5.0)
Anion gap: 3 — ABNORMAL LOW (ref 5–15)
BUN: 27 mg/dL — ABNORMAL HIGH (ref 6–20)
CO2: 26 mmol/L (ref 22–32)
Calcium: 8.5 mg/dL — ABNORMAL LOW (ref 8.9–10.3)
Chloride: 110 mmol/L (ref 98–111)
Creatinine, Ser: 1 mg/dL (ref 0.44–1.00)
GFR, Estimated: >60 mL/min (ref >60)
Glucose, Bld: 122 mg/dL — ABNORMAL HIGH (ref 70–99)
Phosphorus: 2.8 mg/dL (ref 2.5–4.6)
Potassium: 3.8 mmol/L (ref 3.5–5.1)
Sodium: 139 mmol/L (ref 135–145)

## 2021-08-02 MED ORDER — OXYCODONE-ACETAMINOPHEN 5-325 MG PO TABS
1.0000 | ORAL_TABLET | Freq: Four times a day (QID) | ORAL | Status: DC | PRN
  Administered 2021-08-02: 1 via ORAL
  Administered 2021-08-02: 2 via ORAL
  Administered 2021-08-03: 1 via ORAL
  Administered 2021-08-03: 2 via ORAL
  Filled 2021-08-02: qty 2
  Filled 2021-08-02: qty 1
  Filled 2021-08-02: qty 2
  Filled 2021-08-02 (×3): qty 1

## 2021-08-02 MED ORDER — SIMETHICONE 80 MG PO CHEW
80.0000 mg | CHEWABLE_TABLET | Freq: Four times a day (QID) | ORAL | Status: DC
  Administered 2021-08-02 – 2021-08-03 (×2): 80 mg via ORAL
  Filled 2021-08-02 (×2): qty 1

## 2021-08-02 NOTE — Progress Notes (Signed)
Looks good. Eating breakfast.  Vitals:   08/01/21 2213 08/02/21 0532  BP: 139/64 129/75  Pulse: 90 84  Resp: 18 18  Temp: 98.7 F (37.1 C) 98.3 F (36.8 C)  SpO2: 98% 95%    Intake/Output Summary (Last 24 hours) at 08/02/2021 5520 Last data filed at 08/02/2021 8022 Gross per 24 hour  Intake 3177.74 ml  Output 2850 ml  Net 327.74 ml   NAD Watching TV Urine clear - light rose   A/P: Right distal ureteral injury: Presumed to be due to inadvertent placement of Foley catheter into her ectopic reimplanted ureter and subsequently balloon dilated with a Foley catheter balloon.  CT A/P 07/31/2021 with extravasation of urine adjacent to the right distal ureter and free fluid in the pelvis. S/p cysto, R RPG confirming right distal ureteral injury, right stent placement 07/31/2021 Psuedo-azotemia due to intraperitoneal urine leakage: resolved. Since stent / foley Cr down to 1 and UOP excellent.  Metastatic rectal cancer s/p low anterior resection in 09/2018 followed by adjuvant chemotherapy and radiation and undergoing microwave ablations to liver mets   -Will plan to leave stent in place for 6 weeks with likely plan for cysto, R RPG prior to removal. -Leave foley catheter to gravity drainage for now. Ideally this will be left in place for several weeks to allow injury to heal without reflux of urine around the stent. Discussed with patient who understands. She would like to followup with Dr. Alyson Ingles. Dr. Abner Greenspan and Dr. Crissie Sickles will coordinate.

## 2021-08-02 NOTE — Progress Notes (Signed)
Patient c/o pain and both IV sites however she does not want a new IV access as she plans to be discharged today. IV fluids were stopped. Waiting for further instruction from MD.

## 2021-08-02 NOTE — Progress Notes (Signed)
Patient felt she was not ready to discharge today. She requested IV pain medication and IV fluids to be resumed. IV attempt was unsuccessful and IV team was consulted. IV RN discussed plan of care with Monia Sabal, PA, and now thw plan is for patient to try PO pain medication and stop IV fluids. The PA is ok for patient to go without IV access at this time.

## 2021-08-02 NOTE — Progress Notes (Signed)
Patient refused to access [facility] because she thought nurses don't know how to care of PAC. Patient's RN stated that patient need PIV access due to IV fluid and IV pain medication. This patient was suppose to be discharged today, but patient refused to d/c home due to no pain control. Radiology team PA Pam talked to patient, no need to have IV pain medication or IV fluid and changed PO pain medication, patient might d/c home tomorrow. Patient understood it well and No PIV access at this time. Informed patient's RN regarding this. [employer]

## 2021-08-02 NOTE — Progress Notes (Signed)
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 She currently denies fever,CP, worsening dyspnea, N/V or bleeding; she does have mild HA and some persistent suprapubic discomfort;  foley draining yellow urine.  Was planned for discharge to home today But pt feels uncomfortable with DC yet today. She is home alone with pets that need in and out. She is painful in low abdomen----"needs better pain management"  Discussed with Dr Laurence Ferrari--- will change Norco to Percocet every 6 hrs Pt takes hydrocodone at home for headaches-- without reaction She has no IV access at this time.-- will DC dilaudid   Allergies: Prednisone and Codeine  Medications: Prior to Admission medications   Medication Sig Start Date End Date Taking? Authorizing Provider  alprazolam Duanne Moron) 2 MG tablet Take 2 mg by mouth every morning.    Yes [provider]  Ascorbic Acid (VITAMIN C) 1000 MG tablet Take 1,000 mg by mouth daily.   Yes [provider]  aspirin EC 81 MG tablet Take 81 mg by mouth every morning. Swallow whole.    Yes [provider]  Biotin 10 MG CAPS Take 10 mg by mouth daily.   Yes [provider]  buPROPion (WELLBUTRIN XL) 300 MG 24 hr tablet Take 300 mg by mouth every morning.    Yes [provider]  calcium carbonate (OSCAL) 1500 (600 Ca) MG TABS tablet Take 600 mg of elemental calcium by mouth daily.   Yes [provider]  Cholecalciferol (DIALYVITE VITAMIN D 5000) 125 MCG (5000 UT) capsule Take 5,000 Units by mouth  daily.   Yes [provider]  cyclobenzaprine (FLEXERIL) 10 MG tablet Take 20 mg by mouth at bedtime.   Yes [provider]  ECHINACEA-ZINC-VITAMIN C PO Take 1 tablet by mouth daily.   Yes [provider]  HYDROcodone-acetaminophen (NORCO) 10-325 MG tablet Take 1-2 tablets by mouth every 6 (six) hours as needed for moderate pain or severe pain (headache). Post-Operatively. 08/05/19  Yes Alexis Frock, MD  lidocaine-prilocaine (EMLA) cream Apply 1 application topically as directed. Apply 1-2 hours prior to IV stick and cover with plastic wrap 11/28/20  Yes Ladell Pier, MD  METAMUCIL FIBER PO Take 1 capsule by mouth daily.   Yes [provider]  Methylcellulose, Laxative, (CITRUCEL PO) Take 1 tablet by mouth daily.   Yes [provider]  Multiple Vitamin (MULTIVITAMIN WITH MINERALS) TABS tablet Take 1 tablet by mouth daily.   Yes [provider]  Multiple Vitamins-Minerals (IMMUNE SUPPORT PO) Take 1 tablet by mouth daily.   Yes [provider]  Omega-3 Fatty Acids (FISH OIL) 1000 MG CAPS Take 1,000 mg by mouth daily.   Yes [provider]  Prenatal Vit-Fe Fumarate-FA (PRENATAL PO) Take 1 tablet by mouth daily.   Yes [provider]  Probiotic Product (PROBIOTIC PO) Take 1 tablet by mouth every morning. Acidophilus 100 million   Yes [provider]  rOPINIRole (REQUIP) 1 MG tablet Take 1 mg by mouth at bedtime.   Yes [provider]  TURMERIC PO Take 1 tablet by mouth daily.   Yes [provider]  vitamin  B-12 (CYANOCOBALAMIN) 1000 MCG tablet Take 1,000 mcg by mouth daily.   Yes [provider]  diphenoxylate-atropine (LOMOTIL) 2.5-0.025 MG tablet TAKE 1 TO 2 TABLETS BY MOUTH FOUR TIMES DAILY AS NEEDED FOR DIARRHEA OR LOOSE STOOLS Patient not taking: Reported on 07/31/2021 03/10/21   Owens Shark, NP  lubiprostone (AMITIZA) 24 MCG capsule Take 1 capsule (24 mcg total) by mouth 2  (two) times daily with a meal. 07/16/21 01/07/23  Cirigliano, Vito V, DO  Na Sulfate-K Sulfate-Mg Sulf 17.5-3.13-1.6 GM/177ML SOLN Take 1 kit by mouth as directed. May use generic Suprep 07/31/21   Cirigliano, Luanna Salk V, DO     Vital Signs: BP 129/75 (BP Location: Right Arm)   Pulse 84   Temp 98.3 F (36.8 C) (Oral)   Resp 18   Ht 5' 8.5" (1.74 m)   Wt 216 lb 0.8 oz (98 kg)   LMP  (LMP Unknown)   SpO2 95%   BMI 32.37 kg/m   Physical Exam Vitals reviewed.  Cardiovascular:     Rate and Rhythm: Normal rate and regular rhythm.  Pulmonary:     Effort: Pulmonary effort is normal.  Abdominal:     Palpations: Abdomen is soft.     Tenderness: There is abdominal tenderness.     Comments: Low abd pain; diffuse  Skin:    General: Skin is warm.     Comments: Sites are clean and dry Foley drain - with dark yellow urine  Neurological:     Mental Status: She is alert and oriented to person, place, and time.  Psychiatric:        Behavior: Behavior normal.    Imaging: CT ABDOMEN PELVIS WO CONTRAST  Result Date: 07/31/2021 CLINICAL DATA:  Lower abdominal pain with acute renal failure. Suprapubic pain. 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 EXAM: CT ABDOMEN AND PELVIS WITHOUT CONTRAST TECHNIQUE: Multidetector CT imaging of the abdomen and pelvis was performed following the standard protocol without IV contrast. COMPARISON:  06/17/2021 FINDINGS: Lower chest: Basilar atelectasis bilaterally. Hepatobiliary: Ablation defect noted posterior right liver, more pronounced than on the prior study with posterior high attenuation material likely treatment related or small focus of hemorrhage. Similar appearance of reported ablation defect lateral segment left liver. The liver shows diffusely decreased attenuation suggesting fat deposition. Tiny hyperenhancing lesion medial segment left liver seen on the previous study is not evident on today's exam. High attenuation material in the gallbladder lumen is  compatible with vicarious excretion of contrast material. Gallstones evident. No intrahepatic or extrahepatic biliary dilation. Pancreas: No focal mass lesion. No dilatation of the main duct. No intraparenchymal cyst. No peripancreatic edema. Spleen: No splenomegaly. No focal mass lesion. Adrenals/Urinary Tract: No adrenal nodule or mass. Contrast excretion of both kidneys compatible with contrast material from yesterday's CT scan. No evidence for hydroureter. The urinary bladder appears normal for the degree of distention. Stomach/Bowel: Stomach is moderately distended with food and fluid. Duodenum is normally positioned as is the ligament of Treitz. No small bowel wall thickening. No small bowel dilatation. The terminal ileum is normal. The appendix is not well visualized, but there is no edema or inflammation in the region of the cecum. Prominent stool volume right and transverse colon. Suture line noted in the rectum. Vascular/Lymphatic: No abdominal aortic aneurysm. No abdominal lymphadenopathy. No pelvic sidewall lymphadenopathy. Reproductive: No adnexal mass. Other: Since yesterday's study, there is moderate volume high attenuation free fluid in the abdomen and pelvis, seen around the liver and spleen, within the  mesentery, and in the cul-de-sac. Attenuation of the fluid is higher than would be expected for serous fluid suggesting hemoperitoneum or opacified fluid. Of note, there is fairly high attenuation fluid in the left lateral pelvis adjacent to the acetabulum and in the posterior cul-de-sac to the right of the rectum. Musculoskeletal: No worrisome lytic or sclerotic osseous abnormality. L5-S1 spondylolisthesis is similar to prior. IMPRESSION: 1. Interval development of moderate volume high attenuation free fluid in the abdomen and pelvis. Attenuation of the fluid is higher than would be expected for serous fluid. I discussed this case with Dr. Laurence Ferrari at the time of interpretation. Of note, the  patient developed progressive right-sided hydronephrosis and proximal hydroureter during the 1 hour ablation procedure yesterday, well demonstrated on localization scans performed on that study. On today's study, the right ureter can be seen to have an ectopic insertion high on the posterior dome of the bladder. The distal right ureter just proximal to the UVJ is irregular and there appears to be a string like subtle band of extravasated urine adjacent to the right UVJ. As such, these findings are concerning for distal right ureteral injury and ureteral leak as the etiology of the new intraperitoneal fluid. Given that the free fluid is intraperitoneal, this would suggest that the right UVJ is not retroperitoneal in location. However, the patient was already excreting contrast into the collecting systems and ureters by the end of yesterday's study and the intraperitoneal fluid today is not substantially opacified, possibly secondary to some reabsorption of the contrast material via the peritoneum although a component of hemoperitoneum could also have this appearance and cannot be entirely excluded on this exam. 2. Posterior right hepatic lobe ablation defect is increased in size in the interval, consistent with retreatment. Similar appearance of ablation defect in the lateral segment left liver. 3. Cholelithiasis. 4. Prominent stool volume right and transverse colon. Imaging features could be compatible with clinical constipation. 5. L5-S1 spondylolisthesis, similar to prior. Electronically Signed   By: Misty Stanley M.D.   On: 07/31/2021 15:53   DG Retrograde Pyelogram  Result Date: 08/01/2021 CLINICAL DATA:  Distal right ureteral injury from traumatic Foley catheter insertion. EXAM: INTRAOPERATIVE RIGHT RETROGRADE UROGRAPHY TECHNIQUE: Images were obtained with the C-arm fluoroscopic device intraoperatively and submitted for interpretation post-operatively. Please see the procedural report for the amount of  contrast and the fluoroscopy time utilized. COMPARISON:  CT of the abdomen and pelvis on 07/31/2021 FINDINGS: Intraoperative imaging demonstrates cannulation of the right ureter with contrast injection demonstrating probable distal ureteral injury. A guidewire was advanced to the level of the collecting system and a ureteral stent placed. IMPRESSION: Retrograde urography demonstrating distal right ureteral injury. A right ureteral stent was placed. Electronically Signed   By: Aletta Edouard M.D.   On: 08/01/2021 07:56   US RENAL  Result Date: 07/31/2021 CLINICAL DATA:  Right hydronephrosis EXAM: RENAL / URINARY TRACT ULTRASOUND COMPLETE COMPARISON:  None. FINDINGS: Right Kidney: Renal measurements: 9.8 x 5.6 x 6.0 cm = volume: 172 mL. Echogenicity within normal limits. No mass or hydronephrosis visualized. Left Kidney: Renal measurements: 9.9 x 5.0 x 4.7 cm = volume: 121 mL. Echogenicity within normal limits. No mass or hydronephrosis visualized. Bladder: Appears normal for degree of bladder distention. Other: Small amount of free fluid seen in the left flank. IMPRESSION: No hydronephrosis. Small amount of free fluid seen in the left flank. Electronically Signed   By: Yetta Glassman M.D.   On: 07/31/2021 10:01   CT GUIDE TISSUE ABLATION  Result Date: 07/30/2021 INDICATION: 60 year old female with a history of rectal adenocarcinoma with hepatic metastatic disease. She has imaging and biochemical evidence of marginal recurrence at the previously treated ablation site in hepatic segment 6. She presents today for biopsy and repeat percutaneous thermal ablation. EXAM: 1. Ultrasound-guided biopsy hepatic lesion 2. CT and ultrasound-guided percutaneous microwave ablation COMPARISON:  CT abdomen/pelvis 06/17/2021 MEDICATIONS: None. ANESTHESIA/SEDATION: General - as administered by the Anesthesia department FLUOROSCOPY TIME:  None. COMPLICATIONS: None immediate. TECHNIQUE: Informed written consent was obtained  from the patient after a thorough discussion of the procedural risks, benefits and alternatives. All questions were addressed. Maximal Sterile Barrier Technique was utilized including caps, mask, sterile gowns, sterile gloves, sterile drape, hand hygiene and skin antiseptic. A timeout was performed prior to the initiation of the procedure. The patient was placed on the CT gantry. Initial nonenhanced CT imaging was performed. The prior ablation defect is visible. Unfortunately, the liver lesion is essentially occult by noncontrast enhanced CT imaging. Therefore, additional CT imaging was performed with intravenous contrast. This confirms the presence of the mass which has enlarged compared to the relatively recent prior imaging. The mass now measures approximately 3 x 3 x 3 cm. The right upper quadrant was also interrogated with ultrasound. The mass is readily visible by ultrasound. First, biopsy was performed. A suitable skin entry site was selected and marked. A small dermatotomy was made. Under real-time ultrasound guidance, an 18 gauge trocar needle was advanced into the margin the mass. Multiple 18 gauge core biopsies were then obtained coaxially using a bio Pince automated biopsy device. Biopsy specimens were placed in formalin and delivered to pathology for further analysis. Next, 2 NeuWave PR XT probes were selected. The probes were then advanced and positioned within the cephalad and more caudal aspects of the lesion in parallel separated by approximately 1.5 cm. Following adequate placement of the probes, thermal ablation was then performed. Both probes were powered at 65 watts in the ablation was continued for approximately 6 minutes. The ablation zone was monitored both by ultrasound and also CT imaging at approximately 5 minutes. The ablation zone appears to encompass the entirety of the tumor. Following successful ablation, the probes were removed. Repeat CT imaging was then performed with and without  intravenous contrast. Adequate ablation of the region of tumor. No evidence of immediate complication. FINDINGS: Successful percutaneous tumor ablation without evidence of complication. IMPRESSION: 1. Ultrasound-guided core biopsy of presumed recurrent liver metastasis. 2. Successful percutaneous thermal ablation. Electronically Signed   By: Jacqulynn Cadet M.D.   On: 07/30/2021 12:10   CT BIOPSY  Result Date: 07/30/2021 INDICATION: 61 year old female with a history of rectal adenocarcinoma with hepatic metastatic disease. She has imaging and biochemical evidence of marginal recurrence at the previously treated ablation site in hepatic segment 6. She presents today for biopsy and repeat percutaneous thermal ablation. EXAM: 1. Ultrasound-guided biopsy hepatic lesion 2. CT and ultrasound-guided percutaneous microwave ablation COMPARISON:  CT abdomen/pelvis 06/17/2021 MEDICATIONS: None. ANESTHESIA/SEDATION: General - as administered by the Anesthesia department FLUOROSCOPY TIME:  None. COMPLICATIONS: None immediate. TECHNIQUE: Informed written consent was obtained from the patient after a thorough discussion of the procedural risks, benefits and alternatives. All questions were addressed. Maximal Sterile Barrier Technique was utilized including caps, mask, sterile gowns, sterile gloves, sterile drape, hand hygiene and skin antiseptic. A timeout was performed prior to the initiation of the procedure. The patient was placed on the CT gantry. Initial nonenhanced CT imaging was performed. The prior ablation defect is  visible. Unfortunately, the liver lesion is essentially occult by noncontrast enhanced CT imaging. Therefore, additional CT imaging was performed with intravenous contrast. This confirms the presence of the mass which has enlarged compared to the relatively recent prior imaging. The mass now measures approximately 3 x 3 x 3 cm. The right upper quadrant was also interrogated with ultrasound. The mass is  readily visible by ultrasound. First, biopsy was performed. A suitable skin entry site was selected and marked. A small dermatotomy was made. Under real-time ultrasound guidance, an 18 gauge trocar needle was advanced into the margin the mass. Multiple 18 gauge core biopsies were then obtained coaxially using a bio Pince automated biopsy device. Biopsy specimens were placed in formalin and delivered to pathology for further analysis. Next, 2 NeuWave PR XT probes were selected. The probes were then advanced and positioned within the cephalad and more caudal aspects of the lesion in parallel separated by approximately 1.5 cm. Following adequate placement of the probes, thermal ablation was then performed. Both probes were powered at 65 watts in the ablation was continued for approximately 6 minutes. The ablation zone was monitored both by ultrasound and also CT imaging at approximately 5 minutes. The ablation zone appears to encompass the entirety of the tumor. Following successful ablation, the probes were removed. Repeat CT imaging was then performed with and without intravenous contrast. Adequate ablation of the region of tumor. No evidence of immediate complication. FINDINGS: Successful percutaneous tumor ablation without evidence of complication. IMPRESSION: 1. Ultrasound-guided core biopsy of presumed recurrent liver metastasis. 2. Successful percutaneous thermal ablation. Electronically Signed   By: Jacqulynn Cadet M.D.   On: 07/30/2021 12:10    Labs:  CBC: Recent Labs    07/15/21 1342 07/31/21 0527 08/01/21 0457 08/02/21 0534  WBC 5.6 12.5* 10.0 7.2  HGB 13.6 12.4 12.2 10.7*  HCT 42.1 38.6 38.3 33.1*  PLT 257 228 196 182    COAGS: Recent Labs    07/15/21 1342  INR 1.1    BMP: Recent Labs    07/31/21 0528 08/01/21 0457 08/01/21 1058 08/02/21 0534  NA 135 134* 137 139  K 5.1 4.9 4.8 3.8  CL 103 104 106 110  CO2 23 21* 23 26  GLUCOSE 146* 171* 144* 122*  BUN 42* 40* 38* 27*   CALCIUM 8.7* 8.7* 9.0 8.5*  CREATININE 2.75* 3.04* 2.05* 1.00  GFRNONAA 20* 17* 28* >60    LIVER FUNCTION TESTS: Recent Labs    10/30/20 1054 11/14/20 0908 11/28/20 0812 07/15/21 1342 08/01/21 0457 08/01/21 1058 08/02/21 0534  BILITOT 0.5 0.4 0.5 0.6  --   --   --   AST 38 37 39 35  --   --   --   ALT 51* 54* 57* 39  --   --   --   ALKPHOS 135* 138* 133* 97  --   --   --   PROT 7.4 6.9 7.0 7.7  --   --   --   ALBUMIN 3.7 3.6 3.6 3.9 3.4* 3.8 3.1*    Assessment and Plan:  Hepatic lesion MWA 10/26 in IR:  +met rectal cancer Rt ureteral stent placement--- follow with Urology Plan for DC in am Will plan for pain meds at DC Pt is aware and agreeable to this plan  Electronically Signed: Lavonia Drafts, PA-C 08/02/2021, 12:59 PM   I spent a total of 25 Minutes at the the patient's bedside AND on the patient's hospital floor or unit, greater than  50% of which was counseling/coordinating care for hepatic lesion- microwave ablation

## 2021-08-03 ENCOUNTER — Inpatient Hospital Stay (HOSPITAL_COMMUNITY): Payer: 59

## 2021-08-03 MED ORDER — OXYCODONE-ACETAMINOPHEN 5-325 MG PO TABS: 1.0000 | ORAL_TABLET | Freq: Four times a day (QID) | ORAL | 0 refills | Status: DC | PRN

## 2021-08-03 NOTE — Progress Notes (Addendum)
Nurse messaged and said patient had right flank pain today, but the patient had not had any pain medicine. Patient reports she had a BM and got some pain meds and pain improved.   Vitals:   08/03/21 0606 08/03/21 1257  BP: 125/82 140/79  Pulse: 82 90  Resp: 17 16  Temp: 98.3 F (36.8 C) 98.3 F (36.8 C)  SpO2: 95% 98%    Intake/Output Summary (Last 24 hours) at 08/03/2021 1415 Last data filed at 08/03/2021 0611 Gross per 24 hour  Intake 425.23 ml  Output 2250 ml  Net -1824.77 ml   Pt walking around room, getting in wheelchair to go home. Looks well.   To be sure I got a plain x-ray which looks normal and the right stent is in great position.  A/P: Right distal ureteral injury: Presumed to be due to inadvertent placement of Foley catheter into her ectopic reimplanted ureter and subsequently balloon dilated with a Foley catheter balloon.  CT A/P 07/31/2021 with extravasation of urine adjacent to the right distal ureter and free fluid in the pelvis. S/p cysto, R RPG confirming right distal ureteral injury, right stent placement 07/31/2021.  KUB 08/03/2021 confirms good position of stent. Psuedo-azotemia due to intraperitoneal urine leakage: Resolved.  Since stent / foley Cr down to 1 and UOP excellent.  Metastatic rectal cancer s/p low anterior resection in 09/2018 followed by adjuvant chemotherapy and radiation and undergoing microwave ablations to liver mets   -Will plan to leave stent in place for 6 weeks with likely plan for cysto, R RPG prior to removal. -Leave foley catheter to gravity drainage for now. Ideally this will be left in place for several weeks to allow injury to heal without reflux of urine around the stent. Discussed with patient who understands. She would like to followup with Dr. Alyson Ingles. Dr. Abner Greenspan and Dr. Crissie Sickles will coordinate.  -She is clear for discharge from GU point of view

## 2021-08-03 NOTE — Discharge Summary (Signed)
Patient ID: Kerisha Goughnour MRN: [id number] DOB/AGE: January 20, 1961 60 y.o.  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 Laurence Ferrari; path revealed metastatic rectal cancer; now with some persistent lower abdominal/suprapubic discomfort which has persisted since traumatic Foley cath removal in PACU along with acute renal failure; patient seen by both [facility] and nephrology-appreciate their assistance      Per Urology note 10/27: She does have a history of metastatic rectal adenocarcinoma with lesions in her liver.  She has been receiving microwave ablations to the lesions by Dr. Laurence Ferrari.  She underwent a repeat microwave ablation on 07/30/2021.  A Foley catheter was placed perioperatively.  Her foley catheter was removed after the procedure in recovery that caused severe pain.  She was noted to have gross hematuria and was unable to void for prolonged period of time with only 120 mL bladder scanned.  She underwent intermittent catheterizations with return of clear yellow urine.  She was found to have an AKI with creatinine 2.75 from a baseline of less than 1. CT A/P 07/31/2021 demonstrated fluid in her abdomen pelvis with subtle area of extravasated urine adjacent to the right ureteral vesicle junction at the dome of her bladder concerning for a distal right ureteral injury and ureteral leak.  Retrospective review of  intraoperative imaging during microwave ablation did demonstrate some right hydronephrosis.  This could be explained by Foley catheter being inadvertently placed into the right ureter during the case.   Cystoscopy demonstrated no suspicious lesions, masses, stones or other pathology. 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 A safety wire was able to be passed into the kidney A distal right retrograde pyelogram demonstrated significant extravasation of contrast medially. A repeat retrograde pyelogram demonstrated no hydronephrosis with no filling defects in the collecting system. Successful right ureteral stent placement with curl in the renal pelvis and bladder respectively. Plan: We will plan to leave the right ureteral stent in place for the 6 weeks.  She will be taken back to the operating room in 6 weeks for repeat right retrograde pyelogram with possible right ureteral stent removal.  We will need to leave Foley catheter to gravity drainage for a prolonged period of time to ensure no reflux into her abdominal cavity.  Pt has done well over this admission Intent to DC 10/29- but was still with significant low abdominal pain Discussed with Dr Laurence Ferrari Changed pain meds to Percocet for overnight She is much better today She had 3 BMs last night and feels so much better BMs were noted to have some blood within them--- but no bleeding from rectum outside of BM Planned for Colonoscopy 11/15 with Dr Jacqlyn Krauss is great: clearing yellow urine in foley bag Pt asking for leg bag to go home with .  I have seen and examined pt today I have discussed status with Dr Laurence Ferrari Plan for discharge home today   Consults: Urology  Significant Diagnostic Studies: CT guided tissue ablation  Treatments:  07/30/21 IR procedure  IMPRESSION: 1. Ultrasound-guided core biopsy of presumed recurrent liver metastasis. 2. Successful percutaneous thermal  ablation.  Discharge Exam: Blood pressure 125/82, pulse 82, temperature 98.3 F (36.8 C), temperature source Oral, resp. rate 17, height 5' 8.5" (1.74 m), weight 216 lb 0.8 oz (98 kg), SpO2 95 %.  A/O [place]: RRR Lungs: CTA Abd: soft; minimal soreness low abd No masses Site of Hepatic lesion ablation: clean and dry; NT Small area of ecchymosis FROM- ambulatory UOP yellow; clear - in bag Foley in place  Results for orders placed or performed during the hospital encounter of 74/08/14  Basic metabolic panel  Result Value Ref Range   Sodium 135 135 - 145 mmol/L   Potassium 5.1 3.5 - 5.1 mmol/L   Chloride 103 98 - 111 mmol/L   CO2 23 22 - 32 mmol/L   Glucose, Bld 146 (H) 70 - 99 mg/dL   BUN 42 (H) 6 - 20 mg/dL   Creatinine, Ser 2.75 (H) 0.44 - 1.00 mg/dL   Calcium 8.7 (L) 8.9 - 10.3 mg/dL   GFR, Estimated 20 (L) >60 mL/min   Anion gap 9 5 - 15  CBC with Differential/Platelet  Result Value Ref Range   WBC 12.5 (H) 4.0 - 10.5 K/uL   RBC 3.98 3.87 - 5.11 MIL/uL   Hemoglobin 12.4 12.0 - 15.0 g/dL   HCT 38.6 36.0 - 46.0 %   MCV 97.0 80.0 - 100.0 fL   MCH 31.2 26.0 - 34.0 pg   MCHC 32.1 30.0 - 36.0 g/dL   RDW 13.4 11.5 - 15.5 %   Platelets 228 150 - 400 K/uL   nRBC 0.0 0.0 - 0.2 %   Neutrophils Relative % 87 %   Neutro Abs 10.9 (H) 1.7 - 7.7 K/uL   Lymphocytes Relative 7 %   Lymphs Abs 0.9 0.7 - 4.0 K/uL   Monocytes Relative 6 %   Monocytes Absolute 0.7 0.1 - 1.0 K/uL   Eosinophils Relative 0 %   Eosinophils Absolute 0.0 0.0 - 0.5 K/uL   Basophils Relative 0 %   Basophils Absolute 0.0 0.0 - 0.1 K/uL   Immature Granulocytes 0 %   Abs Immature Granulocytes 0.04 0.00 - 0.07 K/uL  Renal function panel  Result Value Ref Range   Sodium 134 (L) 135 - 145 mmol/L   Potassium 4.9 3.5 - 5.1 mmol/L   Chloride 104 98 - 111 mmol/L   CO2 21 (L) 22 - 32 mmol/L   Glucose, Bld 171 (H) 70 - 99 mg/dL   BUN 40 (H) 6 - 20 mg/dL   Creatinine, Ser 3.04 (H) 0.44 - 1.00 mg/dL    Calcium 8.7 (L) 8.9 - 10.3 mg/dL   Phosphorus 4.3 2.5 - 4.6 mg/dL   Albumin 3.4 (L) 3.5 - 5.0 g/dL   GFR, Estimated 17 (L) >60 mL/min   Anion gap 9 5 - 15  CBC with Differential/Platelet  Result Value Ref Range   WBC 10.0 4.0 - 10.5 K/uL   RBC 3.94 3.87 - 5.11 MIL/uL   Hemoglobin 12.2 12.0 - 15.0 g/dL   HCT 38.3 36.0 - 46.0 %   MCV 97.2 80.0 - 100.0 fL   MCH 31.0 26.0 - 34.0 pg   MCHC 31.9 30.0 - 36.0 g/dL   RDW 13.6 11.5 - 15.5 %   Platelets 196 150 - 400 K/uL   nRBC 0.0 0.0 - 0.2 %   Neutrophils Relative % 94 %   Neutro Abs 9.4 (  H) 1.7 - 7.7 K/uL   Lymphocytes Relative 4 %   Lymphs Abs 0.4 (L) 0.7 - 4.0 K/uL   Monocytes Relative 2 %   Monocytes Absolute 0.2 0.1 - 1.0 K/uL   Eosinophils Relative 0 %   Eosinophils Absolute 0.0 0.0 - 0.5 K/uL   Basophils Relative 0 %   Basophils Absolute 0.0 0.0 - 0.1 K/uL   Immature Granulocytes 0 %   Abs Immature Granulocytes 0.04 0.00 - 0.07 K/uL  Creatinine, urine, random  Result Value Ref Range   Creatinine, Urine 97.15 mg/dL  Protein / creatinine ratio, urine  Result Value Ref Range   Creatinine, Urine 99.17 mg/dL   Total Protein, Urine 36 mg/dL   Protein Creatinine Ratio 0.36 (H) 0.00 - 0.15 mg/mg[Cre]  Sodium, urine, random  Result Value Ref Range   Sodium, Ur 60 mmol/L  Urinalysis, Complete w Microscopic Urine, Random  Result Value Ref Range   Color, Urine YELLOW YELLOW   APPearance CLEAR CLEAR   Specific Gravity, Urine 1.020 1.005 - 1.030   pH 5.0 5.0 - 8.0   Glucose, UA NEGATIVE NEGATIVE mg/dL   Hgb urine dipstick LARGE (A) NEGATIVE   Bilirubin Urine NEGATIVE NEGATIVE   Ketones, ur NEGATIVE NEGATIVE mg/dL   Protein, ur 30 (A) NEGATIVE mg/dL   Nitrite NEGATIVE NEGATIVE   Leukocytes,Ua MODERATE (A) NEGATIVE   RBC / HPF >50 (H) 0 - 5 RBC/hpf   WBC, UA 21-50 0 - 5 WBC/hpf   Bacteria, UA RARE (A) NONE SEEN   Squamous Epithelial / LPF 0-5 0 - 5   Mucus PRESENT   Renal function panel  Result Value Ref Range   Sodium 137  135 - 145 mmol/L   Potassium 4.8 3.5 - 5.1 mmol/L   Chloride 106 98 - 111 mmol/L   CO2 23 22 - 32 mmol/L   Glucose, Bld 144 (H) 70 - 99 mg/dL   BUN 38 (H) 6 - 20 mg/dL   Creatinine, Ser 2.05 (H) 0.44 - 1.00 mg/dL   Calcium 9.0 8.9 - 10.3 mg/dL   Phosphorus 4.1 2.5 - 4.6 mg/dL   Albumin 3.8 3.5 - 5.0 g/dL   GFR, Estimated 28 (L) >60 mL/min   Anion gap 8 5 - 15  Renal function panel  Result Value Ref Range   Sodium 139 135 - 145 mmol/L   Potassium 3.8 3.5 - 5.1 mmol/L   Chloride 110 98 - 111 mmol/L   CO2 26 22 - 32 mmol/L   Glucose, Bld 122 (H) 70 - 99 mg/dL   BUN 27 (H) 6 - 20 mg/dL   Creatinine, Ser 1.00 0.44 - 1.00 mg/dL   Calcium 8.5 (L) 8.9 - 10.3 mg/dL   Phosphorus 2.8 2.5 - 4.6 mg/dL   Albumin 3.1 (L) 3.5 - 5.0 g/dL   GFR, Estimated >60 >60 mL/min   Anion gap 3 (L) 5 - 15  CBC with Differential/Platelet  Result Value Ref Range   WBC 7.2 4.0 - 10.5 K/uL   RBC 3.43 (L) 3.87 - 5.11 MIL/uL   Hemoglobin 10.7 (L) 12.0 - 15.0 g/dL   HCT 33.1 (L) 36.0 - 46.0 %   MCV 96.5 80.0 - 100.0 fL   MCH 31.2 26.0 - 34.0 pg   MCHC 32.3 30.0 - 36.0 g/dL   RDW 13.9 11.5 - 15.5 %   Platelets 182 150 - 400 K/uL   nRBC 0.0 0.0 - 0.2 %   Neutrophils Relative % 68 %   Neutro  Abs 4.9 1.7 - 7.7 K/uL   Lymphocytes Relative 19 %   Lymphs Abs 1.4 0.7 - 4.0 K/uL   Monocytes Relative 10 %   Monocytes Absolute 0.7 0.1 - 1.0 K/uL   Eosinophils Relative 2 %   Eosinophils Absolute 0.1 0.0 - 0.5 K/uL   Basophils Relative 0 %   Basophils Absolute 0.0 0.0 - 0.1 K/uL   Immature Granulocytes 1 %   Abs Immature Granulocytes 0.08 (H) 0.00 - 0.07 K/uL  I-STAT creatinine  Result Value Ref Range   Creatinine, Ser 3.00 (H) 0.44 - 1.00 mg/dL  Type and screen  Result Value Ref Range   ABO/RH(D) A POS    Antibody Screen NEG    Sample Expiration      08/02/2021,2359 Performed at [facility] [address] [place], [place] [address]   Surgical pathology  Result Value Ref Range    SURGICAL PATHOLOGY      SURGICAL PATHOLOGY CASE: [id number] PATIENT: Eugenia Caraher Surgical Pathology Report     Clinical History: Rectal cancer met to liver with new progression. Asses for change in morphology/markers. (crm)     FINAL MICROSCOPIC DIAGNOSIS:  A. LIVER, BIOPSY: - Adenocarcinoma consistent with metastatic rectal adenocarcinoma. - See comment.  COMMENT: The morphologic features are compatible with metastatic colorectal adenocarcinoma.  Called to Dr. Benay Spice on 07/31/2021.  Notes all  GROSS DESCRIPTION: Received in formalin are 4 soft tan-white cores of tissue measuring 0.7 to 1.8 cm in length and each less than 0.1 cm in diameter.  The specimen is entirely submitted in 2 cassettes.  [facility] 07/30/2021)  Final Diagnosis performed by Claudette Laws, MD.   Electronically signed 07/31/2021 Technical and / or Professional components performed at [facility] [place] [address], [place], [place] [address].  Immunohistochemistry Technical component (if ap plicable) was performed at [facility]. [address], [place], [place], [place] [address].   IMMUNOHISTOCHEMISTRY DISCLAIMER (if applicable): Some of these immunohistochemical stains may have been developed and the performance characteristics determine by [facility]. Some may not have been cleared or approved by the U.S. Food and Drug Administration. The FDA has determined that such clearance or approval is not necessary. This test is used for clinical purposes. It should not be regarded as investigational or for research. This laboratory is certified under the [place] (CLIA-88) as qualified to perform high complexity clinical laboratory testing.  The controls stained appropriately.        Disposition:  Pt is doing well this am In good spirits; pleasant *Plans to follow with Urology Dr Alyson Ingles: Will plan to leave  stent in place for 6 weeks with likely plan for cysto, R RPG prior to removal. -Leave foley catheter to gravity drainage for now. Ideally this will be left in place for several weeks to allow injury to heal without reflux of urine around the stent. Discussed with patient who understands. She would like to followup with Dr. Alyson Ingles. Dr. Abner Greenspan and Dr. Crissie Sickles will coordinate.  *Plans to follow with Nephrology: . [place] for discharge from renal standpoint and will f/u in our office in 2 weeks but hopefully will normalize with time.  No indication for dialysis at this time. *Plans to follow with Dr Foy Guadalajara- GI: ok to proceed with colonoscopy on 11/15 as scheduled. *Percocet 5/325 1-2 every 6-8 as needed for severe pain #15 For severe pain only- pt has Hydrocodone at home she can use instead of Percocet  as needed----do not use these meds together (pt agreeable). *IR will call pt in 3-4 weeks    Allergies as of 08/03/2021       Reactions   Prednisone Other (See Comments)   "Felt like she was crazy" if on it more than 1 week   Codeine Other (See Comments)   Causes migraine--patient requests this be kept on allergy list        Medication List     TAKE these medications    alprazolam 2 MG tablet Commonly known as: XANAX Take 2 mg by mouth every morning.   aspirin EC 81 MG tablet Take 81 mg by mouth every morning. Swallow whole.   Biotin 10 MG Caps Take 10 mg by mouth daily.   buPROPion 300 MG 24 hr tablet Commonly known as: WELLBUTRIN XL Take 300 mg by mouth every morning.   calcium carbonate 1500 (600 Ca) MG Tabs tablet Commonly known as: OSCAL Take 600 mg of elemental calcium by mouth daily.   CITRUCEL PO Take 1 tablet by mouth daily.   cyclobenzaprine 10 MG tablet Commonly known as: FLEXERIL Take 20 mg by mouth at bedtime.   Dialyvite Vitamin D 5000 125 MCG (5000 UT) capsule Generic drug: Cholecalciferol Take 5,000 Units by mouth daily.   diphenoxylate-atropine 2.5-0.025 MG  tablet Commonly known as: LOMOTIL TAKE 1 TO 2 TABLETS BY MOUTH FOUR TIMES DAILY AS NEEDED FOR DIARRHEA OR LOOSE STOOLS   ECHINACEA-ZINC-VITAMIN C PO Take 1 tablet by mouth daily.   Fish Oil 1000 MG Caps Take 1,000 mg by mouth daily.   HYDROcodone-acetaminophen 10-325 MG tablet Commonly known as: NORCO Take 1-2 tablets by mouth every 6 (six) hours as needed for moderate pain or severe pain (headache). Post-Operatively.   IMMUNE SUPPORT PO Take 1 tablet by mouth daily.   lidocaine-prilocaine cream Commonly known as: EMLA Apply 1 application topically as directed. Apply 1-2 hours prior to IV stick and cover with plastic wrap   lubiprostone 24 MCG capsule Commonly known as: AMITIZA Take 1 capsule (24 mcg total) by mouth 2 (two) times daily with a meal.   METAMUCIL FIBER PO Take 1 capsule by mouth daily.   multivitamin with minerals Tabs tablet Take 1 tablet by mouth daily.   Na Sulfate-K Sulfate-Mg Sulf 17.5-3.13-1.6 GM/177ML Soln Take 1 kit by mouth as directed. May use generic Suprep   oxyCODONE-acetaminophen 5-325 MG tablet Commonly known as: PERCOCET/ROXICET Take 1-2 tablets by mouth every 6 (six) hours as needed for severe pain.   PRENATAL PO Take 1 tablet by mouth daily.   PROBIOTIC PO Take 1 tablet by mouth every morning. Acidophilus 100 million   rOPINIRole 1 MG tablet Commonly known as: REQUIP Take 1 mg by mouth at bedtime.   TURMERIC PO Take 1 tablet by mouth daily.   vitamin B-12 1000 MCG tablet Commonly known as: CYANOCOBALAMIN Take 1,000 mcg by mouth daily.   vitamin C 1000 MG tablet Take 1,000 mg by mouth daily.               Discharge Care Instructions  (From admission, onward)           Start     Ordered   08/03/21 0000  Discharge wound care:       Comments: Bandaid at hepatic lesion site--- as needed   08/03/21 1856            Follow-up Information     Janith Lima, MD Follow up.   Specialty:  Urology Why: [place] - Office will call with appointment Contact information: [address] [place]  [phone]                  Electronically Signed: Lavonia Drafts, PA-C 08/03/2021, 9:03 AM   I have spent Greater Than 30 Minutes discharging Benjamine Sprague.

## 2021-08-03 NOTE — Progress Notes (Signed)
Patient complained of sudden right sided flank pain, 9/10. PRn oxycodone was given, but Urology was paged to determine if this was expected. X-ray was ordered, and Dr. Junious Silk came to the bedside to assure patient that her x-ray looked normal and her stent was still in the correct position.   Patient was discharged to home, AVS was reviewed and the patient will make appropriate follow up appointments. She confirmed that her prescriptions were sent to the correct pharmacy and she had all of her belongings. A standard urinary drainage bag was provided and foley catheter was connected to a leg bag. Patient was educated on foley maintenance with teach back, and printed instructions were provided.   Patient was assisted to the exit via wheelchair and a family friend provided transportation.

## 2021-08-03 NOTE — TOC Transition Note (Signed)
Transition of Care [facility]) - CM/SW Discharge Note   Patient Details  Name: Norma Colon MRN: [id number] Date of Birth: 04/27/1961  Transition of Care [facility]) CM/SW Contact:  Dessa Phi, RN Phone Number: 08/03/2021, 11:13 AM   Clinical Narrative:  d/c home no CM needs or orders.     Final next level of care: Home/Self Care Barriers to Discharge: No Barriers Identified   Patient Goals and CMS Choice        Discharge Placement                       Discharge Plan and Services                                     Social Determinants of Health (SDOH) Interventions     Readmission Risk Interventions No flowsheet data found.

## 2021-08-04 NOTE — Telephone Encounter (Addendum)
Returned call to patient, she states that she is expecting a call from Dr. Bryan Lemma. She would like to discuss upcoming colonoscopy. Pt would not disclose any further information. Pt is aware that Dr. Bryan Lemma is not in the office today but we will get the message to him.

## 2021-08-04 NOTE — Telephone Encounter (Signed)
Patient called requesting to speak with Dr. Bryan Lemma and only him regarding the colonoscopy did not want to provide any further information\details.

## 2021-08-06 ENCOUNTER — Other Ambulatory Visit: Payer: Self-pay

## 2021-08-06 NOTE — Telephone Encounter (Signed)
Patient calling asking for refill on  oxyCODONE-acetaminophen (PERCOCET/ROXICET) 5-325 MG tablet  Message sent to MD

## 2021-08-06 NOTE — Telephone Encounter (Signed)
I spoke with Norma Colon today.  She had a recent prolonged hospital course following microwave ablation of metastatic liver lesion.  Postoperative course complicated by right ureteral injury with extravasation of urine into peritoneum requiring urgent cystoscopy with ureteral stent and right kidney/bladder and indwelling Foley catheter.  She was scheduled for colonoscopy on 11/15 with me.  However, in light of recent complicated course and need for repeat surgery in 6 weeks, she does not want to proceed with colonoscopy which is understandable.  Discussed the indications for colonoscopy for her, to include evaluation for metachronous colon cancer given metastatic disease currently being treated.  She would like to wait until January before revisiting any plans of repeat colonoscopy.    Per patient request, please cancel colonoscopy that was scheduled for 11/15, and instead schedule for OV with me in January where we can follow-up on her clinical course at that time and discuss risk/benefits of repeat colonoscopy.

## 2021-08-07 ENCOUNTER — Other Ambulatory Visit: Payer: Self-pay | Admitting: Interventional Radiology

## 2021-08-07 ENCOUNTER — Other Ambulatory Visit (HOSPITAL_COMMUNITY): Payer: Self-pay | Admitting: Radiology

## 2021-08-07 ENCOUNTER — Encounter: Payer: Self-pay | Admitting: Urology

## 2021-08-07 DIAGNOSIS — C189 Malignant neoplasm of colon, unspecified: Secondary | ICD-10-CM

## 2021-08-07 DIAGNOSIS — C787 Secondary malignant neoplasm of liver and intrahepatic bile duct: Secondary | ICD-10-CM

## 2021-08-07 MED ORDER — OXYCODONE-ACETAMINOPHEN 5-325 MG PO TABS: 1.0000 | ORAL_TABLET | Freq: Four times a day (QID) | ORAL | 0 refills | Status: DC | PRN

## 2021-08-07 NOTE — Telephone Encounter (Signed)
Left detailed vm for patient letting her know that we have cancelled her upcoming colonoscopy and that Dr. Bryan Lemma wanted to see her back in the office in January. Advised patient to call back to schedule a follow up appt with Dr. Bryan Lemma.

## 2021-08-07 NOTE — Progress Notes (Signed)
[facility] OFFICE PROGRESS NOTE   Diagnosis: Rectal cancer  INTERVAL HISTORY:   Norma Colon underwent biopsy and ablation of the recurrent liver tumor on 07/30/2021.  She developed gross hematuria and pain following removal of Foley catheter.  A CT revealed extravasation of urine adjacent to the right ureteral vesicle junction concerning for a distal right ureteral injury and leak.  She underwent right ureter stent placement.  A retrograde pyelogram confirmed extravasation of contrast medially.  She was discharged home with a stent in place and Foley catheter.  She is scheduled to see Dr. Alyson Ingles next week.  Norma Colon reports diffuse abdominal discomfort and soreness at the ablation site.  Objective:  Vital signs in last 24 hours:  Blood pressure 140/84, pulse 99, temperature 98.1 F (36.7 C), temperature source Oral, resp. rate 18, weight 216 lb 12.8 oz (98.3 kg), SpO2 100 %.    Resp: Lungs clear bilaterally Cardio: Regular rate and rhythm GI: Soft, (abdominal exam declined) Vascular: No leg edema  Skin: Resolving ecchymosis of the right lateral chest wall  Portacath/PICC-without erythema  Lab Results:  Lab Results  Component Value Date   WBC 7.2 08/02/2021   HGB 10.7 (L) 08/02/2021   HCT 33.1 (L) 08/02/2021   MCV 96.5 08/02/2021   PLT 182 08/02/2021   NEUTROABS 4.9 08/02/2021    CMP  Lab Results  Component Value Date   NA 139 08/02/2021   K 3.8 08/02/2021   CL 110 08/02/2021   CO2 26 08/02/2021   GLUCOSE 122 (H) 08/02/2021   BUN 27 (H) 08/02/2021   CREATININE 1.00 08/02/2021   CALCIUM 8.5 (L) 08/02/2021   PROT 7.7 07/15/2021   ALBUMIN 3.1 (L) 08/02/2021   AST 35 07/15/2021   ALT 39 07/15/2021   ALKPHOS 97 07/15/2021   BILITOT 0.6 07/15/2021   GFRNONAA >60 08/02/2021   GFRAA >60 06/17/2020    Lab Results  Component Value Date   CEA1 6.85 (H) 06/17/2021   CEA 3.05 08/08/2021      Medications: I have reviewed the patient's  current medications.   Assessment/Plan: Rectal cancer Colonoscopy 05/17/2018 revealed a partially obstructing mass beginning at 10 cm from the anal verge, biopsy confirmed invasive moderately differentiated adenocarcinoma CTs 05/24/2018-mass beginning at proximal he 7.6 cm from the anal verge, "20 "perirectal lymph nodes, no evidence of metastatic disease Pelvic MRI 06/01/2018- tumor measured at 10.8 cm from the anal sphincter, T3b, N1-2 left perirectal lymph nodes, each measuring 5 mm Radiation and Xeloda initiated 06/13/2018, completed 07/20/2018 Low anterior resection 09/16/2018,ypT3,ypN1c, 1 cm tumor, partial response-score 2, 0/13 lymph nodes positive, 2 tumor deposits, MSI-stable, no loss of mismatch repair protein expression Cycle 1 CAPOX 10/17/2018 Cycle 2 CAPOX 11/07/2018 (oxaliplatin dose reduced due to neutropenia) Cycle 3 CAPOX 11/28/2018 Cycle 4 CAPOX 12/19/2018 CTs 05/29/2019-no evidence of metastatic disease, severe right hydronephrosis, left thyroid nodule CTs 04/11/2020-subtle nodular lesion in the inferior right liver suspicious for a new metastasis, status post right ureteral resection and psoas hitch/flap, resolution of hydronephrosis, sacral insufficiency fractures bilaterally MRI liver 05/08/2020-2.1 cm segment 6 lesion suspicious for a metastasis, 4 mm suspicious segment 3 lesion 4 mm segment 4B lesion consistent with a hemangioma, no abdominal lymphadenopathy, cholelithiasis Ablation of segment 6 and segment 3 lesions 06/19/2020, pathology from segment 6 biopsy confirmed metastatic adenocarcinoma-microsatellite stable, tumor mutation burden 3, K-ras wild type, NRAS wild-type, BRAF [id number] MRI abdomen 08/13/2020-ablation defects in segment 6 and segment 3, tiny foci of restricted diffusion with arterial phase enhancement  at the junctions of segment 4A and segment 4B, and segment 5 concerning for metastatic disease CTs 09/09/2020-stable ablation sites at segment 6 and segment 2. Small focus of  hyperenhancement measuring 7 mm in the inferior right liver segment 6 anterior to the ablation site, and in the anterior right liver segment 5/8-unchanged compared to MRI and concerning for metastatic disease, no other evidence of metastatic disease. Scattered irregular groundglass opacities in the posterior right upper lobe Cycle 1 FOLFIRI/Avastin 09/23/2020 Treatment held 10/08/2020 due to neutropenia Cycle 2 FOLFIRI/Avastin 10/14/2020, Udenyca Cycle 3 FOLFIRI/Avastin 10/30/2020, Udenyca Cycle 4 FOLFIRI/Avastin 11/14/2020, Udenyca Cycle 5 FOLFIRI/Avastin 11/28/2020, Udenyca CTs 12/11/2020-no evidence of metastatic disease in the chest, stable liver ablation sites, stable hypervascular lesion in segment 4A, previously described enhancing lesion in the right hepatic lobe not well appreciated, no new lesions\ Real-time ultrasound evaluation by Dr. Laurence Ferrari 12/18/2020-no lesions are visible sonographically, therefore not a candidate for ablation Upper quadrant ultrasound 02/25/2021-no new or progressive hepatic metastases, prior ablation defects noted CTs 06/17/2021-unchanged 1.5 cm mixed attenuation left hepatic lesion, stable heterogenous lesion in the posterior right liver, no evidence of new metastatic disease-for review revealed 1.7 cm nodular lesion at the superior margin of the ablation site in the right liver concerning for recurrent disease CEA mildly elevated 06/17/2021 Ultrasound-guided biopsy of lesion followed by ablation encompassing the entirety of the tumor in the right liver, segment 6,, repeat CT revealed adequate ablation of the region of tumor, 07/30/2021, biopsy adenocarcinoma consistent with metastatic rectal adenocarcinoma CEA normal 08/08/2021 History of Tobacco use Right UPJ stone on CT 05/24/2018 CT 11/21/2018-migration of previously noted right UPJ stone, new marked right hydroureteronephrosis Placement of a right JJ stent 11/21/2018 Laser lithotripsy and replacement of right JJ stent  12/12/2018 Followed by Dr. Alyson Ingles, urology Migraine headaches Restless legs Arm pain during the oxaliplatin infusion and for 1 week after cycle 1.  The oxaliplatin will be further diluted and infusion time extended getting with cycle 2. Neutropenia secondary to chemotherapy.  Oxaliplatin dose reduced beginning with cycle 2. 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 Alyson Ingles, surgical reimplantation of the right ureter on 08/03/2019, right ureter stent removed 09/14/2019 Thyroid nodules-new versus enlarged left thyroid nodule CT 05/29/2019, thyroid ultrasound 06/20/2019-multiple nodules, 1 right-sided nodule met criteria for ultrasound follow-up Ultrasound 05/08/2020-multinodular goiter, stable nodules in the isthmus and right thyroid meeting criteria for 1 year follow-up, additional stable nodules do not meet criteria for follow-up 12.  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  She underwent ablation of the recurrent tumor with right liver ablation site on 07/30/2021.  The lesion was ablated.  The procedure was complicated by ureteral injury from placement of a Foley catheter.  She now has a right ureter stent and Foley catheter in place.  She is scheduled to see Dr. Alyson Ingles next week.  The CEA has normalized.  Norma Colon will return for an office visit and CEA in 6 weeks.  We will plan restaging imaging for within the next 3-4 months.  Norma Coder, MD  08/08/2021  1:09 PM

## 2021-08-08 ENCOUNTER — Inpatient Hospital Stay: Payer: 59

## 2021-08-08 ENCOUNTER — Inpatient Hospital Stay: Payer: 59 | Attending: Oncology

## 2021-08-08 ENCOUNTER — Other Ambulatory Visit: Payer: Self-pay

## 2021-08-08 ENCOUNTER — Inpatient Hospital Stay (HOSPITAL_BASED_OUTPATIENT_CLINIC_OR_DEPARTMENT_OTHER): Payer: 59 | Admitting: Oncology

## 2021-08-08 ENCOUNTER — Encounter: Payer: Self-pay | Admitting: Oncology

## 2021-08-08 VITALS — BP 140/84 | HR 99 | Temp 98.1°F | Resp 18 | Wt 216.8 lb

## 2021-08-08 DIAGNOSIS — C20 Malignant neoplasm of rectum: Secondary | ICD-10-CM | POA: Insufficient documentation

## 2021-08-08 DIAGNOSIS — G2581 Restless legs syndrome: Secondary | ICD-10-CM | POA: Diagnosis not present

## 2021-08-08 DIAGNOSIS — E041 Nontoxic single thyroid nodule: Secondary | ICD-10-CM | POA: Insufficient documentation

## 2021-08-08 DIAGNOSIS — Z87891 Personal history of nicotine dependence: Secondary | ICD-10-CM | POA: Diagnosis not present

## 2021-08-08 DIAGNOSIS — G43909 Migraine, unspecified, not intractable, without status migrainosus: Secondary | ICD-10-CM | POA: Insufficient documentation

## 2021-08-08 LAB — CEA (ACCESS): CEA (CHCC): 3.05 ng/mL (ref 0.00–5.00)

## 2021-08-08 NOTE — Telephone Encounter (Signed)
Left detailed vm for patient asking that she call back at her convenience to schedule a follow up with Dr. Bryan Lemma in January.

## 2021-08-12 ENCOUNTER — Encounter: Payer: Self-pay | Admitting: *Deleted

## 2021-08-12 ENCOUNTER — Other Ambulatory Visit: Payer: Self-pay

## 2021-08-12 ENCOUNTER — Ambulatory Visit
Admission: RE | Admit: 2021-08-12 | Discharge: 2021-08-12 | Disposition: A | Discharge status: Home / Self Care | Payer: 59 | Source: Ambulatory Visit | Attending: Interventional Radiology | Admitting: Interventional Radiology

## 2021-08-12 DIAGNOSIS — C189 Malignant neoplasm of colon, unspecified: Secondary | ICD-10-CM

## 2021-08-12 HISTORY — PX: IR RADIOLOGIST EVAL & MGMT: IMG5224

## 2021-08-12 NOTE — Progress Notes (Signed)
Chief Complaint: Patient was consulted remotely today (TeleHealth) for colorectal cancer metastatic to liver at the request of Jamar Casagrande K.    Referring Physician(s): Sherrill,Gary B  History of Present Illness: Norma Colon is a 60 y.o. female who initially presented at the kind request of Dr. 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  Norma Colon was initially diagnosed with a partially obstructing rectal mass in August 2019.  She underwent neoadjuvant combined radiation and chemotherapy (Xeloda) from September through October 2019 followed by a low anterior resection in December 2019.     Subsequently, she underwent 4 cycles of additional adjuvant chemotherapy with CAPOX completed in March 2020.  Subsequent surveillance imaging initially detected no evidence of metastatic disease.  Unfortunately, CT imaging from July 2021 demonstrated a subtle lesion in hepatic segment 6 concerning for metastatic disease.  A follow-up MRI dated 05/08/2020 confirms a 2.1 cm solid lesion in hepatic segment 6 as well as a concerning for millimeter lesion in the posterior aspect of segment 3.  Both lesions are concerning for metastatic disease.   She underwent concurrent ultrasound-guided biopsy and microwave ablation of the 2 liver lesions on 06/19/2020.     Follow-up MRI imaging from 08/13/20 showed an excellent treatment effect at the sites of the 2 ablated lesions, but 2 new lesions highly concerning for additional metastatic foci.  Norma Colon therefore went back for additional chemotherapy and has now completed another cycle of FOLFIRI/Avastin on 11/28/2020.  She reported increased malaise following this cycle of chemotherapy.  She had diarrhea and mild nosebleeding.   Restaging CT scans of the chest, abdomen and pelvis dated 12/12/2020 demonstrates no evidence of new metastatic disease or disease outside the liver.  The previously  identified tiny hypervascular lesions in segment 4A and segment 6 are less conspicuous and perhaps slightly smaller consistent with interval treatment response.     US imaging performed by me on  12/18/20 and again on 02/25/21 showed no visible hepatic lesions.    Re-staging CT scans 06/17/21 - New 1.7 cm lesion adjacent to the posterosuperior margin of the segment 6 ablation defect concerning for recurrent disease.    She underwent CT-guided biopsy and repeat microwave ablation of the recurrent tumor on 07/30/2021.  Unfortunately, at that time she sustained an inadvertent injury to the right ureter presumably due to unfortunate Foley catheter placement with in the reimplanted ureter.  Fortunately, we were able to diagnose this within the first day and urology was able to traverse the injury and place a double-J ureteral stent.  She also developed acute kidney injury with elevated creatinine due to 2 peritoneal reabsorption of the leaking urine.  Since the time of discharge her creatinine has normalized.  She follows up with Dr. Alyson Ingles this Friday.  She has a subsequent follow-up appointment with Dr. Marval Regal of nephrology.  She is doing as well as can be expected.  Her Foley catheter is uncomfortable but she is managing.  She has some bruising along her right chest wall in the region of the ablation, likely residual of mild hematoma in the soft tissues.  She denies abdominal pain, nausea, vomiting or new issues.  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[facility]) oncologist-- dr Benay Spice--  per last note in epic ,  clinical remission   dx 08/ 2019---- chemoradiation concurrent completed 07-20-2018;  s/p  low anterior resection 09-16-2018;   chemo 10-17-2018  ot 12-19-2018  Anxiety    Arthritis    Chemotherapy induced neutropenia (HCC)    Depression    Headache    migraines    History of HPV infection    History of kidney stones    Hydronephrosis, right    Restless leg  syndrome    Scoliosis    Sepsis ([place])    11-2018    Past Surgical History:  Procedure Laterality Date   COLONOSCOPY     COLPOSCOPY  04/04/2018   CYSTOSCOPY W/ RETROGRADES Right 07/31/2021   Procedure: CYSTOSCOPY WITH RETROGRADE PYELOGRAM, URETEROSCOPY AND STENT PLACEMENT;  Surgeon: Janith Lima, MD;  Location: [place] [facility];  Service: Urology;  Laterality: Right;   CYSTOSCOPY WITH RETROGRADE PYELOGRAM, URETEROSCOPY AND STENT PLACEMENT Right 12/12/2018   Procedure: CYSTOSCOPY WITH RETROGRADE PYELOGRAM, URETEROSCOPY, STONE EXTRACTION AND STENT PLACEMENT;  Surgeon: Cleon Gustin, MD;  Location: [facility];  Service: Urology;  Laterality: Right;   CYSTOSCOPY WITH RETROGRADE PYELOGRAM, URETEROSCOPY AND STENT PLACEMENT Right 04/17/2019   Procedure: CYSTOSCOPY WITH RETROGRADE PYELOGRAM, BALLOON [place]  URETEROSCOPY AND STENT PLACEMENT;  Surgeon: Cleon Gustin, MD;  Location: [facility];  Service: Urology;  Laterality: Right;   CYSTOSCOPY WITH STENT PLACEMENT Right 11/21/2018   Procedure: CYSTOSCOPY, RIGHT RETROGRADE PYELOGRAM, WITH RIGHT URETERAL STENT PLACEMENT;  Surgeon: Cleon Gustin, MD;  Location: [facility];  Service: Urology;  Laterality: Right;   IR IMAGING GUIDED PORT INSERTION  09/04/2020   IR NEPHROSTOMY PLACEMENT RIGHT  06/22/2019   IR PATIENT EVAL TECH 0-60 MINS  06/28/2019   IR RADIOLOGIST EVAL & MGMT  06/06/2020   IR RADIOLOGIST EVAL & MGMT  07/17/2020   IR RADIOLOGIST EVAL & MGMT  08/15/2020   IR RADIOLOGIST EVAL & MGMT  09/03/2020   IR RADIOLOGIST EVAL & MGMT  12/18/2020   IR RADIOLOGIST EVAL & MGMT  02/25/2021   IR RADIOLOGIST EVAL & MGMT  06/23/2021   left forearm surgery due to calcium rock      RADIOLOGY WITH ANESTHESIA N/A 06/19/2020   Procedure: CT WITH ANESTHESIA MICROWAVE ABLATION;  Surgeon: Jacqulynn Cadet, MD;  Location: [facility];  Service: Anesthesiology;  Laterality: N/A;   RADIOLOGY WITH ANESTHESIA N/A  07/30/2021   Procedure: CT MICROWAVE ABLATION OF THE LIVER;  Surgeon: Criselda Peaches, MD;  Location: [facility];  Service: Radiology;  Laterality: N/A;   ROBOT ASSISTED PYELOPLASTY Right 08/03/2019   Procedure: XI ROBOTIC ASSISTED PYELOPLASTY WITH STENT PLACEMENT;  Surgeon: Cleon Gustin, MD;  Location: [facility];  Service: Urology;  Laterality: Right;  3 HRS   SHOULDER ARTHROSCOPY W/ ROTATOR CUFF REPAIR Right    XI ROBOTIC ASSISTED LOWER ANTERIOR RESECTION N/A 09/16/2018   Procedure: XI ROBOTIC ASSISTED LOWER ANTERIOR RESECTION ERAS PATHWAY;  Surgeon: Leighton Ruff, MD;  Location: [facility];  Service: General;  Laterality: N/A;    Allergies: Prednisone and Codeine  Medications: Prior to Admission medications   Medication Sig Start Date End Date Taking? Authorizing Provider  alprazolam Duanne Moron) 2 MG tablet Take 2 mg by mouth every morning.     [provider]  Ascorbic Acid (VITAMIN C) 1000 MG tablet Take 1,000 mg by mouth daily. Patient not taking: Reported on 08/08/2021    [provider]  aspirin EC 81 MG tablet Take 81 mg by mouth every morning. Swallow whole.     [provider]  Biotin 10 MG CAPS Take 10 mg by mouth daily. Patient not taking: Reported on 08/08/2021    [provider]  buPROPion (WELLBUTRIN XL) 300 MG 24 hr tablet Take 300 mg by mouth every morning.     [provider]  calcium carbonate (OSCAL) 1500 (600 Ca) MG TABS tablet Take 600 mg of elemental calcium by mouth daily. Patient not taking: Reported on 08/08/2021    [provider]  Cholecalciferol (DIALYVITE VITAMIN D 5000) 125 MCG (5000 UT) capsule Take 5,000 Units by mouth daily. Patient not taking: Reported on 08/08/2021    [provider]  cyclobenzaprine (FLEXERIL) 10 MG tablet Take 20 mg by mouth at bedtime.    [provider]  diphenoxylate-atropine (LOMOTIL) 2.5-0.025 MG tablet TAKE 1 TO 2 TABLETS BY MOUTH FOUR TIMES DAILY AS NEEDED FOR  DIARRHEA OR LOOSE STOOLS Patient not taking: No sig reported 03/10/21   Owens Shark, NP  [facility] C PO Take 1 tablet by mouth daily. Patient not taking: Reported on 08/08/2021    [provider]  HYDROcodone-acetaminophen (NORCO) 10-325 MG tablet Take 1-2 tablets by mouth every 6 (six) hours as needed for moderate pain or severe pain (headache). Post-Operatively. 08/05/19   Alexis Frock, MD  lidocaine-prilocaine (EMLA) cream Apply 1 application topically as directed. Apply 1-2 hours prior to IV stick and cover with plastic wrap 11/28/20   Ladell Pier, MD  lubiprostone (AMITIZA) 24 MCG capsule Take 1 capsule (24 mcg total) by mouth 2 (two) times daily with a meal. 07/16/21 01/07/23  Cirigliano, Vito V, DO  METAMUCIL FIBER PO Take 1 capsule by mouth daily.    [provider]  Methylcellulose, Laxative, (CITRUCEL PO) Take 1 tablet by mouth daily.    [provider]  Multiple Vitamin (MULTIVITAMIN WITH MINERALS) TABS tablet Take 1 tablet by mouth daily. Patient not taking: Reported on 08/08/2021    [provider]  Multiple Vitamins-Minerals (IMMUNE SUPPORT PO) Take 1 tablet by mouth daily. Patient not taking: Reported on 08/08/2021    [provider]  Na Sulfate-K Sulfate-Mg Sulf 17.5-3.13-1.6 GM/177ML SOLN Take 1 kit by mouth as directed. May use generic Suprep Patient not taking: Reported on 08/08/2021 07/31/21   Cirigliano, Dominic Pea, DO  Omega-3 Fatty Acids (FISH OIL) 1000 MG CAPS Take 1,000 mg by mouth daily. Patient not taking: Reported on 08/08/2021    [provider]  oxyCODONE-acetaminophen (PERCOCET/ROXICET) 5-325 MG tablet Take 1-2 tablets by mouth every 6 (six) hours as needed for severe pain. 08/07/21   McKenzie, Candee Furbish, MD  Prenatal Vit-Fe Fumarate-FA (PRENATAL PO) Take 1 tablet by mouth daily.    [provider]  Probiotic Product (PROBIOTIC PO) Take 1 tablet by mouth every morning. Acidophilus 100  million Patient not taking: Reported on 08/08/2021    [provider]  rOPINIRole (REQUIP) 1 MG tablet Take 1 mg by mouth at bedtime. Patient not taking: Reported on 08/08/2021    [provider]  TURMERIC PO Take 1 tablet by mouth daily. Patient not taking: Reported on 08/08/2021    [provider]  vitamin B-12 (CYANOCOBALAMIN) 1000 MCG tablet Take 1,000 mcg by mouth daily. Patient not taking: Reported on 08/08/2021    [provider]     Family History  Problem Relation Age of Onset   COPD Father        smoker   Stomach cancer Father        spread to lungs and bones   Arthritis Mother    Heart attack Paternal Grandmother    Heart attack Paternal Grandfather    Heart  attack Maternal Grandmother    Stroke Maternal Grandmother    Colon cancer Neg Hx    Rectal cancer Neg Hx    Esophageal cancer Neg Hx     Social History   Socioeconomic History   Marital status: Legally Separated    Spouse name: Not on file   Number of children: 1   Years of education: Not on file   Highest education level: Not on file  Occupational History   Occupation: Land  Tobacco Use   Smoking status: Former    Packs/day: 1.00    Years: 30.00    Pack years: 30.00    Types: Cigarettes    Quit date: 09/16/2018    Years since quitting: 2.9   Smokeless tobacco: Never  Vaping Use   Vaping Use: Never used  Substance and Sexual Activity   Alcohol use: Yes    Comment: social   Drug use: Never   Sexual activity: Yes  Other Topics Concern   Not on file  Social History Narrative   Not on file   Social Determinants of Health   Financial Resource Strain: Not on file  Food Insecurity: Not on file  Transportation Needs: Not on file  Physical Activity: Not on file  Stress: Not on file  Social Connections: Not on file    ECOG Status: 1 - Symptomatic but completely ambulatory  Review of Systems  Review of Systems: A 12 point ROS discussed and pertinent  positives are indicated in the HPI above.  All other systems are negative.  Physical Exam No direct physical exam was performed (except for noted visual exam findings with Video Visits).    Vital Signs: LMP  (LMP Unknown)   Imaging: CT ABDOMEN PELVIS WO CONTRAST  Result Date: 07/31/2021 CLINICAL DATA:  Lower abdominal pain with acute renal failure. Suprapubic pain. 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 EXAM: CT ABDOMEN AND PELVIS WITHOUT CONTRAST TECHNIQUE: Multidetector CT imaging of the abdomen and pelvis was performed following the standard protocol without IV contrast. COMPARISON:  06/17/2021 FINDINGS: Lower chest: Basilar atelectasis bilaterally. Hepatobiliary: Ablation defect noted posterior right liver, more pronounced than on the prior study with posterior high attenuation material likely treatment related or small focus of hemorrhage. Similar appearance of reported ablation defect lateral segment left liver. The liver shows diffusely decreased attenuation suggesting fat deposition. Tiny hyperenhancing lesion medial segment left liver seen on the previous study is not evident on today's exam. High attenuation material in the gallbladder lumen is compatible with vicarious excretion of contrast material. Gallstones evident. No intrahepatic or extrahepatic biliary dilation. Pancreas: No focal mass lesion. No dilatation of the main duct. No intraparenchymal cyst. No peripancreatic edema. Spleen: No splenomegaly. No focal mass lesion. Adrenals/Urinary Tract: No adrenal nodule or mass. Contrast excretion of both kidneys compatible with contrast material from yesterday's CT scan. No evidence for hydroureter. The urinary bladder appears normal for the degree of distention. Stomach/Bowel: Stomach is moderately distended with food and fluid. Duodenum is normally positioned as is the ligament of Treitz. No small bowel wall thickening. No small bowel dilatation. The terminal ileum is normal. The  appendix is not well visualized, but there is no edema or inflammation in the region of the cecum. Prominent stool volume right and transverse colon. Suture line noted in the rectum. Vascular/Lymphatic: No abdominal aortic aneurysm. No abdominal lymphadenopathy. No pelvic sidewall lymphadenopathy. Reproductive: No adnexal mass. Other: Since yesterday's study, there is moderate volume high attenuation free fluid in the abdomen  and pelvis, seen around the liver and spleen, within the mesentery, and in the cul-de-sac. Attenuation of the fluid is higher than would be expected for serous fluid suggesting hemoperitoneum or opacified fluid. Of note, there is fairly high attenuation fluid in the left lateral pelvis adjacent to the acetabulum and in the posterior cul-de-sac to the right of the rectum. Musculoskeletal: No worrisome lytic or sclerotic osseous abnormality. L5-S1 spondylolisthesis is similar to prior. IMPRESSION: 1. Interval development of moderate volume high attenuation free fluid in the abdomen and pelvis. Attenuation of the fluid is higher than would be expected for serous fluid. I discussed this case with Dr. Laurence Ferrari at the time of interpretation. Of note, the patient developed progressive right-sided hydronephrosis and proximal hydroureter during the 1 hour ablation procedure yesterday, well demonstrated on localization scans performed on that study. On today's study, the right ureter can be seen to have an ectopic insertion high on the posterior dome of the bladder. The distal right ureter just proximal to the UVJ is irregular and there appears to be a string like subtle band of extravasated urine adjacent to the right UVJ. As such, these findings are concerning for distal right ureteral injury and ureteral leak as the etiology of the new intraperitoneal fluid. Given that the free fluid is intraperitoneal, this would suggest that the right UVJ is not retroperitoneal in location. However, the patient  was already excreting contrast into the collecting systems and ureters by the end of yesterday's study and the intraperitoneal fluid today is not substantially opacified, possibly secondary to some reabsorption of the contrast material via the peritoneum although a component of hemoperitoneum could also have this appearance and cannot be entirely excluded on this exam. 2. Posterior right hepatic lobe ablation defect is increased in size in the interval, consistent with retreatment. Similar appearance of ablation defect in the lateral segment left liver. 3. Cholelithiasis. 4. Prominent stool volume right and transverse colon. Imaging features could be compatible with clinical constipation. 5. L5-S1 spondylolisthesis, similar to prior. Electronically Signed   By: Misty Stanley M.D.   On: 07/31/2021 15:53   DG Retrograde Pyelogram  Result Date: 08/01/2021 CLINICAL DATA:  Distal right ureteral injury from traumatic Foley catheter insertion. EXAM: INTRAOPERATIVE RIGHT RETROGRADE UROGRAPHY TECHNIQUE: Images were obtained with the C-arm fluoroscopic device intraoperatively and submitted for interpretation post-operatively. Please see the procedural report for the amount of contrast and the fluoroscopy time utilized. COMPARISON:  CT of the abdomen and pelvis on 07/31/2021 FINDINGS: Intraoperative imaging demonstrates cannulation of the right ureter with contrast injection demonstrating probable distal ureteral injury. A guidewire was advanced to the level of the collecting system and a ureteral stent placed. IMPRESSION: Retrograde urography demonstrating distal right ureteral injury. A right ureteral stent was placed. Electronically Signed   By: Aletta Edouard M.D.   On: 08/01/2021 07:56   US RENAL  Result Date: 07/31/2021 CLINICAL DATA:  Right hydronephrosis EXAM: RENAL / URINARY TRACT ULTRASOUND COMPLETE COMPARISON:  None. FINDINGS: Right Kidney: Renal measurements: 9.8 x 5.6 x 6.0 cm = volume: 172 mL.  Echogenicity within normal limits. No mass or hydronephrosis visualized. Left Kidney: Renal measurements: 9.9 x 5.0 x 4.7 cm = volume: 121 mL. Echogenicity within normal limits. No mass or hydronephrosis visualized. Bladder: Appears normal for degree of bladder distention. Other: Small amount of free fluid seen in the left flank. IMPRESSION: No hydronephrosis. Small amount of free fluid seen in the left flank. Electronically Signed   By: Hosie Poisson.D.  On: 07/31/2021 10:01   CT GUIDE TISSUE ABLATION  Result Date: 07/30/2021 INDICATION: 60 year old female with a history of rectal adenocarcinoma with hepatic metastatic disease. She has imaging and biochemical evidence of marginal recurrence at the previously treated ablation site in hepatic segment 6. She presents today for biopsy and repeat percutaneous thermal ablation. EXAM: 1. Ultrasound-guided biopsy hepatic lesion 2. CT and ultrasound-guided percutaneous microwave ablation COMPARISON:  CT abdomen/pelvis 06/17/2021 MEDICATIONS: None. ANESTHESIA/SEDATION: General - as administered by the Anesthesia department FLUOROSCOPY TIME:  None. COMPLICATIONS: None immediate. TECHNIQUE: Informed written consent was obtained from the patient after a thorough discussion of the procedural risks, benefits and alternatives. All questions were addressed. Maximal Sterile Barrier Technique was utilized including caps, mask, sterile gowns, sterile gloves, sterile drape, hand hygiene and skin antiseptic. A timeout was performed prior to the initiation of the procedure. The patient was placed on the CT gantry. Initial nonenhanced CT imaging was performed. The prior ablation defect is visible. Unfortunately, the liver lesion is essentially occult by noncontrast enhanced CT imaging. Therefore, additional CT imaging was performed with intravenous contrast. This confirms the presence of the mass which has enlarged compared to the relatively recent prior imaging. The mass now  measures approximately 3 x 3 x 3 cm. The right upper quadrant was also interrogated with ultrasound. The mass is readily visible by ultrasound. First, biopsy was performed. A suitable skin entry site was selected and marked. A small dermatotomy was made. Under real-time ultrasound guidance, an 18 gauge trocar needle was advanced into the margin the mass. Multiple 18 gauge core biopsies were then obtained coaxially using a bio Pince automated biopsy device. Biopsy specimens were placed in formalin and delivered to pathology for further analysis. Next, 2 NeuWave PR XT probes were selected. The probes were then advanced and positioned within the cephalad and more caudal aspects of the lesion in parallel separated by approximately 1.5 cm. Following adequate placement of the probes, thermal ablation was then performed. Both probes were powered at 65 watts in the ablation was continued for approximately 6 minutes. The ablation zone was monitored both by ultrasound and also CT imaging at approximately 5 minutes. The ablation zone appears to encompass the entirety of the tumor. Following successful ablation, the probes were removed. Repeat CT imaging was then performed with and without intravenous contrast. Adequate ablation of the region of tumor. No evidence of immediate complication. FINDINGS: Successful percutaneous tumor ablation without evidence of complication. IMPRESSION: 1. Ultrasound-guided core biopsy of presumed recurrent liver metastasis. 2. Successful percutaneous thermal ablation. Electronically Signed   By: Jacqulynn Cadet M.D.   On: 07/30/2021 12:10   CT BIOPSY  Result Date: 07/30/2021 INDICATION: 60 year old female with a history of rectal adenocarcinoma with hepatic metastatic disease. She has imaging and biochemical evidence of marginal recurrence at the previously treated ablation site in hepatic segment 6. She presents today for biopsy and repeat percutaneous thermal ablation. EXAM: 1.  Ultrasound-guided biopsy hepatic lesion 2. CT and ultrasound-guided percutaneous microwave ablation COMPARISON:  CT abdomen/pelvis 06/17/2021 MEDICATIONS: None. ANESTHESIA/SEDATION: General - as administered by the Anesthesia department FLUOROSCOPY TIME:  None. COMPLICATIONS: None immediate. TECHNIQUE: Informed written consent was obtained from the patient after a thorough discussion of the procedural risks, benefits and alternatives. All questions were addressed. Maximal Sterile Barrier Technique was utilized including caps, mask, sterile gowns, sterile gloves, sterile drape, hand hygiene and skin antiseptic. A timeout was performed prior to the initiation of the procedure. The patient was placed on the CT gantry. Initial  nonenhanced CT imaging was performed. The prior ablation defect is visible. Unfortunately, the liver lesion is essentially occult by noncontrast enhanced CT imaging. Therefore, additional CT imaging was performed with intravenous contrast. This confirms the presence of the mass which has enlarged compared to the relatively recent prior imaging. The mass now measures approximately 3 x 3 x 3 cm. The right upper quadrant was also interrogated with ultrasound. The mass is readily visible by ultrasound. First, biopsy was performed. A suitable skin entry site was selected and marked. A small dermatotomy was made. Under real-time ultrasound guidance, an 18 gauge trocar needle was advanced into the margin the mass. Multiple 18 gauge core biopsies were then obtained coaxially using a bio Pince automated biopsy device. Biopsy specimens were placed in formalin and delivered to pathology for further analysis. Next, 2 NeuWave PR XT probes were selected. The probes were then advanced and positioned within the cephalad and more caudal aspects of the lesion in parallel separated by approximately 1.5 cm. Following adequate placement of the probes, thermal ablation was then performed. Both probes were powered at 65  watts in the ablation was continued for approximately 6 minutes. The ablation zone was monitored both by ultrasound and also CT imaging at approximately 5 minutes. The ablation zone appears to encompass the entirety of the tumor. Following successful ablation, the probes were removed. Repeat CT imaging was then performed with and without intravenous contrast. Adequate ablation of the region of tumor. No evidence of immediate complication. FINDINGS: Successful percutaneous tumor ablation without evidence of complication. IMPRESSION: 1. Ultrasound-guided core biopsy of presumed recurrent liver metastasis. 2. Successful percutaneous thermal ablation. Electronically Signed   By: Jacqulynn Cadet M.D.   On: 07/30/2021 12:10   DG Abd Portable 1V  Result Date: 08/03/2021 CLINICAL DATA:  History of metastatic rectal cancer. Persistent lower abdominal/suprapubic discomfort EXAM: PORTABLE ABDOMEN - 1 VIEW COMPARISON:  CT scan 07/31/2021 FINDINGS: Right double-J ureteral stent is in good position without obvious complicating features. The abdominal bowel gas pattern is unremarkable. Scattered air-filled loops of small bowel in the upper central abdomen no distension. Moderate stool throughout the colon and down into the rectum. No findings for obstruction or perforation. The soft tissue shadows are grossly maintained. No worrisome calcifications. IMPRESSION: 1. Right double-J ureteral stent in good position. 2. Unremarkable bowel gas pattern. Electronically Signed   By: Marijo Sanes M.D.   On: 08/03/2021 15:29    Labs:  CBC: Recent Labs    07/15/21 1342 07/31/21 0527 08/01/21 0457 08/02/21 0534  WBC 5.6 12.5* 10.0 7.2  HGB 13.6 12.4 12.2 10.7*  HCT 42.1 38.6 38.3 33.1*  PLT 257 228 196 182    COAGS: Recent Labs    07/15/21 1342  INR 1.1    BMP: Recent Labs    07/31/21 0528 08/01/21 0457 08/01/21 1058 08/02/21 0534  NA 135 134* 137 139  K 5.1 4.9 4.8 3.8  CL 103 104 106 110  CO2 23 21*  23 26  GLUCOSE 146* 171* 144* 122*  BUN 42* 40* 38* 27*  CALCIUM 8.7* 8.7* 9.0 8.5*  CREATININE 2.75* 3.04* 2.05* 1.00  GFRNONAA 20* 17* 28* >60    LIVER FUNCTION TESTS: Recent Labs    10/30/20 1054 11/14/20 0908 11/28/20 0812 07/15/21 1342 08/01/21 0457 08/01/21 1058 08/02/21 0534  BILITOT 0.5 0.4 0.5 0.6  --   --   --   AST 38 37 39 35  --   --   --   ALT  51* 54* 57* 39  --   --   --   ALKPHOS 135* 138* 133* 97  --   --   --   PROT 7.4 6.9 7.0 7.7  --   --   --   ALBUMIN 3.7 3.6 3.6 3.9 3.4* 3.8 3.1*    TUMOR MARKERS: Recent Labs    04/15/21 1417 06/17/21 1238 08/08/21 1130  CEA 2.69 7.03* 3.05    Assessment and Plan:  Mrs. Dewalt is doing as well as can be expected following her recent percutaneous microwave ablation.  We spent a good deal of time on the phone today and I explained to her how we were able to identify and diagnose the unfortunate injury to her reimplanted right ureter which must of been sustained at the time of Foley catheter placement.  This is somewhat of a freak incident as Foley catheter placement and removal were performed in the routine fashion.  She is following up with Dr. Alyson Ingles of urology on Friday.  Fortunately, her renal function has returned to normal.  We will continue active surveillance.  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  We should have enough tissue for additional molecular testing if this is felt to be helpful.  Her most recent CEA has returned to baseline.  1.) CT Chest without and Abd/pelvis with along with clinic visit in mid December or early January.   Electronically Signed: Criselda Peaches 08/12/2021, 12:30 PM   I spent a total of  15 Minutes in remote  clinical consultation, greater than 50% of which was counseling/coordinating care for colorectal cancer mets to liver.    Visit type: Audio only (telephone). Audio (no video) only due to patient preference. Alternative for in-person  consultation at [facility], [address], [place], [place]. This visit type was conducted due to national recommendations for restrictions regarding the COVID-19 Pandemic (e.g. social distancing).  This format is felt to be most appropriate for this patient at this time.  All issues noted in this document were discussed and addressed.

## 2021-08-14 NOTE — Telephone Encounter (Signed)
No return call received. Letter has been mailed and sent to patient via my chart to contact the office at her convenience to schedule a follow up with Dr. Bryan Lemma in January 2023.

## 2021-08-15 ENCOUNTER — Other Ambulatory Visit: Payer: Self-pay

## 2021-08-15 ENCOUNTER — Ambulatory Visit (INDEPENDENT_AMBULATORY_CARE_PROVIDER_SITE_OTHER): Payer: 59 | Admitting: Urology

## 2021-08-15 ENCOUNTER — Encounter: Payer: Self-pay | Admitting: Urology

## 2021-08-15 VITALS — BP 122/83 | HR 102 | Temp 98.6°F

## 2021-08-15 DIAGNOSIS — N135 Crossing vessel and stricture of ureter without hydronephrosis: Secondary | ICD-10-CM

## 2021-08-15 MED ORDER — MIRABEGRON ER 25 MG PO TB24: 25.0000 mg | ORAL_TABLET | Freq: Every day | ORAL | 0 refills | Status: DC

## 2021-08-15 MED ORDER — CLOTRIMAZOLE-BETAMETHASONE 1-0.05 % EX CREA: 1.0000 "application " | TOPICAL_CREAM | Freq: Two times a day (BID) | CUTANEOUS | 1 refills | Status: DC

## 2021-08-15 NOTE — Patient Instructions (Signed)
Hydronephrosis ?Hydronephrosis is the swelling of one or both kidneys due to a blockage that stops urine from flowing out of the body. Kidneys filter waste from the blood and produce urine. This condition can lead to kidney failure and may become life-threatening if not treated promptly. ?What are the causes? ?In infants and children, common causes include problems that occur when a baby is developing in the womb. These can include problems in the kidneys or in the tubes that drain urine into the bladder (ureters). ?In adults, common causes include: ?Kidney stones. ?Pregnancy. ?A tumor or cyst in the abdomen or pelvis. ?An enlarged prostate gland. ?Other causes include: ?Bladder infection. ?Scar tissue from a previous surgery or injury. ?A blood clot. ?Cancer of the prostate, bladder, uterus, ovary, or colon. ?What are the signs or symptoms? ?Symptoms of this condition include: ?Pain or discomfort in your side (flank) or abdomen. ?Swelling in your abdomen. ?Nausea and vomiting. ?Fever. ?Pain when passing urine. ?Feelings of urgency when you need to urinate. ?Urinating more often than normal. ?In some cases, you may not have any symptoms. ?How is this diagnosed? ?This condition may be diagnosed based on: ?Your symptoms and medical history. ?A physical exam. ?Blood and urine tests. ?Imaging tests, such as an ultrasound, CT scan, or MRI. ?A procedure to look at your urinary tract and bladder by inserting a scope into the urethra (cystoscopy). ?How is this treated? ?Treatment for this condition depends on where the blockage is, how long it has been there, and what caused it. The goal of treatment is to remove the blockage. Treatment may include: ?Antibiotic medicines to treat or prevent infection. ?A procedure to place a small, thin tube (stent) into a blocked ureter. The stent will keep the ureter open so that urine can drain through it. ?A nonsurgical procedure that crushes kidney stones with shock waves  (extracorporeal shock wave lithotripsy). ?If kidney failure occurs, treatment may include dialysis or a kidney transplant. ?Follow these instructions at home: ? ?Take over-the-counter and prescription medicines only as told by your health care provider. ?If you were prescribed an antibiotic medicine, take it exactly as told by your health care provider. Do not stop taking the antibiotic even if you start to feel better. ?Rest and return to your normal activities as told by your health care provider. Ask your health care provider what activities are safe for you. ?Drink enough fluid to keep your urine pale yellow. ?Keep all follow-up visits. This is important. ?Contact a health care provider if: ?You continue to have symptoms after treatment. ?You develop new symptoms. ?Your urine becomes cloudy or bloody. ?You have a fever. ?Get help right away if: ?You have severe flank or abdominal pain. ?You cannot drink fluids without vomiting. ?Summary ?Hydronephrosis is the swelling of one or both kidneys due to a blockage that stops urine from flowing out of the body. ?Hydronephrosis can lead to kidney failure and may become life-threatening if not treated promptly. ?The goal of treatment is to remove the blockage. It may include a procedure to insert a stent into a blocked ureter, a procedure to break up kidney stones, or taking antibiotic medicines. ?Follow your health care provider's instructions for taking care of yourself at home, including instructions about drinking fluids, taking medicines, and limiting activities. ?This information is not intended to replace advice given to you by your health care provider. Make sure you discuss any questions you have with your health care provider. ?Document Revised: 01/09/2020 Document Reviewed: 01/09/2020 ?Elsevier Patient   Education ? 2022 [employer] ? ?

## 2021-08-15 NOTE — Progress Notes (Signed)
 08/15/2021 11:13 AM   Norma Colon 06/29/1961 [id number]  Referring provider: Grewal, Michelle, MD [address] [place],  [place] [address]  Followup right ureteral injury   HPI: Norma Colon is a 60yo here for evaluation of right ureteral injury. She had a right ureteral reimplant with psoas hitch 07/2019. She underwent ablation of her liver lesions 3 weeks ago and when the foley catheter was placed it was inflated in the right ureter. She then underwent urgent right ureteral stent placement with Dr. Gay on 10/27. She is having suprapubic pain and pain with urination with the stent in place.    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 sherrill--  per last note in epic ,  clinical remission   dx 08/ 2019---- chemoradiation concurrent completed 07-20-2018;  s/p  low anterior resection 09-16-2018;   chemo 10-17-2018  ot 12-19-2018   Anxiety    Arthritis    Chemotherapy induced neutropenia (HCC)    Depression    Headache    migraines    History of HPV infection    History of kidney stones    Hydronephrosis, right    Restless leg syndrome    Scoliosis    Sepsis (HCC)    11-2018    Surgical History: Past Surgical History:  Procedure Laterality Date   COLONOSCOPY     COLPOSCOPY  04/04/2018   CYSTOSCOPY W/ RETROGRADES Right 07/31/2021   Procedure: CYSTOSCOPY WITH RETROGRADE PYELOGRAM, URETEROSCOPY AND STENT PLACEMENT;  Surgeon: Gay, Matthew R, MD;  Location: [facility];  Service: Urology;  Laterality: Right;   CYSTOSCOPY WITH RETROGRADE PYELOGRAM, URETEROSCOPY AND STENT PLACEMENT Right 12/12/2018   Procedure: CYSTOSCOPY WITH RETROGRADE PYELOGRAM, URETEROSCOPY, STONE EXTRACTION AND STENT PLACEMENT;  Surgeon: McKenzie, Patrick L, MD;  Location: [facility];  Service: Urology;  Laterality: Right;   CYSTOSCOPY WITH RETROGRADE PYELOGRAM, URETEROSCOPY AND STENT PLACEMENT Right 04/17/2019   Procedure:  CYSTOSCOPY WITH RETROGRADE PYELOGRAM, BALLOON DILITATION,DIAGNOSTIC  URETEROSCOPY AND STENT PLACEMENT;  Surgeon: McKenzie, Patrick L, MD;  Location: [facility];  Service: Urology;  Laterality: Right;   CYSTOSCOPY WITH STENT PLACEMENT Right 11/21/2018   Procedure: CYSTOSCOPY, RIGHT RETROGRADE PYELOGRAM, WITH RIGHT URETERAL STENT PLACEMENT;  Surgeon: McKenzie, Patrick L, MD;  Location: [facility];  Service: Urology;  Laterality: Right;   IR IMAGING GUIDED PORT INSERTION  09/04/2020   IR NEPHROSTOMY PLACEMENT RIGHT  06/22/2019   IR PATIENT EVAL TECH 0-60 MINS  06/28/2019   IR RADIOLOGIST EVAL & MGMT  06/06/2020   IR RADIOLOGIST EVAL & MGMT  07/17/2020   IR RADIOLOGIST EVAL & MGMT  08/15/2020   IR RADIOLOGIST EVAL & MGMT  09/03/2020   IR RADIOLOGIST EVAL & MGMT  12/18/2020   IR RADIOLOGIST EVAL & MGMT  02/25/2021   IR RADIOLOGIST EVAL & MGMT  06/23/2021   IR RADIOLOGIST EVAL & MGMT  08/12/2021   left forearm surgery due to calcium rock      RADIOLOGY WITH ANESTHESIA N/A 06/19/2020   Procedure: CT WITH ANESTHESIA MICROWAVE ABLATION;  Surgeon: McCullough, Heath, MD;  Location: [facility];  Service: Anesthesiology;  Laterality: N/A;   RADIOLOGY WITH ANESTHESIA N/A 07/30/2021   Procedure: CT MICROWAVE ABLATION OF THE LIVER;  Surgeon: McCullough, Heath K, MD;  Location: [facility];  Service: Radiology;  Laterality: N/A;   ROBOT ASSISTED PYELOPLASTY Right 08/03/2019   Procedure: XI ROBOTIC ASSISTED PYELOPLASTY WITH STENT PLACEMENT;  Surgeon: McKenzie, Patrick L, MD;    Location: [facility];  Service: Urology;  Laterality: Right;  3 HRS   SHOULDER ARTHROSCOPY W/ ROTATOR CUFF REPAIR Right    XI ROBOTIC ASSISTED LOWER ANTERIOR RESECTION N/A 09/16/2018   Procedure: XI ROBOTIC ASSISTED LOWER ANTERIOR RESECTION ERAS PATHWAY;  Surgeon: Leighton Ruff, MD;  Location: [facility];  Service: General;  Laterality: N/A;    Home Medications:  Allergies as of 08/15/2021       Reactions   Prednisone Other (See  Comments)   "Felt like she was crazy" if on it more than 1 week   Codeine Other (See Comments)   Causes migraine--patient requests this be kept on allergy list        Medication List        Accurate as of August 15, 2021 11:13 AM. If you have any questions, ask your nurse or doctor.          alprazolam 2 MG tablet Commonly known as: XANAX Take 2 mg by mouth every morning.   aspirin EC 81 MG tablet Take 81 mg by mouth every morning. Swallow whole.   Biotin 10 MG Caps Take 10 mg by mouth daily.   buPROPion 300 MG 24 hr tablet Commonly known as: WELLBUTRIN XL Take 300 mg by mouth every morning.   calcium carbonate 1500 (600 Ca) MG Tabs tablet Commonly known as: OSCAL Take 600 mg of elemental calcium by mouth daily.   CITRUCEL PO Take 1 tablet by mouth daily.   cyclobenzaprine 10 MG tablet Commonly known as: FLEXERIL Take 20 mg by mouth at bedtime.   Dialyvite Vitamin D 5000 125 MCG (5000 UT) capsule Generic drug: Cholecalciferol Take 5,000 Units by mouth daily.   diphenoxylate-atropine 2.5-0.025 MG tablet Commonly known as: LOMOTIL TAKE 1 TO 2 TABLETS BY MOUTH FOUR TIMES DAILY AS NEEDED FOR DIARRHEA OR LOOSE STOOLS   ECHINACEA-ZINC-VITAMIN C PO Take 1 tablet by mouth daily.   Fish Oil 1000 MG Caps Take 1,000 mg by mouth daily.   HYDROcodone-acetaminophen 10-325 MG tablet Commonly known as: NORCO Take 1-2 tablets by mouth every 6 (six) hours as needed for moderate pain or severe pain (headache). Post-Operatively.   IMMUNE SUPPORT PO Take 1 tablet by mouth daily.   lidocaine-prilocaine cream Commonly known as: EMLA Apply 1 application topically as directed. Apply 1-2 hours prior to IV stick and cover with plastic wrap   lubiprostone 24 MCG capsule Commonly known as: AMITIZA Take 1 capsule (24 mcg total) by mouth 2 (two) times daily with a meal.   METAMUCIL FIBER PO Take 1 capsule by mouth daily.   multivitamin with minerals Tabs tablet Take 1  tablet by mouth daily.   Na Sulfate-K Sulfate-Mg Sulf 17.5-3.13-1.6 GM/177ML Soln Take 1 kit by mouth as directed. May use generic Suprep   oxyCODONE-acetaminophen 5-325 MG tablet Commonly known as: PERCOCET/ROXICET Take 1-2 tablets by mouth every 6 (six) hours as needed for severe pain.   PRENATAL PO Take 1 tablet by mouth daily.   PROBIOTIC PO Take 1 tablet by mouth every morning. Acidophilus 100 million   rOPINIRole 1 MG tablet Commonly known as: REQUIP Take 1 mg by mouth at bedtime.   TURMERIC PO Take 1 tablet by mouth daily.   vitamin B-12 1000 MCG tablet Commonly known as: CYANOCOBALAMIN Take 1,000 mcg by mouth daily.   vitamin C 1000 MG tablet Take 1,000 mg by mouth daily.        Allergies:  Allergies  Allergen Reactions   Prednisone Other (See Comments)    "  Felt like she was crazy" if on it more than 1 week   Codeine Other (See Comments)    Causes migraine--patient requests this be kept on allergy list    Family History: Family History  Problem Relation Age of Onset   COPD Father        smoker   Stomach cancer Father        spread to lungs and bones   Arthritis Mother    Heart attack Paternal Grandmother    Heart attack Paternal Grandfather    Heart attack Maternal Grandmother    Stroke Maternal Grandmother    Colon cancer Neg Hx    Rectal cancer Neg Hx    Esophageal cancer Neg Hx     Social History:  reports that she quit smoking about 2 years ago. Her smoking use included cigarettes. She has a 30.00 pack-year smoking history. She has never used smokeless tobacco. She reports current alcohol use. She reports that she does not use drugs.  ROS: All other review of systems were reviewed and are negative except what is noted above in HPI  Physical Exam: BP 122/83   Pulse (!) 102   Temp 98.6 F (37 C)   LMP  (LMP Unknown)   Constitutional:  Alert and oriented, No acute distress. HEENT: [place] AT, moist mucus membranes.  Trachea midline, no  masses. Cardiovascular: No clubbing, cyanosis, or edema. Respiratory: Normal respiratory effort, no increased work of breathing. GI: Abdomen is soft, nontender, nondistended, no abdominal masses GU: No CVA tenderness.  Lymph: No cervical or inguinal lymphadenopathy. Skin: No rashes, bruises or suspicious lesions. Neurologic: Grossly intact, no focal deficits, moving all 4 extremities. Psychiatric: Normal mood and affect.  Laboratory Data: Lab Results  Component Value Date   WBC 7.2 08/02/2021   HGB 10.7 (L) 08/02/2021   HCT 33.1 (L) 08/02/2021   MCV 96.5 08/02/2021   PLT 182 08/02/2021    Lab Results  Component Value Date   CREATININE 1.00 08/02/2021    No results found for: PSA  No results found for: TESTOSTERONE  No results found for: HGBA1C  Urinalysis    Component Value Date/Time   COLORURINE YELLOW 08/01/2021 1027   APPEARANCEUR CLEAR 08/01/2021 1027   LABSPEC 1.020 08/01/2021 1027   PHURINE 5.0 08/01/2021 1027   GLUCOSEU NEGATIVE 08/01/2021 1027   HGBUR LARGE (A) 08/01/2021 1027   BILIRUBINUR NEGATIVE 08/01/2021 1027   KETONESUR NEGATIVE 08/01/2021 1027   PROTEINUR 30 (A) 08/01/2021 1027   NITRITE NEGATIVE 08/01/2021 1027   LEUKOCYTESUR MODERATE (A) 08/01/2021 1027    Lab Results  Component Value Date   BACTERIA RARE (A) 08/01/2021    Pertinent Imaging:  Results for orders placed during the hospital encounter of 01/02/20  DG Abd 1 View  Narrative CLINICAL DATA:  Cyst marker capsules swallowed 12/25/2019  EXAM: ABDOMEN - 1 VIEW  COMPARISON:  None.  FINDINGS Capsule markers overlie the descending colon and sigmoid colon. Bowel gas pattern is unremarkable.  IMPRESSION: Capsule markers likely within descending and sigmoid colon.   Electronically Signed By: Praneil  Patel M.D. On: 01/02/2020 15:52  No results found for this or any previous visit.  No results found for this or any previous visit.  No results found for this or any  previous visit.  Results for orders placed during the hospital encounter of 07/30/21  US RENAL  Narrative CLINICAL DATA:  Right hydronephrosis  EXAM: RENAL / URINARY TRACT ULTRASOUND COMPLETE  COMPARISON:  None.  FINDINGS: Right   Kidney:  Renal measurements: 9.8 x 5.6 x 6.0 cm = volume: 172 mL. Echogenicity within normal limits. No mass or hydronephrosis visualized.  Left Kidney:  Renal measurements: 9.9 x 5.0 x 4.7 cm = volume: 121 mL. Echogenicity within normal limits. No mass or hydronephrosis visualized.  Bladder:  Appears normal for degree of bladder distention.  Other:  Small amount of free fluid seen in the left flank.  IMPRESSION: No hydronephrosis.  Small amount of free fluid seen in the left flank.   Electronically Signed By: Leah  Strickland M.D. On: 07/31/2021 10:01  No results found for this or any previous visit.  No results found for this or any previous visit.  No results found for this or any previous visit.   Assessment & Plan:    1. Ureter, stricture -We discussed the management of ureteral injuries and we plan on repeating the retrograde in 3 weeks. Risks/benefits/alternatives discussed.    No follow-ups on file.  Patrick McKenzie, MD  [facility]   

## 2021-08-15 NOTE — Progress Notes (Signed)
Urological Symptom Review  Patient is experiencing the following symptoms: Burning/pain with urination Leakage of urine Injury to kidneys/bladder Painful intercourse   Review of Systems  Gastrointestinal (upper)  : Indigestion/heartburn  Gastrointestinal (lower) : Constipation  Constitutional : Negative for symptoms  Skin: Skin rash/lesion  Eyes: Negative for eye symptoms  Ear/Nose/Throat : Negative for Ear/Nose/Throat symptoms  Hematologic/Lymphatic: Negative for Hematologic/Lymphatic symptoms  Cardiovascular : Leg swelling  Respiratory : Negative for respiratory symptoms  Endocrine: Negative for endocrine symptoms  Musculoskeletal: Back pain  Neurological: Headaches  Psychologic: Depression Anxiety

## 2021-08-15 NOTE — H&P (View-Only) (Signed)
 08/15/2021 11:13 AM   Norma Colon 05/17/1961 [id number]  Referring provider: Grewal, Michelle, MD [address] [place],  [place] [address]  Followup right ureteral injury   HPI: Norma Colon is a 60yo here for evaluation of right ureteral injury. She had a right ureteral reimplant with psoas hitch 07/2019. She underwent ablation of her liver lesions 3 weeks ago and when the foley catheter was placed it was inflated in the right ureter. She then underwent urgent right ureteral stent placement with Dr. Gay on 10/27. She is having suprapubic pain and pain with urination with the stent in place.    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 sherrill--  per last note in epic ,  clinical remission   dx 08/ 2019---- chemoradiation concurrent completed 07-20-2018;  s/p  low anterior resection 09-16-2018;   chemo 10-17-2018  ot 12-19-2018   Anxiety    Arthritis    Chemotherapy induced neutropenia (HCC)    Depression    Headache    migraines    History of HPV infection    History of kidney stones    Hydronephrosis, right    Restless leg syndrome    Scoliosis    Sepsis (HCC)    11-2018    Surgical History: Past Surgical History:  Procedure Laterality Date   COLONOSCOPY     COLPOSCOPY  04/04/2018   CYSTOSCOPY W/ RETROGRADES Right 07/31/2021   Procedure: CYSTOSCOPY WITH RETROGRADE PYELOGRAM, URETEROSCOPY AND STENT PLACEMENT;  Surgeon: Gay, Matthew R, MD;  Location: [facility];  Service: Urology;  Laterality: Right;   CYSTOSCOPY WITH RETROGRADE PYELOGRAM, URETEROSCOPY AND STENT PLACEMENT Right 12/12/2018   Procedure: CYSTOSCOPY WITH RETROGRADE PYELOGRAM, URETEROSCOPY, STONE EXTRACTION AND STENT PLACEMENT;  Surgeon: Verle Brillhart L, MD;  Location: [facility];  Service: Urology;  Laterality: Right;   CYSTOSCOPY WITH RETROGRADE PYELOGRAM, URETEROSCOPY AND STENT PLACEMENT Right 04/17/2019   Procedure:  CYSTOSCOPY WITH RETROGRADE PYELOGRAM, BALLOON DILITATION,DIAGNOSTIC  URETEROSCOPY AND STENT PLACEMENT;  Surgeon: Quinnley Colasurdo L, MD;  Location: [facility];  Service: Urology;  Laterality: Right;   CYSTOSCOPY WITH STENT PLACEMENT Right 11/21/2018   Procedure: CYSTOSCOPY, RIGHT RETROGRADE PYELOGRAM, WITH RIGHT URETERAL STENT PLACEMENT;  Surgeon: Jlyn Bracamonte L, MD;  Location: [facility];  Service: Urology;  Laterality: Right;   IR IMAGING GUIDED PORT INSERTION  09/04/2020   IR NEPHROSTOMY PLACEMENT RIGHT  06/22/2019   IR PATIENT EVAL TECH 0-60 MINS  06/28/2019   IR RADIOLOGIST EVAL & MGMT  06/06/2020   IR RADIOLOGIST EVAL & MGMT  07/17/2020   IR RADIOLOGIST EVAL & MGMT  08/15/2020   IR RADIOLOGIST EVAL & MGMT  09/03/2020   IR RADIOLOGIST EVAL & MGMT  12/18/2020   IR RADIOLOGIST EVAL & MGMT  02/25/2021   IR RADIOLOGIST EVAL & MGMT  06/23/2021   IR RADIOLOGIST EVAL & MGMT  08/12/2021   left forearm surgery due to calcium rock      RADIOLOGY WITH ANESTHESIA N/A 06/19/2020   Procedure: CT WITH ANESTHESIA MICROWAVE ABLATION;  Surgeon: McCullough, Heath, MD;  Location: [facility];  Service: Anesthesiology;  Laterality: N/A;   RADIOLOGY WITH ANESTHESIA N/A 07/30/2021   Procedure: CT MICROWAVE ABLATION OF THE LIVER;  Surgeon: McCullough, Heath K, MD;  Location: [facility];  Service: Radiology;  Laterality: N/A;   ROBOT ASSISTED PYELOPLASTY Right 08/03/2019   Procedure: XI ROBOTIC ASSISTED PYELOPLASTY WITH STENT PLACEMENT;  Surgeon: Laguana Desautel L, MD;    Location: [facility];  Service: Urology;  Laterality: Right;  3 HRS   SHOULDER ARTHROSCOPY W/ ROTATOR CUFF REPAIR Right    XI ROBOTIC ASSISTED LOWER ANTERIOR RESECTION N/A 09/16/2018   Procedure: XI ROBOTIC ASSISTED LOWER ANTERIOR RESECTION ERAS PATHWAY;  Surgeon: Leighton Ruff, MD;  Location: [facility];  Service: General;  Laterality: N/A;    Home Medications:  Allergies as of 08/15/2021       Reactions   Prednisone Other (See  Comments)   "Felt like she was crazy" if on it more than 1 week   Codeine Other (See Comments)   Causes migraine--patient requests this be kept on allergy list        Medication List        Accurate as of August 15, 2021 11:13 AM. If you have any questions, ask your nurse or doctor.          alprazolam 2 MG tablet Commonly known as: XANAX Take 2 mg by mouth every morning.   aspirin EC 81 MG tablet Take 81 mg by mouth every morning. Swallow whole.   Biotin 10 MG Caps Take 10 mg by mouth daily.   buPROPion 300 MG 24 hr tablet Commonly known as: WELLBUTRIN XL Take 300 mg by mouth every morning.   calcium carbonate 1500 (600 Ca) MG Tabs tablet Commonly known as: OSCAL Take 600 mg of elemental calcium by mouth daily.   CITRUCEL PO Take 1 tablet by mouth daily.   cyclobenzaprine 10 MG tablet Commonly known as: FLEXERIL Take 20 mg by mouth at bedtime.   Dialyvite Vitamin D 5000 125 MCG (5000 UT) capsule Generic drug: Cholecalciferol Take 5,000 Units by mouth daily.   diphenoxylate-atropine 2.5-0.025 MG tablet Commonly known as: LOMOTIL TAKE 1 TO 2 TABLETS BY MOUTH FOUR TIMES DAILY AS NEEDED FOR DIARRHEA OR LOOSE STOOLS   ECHINACEA-ZINC-VITAMIN C PO Take 1 tablet by mouth daily.   Fish Oil 1000 MG Caps Take 1,000 mg by mouth daily.   HYDROcodone-acetaminophen 10-325 MG tablet Commonly known as: NORCO Take 1-2 tablets by mouth every 6 (six) hours as needed for moderate pain or severe pain (headache). Post-Operatively.   IMMUNE SUPPORT PO Take 1 tablet by mouth daily.   lidocaine-prilocaine cream Commonly known as: EMLA Apply 1 application topically as directed. Apply 1-2 hours prior to IV stick and cover with plastic wrap   lubiprostone 24 MCG capsule Commonly known as: AMITIZA Take 1 capsule (24 mcg total) by mouth 2 (two) times daily with a meal.   METAMUCIL FIBER PO Take 1 capsule by mouth daily.   multivitamin with minerals Tabs tablet Take 1  tablet by mouth daily.   Na Sulfate-K Sulfate-Mg Sulf 17.5-3.13-1.6 GM/177ML Soln Take 1 kit by mouth as directed. May use generic Suprep   oxyCODONE-acetaminophen 5-325 MG tablet Commonly known as: PERCOCET/ROXICET Take 1-2 tablets by mouth every 6 (six) hours as needed for severe pain.   PRENATAL PO Take 1 tablet by mouth daily.   PROBIOTIC PO Take 1 tablet by mouth every morning. Acidophilus 100 million   rOPINIRole 1 MG tablet Commonly known as: REQUIP Take 1 mg by mouth at bedtime.   TURMERIC PO Take 1 tablet by mouth daily.   vitamin B-12 1000 MCG tablet Commonly known as: CYANOCOBALAMIN Take 1,000 mcg by mouth daily.   vitamin C 1000 MG tablet Take 1,000 mg by mouth daily.        Allergies:  Allergies  Allergen Reactions   Prednisone Other (See Comments)    "  Felt like she was crazy" if on it more than 1 week   Codeine Other (See Comments)    Causes migraine--patient requests this be kept on allergy list    Family History: Family History  Problem Relation Age of Onset   COPD Father        smoker   Stomach cancer Father        spread to lungs and bones   Arthritis Mother    Heart attack Paternal Grandmother    Heart attack Paternal Grandfather    Heart attack Maternal Grandmother    Stroke Maternal Grandmother    Colon cancer Neg Hx    Rectal cancer Neg Hx    Esophageal cancer Neg Hx     Social History:  reports that she quit smoking about 2 years ago. Her smoking use included cigarettes. She has a 30.00 pack-year smoking history. She has never used smokeless tobacco. She reports current alcohol use. She reports that she does not use drugs.  ROS: All other review of systems were reviewed and are negative except what is noted above in HPI  Physical Exam: BP 122/83   Pulse (!) 102   Temp 98.6 F (37 C)   LMP  (LMP Unknown)   Constitutional:  Alert and oriented, No acute distress. HEENT: [place] AT, moist mucus membranes.  Trachea midline, no  masses. Cardiovascular: No clubbing, cyanosis, or edema. Respiratory: Normal respiratory effort, no increased work of breathing. GI: Abdomen is soft, nontender, nondistended, no abdominal masses GU: No CVA tenderness.  Lymph: No cervical or inguinal lymphadenopathy. Skin: No rashes, bruises or suspicious lesions. Neurologic: Grossly intact, no focal deficits, moving all 4 extremities. Psychiatric: Normal mood and affect.  Laboratory Data: Lab Results  Component Value Date   WBC 7.2 08/02/2021   HGB 10.7 (L) 08/02/2021   HCT 33.1 (L) 08/02/2021   MCV 96.5 08/02/2021   PLT 182 08/02/2021    Lab Results  Component Value Date   CREATININE 1.00 08/02/2021    No results found for: PSA  No results found for: TESTOSTERONE  No results found for: HGBA1C  Urinalysis    Component Value Date/Time   COLORURINE YELLOW 08/01/2021 1027   APPEARANCEUR CLEAR 08/01/2021 1027   LABSPEC 1.020 08/01/2021 1027   PHURINE 5.0 08/01/2021 1027   GLUCOSEU NEGATIVE 08/01/2021 1027   HGBUR LARGE (A) 08/01/2021 1027   BILIRUBINUR NEGATIVE 08/01/2021 1027   KETONESUR NEGATIVE 08/01/2021 1027   PROTEINUR 30 (A) 08/01/2021 1027   NITRITE NEGATIVE 08/01/2021 1027   LEUKOCYTESUR MODERATE (A) 08/01/2021 1027    Lab Results  Component Value Date   BACTERIA RARE (A) 08/01/2021    Pertinent Imaging:  Results for orders placed during the hospital encounter of 01/02/20  DG Abd 1 View  Narrative CLINICAL DATA:  Cyst marker capsules swallowed 12/25/2019  EXAM: ABDOMEN - 1 VIEW  COMPARISON:  None.  FINDINGS Capsule markers overlie the descending colon and sigmoid colon. Bowel gas pattern is unremarkable.  IMPRESSION: Capsule markers likely within descending and sigmoid colon.   Electronically Signed By: Praneil  Patel M.D. On: 01/02/2020 15:52  No results found for this or any previous visit.  No results found for this or any previous visit.  No results found for this or any  previous visit.  Results for orders placed during the hospital encounter of 07/30/21  US RENAL  Narrative CLINICAL DATA:  Right hydronephrosis  EXAM: RENAL / URINARY TRACT ULTRASOUND COMPLETE  COMPARISON:  None.  FINDINGS: Right   Kidney:  Renal measurements: 9.8 x 5.6 x 6.0 cm = volume: 172 mL. Echogenicity within normal limits. No mass or hydronephrosis visualized.  Left Kidney:  Renal measurements: 9.9 x 5.0 x 4.7 cm = volume: 121 mL. Echogenicity within normal limits. No mass or hydronephrosis visualized.  Bladder:  Appears normal for degree of bladder distention.  Other:  Small amount of free fluid seen in the left flank.  IMPRESSION: No hydronephrosis.  Small amount of free fluid seen in the left flank.   Electronically Signed By: Leah  Strickland M.D. On: 07/31/2021 10:01  No results found for this or any previous visit.  No results found for this or any previous visit.  No results found for this or any previous visit.   Assessment & Plan:    1. Ureter, stricture -We discussed the management of ureteral injuries and we plan on repeating the retrograde in 3 weeks. Risks/benefits/alternatives discussed.    No follow-ups on file.  Livan Hires, MD  [facility] [place]   

## 2021-08-19 ENCOUNTER — Encounter: Payer: Self-pay | Admitting: Oncology

## 2021-08-19 ENCOUNTER — Telehealth: Payer: Self-pay

## 2021-08-19 ENCOUNTER — Encounter: Payer: 59 | Admitting: Gastroenterology

## 2021-08-19 NOTE — Telephone Encounter (Signed)
Patient called and advised that the pharmacy advised that the medication Dr. Alyson Ingles sent  (mirabegron ER (MYRBETRIQ) 25 MG TB24 tablet) could not be filled. They advised that Dr. Alyson Ingles or a clinical member would need to contact the insurance and advise why patient needs medication. Norma Colon is the insurance. Patient is E- Verified for Medicaid but advised she does not have it and wants it removed form her chart. I am going to contact patient and advise her that she needs to contact medicaid to terminate insurance due to [place] already billing that insurance twice.   Thank you!

## 2021-08-19 NOTE — Telephone Encounter (Signed)
Called patient and made patient aware of pending auth.

## 2021-08-19 NOTE — Telephone Encounter (Signed)
PA started on Covermymeds on 11/14. Pending review by insurance company.

## 2021-08-20 ENCOUNTER — Encounter: Payer: Self-pay | Admitting: Oncology

## 2021-08-21 ENCOUNTER — Encounter (HOSPITAL_COMMUNITY): Payer: Self-pay | Admitting: Emergency Medicine

## 2021-08-21 ENCOUNTER — Emergency Department (HOSPITAL_COMMUNITY): Payer: 59

## 2021-08-21 ENCOUNTER — Other Ambulatory Visit: Payer: Self-pay

## 2021-08-21 ENCOUNTER — Telehealth: Payer: Self-pay | Admitting: Gastroenterology

## 2021-08-21 ENCOUNTER — Emergency Department (HOSPITAL_COMMUNITY)
Admission: EM | Admit: 2021-08-21 | Discharge: 2021-08-21 | Disposition: A | Discharge status: Home / Self Care | Payer: 59 | Attending: Emergency Medicine | Admitting: Emergency Medicine

## 2021-08-21 DIAGNOSIS — Z7982 Long term (current) use of aspirin: Secondary | ICD-10-CM | POA: Insufficient documentation

## 2021-08-21 DIAGNOSIS — Z87891 Personal history of nicotine dependence: Secondary | ICD-10-CM | POA: Diagnosis not present

## 2021-08-21 DIAGNOSIS — R109 Unspecified abdominal pain: Secondary | ICD-10-CM | POA: Diagnosis not present

## 2021-08-21 DIAGNOSIS — Z85048 Personal history of other malignant neoplasm of rectum, rectosigmoid junction, and anus: Secondary | ICD-10-CM | POA: Insufficient documentation

## 2021-08-21 DIAGNOSIS — R319 Hematuria, unspecified: Secondary | ICD-10-CM | POA: Diagnosis present

## 2021-08-21 LAB — HEPATIC FUNCTION PANEL
ALT: 22 U/L (ref 0–44)
AST: 19 U/L (ref 15–41)
Albumin: 3.9 g/dL (ref 3.5–5.0)
Alkaline Phosphatase: 99 U/L (ref 38–126)
Bilirubin, Direct: 0.1 mg/dL (ref 0.0–0.2)
Indirect Bilirubin: 0.6 mg/dL (ref 0.3–0.9)
Total Bilirubin: 0.7 mg/dL (ref 0.3–1.2)
Total Protein: 7.5 g/dL (ref 6.5–8.1)

## 2021-08-21 LAB — BASIC METABOLIC PANEL
Anion gap: 7 (ref 5–15)
BUN: 24 mg/dL — ABNORMAL HIGH (ref 6–20)
CO2: 28 mmol/L (ref 22–32)
Calcium: 9.3 mg/dL (ref 8.9–10.3)
Chloride: 104 mmol/L (ref 98–111)
Creatinine, Ser: 1.13 mg/dL — ABNORMAL HIGH (ref 0.44–1.00)
GFR, Estimated: 56 mL/min — ABNORMAL LOW (ref >60)
Glucose, Bld: 123 mg/dL — ABNORMAL HIGH (ref 70–99)
Potassium: 3.8 mmol/L (ref 3.5–5.1)
Sodium: 139 mmol/L (ref 135–145)

## 2021-08-21 LAB — CBC WITH DIFFERENTIAL/PLATELET
Abs Immature Granulocytes: 0.03 10*3/uL (ref 0.00–0.07)
Basophils Absolute: 0 10*3/uL (ref 0.0–0.1)
Basophils Relative: 0 %
Eosinophils Absolute: 0.2 10*3/uL (ref 0.0–0.5)
Eosinophils Relative: 3 %
HCT: 39.7 % (ref 36.0–46.0)
Hemoglobin: 12.8 g/dL (ref 12.0–15.0)
Immature Granulocytes: 0 %
Lymphocytes Relative: 20 %
Lymphs Abs: 1.6 10*3/uL (ref 0.7–4.0)
MCH: 30.5 pg (ref 26.0–34.0)
MCHC: 32.2 g/dL (ref 30.0–36.0)
MCV: 94.7 fL (ref 80.0–100.0)
Monocytes Absolute: 0.7 10*3/uL (ref 0.1–1.0)
Monocytes Relative: 9 %
Neutro Abs: 5.5 10*3/uL (ref 1.7–7.7)
Neutrophils Relative %: 68 %
Platelets: 266 10*3/uL (ref 150–400)
RBC: 4.19 MIL/uL (ref 3.87–5.11)
RDW: 13.2 % (ref 11.5–15.5)
WBC: 8.1 10*3/uL (ref 4.0–10.5)
nRBC: 0 % (ref 0.0–0.2)

## 2021-08-21 LAB — URINALYSIS, ROUTINE W REFLEX MICROSCOPIC: RBC / HPF: >50 RBC/hpf — ABNORMAL HIGH (ref 0–5)

## 2021-08-21 MED ORDER — IOHEXOL 350 MG/ML SOLN
80.0000 mL | Freq: Once | INTRAVENOUS | Status: AC | PRN
  Administered 2021-08-21: 22:00:00 80 mL via INTRAVENOUS

## 2021-08-21 NOTE — Telephone Encounter (Signed)
Returned call to patient. I read the note verbatim that Dr. Bryan Lemma sent on 11/2. Patient talked over me as I am reading his recommendation for the office visit. She repeatedly stated "No, No, No." She states that she was told by Dr. Loletha Grayer while she was in the hospital to call us to reschedule her colonoscopy for January. Advised that we did not receive a message with any of this information. Patient would like Dr. Loletha Grayer to call her directly. She is aware that he is out of the office for the rest of this week, but we will let him know.

## 2021-08-21 NOTE — ED Provider Notes (Signed)
[facility] DEPT Provider Note   CSN: [id number] Arrival date & time: 08/21/21  1910     History Chief Complaint  Patient presents with   Hematuria    Norma Colon is a 60 y.o. female.   Hematuria Associated symptoms include abdominal pain. Pertinent negatives include no chest pain and no shortness of breath. Patient presents with blood in her catheter.  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  Had previously been implanted after a carcinoma.  Around 3 weeks ago had a stent placed.  Has had continued abdominal pain since.  Sees Dr. Alyson Ingles.  However now started to have some blood in her catheter.  States the pain got worse.  No fevers.  States she does have constipation some difficulty having bowel movements.  Sees GI for this however.     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[place])    Depression    Headache    migraines    History of HPV infection    History of kidney stones    Hydronephrosis, right    Restless leg syndrome    Scoliosis    Sepsis ([place])    11-2018    Patient Active Problem List   Diagnosis Date Noted   Colon cancer metastasized to liver ([place]) 07/30/2021   Goals of care, counseling/discussion 09/12/2020   Rectal cancer metastasized to liver ([place]) 06/19/2020   Ureteral stricture 08/03/2019   Insomnia    Pancytopenia ([place]) 11/21/2018   Right ureteral stone 11/21/2018   Acute lower UTI 11/21/2018   Sepsis ([place]) 11/21/2018   Acute cystitis with hematuria    Rectal cancer ([place]) 09/16/2018   Adenocarcinoma of rectum ([place]) 05/30/2018    Past Surgical History:  Procedure Laterality Date   COLONOSCOPY      COLPOSCOPY  04/04/2018   CYSTOSCOPY W/ RETROGRADES Right 07/31/2021   Procedure: CYSTOSCOPY WITH RETROGRADE PYELOGRAM, URETEROSCOPY AND STENT PLACEMENT;  Surgeon: Janith Lima, MD;  Location: [facility];  Service: Urology;  Laterality: Right;   CYSTOSCOPY WITH RETROGRADE PYELOGRAM, URETEROSCOPY AND STENT PLACEMENT Right 12/12/2018   Procedure: CYSTOSCOPY WITH RETROGRADE PYELOGRAM, URETEROSCOPY, STONE EXTRACTION AND STENT PLACEMENT;  Surgeon: Cleon Gustin, MD;  Location[facility];  Service: Urology;  Laterality: Right;   CYSTOSCOPY WITH RETROGRADE PYELOGRAM, URETEROSCOPY AND STENT PLACEMENT Right 04/17/2019   Procedure: CYSTOSCOPY WITH RETROGRADE PYELOGRAM, BALLOON [place]  URETEROSCOPY AND STENT PLACEMENT;  Surgeon: Cleon Gustin, MD;  Location: [facility];  Service: Urology;  Laterality: Right;   CYSTOSCOPY WITH STENT PLACEMENT Right 11/21/2018   Procedure: CYSTOSCOPY, RIGHT RETROGRADE PYELOGRAM, WITH RIGHT URETERAL STENT PLACEMENT;  Surgeon: Cleon Gustin, MD;  Location: [facility];  Service: Urology;  Laterality: Right;   IR IMAGING GUIDED PORT INSERTION  09/04/2020   IR NEPHROSTOMY PLACEMENT RIGHT  06/22/2019   IR PATIENT EVAL TECH 0-60 MINS  06/28/2019   IR RADIOLOGIST EVAL & MGMT  06/06/2020   IR RADIOLOGIST EVAL & MGMT  07/17/2020   IR RADIOLOGIST EVAL & MGMT  08/15/2020   IR RADIOLOGIST EVAL & MGMT  09/03/2020   IR RADIOLOGIST EVAL & MGMT  12/18/2020   IR RADIOLOGIST  EVAL & MGMT  02/25/2021   IR RADIOLOGIST EVAL & MGMT  06/23/2021   IR RADIOLOGIST EVAL & MGMT  08/12/2021   left forearm surgery due to calcium rock      RADIOLOGY WITH ANESTHESIA N/A 06/19/2020   Procedure: CT WITH ANESTHESIA MICROWAVE ABLATION;  Surgeon: Jacqulynn Cadet, MD;  Location: [facility];  Service: Anesthesiology;  Laterality: N/A;   RADIOLOGY WITH ANESTHESIA N/A 07/30/2021   Procedure: CT MICROWAVE ABLATION OF THE LIVER;  Surgeon: Criselda Peaches,  MD;  Location: [facility];  Service: Radiology;  Laterality: N/A;   ROBOT ASSISTED PYELOPLASTY Right 08/03/2019   Procedure: XI ROBOTIC ASSISTED PYELOPLASTY WITH STENT PLACEMENT;  Surgeon: Cleon Gustin, MD;  Location: [facility];  Service: Urology;  Laterality: Right;  3 HRS   SHOULDER ARTHROSCOPY W/ ROTATOR CUFF REPAIR Right    XI ROBOTIC ASSISTED LOWER ANTERIOR RESECTION N/A 09/16/2018   Procedure: XI ROBOTIC ASSISTED LOWER ANTERIOR RESECTION ERAS PATHWAY;  Surgeon: Leighton Ruff, MD;  Location: [facility];  Service: General;  Laterality: N/A;     OB History   No obstetric history on file.     Family History  Problem Relation Age of Onset   COPD Father        smoker   Stomach cancer Father        spread to lungs and bones   Arthritis Mother    Heart attack Paternal Grandmother    Heart attack Paternal Grandfather    Heart attack Maternal Grandmother    Stroke Maternal Grandmother    Colon cancer Neg Hx    Rectal cancer Neg Hx    Esophageal cancer Neg Hx     Social History   Tobacco Use   Smoking status: Former    Packs/day: 1.00    Years: 30.00    Pack years: 30.00    Types: Cigarettes    Quit date: 09/16/2018    Years since quitting: 2.9   Smokeless tobacco: Never  Vaping Use   Vaping Use: Never used  Substance Use Topics   Alcohol use: Yes    Comment: social   Drug use: Never    Home Medications Prior to Admission medications   Medication Sig Start Date End Date Taking? Authorizing Provider  alprazolam Duanne Moron) 2 MG tablet Take 2 mg by mouth every morning.     [provider]  Ascorbic Acid (VITAMIN C) 1000 MG tablet Take 1,000 mg by mouth daily. Patient not taking: Reported on 08/08/2021    [provider]  aspirin EC 81 MG tablet Take 81 mg by mouth every morning. Swallow whole.     [provider]  Biotin 10 MG CAPS Take 10 mg by mouth daily. Patient not taking: Reported on 08/08/2021    [provider]  buPROPion  (WELLBUTRIN XL) 300 MG 24 hr tablet Take 300 mg by mouth every morning.     [provider]  calcium carbonate (OSCAL) 1500 (600 Ca) MG TABS tablet Take 600 mg of elemental calcium by mouth daily. Patient not taking: Reported on 08/08/2021    [provider]  Cholecalciferol (DIALYVITE VITAMIN D 5000) 125 MCG (5000 UT) capsule Take 5,000 Units by mouth daily. Patient not taking: Reported on 08/08/2021    [provider]  clotrimazole-betamethasone (LOTRISONE) cream Apply 1 application topically 2 (two) times daily. 08/15/21   McKenzie, Candee Furbish, MD  cyclobenzaprine (FLEXERIL) 10 MG tablet Take 20 mg by mouth at bedtime.    [provider]  diphenoxylate-atropine (LOMOTIL) 2.5-0.025 MG tablet TAKE 1 TO 2 TABLETS BY MOUTH FOUR TIMES DAILY AS NEEDED FOR DIARRHEA OR LOOSE STOOLS Patient not taking: No sig reported 03/10/21   Owens Shark, NP  [facility] C PO Take 1 tablet by mouth daily. Patient not taking: Reported on 08/08/2021    [provider]  HYDROcodone-acetaminophen (NORCO) 10-325 MG tablet Take 1-2 tablets by mouth every 6 (six) hours as needed for moderate pain or severe pain (headache). Post-Operatively. 08/05/19   Alexis Frock, MD  lidocaine-prilocaine (EMLA) cream Apply 1 application topically as directed. Apply 1-2 hours prior to IV stick and cover with plastic wrap 11/28/20   Ladell Pier, MD  lubiprostone (AMITIZA) 24 MCG capsule Take 1 capsule (24 mcg total) by mouth 2 (two) times daily with a meal. 07/16/21 01/07/23  Cirigliano, Vito V, DO  METAMUCIL FIBER PO Take 1 capsule by mouth daily.    [provider]  Methylcellulose, Laxative, (CITRUCEL PO) Take 1 tablet by mouth daily.    [provider]  mirabegron ER (MYRBETRIQ) 25 MG TB24 tablet Take 1 tablet (25 mg total) by mouth daily. 08/15/21   McKenzie, Candee Furbish, MD  Multiple Vitamin (MULTIVITAMIN WITH MINERALS) TABS tablet Take 1 tablet by mouth  daily. Patient not taking: Reported on 08/08/2021    [provider]  Multiple Vitamins-Minerals (IMMUNE SUPPORT PO) Take 1 tablet by mouth daily. Patient not taking: Reported on 08/08/2021    [provider]  Na Sulfate-K Sulfate-Mg Sulf 17.5-3.13-1.6 GM/177ML SOLN Take 1 kit by mouth as directed. May use generic Suprep Patient not taking: Reported on 08/08/2021 07/31/21   Cirigliano, Dominic Pea, DO  Omega-3 Fatty Acids (FISH OIL) 1000 MG CAPS Take 1,000 mg by mouth daily. Patient not taking: Reported on 08/08/2021    [provider]  oxyCODONE-acetaminophen (PERCOCET/ROXICET) 5-325 MG tablet Take 1-2 tablets by mouth every 6 (six) hours as needed for severe pain. 08/07/21   McKenzie, Candee Furbish, MD  Prenatal Vit-Fe Fumarate-FA (PRENATAL PO) Take 1 tablet by mouth daily.    [provider]  Probiotic Product (PROBIOTIC PO) Take 1 tablet by mouth every morning. Acidophilus 100 million Patient not taking: Reported on 08/08/2021    [provider]  rOPINIRole (REQUIP) 1 MG tablet Take 1 mg by mouth at bedtime. Patient not taking: Reported on 08/08/2021    [provider]  TURMERIC PO Take 1 tablet by mouth daily. Patient not taking: Reported on 08/08/2021    [provider]  vitamin B-12 (CYANOCOBALAMIN) 1000 MCG tablet Take 1,000 mcg by mouth daily. Patient not taking: Reported on 08/08/2021    [provider]    Allergies    Prednisone and Codeine  Review of Systems   Review of Systems  Constitutional:  Positive for appetite change.  HENT:  Negative for congestion.   Respiratory:  Negative for shortness of breath.   Cardiovascular:  Negative for chest pain.  Gastrointestinal:  Positive for abdominal pain.  Genitourinary:  Positive for hematuria.  Musculoskeletal:  Negative for back pain.  Neurological:  Negative for weakness.  Psychiatric/Behavioral:  Negative for confusion.    Physical Exam Updated Vital Signs BP 120/83  (BP Location: Right Arm)   Pulse 87   Temp 98.3 F (36.8 C) (Oral)   Resp 17   Ht 5' 8" (1.727 m)   Wt 98 kg   LMP  (LMP Unknown)   SpO2 98%   BMI 32.84 kg/m   Physical Exam  Vitals and nursing note reviewed.  Eyes:     Pupils: Pupils are equal, round, and reactive to light.  Cardiovascular:     Rate and Rhythm: Regular rhythm.  Pulmonary:     Breath sounds: No wheezing or rhonchi.  Abdominal:     Tenderness: There is abdominal tenderness.     Comments: Lower abdominal tenderness without rebound or guarding.  Some possible suprapubic fullness.  Genitourinary:    Comments: Foley catheter in place with blood in the bag. Musculoskeletal:        General: No tenderness.     Cervical back: Neck supple.  Skin:    General: Skin is warm.     Capillary Refill: Capillary refill takes less than 2 seconds.  Neurological:     Mental Status: She is alert and oriented to person, place, and time.    ED Results / Procedures / Treatments   Labs (all labs ordered are listed, but only abnormal results are displayed) Labs Reviewed  BASIC METABOLIC PANEL - Abnormal; Notable for the following components:      Result Value   Glucose, Bld 123 (*)    BUN 24 (*)    Creatinine, Ser 1.13 (*)    GFR, Estimated 56 (*)    All other components within normal limits  URINALYSIS, ROUTINE W REFLEX MICROSCOPIC - Abnormal; Notable for the following components:   Color, Urine RED (*)    APPearance TURBID (*)    Glucose, UA   (*)    Value: TEST NOT REPORTED DUE TO COLOR INTERFERENCE OF URINE PIGMENT   Hgb urine dipstick   (*)    Value: TEST NOT REPORTED DUE TO COLOR INTERFERENCE OF URINE PIGMENT   Bilirubin Urine   (*)    Value: TEST NOT REPORTED DUE TO COLOR INTERFERENCE OF URINE PIGMENT   Ketones, ur   (*)    Value: TEST NOT REPORTED DUE TO COLOR INTERFERENCE OF URINE PIGMENT   Protein, ur   (*)    Value: TEST NOT REPORTED DUE TO COLOR INTERFERENCE OF URINE PIGMENT   Nitrite   (*)    Value: TEST  NOT REPORTED DUE TO COLOR INTERFERENCE OF URINE PIGMENT   Leukocytes,Ua   (*)    Value: TEST NOT REPORTED DUE TO COLOR INTERFERENCE OF URINE PIGMENT   RBC / HPF >50 (*)    Bacteria, UA MANY (*)    All other components within normal limits  URINE CULTURE  HEPATIC FUNCTION PANEL  CBC WITH DIFFERENTIAL/PLATELET  CBC WITH DIFFERENTIAL/PLATELET    EKG None  Radiology CT Renal Stone Study  Result Date: 08/21/2021 CLINICAL DATA:  Hematuria, history of prior distal right ureteral injury EXAM: CT ABDOMEN AND PELVIS WITHOUT CONTRAST TECHNIQUE: Multidetector CT imaging of the abdomen and pelvis was performed following the standard protocol without IV contrast. COMPARISON:  07/31/2021 FINDINGS: Lower chest: No acute abnormality. Hepatobiliary: Gallbladder is decompressed with multiple gallstones within. The liver shows the focal cyst in the left lobe of the liver. There is a 4.7 x 4.2 cm hypodense area in the right lobe of the liver posterior consistent with prior microwave ablation. The overall appearance is similar to that seen on prior CT examination. Pancreas: Unremarkable. No pancreatic ductal dilatation or surrounding inflammatory changes. Spleen: Normal in size without focal abnormality. Adrenals/Urinary Tract: Adrenal glands are within normal limits. Left kidney is unremarkable. Right kidney shows no hydronephrosis. Right ureteral stent is seen in satisfactory position consistent with the known history. Bladder is decompressed by  Foley catheter. The previously seen hyperdense ascites has resolved in the interval. Stomach/Bowel: No obstructive or inflammatory changes of the colon are seen. The appendix is within normal limits. Small bowel and stomach are unremarkable. Vascular/Lymphatic: No significant vascular findings are present. No enlarged abdominal or pelvic lymph nodes. Reproductive: Uterus and bilateral adnexa are unremarkable. Other: No abdominal wall hernia or abnormality. No abdominopelvic  ascites. Musculoskeletal: Stable grade 2 anterolisthesis of L5 on S1 is noted. IMPRESSION: Multiple gallstones without complicating factors. Microwave ablation defect within the right lobe of the liver. Right ureteral stent in satisfactory position. The previously seen hyperdense ascites has resolved in the interval. Electronically Signed   By: Inez Catalina M.D.   On: 08/21/2021 20:09   CT Angio Abd/Pel w/ and/or w/o  Result Date: 08/21/2021 CLINICAL DATA:  Hematuria and left-sided abdominal pain EXAM: CTA ABDOMEN AND PELVIS WITHOUT AND WITH CONTRAST TECHNIQUE: Multidetector CT imaging of the abdomen and pelvis was performed using the standard protocol during bolus administration of intravenous contrast. Multiplanar reconstructed images and MIPs were obtained and reviewed to evaluate the vascular anatomy. CONTRAST:  77m OMNIPAQUE IOHEXOL 350 MG/ML SOLN COMPARISON:  Noncontrast CT from earlier in the same day. FINDINGS: VASCULAR Aorta: Abdominal aorta is within normal limits without aneurysmal dilatation or dissection. Celiac: Patent without evidence of aneurysm, dissection, vasculitis or significant stenosis. SMA: Patent without evidence of aneurysm, dissection, vasculitis or significant stenosis. Renals: Both renal arteries are patent without evidence of aneurysm, dissection, vasculitis, fibromuscular dysplasia or significant stenosis. IMA: Diminutive but patent. Inflow: Iliacs are within normal limits. Veins: No acute venous abnormality is noted. Circumaortic left renal vein is seen. Review of the MIP images confirms the above findings. NON-VASCULAR Lower chest: No acute abnormality. Hepatobiliary: Microwave ablation defect is again identified in the right lobe of the liver. Multiple gallstones are seen and stable similar to that seen on prior exam. Pancreas: Unremarkable. No pancreatic ductal dilatation or surrounding inflammatory changes. Spleen: Normal in size without focal abnormality. Adrenals/Urinary  Tract: Adrenal glands are within normal limits. Kidneys demonstrate a normal enhancement pattern. Again there are changes consistent with right ureteral stent. No renal calculi or urinary tract obstructive changes are seen. Delayed images demonstrate normal enhancement of the kidneys. Subsequent delayed images show mild fullness of the ureter on right with ureteral stent in place. Normal excretion of contrast is seen. Bladder is again decompressed by Foley catheter. Delayed images through the bladder demonstrates some filling defect adjacent to the Foley catheter which may represent a small amount of thrombus. This would account for the hematuria. This is best seen on coronal image number 96 of series 17. Stomach/Bowel: Postsurgical changes are seen in the rectum better visualized on the current exam due to the thinner slice sections no obstructive or inflammatory changes are seen. The appendix is within normal limits. Small bowel and stomach are again within normal limits. Lymphatic: No significant lymphadenopathy is seen. Reproductive: Uterus and bilateral adnexa are unremarkable. Other: No abdominal wall hernia or abnormality. No abdominopelvic ascites. Musculoskeletal: Stable anterolisthesis of L5 on S1 is noted. IMPRESSION: VASCULAR No specific vascular abnormality is identified. NON-VASCULAR Filling defect is noted within the bladder on delayed images best noted on the coronal images consistent with mild hematoma within the bladder. This may be related to irritation from the Foley catheter or possibly ureteral stent. No other definitive abnormality is identified to correspond with these changes. Right ureteral stent in satisfactory position. Cholelithiasis without complicating factors. Microwave ablation defect within the right lobe  of the liver. Electronically Signed   By: Inez Catalina M.D.   On: 08/21/2021 22:41    Procedures Procedures   Medications Ordered in ED Medications  iohexol (OMNIPAQUE) 350  MG/ML injection 80 mL (80 mLs Intravenous Contrast Given 08/21/21 2209)    ED Course  I have reviewed the triage vital signs and the nursing notes.  Pertinent labs & imaging results that were available during my care of the patient were reviewed by me and considered in my medical decision making (see chart for details).    MDM Rules/Calculators/A&P                           Patient with hematuria.  Complicated urologic history including recent ureteral injury.  Has stent in place.  Discussed with Dr. Abner Greenspan.  CT scans done.  Stent position.  Does have likely hematoma in the bladder.  Otherwise lab overall reassuring.  Urine has some white cells but not necessarily infection.  Discussed with Dr. Abner Greenspan again will not empirically treat.  Takes pain medicines already home.  Has had some constipation.  Already sees GI for this.  Will discharge home and her specialist can adjust medications as needed Final Clinical Impression(s) / ED Diagnoses Final diagnoses:  Hematuria, unspecified type    Rx / DC Orders ED Discharge Orders     None        Davonna Belling, MD 08/21/21 2357

## 2021-08-21 NOTE — Discharge Instructions (Addendum)
Call GI and urology about the continued pain.

## 2021-08-21 NOTE — ED Triage Notes (Signed)
Patient is bleeding from her catheter. Patient states he had a traumatic event when she had surgery.

## 2021-08-21 NOTE — ED Provider Notes (Signed)
Emergency Medicine Provider Triage Evaluation Note  Norma Colon , a 61 y.o. female  was evaluated in triage.  Pt complains of bleeding from her urethral catheter that was placed status post surgery on 07/31/2021.  She notes that in her catheter bag there is blood as well as blood/urine leaking from the catheter site.  Patient has associated mid to right sided abdominal pain.  She has not tried any medications for her symptoms.  She denies chest pain, shortness of breath, fever, chills.  Review of Systems  Positive: Mid-right sided abdominal pain Negative: Chest pain, shortness of breath  Physical Exam  BP (!) 133/94 (BP Location: Left Arm)   Pulse (!) 103   Temp 98.3 F (36.8 C) (Oral)   Resp 16   Ht 5\' 8"  (1.727 m)   Wt 98 kg   LMP  (LMP Unknown)   SpO2 99%   BMI 32.84 kg/m  Gen:   Awake, no distress   Resp:  Normal effort  MSK:   Moves extremities without difficulty  Other: Catheter bag visualized with bloody appearance without clots.  Medical Decision Making  Medically screening exam initiated at 7:38 PM.  Appropriate orders placed.  Norma Colon was informed that the remainder of the evaluation will be completed by another provider, this initial triage assessment does not replace that evaluation, and the importance of remaining in the ED until their evaluation is complete.   Tawan Degroote A, PA-C 08/21/21 2016    Lucrezia Starch, MD 08/22/21 2024

## 2021-08-22 ENCOUNTER — Emergency Department (HOSPITAL_COMMUNITY)
Admission: EM | Admit: 2021-08-22 | Discharge: 2021-08-22 | Disposition: A | Discharge status: Home / Self Care | Payer: 59 | Source: Home / Self Care | Attending: Emergency Medicine | Admitting: Emergency Medicine

## 2021-08-22 ENCOUNTER — Other Ambulatory Visit: Payer: Self-pay

## 2021-08-22 ENCOUNTER — Telehealth: Payer: Self-pay

## 2021-08-22 ENCOUNTER — Encounter (HOSPITAL_COMMUNITY): Payer: Self-pay

## 2021-08-22 DIAGNOSIS — T83091A Other mechanical complication of indwelling urethral catheter, initial encounter: Secondary | ICD-10-CM | POA: Insufficient documentation

## 2021-08-22 DIAGNOSIS — Y846 Urinary catheterization as the cause of abnormal reaction of the patient, or of later complication, without mention of misadventure at the time of the procedure: Secondary | ICD-10-CM | POA: Insufficient documentation

## 2021-08-22 DIAGNOSIS — Z85048 Personal history of other malignant neoplasm of rectum, rectosigmoid junction, and anus: Secondary | ICD-10-CM | POA: Insufficient documentation

## 2021-08-22 DIAGNOSIS — Z7982 Long term (current) use of aspirin: Secondary | ICD-10-CM | POA: Insufficient documentation

## 2021-08-22 DIAGNOSIS — Z85038 Personal history of other malignant neoplasm of large intestine: Secondary | ICD-10-CM | POA: Insufficient documentation

## 2021-08-22 DIAGNOSIS — Z8505 Personal history of malignant neoplasm of liver: Secondary | ICD-10-CM | POA: Insufficient documentation

## 2021-08-22 DIAGNOSIS — Z87891 Personal history of nicotine dependence: Secondary | ICD-10-CM | POA: Insufficient documentation

## 2021-08-22 DIAGNOSIS — Z79899 Other long term (current) drug therapy: Secondary | ICD-10-CM | POA: Insufficient documentation

## 2021-08-22 DIAGNOSIS — R319 Hematuria, unspecified: Secondary | ICD-10-CM | POA: Insufficient documentation

## 2021-08-22 DIAGNOSIS — T83511A Infection and inflammatory reaction due to indwelling urethral catheter, initial encounter: Secondary | ICD-10-CM | POA: Diagnosis not present

## 2021-08-22 DIAGNOSIS — R509 Fever, unspecified: Secondary | ICD-10-CM | POA: Diagnosis not present

## 2021-08-22 MED ORDER — OXYBUTYNIN CHLORIDE 5 MG PO TABS
5.0000 mg | ORAL_TABLET | Freq: Three times a day (TID) | ORAL | 0 refills | Status: DC
Start: 2021-08-22 — End: 2021-08-30

## 2021-08-22 MED ORDER — NITROFURANTOIN MONOHYD MACRO 100 MG PO CAPS: 100.0000 mg | ORAL_CAPSULE | Freq: Two times a day (BID) | ORAL | 0 refills | Status: DC

## 2021-08-22 MED ORDER — OXYCODONE HCL 5 MG PO TABS
5.0000 mg | ORAL_TABLET | Freq: Once | ORAL | Status: AC
  Administered 2021-08-22: 5 mg via ORAL
  Filled 2021-08-22: qty 1

## 2021-08-22 MED ORDER — HYDROMORPHONE HCL 1 MG/ML IJ SOLN
1.0000 mg | Freq: Once | INTRAMUSCULAR | Status: AC
  Administered 2021-08-22: 1 mg via INTRAVENOUS
  Filled 2021-08-22: qty 1

## 2021-08-22 NOTE — ED Provider Notes (Signed)
[facility] DEPT Provider Note   CSN: [id number] Arrival date & time: 08/22/21  1143     History Chief Complaint  Patient presents with   Urinary Retention    Norma Colon is a 60 y.o. female.  HPI      60 year old female with a history of rectal adenocarcinoma, history of reimplanted right ureter and distal right ureteral injury, who had a cystoscopy, right retrograde pyelogram with interpretation, right ureteral stent placement and Foley catheter placement completed by urology on October 27, seen by Dr. Alyson Ingles on November 11 as an outpatient with ureteral stricture noted and plan for repeat retrograde pyelogram in 3 weeks, emergency department visit last night with concern for hematuria with hemoglobin found to be 12.8, bacteria and red blood cells noted on microscopic and CT stone study as well as CT angio completed showing satisfactory right ureteral stent placement, hematoma in the bladder who presents with concern for obstruction of foley catheter.  Reports severe pain suprapubic with radiation upwaD, has been continuing since prior to evaluation last night. No fever, diarrhea, vomiting. Reports her foley has not drained at all since last night.    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[facility]) oncologist-- dr Benay Spice--  per last note in epic ,  clinical remission   dx 08/ 2019---- chemoradiation concurrent completed 07-20-2018;  s/p  low anterior resection 09-16-2018;   chemo 10-17-2018  ot 12-19-2018   Anxiety    Arthritis    Chemotherapy induced neutropenia ([place])    Depression    Headache    migraines    History of HPV infection    History of kidney stones    Hydronephrosis, right    Restless leg syndrome    Scoliosis    Sepsis ([place])    11-2018    Patient Active Problem List   Diagnosis Date Noted   Colon cancer metastasized to liver ([place]) 07/30/2021   Goals of care, counseling/discussion  09/12/2020   Rectal cancer metastasized to liver ([place]) 06/19/2020   Ureteral stricture 08/03/2019   Insomnia    Pancytopenia ([place]) 11/21/2018   Right ureteral stone 11/21/2018   Acute lower UTI 11/21/2018   Sepsis ([place]) 11/21/2018   Acute cystitis with hematuria    Rectal cancer ([place]) 09/16/2018   Adenocarcinoma of rectum ([place]) 05/30/2018    Past Surgical History:  Procedure Laterality Date   COLONOSCOPY     COLPOSCOPY  04/04/2018   CYSTOSCOPY W/ RETROGRADES Right 07/31/2021   Procedure: CYSTOSCOPY WITH RETROGRADE PYELOGRAM, URETEROSCOPY AND STENT PLACEMENT;  Surgeon: Janith Lima, MD;  Location: [facility];  Service: Urology;  Laterality: Right;   CYSTOSCOPY WITH RETROGRADE PYELOGRAM, URETEROSCOPY AND STENT PLACEMENT Right 12/12/2018   Procedure: CYSTOSCOPY WITH RETROGRADE PYELOGRAM, URETEROSCOPY, STONE EXTRACTION AND STENT PLACEMENT;  Surgeon: Cleon Gustin, MD;  Location: [facility];  Service: Urology;  Laterality: Right;   CYSTOSCOPY WITH RETROGRADE PYELOGRAM, URETEROSCOPY AND STENT PLACEMENT Right 04/17/2019   Procedure: CYSTOSCOPY WITH RETROGRADE PYELOGRAM, BALLOON [place]  URETEROSCOPY AND STENT PLACEMENT;  Surgeon: Cleon Gustin, MD;  Location: [facility];  Service: Urology;  Laterality: Right;   CYSTOSCOPY WITH STENT PLACEMENT Right 11/21/2018   Procedure: CYSTOSCOPY, RIGHT RETROGRADE PYELOGRAM, WITH RIGHT URETERAL STENT PLACEMENT;  Surgeon: Cleon Gustin, MD;  Location: [facility];  Service: Urology;  Laterality: Right;   IR IMAGING GUIDED PORT INSERTION  09/04/2020   IR NEPHROSTOMY PLACEMENT RIGHT  06/22/2019   IR  PATIENT EVAL TECH 0-60 MINS  06/28/2019   IR RADIOLOGIST EVAL & MGMT  06/06/2020   IR RADIOLOGIST EVAL & MGMT  07/17/2020   IR RADIOLOGIST EVAL & MGMT  08/15/2020   IR RADIOLOGIST EVAL & MGMT  09/03/2020   IR RADIOLOGIST EVAL & MGMT  12/18/2020   IR RADIOLOGIST EVAL & MGMT  02/25/2021   IR RADIOLOGIST EVAL  & MGMT  06/23/2021   IR RADIOLOGIST EVAL & MGMT  08/12/2021   left forearm surgery due to calcium rock      RADIOLOGY WITH ANESTHESIA N/A 06/19/2020   Procedure: CT WITH ANESTHESIA MICROWAVE ABLATION;  Surgeon: Jacqulynn Cadet, MD;  Location: [facility];  Service: Anesthesiology;  Laterality: N/A;   RADIOLOGY WITH ANESTHESIA N/A 07/30/2021   Procedure: CT MICROWAVE ABLATION OF THE LIVER;  Surgeon: Criselda Peaches, MD;  Location: [facility];  Service: Radiology;  Laterality: N/A;   ROBOT ASSISTED PYELOPLASTY Right 08/03/2019   Procedure: XI ROBOTIC ASSISTED PYELOPLASTY WITH STENT PLACEMENT;  Surgeon: Cleon Gustin, MD;  Location: [facility];  Service: Urology;  Laterality: Right;  3 HRS   SHOULDER ARTHROSCOPY W/ ROTATOR CUFF REPAIR Right    XI ROBOTIC ASSISTED LOWER ANTERIOR RESECTION N/A 09/16/2018   Procedure: XI ROBOTIC ASSISTED LOWER ANTERIOR RESECTION ERAS PATHWAY;  Surgeon: Leighton Ruff, MD;  Location: [facility];  Service: General;  Laterality: N/A;     OB History   No obstetric history on file.     Family History  Problem Relation Age of Onset   COPD Father        smoker   Stomach cancer Father        spread to lungs and bones   Arthritis Mother    Heart attack Paternal Grandmother    Heart attack Paternal Grandfather    Heart attack Maternal Grandmother    Stroke Maternal Grandmother    Colon cancer Neg Hx    Rectal cancer Neg Hx    Esophageal cancer Neg Hx     Social History   Tobacco Use   Smoking status: Former    Packs/day: 1.00    Years: 30.00    Pack years: 30.00    Types: Cigarettes    Quit date: 09/16/2018    Years since quitting: 2.9   Smokeless tobacco: Never  Vaping Use   Vaping Use: Never used  Substance Use Topics   Alcohol use: Yes    Comment: social   Drug use: Never    Home Medications Prior to Admission medications   Medication Sig Start Date End Date Taking? Authorizing Provider  alprazolam Duanne Moron) 2 MG tablet Take 2 mg by mouth every  morning.    Yes [provider]  Ascorbic Acid (VITAMIN C) 1000 MG tablet Take 1,000 mg by mouth daily.   Yes [provider]  aspirin EC 81 MG tablet Take 81 mg by mouth every morning. Swallow whole.    Yes [provider]  Biotin 10 MG CAPS Take 10 mg by mouth daily.   Yes [provider]  buPROPion (WELLBUTRIN XL) 300 MG 24 hr tablet Take 300 mg by mouth every morning.    Yes [provider]  Cholecalciferol (DIALYVITE VITAMIN D 5000) 125 MCG (5000 UT) capsule Take 5,000 Units by mouth daily.   Yes [provider]  clotrimazole-betamethasone (LOTRISONE) cream Apply 1 application topically 2 (two) times daily. 08/15/21  Yes McKenzie, Candee Furbish, MD  cyclobenzaprine (FLEXERIL) 10 MG tablet Take 20 mg by mouth  at bedtime.   Yes [provider]  ECHINACEA-ZINC-VITAMIN C PO Take 1 tablet by mouth daily.   Yes [provider]  HYDROcodone-acetaminophen (NORCO) 10-325 MG tablet Take 1-2 tablets by mouth every 6 (six) hours as needed for moderate pain or severe pain (headache). Post-Operatively. 08/05/19  Yes Alexis Frock, MD  lidocaine-prilocaine (EMLA) cream Apply 1 application topically as directed. Apply 1-2 hours prior to IV stick and cover with plastic wrap Patient taking differently: Apply 1 application topically daily as needed (port access). 11/28/20  Yes Ladell Pier, MD  lubiprostone (AMITIZA) 24 MCG capsule Take 1 capsule (24 mcg total) by mouth 2 (two) times daily with a meal. 07/16/21 01/07/23 Yes Cirigliano, Vito V, DO  METAMUCIL FIBER PO Take 1 capsule by mouth daily.   Yes [provider]  Multiple Vitamin (MULTIVITAMIN WITH MINERALS) TABS tablet Take 1 tablet by mouth daily.   Yes [provider]  nitrofurantoin, macrocrystal-monohydrate, (MACROBID) 100 MG capsule Take 1 capsule (100 mg total) by mouth 2 (two) times daily for 7 days. 08/22/21 08/29/21 Yes Gareth Morgan, MD  Omega-3 Fatty Acids  (FISH OIL) 1000 MG CAPS Take 1,000 mg by mouth daily.   Yes [provider]  oxybutynin (DITROPAN) 5 MG tablet Take 1 tablet (5 mg total) by mouth 3 (three) times daily for 7 days. 08/22/21 08/29/21 Yes Gareth Morgan, MD  oxyCODONE-acetaminophen (PERCOCET/ROXICET) 5-325 MG tablet Take 1-2 tablets by mouth every 6 (six) hours as needed for severe pain. 08/07/21  Yes McKenzie, Candee Furbish, MD  Prenatal Vit-Fe Fumarate-FA (PRENATAL PO) Take 1 tablet by mouth daily.   Yes [provider]  Probiotic Product (PROBIOTIC PO) Take 1 tablet by mouth every morning. Acidophilus 100 million   Yes [provider]  rOPINIRole (REQUIP) 1 MG tablet Take 1 mg by mouth at bedtime.   Yes [provider]  TURMERIC PO Take 1 tablet by mouth daily.   Yes [provider]  vitamin B-12 (CYANOCOBALAMIN) 1000 MCG tablet Take 1,000 mcg by mouth daily.   Yes [provider]  calcium carbonate (OSCAL) 1500 (600 Ca) MG TABS tablet Take 600 mg of elemental calcium by mouth daily. Patient not taking: Reported on 08/22/2021    [provider]  diphenoxylate-atropine (LOMOTIL) 2.5-0.025 MG tablet TAKE 1 TO 2 TABLETS BY MOUTH FOUR TIMES DAILY AS NEEDED FOR DIARRHEA OR LOOSE STOOLS Patient not taking: Reported on 07/31/2021 03/10/21   Owens Shark, NP  Methylcellulose, Laxative, (CITRUCEL PO) Take 1 tablet by mouth daily.    [provider]  Na Sulfate-K Sulfate-Mg Sulf 17.5-3.13-1.6 GM/177ML SOLN Take 1 kit by mouth as directed. May use generic Suprep Patient not taking: Reported on 08/08/2021 07/31/21   Cirigliano, Luanna Salk V, DO    Allergies    Prednisone and Codeine  Review of Systems   Review of Systems  Constitutional:  Negative for fever.  Eyes:  Negative for visual disturbance.  Respiratory:  Negative for cough and shortness of breath.   Cardiovascular:  Negative for chest pain.  Gastrointestinal:  Positive for abdominal pain.  Genitourinary:  Positive  for difficulty urinating and hematuria.  Musculoskeletal:  Positive for back pain. Negative for neck pain.  Skin:  Negative for rash.  Neurological:  Negative for syncope and headaches.   Physical Exam Updated Vital Signs BP 118/85 (BP Location: Right Arm)   Pulse 89   Temp 98.6 F (37 C) (Oral)   Resp 16   LMP  (LMP Unknown)  SpO2 99%   Physical Exam Vitals and nursing note reviewed.  Constitutional:      General: She is not in acute distress.    Appearance: Normal appearance. She is not ill-appearing, toxic-appearing or diaphoretic.  HENT:     Head: Normocephalic.  Eyes:     Conjunctiva/sclera: Conjunctivae normal.  Cardiovascular:     Rate and Rhythm: Normal rate and regular rhythm.     Pulses: Normal pulses.  Pulmonary:     Effort: Pulmonary effort is normal. No respiratory distress.  Abdominal:     Tenderness: There is abdominal tenderness (suprapbubic, diffuse).  Musculoskeletal:        General: No deformity or signs of injury.     Cervical back: No rigidity.  Skin:    General: Skin is warm and dry.     Coloration: Skin is not jaundiced or pale.  Neurological:     General: No focal deficit present.     Mental Status: She is alert and oriented to person, place, and time.    ED Results / Procedures / Treatments   Labs (all labs ordered are listed, but only abnormal results are displayed) Labs Reviewed - No data to display  EKG None  Radiology CT Renal Stone Study  Result Date: 08/21/2021 CLINICAL DATA:  Hematuria, history of prior distal right ureteral injury EXAM: CT ABDOMEN AND PELVIS WITHOUT CONTRAST TECHNIQUE: Multidetector CT imaging of the abdomen and pelvis was performed following the standard protocol without IV contrast. COMPARISON:  07/31/2021 FINDINGS: Lower chest: No acute abnormality. Hepatobiliary: Gallbladder is decompressed with multiple gallstones within. The liver shows the focal cyst in the left lobe of the liver. There is a 4.7 x 4.2 cm  hypodense area in the right lobe of the liver posterior consistent with prior microwave ablation. The overall appearance is similar to that seen on prior CT examination. Pancreas: Unremarkable. No pancreatic ductal dilatation or surrounding inflammatory changes. Spleen: Normal in size without focal abnormality. Adrenals/Urinary Tract: Adrenal glands are within normal limits. Left kidney is unremarkable. Right kidney shows no hydronephrosis. Right ureteral stent is seen in satisfactory position consistent with the known history. Bladder is decompressed by Foley catheter. The previously seen hyperdense ascites has resolved in the interval. Stomach/Bowel: No obstructive or inflammatory changes of the colon are seen. The appendix is within normal limits. Small bowel and stomach are unremarkable. Vascular/Lymphatic: No significant vascular findings are present. No enlarged abdominal or pelvic lymph nodes. Reproductive: Uterus and bilateral adnexa are unremarkable. Other: No abdominal wall hernia or abnormality. No abdominopelvic ascites. Musculoskeletal: Stable grade 2 anterolisthesis of L5 on S1 is noted. IMPRESSION: Multiple gallstones without complicating factors. Microwave ablation defect within the right lobe of the liver. Right ureteral stent in satisfactory position. The previously seen hyperdense ascites has resolved in the interval. Electronically Signed   By: Inez Catalina M.D.   On: 08/21/2021 20:09   CT Angio Abd/Pel w/ and/or w/o  Result Date: 08/21/2021 CLINICAL DATA:  Hematuria and left-sided abdominal pain EXAM: CTA ABDOMEN AND PELVIS WITHOUT AND WITH CONTRAST TECHNIQUE: Multidetector CT imaging of the abdomen and pelvis was performed using the standard protocol during bolus administration of intravenous contrast. Multiplanar reconstructed images and MIPs were obtained and reviewed to evaluate the vascular anatomy. CONTRAST:  20mL OMNIPAQUE IOHEXOL 350 MG/ML SOLN COMPARISON:  Noncontrast CT from  earlier in the same day. FINDINGS: VASCULAR Aorta: Abdominal aorta is within normal limits without aneurysmal dilatation or dissection. Celiac: Patent without evidence of aneurysm, dissection, vasculitis or  significant stenosis. SMA: Patent without evidence of aneurysm, dissection, vasculitis or significant stenosis. Renals: Both renal arteries are patent without evidence of aneurysm, dissection, vasculitis, fibromuscular dysplasia or significant stenosis. IMA: Diminutive but patent. Inflow: Iliacs are within normal limits. Veins: No acute venous abnormality is noted. Circumaortic left renal vein is seen. Review of the MIP images confirms the above findings. NON-VASCULAR Lower chest: No acute abnormality. Hepatobiliary: Microwave ablation defect is again identified in the right lobe of the liver. Multiple gallstones are seen and stable similar to that seen on prior exam. Pancreas: Unremarkable. No pancreatic ductal dilatation or surrounding inflammatory changes. Spleen: Normal in size without focal abnormality. Adrenals/Urinary Tract: Adrenal glands are within normal limits. Kidneys demonstrate a normal enhancement pattern. Again there are changes consistent with right ureteral stent. No renal calculi or urinary tract obstructive changes are seen. Delayed images demonstrate normal enhancement of the kidneys. Subsequent delayed images show mild fullness of the ureter on right with ureteral stent in place. Normal excretion of contrast is seen. Bladder is again decompressed by Foley catheter. Delayed images through the bladder demonstrates some filling defect adjacent to the Foley catheter which may represent a small amount of thrombus. This would account for the hematuria. This is best seen on coronal image number 96 of series 17. Stomach/Bowel: Postsurgical changes are seen in the rectum better visualized on the current exam due to the thinner slice sections no obstructive or inflammatory changes are seen. The  appendix is within normal limits. Small bowel and stomach are again within normal limits. Lymphatic: No significant lymphadenopathy is seen. Reproductive: Uterus and bilateral adnexa are unremarkable. Other: No abdominal wall hernia or abnormality. No abdominopelvic ascites. Musculoskeletal: Stable anterolisthesis of L5 on S1 is noted. IMPRESSION: VASCULAR No specific vascular abnormality is identified. NON-VASCULAR Filling defect is noted within the bladder on delayed images best noted on the coronal images consistent with mild hematoma within the bladder. This may be related to irritation from the Foley catheter or possibly ureteral stent. No other definitive abnormality is identified to correspond with these changes. Right ureteral stent in satisfactory position. Cholelithiasis without complicating factors. Microwave ablation defect within the right lobe of the liver. Electronically Signed   By: Inez Catalina M.D.   On: 08/21/2021 22:41    Procedures Procedures   Medications Ordered in ED Medications  HYDROmorphone (DILAUDID) injection 1 mg (1 mg Intravenous Given 08/22/21 1345)  oxyCODONE (Oxy IR/ROXICODONE) immediate release tablet 5 mg (5 mg Oral Given 08/22/21 1532)    ED Course  I have reviewed the triage vital signs and the nursing notes.  Pertinent labs & imaging results that were available during my care of the patient were reviewed by me and considered in my medical decision making (see chart for details).    MDM Rules/Calculators/A&P                           59 year old female with a history of rectal adenocarcinoma, history of reimplanted right ureter and distal right ureteral injury, who had a cystoscopy, right retrograde pyelogram with interpretation, right ureteral stent placement and Foley catheter placement completed by urology on October 27, seen by Dr. Alyson Ingles on November 11 as an outpatient with ureteral stricture noted and plan for repeat retrograde pyelogram in 3 weeks,  emergency department visit last night with concern for hematuria with hemoglobin found to be 12.8, bacteria and red blood cells noted on microscopic and CT stone study as well as  CT angio completed showing satisfactory right ureteral stent placement, hematoma in the bladder who presents with concern for obstruction of foley catheter.  Has not passed urine since last night, now severe pain. Do not suspect clinically significant change in labs or imaging since last night. Consulted Dr. Alyson Ingles Urology who came to evaluate her at bedside. Recommends removal of foley catheter and voiding trial. Removed and urinated twice with hematuria, significant volume of urine.  Discussed with Dr. Alyson Ingles who recommends macrobid, oxybutinin and follow up next week. Patient discharged in stable condition with understanding of reasons to return.   Final Clinical Impression(s) / ED Diagnoses Final diagnoses:  Obstruction of Foley catheter, initial encounter ([place])  Hematuria, unspecified type    Rx / DC Orders ED Discharge Orders          Ordered    oxybutynin (DITROPAN) 5 MG tablet  3 times daily        08/22/21 1514    nitrofurantoin, macrocrystal-monohydrate, (MACROBID) 100 MG capsule  2 times daily        08/22/21 1514             Gareth Morgan, MD 08/22/21 2320

## 2021-08-22 NOTE — ED Notes (Signed)
Removed catheter per MD verbal orders. Pt will attempt to urinate.

## 2021-08-22 NOTE — Telephone Encounter (Signed)
Patient called office this morning to let you know she went to the ER yesterday due to pain and hematuria. Please see ED note. Patient states that the urine is coming around the cath.  Please advise

## 2021-08-22 NOTE — Telephone Encounter (Signed)
Pt called pt she was have a lot of pain and feels the dye from her CT Scan  was going into systems. Pt was advised to go to  [facility] hospital and have the on call doctor paged .

## 2021-08-22 NOTE — ED Triage Notes (Signed)
Patient reports there is no urine draining into her foley bag since yesterday and states that she has blood pouring down her leg. Patient states she was here last night and had CT scans performed and was discharged. She is requesting that her urologist be paged.

## 2021-08-24 ENCOUNTER — Inpatient Hospital Stay (HOSPITAL_COMMUNITY)
Admission: EM | Admit: 2021-08-24 | Discharge: 2021-08-30 | DRG: 698 | Disposition: A | Discharge status: Home / Self Care | Payer: 59 | Attending: Obstetrics and Gynecology | Admitting: Obstetrics and Gynecology

## 2021-08-24 ENCOUNTER — Emergency Department (HOSPITAL_COMMUNITY): Payer: 59

## 2021-08-24 ENCOUNTER — Encounter (HOSPITAL_COMMUNITY): Payer: Self-pay | Admitting: Internal Medicine

## 2021-08-24 DIAGNOSIS — C787 Secondary malignant neoplasm of liver and intrahepatic bile duct: Secondary | ICD-10-CM | POA: Diagnosis present

## 2021-08-24 DIAGNOSIS — E872 Acidosis, unspecified: Secondary | ICD-10-CM | POA: Diagnosis present

## 2021-08-24 DIAGNOSIS — R059 Cough, unspecified: Secondary | ICD-10-CM

## 2021-08-24 DIAGNOSIS — N390 Urinary tract infection, site not specified: Secondary | ICD-10-CM | POA: Diagnosis not present

## 2021-08-24 DIAGNOSIS — A498 Other bacterial infections of unspecified site: Secondary | ICD-10-CM

## 2021-08-24 DIAGNOSIS — A4159 Other Gram-negative sepsis: Secondary | ICD-10-CM | POA: Diagnosis present

## 2021-08-24 DIAGNOSIS — T83511A Infection and inflammatory reaction due to indwelling urethral catheter, initial encounter: Principal | ICD-10-CM | POA: Diagnosis present

## 2021-08-24 DIAGNOSIS — E876 Hypokalemia: Secondary | ICD-10-CM

## 2021-08-24 DIAGNOSIS — Z888 Allergy status to other drugs, medicaments and biological substances status: Secondary | ICD-10-CM

## 2021-08-24 DIAGNOSIS — K5900 Constipation, unspecified: Secondary | ICD-10-CM | POA: Diagnosis not present

## 2021-08-24 DIAGNOSIS — R6520 Severe sepsis without septic shock: Secondary | ICD-10-CM | POA: Diagnosis present

## 2021-08-24 DIAGNOSIS — Y846 Urinary catheterization as the cause of abnormal reaction of the patient, or of later complication, without mention of misadventure at the time of the procedure: Secondary | ICD-10-CM | POA: Diagnosis present

## 2021-08-24 DIAGNOSIS — Z20822 Contact with and (suspected) exposure to covid-19: Secondary | ICD-10-CM | POA: Diagnosis present

## 2021-08-24 DIAGNOSIS — A419 Sepsis, unspecified organism: Secondary | ICD-10-CM | POA: Diagnosis not present

## 2021-08-24 DIAGNOSIS — M419 Scoliosis, unspecified: Secondary | ICD-10-CM | POA: Diagnosis present

## 2021-08-24 DIAGNOSIS — F32A Depression, unspecified: Secondary | ICD-10-CM | POA: Diagnosis present

## 2021-08-24 DIAGNOSIS — M1990 Unspecified osteoarthritis, unspecified site: Secondary | ICD-10-CM | POA: Diagnosis present

## 2021-08-24 DIAGNOSIS — Z1611 Resistance to penicillins: Secondary | ICD-10-CM | POA: Diagnosis present

## 2021-08-24 DIAGNOSIS — Z885 Allergy status to narcotic agent status: Secondary | ICD-10-CM

## 2021-08-24 DIAGNOSIS — Z7982 Long term (current) use of aspirin: Secondary | ICD-10-CM

## 2021-08-24 DIAGNOSIS — J9811 Atelectasis: Secondary | ICD-10-CM | POA: Diagnosis present

## 2021-08-24 DIAGNOSIS — E871 Hypo-osmolality and hyponatremia: Secondary | ICD-10-CM | POA: Diagnosis present

## 2021-08-24 DIAGNOSIS — Z936 Other artificial openings of urinary tract status: Secondary | ICD-10-CM

## 2021-08-24 DIAGNOSIS — G2581 Restless legs syndrome: Secondary | ICD-10-CM | POA: Diagnosis present

## 2021-08-24 DIAGNOSIS — G43909 Migraine, unspecified, not intractable, without status migrainosus: Secondary | ICD-10-CM | POA: Diagnosis present

## 2021-08-24 DIAGNOSIS — C20 Malignant neoplasm of rectum: Secondary | ICD-10-CM | POA: Diagnosis present

## 2021-08-24 DIAGNOSIS — Z79899 Other long term (current) drug therapy: Secondary | ICD-10-CM

## 2021-08-24 DIAGNOSIS — Z1619 Resistance to other specified beta lactam antibiotics: Secondary | ICD-10-CM | POA: Diagnosis present

## 2021-08-24 DIAGNOSIS — F419 Anxiety disorder, unspecified: Secondary | ICD-10-CM | POA: Diagnosis present

## 2021-08-24 DIAGNOSIS — E861 Hypovolemia: Secondary | ICD-10-CM | POA: Diagnosis present

## 2021-08-24 DIAGNOSIS — Z87891 Personal history of nicotine dependence: Secondary | ICD-10-CM

## 2021-08-24 DIAGNOSIS — N135 Crossing vessel and stricture of ureter without hydronephrosis: Secondary | ICD-10-CM | POA: Diagnosis present

## 2021-08-24 DIAGNOSIS — Z8261 Family history of arthritis: Secondary | ICD-10-CM

## 2021-08-24 DIAGNOSIS — Z87442 Personal history of urinary calculi: Secondary | ICD-10-CM

## 2021-08-24 LAB — CBC WITH DIFFERENTIAL/PLATELET
Abs Immature Granulocytes: 0.03 10*3/uL (ref 0.00–0.07)
Basophils Absolute: 0 10*3/uL (ref 0.0–0.1)
Basophils Relative: 0 %
Eosinophils Absolute: 0.1 10*3/uL (ref 0.0–0.5)
Eosinophils Relative: 1 %
HCT: 37 % (ref 36.0–46.0)
Hemoglobin: 12.1 g/dL (ref 12.0–15.0)
Immature Granulocytes: 0 %
Lymphocytes Relative: 9 %
Lymphs Abs: 0.7 10*3/uL (ref 0.7–4.0)
MCH: 30.7 pg (ref 26.0–34.0)
MCHC: 32.7 g/dL (ref 30.0–36.0)
MCV: 93.9 fL (ref 80.0–100.0)
Monocytes Absolute: 0.5 10*3/uL (ref 0.1–1.0)
Monocytes Relative: 7 %
Neutro Abs: 6.7 10*3/uL (ref 1.7–7.7)
Neutrophils Relative %: 83 %
Platelets: 196 10*3/uL (ref 150–400)
RBC: 3.94 MIL/uL (ref 3.87–5.11)
RDW: 13.4 % (ref 11.5–15.5)
WBC: 8 10*3/uL (ref 4.0–10.5)
nRBC: 0 % (ref 0.0–0.2)

## 2021-08-24 LAB — COMPREHENSIVE METABOLIC PANEL
ALT: 24 U/L (ref 0–44)
AST: 29 U/L (ref 15–41)
Albumin: 3.6 g/dL (ref 3.5–5.0)
Alkaline Phosphatase: 95 U/L (ref 38–126)
Anion gap: 11 (ref 5–15)
BUN: 22 mg/dL — ABNORMAL HIGH (ref 6–20)
CO2: 25 mmol/L (ref 22–32)
Calcium: 8.7 mg/dL — ABNORMAL LOW (ref 8.9–10.3)
Chloride: 99 mmol/L (ref 98–111)
Creatinine, Ser: 1.18 mg/dL — ABNORMAL HIGH (ref 0.44–1.00)
GFR, Estimated: 54 mL/min — ABNORMAL LOW (ref >60)
Glucose, Bld: 104 mg/dL — ABNORMAL HIGH (ref 70–99)
Potassium: 3.3 mmol/L — ABNORMAL LOW (ref 3.5–5.1)
Sodium: 135 mmol/L (ref 135–145)
Total Bilirubin: 1.1 mg/dL (ref 0.3–1.2)
Total Protein: 8.2 g/dL — ABNORMAL HIGH (ref 6.5–8.1)

## 2021-08-24 LAB — URINE CULTURE: Culture: >=100000 — AB

## 2021-08-24 LAB — RESP PANEL BY RT-PCR (FLU A&B, COVID) ARPGX2
Influenza A by PCR: NEGATIVE
Influenza B by PCR: NEGATIVE
SARS Coronavirus 2 by RT PCR: NEGATIVE

## 2021-08-24 LAB — URINALYSIS, ROUTINE W REFLEX MICROSCOPIC
Bilirubin Urine: NEGATIVE
Glucose, UA: NEGATIVE mg/dL
Ketones, ur: NEGATIVE mg/dL
Nitrite: NEGATIVE
Protein, ur: >300 mg/dL — AB
Specific Gravity, Urine: 1.025 (ref 1.005–1.030)
pH: 6 (ref 5.0–8.0)

## 2021-08-24 LAB — URINALYSIS, MICROSCOPIC (REFLEX): Squamous Epithelial / HPF: >50 (ref 0–5)

## 2021-08-24 MED ORDER — ROPINIROLE HCL 1 MG PO TABS
1.0000 mg | ORAL_TABLET | Freq: Every day | ORAL | Status: DC
  Administered 2021-08-25 – 2021-08-29 (×6): 1 mg via ORAL
  Filled 2021-08-24 (×7): qty 1

## 2021-08-24 MED ORDER — ALPRAZOLAM 1 MG PO TABS
2.0000 mg | ORAL_TABLET | Freq: Every morning | ORAL | Status: DC
  Administered 2021-08-25 – 2021-08-30 (×6): 2 mg via ORAL
  Filled 2021-08-24: qty 4
  Filled 2021-08-24 (×5): qty 2

## 2021-08-24 MED ORDER — SODIUM CHLORIDE 0.9 % IV BOLUS
1000.0000 mL | Freq: Once | INTRAVENOUS | Status: AC
  Administered 2021-08-24: 1000 mL via INTRAVENOUS

## 2021-08-24 MED ORDER — CYCLOBENZAPRINE HCL 10 MG PO TABS
20.0000 mg | ORAL_TABLET | Freq: Every day | ORAL | Status: DC
  Administered 2021-08-25 – 2021-08-29 (×6): 20 mg via ORAL
  Filled 2021-08-24 (×6): qty 2

## 2021-08-24 MED ORDER — ACETAMINOPHEN 650 MG RE SUPP: 650.0000 mg | Freq: Four times a day (QID) | RECTAL | Status: DC | PRN

## 2021-08-24 MED ORDER — ACETAMINOPHEN 325 MG PO TABS
650.0000 mg | ORAL_TABLET | Freq: Four times a day (QID) | ORAL | Status: DC | PRN
  Administered 2021-08-28: 650 mg via ORAL
  Filled 2021-08-24: qty 2

## 2021-08-24 MED ORDER — SODIUM CHLORIDE 0.9 % IV SOLN
2.0000 g | Freq: Three times a day (TID) | INTRAVENOUS | Status: DC
  Administered 2021-08-25 – 2021-08-30 (×17): 2 g via INTRAVENOUS
  Filled 2021-08-24 (×18): qty 2

## 2021-08-24 MED ORDER — BUPROPION HCL ER (XL) 300 MG PO TB24
300.0000 mg | ORAL_TABLET | Freq: Every morning | ORAL | Status: DC
  Administered 2021-08-25 – 2021-08-30 (×6): 300 mg via ORAL
  Filled 2021-08-24: qty 2
  Filled 2021-08-24 (×5): qty 1

## 2021-08-24 MED ORDER — OXYCODONE HCL 5 MG PO TABS
5.0000 mg | ORAL_TABLET | ORAL | Status: DC | PRN
  Administered 2021-08-25 – 2021-08-29 (×12): 5 mg via ORAL
  Filled 2021-08-24 (×13): qty 1

## 2021-08-24 MED ORDER — LIDOCAINE-PRILOCAINE 2.5-2.5 % EX CREA
TOPICAL_CREAM | Freq: Once | CUTANEOUS | Status: AC
  Filled 2021-08-24: qty 5

## 2021-08-24 MED ORDER — KETOROLAC TROMETHAMINE 30 MG/ML IJ SOLN
15.0000 mg | Freq: Once | INTRAMUSCULAR | Status: AC
  Administered 2021-08-24: 15 mg via INTRAVENOUS
  Filled 2021-08-24: qty 1

## 2021-08-24 MED ORDER — OXYBUTYNIN CHLORIDE 5 MG PO TABS
5.0000 mg | ORAL_TABLET | Freq: Three times a day (TID) | ORAL | Status: DC
  Administered 2021-08-25 – 2021-08-30 (×17): 5 mg via ORAL
  Filled 2021-08-24 (×17): qty 1

## 2021-08-24 MED ORDER — PROCHLORPERAZINE EDISYLATE 10 MG/2ML IJ SOLN
5.0000 mg | Freq: Once | INTRAMUSCULAR | Status: AC
  Administered 2021-08-24: 5 mg via INTRAVENOUS
  Filled 2021-08-24: qty 2

## 2021-08-24 MED ORDER — ACETAMINOPHEN 500 MG PO TABS
1000.0000 mg | ORAL_TABLET | Freq: Once | ORAL | Status: AC
  Administered 2021-08-24: 1000 mg via ORAL
  Filled 2021-08-24: qty 2

## 2021-08-24 MED ORDER — LACTATED RINGERS IV SOLN: INTRAVENOUS | Status: AC

## 2021-08-24 MED ORDER — SODIUM CHLORIDE 0.9 % IV SOLN
1.0000 g | Freq: Once | INTRAVENOUS | Status: AC
  Administered 2021-08-24: 1 g via INTRAVENOUS
  Filled 2021-08-24: qty 10

## 2021-08-24 NOTE — ED Notes (Signed)
Patient transported to CT 

## 2021-08-24 NOTE — H&P (Signed)
History and Physical    Norma Colon KPT:[id number] DOB: 01/26/1961 DOA: 08/24/2021  PCP: Dian Queen, MD  Patient coming from: Home.  Chief Complaint: Fever and dysuria.  HPI: Norma Colon is a 60 y.o. female with history of metastatic rectal carcinoma who has had a procedure for ablation of the liver lesion in October following which patient had a ureteral injury stent placed and come to the ER 2 days ago after patient had hematuria and increasing pain during which patient's Foley catheter was removed and patient was placed on antibiotics.  Despite taking antibiotics patient was still having fever chills according temperatures up to 104 at home.  Patient presents to the ER.  Denies any chest pain or shortness of breath nausea or vomiting or diarrhea.  ED Course: In the ER patient blood pressure is in the low 17G systolic and UA is concerning for UTI CT renal study shows stable stent of the right ureter.  COVID test was negative.  Patient had blood cultures drawn started on empiric antibiotic.  Possible sepsis related to urinary tract infection.  Patient's urine cultures 2 days ago grew Enterobacter cloacae sensitive to cefepime which has been started.  Review of Systems: As per HPI, rest all negative.   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[facility]) oncologist-- dr Benay Spice--  per last note in epic ,  clinical remission   dx 08/ 2019---- chemoradiation concurrent completed 07-20-2018;  s/p  low anterior resection 09-16-2018;   chemo 10-17-2018  ot 12-19-2018   Anxiety    Arthritis    Chemotherapy induced neutropenia ([place])    Depression    Headache    migraines    History of HPV infection    History of kidney stones    Hydronephrosis, right    Restless leg syndrome    Scoliosis    Sepsis ([place])    11-2018    Past Surgical History:  Procedure Laterality Date   COLONOSCOPY     COLPOSCOPY  04/04/2018   CYSTOSCOPY W/ RETROGRADES Right  07/31/2021   Procedure: Council, URETEROSCOPY AND STENT PLACEMENT;  Surgeon: Janith Lima, MD;  Location: [facility];  Service: Urology;  Laterality: Right;   CYSTOSCOPY WITH RETROGRADE PYELOGRAM, URETEROSCOPY AND STENT PLACEMENT Right 12/12/2018   Procedure: CYSTOSCOPY WITH RETROGRADE PYELOGRAM, URETEROSCOPY, STONE EXTRACTION AND STENT PLACEMENT;  Surgeon: Cleon Gustin, MD;  Location: [facility];  Service: Urology;  Laterality: Right;   CYSTOSCOPY WITH RETROGRADE PYELOGRAM, URETEROSCOPY AND STENT PLACEMENT Right 04/17/2019   Procedure: CYSTOSCOPY WITH RETROGRADE PYELOGRAM, BALLOON [place]  URETEROSCOPY AND STENT PLACEMENT;  Surgeon: Cleon Gustin, MD;  Location: [facility];  Service: Urology;  Laterality: Right;   CYSTOSCOPY WITH STENT PLACEMENT Right 11/21/2018   Procedure: CYSTOSCOPY, RIGHT RETROGRADE PYELOGRAM, WITH RIGHT URETERAL STENT PLACEMENT;  Surgeon: Cleon Gustin, MD;  Location: [facility];  Service: Urology;  Laterality: Right;   IR IMAGING GUIDED PORT INSERTION  09/04/2020   IR NEPHROSTOMY PLACEMENT RIGHT  06/22/2019   IR PATIENT EVAL TECH 0-60 MINS  06/28/2019   IR RADIOLOGIST EVAL & MGMT  06/06/2020   IR RADIOLOGIST EVAL & MGMT  07/17/2020   IR RADIOLOGIST EVAL & MGMT  08/15/2020   IR RADIOLOGIST EVAL & MGMT  09/03/2020   IR RADIOLOGIST EVAL & MGMT  12/18/2020   IR RADIOLOGIST EVAL & MGMT  02/25/2021   IR RADIOLOGIST EVAL & MGMT  06/23/2021   IR  RADIOLOGIST EVAL & MGMT  08/12/2021   left forearm surgery due to calcium rock      RADIOLOGY WITH ANESTHESIA N/A 06/19/2020   Procedure: CT WITH ANESTHESIA MICROWAVE ABLATION;  Surgeon: Jacqulynn Cadet, MD;  Location: [facility];  Service: Anesthesiology;  Laterality: N/A;   RADIOLOGY WITH ANESTHESIA N/A 07/30/2021   Procedure: CT MICROWAVE ABLATION OF THE LIVER;  Surgeon: Criselda Peaches, MD;  Location: [facility];  Service: Radiology;  Laterality:  N/A;   ROBOT ASSISTED PYELOPLASTY Right 08/03/2019   Procedure: XI ROBOTIC ASSISTED PYELOPLASTY WITH STENT PLACEMENT;  Surgeon: Cleon Gustin, MD;  Location: [facility];  Service: Urology;  Laterality: Right;  3 HRS   SHOULDER ARTHROSCOPY W/ ROTATOR CUFF REPAIR Right    XI ROBOTIC ASSISTED LOWER ANTERIOR RESECTION N/A 09/16/2018   Procedure: XI ROBOTIC ASSISTED LOWER ANTERIOR RESECTION ERAS PATHWAY;  Surgeon: Leighton Ruff, MD;  Location: [facility];  Service: General;  Laterality: N/A;     reports that she quit smoking about 2 years ago. Her smoking use included cigarettes. She has a 30.00 pack-year smoking history. She has never used smokeless tobacco. She reports current alcohol use. She reports that she does not use drugs.  Allergies  Allergen Reactions   Prednisone Other (See Comments)    "Felt like she was crazy" if on it more than 1 week   Codeine Other (See Comments)    Causes migraine--patient requests this be kept on allergy list    Family History  Problem Relation Age of Onset   COPD Father        smoker   Stomach cancer Father        spread to lungs and bones   Arthritis Mother    Heart attack Paternal Grandmother    Heart attack Paternal Grandfather    Heart attack Maternal Grandmother    Stroke Maternal Grandmother    Colon cancer Neg Hx    Rectal cancer Neg Hx    Esophageal cancer Neg Hx     Prior to Admission medications   Medication Sig Start Date End Date Taking? Authorizing Provider  alprazolam Duanne Moron) 2 MG tablet Take 2 mg by mouth every morning.     [provider]  Ascorbic Acid (VITAMIN C) 1000 MG tablet Take 1,000 mg by mouth daily.    [provider]  aspirin EC 81 MG tablet Take 81 mg by mouth every morning. Swallow whole.     [provider]  Biotin 10 MG CAPS Take 10 mg by mouth daily.    [provider]  buPROPion (WELLBUTRIN XL) 300 MG 24 hr tablet Take 300 mg by mouth every morning.     [provider]   calcium carbonate (OSCAL) 1500 (600 Ca) MG TABS tablet Take 600 mg of elemental calcium by mouth daily. Patient not taking: Reported on 08/22/2021    [provider]  Cholecalciferol (DIALYVITE VITAMIN D 5000) 125 MCG (5000 UT) capsule Take 5,000 Units by mouth daily.    [provider]  clotrimazole-betamethasone (LOTRISONE) cream Apply 1 application topically 2 (two) times daily. 08/15/21   McKenzie, Candee Furbish, MD  cyclobenzaprine (FLEXERIL) 10 MG tablet Take 20 mg by mouth at bedtime.    [provider]  diphenoxylate-atropine (LOMOTIL) 2.5-0.025 MG tablet TAKE 1 TO 2 TABLETS BY MOUTH FOUR TIMES DAILY AS NEEDED FOR DIARRHEA OR LOOSE STOOLS Patient not taking: Reported on 07/31/2021 03/10/21   Owens Shark, NP  [facility] C PO Take 1 tablet  by mouth daily.    [provider]  HYDROcodone-acetaminophen (NORCO) 10-325 MG tablet Take 1-2 tablets by mouth every 6 (six) hours as needed for moderate pain or severe pain (headache). Post-Operatively. 08/05/19   Alexis Frock, MD  lidocaine-prilocaine (EMLA) cream Apply 1 application topically as directed. Apply 1-2 hours prior to IV stick and cover with plastic wrap Patient taking differently: Apply 1 application topically daily as needed (port access). 11/28/20   Ladell Pier, MD  lubiprostone (AMITIZA) 24 MCG capsule Take 1 capsule (24 mcg total) by mouth 2 (two) times daily with a meal. 07/16/21 01/07/23  Cirigliano, Vito V, DO  METAMUCIL FIBER PO Take 1 capsule by mouth daily.    [provider]  Methylcellulose, Laxative, (CITRUCEL PO) Take 1 tablet by mouth daily.    [provider]  Multiple Vitamin (MULTIVITAMIN WITH MINERALS) TABS tablet Take 1 tablet by mouth daily.    [provider]  Na Sulfate-K Sulfate-Mg Sulf 17.5-3.13-1.6 GM/177ML SOLN Take 1 kit by mouth as directed. May use generic Suprep Patient not taking: Reported on 08/08/2021 07/31/21   Cirigliano, Dominic Pea,  DO  nitrofurantoin, macrocrystal-monohydrate, (MACROBID) 100 MG capsule Take 1 capsule (100 mg total) by mouth 2 (two) times daily for 7 days. 08/22/21 08/29/21  Gareth Morgan, MD  Omega-3 Fatty Acids (FISH OIL) 1000 MG CAPS Take 1,000 mg by mouth daily.    [provider]  oxybutynin (DITROPAN) 5 MG tablet Take 1 tablet (5 mg total) by mouth 3 (three) times daily for 7 days. 08/22/21 08/29/21  Gareth Morgan, MD  oxyCODONE-acetaminophen (PERCOCET/ROXICET) 5-325 MG tablet Take 1-2 tablets by mouth every 6 (six) hours as needed for severe pain. 08/07/21   McKenzie, Candee Furbish, MD  Prenatal Vit-Fe Fumarate-FA (PRENATAL PO) Take 1 tablet by mouth daily.    [provider]  Probiotic Product (PROBIOTIC PO) Take 1 tablet by mouth every morning. Acidophilus 100 million    [provider]  rOPINIRole (REQUIP) 1 MG tablet Take 1 mg by mouth at bedtime.    [provider]  TURMERIC PO Take 1 tablet by mouth daily.    [provider]  vitamin B-12 (CYANOCOBALAMIN) 1000 MCG tablet Take 1,000 mcg by mouth daily.    [provider]    Physical Exam: Constitutional: Moderately built and nourished. Vitals:   08/24/21 2132 08/24/21 2200 08/24/21 2230 08/24/21 2300  BP: (!) 82/56 93/65 (!) 84/53 (!) 95/52  Pulse: 95 87 86 78  Resp: _0 (!) 23  Temp:      TempSrc:      SpO2: 96% 96% 95% 94%  Weight:      Height:       Eyes: Anicteric no pallor. ENMT: No discharge from the ears eyes nose and mouth. Neck: No mass felt.  No neck rigidity. Respiratory: No rhonchi or crepitations. Cardiovascular: S1-S2 heard. Abdomen: Soft mild tenderness in the suprapubic area with no rebound tenderness.  No guarding or rigidity. Musculoskeletal: No edema. Skin: No rash. Neurologic: Alert awake oriented to time place and person.  Moves all extremities. Psychiatric: Appears normal.  Normal affect.   Labs on Admission: I have personally reviewed following  labs and imaging studies  CBC: Recent Labs  Lab 08/21/21 2114 08/24/21 1800  WBC 8.1 8.0  NEUTROABS 5.5 6.7  HGB 12.8 12.1  HCT 39.7 37.0  MCV 94.7 93.9  PLT 266 824   Basic Metabolic Panel: Recent Labs  Lab 08/21/21 2118 08/24/21 1800  NA 139 135  K 3.8 3.3*  CL 104 99  CO2 28 25  GLUCOSE 123* 104*  BUN 24* 22*  CREATININE 1.13* 1.18*  CALCIUM 9.3 8.7*   GFR: Estimated Creatinine Clearance: 63.6 mL/min (A) (by C-G formula based on SCr of 1.18 mg/dL (H)). Liver Function Tests: Recent Labs  Lab 08/21/21 2118 08/24/21 1800  AST 19 29  ALT 22 24  ALKPHOS 99 95  BILITOT 0.7 1.1  PROT 7.5 8.2*  ALBUMIN 3.9 3.6   No results for input(s): LIPASE, AMYLASE in the last 168 hours. No results for input(s): AMMONIA in the last 168 hours. Coagulation Profile: No results for input(s): INR, PROTIME in the last 168 hours. Cardiac Enzymes: No results for input(s): CKTOTAL, CKMB, CKMBINDEX, TROPONINI in the last 168 hours. BNP (last 3 results) No results for input(s): PROBNP in the last 8760 hours. HbA1C: No results for input(s): HGBA1C in the last 72 hours. CBG: No results for input(s): GLUCAP in the last 168 hours. Lipid Profile: No results for input(s): CHOL, HDL, LDLCALC, TRIG, CHOLHDL, LDLDIRECT in the last 72 hours. Thyroid Function Tests: No results for input(s): TSH, T4TOTAL, FREET4, T3FREE, THYROIDAB in the last 72 hours. Anemia Panel: No results for input(s): VITAMINB12, FOLATE, FERRITIN, TIBC, IRON, RETICCTPCT in the last 72 hours. Urine analysis:    Component Value Date/Time   COLORURINE YELLOW 08/24/2021 2017   APPEARANCEUR CLEAR 08/24/2021 2017   LABSPEC 1.025 08/24/2021 2017   PHURINE 6.0 08/24/2021 2017   GLUCOSEU NEGATIVE 08/24/2021 2017   HGBUR LARGE (A) 08/24/2021 2017   BILIRUBINUR NEGATIVE 08/24/2021 2017   KETONESUR NEGATIVE 08/24/2021 2017   PROTEINUR >300 (A) 08/24/2021 2017   NITRITE NEGATIVE 08/24/2021 2017   LEUKOCYTESUR MODERATE (A)  08/24/2021 2017   Sepsis Labs: _0 (procalcitonin:4,lacticidven:4) ) Recent Results (from the past 240 hour(s))  Urine Culture     Status: Abnormal   Collection Time: 08/21/21 11:04 PM   Specimen: Urine, Catheterized  Result Value Ref Range Status   Specimen Description   Final    URINE, CATHETERIZED Performed at [facility], [place] [address], [place], [place] [address]    Special Requests   Final    NONE Performed at [facility], [place] [address] [place], [place] [address]    Culture >=100,000 COLONIES/mL ENTEROBACTER CLOACAE (A)  Final   Report Status 08/24/2021 FINAL  Final   Organism ID, Bacteria ENTEROBACTER CLOACAE (A)  Final      Susceptibility   Enterobacter cloacae - MIC*    CEFAZOLIN >=64 RESISTANT Resistant     CEFEPIME <=0.12 SENSITIVE Sensitive     CIPROFLOXACIN <=0.25 SENSITIVE Sensitive     GENTAMICIN <=1 SENSITIVE Sensitive     IMIPENEM 1 SENSITIVE Sensitive     NITROFURANTOIN 32 SENSITIVE Sensitive     TRIMETH/SULFA <=20 SENSITIVE Sensitive     PIP/TAZO >=128 RESISTANT Resistant     * >=100,000 COLONIES/mL ENTEROBACTER CLOACAE  Resp Panel by RT-PCR (Flu A&B, Covid) Nasopharyngeal Swab     Status: None   Collection Time: 08/24/21  4:30 PM   Specimen: Nasopharyngeal Swab; Nasopharyngeal(NP) swabs in vial transport medium  Result Value Ref Range Status   SARS Coronavirus 2 by RT PCR NEGATIVE NEGATIVE Final    Comment: (NOTE) SARS-CoV-2 target nucleic acids are NOT DETECTED.  The SARS-CoV-2 RNA is generally detectable in upper respiratory specimens during the acute phase of infection. The lowest concentration of SARS-CoV-2 viral copies this assay can detect is 138 copies/mL. A  negative result does not preclude SARS-Cov-2 infection and should not be used as the sole basis for treatment or other patient management decisions. A negative result may occur with  improper specimen collection/handling, submission of  specimen other than nasopharyngeal swab, presence of viral mutation(s) within the areas targeted by this assay, and inadequate number of viral copies(<138 copies/mL). A negative result must be combined with clinical observations, patient history, and epidemiological information. The expected result is Negative.  Fact Sheet for Patients:  EntrepreneurPulse.com.au  Fact Sheet for Healthcare Providers:  IncredibleEmployment.be  This test is no t yet approved or cleared by the [place] FDA and  has been authorized for detection and/or diagnosis of SARS-CoV-2 by FDA under an Emergency Use Authorization (EUA). This EUA will remain  in effect (meaning this test can be used) for the duration of the COVID-19 declaration under Section 564(b)(1) of the Act, 21 U.S.C.section 360bbb-3(b)(1), unless the authorization is terminated  or revoked sooner.       Influenza A by PCR NEGATIVE NEGATIVE Final   Influenza B by PCR NEGATIVE NEGATIVE Final    Comment: (NOTE) The Xpert Xpress SARS-CoV-2/FLU/RSV plus assay is intended as an aid in the diagnosis of influenza from Nasopharyngeal swab specimens and should not be used as a sole basis for treatment. Nasal washings and aspirates are unacceptable for Xpert Xpress SARS-CoV-2/FLU/RSV testing.  Fact Sheet for Patients: EntrepreneurPulse.com.au  Fact Sheet for Healthcare Providers: IncredibleEmployment.be  This test is not yet approved or cleared by the [place] FDA and has been authorized for detection and/or diagnosis of SARS-CoV-2 by FDA under an Emergency Use Authorization (EUA). This EUA will remain in effect (meaning this test can be used) for the duration of the COVID-19 declaration under Section 564(b)(1) of the Act, 21 U.S.C. section 360bbb-3(b)(1), unless the authorization is terminated or revoked.  Performed at [facility], [place]  [address] [place], [place] [address]      Radiological Exams on Admission: CT Renal Stone Study  Result Date: 08/24/2021 CLINICAL DATA:  Fever, recent Foley catheter remote, dysuria, history of metastatic rectal cancer to liver status post liver ablation EXAM: CT ABDOMEN AND PELVIS WITHOUT CONTRAST TECHNIQUE: Multidetector CT imaging of the abdomen and pelvis was performed following the standard protocol without IV contrast. Unenhanced CT was performed per clinician order. Lack of IV contrast limits sensitivity and specificity, especially for evaluation of abdominal/pelvic solid viscera. COMPARISON:  08/21/2021 FINDINGS: Lower chest: Subsegmental atelectasis within the lower lobes. No acute pleural or parenchymal lung disease. Hepatobiliary: 4.3 x 3.6 cm hypodensity within the inferior aspect right lobe liver again identified, consistent with the site of prior ablation. Previously this had measured 4.8 x 4.3 cm. Remainder of the liver is unremarkable. High attenuation material within the gallbladder consistent with vicarious excretion of previously administered contrast. Cholelithiasis is again noted without evidence of cholecystitis. Pancreas: Unremarkable. No pancreatic ductal dilatation or surrounding inflammatory changes. Spleen: Normal in size without focal abnormality. Adrenals/Urinary Tract: A right ureteral stent is again identified extending from the right renal pelvis into the bladder lumen. No change in the right Peri ureteral fat stranding seen previously. 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 The left kidney is unremarkable.  No urinary tract calculi. Bladder is decompressed, limiting its evaluation. The adrenals are unremarkable. Stomach/Bowel: No bowel obstruction or ileus. Normal appendix right lower quadrant. No bowel wall thickening or inflammatory change. Vascular/Lymphatic: No significant vascular findings are present.  No enlarged  abdominal or pelvic lymph nodes. Reproductive: Uterus and bilateral adnexa are unremarkable. Other: No free fluid or free gas.  No abdominal wall hernia. Musculoskeletal: No acute or destructive bony lesions. There is grade 2 anterior spondylolisthesis of L5 on S1 unchanged. Reconstructed images demonstrate no additional findings. IMPRESSION: 1. Stable position of the right ureteral stent. No change in the right Peri ureteral fat stranding seen previously. 2. Decreased size of the right lobe liver hypodensity at the site of prior microwave ablation. 3. Cholelithiasis without acute cholecystitis. Electronically Signed   By: Randa Ngo M.D.   On: 08/24/2021 21:51     Assessment/Plan Principal Problem:   Sepsis ([place]) Active Problems:   Adenocarcinoma of rectum ([place])   Acute lower UTI    Sepsis likely source urinary tract infection recent urine cultures 2 days ago grew Enterobacter sensitive to cefepime.  Discussed with pharmacy we will continue with cefepime continue with hydration.  Blood pressure is responding to fluids.  But if it further decreases may need to start on vasopressors.  Follow lactic acid and cultures.  Patient has a stent in ureter which appears to be stable.  May need to discuss with urologist given the sepsis. Metastatic rectal carcinoma has had ablation of the liver lesions in October.  Followed by Dr. Betsy Coder oncologist. History of anxiety takes Xanax and Wellbutrin.     DVT prophylaxis: SCDs.  Since patient states she is having heavy hematuria we will closely monitor for any further drop in hemoglobin or if is stable may start Lovenox. Code Status: Full code. Family Communication: Discussed with patient. Disposition Plan: Home. Consults called: None. Admission status: Observation.   Rise Patience MD [facility] Pager [phone].  If 7PM-7AM, please contact night-coverage [email or web address] Password [facility]  08/24/2021, 11:47 PM

## 2021-08-24 NOTE — ED Triage Notes (Signed)
Patient reports she had surgery on liver on 07/30/21, had catheter in, catheter was removed two days ago, patient states she ran fever all night of 104 until 3 am when fever was 103. 103 at 9 am. 102 at all. Patient took two tylenol pta. Patient says she is achy, has pain with urination.

## 2021-08-24 NOTE — ED Notes (Signed)
Patient took her own Hydrocodone while RN was in room. She has already swallowed pill before RN could inform MD.

## 2021-08-24 NOTE — ED Provider Notes (Signed)
[facility] DEPT Provider Note   CSN: [id number] Arrival date & time: 08/24/21  1127     History Chief Complaint  Patient presents with   Fever    Norma Colon is a 60 y.o. female.  HPI Patient with multiple medical issues including rectal adenocarcinoma, right ureteral injury, recent removal of Foley catheter now presents with pain in the right abdomen which she has been experiencing for some time as well as headache. She also had fever earlier in the day.  Pain on the right side is sore, persistent, severe.  Headache is diffuse.  No relief with Tylenol beyond the fever going down.  She is here with a female companion who assists with history.  No vomiting, no fever.  She continues to have gross hematuria, but is now able to urinate spontaneously.    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[facility]) oncologist-- dr Benay Spice--  per last note in epic ,  clinical remission   dx 08/ 2019---- chemoradiation concurrent completed 07-20-2018;  s/p  low anterior resection 09-16-2018;   chemo 10-17-2018  ot 12-19-2018   Anxiety    Arthritis    Chemotherapy induced neutropenia ([place])    Depression    Headache    migraines    History of HPV infection    History of kidney stones    Hydronephrosis, right    Restless leg syndrome    Scoliosis    Sepsis ([place])    11-2018    Patient Active Problem List   Diagnosis Date Noted   Colon cancer metastasized to liver ([place]) 07/30/2021   Goals of care, counseling/discussion 09/12/2020   Rectal cancer metastasized to liver ([place]) 06/19/2020   Ureteral stricture 08/03/2019   Insomnia    Pancytopenia ([place]) 11/21/2018   Right ureteral stone 11/21/2018   Acute lower UTI 11/21/2018   Sepsis ([place]) 11/21/2018   Acute cystitis with hematuria    Rectal cancer ([place]) 09/16/2018   Adenocarcinoma of rectum ([place]) 05/30/2018    Past Surgical History:  Procedure Laterality Date   COLONOSCOPY      COLPOSCOPY  04/04/2018   CYSTOSCOPY W/ RETROGRADES Right 07/31/2021   Procedure: CYSTOSCOPY WITH RETROGRADE PYELOGRAM, URETEROSCOPY AND STENT PLACEMENT;  Surgeon: Janith Lima, MD;  Location: [place];  Service: Urology;  Laterality: Right;   CYSTOSCOPY WITH RETROGRADE PYELOGRAM, URETEROSCOPY AND STENT PLACEMENT Right 12/12/2018   Procedure: CYSTOSCOPY WITH RETROGRADE PYELOGRAM, URETEROSCOPY, STONE EXTRACTION AND STENT PLACEMENT;  Surgeon: Cleon Gustin, MD;  Location: [facility];  Service: Urology;  Laterality: Right;   CYSTOSCOPY WITH RETROGRADE PYELOGRAM, URETEROSCOPY AND STENT PLACEMENT Right 04/17/2019   Procedure: CYSTOSCOPY WITH RETROGRADE PYELOGRAM, BALLOON [place]  URETEROSCOPY AND STENT PLACEMENT;  Surgeon: Cleon Gustin, MD;  Location: [facility];  Service: Urology;  Laterality: Right;   CYSTOSCOPY WITH STENT PLACEMENT Right 11/21/2018   Procedure: CYSTOSCOPY, RIGHT RETROGRADE PYELOGRAM, WITH RIGHT URETERAL STENT PLACEMENT;  Surgeon: Cleon Gustin, MD;  Location: [facility];  Service: Urology;  Laterality: Right;   IR IMAGING GUIDED PORT INSERTION  09/04/2020   IR NEPHROSTOMY PLACEMENT RIGHT  06/22/2019   IR PATIENT EVAL TECH 0-60 MINS  06/28/2019   IR RADIOLOGIST EVAL & MGMT  06/06/2020   IR RADIOLOGIST EVAL & MGMT  07/17/2020   IR RADIOLOGIST EVAL & MGMT  08/15/2020   IR RADIOLOGIST EVAL & MGMT  09/03/2020   IR RADIOLOGIST EVAL & MGMT  12/18/2020   IR  RADIOLOGIST EVAL & MGMT  02/25/2021   IR RADIOLOGIST EVAL & MGMT  06/23/2021   IR RADIOLOGIST EVAL & MGMT  08/12/2021   left forearm surgery due to calcium rock      RADIOLOGY WITH ANESTHESIA N/A 06/19/2020   Procedure: CT WITH ANESTHESIA MICROWAVE ABLATION;  Surgeon: Jacqulynn Cadet, MD;  Location: [facility];  Service: Anesthesiology;  Laterality: N/A;   RADIOLOGY WITH ANESTHESIA N/A 07/30/2021   Procedure: CT MICROWAVE ABLATION OF THE LIVER;  Surgeon: Criselda Peaches,  MD;  Location: [facility];  Service: Radiology;  Laterality: N/A;   ROBOT ASSISTED PYELOPLASTY Right 08/03/2019   Procedure: XI ROBOTIC ASSISTED PYELOPLASTY WITH STENT PLACEMENT;  Surgeon: Cleon Gustin, MD;  Location: [facility];  Service: Urology;  Laterality: Right;  3 HRS   SHOULDER ARTHROSCOPY W/ ROTATOR CUFF REPAIR Right    XI ROBOTIC ASSISTED LOWER ANTERIOR RESECTION N/A 09/16/2018   Procedure: XI ROBOTIC ASSISTED LOWER ANTERIOR RESECTION ERAS PATHWAY;  Surgeon: Leighton Ruff, MD;  Location: [facility];  Service: General;  Laterality: N/A;     OB History   No obstetric history on file.     Family History  Problem Relation Age of Onset   COPD Father        smoker   Stomach cancer Father        spread to lungs and bones   Arthritis Mother    Heart attack Paternal Grandmother    Heart attack Paternal Grandfather    Heart attack Maternal Grandmother    Stroke Maternal Grandmother    Colon cancer Neg Hx    Rectal cancer Neg Hx    Esophageal cancer Neg Hx     Social History   Tobacco Use   Smoking status: Former    Packs/day: 1.00    Years: 30.00    Pack years: 30.00    Types: Cigarettes    Quit date: 09/16/2018    Years since quitting: 2.9   Smokeless tobacco: Never  Vaping Use   Vaping Use: Never used  Substance Use Topics   Alcohol use: Yes    Comment: social   Drug use: Never    Home Medications Prior to Admission medications   Medication Sig Start Date End Date Taking? Authorizing Provider  alprazolam Duanne Moron) 2 MG tablet Take 2 mg by mouth every morning.     [provider]  Ascorbic Acid (VITAMIN C) 1000 MG tablet Take 1,000 mg by mouth daily.    [provider]  aspirin EC 81 MG tablet Take 81 mg by mouth every morning. Swallow whole.     [provider]  Biotin 10 MG CAPS Take 10 mg by mouth daily.    [provider]  buPROPion (WELLBUTRIN XL) 300 MG 24 hr tablet Take 300 mg by mouth every morning.     [provider]  calcium carbonate (OSCAL) 1500 (600 Ca) MG TABS tablet Take 600 mg of elemental calcium by mouth daily. Patient not taking: Reported on 08/22/2021    [provider]  Cholecalciferol (DIALYVITE VITAMIN D 5000) 125 MCG (5000 UT) capsule Take 5,000 Units by mouth daily.    [provider]  clotrimazole-betamethasone (LOTRISONE) cream Apply 1 application topically 2 (two) times daily. 08/15/21   McKenzie, Candee Furbish, MD  cyclobenzaprine (FLEXERIL) 10 MG tablet Take 20 mg by mouth at bedtime.    [provider]  diphenoxylate-atropine (LOMOTIL) 2.5-0.025 MG tablet TAKE 1 TO 2 TABLETS BY MOUTH FOUR TIMES DAILY  AS NEEDED FOR DIARRHEA OR LOOSE STOOLS Patient not taking: Reported on 07/31/2021 03/10/21   Owens Shark, NP  [facility] C PO Take 1 tablet by mouth daily.    [provider]  HYDROcodone-acetaminophen (NORCO) 10-325 MG tablet Take 1-2 tablets by mouth every 6 (six) hours as needed for moderate pain or severe pain (headache). Post-Operatively. 08/05/19   Alexis Frock, MD  lidocaine-prilocaine (EMLA) cream Apply 1 application topically as directed. Apply 1-2 hours prior to IV stick and cover with plastic wrap Patient taking differently: Apply 1 application topically daily as needed (port access). 11/28/20   Ladell Pier, MD  lubiprostone (AMITIZA) 24 MCG capsule Take 1 capsule (24 mcg total) by mouth 2 (two) times daily with a meal. 07/16/21 01/07/23  Cirigliano, Vito V, DO  METAMUCIL FIBER PO Take 1 capsule by mouth daily.    [provider]  Methylcellulose, Laxative, (CITRUCEL PO) Take 1 tablet by mouth daily.    [provider]  Multiple Vitamin (MULTIVITAMIN WITH MINERALS) TABS tablet Take 1 tablet by mouth daily.    [provider]  Na Sulfate-K Sulfate-Mg Sulf 17.5-3.13-1.6 GM/177ML SOLN Take 1 kit by mouth as directed. May use generic Suprep Patient not taking: Reported on 08/08/2021 07/31/21    Cirigliano, Dominic Pea, DO  nitrofurantoin, macrocrystal-monohydrate, (MACROBID) 100 MG capsule Take 1 capsule (100 mg total) by mouth 2 (two) times daily for 7 days. 08/22/21 08/29/21  Gareth Morgan, MD  Omega-3 Fatty Acids (FISH OIL) 1000 MG CAPS Take 1,000 mg by mouth daily.    [provider]  oxybutynin (DITROPAN) 5 MG tablet Take 1 tablet (5 mg total) by mouth 3 (three) times daily for 7 days. 08/22/21 08/29/21  Gareth Morgan, MD  oxyCODONE-acetaminophen (PERCOCET/ROXICET) 5-325 MG tablet Take 1-2 tablets by mouth every 6 (six) hours as needed for severe pain. 08/07/21   McKenzie, Candee Furbish, MD  Prenatal Vit-Fe Fumarate-FA (PRENATAL PO) Take 1 tablet by mouth daily.    [provider]  Probiotic Product (PROBIOTIC PO) Take 1 tablet by mouth every morning. Acidophilus 100 million    [provider]  rOPINIRole (REQUIP) 1 MG tablet Take 1 mg by mouth at bedtime.    [provider]  TURMERIC PO Take 1 tablet by mouth daily.    [provider]  vitamin B-12 (CYANOCOBALAMIN) 1000 MCG tablet Take 1,000 mcg by mouth daily.    [provider]    Allergies    Prednisone and Codeine  Review of Systems   Review of Systems  Constitutional:        Per HPI, otherwise negative  HENT:         Per HPI, otherwise negative  Respiratory:         Per HPI, otherwise negative  Cardiovascular:        Per HPI, otherwise negative  Gastrointestinal:  Negative for vomiting.  Endocrine:       Negative aside from HPI  Genitourinary:        Neg aside from HPI   Musculoskeletal:        Per HPI, otherwise negative  Skin: Negative.   Neurological:  Negative for syncope.   Physical Exam Updated Vital Signs BP (!) 82/56   Pulse 95   Temp 100 F (37.8 C) (Oral)   Resp 19   Ht 5' 8"  (1.727 m)   Wt 98 kg   LMP  (LMP Unknown)   SpO2 96%   BMI 32.84 kg/m  Physical Exam Vitals and nursing note reviewed.  Constitutional:      General: She is not  in acute distress.    Appearance: She is well-developed.  HENT:     Head: Normocephalic and atraumatic.  Eyes:     Conjunctiva/sclera: Conjunctivae normal.  Cardiovascular:     Rate and Rhythm: Normal rate and regular rhythm.  Pulmonary:     Effort: Pulmonary effort is normal. No respiratory distress.     Breath sounds: Normal breath sounds. No stridor.  Abdominal:     General: There is no distension.     Tenderness: There is no abdominal tenderness. There is no guarding.  Skin:    General: Skin is warm and dry.  Neurological:     Mental Status: She is alert and oriented to person, place, and time.     Cranial Nerves: No cranial nerve deficit.    ED Results / Procedures / Treatments   Labs (all labs ordered are listed, but only abnormal results are displayed) Labs Reviewed  COMPREHENSIVE METABOLIC PANEL - Abnormal; Notable for the following components:      Result Value   Potassium 3.3 (*)    Glucose, Bld 104 (*)    BUN 22 (*)    Creatinine, Ser 1.18 (*)    Calcium 8.7 (*)    Total Protein 8.2 (*)    GFR, Estimated 54 (*)    All other components within normal limits  URINALYSIS, ROUTINE W REFLEX MICROSCOPIC - Abnormal; Notable for the following components:   Hgb urine dipstick LARGE (*)    Protein, ur >300 (*)    Leukocytes,Ua MODERATE (*)    All other components within normal limits  URINALYSIS, MICROSCOPIC (REFLEX) - Abnormal; Notable for the following components:   Bacteria, UA FEW (*)    Non Squamous Epithelial PRESENT (*)    All other components within normal limits  RESP PANEL BY RT-PCR (FLU A&B, COVID) ARPGX2  CBC WITH DIFFERENTIAL/PLATELET    EKG None  Radiology CT Renal Stone Study  Result Date: 08/24/2021 CLINICAL DATA:  Fever, recent Foley catheter remote, dysuria, history of metastatic rectal cancer to liver status post liver ablation EXAM: CT ABDOMEN AND PELVIS WITHOUT CONTRAST TECHNIQUE: Multidetector CT imaging of the abdomen and pelvis was  performed following the standard protocol without IV contrast. Unenhanced CT was performed per clinician order. Lack of IV contrast limits sensitivity and specificity, especially for evaluation of abdominal/pelvic solid viscera. COMPARISON:  08/21/2021 FINDINGS: Lower chest: Subsegmental atelectasis within the lower lobes. No acute pleural or parenchymal lung disease. Hepatobiliary: 4.3 x 3.6 cm hypodensity within the inferior aspect right lobe liver again identified, consistent with the site of prior ablation. Previously this had measured 4.8 x 4.3 cm. Remainder of the liver is unremarkable. High attenuation material within the gallbladder consistent with vicarious excretion of previously administered contrast. Cholelithiasis is again noted without evidence of cholecystitis. Pancreas: Unremarkable. No pancreatic ductal dilatation or surrounding inflammatory changes. Spleen: Normal in size without focal abnormality. Adrenals/Urinary Tract: A right ureteral stent is again identified extending from the right renal pelvis into the bladder lumen. No change in the right Peri ureteral fat stranding seen previously. 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 The left kidney is unremarkable.  No urinary tract calculi. Bladder is decompressed, limiting its evaluation. The adrenals are unremarkable. Stomach/Bowel: No bowel obstruction or ileus. Normal appendix right lower quadrant. No bowel wall thickening or inflammatory change. Vascular/Lymphatic:  No significant vascular findings are present. No enlarged abdominal or pelvic lymph nodes. Reproductive: Uterus and bilateral adnexa are unremarkable. Other: No free fluid or free gas.  No abdominal wall hernia. Musculoskeletal: No acute or destructive bony lesions. There is grade 2 anterior spondylolisthesis of L5 on S1 unchanged. Reconstructed images demonstrate no additional findings. IMPRESSION: 1. Stable position of the right  ureteral stent. No change in the right Peri ureteral fat stranding seen previously. 2. Decreased size of the right lobe liver hypodensity at the site of prior microwave ablation. 3. Cholelithiasis without acute cholecystitis. Electronically Signed   By: Randa Ngo M.D.   On: 08/24/2021 21:51    Procedures Procedures   Medications Ordered in ED Medications  cefTRIAXone (ROCEPHIN) 1 g in sodium chloride 0.9 % 100 mL IVPB (1 g Intravenous New Bag/Given 08/24/21 2133)  sodium chloride 0.9 % bolus 1,000 mL (0 mLs Intravenous Stopped 08/24/21 2000)  lidocaine-prilocaine (EMLA) cream ( Topical Given 08/24/21 1703)  ketorolac (TORADOL) 30 MG/ML injection 15 mg (15 mg Intravenous Given 08/24/21 1815)  prochlorperazine (COMPAZINE) injection 5 mg (5 mg Intravenous Given 08/24/21 1815)  acetaminophen (TYLENOL) tablet 1,000 mg (1,000 mg Oral Given 08/24/21 1840)    ED Course  I have reviewed the triage vital signs and the nursing notes.  Pertinent labs & imaging results that were available during my care of the patient were reviewed by me and considered in my medical decision making (see chart for details).  Update:, Patient continues to complain of pain.  Update:, Patient and I had a lengthy conversation with all findings.  With fever, leukocytes in her urine, tachypnea, tachycardia, some suspicion for ongoing infection.  She is not initially hypotensive, but does have some evidence for hypotension during ED course.  With consideration of hypotension, fever, tachycardia, ongoing infection of the patient's urinary tract given recent instrumentation, patient will require admission for ongoing IV antibiotics, fluid resuscitation.  She meets sepsis criteria. MDM Rules/Calculators/A&P MDM Number of Diagnoses or Management Options Severe sepsis ([place]): new, needed workup   Amount and/or Complexity of Data Reviewed Clinical lab tests: ordered and reviewed Tests in the radiology section of CPT: ordered  and reviewed Tests in the medicine section of CPT: reviewed and ordered Decide to obtain previous medical records or to obtain history from someone other than the patient: yes Obtain history from someone other than the patient: yes Review and summarize past medical records: yes Discuss the patient with other providers: yes Independent visualization of images, tracings, or specimens: yes  Risk of Complications, Morbidity, and/or Mortality Presenting problems: high Diagnostic procedures: high Management options: high  Critical Care Total time providing critical care: 30-74 minutes (35)  Patient Progress Patient progress: stable   Final Clinical Impression(s) / ED Diagnoses Final diagnoses:  Severe sepsis [facility])     Carmin Muskrat, MD 08/24/21 2202

## 2021-08-25 ENCOUNTER — Other Ambulatory Visit: Payer: Self-pay

## 2021-08-25 DIAGNOSIS — Z1611 Resistance to penicillins: Secondary | ICD-10-CM | POA: Diagnosis present

## 2021-08-25 DIAGNOSIS — A498 Other bacterial infections of unspecified site: Secondary | ICD-10-CM | POA: Diagnosis not present

## 2021-08-25 DIAGNOSIS — E872 Acidosis, unspecified: Secondary | ICD-10-CM | POA: Diagnosis present

## 2021-08-25 DIAGNOSIS — N390 Urinary tract infection, site not specified: Secondary | ICD-10-CM

## 2021-08-25 DIAGNOSIS — M419 Scoliosis, unspecified: Secondary | ICD-10-CM | POA: Diagnosis present

## 2021-08-25 DIAGNOSIS — J9811 Atelectasis: Secondary | ICD-10-CM | POA: Diagnosis present

## 2021-08-25 DIAGNOSIS — F419 Anxiety disorder, unspecified: Secondary | ICD-10-CM | POA: Diagnosis present

## 2021-08-25 DIAGNOSIS — C20 Malignant neoplasm of rectum: Secondary | ICD-10-CM | POA: Diagnosis present

## 2021-08-25 DIAGNOSIS — Z1619 Resistance to other specified beta lactam antibiotics: Secondary | ICD-10-CM | POA: Diagnosis present

## 2021-08-25 DIAGNOSIS — E876 Hypokalemia: Secondary | ICD-10-CM | POA: Diagnosis present

## 2021-08-25 DIAGNOSIS — Y846 Urinary catheterization as the cause of abnormal reaction of the patient, or of later complication, without mention of misadventure at the time of the procedure: Secondary | ICD-10-CM | POA: Diagnosis present

## 2021-08-25 DIAGNOSIS — Z87442 Personal history of urinary calculi: Secondary | ICD-10-CM | POA: Diagnosis not present

## 2021-08-25 DIAGNOSIS — K5900 Constipation, unspecified: Secondary | ICD-10-CM | POA: Diagnosis not present

## 2021-08-25 DIAGNOSIS — E861 Hypovolemia: Secondary | ICD-10-CM | POA: Diagnosis present

## 2021-08-25 DIAGNOSIS — Z20822 Contact with and (suspected) exposure to covid-19: Secondary | ICD-10-CM | POA: Diagnosis present

## 2021-08-25 DIAGNOSIS — A4159 Other Gram-negative sepsis: Secondary | ICD-10-CM | POA: Diagnosis present

## 2021-08-25 DIAGNOSIS — N135 Crossing vessel and stricture of ureter without hydronephrosis: Secondary | ICD-10-CM | POA: Diagnosis present

## 2021-08-25 DIAGNOSIS — R6520 Severe sepsis without septic shock: Secondary | ICD-10-CM | POA: Diagnosis present

## 2021-08-25 DIAGNOSIS — C787 Secondary malignant neoplasm of liver and intrahepatic bile duct: Secondary | ICD-10-CM | POA: Diagnosis present

## 2021-08-25 DIAGNOSIS — G2581 Restless legs syndrome: Secondary | ICD-10-CM | POA: Diagnosis present

## 2021-08-25 DIAGNOSIS — F32A Depression, unspecified: Secondary | ICD-10-CM | POA: Diagnosis present

## 2021-08-25 DIAGNOSIS — A419 Sepsis, unspecified organism: Secondary | ICD-10-CM | POA: Diagnosis not present

## 2021-08-25 DIAGNOSIS — E871 Hypo-osmolality and hyponatremia: Secondary | ICD-10-CM | POA: Diagnosis present

## 2021-08-25 DIAGNOSIS — G43909 Migraine, unspecified, not intractable, without status migrainosus: Secondary | ICD-10-CM | POA: Diagnosis present

## 2021-08-25 DIAGNOSIS — T83511A Infection and inflammatory reaction due to indwelling urethral catheter, initial encounter: Secondary | ICD-10-CM | POA: Diagnosis present

## 2021-08-25 DIAGNOSIS — R509 Fever, unspecified: Secondary | ICD-10-CM | POA: Diagnosis present

## 2021-08-25 DIAGNOSIS — M1990 Unspecified osteoarthritis, unspecified site: Secondary | ICD-10-CM | POA: Diagnosis present

## 2021-08-25 LAB — COMPREHENSIVE METABOLIC PANEL
ALT: 22 U/L (ref 0–44)
AST: 24 U/L (ref 15–41)
Albumin: 3 g/dL — ABNORMAL LOW (ref 3.5–5.0)
Alkaline Phosphatase: 78 U/L (ref 38–126)
Anion gap: 7 (ref 5–15)
BUN: 28 mg/dL — ABNORMAL HIGH (ref 6–20)
CO2: 23 mmol/L (ref 22–32)
Calcium: 7.9 mg/dL — ABNORMAL LOW (ref 8.9–10.3)
Chloride: 104 mmol/L (ref 98–111)
Creatinine, Ser: 1.21 mg/dL — ABNORMAL HIGH (ref 0.44–1.00)
GFR, Estimated: 52 mL/min — ABNORMAL LOW (ref >60)
Glucose, Bld: 110 mg/dL — ABNORMAL HIGH (ref 70–99)
Potassium: 3 mmol/L — ABNORMAL LOW (ref 3.5–5.1)
Sodium: 134 mmol/L — ABNORMAL LOW (ref 135–145)
Total Bilirubin: 0.6 mg/dL (ref 0.3–1.2)
Total Protein: 6.7 g/dL (ref 6.5–8.1)

## 2021-08-25 LAB — CBC WITH DIFFERENTIAL/PLATELET
Abs Immature Granulocytes: 0.02 10*3/uL (ref 0.00–0.07)
Basophils Absolute: 0 10*3/uL (ref 0.0–0.1)
Basophils Relative: 0 %
Eosinophils Absolute: 0.2 10*3/uL (ref 0.0–0.5)
Eosinophils Relative: 5 %
HCT: 32 % — ABNORMAL LOW (ref 36.0–46.0)
Hemoglobin: 10.4 g/dL — ABNORMAL LOW (ref 12.0–15.0)
Immature Granulocytes: 0 %
Lymphocytes Relative: 16 %
Lymphs Abs: 0.7 10*3/uL (ref 0.7–4.0)
MCH: 30.7 pg (ref 26.0–34.0)
MCHC: 32.5 g/dL (ref 30.0–36.0)
MCV: 94.4 fL (ref 80.0–100.0)
Monocytes Absolute: 0.5 10*3/uL (ref 0.1–1.0)
Monocytes Relative: 10 %
Neutro Abs: 3.2 10*3/uL (ref 1.7–7.7)
Neutrophils Relative %: 69 %
Platelets: 157 10*3/uL (ref 150–400)
RBC: 3.39 MIL/uL — ABNORMAL LOW (ref 3.87–5.11)
RDW: 13.4 % (ref 11.5–15.5)
WBC: 4.7 10*3/uL (ref 4.0–10.5)
nRBC: 0 % (ref 0.0–0.2)

## 2021-08-25 LAB — HIV ANTIBODY (ROUTINE TESTING W REFLEX): HIV Screen 4th Generation wRfx: NONREACTIVE

## 2021-08-25 LAB — TYPE AND SCREEN
ABO/RH(D): A POS
Antibody Screen: NEGATIVE

## 2021-08-25 LAB — LACTIC ACID, PLASMA: Lactic Acid, Venous: 1.7 mmol/L (ref 0.5–1.9)

## 2021-08-25 LAB — PROCALCITONIN: Procalcitonin: 1.79 ng/mL

## 2021-08-25 MED ORDER — POTASSIUM CHLORIDE CRYS ER 20 MEQ PO TBCR
40.0000 meq | EXTENDED_RELEASE_TABLET | ORAL | Status: AC
  Administered 2021-08-25 (×2): 40 meq via ORAL
  Filled 2021-08-25 (×2): qty 2

## 2021-08-25 MED ORDER — ENOXAPARIN SODIUM 40 MG/0.4ML IJ SOSY
40.0000 mg | PREFILLED_SYRINGE | Freq: Every day | INTRAMUSCULAR | Status: DC
  Administered 2021-08-25 – 2021-08-29 (×4): 40 mg via SUBCUTANEOUS
  Filled 2021-08-25 (×5): qty 0.4

## 2021-08-25 NOTE — Progress Notes (Signed)
Delay in accepting patient d/t waiting to see if patient would allow tylenol to be given for temp of 100.3.

## 2021-08-25 NOTE — Progress Notes (Signed)
PROGRESS NOTE    Norma Colon  QVZ:[id number] DOB: September 04, 1961 DOA: 08/24/2021 PCP: Dian Queen, MD   Chief Complaint  Patient presents with   Fever   Brief Narrative:   Norma Colon is Norma Colon 60 y.o. female with history of metastatic rectal carcinoma who has had Norma Colon procedure for ablation of the liver lesion in October following which patient had Kahleah Crass ureteral injury stent placed and come to the ER 2 days ago after patient had hematuria and increasing pain during which patient's Foley catheter was removed and patient was placed on antibiotics.  Despite taking antibiotics patient was still having fever chills according temperatures up to 104 at home.  Patient presents to the ER.  Denies any chest pain or shortness of breath nausea or vomiting or diarrhea.   ED Course: In the ER patient blood pressure is in the low 32R systolic and UA is concerning for UTI CT renal study shows stable stent of the right ureter.  COVID test was negative.  Patient had blood cultures drawn started on empiric antibiotic.  Possible sepsis related to urinary tract infection.  Patient's urine cultures 2 days ago grew Enterobacter cloacae sensitive to cefepime which has been started.  Assessment & Plan:   Principal Problem:   Sepsis (Norma Colon) Active Problems:   Adenocarcinoma of rectum (Norma Colon)   Acute lower UTI   Infection due to Enterobacter cloacae   Complicated UTI (urinary tract infection)   Rectal cancer (Norma Colon)   Hypokalemia  * Sepsis (Norma Colon) Sepsis 2/2 enterobacter cloacae UTI Meets criteria with fever, tachycardiac, tachypnea and infection Hypotension initially, improved No cultures collected on admission, collect blood and urine cx CT with stable position of r ureteral stent, no change in R periureteral fat stranding Improving on cefepime Culture from 11/17 with enterobacter cloacae resistant to zosyn and ancef  Of note, had right distal ureteral injury when foley inflated in R ureter (now s/p  right ureteral stent placement).  Discussed case with Dr. Alyson Ingles who saw her on 11/11.  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 Benay Spice outpatient   Hypokalemia Replace and follow  DVT prophylaxis: lovenox Code Status: full Family Communication: noen at bedside Disposition:   Status is: Inpatient  Remains inpatient appropriate because: need for IV abx       Consultants:  urology  Procedures:  none  Antimicrobials:  Anti-infectives (From admission, onward)    Start     Dose/Rate Route Frequency Ordered Stop   08/24/21 2245  ceFEPIme (MAXIPIME) 2 g in sodium chloride 0.9 % 100 mL IVPB        2 g 200 mL/hr over 30 Minutes Intravenous Every 8 hours 08/24/21 2239     08/24/21 2130  cefTRIAXone (ROCEPHIN) 1 g in sodium chloride 0.9 % 100 mL IVPB        1 g 200 mL/hr over 30 Minutes Intravenous  Once 08/24/21 2122 08/24/21 2207          Subjective: C/o dysuria and abdominal discomfort  Objective: Vitals:   08/25/21 1515 08/25/21 1533 08/25/21 1600 08/25/21 1629  BP:   113/63 126/74  Pulse: 99  100 (!) 109  Resp: 20  12 20   Temp:  100.3 F (37.9 C)  99.6 F (37.6 C)  TempSrc:  Rectal  Oral  SpO2: 99%  100% 100%  Weight:      Height:        Intake/Output Summary (Last 24 hours) at 08/25/2021 1645 Last data filed at 08/25/2021 1507 Gross per 24 hour  Intake 3395.42 ml  Output --  Net 3395.42 ml   Filed Weights   08/24/21 1140  Weight: 98 kg    Examination:  General: No acute distress. Cardiovascular: RRR Lungs: unlabored Abdomen: R sided abdominal discomfort Neurological: Alert and oriented 3. Moves all extremities 4 with equal strength. Cranial nerves II through XII grossly intact. Skin: Warm and  dry. No rashes or lesions. Extremities: No clubbing or cyanosis. No edema.    Data Reviewed: I have personally reviewed following labs and imaging studies  CBC: Recent Labs  Lab 08/21/21 2114 08/24/21 1800 08/25/21 0426  WBC 8.1 8.0 4.7  NEUTROABS 5.5 6.7 3.2  HGB 12.8 12.1 10.4*  HCT 39.7 37.0 32.0*  MCV 94.7 93.9 94.4  PLT 266 196 883    Basic Metabolic Panel: Recent Labs  Lab 08/21/21 2118 08/24/21 1800 08/25/21 0426  NA 139 135 134*  K 3.8 3.3* 3.0*  CL 104 99 104  CO2 28 25 23   GLUCOSE 123* 104* 110*  BUN 24* 22* 28*  CREATININE 1.13* 1.18* 1.21*  CALCIUM 9.3 8.7* 7.9*    GFR: Estimated Creatinine Clearance: 62 mL/min (Norma Colon) (by C-G formula based on SCr of 1.21 mg/dL (H)).  Liver Function Tests: Recent Labs  Lab 08/21/21 2118 08/24/21 1800 08/25/21 0426  AST 19 29 24   ALT 22 24 22   ALKPHOS 99 95 78  BILITOT 0.7 1.1 0.6  PROT 7.5 8.2* 6.7  ALBUMIN 3.9 3.6 3.0*    CBG: No results for input(s): GLUCAP in the last 168 hours.   Recent Results (from the past 240 hour(s))  Urine Culture     Status: Abnormal   Collection Time: 08/21/21 11:04 PM   Specimen: Urine, Catheterized  Result Value Ref Range Status   Specimen Description   Final    URINE, CATHETERIZED Performed at [facility] [address] [place], [place] [address]    Special Requests   Final    NONE Performed at [facility], [place] [address], [place], [place] [address]    Culture >=100,000 COLONIES/mL ENTEROBACTER CLOACAE (Norma Colon)  Final   Report Status 08/24/2021 FINAL  Final   Organism ID, Bacteria ENTEROBACTER CLOACAE (Norma Colon)  Final      Susceptibility   Enterobacter cloacae - MIC*    CEFAZOLIN >=64 RESISTANT Resistant     CEFEPIME <=0.12 SENSITIVE Sensitive     CIPROFLOXACIN <=0.25 SENSITIVE Sensitive     GENTAMICIN <=1 SENSITIVE Sensitive     IMIPENEM 1 SENSITIVE Sensitive     NITROFURANTOIN 32 SENSITIVE Sensitive     TRIMETH/SULFA <=20 SENSITIVE  Sensitive     PIP/TAZO >=128 RESISTANT Resistant     * >=100,000 COLONIES/mL ENTEROBACTER CLOACAE  Resp Panel by RT-PCR (Flu Norma Colon, Covid) Nasopharyngeal Swab     Status: None   Collection Time: 08/24/21  4:30 PM   Specimen: Nasopharyngeal Swab; Nasopharyngeal(NP) swabs in vial transport medium  Result Value Ref Range Status   SARS Coronavirus 2 by RT PCR NEGATIVE NEGATIVE Final    Comment: (NOTE) SARS-CoV-2 target nucleic acids are NOT DETECTED.  The SARS-CoV-2 RNA is generally detectable in upper respiratory specimens during the acute phase of infection. The lowest concentration of SARS-CoV-2 viral copies this assay can detect is 138  copies/mL. Norma Colon negative result does not preclude SARS-Cov-2 infection and should not be used as the sole basis for treatment or other patient management decisions. Ambrea Hegler negative result may occur with  improper specimen collection/handling, submission of specimen other than nasopharyngeal swab, presence of viral mutation(s) within the areas targeted by this assay, and inadequate number of viral copies(<138 copies/mL). Daziya Redmond negative result must be combined with clinical observations, patient history, and epidemiological information. The expected result is Negative.  Fact Sheet for Patients:  EntrepreneurPulse.com.au  Fact Sheet for Healthcare Providers:  IncredibleEmployment.be  This test is no t yet approved or cleared by the [place] FDA and  has been authorized for detection and/or diagnosis of SARS-CoV-2 by FDA under an Emergency Use Authorization (EUA). This EUA will remain  in effect (meaning this test can be used) for the duration of the COVID-19 declaration under Section 564(b)(1) of the Act, 21 U.S.C.section 360bbb-3(b)(1), unless the authorization is terminated  or revoked sooner.       Influenza Norma Colon by PCR NEGATIVE NEGATIVE Final   Influenza B by PCR NEGATIVE NEGATIVE Final    Comment: (NOTE) The Xpert  Xpress SARS-CoV-2/FLU/RSV plus assay is intended as an aid in the diagnosis of influenza from Nasopharyngeal swab specimens and should not be used as Norma Colon sole basis for treatment. Nasal washings and aspirates are unacceptable for Xpert Xpress SARS-CoV-2/FLU/RSV testing.  Fact Sheet for Patients: EntrepreneurPulse.com.au  Fact Sheet for Healthcare Providers: IncredibleEmployment.be  This test is not yet approved or cleared by the [place] FDA and has been authorized for detection and/or diagnosis of SARS-CoV-2 by FDA under an Emergency Use Authorization (EUA). This EUA will remain in effect (meaning this test can be used) for the duration of the COVID-19 declaration under Section 564(b)(1) of the Act, 21 U.S.C. section 360bbb-3(b)(1), unless the authorization is terminated or revoked.  Performed at [facility], [place] [address] [place], [place] [address]          Radiology Studies: CT Renal Stone Study  Result Date: 08/24/2021 CLINICAL DATA:  Fever, recent Foley catheter remote, dysuria, history of metastatic rectal cancer to liver status post liver ablation EXAM: CT ABDOMEN AND PELVIS WITHOUT CONTRAST TECHNIQUE: Multidetector CT imaging of the abdomen and pelvis was performed following the standard protocol without IV contrast. Unenhanced CT was performed per clinician order. Lack of IV contrast limits sensitivity and specificity, especially for evaluation of abdominal/pelvic solid viscera. COMPARISON:  08/21/2021 FINDINGS: Lower chest: Subsegmental atelectasis within the lower lobes. No acute pleural or parenchymal lung disease. Hepatobiliary: 4.3 x 3.6 cm hypodensity within the inferior aspect right lobe liver again identified, consistent with the site of prior ablation. Previously this had measured 4.8 x 4.3 cm. Remainder of the liver is unremarkable. High attenuation material within the gallbladder consistent with vicarious  excretion of previously administered contrast. Cholelithiasis is again noted without evidence of cholecystitis. Pancreas: Unremarkable. No pancreatic ductal dilatation or surrounding inflammatory changes. Spleen: Normal in size without focal abnormality. Adrenals/Urinary Tract: Norma Colon right ureteral stent is again identified extending from the right renal pelvis into the bladder lumen. No change in the right Peri ureteral fat stranding seen previously. 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 The left kidney is unremarkable.  No urinary tract calculi. Bladder is decompressed, limiting its evaluation. The adrenals are unremarkable. Stomach/Bowel: No bowel obstruction or ileus. Normal appendix right lower quadrant. No bowel wall thickening or inflammatory change. Vascular/Lymphatic: No significant  vascular findings are present. No enlarged abdominal or pelvic lymph nodes. Reproductive: Uterus and bilateral adnexa are unremarkable. Other: No free fluid or free gas.  No abdominal wall hernia. Musculoskeletal: No acute or destructive bony lesions. There is grade 2 anterior spondylolisthesis of L5 on S1 unchanged. Reconstructed images demonstrate no additional findings. IMPRESSION: 1. Stable position of the right ureteral stent. No change in the right Peri ureteral fat stranding seen previously. 2. Decreased size of the right lobe liver hypodensity at the site of prior microwave ablation. 3. Cholelithiasis without acute cholecystitis. Electronically Signed   By: Norma Ngo M.D.   On: 08/24/2021 21:51        Scheduled Meds:  alprazolam  2 mg Oral q morning   buPROPion  300 mg Oral q morning   cyclobenzaprine  20 mg Oral QHS   oxybutynin  5 mg Oral TID   rOPINIRole  1 mg Oral QHS   Continuous Infusions:  ceFEPime (MAXIPIME) IV Stopped (08/25/21 1507)   lactated ringers Stopped (08/24/21 2355)     LOS: 0 days    Time spent: over 56 min    Norma Helper, MD [facility]   To contact the attending provider between 7A-7P or the covering provider during after hours 7P-7A, please log into the web site [email or web address] and access using universal [place] password for that web site. If you do not have the password, please call the hospital operator.  08/25/2021, 4:45 PM

## 2021-08-25 NOTE — Assessment & Plan Note (Signed)
As above.

## 2021-08-25 NOTE — Consult Note (Signed)
error 

## 2021-08-25 NOTE — Assessment & Plan Note (Signed)
Replace and follow. ?

## 2021-08-25 NOTE — Assessment & Plan Note (Addendum)
Sepsis 2/2 enterobacter cloacae UTI Meets criteria with fever, tachycardiac, tachypnea and infection Hypotension initially, improved No cultures collected on admission, collect blood and urine cx (pending) CT with stable position of r ureteral stent, no change in R periureteral fat stranding Improving on cefepime, continue for now Culture from 11/17 with enterobacter cloacae resistant to zosyn and ancef  Of note, had right distal ureteral injury when foley inflated in R ureter (now s/p right ureteral stent placement).  Discussed case with Dr. Alyson Ingles who saw her on 11/11 (note pending 11/21)

## 2021-08-25 NOTE — Assessment & Plan Note (Signed)
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 Benay Spice outpatient

## 2021-08-25 NOTE — Assessment & Plan Note (Addendum)
UTI abx as above

## 2021-08-26 ENCOUNTER — Inpatient Hospital Stay (HOSPITAL_COMMUNITY): Payer: 59

## 2021-08-26 DIAGNOSIS — A419 Sepsis, unspecified organism: Secondary | ICD-10-CM | POA: Diagnosis not present

## 2021-08-26 DIAGNOSIS — R059 Cough, unspecified: Secondary | ICD-10-CM

## 2021-08-26 LAB — COMPREHENSIVE METABOLIC PANEL
ALT: 29 U/L (ref 0–44)
AST: 36 U/L (ref 15–41)
Albumin: 3.1 g/dL — ABNORMAL LOW (ref 3.5–5.0)
Alkaline Phosphatase: 78 U/L (ref 38–126)
Anion gap: 10 (ref 5–15)
BUN: 17 mg/dL (ref 6–20)
CO2: 20 mmol/L — ABNORMAL LOW (ref 22–32)
Calcium: 8.5 mg/dL — ABNORMAL LOW (ref 8.9–10.3)
Chloride: 109 mmol/L (ref 98–111)
Creatinine, Ser: 0.99 mg/dL (ref 0.44–1.00)
GFR, Estimated: >60 mL/min (ref >60)
Glucose, Bld: 111 mg/dL — ABNORMAL HIGH (ref 70–99)
Potassium: 4.1 mmol/L (ref 3.5–5.1)
Sodium: 139 mmol/L (ref 135–145)
Total Bilirubin: 0.5 mg/dL (ref 0.3–1.2)
Total Protein: 7.3 g/dL (ref 6.5–8.1)

## 2021-08-26 LAB — CBC WITH DIFFERENTIAL/PLATELET
Abs Immature Granulocytes: 0.02 10*3/uL (ref 0.00–0.07)
Basophils Absolute: 0 10*3/uL (ref 0.0–0.1)
Basophils Relative: 1 %
Eosinophils Absolute: 0.2 10*3/uL (ref 0.0–0.5)
Eosinophils Relative: 4 %
HCT: 33.1 % — ABNORMAL LOW (ref 36.0–46.0)
Hemoglobin: 10.6 g/dL — ABNORMAL LOW (ref 12.0–15.0)
Immature Granulocytes: 1 %
Lymphocytes Relative: 20 %
Lymphs Abs: 0.9 10*3/uL (ref 0.7–4.0)
MCH: 30.3 pg (ref 26.0–34.0)
MCHC: 32 g/dL (ref 30.0–36.0)
MCV: 94.6 fL (ref 80.0–100.0)
Monocytes Absolute: 0.6 10*3/uL (ref 0.1–1.0)
Monocytes Relative: 15 %
Neutro Abs: 2.6 10*3/uL (ref 1.7–7.7)
Neutrophils Relative %: 59 %
Platelets: 184 10*3/uL (ref 150–400)
RBC: 3.5 MIL/uL — ABNORMAL LOW (ref 3.87–5.11)
RDW: 13.8 % (ref 11.5–15.5)
WBC: 4.3 10*3/uL (ref 4.0–10.5)
nRBC: 0 % (ref 0.0–0.2)

## 2021-08-26 LAB — URINE CULTURE: Culture: <10000 — AB

## 2021-08-26 MED ORDER — KETOROLAC TROMETHAMINE 15 MG/ML IJ SOLN
15.0000 mg | Freq: Four times a day (QID) | INTRAMUSCULAR | Status: DC
  Administered 2021-08-26 – 2021-08-29 (×12): 15 mg via INTRAVENOUS
  Filled 2021-08-26 (×12): qty 1

## 2021-08-26 MED ORDER — PHENOL 1.4 % MT LIQD
1.0000 | OROMUCOSAL | Status: DC | PRN
  Administered 2021-08-26: 1 via OROMUCOSAL
  Filled 2021-08-26: qty 177

## 2021-08-26 MED ORDER — MORPHINE SULFATE (PF) 2 MG/ML IV SOLN: 2.0000 mg | INTRAVENOUS | Status: DC | PRN

## 2021-08-26 NOTE — Progress Notes (Signed)
PROGRESS NOTE    [name]  OYD:[id number] DOB: [birth year] DOA: 08/24/2021 PCP: Dian Queen, MD   Chief Complaint  Patient presents with   Fever   Brief Narrative:   Norma Colon is Norma Colon 60 y.o. female with history of metastatic rectal carcinoma who has had Hannan Tetzlaff procedure for ablation of the liver lesion in October following which patient had Mahlia Fernando ureteral injury stent placed and come to the ER 2 days ago after patient had hematuria and increasing pain during which patient's Foley catheter was removed and patient was placed on antibiotics.  Despite taking antibiotics patient was still having fever chills according temperatures up to 104 at home.  Patient presents to the ER.  Denies any chest pain or shortness of breath nausea or vomiting or diarrhea.   She's been admitted for sepsis due to [name] UTI.   Assessment & Plan:   Principal Problem:   Sepsis ([facility] [facility]) Active Problems:   Adenocarcinoma of rectum ([place])   Acute lower UTI   Infection due to Enterobacter cloacae   Complicated UTI (urinary tract infection)   Cough   Rectal cancer (HCC)   Hypokalemia  * Sepsis (HCC) Sepsis 2/2 enterobacter cloacae UTI Meets criteria with fever, tachycardiac, tachypnea and infection Hypotension initially, improved No cultures collected on admission, collect blood and urine cx (pending) CT with stable position of r ureteral stent, no change in R periureteral fat stranding Improving on cefepime, continue for now Culture from 11/17 with enterobacter cloacae resistant to zosyn and ancef  Of note, had right distal ureteral injury when foley inflated in R ureter (now s/p right ureteral stent placement).  Discussed case with Dr. Alyson Ingles who saw her on 11/11 (note pending 11/21)  Infection due to Enterobacter cloacae UTI abx as above  Cough Follow CXR Also with sore throat, negative covid/flu on 67/20  Complicated UTI (urinary tract infection) As above  Rectal cancer  (HCC) Metastatic rectal cancer S/p percutaneous thermal ablation of liver lesions and [place] core bx of presumed recurrent liver met on 10/26 Follows with Dr. Benay Spice outpatient   Hypokalemia Replace and follow  DVT prophylaxis: lovenox Code Status: full Family Communication: noen at bedside Disposition:   Status is: Inpatient  Remains inpatient appropriate because: need for IV abx       Consultants:  urology  Procedures:  none  Antimicrobials:  Anti-infectives (From admission, onward)    Start     Dose/Rate Route Frequency Ordered Stop   08/24/21 2245  ceFEPIme (MAXIPIME) 2 g in sodium chloride 0.9 % 100 mL IVPB        2 g 200 mL/hr over 30 Minutes Intravenous Every 8 hours 08/24/21 2239     08/24/21 2130  cefTRIAXone (ROCEPHIN) 1 g in sodium chloride 0.9 % 100 mL IVPB        1 g 200 mL/hr over 30 Minutes Intravenous  Once 08/24/21 2122 08/24/21 2207          Subjective: Complaints of sore throat Cough Continued dysuria  Objective: Vitals:   08/26/21 0459 08/26/21 0625 08/26/21 1300 08/26/21 2056  BP: 103/88  112/81 118/79  Pulse: 94  89 81  Resp: 18 16 18 20   Temp: 99.1 F (37.3 C)  99 F (37.2 C) 98.1 F (36.7 C)  TempSrc: Oral  Oral Oral  SpO2: 95%  96% 98%  Weight:      Height:        Intake/Output Summary (Last 24 hours) at 08/26/2021 2101 Last data filed  at 08/26/2021 1400 Gross per 24 hour  Intake 1700 ml  Output 350 ml  Net 1350 ml   Filed Weights   08/24/21 1140  Weight: 98 kg    Examination:  General: No acute distress. Cardiovascular: RRR Lungs: unlabored Abdomen: R sided abdominal discomfort Neurological: Alert and oriented 3. Moves all extremities 4 with equal strength. Cranial nerves II through XII grossly intact. Skin: Warm and dry. No rashes or lesions. Extremities: No clubbing or cyanosis. No edema.   Data Reviewed: I have personally reviewed following labs and imaging studies  CBC: Recent Labs  Lab  08/21/21 2114 08/24/21 1800 08/25/21 0426 08/26/21 0905  WBC 8.1 8.0 4.7 4.3  NEUTROABS 5.5 6.7 3.2 2.6  HGB 12.8 12.1 10.4* 10.6*  HCT 39.7 37.0 32.0* 33.1*  MCV 94.7 93.9 94.4 94.6  PLT 266 196 157 970    Basic Metabolic Panel: Recent Labs  Lab 08/21/21 2118 08/24/21 1800 08/25/21 0426 08/26/21 0905  NA 139 135 134* 139  K 3.8 3.3* 3.0* 4.1  CL 104 99 104 109  CO2 28 25 23  20*  GLUCOSE 123* 104* 110* 111*  BUN 24* 22* 28* 17  CREATININE 1.13* 1.18* 1.21* 0.99  CALCIUM 9.3 8.7* 7.9* 8.5*    GFR: Estimated Creatinine Clearance: 75.8 mL/min (by C-G formula based on SCr of 0.99 mg/dL).  Liver Function Tests: Recent Labs  Lab 08/21/21 2118 08/24/21 1800 08/25/21 0426 08/26/21 0905  AST 19 29 24  36  ALT 22 24 22 29   ALKPHOS 99 95 78 78  BILITOT 0.7 1.1 0.6 0.5  PROT 7.5 8.2* 6.7 7.3  ALBUMIN 3.9 3.6 3.0* 3.1*    CBG: No results for input(s): GLUCAP in the last 168 hours.   Recent Results (from the past 240 hour(s))  Urine Culture     Status: Abnormal   Collection Time: 08/21/21 11:04 PM   Specimen: Urine, Catheterized  Result Value Ref Range Status   Specimen Description   Final    URINE, CATHETERIZED Performed at [facility] [address], [place], [place] [address]    Special Requests   Final    NONE Performed at [facility], [place] [address] [place],  [address]    Culture >=100,000 COLONIES/mL ENTEROBACTER CLOACAE (Rosemarie Galvis)  Final   Report Status 08/24/2021 FINAL  Final   Organism ID, Bacteria ENTEROBACTER CLOACAE (Abeera Flannery)  Final      Susceptibility   Enterobacter cloacae - MIC*    CEFAZOLIN >=64 RESISTANT Resistant     CEFEPIME <=0.12 SENSITIVE Sensitive     CIPROFLOXACIN <=0.25 SENSITIVE Sensitive     GENTAMICIN <=1 SENSITIVE Sensitive     IMIPENEM 1 SENSITIVE Sensitive     NITROFURANTOIN 32 SENSITIVE Sensitive     TRIMETH/SULFA <=20 SENSITIVE Sensitive     PIP/TAZO >=128 RESISTANT Resistant     *  >=100,000 COLONIES/mL ENTEROBACTER CLOACAE  Resp Panel by RT-PCR (Flu Freyja Govea&B, Covid) Nasopharyngeal Swab     Status: None   Collection Time: 08/24/21  4:30 PM   Specimen: Nasopharyngeal Swab; Nasopharyngeal(NP) swabs in vial transport medium  Result Value Ref Range Status   SARS Coronavirus 2 by RT PCR NEGATIVE NEGATIVE Final    Comment: (NOTE) SARS-CoV-2 target nucleic acids are NOT DETECTED.  The SARS-CoV-2 RNA is generally detectable in upper respiratory specimens during the acute phase of infection. The lowest concentration of SARS-CoV-2 viral copies this assay can detect is 138 copies/mL. Kaisyn Millea negative result does not preclude SARS-Cov-2 infection  and should not be used as the sole basis for treatment or other patient management decisions. Thorne Wirz negative result may occur with  improper specimen collection/handling, submission of specimen other than nasopharyngeal swab, presence of viral mutation(s) within the areas targeted by this assay, and inadequate number of viral copies(<138 copies/mL). Dayton Sherr negative result must be combined with clinical observations, patient history, and epidemiological information. The expected result is Negative.  Fact Sheet for Patients:  EntrepreneurPulse.com.au  Fact Sheet for Healthcare Providers:  IncredibleEmployment.be  This test is no t yet approved or cleared by the [place] FDA and  has been authorized for detection and/or diagnosis of SARS-CoV-2 by FDA under an Emergency Use Authorization (EUA). This EUA will remain  in effect (meaning this test can be used) for the duration of the COVID-19 declaration under Section 564(b)(1) of the Act, 21 U.S.C.section 360bbb-3(b)(1), unless the authorization is terminated  or revoked sooner.       Influenza Lalanya Rufener by PCR NEGATIVE NEGATIVE Final   Influenza B by PCR NEGATIVE NEGATIVE Final    Comment: (NOTE) The Xpert Xpress SARS-CoV-2/FLU/RSV plus assay is intended as an  aid in the diagnosis of influenza from Nasopharyngeal swab specimens and should not be used as Gillie Fleites sole basis for treatment. Nasal washings and aspirates are unacceptable for Xpert Xpress SARS-CoV-2/FLU/RSV testing.  Fact Sheet for Patients: EntrepreneurPulse.com.au  Fact Sheet for Healthcare Providers: IncredibleEmployment.be  This test is not yet approved or cleared by the [place] FDA and has been authorized for detection and/or diagnosis of SARS-CoV-2 by FDA under an Emergency Use Authorization (EUA). This EUA will remain in effect (meaning this test can be used) for the duration of the COVID-19 declaration under Section 564(b)(1) of the Act, 21 U.S.C. section 360bbb-3(b)(1), unless the authorization is terminated or revoked.  Performed at [facility] [place] [address] [place], [place] [address]   Culture, blood (routine x 2)     Status: None (Preliminary result)   Collection Time: 08/25/21  5:17 PM   Specimen: BLOOD RIGHT HAND  Result Value Ref Range Status   Specimen Description   Final    BLOOD RIGHT HAND Performed at [facility] [address], [place], [place] [address]    Special Requests   Final    BOTTLES DRAWN AEROBIC AND ANAEROBIC Blood Culture results may not be optimal due to an inadequate volume of blood received in culture bottles Performed at [facility] [address] [place], [place] [address]    Culture   Final    NO GROWTH < 12 HOURS Performed at [facility] [address], [place], [place] [address]    Report Status PENDING  Incomplete  Culture, blood (routine x 2)     Status: None (Preliminary result)   Collection Time: 08/25/21  5:17 PM   Specimen: BLOOD LEFT HAND  Result Value Ref Range Status   Specimen Description   Final    BLOOD LEFT HAND Performed at [facility] [address], [place], [place] [address]     Special Requests   Final    BOTTLES DRAWN AEROBIC AND ANAEROBIC Blood Culture adequate volume Performed at [facility] [address], [place], [place] [address]    Culture   Final    NO GROWTH < 12 HOURS Performed at [facility] [address], [place], [place] [address]    Report Status PENDING  Incomplete  Radiology Studies: CT Renal Stone Study  Result Date: 08/24/2021 CLINICAL DATA:  Fever, recent Foley catheter remote, dysuria, history of metastatic rectal cancer to liver status post liver ablation EXAM: CT ABDOMEN AND PELVIS WITHOUT CONTRAST TECHNIQUE: Multidetector CT imaging of the abdomen and pelvis was performed following the standard protocol without IV contrast. Unenhanced CT was performed per clinician order. Lack of IV contrast limits sensitivity and specificity, especially for evaluation of abdominal/pelvic solid viscera. COMPARISON:  08/21/2021 FINDINGS: Lower chest: Subsegmental atelectasis within the lower lobes. No acute pleural or parenchymal lung disease. Hepatobiliary: 4.3 x 3.6 cm hypodensity within the inferior aspect right lobe liver again identified, consistent with the site of prior ablation. Previously this had measured 4.8 x 4.3 cm. Remainder of the liver is unremarkable. High attenuation material within the gallbladder consistent with vicarious excretion of previously administered contrast. Cholelithiasis is again noted without evidence of cholecystitis. Pancreas: Unremarkable. No pancreatic ductal dilatation or surrounding inflammatory changes. Spleen: Normal in size without focal abnormality. Adrenals/Urinary Tract: Kalik Hoare right ureteral stent is again identified extending from the right renal pelvis into the bladder lumen. No change in the right Peri ureteral fat stranding seen previously. 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 The left kidney is unremarkable.  No urinary tract  calculi. Bladder is decompressed, limiting its evaluation. The adrenals are unremarkable. Stomach/Bowel: No bowel obstruction or ileus. Normal appendix right lower quadrant. No bowel wall thickening or inflammatory change. Vascular/Lymphatic: No significant vascular findings are present. No enlarged abdominal or pelvic lymph nodes. Reproductive: Uterus and bilateral adnexa are unremarkable. Other: No free fluid or free gas.  No abdominal wall hernia. Musculoskeletal: No acute or destructive bony lesions. There is grade 2 anterior spondylolisthesis of L5 on S1 unchanged. Reconstructed images demonstrate no additional findings. IMPRESSION: 1. Stable position of the right ureteral stent. No change in the right Peri ureteral fat stranding seen previously. 2. Decreased size of the right lobe liver hypodensity at the site of prior microwave ablation. 3. Cholelithiasis without acute cholecystitis. Electronically Signed   By: Randa Ngo M.D.   On: 08/24/2021 21:51        Scheduled Meds:  alprazolam  2 mg Oral q morning   buPROPion  300 mg Oral q morning   cyclobenzaprine  20 mg Oral QHS   enoxaparin (LOVENOX) injection  40 mg Subcutaneous QHS   ketorolac  15 mg Intravenous Q6H   oxybutynin  5 mg Oral TID   rOPINIRole  1 mg Oral QHS   Continuous Infusions:  ceFEPime (MAXIPIME) IV 2 g (08/26/21 1400)     LOS: 1 day    Time spent: over 30 min    Fayrene Helper, MD [facility]   To contact the attending provider between 7A-7P or the covering provider during after hours 7P-7A, please log into the web site [email or web address] and access using universal [place] password for that web site. If you do not have the password, please call the hospital operator.  08/26/2021, 9:01 PM

## 2021-08-26 NOTE — Telephone Encounter (Signed)
I am sorry to hear that Norma Colon misunderstood what we discussed earlier this month on the phone.  Based on her recent complicated hospital course, I think that a preoperative OV in January prior to scheduling a colonoscopy is very appropriate.  We can absolutely still get the colonoscopy done in January if all clinically appropriate.

## 2021-08-26 NOTE — Assessment & Plan Note (Signed)
Follow CXR Also with sore throat, negative covid/flu on 11/20

## 2021-08-27 DIAGNOSIS — A419 Sepsis, unspecified organism: Secondary | ICD-10-CM | POA: Diagnosis not present

## 2021-08-27 MED ORDER — MAGNESIUM SULFATE 2 GM/50ML IV SOLN
2.0000 g | Freq: Once | INTRAVENOUS | Status: AC
  Administered 2021-08-27: 2 g via INTRAVENOUS
  Filled 2021-08-27: qty 50

## 2021-08-27 NOTE — Evaluation (Signed)
Physical Therapy Evaluation-1x Patient Details Name: Norma Colon MRN: [id number] DOB: 1961-02-16 Today's Date: 08/27/2021  History of Present Illness  60 yo female admitted with UTI, sepsis. Hx of met rectal ca, scoliosis, RLS, OA, liver lesions, HPV, chemo induced neutropenia, anxiety  Clinical Impression  On eval, pt was Supv-Mod Ind with mobility. She walked ~175 feet around the unit. She reports she has been ambulating to/from bathroom unassisted. Pt reported moderate flank pain with activity. Do not anticipate any f/u PT needs after discharge. Will sign off. 1x eval.      Recommendations for follow up therapy are one component of a multi-disciplinary discharge planning process, led by the attending physician.  Recommendations may be updated based on patient status, additional functional criteria and insurance authorization.  Follow Up Recommendations No PT follow up    Assistance Recommended at Discharge    Functional Status Assessment Patient has had a recent decline in their functional status and demonstrates the ability to make significant improvements in function in a reasonable and predictable amount of time.  Equipment Recommendations  None recommended by PT    Recommendations for Other Services       Precautions / Restrictions Precautions Precautions: Fall Restrictions Weight Bearing Restrictions: No      Mobility  Bed Mobility Overal bed mobility: Modified Independent                  Transfers Overall transfer level: Modified independent                      Ambulation/Gait Ambulation/Gait assistance: Supervision Gait Distance (Feet): 175 Feet Assistive device: None Gait Pattern/deviations: Step-through pattern;Decreased stride length       General Gait Details: Supv for safety. Mild unsteadiness. Pt reported lightheadedness.  Stairs            Wheelchair Mobility    Modified Rankin (Stroke Patients Only)        Balance Overall balance assessment: Needs assistance         Standing balance support: Bilateral upper extremity supported Standing balance-Leahy Scale: Poor                               Pertinent Vitals/Pain Pain Assessment: 0-10 Pain Score: 8  Pain Location: L flank Pain Descriptors / Indicators: Discomfort;Sore Pain Intervention(s): Repositioned;Monitored during session    [place] expects to be discharged to:: Private residence Living Arrangements: Alone Available Help at Discharge: Friend(s) Type of Home: House Home Access: Stairs to enter   Technical brewer of Steps: 2   Home Layout: One level Home Equipment: None      Prior Function Prior Level of Function : Independent/Modified Independent                     Hand Dominance        Extremity/Trunk Assessment   Upper Extremity Assessment Upper Extremity Assessment: Overall WFL for tasks assessed    Lower Extremity Assessment Lower Extremity Assessment: Overall WFL for tasks assessed (bil neuropathy)    Cervical / Trunk Assessment Cervical / Trunk Assessment: Normal  Communication   Communication: No difficulties  Cognition Arousal/Alertness: Awake/alert Behavior During Therapy: WFL for tasks assessed/performed Overall Cognitive Status: Within Functional Limits for tasks assessed  General Comments      Exercises     Assessment/Plan    PT Assessment Patient does not need any further PT services  PT Problem List         PT Treatment Interventions      PT Goals (Current goals can be found in the Care Plan section)  Acute Rehab PT Goals Patient Stated Goal: home soon PT Goal Formulation: All assessment and education complete, DC therapy Time For Goal Achievement: 09/10/21 Potential to Achieve Goals: Good    Frequency     Barriers to discharge        Co-evaluation                AM-PAC PT "6 Clicks" Mobility  Outcome Measure Help needed turning from your back to your side while in a flat bed without using bedrails?: None Help needed moving from lying on your back to sitting on the side of a flat bed without using bedrails?: None Help needed moving to and from a bed to a chair (including a wheelchair)?: None Help needed standing up from a chair using your arms (e.g., wheelchair or bedside chair)?: None Help needed to walk in hospital room?: A Little Help needed climbing 3-5 steps with a railing? : A Little 6 Click Score: 22    End of Session Equipment Utilized During Treatment: Gait belt Activity Tolerance: Patient tolerated treatment well Patient left: in bed;with call bell/phone within reach;with family/visitor present   PT Visit Diagnosis: Unsteadiness on feet (R26.81)    Time: 1607-3710 PT Time Calculation (min) (ACUTE ONLY): 15 min   Charges:   PT Evaluation $PT Eval Low Complexity: [place], PT Acute Rehabilitation  Office: [phone] Pager: [phone]

## 2021-08-27 NOTE — Plan of Care (Signed)

## 2021-08-27 NOTE — Telephone Encounter (Signed)
Dr. Loletha Grayer, please see note below. Patient requests to speak with you directly. Thank you.

## 2021-08-27 NOTE — Progress Notes (Signed)
PROGRESS NOTE  Norma Colon XTG:[id number] DOB: 02-08-1961 DOA: 08/24/2021 PCP: Dian Queen, MD  HPI/Recap of past 24 hours:  Norma Colon is a 60 y.o. female with history of metastatic rectal carcinoma who has had a procedure for ablation of the liver lesion in October following which patient had a ureteral injury stent placed and come to the ER 2 days ago after patient had hematuria and increasing pain during which patient's Foley catheter was removed and patient was placed on antibiotics.  Despite taking antibiotics patient was still having fever chills according temperatures up to 104 at home.  Patient presents to the ER.  Denies any chest pain or shortness of breath nausea or vomiting or diarrhea.   She's been admitted for sepsis due to a UTI.   08/27/2021: Patient was seen and examined at bedside.  Reports right flank pain.  As needed analgesics in place.  Assessment/Plan: Principal Problem:   Sepsis ([place]) Active Problems:   Adenocarcinoma of rectum (HCC)   Rectal cancer ([place])   Acute lower UTI   Complicated UTI (urinary tract infection)   Infection due to Enterobacter cloacae   Hypokalemia   Cough  Sepsis ([facility]) Sepsis 2/2 enterobacter cloacae UTI Meets criteria with fever, tachycardiac, tachypnea and infection Hypotension initially, improved No cultures collected on admission, collect blood and urine cx (pending) CT with stable position of r ureteral stent, no change in R periureteral fat stranding Improving on cefepime, continue for now Culture from 11/17 with enterobacter cloacae resistant to zosyn and ancef   Of note, had right distal ureteral injury when foley inflated in R ureter (now s/p right ureteral stent placement).  Discussed case with Dr. Alyson Ingles who saw her on 11/11 (note pending 11/21)   Enterobacter cloacae complicated UTI Continue antibiotics as stated above.  Non anion gap Metabolic acidosis Serum bicarb 20 anion gap 10 Repeat  labs in the morning  Hypomagnesemia Serum magnesium 1.6 Repleted Repeat magnesium level in the morning   Cough Chest x-ray done on 08/26/2021 showed bibasilar atelectasis, personally reviewed. 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 Benay Spice outpatient    Resolved post repletion hypokalemia Serum potassium 4.1 from 3.0.  Resolved hypovolemic hyponatremia Serum sodium 139 from 134.   DVT prophylaxis: lovenox subcu daily Code Status: full Family Communication: noen at bedside Disposition:    Status is: Inpatient   Remains inpatient appropriate because: need for IV abx           Consultants:  urology   Procedures:  none   Antimicrobials:  Anti-infectives (From admission, onward)        Start     Dose/Rate Route Frequency Ordered Stop    08/24/21 2245   ceFEPIme (MAXIPIME) 2 g in sodium chloride 0.9 % 100 mL IVPB        2 g 200 mL/hr over 30 Minutes Intravenous Every 8 hours 08/24/21 2239      08/24/21 2130   cefTRIAXone (ROCEPHIN) 1 g in sodium chloride 0.9 % 100 mL IVPB        1 g 200 mL/hr over 30 Minutes Intravenous  Once 08/24/21 2122 08/24/21 2207                Objective: Vitals:   08/26/21 1300 08/26/21 2056 08/27/21 0435 08/27/21 1300  BP: 112/81 118/79  119/74 120/75  Pulse: 89 81 67 73  Resp: 18 20 20 20   Temp: 99 F (37.2 C) 98.1 F (36.7 C) (!) 97.5 F (36.4 C) 97.9 F (36.6 C)  TempSrc: Oral Oral Oral Oral  SpO2: 96% 98% 97% 98%  Weight:      Height:       No intake or output data in the 24 hours ending 08/27/21 1641 Filed Weights   08/24/21 1140  Weight: 98 kg    Exam:  General: 60 y.o. year-old female well developed well nourished in no acute distress.  Alert and oriented x3. Cardiovascular: Regular rate and rhythm with no rubs or gallops.  No  thyromegaly or JVD noted.   Respiratory: Clear to auscultation with no wheezes or rales. Good inspiratory effort. Abdomen: Soft nondistended with normal bowel sounds x4 quadrants.  Right flank pain present. Musculoskeletal: No lower extremity edema. 2/4 pulses in all 4 extremities. Skin: No ulcerative lesions noted or rashes, Psychiatry: Mood is appropriate for condition and setting   Data Reviewed: CBC: Recent Labs  Lab 08/21/21 2114 08/24/21 1800 08/25/21 0426 08/26/21 0905  WBC 8.1 8.0 4.7 4.3  NEUTROABS 5.5 6.7 3.2 2.6  HGB 12.8 12.1 10.4* 10.6*  HCT 39.7 37.0 32.0* 33.1*  MCV 94.7 93.9 94.4 94.6  PLT 266 196 157 315   Basic Metabolic Panel: Recent Labs  Lab 08/21/21 2118 08/24/21 1800 08/25/21 0426 08/26/21 0905  NA 139 135 134* 139  K 3.8 3.3* 3.0* 4.1  CL 104 99 104 109  CO2 28 25 23  20*  GLUCOSE 123* 104* 110* 111*  BUN 24* 22* 28* 17  CREATININE 1.13* 1.18* 1.21* 0.99  CALCIUM 9.3 8.7* 7.9* 8.5*   GFR: Estimated Creatinine Clearance: 75.8 mL/min (by C-G formula based on SCr of 0.99 mg/dL). Liver Function Tests: Recent Labs  Lab 08/21/21 2118 08/24/21 1800 08/25/21 0426 08/26/21 0905  AST 19 29 24  36  ALT 22 24 22 29   ALKPHOS 99 95 78 78  BILITOT 0.7 1.1 0.6 0.5  PROT 7.5 8.2* 6.7 7.3  ALBUMIN 3.9 3.6 3.0* 3.1*   No results for input(s): LIPASE, AMYLASE in the last 168 hours. No results for input(s): AMMONIA in the last 168 hours. Coagulation Profile: No results for input(s): INR, PROTIME in the last 168 hours. Cardiac Enzymes: No results for input(s): CKTOTAL, CKMB, CKMBINDEX, TROPONINI in the last 168 hours. BNP (last 3 results) No results for input(s): PROBNP in the last 8760 hours. HbA1C: No results for input(s): HGBA1C in the last 72 hours. CBG: No results for input(s): GLUCAP in the last 168 hours. Lipid Profile: No results for input(s): CHOL, HDL, LDLCALC, TRIG, CHOLHDL, LDLDIRECT in the last 72 hours. Thyroid Function Tests: No  results for input(s): TSH, T4TOTAL, FREET4, T3FREE, THYROIDAB in the last 72 hours. Anemia Panel: No results for input(s): VITAMINB12, FOLATE, FERRITIN, TIBC, IRON, RETICCTPCT in the last 72 hours. Urine analysis:    Component Value Date/Time   COLORURINE YELLOW 08/24/2021 2017   APPEARANCEUR CLEAR 08/24/2021 2017   LABSPEC 1.025 08/24/2021 2017   PHURINE 6.0 08/24/2021 2017   GLUCOSEU NEGATIVE 08/24/2021 2017   HGBUR LARGE (A) 08/24/2021 2017   BILIRUBINUR NEGATIVE 08/24/2021 2017   KETONESUR NEGATIVE 08/24/2021 2017   PROTEINUR >300 (A) 08/24/2021 2017   NITRITE NEGATIVE 08/24/2021 2017   LEUKOCYTESUR MODERATE (A) 08/24/2021 2017   Sepsis Labs: @LABRCNTIP (procalcitonin:4,lacticidven:4)  ) Recent Results (from the past 240 hour(s))  Urine Culture     Status: Abnormal  Collection Time: 08/21/21 11:04 PM   Specimen: Urine, Catheterized  Result Value Ref Range Status   Specimen Description   Final    URINE, CATHETERIZED Performed at [facility] [address], [place], [place] [address]    Special Requests   Final    NONE Performed at [facility], [place] [address], [place], [place] [address]    Culture >=100,000 COLONIES/mL ENTEROBACTER CLOACAE (A)  Final   Report Status 08/24/2021 FINAL  Final   Organism ID, Bacteria ENTEROBACTER CLOACAE (A)  Final      Susceptibility   Enterobacter cloacae - MIC*    CEFAZOLIN >=64 RESISTANT Resistant     CEFEPIME <=0.12 SENSITIVE Sensitive     CIPROFLOXACIN <=0.25 SENSITIVE Sensitive     GENTAMICIN <=1 SENSITIVE Sensitive     IMIPENEM 1 SENSITIVE Sensitive     NITROFURANTOIN 32 SENSITIVE Sensitive     TRIMETH/SULFA <=20 SENSITIVE Sensitive     PIP/TAZO >=128 RESISTANT Resistant     * >=100,000 COLONIES/mL ENTEROBACTER CLOACAE  Resp Panel by RT-PCR (Flu A&B, Covid) Nasopharyngeal Swab     Status: None   Collection Time: 08/24/21  4:30 PM   Specimen: Nasopharyngeal Swab; Nasopharyngeal(NP) swabs  in vial transport medium  Result Value Ref Range Status   SARS Coronavirus 2 by RT PCR NEGATIVE NEGATIVE Final    Comment: (NOTE) SARS-CoV-2 target nucleic acids are NOT DETECTED.  The SARS-CoV-2 RNA is generally detectable in upper respiratory specimens during the acute phase of infection. The lowest concentration of SARS-CoV-2 viral copies this assay can detect is 138 copies/mL. A negative result does not preclude SARS-Cov-2 infection and should not be used as the sole basis for treatment or other patient management decisions. A negative result may occur with  improper specimen collection/handling, submission of specimen other than nasopharyngeal swab, presence of viral mutation(s) within the areas targeted by this assay, and inadequate number of viral copies(<138 copies/mL). A negative result must be combined with clinical observations, patient history, and epidemiological information. The expected result is Negative.  Fact Sheet for Patients:  EntrepreneurPulse.com.au  Fact Sheet for Healthcare Providers:  IncredibleEmployment.be  This test is no t yet approved or cleared by the [place] FDA and  has been authorized for detection and/or diagnosis of SARS-CoV-2 by FDA under an Emergency Use Authorization (EUA). This EUA will remain  in effect (meaning this test can be used) for the duration of the COVID-19 declaration under Section 564(b)(1) of the Act, 21 U.S.C.section 360bbb-3(b)(1), unless the authorization is terminated  or revoked sooner.       Influenza A by PCR NEGATIVE NEGATIVE Final   Influenza B by PCR NEGATIVE NEGATIVE Final    Comment: (NOTE) The Xpert Xpress SARS-CoV-2/FLU/RSV plus assay is intended as an aid in the diagnosis of influenza from Nasopharyngeal swab specimens and should not be used as a sole basis for treatment. Nasal washings and aspirates are unacceptable for Xpert Xpress  SARS-CoV-2/FLU/RSV testing.  Fact Sheet for Patients: EntrepreneurPulse.com.au  Fact Sheet for Healthcare Providers: IncredibleEmployment.be  This test is not yet approved or cleared by the [place] FDA and has been authorized for detection and/or diagnosis of SARS-CoV-2 by FDA under an Emergency Use Authorization (EUA). This EUA will remain in effect (meaning this test can be used) for the duration of the COVID-19 declaration under Section 564(b)(1) of the Act, 21 U.S.C. section 360bbb-3(b)(1), unless the authorization is terminated or revoked.  Performed at [facility], [place]  [address] [place], [place] [address]   Urine Culture     Status: Abnormal   Collection Time: 08/25/21  4:45 PM   Specimen: Urine, Clean Catch  Result Value Ref Range Status   Specimen Description   Final    URINE, CLEAN CATCH Performed at [facility], [place] [address], [place], [place] [address]    Special Requests   Final    NONE Performed at [facility], [place] [address] [place], [place] [address]    Culture (A)  Final    <10,000 COLONIES/mL INSIGNIFICANT GROWTH Performed at [facility] [address] [place], [place] [address]    Report Status 08/26/2021 FINAL  Final  Culture, blood (routine x 2)     Status: None (Preliminary result)   Collection Time: 08/25/21  5:17 PM   Specimen: BLOOD RIGHT HAND  Result Value Ref Range Status   Specimen Description   Final    BLOOD RIGHT HAND Performed at [facility] [address], [place], [place] [address]    Special Requests   Final    BOTTLES DRAWN AEROBIC AND ANAEROBIC Blood Culture results may not be optimal due to an inadequate volume of blood received in culture bottles Performed at [facility] [address] [place], [place] [address]    Culture   Final    NO GROWTH 2 DAYS Performed at [facility] [address] [place], [place] [address]    Report Status PENDING  Incomplete  Culture, blood (routine x 2)     Status: None (Preliminary result)   Collection Time: 08/25/21  5:17 PM   Specimen: BLOOD LEFT HAND  Result Value Ref Range Status   Specimen Description   Final    BLOOD LEFT HAND Performed at [facility] [address], [place], [place] [address]    Special Requests   Final    BOTTLES DRAWN AEROBIC AND ANAEROBIC Blood Culture adequate volume Performed at [facility] [address], [place], [place] [address]    Culture   Final    NO GROWTH 2 DAYS Performed at [facility] [address], [place], [place] [address]    Report Status PENDING  Incomplete      Studies: DG CHEST PORT 1 VIEW  Result Date: 08/26/2021 CLINICAL DATA:  Cough, chest pain. EXAM: PORTABLE CHEST 1 VIEW COMPARISON:  08/21/2021. FINDINGS: The heart is normal in size and the mediastinal structures are stable. The distal tip of a right chest port terminates over the superior vena cava. Mild atelectasis is present at the lung bases. No effusion or pneumothorax is seen. No acute osseous abnormality. IMPRESSION: Mild atelectasis at the lung bases. Electronically Signed   By: Brett Fairy M.D.   On: 08/26/2021 21:25    Scheduled Meds:  alprazolam  2 mg Oral q morning   buPROPion  300 mg Oral q morning   cyclobenzaprine  20 mg Oral QHS   enoxaparin (LOVENOX) injection  40 mg Subcutaneous QHS   ketorolac  15 mg Intravenous Q6H   oxybutynin  5 mg Oral TID   rOPINIRole  1 mg Oral QHS    Continuous Infusions:  ceFEPime (MAXIPIME) IV 2 g (08/27/21 1633)     LOS: 2 days     Kayleen Memos, MD [facility] Pager [phone]  If 7PM-7AM, please contact night-coverage [email or web address] Password [facility] 08/27/2021, 4:41 PM

## 2021-08-28 DIAGNOSIS — A419 Sepsis, unspecified organism: Secondary | ICD-10-CM | POA: Diagnosis not present

## 2021-08-28 LAB — CBC
HCT: 32.9 % — ABNORMAL LOW (ref 36.0–46.0)
Hemoglobin: 10.8 g/dL — ABNORMAL LOW (ref 12.0–15.0)
MCH: 30.8 pg (ref 26.0–34.0)
MCHC: 32.8 g/dL (ref 30.0–36.0)
MCV: 93.7 fL (ref 80.0–100.0)
Platelets: 203 10*3/uL (ref 150–400)
RBC: 3.51 MIL/uL — ABNORMAL LOW (ref 3.87–5.11)
RDW: 13.7 % (ref 11.5–15.5)
WBC: 4.4 10*3/uL (ref 4.0–10.5)
nRBC: 0 % (ref 0.0–0.2)

## 2021-08-28 LAB — BASIC METABOLIC PANEL
Anion gap: 10 (ref 5–15)
BUN: 18 mg/dL (ref 6–20)
CO2: 21 mmol/L — ABNORMAL LOW (ref 22–32)
Calcium: 8.6 mg/dL — ABNORMAL LOW (ref 8.9–10.3)
Chloride: 105 mmol/L (ref 98–111)
Creatinine, Ser: 1 mg/dL (ref 0.44–1.00)
GFR, Estimated: >60 mL/min (ref >60)
Glucose, Bld: 85 mg/dL (ref 70–99)
Potassium: 4.2 mmol/L (ref 3.5–5.1)
Sodium: 136 mmol/L (ref 135–145)

## 2021-08-28 LAB — MAGNESIUM: Magnesium: 2.3 mg/dL (ref 1.7–2.4)

## 2021-08-28 LAB — PHOSPHORUS: Phosphorus: 4.1 mg/dL (ref 2.5–4.6)

## 2021-08-28 MED ORDER — SENNOSIDES-DOCUSATE SODIUM 8.6-50 MG PO TABS
2.0000 | ORAL_TABLET | Freq: Two times a day (BID) | ORAL | Status: DC
  Administered 2021-08-28 – 2021-08-29 (×4): 2 via ORAL
  Filled 2021-08-28 (×7): qty 2

## 2021-08-28 MED ORDER — POLYETHYLENE GLYCOL 3350 17 G PO PACK: 17.0000 g | PACK | Freq: Every day | ORAL | Status: DC | PRN

## 2021-08-28 MED ORDER — MORPHINE SULFATE (PF) 2 MG/ML IV SOLN: 2.0000 mg | INTRAVENOUS | Status: DC | PRN

## 2021-08-28 MED ORDER — MAGNESIUM HYDROXIDE 400 MG/5ML PO SUSP
15.0000 mL | Freq: Once | ORAL | Status: AC
  Administered 2021-08-28: 15 mL via ORAL
  Filled 2021-08-28: qty 30

## 2021-08-28 MED ORDER — BISACODYL 10 MG RE SUPP: 10.0000 mg | Freq: Every day | RECTAL | Status: DC | PRN

## 2021-08-28 NOTE — Progress Notes (Signed)
PROGRESS NOTE  Norma Colon NGE:[id number] DOB: [birth year] DOA: 08/24/2021 PCP: Dian Queen, MD  HPI/Recap of past 24 hours: Norma Colon is a 60 y.o. female with history of metastatic rectal carcinoma who has had a procedure for ablation of the liver lesion in October following which patient had a ureteral injury 07/31/21 while attempting to place a foley catheter and she underwent urgent right ureteral stent placement.  She was scheduled for right diagnostic ureteroscopy in 2 weeks to ensure closure of the urethral injury.  2 days prior to presentation to the ED she developed worsening urinary urgency, frequency and dysuria.  She also developed fevers.  She has had intermittent right flank pain with a right ureteral stent in place.  CT scan shows a right ureteral stent in good position with no urine leak.    She was admitted for sepsis due to a complicated UTI.   40/07/2724: Patient was seen at her bedside.  She is tender with palpation of her right flank.  Pain is 5 out of 10.  Assessment/Plan: Principal Problem:   Sepsis ([place]) Active Problems:   Adenocarcinoma of rectum (HCC)   Rectal cancer ([place])   Acute lower UTI   Complicated UTI (urinary tract infection)   Infection due to Enterobacter cloacae   Hypokalemia   Cough  Sepsis ([place]) Sepsis 2/2 enterobacter cloacae UTI Meets criteria with fever, tachycardiac, tachypnea and infection Hypotension initially, improved No cultures collected on admission, collect blood and urine cx. Urine culture insignificant growth. Blood cultures negative to date. Urine culture collected on 08/21/2021 grew greater than 100,000 colonies of Enterobacter cloacae resistant to cefazolin Ancef. CT with stable position of r ureteral stent, no change in R periureteral fat stranding Improving on cefepime, continue for now  Of note, had right distal ureteral injury when foley inflated in R ureter (now s/p right ureteral stent  placement).  Previous provider discussed case with Dr. Alyson Ingles who saw her on 08/15/21 (note pending 11/21) Urology consulted, Dr. Alyson Ingles.   Enterobacter cloacae complicated UTI Continue antibiotics as stated above.  Non anion gap Metabolic acidosis Improving, serum bicarb uptrending. Repeat labs in the morning  Resolved post repletion.  Hypomagnesemia Serum magnesium 1.6>> 2.3.   Cough Chest x-ray done on 08/26/2021 showed bibasilar atelectasis, personally reviewed. Also with sore throat, negative covid/flu on 11/20 Symptomatic management as needed.   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 Benay Spice outpatient  Pain control   Resolved post repletion hypokalemia Serum potassium 4.1 from 3.0.  Resolved hypovolemic hyponatremia Serum sodium 136 from 134.  History of headaches Symptomatic management as needed  Constipation Senokot 2 tablets twice daily MiraLAX as needed   DVT prophylaxis: lovenox subcu daily Code Status: full Family Communication: None at bedside. Disposition:    Status is: Inpatient   Remains inpatient appropriate because: need for IV abx           Consultants:  urology   Procedures:  none   Antimicrobials:  Anti-infectives (From admission, onward)        Start     Dose/Rate Route Frequency Ordered Stop    08/24/21 2245   ceFEPIme (MAXIPIME) 2 g in sodium chloride 0.9 % 100 mL IVPB        2 g 200 mL/hr over 30 Minutes Intravenous Every 8 hours 08/24/21 2239      08/24/21 2130  cefTRIAXone (ROCEPHIN) 1 g in sodium chloride 0.9 % 100 mL IVPB        1 g 200 mL/hr over 30 Minutes Intravenous  Once 08/24/21 2122 08/24/21 2207                Objective: Vitals:   08/27/21 1300 08/27/21 2122 08/28/21 0532 08/28/21 1332  BP: 120/75 123/80 109/66 131/87  Pulse: 73 79 82 77  Resp: 20 20 18 20   Temp:  97.9 F (36.6 C) 98.5 F (36.9 C) 98.2 F (36.8 C) 97.9 F (36.6 C)  TempSrc: Oral Oral Oral Oral  SpO2: 98% 99% 93% 99%  Weight:      Height:        Intake/Output Summary (Last 24 hours) at 08/28/2021 1512 Last data filed at 08/28/2021 1300 Gross per 24 hour  Intake 1120.78 ml  Output --  Net 1120.78 ml   Filed Weights   08/24/21 1140  Weight: 98 kg    Exam:  General: 60 y.o. year-old female well-developed well-nourished in no acute distress.  She is alert and oriented x3.  Cardiovascular: Regular rate and rhythm no rubs or gallops. Respiratory: Clear to auscultation no wheezes or rales.  Abdomen: Soft right flank tenderness with palpation.  Bowel sounds present.   Musculoskeletal: No lower extremity edema bilaterally.   Skin: No ulcerative lesions noted. Psychiatry: Mood is appropriate for condition and setting.   Data Reviewed: CBC: Recent Labs  Lab 08/21/21 2114 08/24/21 1800 08/25/21 0426 08/26/21 0905 08/28/21 0722  WBC 8.1 8.0 4.7 4.3 4.4  NEUTROABS 5.5 6.7 3.2 2.6  --   HGB 12.8 12.1 10.4* 10.6* 10.8*  HCT 39.7 37.0 32.0* 33.1* 32.9*  MCV 94.7 93.9 94.4 94.6 93.7  PLT 266 196 157 184 832   Basic Metabolic Panel: Recent Labs  Lab 08/21/21 2118 08/24/21 1800 08/25/21 0426 08/26/21 0905 08/28/21 0722  NA 139 135 134* 139 136  K 3.8 3.3* 3.0* 4.1 4.2  CL 104 99 104 109 105  CO2 28 25 23  20* 21*  GLUCOSE 123* 104* 110* 111* 85  BUN 24* 22* 28* 17 18  CREATININE 1.13* 1.18* 1.21* 0.99 1.00  CALCIUM 9.3 8.7* 7.9* 8.5* 8.6*  MG  --   --   --   --  2.3  PHOS  --   --   --   --  4.1   GFR: Estimated Creatinine Clearance: 75 mL/min (by C-G formula based on SCr of 1 mg/dL). Liver Function Tests: Recent Labs  Lab 08/21/21 2118 08/24/21 1800 08/25/21 0426 08/26/21 0905  AST 19 29 24  36  ALT 22 24 22 29   ALKPHOS 99 95 78 78  BILITOT 0.7 1.1 0.6 0.5  PROT 7.5 8.2* 6.7 7.3  ALBUMIN 3.9 3.6 3.0* 3.1*   No results for input(s): LIPASE, AMYLASE  in the last 168 hours. No results for input(s): AMMONIA in the last 168 hours. Coagulation Profile: No results for input(s): INR, PROTIME in the last 168 hours. Cardiac Enzymes: No results for input(s): CKTOTAL, CKMB, CKMBINDEX, TROPONINI in the last 168 hours. BNP (last 3 results) No results for input(s): PROBNP in the last 8760 hours. HbA1C: No results for input(s): HGBA1C in the last 72 hours. CBG: No results for input(s): GLUCAP in the last 168 hours. Lipid Profile: No results for input(s): CHOL, HDL, LDLCALC, TRIG, CHOLHDL, LDLDIRECT in the last 72 hours. Thyroid Function Tests: No results for input(s): TSH, T4TOTAL, FREET4, T3FREE, THYROIDAB in the last 72 hours. Anemia  Panel: No results for input(s): VITAMINB12, FOLATE, FERRITIN, TIBC, IRON, RETICCTPCT in the last 72 hours. Urine analysis:    Component Value Date/Time   COLORURINE YELLOW 08/24/2021 2017   APPEARANCEUR CLEAR 08/24/2021 2017   LABSPEC 1.025 08/24/2021 2017   PHURINE 6.0 08/24/2021 2017   GLUCOSEU NEGATIVE 08/24/2021 2017   HGBUR LARGE (A) 08/24/2021 2017   BILIRUBINUR NEGATIVE 08/24/2021 2017   KETONESUR NEGATIVE 08/24/2021 2017   PROTEINUR >300 (A) 08/24/2021 2017   NITRITE NEGATIVE 08/24/2021 2017   LEUKOCYTESUR MODERATE (A) 08/24/2021 2017   Sepsis Labs: @LABRCNTIP (procalcitonin:4,lacticidven:4)  ) Recent Results (from the past 240 hour(s))  Urine Culture     Status: Abnormal   Collection Time: 08/21/21 11:04 PM   Specimen: Urine, Catheterized  Result Value Ref Range Status   Specimen Description   Final    URINE, CATHETERIZED Performed at [facility], [place] [address] [place], [place] [address]    Special Requests   Final    NONE Performed at [facility], [place] [address] [place], [place] [address]    Culture >=100,000 COLONIES/mL ENTEROBACTER CLOACAE (A)  Final   Report Status 08/24/2021 FINAL  Final   Organism ID, Bacteria ENTEROBACTER CLOACAE (A)   Final      Susceptibility   Enterobacter cloacae - MIC*    CEFAZOLIN >=64 RESISTANT Resistant     CEFEPIME <=0.12 SENSITIVE Sensitive     CIPROFLOXACIN <=0.25 SENSITIVE Sensitive     GENTAMICIN <=1 SENSITIVE Sensitive     IMIPENEM 1 SENSITIVE Sensitive     NITROFURANTOIN 32 SENSITIVE Sensitive     TRIMETH/SULFA <=20 SENSITIVE Sensitive     PIP/TAZO >=128 RESISTANT Resistant     * >=100,000 COLONIES/mL ENTEROBACTER CLOACAE  Resp Panel by RT-PCR (Flu A&B, Covid) Nasopharyngeal Swab     Status: None   Collection Time: 08/24/21  4:30 PM   Specimen: Nasopharyngeal Swab; Nasopharyngeal(NP) swabs in vial transport medium  Result Value Ref Range Status   SARS Coronavirus 2 by RT PCR NEGATIVE NEGATIVE Final    Comment: (NOTE) SARS-CoV-2 target nucleic acids are NOT DETECTED.  The SARS-CoV-2 RNA is generally detectable in upper respiratory specimens during the acute phase of infection. The lowest concentration of SARS-CoV-2 viral copies this assay can detect is 138 copies/mL. A negative result does not preclude SARS-Cov-2 infection and should not be used as the sole basis for treatment or other patient management decisions. A negative result may occur with  improper specimen collection/handling, submission of specimen other than nasopharyngeal swab, presence of viral mutation(s) within the areas targeted by this assay, and inadequate number of viral copies(<138 copies/mL). A negative result must be combined with clinical observations, patient history, and epidemiological information. The expected result is Negative.  Fact Sheet for Patients:  EntrepreneurPulse.com.au  Fact Sheet for Healthcare Providers:  IncredibleEmployment.be  This test is no t yet approved or cleared by the [place] FDA and  has been authorized for detection and/or diagnosis of SARS-CoV-2 by FDA under an Emergency Use Authorization (EUA). This EUA will remain  in effect  (meaning this test can be used) for the duration of the COVID-19 declaration under Section 564(b)(1) of the Act, 21 U.S.C.section 360bbb-3(b)(1), unless the authorization is terminated  or revoked sooner.       Influenza A by PCR NEGATIVE NEGATIVE Final   Influenza B by PCR NEGATIVE NEGATIVE Final    Comment: (NOTE) The Xpert Xpress SARS-CoV-2/FLU/RSV plus assay is intended as an aid in the diagnosis  of influenza from Nasopharyngeal swab specimens and should not be used as a sole basis for treatment. Nasal washings and aspirates are unacceptable for Xpert Xpress SARS-CoV-2/FLU/RSV testing.  Fact Sheet for Patients: EntrepreneurPulse.com.au  Fact Sheet for Healthcare Providers: IncredibleEmployment.be  This test is not yet approved or cleared by the [place] FDA and has been authorized for detection and/or diagnosis of SARS-CoV-2 by FDA under an Emergency Use Authorization (EUA). This EUA will remain in effect (meaning this test can be used) for the duration of the COVID-19 declaration under Section 564(b)(1) of the Act, 21 U.S.C. section 360bbb-3(b)(1), unless the authorization is terminated or revoked.  Performed at [facility], [place] [address], [place], [place] [address]   Urine Culture     Status: Abnormal   Collection Time: 08/25/21  4:45 PM   Specimen: Urine, Clean Catch  Result Value Ref Range Status   Specimen Description   Final    URINE, CLEAN CATCH Performed at [facility], [place] [address], [place], [place] [address]    Special Requests   Final    NONE Performed at [facility], [place] [address], [place], [place] [address]    Culture (A)  Final    <10,000 COLONIES/mL INSIGNIFICANT GROWTH Performed at [place] [address] [place], [place] [address]    Report Status 08/26/2021 FINAL  Final  Culture, blood (routine x 2)     Status: None (Preliminary  result)   Collection Time: 08/25/21  5:17 PM   Specimen: BLOOD RIGHT HAND  Result Value Ref Range Status   Specimen Description   Final    BLOOD RIGHT HAND Performed at [place] [address] [place], [place] [address]    Special Requests   Final    BOTTLES DRAWN AEROBIC AND ANAEROBIC Blood Culture results may not be optimal due to an inadequate volume of blood received in culture bottles Performed at [place] [address] [place], [place] [address]    Culture   Final    NO GROWTH 3 DAYS Performed at [facility], [place] [address], [place], [place] [address]    Report Status PENDING  Incomplete  Culture, blood (routine x 2)     Status: None (Preliminary result)   Collection Time: 08/25/21  5:17 PM   Specimen: BLOOD LEFT HAND  Result Value Ref Range Status   Specimen Description   Final    BLOOD LEFT HAND Performed at [place] [address], [place], [place] [address]    Special Requests   Final    BOTTLES DRAWN AEROBIC AND ANAEROBIC Blood Culture adequate volume Performed at [place] [address], [place], [place] [address]    Culture   Final    NO GROWTH 3 DAYS Performed at [facility], [place] [address], [place], [place] [address]    Report Status PENDING  Incomplete      Studies: No results found.  Scheduled Meds:  alprazolam  2 mg Oral q morning   buPROPion  300 mg Oral q morning   cyclobenzaprine  20 mg Oral QHS   enoxaparin (LOVENOX) injection  40 mg Subcutaneous QHS   ketorolac  15 mg Intravenous Q6H   oxybutynin  5 mg Oral TID   rOPINIRole  1 mg Oral QHS   senna-docusate  2 tablet Oral BID    Continuous Infusions:  ceFEPime (MAXIPIME) IV 2  g (08/28/21 1435)     LOS: 3 days     Kayleen Memos, MD [facility] Pager [phone]  If 7PM-7AM, please contact night-coverage [email or web address] Password [email or web address] 08/28/2021, 3:12 PM

## 2021-08-28 NOTE — Plan of Care (Signed)
  Problem: Education: Goal: Knowledge of General Education information will improve Description: Including pain rating scale, medication(s)/side effects and non-pharmacologic comfort measures Outcome: Progressing   Problem: Health Behavior/Discharge Planning: Goal: Ability to manage health-related needs will improve Outcome: Progressing   Problem: Clinical Measurements: Goal: Ability to maintain clinical measurements within normal limits will improve Outcome: Progressing Goal: Will remain free from infection Outcome: Progressing Goal: Diagnostic test results will improve Outcome: Progressing   Problem: Activity: Goal: Risk for activity intolerance will decrease Outcome: Progressing   Problem: Nutrition: Goal: Adequate nutrition will be maintained Outcome: Progressing   Problem: Elimination: Goal: Will not experience complications related to bowel motility Outcome: Progressing Goal: Will not experience complications related to urinary retention Outcome: Progressing   Problem: Pain Managment: Goal: General experience of comfort will improve Outcome: Progressing   Problem: Safety: Goal: Ability to remain free from injury will improve Outcome: Progressing

## 2021-08-29 DIAGNOSIS — C20 Malignant neoplasm of rectum: Secondary | ICD-10-CM

## 2021-08-29 DIAGNOSIS — A419 Sepsis, unspecified organism: Secondary | ICD-10-CM | POA: Diagnosis not present

## 2021-08-29 DIAGNOSIS — A498 Other bacterial infections of unspecified site: Secondary | ICD-10-CM | POA: Diagnosis not present

## 2021-08-29 DIAGNOSIS — N390 Urinary tract infection, site not specified: Secondary | ICD-10-CM | POA: Diagnosis not present

## 2021-08-29 LAB — BASIC METABOLIC PANEL
Anion gap: 8 (ref 5–15)
BUN: 17 mg/dL (ref 6–20)
CO2: 23 mmol/L (ref 22–32)
Calcium: 9.1 mg/dL (ref 8.9–10.3)
Chloride: 108 mmol/L (ref 98–111)
Creatinine, Ser: 1.01 mg/dL — ABNORMAL HIGH (ref 0.44–1.00)
GFR, Estimated: >60 mL/min (ref >60)
Glucose, Bld: 93 mg/dL (ref 70–99)
Potassium: 3.9 mmol/L (ref 3.5–5.1)
Sodium: 139 mmol/L (ref 135–145)

## 2021-08-29 LAB — CBC
HCT: 33.1 % — ABNORMAL LOW (ref 36.0–46.0)
Hemoglobin: 10.7 g/dL — ABNORMAL LOW (ref 12.0–15.0)
MCH: 30.2 pg (ref 26.0–34.0)
MCHC: 32.3 g/dL (ref 30.0–36.0)
MCV: 93.5 fL (ref 80.0–100.0)
Platelets: 233 10*3/uL (ref 150–400)
RBC: 3.54 MIL/uL — ABNORMAL LOW (ref 3.87–5.11)
RDW: 13.3 % (ref 11.5–15.5)
WBC: 4.8 10*3/uL (ref 4.0–10.5)
nRBC: 0 % (ref 0.0–0.2)

## 2021-08-29 MED ORDER — ACETAMINOPHEN 325 MG PO TABS
650.0000 mg | ORAL_TABLET | Freq: Four times a day (QID) | ORAL | Status: DC
  Administered 2021-08-29 – 2021-08-30 (×5): 650 mg via ORAL
  Filled 2021-08-29 (×5): qty 2

## 2021-08-29 MED ORDER — KETOROLAC TROMETHAMINE 15 MG/ML IJ SOLN: 15.0000 mg | Freq: Four times a day (QID) | INTRAMUSCULAR | Status: DC | PRN

## 2021-08-29 MED ORDER — BISACODYL 10 MG RE SUPP: 10.0000 mg | Freq: Every day | RECTAL | Status: DC | PRN

## 2021-08-29 MED ORDER — MORPHINE SULFATE (PF) 2 MG/ML IV SOLN
2.0000 mg | INTRAVENOUS | Status: DC | PRN
  Administered 2021-08-30: 2 mg via INTRAVENOUS
  Filled 2021-08-29: qty 1

## 2021-08-29 NOTE — Progress Notes (Addendum)
PROGRESS NOTE  Norma Colon KMQ:[id number] DOB: [birth year] DOA: 08/24/2021 PCP: Dian Queen, MD  HPI: Norma Colon is a 60 y.o. female with history of metastatic rectal carcinoma who has had a procedure for ablation of the liver lesion in October following which patient had a ureteral injury 07/31/21 while attempting to place a foley catheter and she underwent urgent right ureteral stent placement.  She was scheduled for right diagnostic ureteroscopy in 2 weeks to ensure closure of the urethral injury.  2 days prior to presentation to the ED she developed worsening urinary urgency, frequency and dysuria.  She also developed fevers.  She has had intermittent right flank pain with a right ureteral stent in place.  CT scan shows a right ureteral stent in good position with no urine leak.    She was admitted for sepsis due to a complicated UTI.   SUBJECTIVE No acute events overnight. She reports continued pain especially around the umbilicus. Tolerating PO, has some challenges with constipation but senna helped move her bowels.  Reports that her "fear" is that her cancer will show up somewhere else and be "aggressive."   Assessment/Plan: Principal Problem:   Sepsis ([place]) Active Problems:   Adenocarcinoma of rectum ([place])   Rectal cancer ([place])   Acute lower UTI   Complicated UTI (urinary tract infection)   Infection due to Enterobacter cloacae   Hypokalemia   Cough    Enterobacter cloacae UTI Sepsis on admission, resolved Hx of right distal ureteral injury when foley inflated in R ureter (now s/p right ureteral stent placement). Fever, tachycardiac, tachypnea and urinary source w/initial hypotension. - Blood cultures negative to date. - Urine culture collected on 08/21/2021 grew greater than 100,000 colonies of Enterobacter cloacae resistant to cefazolin Ancef. - CT with stable position of r ureteral stent, no change in R periureteral fat stranding - Continue 10 day  course of Cefepime (start date 11/17 - - Urology consulted 11/21, note still pending  Chronic pain Acutely worsened in the setting of infection - adjust regimen as able  Non anion gap Metabolic acidosis Improving, serum bicarb uptrending. - trend BMP  Resolved post repletion.  Hypomagnesemia Serum magnesium 1.6>> 2.3.   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 Benay Spice outpatient  - multimodal Pain control: scheduled APAP, oxy prn   Resolved hyponatremia & hypokalemia Serum sodium 136 from 134. Serum potassium 4.1 from 3.0.  History of headaches Symptomatic management as needed  Constipation Senokot 2 tablets twice daily MiraLAX as needed  Anxiety - alprazolam   DVT prophylaxis: lovenox subcu daily Code Status: full Family Communication: None at bedside. Disposition:   Status is: Inpatient  Remains inpatient appropriate because: need for IV abx, likely dc 11/27 vs 28    Consultants:  urology   Procedures:  none   Antimicrobials:   Anti-infectives (From admission, onward)    Start     Dose/Rate Route Frequency Ordered Stop   08/24/21 2245  ceFEPIme (MAXIPIME) 2 g in sodium chloride 0.9 % 100 mL IVPB        2 g 200 mL/hr over 30 Minutes Intravenous Every 8 hours 08/24/21 2239     08/24/21 2130  cefTRIAXone (ROCEPHIN) 1 g in sodium chloride 0.9 % 100 mL IVPB        1 g 200 mL/hr over 30 Minutes Intravenous  Once 08/24/21 2122 08/24/21 2207  Objective: Vitals:   08/28/21 0532 08/28/21 1332 08/28/21 2009 08/29/21 0517  BP: 109/66 131/87 127/83 124/75  Pulse: 82 77 80 80  Resp: 18 20 18 18   Temp: 98.2 F (36.8 C) 97.9 F (36.6 C) (!) 97.5 F (36.4 C) 97.8 F (36.6 C)  TempSrc: Oral Oral Axillary Oral  SpO2: 93% 99% 98% 95%  Weight:      Height:        Intake/Output Summary (Last 24 hours) at 08/29/2021 1020 Last data filed at  08/28/2021 1300 Gross per 24 hour  Intake 240 ml  Output --  Net 240 ml    Filed Weights   08/24/21 1140  Weight: 98 kg    Exam:  General: 60 y.o. year-old female well-developed well-nourished in no acute distress.  She is alert and oriented x3.  Cardiovascular: Regular rate and rhythm, NL S1/S2, no rubs or gallops. Respiratory: Clear to auscultation no wheezes or rales.  Abdomen: Soft right flank tenderness with palpation. Umbilical tenderness. Mild distention. Bowel sounds present.   Musculoskeletal: No lower extremity edema bilaterally.   Skin: No ulcerative lesions noted. Psychiatry: Mood is appropriate for condition and setting.   Data Reviewed: CBC: Recent Labs  Lab 08/24/21 1800 08/25/21 0426 08/26/21 0905 08/28/21 0722 08/29/21 0458  WBC 8.0 4.7 4.3 4.4 4.8  NEUTROABS 6.7 3.2 2.6  --   --   HGB 12.1 10.4* 10.6* 10.8* 10.7*  HCT 37.0 32.0* 33.1* 32.9* 33.1*  MCV 93.9 94.4 94.6 93.7 93.5  PLT 196 157 184 203 751    Basic Metabolic Panel: Recent Labs  Lab 08/24/21 1800 08/25/21 0426 08/26/21 0905 08/28/21 0722 08/29/21 0458  NA 135 134* 139 136 139  K 3.3* 3.0* 4.1 4.2 3.9  CL 99 104 109 105 108  CO2 25 23 20* 21* 23  GLUCOSE 104* 110* 111* 85 93  BUN 22* 28* 17 18 17   CREATININE 1.18* 1.21* 0.99 1.00 1.01*  CALCIUM 8.7* 7.9* 8.5* 8.6* 9.1  MG  --   --   --  2.3  --   PHOS  --   --   --  4.1  --     GFR: Estimated Creatinine Clearance: 74.3 mL/min (A) (by C-G formula based on SCr of 1.01 mg/dL (H)). Liver Function Tests: Recent Labs  Lab 08/24/21 1800 08/25/21 0426 08/26/21 0905  AST 29 24 36  ALT 24 22 29   ALKPHOS 95 78 78  BILITOT 1.1 0.6 0.5  PROT 8.2* 6.7 7.3  ALBUMIN 3.6 3.0* 3.1*    No results for input(s): LIPASE, AMYLASE in the last 168 hours. No results for input(s): AMMONIA in the last 168 hours. Coagulation Profile: No results for input(s): INR, PROTIME in the last 168 hours. Cardiac Enzymes: No results for input(s):  CKTOTAL, CKMB, CKMBINDEX, TROPONINI in the last 168 hours. BNP (last 3 results) No results for input(s): PROBNP in the last 8760 hours. HbA1C: No results for input(s): HGBA1C in the last 72 hours. CBG: No results for input(s): GLUCAP in the last 168 hours. Lipid Profile: No results for input(s): CHOL, HDL, LDLCALC, TRIG, CHOLHDL, LDLDIRECT in the last 72 hours. Thyroid Function Tests: No results for input(s): TSH, T4TOTAL, FREET4, T3FREE, THYROIDAB in the last 72 hours. Anemia Panel: No results for input(s): VITAMINB12, FOLATE, FERRITIN, TIBC, IRON, RETICCTPCT in the last 72 hours. Urine analysis:    Component Value Date/Time   COLORURINE YELLOW 08/24/2021 2017   APPEARANCEUR CLEAR 08/24/2021 2017   LABSPEC 1.025 08/24/2021 2017  PHURINE 6.0 08/24/2021 2017   GLUCOSEU NEGATIVE 08/24/2021 2017   HGBUR LARGE (A) 08/24/2021 2017   BILIRUBINUR NEGATIVE 08/24/2021 2017   KETONESUR NEGATIVE 08/24/2021 2017   PROTEINUR >300 (A) 08/24/2021 2017   NITRITE NEGATIVE 08/24/2021 2017   LEUKOCYTESUR MODERATE (A) 08/24/2021 2017   Sepsis Labs: @LABRCNTIP (procalcitonin:4,lacticidven:4)  ) Recent Results (from the past 240 hour(s))  Urine Culture     Status: Abnormal   Collection Time: 08/21/21 11:04 PM   Specimen: Urine, Catheterized  Result Value Ref Range Status   Specimen Description   Final    URINE, CATHETERIZED Performed at [facility], [place] [address] [place], [place] [address]    Special Requests   Final    NONE Performed at [facility], [place] [address] [place], [place] [address]    Culture >=100,000 COLONIES/mL ENTEROBACTER CLOACAE (A)  Final   Report Status 08/24/2021 FINAL  Final   Organism ID, Bacteria ENTEROBACTER CLOACAE (A)  Final      Susceptibility   Enterobacter cloacae - MIC*    CEFAZOLIN >=64 RESISTANT Resistant     CEFEPIME <=0.12 SENSITIVE Sensitive     CIPROFLOXACIN <=0.25 SENSITIVE Sensitive     GENTAMICIN <=1  SENSITIVE Sensitive     IMIPENEM 1 SENSITIVE Sensitive     NITROFURANTOIN 32 SENSITIVE Sensitive     TRIMETH/SULFA <=20 SENSITIVE Sensitive     PIP/TAZO >=128 RESISTANT Resistant     * >=100,000 COLONIES/mL ENTEROBACTER CLOACAE  Resp Panel by RT-PCR (Flu A&B, Covid) Nasopharyngeal Swab     Status: None   Collection Time: 08/24/21  4:30 PM   Specimen: Nasopharyngeal Swab; Nasopharyngeal(NP) swabs in vial transport medium  Result Value Ref Range Status   SARS Coronavirus 2 by RT PCR NEGATIVE NEGATIVE Final    Comment: (NOTE) SARS-CoV-2 target nucleic acids are NOT DETECTED.  The SARS-CoV-2 RNA is generally detectable in upper respiratory specimens during the acute phase of infection. The lowest concentration of SARS-CoV-2 viral copies this assay can detect is 138 copies/mL. A negative result does not preclude SARS-Cov-2 infection and should not be used as the sole basis for treatment or other patient management decisions. A negative result may occur with  improper specimen collection/handling, submission of specimen other than nasopharyngeal swab, presence of viral mutation(s) within the areas targeted by this assay, and inadequate number of viral copies(<138 copies/mL). A negative result must be combined with clinical observations, patient history, and epidemiological information. The expected result is Negative.  Fact Sheet for Patients:  EntrepreneurPulse.com.au  Fact Sheet for Healthcare Providers:  IncredibleEmployment.be  This test is no t yet approved or cleared by the [place] FDA and  has been authorized for detection and/or diagnosis of SARS-CoV-2 by FDA under an Emergency Use Authorization (EUA). This EUA will remain  in effect (meaning this test can be used) for the duration of the COVID-19 declaration under Section 564(b)(1) of the Act, 21 U.S.C.section 360bbb-3(b)(1), unless the authorization is terminated  or revoked sooner.        Influenza A by PCR NEGATIVE NEGATIVE Final   Influenza B by PCR NEGATIVE NEGATIVE Final    Comment: (NOTE) The Xpert Xpress SARS-CoV-2/FLU/RSV plus assay is intended as an aid in the diagnosis of influenza from Nasopharyngeal swab specimens and should not be used as a sole basis for treatment. Nasal washings and aspirates are unacceptable for Xpert Xpress SARS-CoV-2/FLU/RSV testing.  Fact Sheet for Patients: EntrepreneurPulse.com.au  Fact Sheet for Healthcare Providers: IncredibleEmployment.be  This test  is not yet approved or cleared by the [place] and has been authorized for detection and/or diagnosis of SARS-CoV-2 by FDA under an Emergency Use Authorization (EUA). This EUA will remain in effect (meaning this test can be used) for the duration of the COVID-19 declaration under Section 564(b)(1) of the Act, 21 U.S.C. section 360bbb-3(b)(1), unless the authorization is terminated or revoked.  Performed at [facility], [place] [address], [place], [place] [address]   Urine Culture     Status: Abnormal   Collection Time: 08/25/21  4:45 PM   Specimen: Urine, Clean Catch  Result Value Ref Range Status   Specimen Description   Final    URINE, CLEAN CATCH Performed at [facility], [place] [address], [place], [place] [address]    Special Requests   Final    NONE Performed at [facility], [place] [address] [place], [place] [address]    Culture (A)  Final    <10,000 COLONIES/mL INSIGNIFICANT GROWTH Performed at [place] [address], [place], [place] [address]    Report Status 08/26/2021 FINAL  Final  Culture, blood (routine x 2)     Status: None (Preliminary result)   Collection Time: 08/25/21  5:17 PM   Specimen: BLOOD RIGHT HAND  Result Value Ref Range Status   Specimen Description   Final    BLOOD RIGHT HAND Performed at [place] [address] [place], [place] [address]    Special Requests   Final    BOTTLES DRAWN AEROBIC AND ANAEROBIC Blood Culture results may not be optimal due to an inadequate volume of blood received in culture bottles Performed at [place] [address] [place], [place] [address]    Culture   Final    NO GROWTH 3 DAYS Performed at [facility], [place] [address], [place], [place] [address]    Report Status PENDING  Incomplete  Culture, blood (routine x 2)     Status: None (Preliminary result)   Collection Time: 08/25/21  5:17 PM   Specimen: BLOOD LEFT HAND  Result Value Ref Range Status   Specimen Description   Final    BLOOD LEFT HAND Performed at [place] [address], [place], [place] [address]    Special Requests   Final    BOTTLES DRAWN AEROBIC AND ANAEROBIC Blood Culture adequate volume Performed at [place] [address] [place], [place] [address]    Culture   Final    NO GROWTH 3 DAYS Performed at [facility], [place] [address], [place], [place] [address]    Report Status PENDING  Incomplete      Studies: No results found.  Scheduled Meds:  acetaminophen  650 mg Oral Q6H   alprazolam  2 mg Oral q morning   buPROPion  300 mg Oral q morning   cyclobenzaprine  20 mg Oral QHS   enoxaparin (LOVENOX) injection  40 mg Subcutaneous QHS   oxybutynin  5 mg Oral TID   rOPINIRole  1 mg Oral QHS   senna-docusate  2 tablet Oral BID    Continuous Infusions:  ceFEPime (MAXIPIME) IV 2 g (08/29/21 8182)     LOS: 4 days     Cecille Rubin, MD MPH [facility] Pager [phone]  If 7PM-7AM, please contact night-coverage  08/29/2021, 10:20 AM

## 2021-08-30 DIAGNOSIS — A419 Sepsis, unspecified organism: Secondary | ICD-10-CM | POA: Diagnosis not present

## 2021-08-30 LAB — CULTURE, BLOOD (ROUTINE X 2)
Culture: NO GROWTH
Culture: NO GROWTH
Special Requests: ADEQUATE

## 2021-08-30 MED ORDER — HYDROCODONE-ACETAMINOPHEN 10-325 MG PO TABS: 1.0000 | ORAL_TABLET | Freq: Four times a day (QID) | ORAL | 0 refills | Status: DC | PRN

## 2021-08-30 MED ORDER — CIPROFLOXACIN HCL 500 MG PO TABS: 500.0000 mg | ORAL_TABLET | Freq: Two times a day (BID) | ORAL | 0 refills | Status: AC

## 2021-08-30 NOTE — Progress Notes (Signed)
Patient will be discharging later today. Belongings were returned to patient. Medication education will be provided.

## 2021-08-30 NOTE — Discharge Summary (Signed)
Norma Colon HTX:[id number] DOB: 1961/08/25 DOA: 08/24/2021  PCP: Dian Queen, MD  Admit date: 08/24/2021 Discharge date: 08/30/2021  Time spent: 45 minutes  Recommendations for Outpatient Follow-up:  Urology f/u 12/1 (surgery)     Discharge Diagnoses:  Principal Problem:   Sepsis [facility] Active Problems:   Adenocarcinoma of rectum ([place])   Rectal cancer ([place])   Acute lower UTI   Complicated UTI (urinary tract infection)   Infection due to Enterobacter cloacae   Hypokalemia   Cough   Discharge Condition: stable  Diet recommendation: heart healthy  Filed Weights   08/24/21 1140  Weight: 98 kg    History of present illness:  Norma Colon is a 60 y.o. female with history of metastatic rectal carcinoma who has had a procedure for ablation of the liver lesion in October following which patient had a ureteral injury stent placed and come to the ER 2 days ago after patient had hematuria and increasing pain during which patient's Foley catheter was removed and patient was placed on antibiotics.  Despite taking antibiotics patient was still having fever chills according temperatures up to 104 at home.  Patient presents to the ER.  Denies any chest pain or shortness of breath nausea or vomiting or diarrhea.  Hospital Course:  Hx of right distal ureteral injury when foley inflated in R ureter (now s/p right ureteral stent placement). presented w/ urosepsis 2/2 enterobacter. Sepsis by fever, tachycardia. Sepsis physiology resolved. CT with stable position of right ureteral stent. Patient reports RUQ pain after ablation procedure last month; CT of abdomen shows no complications. Patient was treated here with 6 days cefepime. Today I reviewed culture results with pharmacy who confirmed oral quinolone adequate to treat. Thus will discharge to finish 10 day course of ciprofloxacin (7 day course of quinolone is standard but patient previously told would need 10 days of  antibiotics and she is very resistant to stop abx after tomorrow). For her metastatic rectal cancer she will need f/u with her oncologist and gastroenterologist. On day of discharge I touched base w/ urology Dr. Alyson Ingles to confirm f/u plan. She has cystoscopy scheduled w/ him on 12/1 and he said that remains the plan.   Procedures: none   Consultations: urology  Discharge Exam: Vitals:   08/30/21 0100 08/30/21 0356  BP:  122/82  Pulse:  82  Resp: 17 20  Temp:  97.8 F (36.6 C)  SpO2:  97%    General: NAD Cardiovascular: RRR Respiratory: CTAB Abdomen: soft, TTP RUQ  Discharge Instructions   Discharge Instructions     Diet - low sodium heart healthy   Complete by: As directed    Increase activity slowly   Complete by: As directed       Allergies as of 08/30/2021       Reactions   Prednisone Other (See Comments)   "Felt like she was crazy" if on it more than 1 week   Codeine Other (See Comments)   Causes migraine--patient requests this be kept on allergy list        Medication List     STOP taking these medications    nitrofurantoin (macrocrystal-monohydrate) 100 MG capsule Commonly known as: MACROBID   oxybutynin 5 MG tablet Commonly known as: DITROPAN       TAKE these medications    alprazolam 2 MG tablet Commonly known as: XANAX Take 2 mg by mouth every morning.   buPROPion 300 MG 24 hr tablet Commonly known as: WELLBUTRIN XL Take  300 mg by mouth every morning.   ciprofloxacin 500 MG tablet Commonly known as: CIPRO Take 1 tablet (500 mg total) by mouth 2 (two) times daily for 7 days.   clotrimazole-betamethasone cream Commonly known as: Lotrisone Apply 1 application topically 2 (two) times daily.   cyclobenzaprine 10 MG tablet Commonly known as: FLEXERIL Take 20 mg by mouth at bedtime.   diphenoxylate-atropine 2.5-0.025 MG tablet Commonly known as: LOMOTIL TAKE 1 TO 2 TABLETS BY MOUTH FOUR TIMES DAILY AS NEEDED FOR DIARRHEA OR  LOOSE STOOLS   HYDROcodone-acetaminophen 10-325 MG tablet Commonly known as: NORCO Take 1-2 tablets by mouth every 6 (six) hours as needed for moderate pain or severe pain (headache). Post-Operatively.   lidocaine-prilocaine cream Commonly known as: EMLA Apply 1 application topically as directed. Apply 1-2 hours prior to IV stick and cover with plastic wrap   lubiprostone 24 MCG capsule Commonly known as: AMITIZA Take 1 capsule (24 mcg total) by mouth 2 (two) times daily with a meal.   Na Sulfate-K Sulfate-Mg Sulf 17.5-3.13-1.6 GM/177ML Soln Take 1 kit by mouth as directed. May use generic Suprep   rOPINIRole 1 MG tablet Commonly known as: REQUIP Take 1 mg by mouth at bedtime.       Allergies  Allergen Reactions   Prednisone Other (See Comments)    "Felt like she was crazy" if on it more than 1 week   Codeine Other (See Comments)    Causes migraine--patient requests this be kept on allergy list    Follow-up Information     Dian Queen, MD Follow up.   Specialty: Obstetrics and Gynecology Contact information: [address] [place] [address] [phone]         Follow-up with your cancer doctor and your GI doctor as well, and follow-up with urology for your surgery on 12/1 Follow up.                   The results of significant diagnostics from this hospitalization (including imaging, microbiology, ancillary and laboratory) are listed below for reference.    Significant Diagnostic Studies: CT ABDOMEN PELVIS WO CONTRAST  Result Date: 07/31/2021 CLINICAL DATA:  Lower abdominal pain with acute renal failure. Suprapubic pain. 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 EXAM: CT ABDOMEN AND PELVIS WITHOUT CONTRAST TECHNIQUE: Multidetector CT imaging of the abdomen and pelvis was performed following the standard protocol without IV contrast. COMPARISON:  06/17/2021 FINDINGS: Lower chest: Basilar atelectasis bilaterally. Hepatobiliary:  Ablation defect noted posterior right liver, more pronounced than on the prior study with posterior high attenuation material likely treatment related or small focus of hemorrhage. Similar appearance of reported ablation defect lateral segment left liver. The liver shows diffusely decreased attenuation suggesting fat deposition. Tiny hyperenhancing lesion medial segment left liver seen on the previous study is not evident on today's exam. High attenuation material in the gallbladder lumen is compatible with vicarious excretion of contrast material. Gallstones evident. No intrahepatic or extrahepatic biliary dilation. Pancreas: No focal mass lesion. No dilatation of the main duct. No intraparenchymal cyst. No peripancreatic edema. Spleen: No splenomegaly. No focal mass lesion. Adrenals/Urinary Tract: No adrenal nodule or mass. Contrast excretion of both kidneys compatible with contrast material from yesterday's CT scan. No evidence for hydroureter. The urinary bladder appears normal for the degree of distention. Stomach/Bowel: Stomach is moderately distended with food and fluid. Duodenum is normally positioned as is the ligament of Treitz. No small bowel wall thickening. No small bowel dilatation. The  terminal ileum is normal. The appendix is not well visualized, but there is no edema or inflammation in the region of the cecum. Prominent stool volume right and transverse colon. Suture line noted in the rectum. Vascular/Lymphatic: No abdominal aortic aneurysm. No abdominal lymphadenopathy. No pelvic sidewall lymphadenopathy. Reproductive: No adnexal mass. Other: Since yesterday's study, there is moderate volume high attenuation free fluid in the abdomen and pelvis, seen around the liver and spleen, within the mesentery, and in the cul-de-sac. Attenuation of the fluid is higher than would be expected for serous fluid suggesting hemoperitoneum or opacified fluid. Of note, there is fairly high attenuation fluid in the  left lateral pelvis adjacent to the acetabulum and in the posterior cul-de-sac to the right of the rectum. Musculoskeletal: No worrisome lytic or sclerotic osseous abnormality. L5-S1 spondylolisthesis is similar to prior. IMPRESSION: 1. Interval development of moderate volume high attenuation free fluid in the abdomen and pelvis. Attenuation of the fluid is higher than would be expected for serous fluid. I discussed this case with Dr. Laurence Ferrari at the time of interpretation. Of note, the patient developed progressive right-sided hydronephrosis and proximal hydroureter during the 1 hour ablation procedure yesterday, well demonstrated on localization scans performed on that study. On today's study, the right ureter can be seen to have an ectopic insertion high on the posterior dome of the bladder. The distal right ureter just proximal to the UVJ is irregular and there appears to be a string like subtle band of extravasated urine adjacent to the right UVJ. As such, these findings are concerning for distal right ureteral injury and ureteral leak as the etiology of the new intraperitoneal fluid. Given that the free fluid is intraperitoneal, this would suggest that the right UVJ is not retroperitoneal in location. However, the patient was already excreting contrast into the collecting systems and ureters by the end of yesterday's study and the intraperitoneal fluid today is not substantially opacified, possibly secondary to some reabsorption of the contrast material via the peritoneum although a component of hemoperitoneum could also have this appearance and cannot be entirely excluded on this exam. 2. Posterior right hepatic lobe ablation defect is increased in size in the interval, consistent with retreatment. Similar appearance of ablation defect in the lateral segment left liver. 3. Cholelithiasis. 4. Prominent stool volume right and transverse colon. Imaging features could be compatible with clinical constipation.  5. L5-S1 spondylolisthesis, similar to prior. Electronically Signed   By: Misty Stanley M.D.   On: 07/31/2021 15:53   DG Retrograde Pyelogram  Result Date: 08/01/2021 CLINICAL DATA:  Distal right ureteral injury from traumatic Foley catheter insertion. EXAM: INTRAOPERATIVE RIGHT RETROGRADE UROGRAPHY TECHNIQUE: Images were obtained with the C-arm fluoroscopic device intraoperatively and submitted for interpretation post-operatively. Please see the procedural report for the amount of contrast and the fluoroscopy time utilized. COMPARISON:  CT of the abdomen and pelvis on 07/31/2021 FINDINGS: Intraoperative imaging demonstrates cannulation of the right ureter with contrast injection demonstrating probable distal ureteral injury. A guidewire was advanced to the level of the collecting system and a ureteral stent placed. IMPRESSION: Retrograde urography demonstrating distal right ureteral injury. A right ureteral stent was placed. Electronically Signed   By: Aletta Edouard M.D.   On: 08/01/2021 07:56   DG CHEST PORT 1 VIEW  Result Date: 08/26/2021 CLINICAL DATA:  Cough, chest pain. EXAM: PORTABLE CHEST 1 VIEW COMPARISON:  08/21/2021. FINDINGS: The heart is normal in size and the mediastinal structures are stable. The distal tip of a right chest  port terminates over the superior vena cava. Mild atelectasis is present at the lung bases. No effusion or pneumothorax is seen. No acute osseous abnormality. IMPRESSION: Mild atelectasis at the lung bases. Electronically Signed   By: Brett Fairy M.D.   On: 08/26/2021 21:25   DG Abd Portable 1V  Result Date: 08/03/2021 CLINICAL DATA:  History of metastatic rectal cancer. Persistent lower abdominal/suprapubic discomfort EXAM: PORTABLE ABDOMEN - 1 VIEW COMPARISON:  CT scan 07/31/2021 FINDINGS: Right double-J ureteral stent is in good position without obvious complicating features. The abdominal bowel gas pattern is unremarkable. Scattered air-filled loops of small  bowel in the upper central abdomen no distension. Moderate stool throughout the colon and down into the rectum. No findings for obstruction or perforation. The soft tissue shadows are grossly maintained. No worrisome calcifications. IMPRESSION: 1. Right double-J ureteral stent in good position. 2. Unremarkable bowel gas pattern. Electronically Signed   By: Marijo Sanes M.D.   On: 08/03/2021 15:29   CT Renal Stone Study  Result Date: 08/24/2021 CLINICAL DATA:  Fever, recent Foley catheter remote, dysuria, history of metastatic rectal cancer to liver status post liver ablation EXAM: CT ABDOMEN AND PELVIS WITHOUT CONTRAST TECHNIQUE: Multidetector CT imaging of the abdomen and pelvis was performed following the standard protocol without IV contrast. Unenhanced CT was performed per clinician order. Lack of IV contrast limits sensitivity and specificity, especially for evaluation of abdominal/pelvic solid viscera. COMPARISON:  08/21/2021 FINDINGS: Lower chest: Subsegmental atelectasis within the lower lobes. No acute pleural or parenchymal lung disease. Hepatobiliary: 4.3 x 3.6 cm hypodensity within the inferior aspect right lobe liver again identified, consistent with the site of prior ablation. Previously this had measured 4.8 x 4.3 cm. Remainder of the liver is unremarkable. High attenuation material within the gallbladder consistent with vicarious excretion of previously administered contrast. Cholelithiasis is again noted without evidence of cholecystitis. Pancreas: Unremarkable. No pancreatic ductal dilatation or surrounding inflammatory changes. Spleen: Normal in size without focal abnormality. Adrenals/Urinary Tract: A right ureteral stent is again identified extending from the right renal pelvis into the bladder lumen. No change in the right Peri ureteral fat stranding seen previously. 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 The left kidney is  unremarkable.  No urinary tract calculi. Bladder is decompressed, limiting its evaluation. The adrenals are unremarkable. Stomach/Bowel: No bowel obstruction or ileus. Normal appendix right lower quadrant. No bowel wall thickening or inflammatory change. Vascular/Lymphatic: No significant vascular findings are present. No enlarged abdominal or pelvic lymph nodes. Reproductive: Uterus and bilateral adnexa are unremarkable. Other: No free fluid or free gas.  No abdominal wall hernia. Musculoskeletal: No acute or destructive bony lesions. There is grade 2 anterior spondylolisthesis of L5 on S1 unchanged. Reconstructed images demonstrate no additional findings. IMPRESSION: 1. Stable position of the right ureteral stent. No change in the right Peri ureteral fat stranding seen previously. 2. Decreased size of the right lobe liver hypodensity at the site of prior microwave ablation. 3. Cholelithiasis without acute cholecystitis. Electronically Signed   By: Randa Ngo M.D.   On: 08/24/2021 21:51   CT Renal Stone Study  Result Date: 08/21/2021 CLINICAL DATA:  Hematuria, history of prior distal right ureteral injury EXAM: CT ABDOMEN AND PELVIS WITHOUT CONTRAST TECHNIQUE: Multidetector CT imaging of the abdomen and pelvis was performed following the standard protocol without IV contrast. COMPARISON:  07/31/2021 FINDINGS: Lower chest: No acute abnormality. Hepatobiliary: Gallbladder is decompressed with multiple gallstones within. The  liver shows the focal cyst in the left lobe of the liver. There is a 4.7 x 4.2 cm hypodense area in the right lobe of the liver posterior consistent with prior microwave ablation. The overall appearance is similar to that seen on prior CT examination. Pancreas: Unremarkable. No pancreatic ductal dilatation or surrounding inflammatory changes. Spleen: Normal in size without focal abnormality. Adrenals/Urinary Tract: Adrenal glands are within normal limits. Left kidney is unremarkable.  Right kidney shows no hydronephrosis. Right ureteral stent is seen in satisfactory position consistent with the known history. Bladder is decompressed by Foley catheter. The previously seen hyperdense ascites has resolved in the interval. Stomach/Bowel: No obstructive or inflammatory changes of the colon are seen. The appendix is within normal limits. Small bowel and stomach are unremarkable. Vascular/Lymphatic: No significant vascular findings are present. No enlarged abdominal or pelvic lymph nodes. Reproductive: Uterus and bilateral adnexa are unremarkable. Other: No abdominal wall hernia or abnormality. No abdominopelvic ascites. Musculoskeletal: Stable grade 2 anterolisthesis of L5 on S1 is noted. IMPRESSION: Multiple gallstones without complicating factors. Microwave ablation defect within the right lobe of the liver. Right ureteral stent in satisfactory position. The previously seen hyperdense ascites has resolved in the interval. Electronically Signed   By: Inez Catalina M.D.   On: 08/21/2021 20:09   IR Radiologist Eval & Mgmt  Result Date: 08/12/2021 Please refer to notes tab for details about interventional procedure. (Op Note)  CT Angio Abd/Pel w/ and/or w/o  Result Date: 08/21/2021 CLINICAL DATA:  Hematuria and left-sided abdominal pain EXAM: CTA ABDOMEN AND PELVIS WITHOUT AND WITH CONTRAST TECHNIQUE: Multidetector CT imaging of the abdomen and pelvis was performed using the standard protocol during bolus administration of intravenous contrast. Multiplanar reconstructed images and MIPs were obtained and reviewed to evaluate the vascular anatomy. CONTRAST:  48m OMNIPAQUE IOHEXOL 350 MG/ML SOLN COMPARISON:  Noncontrast CT from earlier in the same day. FINDINGS: VASCULAR Aorta: Abdominal aorta is within normal limits without aneurysmal dilatation or dissection. Celiac: Patent without evidence of aneurysm, dissection, vasculitis or significant stenosis. SMA: Patent without evidence of aneurysm,  dissection, vasculitis or significant stenosis. Renals: Both renal arteries are patent without evidence of aneurysm, dissection, vasculitis, fibromuscular dysplasia or significant stenosis. IMA: Diminutive but patent. Inflow: Iliacs are within normal limits. Veins: No acute venous abnormality is noted. Circumaortic left renal vein is seen. Review of the MIP images confirms the above findings. NON-VASCULAR Lower chest: No acute abnormality. Hepatobiliary: Microwave ablation defect is again identified in the right lobe of the liver. Multiple gallstones are seen and stable similar to that seen on prior exam. Pancreas: Unremarkable. No pancreatic ductal dilatation or surrounding inflammatory changes. Spleen: Normal in size without focal abnormality. Adrenals/Urinary Tract: Adrenal glands are within normal limits. Kidneys demonstrate a normal enhancement pattern. Again there are changes consistent with right ureteral stent. No renal calculi or urinary tract obstructive changes are seen. Delayed images demonstrate normal enhancement of the kidneys. Subsequent delayed images show mild fullness of the ureter on right with ureteral stent in place. Normal excretion of contrast is seen. Bladder is again decompressed by Foley catheter. Delayed images through the bladder demonstrates some filling defect adjacent to the Foley catheter which may represent a small amount of thrombus. This would account for the hematuria. This is best seen on coronal image number 96 of series 17. Stomach/Bowel: Postsurgical changes are seen in the rectum better visualized on the current exam due to the thinner slice sections no obstructive or inflammatory changes are seen. The appendix  is within normal limits. Small bowel and stomach are again within normal limits. Lymphatic: No significant lymphadenopathy is seen. Reproductive: Uterus and bilateral adnexa are unremarkable. Other: No abdominal wall hernia or abnormality. No abdominopelvic ascites.  Musculoskeletal: Stable anterolisthesis of L5 on S1 is noted. IMPRESSION: VASCULAR No specific vascular abnormality is identified. NON-VASCULAR Filling defect is noted within the bladder on delayed images best noted on the coronal images consistent with mild hematoma within the bladder. This may be related to irritation from the Foley catheter or possibly ureteral stent. No other definitive abnormality is identified to correspond with these changes. Right ureteral stent in satisfactory position. Cholelithiasis without complicating factors. Microwave ablation defect within the right lobe of the liver. Electronically Signed   By: Inez Catalina M.D.   On: 08/21/2021 22:41    Microbiology: Recent Results (from the past 240 hour(s))  Urine Culture     Status: Abnormal   Collection Time: 08/21/21 11:04 PM   Specimen: Urine, Catheterized  Result Value Ref Range Status   Specimen Description   Final    URINE, CATHETERIZED Performed at [facility] [address], [place], [place] [address]    Special Requests   Final    NONE Performed at [facility], [place] [address], [place], [place] [address]    Culture >=100,000 COLONIES/mL ENTEROBACTER CLOACAE (A)  Final   Report Status 08/24/2021 FINAL  Final   Organism ID, Bacteria ENTEROBACTER CLOACAE (A)  Final      Susceptibility   Enterobacter cloacae - MIC*    CEFAZOLIN >=64 RESISTANT Resistant     CEFEPIME <=0.12 SENSITIVE Sensitive     CIPROFLOXACIN <=0.25 SENSITIVE Sensitive     GENTAMICIN <=1 SENSITIVE Sensitive     IMIPENEM 1 SENSITIVE Sensitive     NITROFURANTOIN 32 SENSITIVE Sensitive     TRIMETH/SULFA <=20 SENSITIVE Sensitive     PIP/TAZO >=128 RESISTANT Resistant     * >=100,000 COLONIES/mL ENTEROBACTER CLOACAE  Resp Panel by RT-PCR (Flu A&B, Covid) Nasopharyngeal Swab     Status: None   Collection Time: 08/24/21  4:30 PM   Specimen: Nasopharyngeal Swab; Nasopharyngeal(NP) swabs in vial transport medium   Result Value Ref Range Status   SARS Coronavirus 2 by RT PCR NEGATIVE NEGATIVE Final    Comment: (NOTE) SARS-CoV-2 target nucleic acids are NOT DETECTED.  The SARS-CoV-2 RNA is generally detectable in upper respiratory specimens during the acute phase of infection. The lowest concentration of SARS-CoV-2 viral copies this assay can detect is 138 copies/mL. A negative result does not preclude SARS-Cov-2 infection and should not be used as the sole basis for treatment or other patient management decisions. A negative result may occur with  improper specimen collection/handling, submission of specimen other than nasopharyngeal swab, presence of viral mutation(s) within the areas targeted by this assay, and inadequate number of viral copies(<138 copies/mL). A negative result must be combined with clinical observations, patient history, and epidemiological information. The expected result is Negative.  Fact Sheet for Patients:  EntrepreneurPulse.com.au  Fact Sheet for Healthcare Providers:  IncredibleEmployment.be  This test is no t yet approved or cleared by the [place] FDA and  has been authorized for detection and/or diagnosis of SARS-CoV-2 by FDA under an Emergency Use Authorization (EUA). This EUA will remain  in effect (meaning this test can be used) for the duration of the COVID-19 declaration under Section 564(b)(1) of the Act, 21 U.S.C.section 360bbb-3(b)(1), unless the authorization is terminated  or revoked sooner.  Influenza A by PCR NEGATIVE NEGATIVE Final   Influenza B by PCR NEGATIVE NEGATIVE Final    Comment: (NOTE) The Xpert Xpress SARS-CoV-2/FLU/RSV plus assay is intended as an aid in the diagnosis of influenza from Nasopharyngeal swab specimens and should not be used as a sole basis for treatment. Nasal washings and aspirates are unacceptable for Xpert Xpress SARS-CoV-2/FLU/RSV testing.  Fact Sheet for  Patients: EntrepreneurPulse.com.au  Fact Sheet for Healthcare Providers: IncredibleEmployment.be  This test is not yet approved or cleared by the [place] FDA and has been authorized for detection and/or diagnosis of SARS-CoV-2 by FDA under an Emergency Use Authorization (EUA). This EUA will remain in effect (meaning this test can be used) for the duration of the COVID-19 declaration under Section 564(b)(1) of the Act, 21 U.S.C. section 360bbb-3(b)(1), unless the authorization is terminated or revoked.  Performed at [facility], [place] [address] [place], [place] [address]   Urine Culture     Status: Abnormal   Collection Time: 08/25/21  4:45 PM   Specimen: Urine, Clean Catch  Result Value Ref Range Status   Specimen Description   Final    URINE, CLEAN CATCH Performed at [facility], [place] [address] [place], [place] [id number]    Special Requests   Final    NONE Performed at [facility], [place] [address], [place], [place] [address]    Culture (A)  Final    <10,000 COLONIES/mL INSIGNIFICANT GROWTH Performed at [facility] [address], [place], [place] [address]    Report Status 08/26/2021 FINAL  Final  Culture, blood (routine x 2)     Status: None   Collection Time: 08/25/21  5:17 PM   Specimen: BLOOD RIGHT HAND  Result Value Ref Range Status   Specimen Description   Final    BLOOD RIGHT HAND Performed at [facility] [address] [place], [place] [address]    Special Requests   Final    BOTTLES DRAWN AEROBIC AND ANAEROBIC Blood Culture results may not be optimal due to an inadequate volume of blood received in culture bottles Performed at [facility] [address], [place], [place] [address]    Culture   Final    NO GROWTH 5 DAYS Performed at [facility], [place] [address] [place], [place] [address]    Report  Status 08/30/2021 FINAL  Final  Culture, blood (routine x 2)     Status: None   Collection Time: 08/25/21  5:17 PM   Specimen: BLOOD LEFT HAND  Result Value Ref Range Status   Specimen Description   Final    BLOOD LEFT HAND Performed at [facility] [address], [place], [place] [address]    Special Requests   Final    BOTTLES DRAWN AEROBIC AND ANAEROBIC Blood Culture adequate volume Performed at [facility] [address], [place], [place] [address]    Culture   Final    NO GROWTH 5 DAYS Performed at [facility], [place] [address] [place],  [address]    Report Status 08/30/2021 FINAL  Final     Labs: Basic Metabolic Panel: Recent Labs  Lab 08/24/21 1800 08/25/21 0426 08/26/21 0905 08/28/21 0722 08/29/21 0458  NA 135 134* 139 136 139  K 3.3* 3.0* 4.1 4.2 3.9  CL 99 104 109 105 108  CO2 25 23 20* 21* 23  GLUCOSE 104* 110* 111*  85 93  BUN 22* 28* 17 18 17   CREATININE 1.18* 1.21* 0.99 1.00 1.01*  CALCIUM 8.7* 7.9* 8.5* 8.6* 9.1  MG  --   --   --  2.3  --   PHOS  --   --   --  4.1  --    Liver Function Tests: Recent Labs  Lab 08/24/21 1800 08/25/21 0426 08/26/21 0905  AST 29 24 36  ALT 24 22 29   ALKPHOS 95 78 78  BILITOT 1.1 0.6 0.5  PROT 8.2* 6.7 7.3  ALBUMIN 3.6 3.0* 3.1*   No results for input(s): LIPASE, AMYLASE in the last 168 hours. No results for input(s): AMMONIA in the last 168 hours. CBC: Recent Labs  Lab 08/24/21 1800 08/25/21 0426 08/26/21 0905 08/28/21 0722 08/29/21 0458  WBC 8.0 4.7 4.3 4.4 4.8  NEUTROABS 6.7 3.2 2.6  --   --   HGB 12.1 10.4* 10.6* 10.8* 10.7*  HCT 37.0 32.0* 33.1* 32.9* 33.1*  MCV 93.9 94.4 94.6 93.7 93.5  PLT 196 157 184 203 233   Cardiac Enzymes: No results for input(s): CKTOTAL, CKMB, CKMBINDEX, TROPONINI in the last 168 hours. BNP: BNP (last 3 results) No results for input(s): BNP in the last 8760 hours.  ProBNP (last 3 results) No results for input(s):  PROBNP in the last 8760 hours.  CBG: No results for input(s): GLUCAP in the last 168 hours.     Signed:  Desma Maxim MD.  [facility] 08/30/2021, 10:23 AM

## 2021-09-01 ENCOUNTER — Encounter: Payer: Self-pay | Admitting: Nurse Practitioner

## 2021-09-01 ENCOUNTER — Encounter: Payer: Self-pay | Admitting: Urology

## 2021-09-01 ENCOUNTER — Encounter: Payer: Self-pay | Admitting: Oncology

## 2021-09-01 NOTE — Patient Instructions (Signed)
Norma Colon  09/01/2021     @PREFPERIOPPHARMACY @   Your procedure is scheduled on  09/04/2021.   Report to [facility] at  [phone]  A.M.   Call this number if you have problems the morning of surgery:  [phone]   Remember:  Do not eat or drink after midnight.      Take these medicines the morning of surgery with A SIP OF WATER                      Xanax, wellbutrin, hydrocodone (if needed).     Do not wear jewelry, make-up or nail polish.  Do not wear lotions, powders, or perfumes, or deodorant.  Do not shave 48 hours prior to surgery.  Men may shave face and neck.  Do not bring valuables to the hospital.  [facility] is not responsible for any belongings or valuables.  Contacts, dentures or bridgework may not be worn into surgery.  Leave your suitcase in the car.  After surgery it may be brought to your room.  For patients admitted to the hospital, discharge time will be determined by your treatment team.  Patients discharged the day of surgery will not be allowed to drive home and must have someone with them for 24 hours.    Special instructions:  DO NOT smoke tobacco or vape fore 24 hours before your procedure.  Please read over the following fact sheets that you were given. Coughing and Deep Breathing, Surgical Site Infection Prevention, Anesthesia Post-op Instructions, and Care and Recovery After Surgery      Ureteral Stent Implantation, Care After This sheet gives you information about how to care for yourself after your procedure. Your health care provider may also give you more specific instructions. If you have problems or questions, contact your health care provider. What can I expect after the procedure? After the procedure, it is common to have: Nausea. Mild pain when you urinate. You may feel this pain in your lower back or lower abdomen. The pain should stop within a few minutes after you urinate. This may last for up to 1 week. A  small amount of blood in your urine for several days. Follow these instructions at home: Medicines Take over-the-counter and prescription medicines only as told by your health care provider. If you were prescribed an antibiotic medicine, take it as told by your health care provider. Do not stop taking the antibiotic even if you start to feel better. Do not drive for 24 hours if you were given a sedative during your procedure. Ask your health care provider if the medicine prescribed to you requires you to avoid driving or using heavy machinery. Activity Rest as told by your health care provider. Avoid sitting for a long time without moving. Get up to take short walks every 1-2 hours. This is important to improve blood flow and breathing. Ask for help if you feel weak or unsteady. Return to your normal activities as told by your health care provider. Ask your health care provider what activities are safe for you. General instructions  Watch for any blood in your urine. Call your health care provider if the amount of blood in your urine increases. If you have a catheter: Follow instructions from your health care provider about taking care of your catheter and collection bag. Do not take baths, swim, or use a hot tub until your health care provider approves. Ask  your health care provider if you may take showers. You may only be allowed to take sponge baths. Drink enough fluid to keep your urine pale yellow. Do not use any products that contain nicotine or tobacco, such as cigarettes, e-cigarettes, and chewing tobacco. These can delay healing after surgery. If you need help quitting, ask your health care provider. Keep all follow-up visits as told by your health care provider. This is important. Contact a health care provider if: You have pain that gets worse or does not get better with medicine, especially pain when you urinate. You have difficulty urinating. You feel nauseous or you vomit  repeatedly during a period of more than 2 days after the procedure. Get help right away if: Your urine is dark red or has blood clots in it. You are leaking urine (have incontinence). The end of the stent comes out of your urethra. You cannot urinate. You have sudden, sharp, or severe pain in your abdomen or lower back. You have a fever. You have swelling or pain in your legs. You have difficulty breathing. Summary After the procedure, it is common to have mild pain when you urinate that goes away within a few minutes after you urinate. This may last for up to 1 week. Watch for any blood in your urine. Call your health care provider if the amount of blood in your urine increases. Take over-the-counter and prescription medicines only as told by your health care provider. Drink enough fluid to keep your urine pale yellow. This information is not intended to replace advice given to you by your health care provider. Make sure you discuss any questions you have with your health care provider. Document Revised: 06/28/2018 Document Reviewed: 06/29/2018 Elsevier Patient Education  2022 [place] Anesthesia, Adult, Care After This sheet gives you information about how to care for yourself after your procedure. Your health care provider may also give you more specific instructions. If you have problems or questions, contact your health care provider. What can I expect after the procedure? After the procedure, the following side effects are common: Pain or discomfort at the IV site. Nausea. Vomiting. Sore throat. Trouble concentrating. Feeling cold or chills. Feeling weak or tired. Sleepiness and fatigue. Soreness and body aches. These side effects can affect parts of the body that were not involved in surgery. Follow these instructions at home: For the time period you were told by your health care provider:  Rest. Do not participate in activities where you could fall or become  injured. Do not drive or use machinery. Do not drink alcohol. Do not take sleeping pills or medicines that cause drowsiness. Do not make important decisions or sign legal documents. Do not take care of children on your own. Eating and drinking Follow any instructions from your health care provider about eating or drinking restrictions. When you feel hungry, start by eating small amounts of foods that are soft and easy to digest (bland), such as toast. Gradually return to your regular diet. Drink enough fluid to keep your urine pale yellow. If you vomit, rehydrate by drinking water, juice, or clear broth. General instructions If you have sleep apnea, surgery and certain medicines can increase your risk for breathing problems. Follow instructions from your health care provider about wearing your sleep device: Anytime you are sleeping, including during daytime naps. While taking prescription pain medicines, sleeping medicines, or medicines that make you drowsy. Have a responsible adult stay with you for the time you are told.  It is important to have someone help care for you until you are awake and alert. Return to your normal activities as told by your health care provider. Ask your health care provider what activities are safe for you. Take over-the-counter and prescription medicines only as told by your health care provider. If you smoke, do not smoke without supervision. Keep all follow-up visits as told by your health care provider. This is important. Contact a health care provider if: You have nausea or vomiting that does not get better with medicine. You cannot eat or drink without vomiting. You have pain that does not get better with medicine. You are unable to pass urine. You develop a skin rash. You have a fever. You have redness around your IV site that gets worse. Get help right away if: You have difficulty breathing. You have chest pain. You have blood in your urine or stool,  or you vomit blood. Summary After the procedure, it is common to have a sore throat or nausea. It is also common to feel tired. Have a responsible adult stay with you for the time you are told. It is important to have someone help care for you until you are awake and alert. When you feel hungry, start by eating small amounts of foods that are soft and easy to digest (bland), such as toast. Gradually return to your regular diet. Drink enough fluid to keep your urine pale yellow. Return to your normal activities as told by your health care provider. Ask your health care provider what activities are safe for you. This information is not intended to replace advice given to you by your health care provider. Make sure you discuss any questions you have with your health care provider. Document Revised: 06/06/2020 Document Reviewed: 01/04/2020 Elsevier Patient Education  2022 [place]. How to Use Chlorhexidine for Bathing Chlorhexidine gluconate (CHG) is a germ-killing (antiseptic) solution that is used to clean the skin. It can get rid of the bacteria that normally live on the skin and can keep them away for about 24 hours. To clean your skin with CHG, you may be given: A CHG solution to use in the shower or as part of a sponge bath. A prepackaged cloth that contains CHG. Cleaning your skin with CHG may help lower the risk for infection: While you are staying in the intensive care unit of the hospital. If you have a vascular access, such as a central line, to provide short-term or long-term access to your veins. If you have a catheter to drain urine from your bladder. If you are on a ventilator. A ventilator is a machine that helps you breathe by moving air in and out of your lungs. After surgery. What are the risks? Risks of using CHG include: A skin reaction. Hearing loss, if CHG gets in your ears and you have a perforated eardrum. Eye injury, if CHG gets in your eyes and is not rinsed  out. The CHG product catching fire. Make sure that you avoid smoking and flames after applying CHG to your skin. Do not use CHG: If you have a chlorhexidine allergy or have previously reacted to chlorhexidine. On babies younger than 25 months of age. How to use CHG solution Use CHG only as told by your health care provider, and follow the instructions on the label. Use the full amount of CHG as directed. Usually, this is one bottle. During a shower Follow these steps when using CHG solution during a shower (unless your  health care provider gives you different instructions): Start the shower. Use your normal soap and shampoo to wash your face and hair. Turn off the shower or move out of the shower stream. Pour the CHG onto a clean washcloth. Do not use any type of brush or rough-edged sponge. Starting at your neck, lather your body down to your toes. Make sure you follow these instructions: If you will be having surgery, pay special attention to the part of your body where you will be having surgery. Scrub this area for at least 1 minute. Do not use CHG on your head or face. If the solution gets into your ears or eyes, rinse them well with water. Avoid your genital area. Avoid any areas of skin that have broken skin, cuts, or scrapes. Scrub your back and under your arms. Make sure to wash skin folds. Let the lather sit on your skin for 1-2 minutes or as long as told by your health care provider. Thoroughly rinse your entire body in the shower. Make sure that all body creases and crevices are rinsed well. Dry off with a clean towel. Do not put any substances on your body afterward--such as powder, lotion, or perfume--unless you are told to do so by your health care provider. Only use lotions that are recommended by the manufacturer. Put on clean clothes or pajamas. If it is the night before your surgery, sleep in clean sheets.  During a sponge bath Follow these steps when using CHG solution  during a sponge bath (unless your health care provider gives you different instructions): Use your normal soap and shampoo to wash your face and hair. Pour the CHG onto a clean washcloth. Starting at your neck, lather your body down to your toes. Make sure you follow these instructions: If you will be having surgery, pay special attention to the part of your body where you will be having surgery. Scrub this area for at least 1 minute. Do not use CHG on your head or face. If the solution gets into your ears or eyes, rinse them well with water. Avoid your genital area. Avoid any areas of skin that have broken skin, cuts, or scrapes. Scrub your back and under your arms. Make sure to wash skin folds. Let the lather sit on your skin for 1-2 minutes or as long as told by your health care provider. Using a different clean, wet washcloth, thoroughly rinse your entire body. Make sure that all body creases and crevices are rinsed well. Dry off with a clean towel. Do not put any substances on your body afterward--such as powder, lotion, or perfume--unless you are told to do so by your health care provider. Only use lotions that are recommended by the manufacturer. Put on clean clothes or pajamas. If it is the night before your surgery, sleep in clean sheets. How to use CHG prepackaged cloths Only use CHG cloths as told by your health care provider, and follow the instructions on the label. Use the CHG cloth on clean, dry skin. Do not use the CHG cloth on your head or face unless your health care provider tells you to. When washing with the CHG cloth: Avoid your genital area. Avoid any areas of skin that have broken skin, cuts, or scrapes. Before surgery Follow these steps when using a CHG cloth to clean before surgery (unless your health care provider gives you different instructions): Using the CHG cloth, vigorously scrub the part of your body where you will be having  surgery. Scrub using a  back-and-forth motion for 3 minutes. The area on your body should be completely wet with CHG when you are done scrubbing. Do not rinse. Discard the cloth and let the area air-dry. Do not put any substances on the area afterward, such as powder, lotion, or perfume. Put on clean clothes or pajamas. If it is the night before your surgery, sleep in clean sheets.  For general bathing Follow these steps when using CHG cloths for general bathing (unless your health care provider gives you different instructions). Use a separate CHG cloth for each area of your body. Make sure you wash between any folds of skin and between your fingers and toes. Wash your body in the following order, switching to a new cloth after each step: The front of your neck, shoulders, and chest. Both of your arms, under your arms, and your hands. Your stomach and groin area, avoiding the genitals. Your right leg and foot. Your left leg and foot. The back of your neck, your back, and your buttocks. Do not rinse. Discard the cloth and let the area air-dry. Do not put any substances on your body afterward--such as powder, lotion, or perfume--unless you are told to do so by your health care provider. Only use lotions that are recommended by the manufacturer. Put on clean clothes or pajamas. Contact a health care provider if: Your skin gets irritated after scrubbing. You have questions about using your solution or cloth. You swallow any chlorhexidine. Call your local poison control center (1-(416)810-3266 in the [place] Get help right away if: Your eyes itch badly, or they become very red or swollen. Your skin itches badly and is red or swollen. Your hearing changes. You have trouble seeing. You have swelling or tingling in your mouth or throat. You have trouble breathing. These symptoms may represent a serious problem that is an emergency. Do not wait to see if the symptoms will go away. Get medical help right away. Call your  local emergency services (911 in the [place] Do not drive yourself to the hospital. Summary Chlorhexidine gluconate (CHG) is a germ-killing (antiseptic) solution that is used to clean the skin. Cleaning your skin with CHG may help to lower your risk for infection. You may be given CHG to use for bathing. It may be in a bottle or in a prepackaged cloth to use on your skin. Carefully follow your health care provider's instructions and the instructions on the product label. Do not use CHG if you have a chlorhexidine allergy. Contact your health care provider if your skin gets irritated after scrubbing. This information is not intended to replace advice given to you by your health care provider. Make sure you discuss any questions you have with your health care provider. Document Revised: 12/02/2020 Document Reviewed: 12/02/2020 Elsevier Patient Education  2022 [employer].

## 2021-09-01 NOTE — Telephone Encounter (Signed)
Inbound call from patient. States she was recently in the hospital and would like to speak with Dr. Bryan Lemma about the visit. I also offered patient an OV and she declined.

## 2021-09-01 NOTE — Telephone Encounter (Signed)
I spoke with Norma Colon today.  She has another Urology surgery scheduled for later this week.  She understands the reason for needing an office visit prior to scheduling a repeat colonoscopy due to her recent complex medical issues.  She will plan to call the office after her upcoming surgery in order to schedule OV with me.  All questions answered to the best of my ability.

## 2021-09-02 ENCOUNTER — Encounter: Payer: Self-pay | Admitting: Oncology

## 2021-09-02 ENCOUNTER — Encounter (HOSPITAL_COMMUNITY)
Admit: 2021-09-02 | Discharge: 2021-09-02 | Disposition: A | Discharge status: Home / Self Care | Payer: 59 | Attending: Urology | Admitting: Urology

## 2021-09-02 ENCOUNTER — Other Ambulatory Visit: Payer: Self-pay

## 2021-09-02 NOTE — Telephone Encounter (Signed)
Noted, thanks!

## 2021-09-03 ENCOUNTER — Telehealth: Payer: 59

## 2021-09-04 ENCOUNTER — Ambulatory Visit (HOSPITAL_COMMUNITY): Payer: 59 | Admitting: Anesthesiology

## 2021-09-04 ENCOUNTER — Other Ambulatory Visit: Payer: Self-pay

## 2021-09-04 ENCOUNTER — Ambulatory Visit (HOSPITAL_COMMUNITY): Payer: 59

## 2021-09-04 ENCOUNTER — Encounter (HOSPITAL_COMMUNITY)
Admission: RE | Disposition: A | Discharge status: Home / Self Care | Payer: Self-pay | Source: Home / Self Care | Attending: Urology

## 2021-09-04 ENCOUNTER — Encounter (HOSPITAL_COMMUNITY): Payer: Self-pay | Admitting: Urology

## 2021-09-04 ENCOUNTER — Ambulatory Visit (HOSPITAL_COMMUNITY)
Admission: RE | Admit: 2021-09-04 | Discharge: 2021-09-04 | Disposition: A | Discharge status: Home / Self Care | Payer: 59 | Attending: Urology | Admitting: Urology

## 2021-09-04 DIAGNOSIS — S3710XA Unspecified injury of ureter, initial encounter: Secondary | ICD-10-CM | POA: Diagnosis not present

## 2021-09-04 DIAGNOSIS — Z87891 Personal history of nicotine dependence: Secondary | ICD-10-CM | POA: Insufficient documentation

## 2021-09-04 DIAGNOSIS — Z466 Encounter for fitting and adjustment of urinary device: Secondary | ICD-10-CM | POA: Insufficient documentation

## 2021-09-04 DIAGNOSIS — D649 Anemia, unspecified: Secondary | ICD-10-CM | POA: Diagnosis not present

## 2021-09-04 DIAGNOSIS — N3592 Unspecified urethral stricture, female: Secondary | ICD-10-CM | POA: Diagnosis not present

## 2021-09-04 HISTORY — PX: CYSTOSCOPY W/ URETERAL STENT PLACEMENT: SHX1429

## 2021-09-04 SURGERY — CYSTOSCOPY, WITH RETROGRADE PYELOGRAM AND URETERAL STENT INSERTION
Anesthesia: General | Laterality: Right

## 2021-09-04 MED ORDER — SODIUM CHLORIDE 0.9 % IR SOLN
Status: DC | PRN
  Administered 2021-09-04 (×2): 3000 mL

## 2021-09-04 MED ORDER — MIDAZOLAM HCL 5 MG/5ML IJ SOLN
INTRAMUSCULAR | Status: DC | PRN
  Administered 2021-09-04: 2 mg via INTRAVENOUS

## 2021-09-04 MED ORDER — PROPOFOL 10 MG/ML IV BOLUS
INTRAVENOUS | Status: AC
  Filled 2021-09-04: qty 20

## 2021-09-04 MED ORDER — FENTANYL CITRATE PF 50 MCG/ML IJ SOSY: 25.0000 ug | PREFILLED_SYRINGE | INTRAMUSCULAR | Status: DC | PRN

## 2021-09-04 MED ORDER — HYDROCODONE-ACETAMINOPHEN 10-325 MG PO TABS: 1.0000 | ORAL_TABLET | Freq: Four times a day (QID) | ORAL | 0 refills | Status: DC | PRN

## 2021-09-04 MED ORDER — PROPOFOL 10 MG/ML IV BOLUS
INTRAVENOUS | Status: DC | PRN
  Administered 2021-09-04: 160 mg via INTRAVENOUS

## 2021-09-04 MED ORDER — DIATRIZOATE MEGLUMINE 30 % UR SOLN
URETHRAL | Status: AC
  Filled 2021-09-04: qty 100

## 2021-09-04 MED ORDER — ONDANSETRON HCL 4 MG/2ML IJ SOLN: 4.0000 mg | Freq: Once | INTRAMUSCULAR | Status: DC | PRN

## 2021-09-04 MED ORDER — MIDAZOLAM HCL 2 MG/2ML IJ SOLN
INTRAMUSCULAR | Status: AC
  Filled 2021-09-04: qty 2

## 2021-09-04 MED ORDER — SULFAMETHOXAZOLE-TRIMETHOPRIM 800-160 MG PO TABS: 1.0000 | ORAL_TABLET | Freq: Two times a day (BID) | ORAL | 0 refills | Status: DC

## 2021-09-04 MED ORDER — ORAL CARE MOUTH RINSE: 15.0000 mL | Freq: Once | OROMUCOSAL | Status: AC

## 2021-09-04 MED ORDER — CEFAZOLIN SODIUM-DEXTROSE 2-4 GM/100ML-% IV SOLN
2.0000 g | INTRAVENOUS | Status: AC
  Administered 2021-09-04: 2 g via INTRAVENOUS
  Filled 2021-09-04: qty 100

## 2021-09-04 MED ORDER — LACTATED RINGERS IV SOLN: INTRAVENOUS | Status: DC

## 2021-09-04 MED ORDER — CHLORHEXIDINE GLUCONATE 0.12 % MT SOLN
15.0000 mL | Freq: Once | OROMUCOSAL | Status: AC
  Administered 2021-09-04: 15 mL via OROMUCOSAL

## 2021-09-04 MED ORDER — ONDANSETRON HCL 4 MG/2ML IJ SOLN
INTRAMUSCULAR | Status: AC
  Filled 2021-09-04: qty 2

## 2021-09-04 MED ORDER — CHLORHEXIDINE GLUCONATE 0.12 % MT SOLN
OROMUCOSAL | Status: AC
  Filled 2021-09-04: qty 15

## 2021-09-04 MED ORDER — FENTANYL CITRATE (PF) 100 MCG/2ML IJ SOLN
INTRAMUSCULAR | Status: AC
  Filled 2021-09-04: qty 2

## 2021-09-04 MED ORDER — DIATRIZOATE MEGLUMINE 30 % UR SOLN
URETHRAL | Status: DC | PRN
  Administered 2021-09-04: 20 mL via URETHRAL

## 2021-09-04 MED ORDER — LIDOCAINE HCL (CARDIAC) PF 100 MG/5ML IV SOSY
PREFILLED_SYRINGE | INTRAVENOUS | Status: DC | PRN
  Administered 2021-09-04: 50 mg via INTRAVENOUS

## 2021-09-04 MED ORDER — STERILE WATER FOR IRRIGATION IR SOLN
Status: DC | PRN
  Administered 2021-09-04: 500 mL

## 2021-09-04 MED ORDER — FENTANYL CITRATE (PF) 100 MCG/2ML IJ SOLN
INTRAMUSCULAR | Status: DC | PRN
  Administered 2021-09-04: 100 ug via INTRAVENOUS

## 2021-09-04 SURGICAL SUPPLY — 20 items
BAG DRAIN URO TABLE W/ADPT NS (BAG) ×2 IMPLANT
BAG DRN 8 ADPR NS SKTRN CSTL (BAG) ×1
BAG HAMPER (MISCELLANEOUS) ×2 IMPLANT
CATH INTERMIT  6FR 70CM (CATHETERS) ×2 IMPLANT
CLOTH BEACON ORANGE TIMEOUT ST (SAFETY) ×2 IMPLANT
DECANTER SPIKE VIAL GLASS SM (MISCELLANEOUS) ×2 IMPLANT
GLOVE SURG POLYISO LF SZ8 (GLOVE) ×2 IMPLANT
GLOVE SURG UNDER POLY LF SZ7 (GLOVE) ×4 IMPLANT
GOWN STRL REUS W/TWL LRG LVL3 (GOWN DISPOSABLE) ×2 IMPLANT
GOWN STRL REUS W/TWL XL LVL3 (GOWN DISPOSABLE) ×2 IMPLANT
GUIDEWIRE STR ZIPWIRE 035X150 (MISCELLANEOUS) ×2 IMPLANT
IV NS IRRIG 3000ML ARTHROMATIC (IV SOLUTION) ×2 IMPLANT
KIT TURNOVER CYSTO (KITS) ×2 IMPLANT
MANIFOLD NEPTUNE II (INSTRUMENTS) ×2 IMPLANT
PACK CYSTO (CUSTOM PROCEDURE TRAY) ×2 IMPLANT
PAD ARMBOARD 7.5X6 YLW CONV (MISCELLANEOUS) ×2 IMPLANT
STENT URET 6FRX26 CONTOUR (STENTS) IMPLANT
SYR 10ML LL (SYRINGE) ×2 IMPLANT
TOWEL OR 17X26 4PK STRL BLUE (TOWEL DISPOSABLE) ×2 IMPLANT
WATER STERILE IRR 500ML POUR (IV SOLUTION) ×2 IMPLANT

## 2021-09-04 NOTE — Anesthesia Postprocedure Evaluation (Signed)
Anesthesia Post Note  Patient: Norma Colon  Procedure(s) Performed: CYSTOSCOPY WITH RETROGRADE PYELOGRAM WITH STENT REMOVAL (Right)  Patient location during evaluation: Phase II Anesthesia Type: General Level of consciousness: awake Pain management: pain level controlled Vital Signs Assessment: post-procedure vital signs reviewed and stable Respiratory status: spontaneous breathing and respiratory function stable Cardiovascular status: blood pressure returned to baseline and stable Postop Assessment: no headache and no apparent nausea or vomiting Anesthetic complications: no Comments: Late entry   No notable events documented.   Last Vitals:  Vitals:   09/04/21 0900 09/04/21 0912  BP: 93/74 114/77  Pulse: 93 86  Resp: 18 20  Temp:  36.7 C  SpO2: 97% 99%    Last Pain:  Vitals:   09/04/21 0912  TempSrc: Oral  PainSc: 0-No pain                 Louann Sjogren

## 2021-09-04 NOTE — Anesthesia Procedure Notes (Signed)
Procedure Name: LMA Insertion Date/Time: 09/04/2021 7:57 AM Performed by: Jonna Munro, CRNA Pre-anesthesia Checklist: Patient identified, Emergency Drugs available, Suction available, Patient being monitored and Timeout performed Patient Re-evaluated:Patient Re-evaluated prior to induction Oxygen Delivery Method: Circle system utilized Preoxygenation: Pre-oxygenation with 100% oxygen Induction Type: IV induction LMA: LMA inserted LMA Size: 4.0 Number of attempts: 1 Placement Confirmation: breath sounds checked- equal and bilateral and positive ETCO2 Tube secured with: Tape Dental Injury: Teeth and Oropharynx as per pre-operative assessment

## 2021-09-04 NOTE — Transfer of Care (Signed)
Immediate Anesthesia Transfer of Care Note  Patient: Norma Colon  Procedure(s) Performed: CYSTOSCOPY WITH RETROGRADE PYELOGRAM WITH STENT REMOVAL (Right)  Patient Location: PACU  Anesthesia Type:General  Level of Consciousness: awake, alert , oriented and patient cooperative  Airway & Oxygen Therapy: Patient Spontanous Breathing  Post-op Assessment: Report given to RN, Post -op Vital signs reviewed and stable and Patient moving all extremities X 4  Post vital signs: Reviewed and stable  Last Vitals:  Vitals Value Taken Time  BP 98/53 09/04/21 0832  Temp    Pulse 92 09/04/21 0833  Resp 13 09/04/21 0833  SpO2 93 % 09/04/21 0833  Vitals shown include unvalidated device data.  Last Pain:  Vitals:   09/04/21 0715  TempSrc: Oral  PainSc: 0-No pain         Complications: No notable events documented.

## 2021-09-04 NOTE — Interval H&P Note (Signed)
History and Physical Interval Note:  09/04/2021 7:35 AM  Norma Colon  has presented today for surgery, with the diagnosis of right ureteral stricture.  The various methods of treatment have been discussed with the patient and family. After consideration of risks, benefits and other options for treatment, the patient has consented to  Procedure(s): CYSTOSCOPY WITH RETROGRADE PYELOGRAM/URETERAL STENT EXCHANGE (Right) as a surgical intervention.  The patient's history has been reviewed, patient examined, no change in status, stable for surgery.  I have reviewed the patient's chart and labs.  Questions were answered to the patient's satisfaction.     Nicolette Bang

## 2021-09-04 NOTE — Anesthesia Preprocedure Evaluation (Signed)
Anesthesia Evaluation  Patient identified by MRN, date of birth, ID band Patient awake    Reviewed: Allergy & Precautions, H&P , NPO status , Patient's Chart, lab work & pertinent test results, reviewed documented beta blocker date and time   Airway Mallampati: II  TM Distance: >3 FB Neck ROM: full    Dental no notable dental hx.    Pulmonary neg pulmonary ROS, former smoker,    Pulmonary exam normal breath sounds clear to auscultation       Cardiovascular Exercise Tolerance: Good negative cardio ROS   Rhythm:regular Rate:Normal     Neuro/Psych  Headaches, PSYCHIATRIC DISORDERS Anxiety Depression    GI/Hepatic negative GI ROS, Neg liver ROS,   Endo/Other  negative endocrine ROS  Renal/GU Renal disease  negative genitourinary   Musculoskeletal   Abdominal   Peds  Hematology  (+) Blood dyscrasia, anemia ,   Anesthesia Other Findings   Reproductive/Obstetrics negative OB ROS                             Anesthesia Physical Anesthesia Plan  ASA: 2  Anesthesia Plan: General and General LMA   Post-op Pain Management:    Induction:   PONV Risk Score and Plan: Ondansetron  Airway Management Planned:   Additional Equipment:   Intra-op Plan:   Post-operative Plan:   Informed Consent: I have reviewed the patients History and Physical, chart, labs and discussed the procedure including the risks, benefits and alternatives for the proposed anesthesia with the patient or authorized representative who has indicated his/her understanding and acceptance.     Dental Advisory Given  Plan Discussed with: CRNA  Anesthesia Plan Comments:         Anesthesia Quick Evaluation

## 2021-09-04 NOTE — Op Note (Signed)
Preoperative diagnosis: Right ureteral injury  Postoperative diagnosis: Normal right ureter  Procedure: 1 cystoscopy 2.  right retrograde pyelography 3.  Intraoperative fluoroscopy, under one hour, with interpretation 4.  Right 6 x 26 JJ stent removal  Attending: Rosie Fate  Anesthesia: General  Estimated blood loss: None  Drains: none  Specimens: none  Antibiotics: ancef  Findings: right ureteral orifice at dome due to previously ureteral reimplant. No right hydronephrosis and no contrast extravasation. Right ureteral stent removed.  Indications: Patient is a 60 year old female with a history of a right ureteral injury who underwent right ureteral stent placement over 6 weeks ago.  After discussing treatment options, she decided proceed with right retrograde pyelogram with possible ureteral stent removal or exchange.  Procedure in detail: The patient was brought to the operating room and a brief timeout was done to ensure correct patient, correct procedure, correct site.  General anesthesia was administered patient was placed in dorsal lithotomy position.  Her genitalia was then prepped and draped in usual sterile fashion.  A rigid 42 French cystoscope was passed in the urethra and the bladder.  Bladder was inspected free masses or lesions.  the right ureteral orifice was located at the right posterior dome.  a 6 french ureteral catheter was then instilled into the right ureter orifice.  a gentle retrograde was obtained and findings noted above.  We noted to contrast extravasation. Using a grasper the right ureteral stent was removed. We then obtained another retrograde pyelogram which was normal.  the bladder was then drained and this concluded the procedure which was well tolerated by patient.  Complications: None  Condition: Stable, extubated, transferred to PACU  Plan: Patient is to be discharged home as to follow-up in one week

## 2021-09-05 ENCOUNTER — Encounter (HOSPITAL_COMMUNITY): Payer: Self-pay | Admitting: Urology

## 2021-09-05 NOTE — Telephone Encounter (Signed)
Please review recent hospital notes per patient request.

## 2021-09-10 ENCOUNTER — Other Ambulatory Visit: Payer: Self-pay

## 2021-09-10 ENCOUNTER — Ambulatory Visit (INDEPENDENT_AMBULATORY_CARE_PROVIDER_SITE_OTHER): Payer: 59 | Admitting: Urology

## 2021-09-10 ENCOUNTER — Encounter: Payer: Self-pay | Admitting: Urology

## 2021-09-10 VITALS — BP 137/82 | HR 102

## 2021-09-10 DIAGNOSIS — N135 Crossing vessel and stricture of ureter without hydronephrosis: Secondary | ICD-10-CM | POA: Diagnosis not present

## 2021-09-10 NOTE — Progress Notes (Signed)
09/10/2021 9:37 AM   Benjamine Sprague 08-Mar-1961 [id number]  Referring provider: Dian Queen, [address] [place],  [place] [id number]   Followup right hydronephrosis/right ureteral injury   HPI: Ms Norma Colon is a 60yo here for followup after right ureteral stent removal. Retrograde showed no contrast extravasation and no hydronephrosis. She has mild right flank pain. No significant LUTS.  No other complaints today. No hematuria.    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-- dr Benay Spice--  per last note in epic ,  clinical remission   dx 08/ 2019---- chemoradiation concurrent completed 07-20-2018;  s/p  low anterior resection 09-16-2018;   chemo 10-17-2018  ot 12-19-2018   Anxiety    Arthritis    Chemotherapy induced neutropenia ([place])    Depression    Headache    migraines    History of HPV infection    History of kidney stones    Hydronephrosis, right    Restless leg syndrome    Scoliosis    Sepsis ([place])    11-2018    Surgical History: Past Surgical History:  Procedure Laterality Date   COLONOSCOPY     COLPOSCOPY  04/04/2018   CYSTOSCOPY W/ RETROGRADES Right 07/31/2021   Procedure: CYSTOSCOPY WITH RETROGRADE PYELOGRAM, URETEROSCOPY AND STENT PLACEMENT;  Surgeon: Janith Lima, MD;  Location: [place];  Service: Urology;  Laterality: Right;   CYSTOSCOPY W/ URETERAL STENT PLACEMENT Right 09/04/2021   Procedure: CYSTOSCOPY WITH RETROGRADE PYELOGRAM WITH STENT REMOVAL;  Surgeon: Cleon Gustin, MD;  Location: [facility];  Service: Urology;  Laterality: Right;   CYSTOSCOPY WITH RETROGRADE PYELOGRAM, URETEROSCOPY AND STENT PLACEMENT Right 12/12/2018   Procedure: CYSTOSCOPY WITH RETROGRADE PYELOGRAM, URETEROSCOPY, STONE EXTRACTION AND STENT PLACEMENT;  Surgeon: Cleon Gustin, MD;  Location: [facility];  Service: Urology;  Laterality: Right;   CYSTOSCOPY WITH RETROGRADE PYELOGRAM,  URETEROSCOPY AND STENT PLACEMENT Right 04/17/2019   Procedure: CYSTOSCOPY WITH RETROGRADE PYELOGRAM, BALLOON [place]  URETEROSCOPY AND STENT PLACEMENT;  Surgeon: Cleon Gustin, MD;  Location: [facility];  Service: Urology;  Laterality: Right;   CYSTOSCOPY WITH STENT PLACEMENT Right 11/21/2018   Procedure: CYSTOSCOPY, RIGHT RETROGRADE PYELOGRAM, WITH RIGHT URETERAL STENT PLACEMENT;  Surgeon: Cleon Gustin, MD;  Location: [facility];  Service: Urology;  Laterality: Right;   IR IMAGING GUIDED PORT INSERTION  09/04/2020   IR NEPHROSTOMY PLACEMENT RIGHT  06/22/2019   IR PATIENT EVAL TECH 0-60 MINS  06/28/2019   IR RADIOLOGIST EVAL & MGMT  06/06/2020   IR RADIOLOGIST EVAL & MGMT  07/17/2020   IR RADIOLOGIST EVAL & MGMT  08/15/2020   IR RADIOLOGIST EVAL & MGMT  09/03/2020   IR RADIOLOGIST EVAL & MGMT  12/18/2020   IR RADIOLOGIST EVAL & MGMT  02/25/2021   IR RADIOLOGIST EVAL & MGMT  06/23/2021   IR RADIOLOGIST EVAL & MGMT  08/12/2021   left forearm surgery due to calcium rock      RADIOLOGY WITH ANESTHESIA N/A 06/19/2020   Procedure: CT WITH ANESTHESIA MICROWAVE ABLATION;  Surgeon: Jacqulynn Cadet, MD;  Location: [facility];  Service: Anesthesiology;  Laterality: N/A;   RADIOLOGY WITH ANESTHESIA N/A 07/30/2021   Procedure: CT MICROWAVE ABLATION OF THE LIVER;  Surgeon: Criselda Peaches, MD;  Location[facility];  Service: Radiology;  Laterality: N/A;   ROBOT ASSISTED PYELOPLASTY Right 08/03/2019   Procedure: XI ROBOTIC ASSISTED PYELOPLASTY WITH STENT PLACEMENT;  Surgeon: Cleon Gustin,  MD;  Location: [facility];  Service: Urology;  Laterality: Right;  3 HRS   SHOULDER ARTHROSCOPY W/ ROTATOR CUFF REPAIR Right    XI ROBOTIC ASSISTED LOWER ANTERIOR RESECTION N/A 09/16/2018   Procedure: XI ROBOTIC ASSISTED LOWER ANTERIOR RESECTION ERAS PATHWAY;  Surgeon: Leighton Ruff, MD;  Location[facility];  Service: General;  Laterality: N/A;    Home Medications:  Allergies as  of 09/10/2021       Reactions   Prednisone Other (See Comments)   "Felt like she was crazy" if on it more than 1 week   Codeine Other (See Comments)   Causes migraine--patient requests this be kept on allergy list        Medication List        Accurate as of September 10, 2021  9:37 AM. If you have any questions, ask your nurse or doctor.          STOP taking these medications    lidocaine-prilocaine cream Commonly known as: EMLA Stopped by: Nicolette Bang, MD   Na Sulfate-K Sulfate-Mg Sulf 17.5-3.13-1.6 GM/177ML Soln Stopped by: Nicolette Bang, MD       TAKE these medications    alprazolam 2 MG tablet Commonly known as: XANAX Take 2 mg by mouth every morning.   buPROPion 300 MG 24 hr tablet Commonly known as: WELLBUTRIN XL Take 300 mg by mouth every morning.   clotrimazole-betamethasone cream Commonly known as: Lotrisone Apply 1 application topically 2 (two) times daily.   cyclobenzaprine 10 MG tablet Commonly known as: FLEXERIL Take 20 mg by mouth at bedtime.   diphenoxylate-atropine 2.5-0.025 MG tablet Commonly known as: LOMOTIL TAKE 1 TO 2 TABLETS BY MOUTH FOUR TIMES DAILY AS NEEDED FOR DIARRHEA OR LOOSE STOOLS   HYDROcodone-acetaminophen 10-325 MG tablet Commonly known as: NORCO Take 1-2 tablets by mouth every 6 (six) hours as needed for moderate pain or severe pain (headache). Post-Operatively.   lubiprostone 24 MCG capsule Commonly known as: AMITIZA Take 1 capsule (24 mcg total) by mouth 2 (two) times daily with a meal.   oxybutynin 5 MG tablet Commonly known as: DITROPAN Take 5 mg by mouth 3 (three) times daily.   rOPINIRole 1 MG tablet Commonly known as: REQUIP Take 1 mg by mouth at bedtime.   sulfamethoxazole-trimethoprim 800-160 MG tablet Commonly known as: BACTRIM DS Take 1 tablet by mouth 2 (two) times daily.        Allergies:  Allergies  Allergen Reactions   Prednisone Other (See Comments)    "Felt like she was crazy"  if on it more than 1 week   Codeine Other (See Comments)    Causes migraine--patient requests this be kept on allergy list    Family History: Family History  Problem Relation Age of Onset   COPD Father        smoker   Stomach cancer Father        spread to lungs and bones   Arthritis Mother    Heart attack Paternal Grandmother    Heart attack Paternal Grandfather    Heart attack Maternal Grandmother    Stroke Maternal Grandmother    Colon cancer Neg Hx    Rectal cancer Neg Hx    Esophageal cancer Neg Hx     Social History:  reports that she quit smoking about 2 years ago. Her smoking use included cigarettes. She has a 30.00 pack-year smoking history. She has never used smokeless tobacco. She reports current alcohol use. She reports that she does not  use drugs.  ROS: All other review of systems were reviewed and are negative except what is noted above in HPI  Physical Exam: BP 137/82   Pulse (!) 102   LMP  (LMP Unknown)   Constitutional:  Alert and oriented, No acute distress. HEENT: Elim AT, moist mucus membranes.  Trachea midline, no masses. Cardiovascular: No clubbing, cyanosis, or edema. Respiratory: Normal respiratory effort, no increased work of breathing. GI: Abdomen is soft, nontender, nondistended, no abdominal masses GU: No CVA tenderness.  Lymph: No cervical or inguinal lymphadenopathy. Skin: No rashes, bruises or suspicious lesions. Neurologic: Grossly intact, no focal deficits, moving all 4 extremities. Psychiatric: Normal mood and affect.  Laboratory Data: Lab Results  Component Value Date   WBC 4.8 08/29/2021   HGB 10.7 (L) 08/29/2021   HCT 33.1 (L) 08/29/2021   MCV 93.5 08/29/2021   PLT 233 08/29/2021    Lab Results  Component Value Date   CREATININE 1.01 (H) 08/29/2021    No results found for: PSA  No results found for: TESTOSTERONE  No results found for: HGBA1C  Urinalysis    Component Value Date/Time   COLORURINE YELLOW 08/24/2021 2017    APPEARANCEUR CLEAR 08/24/2021 2017   LABSPEC 1.025 08/24/2021 2017   PHURINE 6.0 08/24/2021 2017   GLUCOSEU NEGATIVE 08/24/2021 2017   HGBUR LARGE (A) 08/24/2021 2017   BILIRUBINUR NEGATIVE 08/24/2021 2017   KETONESUR NEGATIVE 08/24/2021 2017   PROTEINUR >300 (A) 08/24/2021 2017   NITRITE NEGATIVE 08/24/2021 2017   LEUKOCYTESUR MODERATE (A) 08/24/2021 2017    Lab Results  Component Value Date   BACTERIA FEW (A) 08/24/2021    Pertinent Imaging:  Results for orders placed during the hospital encounter of 01/02/20  DG Abd 1 View  Narrative CLINICAL DATA:  Cyst marker capsules swallowed 12/25/2019  EXAM: ABDOMEN - 1 VIEW  COMPARISON:  None.  FINDINGS: Capsule markers overlie the descending colon and sigmoid colon. Bowel gas pattern is unremarkable.  IMPRESSION: Capsule markers likely within descending and sigmoid colon.   Electronically Signed By: Macy Mis M.D. On: 01/02/2020 15:52  No results found for this or any previous visit.  No results found for this or any previous visit.  No results found for this or any previous visit.  Results for orders placed during the hospital encounter of 07/30/21  US RENAL  Narrative CLINICAL DATA:  Right hydronephrosis  EXAM: RENAL / URINARY TRACT ULTRASOUND COMPLETE  COMPARISON:  None.  FINDINGS: Right Kidney:  Renal measurements: 9.8 x 5.6 x 6.0 cm = volume: 172 mL. Echogenicity within normal limits. No mass or hydronephrosis visualized.  Left Kidney:  Renal measurements: 9.9 x 5.0 x 4.7 cm = volume: 121 mL. Echogenicity within normal limits. No mass or hydronephrosis visualized.  Bladder:  Appears normal for degree of bladder distention.  Other:  Small amount of free fluid seen in the left flank.  IMPRESSION: No hydronephrosis.  Small amount of free fluid seen in the left flank.   Electronically Signed By: Yetta Glassman M.D. On: 07/31/2021 10:01  No results found for this or any  previous visit.  No results found for this or any previous visit.  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 Unenhanced CT was performed per clinician order. Lack of IV contrast limits sensitivity and specificity, especially for evaluation of abdominal/pelvic solid viscera.  COMPARISON:  08/21/2021  FINDINGS: Lower chest: Subsegmental atelectasis within the lower lobes. No acute pleural or parenchymal lung disease.  Hepatobiliary: 4.3 x 3.6 cm hypodensity within the inferior aspect right lobe liver again identified, consistent with the site of prior ablation. Previously this had measured 4.8 x 4.3 cm. Remainder of the liver is unremarkable. High attenuation material within the gallbladder consistent with vicarious excretion of previously administered contrast. Cholelithiasis is again noted without evidence of cholecystitis.  Pancreas: Unremarkable. No pancreatic ductal dilatation or surrounding inflammatory changes.  Spleen: Normal in size without focal abnormality.  Adrenals/Urinary Tract: A right ureteral stent is again identified extending from the right renal pelvis into the bladder lumen. No change in the right Peri ureteral fat stranding seen previously. 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  The left kidney is unremarkable.  No urinary tract calculi.  Bladder is decompressed, limiting its evaluation. The adrenals are unremarkable.  Stomach/Bowel: No bowel obstruction or ileus. Normal appendix right lower quadrant. No bowel wall thickening or  inflammatory change.  Vascular/Lymphatic: No significant vascular findings are present. No enlarged abdominal or pelvic lymph nodes.  Reproductive: Uterus and bilateral adnexa are unremarkable.  Other: No free fluid or free gas.  No abdominal wall hernia.  Musculoskeletal: No acute or destructive bony lesions. There is grade 2 anterior spondylolisthesis of L5 on S1 unchanged. Reconstructed images demonstrate no additional findings.  IMPRESSION: 1. Stable position of the right ureteral stent. No change in the right Peri ureteral fat stranding seen previously. 2. Decreased size of the right lobe liver hypodensity at the site of prior microwave ablation. 3. Cholelithiasis without acute cholecystitis.   Electronically Signed By: Randa Ngo M.D. On: 08/24/2021 21:51   Assessment & Plan:    1. Ureter, stricture -She is doing well after stent removal. I will see her back in 4-6 weeks with a renal US   No follow-ups on file.  Nicolette Bang, MD  [facility] [place]

## 2021-09-10 NOTE — Patient Instructions (Signed)
Hydronephrosis ?Hydronephrosis is the swelling of one or both kidneys due to a blockage that stops urine from flowing out of the body. Kidneys filter waste from the blood and produce urine. This condition can lead to kidney failure and may become life-threatening if not treated promptly. ?What are the causes? ?In infants and children, common causes include problems that occur when a baby is developing in the womb. These can include problems in the kidneys or in the tubes that drain urine into the bladder (ureters). ?In adults, common causes include: ?Kidney stones. ?Pregnancy. ?A tumor or cyst in the abdomen or pelvis. ?An enlarged prostate gland. ?Other causes include: ?Bladder infection. ?Scar tissue from a previous surgery or injury. ?A blood clot. ?Cancer of the prostate, bladder, uterus, ovary, or colon. ?What are the signs or symptoms? ?Symptoms of this condition include: ?Pain or discomfort in your side (flank) or abdomen. ?Swelling in your abdomen. ?Nausea and vomiting. ?Fever. ?Pain when passing urine. ?Feelings of urgency when you need to urinate. ?Urinating more often than normal. ?In some cases, you may not have any symptoms. ?How is this diagnosed? ?This condition may be diagnosed based on: ?Your symptoms and medical history. ?A physical exam. ?Blood and urine tests. ?Imaging tests, such as an ultrasound, CT scan, or MRI. ?A procedure to look at your urinary tract and bladder by inserting a scope into the urethra (cystoscopy). ?How is this treated? ?Treatment for this condition depends on where the blockage is, how long it has been there, and what caused it. The goal of treatment is to remove the blockage. Treatment may include: ?Antibiotic medicines to treat or prevent infection. ?A procedure to place a small, thin tube (stent) into a blocked ureter. The stent will keep the ureter open so that urine can drain through it. ?A nonsurgical procedure that crushes kidney stones with shock waves  (extracorporeal shock wave lithotripsy). ?If kidney failure occurs, treatment may include dialysis or a kidney transplant. ?Follow these instructions at home: ? ?Take over-the-counter and prescription medicines only as told by your health care provider. ?If you were prescribed an antibiotic medicine, take it exactly as told by your health care provider. Do not stop taking the antibiotic even if you start to feel better. ?Rest and return to your normal activities as told by your health care provider. Ask your health care provider what activities are safe for you. ?Drink enough fluid to keep your urine pale yellow. ?Keep all follow-up visits. This is important. ?Contact a health care provider if: ?You continue to have symptoms after treatment. ?You develop new symptoms. ?Your urine becomes cloudy or bloody. ?You have a fever. ?Get help right away if: ?You have severe flank or abdominal pain. ?You cannot drink fluids without vomiting. ?Summary ?Hydronephrosis is the swelling of one or both kidneys due to a blockage that stops urine from flowing out of the body. ?Hydronephrosis can lead to kidney failure and may become life-threatening if not treated promptly. ?The goal of treatment is to remove the blockage. It may include a procedure to insert a stent into a blocked ureter, a procedure to break up kidney stones, or taking antibiotic medicines. ?Follow your health care provider's instructions for taking care of yourself at home, including instructions about drinking fluids, taking medicines, and limiting activities. ?This information is not intended to replace advice given to you by your health care provider. Make sure you discuss any questions you have with your health care provider. ?Document Revised: 01/09/2020 Document Reviewed: 01/09/2020 ?Elsevier Patient   Education ? 2022 [employer] ? ?

## 2021-09-18 ENCOUNTER — Other Ambulatory Visit: Payer: Self-pay

## 2021-09-18 ENCOUNTER — Inpatient Hospital Stay: Payer: 59

## 2021-09-18 ENCOUNTER — Inpatient Hospital Stay: Payer: 59 | Attending: Oncology | Admitting: Oncology

## 2021-09-18 ENCOUNTER — Encounter: Payer: Self-pay | Admitting: Oncology

## 2021-09-18 VITALS — BP 142/89 | HR 79 | Temp 98.1°F | Resp 18 | Ht 68.0 in | Wt 208.0 lb

## 2021-09-18 DIAGNOSIS — C20 Malignant neoplasm of rectum: Secondary | ICD-10-CM

## 2021-09-18 DIAGNOSIS — E041 Nontoxic single thyroid nodule: Secondary | ICD-10-CM | POA: Insufficient documentation

## 2021-09-18 DIAGNOSIS — C787 Secondary malignant neoplasm of liver and intrahepatic bile duct: Secondary | ICD-10-CM

## 2021-09-18 DIAGNOSIS — Z87891 Personal history of nicotine dependence: Secondary | ICD-10-CM | POA: Diagnosis not present

## 2021-09-18 DIAGNOSIS — R200 Anesthesia of skin: Secondary | ICD-10-CM | POA: Diagnosis not present

## 2021-09-18 DIAGNOSIS — G43909 Migraine, unspecified, not intractable, without status migrainosus: Secondary | ICD-10-CM | POA: Insufficient documentation

## 2021-09-18 DIAGNOSIS — Z95828 Presence of other vascular implants and grafts: Secondary | ICD-10-CM

## 2021-09-18 DIAGNOSIS — G2581 Restless legs syndrome: Secondary | ICD-10-CM | POA: Diagnosis not present

## 2021-09-18 LAB — CEA (ACCESS): CEA (CHCC): 2.3 ng/mL (ref 0.00–5.00)

## 2021-09-18 MED ORDER — HEPARIN SOD (PORK) LOCK FLUSH 100 UNIT/ML IV SOLN
500.0000 [IU] | Freq: Once | INTRAVENOUS | Status: AC
  Administered 2021-09-18: 500 [IU] via INTRAVENOUS

## 2021-09-18 MED ORDER — SODIUM CHLORIDE 0.9 % IV SOLN: Freq: Once | INTRAVENOUS | Status: DC

## 2021-09-18 MED ORDER — SODIUM CHLORIDE 0.9% FLUSH
10.0000 mL | Freq: Once | INTRAVENOUS | Status: AC
  Administered 2021-09-18: 10 mL via INTRAVENOUS

## 2021-09-18 NOTE — Patient Instructions (Signed)

## 2021-09-18 NOTE — Progress Notes (Signed)
[place] OFFICE PROGRESS NOTE   Diagnosis: Rectal cancer  INTERVAL HISTORY:   Norma Colon returns as scheduled.  She continues to have soreness in the lower abdomen following the right ureter injury.  She was admitted with urosepsis last month.    Objective:  Vital signs in last 24 hours:  Blood pressure (!) 142/89, pulse 79, temperature 98.1 F (36.7 C), temperature source Oral, resp. rate 18, height 5' 8"  (1.727 m), weight 208 lb (94.3 kg), SpO2 100 %.   Lymphatics: No cervical, supraclavicular, axillary, or inguinal nodes Resp: Distant breath sounds, no respiratory distress Cardio: Regular rate and rhythm GI: No hepatosplenomegaly, no mass Vascular: No leg edema   Portacath/PICC-without erythema  Lab Results:  Lab Results  Component Value Date   WBC 4.8 08/29/2021   HGB 10.7 (L) 08/29/2021   HCT 33.1 (L) 08/29/2021   MCV 93.5 08/29/2021   PLT 233 08/29/2021   NEUTROABS 2.6 08/26/2021    CMP  Lab Results  Component Value Date   NA 139 08/29/2021   K 3.9 08/29/2021   CL 108 08/29/2021   CO2 23 08/29/2021   GLUCOSE 93 08/29/2021   BUN 17 08/29/2021   CREATININE 1.01 (H) 08/29/2021   CALCIUM 9.1 08/29/2021   PROT 7.3 08/26/2021   ALBUMIN 3.1 (L) 08/26/2021   AST 36 08/26/2021   ALT 29 08/26/2021   ALKPHOS 78 08/26/2021   BILITOT 0.5 08/26/2021   GFRNONAA >60 08/29/2021   GFRAA >60 06/17/2020    Lab Results  Component Value Date   CEA1 6.85 (H) 06/17/2021   CEA 2.30 09/18/2021    Medications: I have reviewed the patient's current medications.   Assessment/Plan: Rectal cancer Colonoscopy 05/17/2018 revealed a partially obstructing mass beginning at 10 cm from the anal verge, biopsy confirmed invasive moderately differentiated adenocarcinoma CTs 05/24/2018-mass beginning at proximal he 7.6 cm from the anal verge, "20 "perirectal lymph nodes, no evidence of metastatic disease Pelvic MRI 06/01/2018- tumor measured at 10.8 cm from the  anal sphincter, T3b, N1-2 left perirectal lymph nodes, each measuring 5 mm Radiation and Xeloda initiated 06/13/2018, completed 07/20/2018 Low anterior resection 09/16/2018,ypT3,ypN1c, 1 cm tumor, partial response-score 2, 0/13 lymph nodes positive, 2 tumor deposits, MSI-stable, no loss of mismatch repair protein expression Cycle 1 CAPOX 10/17/2018 Cycle 2 CAPOX 11/07/2018 (oxaliplatin dose reduced due to neutropenia) Cycle 3 CAPOX 11/28/2018 Cycle 4 CAPOX 12/19/2018 CTs 05/29/2019-no evidence of metastatic disease, severe right hydronephrosis, left thyroid nodule CTs 04/11/2020-subtle nodular lesion in the inferior right liver suspicious for a new metastasis, status post right ureteral resection and psoas hitch/flap, resolution of hydronephrosis, sacral insufficiency fractures bilaterally MRI liver 05/08/2020-2.1 cm segment 6 lesion suspicious for a metastasis, 4 mm suspicious segment 3 lesion 4 mm segment 4B lesion consistent with a hemangioma, no abdominal lymphadenopathy, cholelithiasis Ablation of segment 6 and segment 3 lesions 06/19/2020, pathology from segment 6 biopsy confirmed metastatic adenocarcinoma-microsatellite stable, tumor mutation burden 3, K-ras wild type, NRAS wild-type, BRAF [id number] MRI abdomen 08/13/2020-ablation defects in segment 6 and segment 3, tiny foci of restricted diffusion with arterial phase enhancement at the junctions of segment 4A and segment 4B, and segment 5 concerning for metastatic disease CTs 09/09/2020-stable ablation sites at segment 6 and segment 2. Small focus of hyperenhancement measuring 7 mm in the inferior right liver segment 6 anterior to the ablation site, and in the anterior right liver segment 5/8-unchanged compared to MRI and concerning for metastatic disease, no other evidence of metastatic disease. Scattered irregular  groundglass opacities in the posterior right upper lobe Cycle 1 FOLFIRI/Avastin 09/23/2020 Treatment held 10/08/2020 due to neutropenia Cycle 2  FOLFIRI/Avastin 10/14/2020, Udenyca Cycle 3 FOLFIRI/Avastin 10/30/2020, Udenyca Cycle 4 FOLFIRI/Avastin 11/14/2020, Udenyca Cycle 5 FOLFIRI/Avastin 11/28/2020, Udenyca CTs 12/11/2020-no evidence of metastatic disease in the chest, stable liver ablation sites, stable hypervascular lesion in segment 4A, previously described enhancing lesion in the right hepatic lobe not well appreciated, no new lesions\ Real-time ultrasound evaluation by Dr. Laurence Ferrari 12/18/2020-no lesions are visible sonographically, therefore not a candidate for ablation Upper quadrant ultrasound 02/25/2021-no new or progressive hepatic metastases, prior ablation defects noted CTs 06/17/2021-unchanged 1.5 cm mixed attenuation left hepatic lesion, stable heterogenous lesion in the posterior right liver, no evidence of new metastatic disease-for review revealed 1.7 cm nodular lesion at the superior margin of the ablation site in the right liver concerning for recurrent disease CEA mildly elevated 06/17/2021 Ultrasound-guided biopsy of lesion followed by ablation encompassing the entirety of the tumor in the right liver, segment 6,, repeat CT revealed adequate ablation of the region of tumor, 07/30/2021, biopsy adenocarcinoma consistent with metastatic rectal adenocarcinoma CEA normal 08/08/2021 CT renal stone study 08/21/2021-microwave ablation defect in the right liver CT renal stone study 08/24/2021-decrease size of right liver hypodensity at site of prior microwave ablation History of Tobacco use Right UPJ stone on CT 05/24/2018 CT 11/21/2018-migration of previously noted right UPJ stone, new marked right hydroureteronephrosis Placement of a right JJ stent 11/21/2018 Laser lithotripsy and replacement of right JJ stent 12/12/2018 Followed by Dr. Alyson Ingles, urology Migraine headaches Restless legs Arm pain during the oxaliplatin infusion and for 1 week after cycle 1.  The oxaliplatin will be further diluted and infusion time extended getting  with cycle 2. Neutropenia secondary to chemotherapy.  Oxaliplatin dose reduced beginning with cycle 2. 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 Alyson Ingles, surgical reimplantation of the right ureter on 08/03/2019, right ureter stent removed 09/14/2019 Thyroid nodules-new versus enlarged left thyroid nodule CT 05/29/2019, thyroid ultrasound 06/20/2019-multiple nodules, 1 right-sided nodule met criteria for ultrasound follow-up Ultrasound 05/08/2020-multinodular goiter, stable nodules in the isthmus and right thyroid meeting criteria for 1 year follow-up, additional stable nodules do not meet criteria for follow-up 12.  Right ureter injury following Foley catheter placement 07/30/2021-pyelogram confirmed extravasation of urine, placement of right ureter stent and Foley catheter 07/31/2021 Right ureter stent removed 09/04/2021 13.  Admission with urosepsis 08/24/2021    Disposition: Ms. Bjorn is in clinical remission from rectal cancer.  The right liver ablation site was smaller on renal stone study CT 08/24/2021.  The Port-A-Cath remains in place.  She will return for an office visit and Port-A-Cath flush 11/04/2020.  I will communicate with [place] regarding the timing of repeat liver imaging.   Betsy Coder, MD  09/18/2021  3:52 PM

## 2021-10-07 ENCOUNTER — Encounter: Payer: Self-pay | Admitting: Oncology

## 2021-10-07 ENCOUNTER — Encounter: Payer: Self-pay | Admitting: Gastroenterology

## 2021-10-07 ENCOUNTER — Ambulatory Visit (INDEPENDENT_AMBULATORY_CARE_PROVIDER_SITE_OTHER): Payer: 59 | Admitting: Gastroenterology

## 2021-10-07 ENCOUNTER — Other Ambulatory Visit: Payer: Self-pay

## 2021-10-07 VITALS — BP 132/88 | HR 109 | Ht 68.0 in | Wt 204.3 lb

## 2021-10-07 DIAGNOSIS — Z85038 Personal history of other malignant neoplasm of large intestine: Secondary | ICD-10-CM | POA: Diagnosis not present

## 2021-10-07 DIAGNOSIS — K5909 Other constipation: Secondary | ICD-10-CM | POA: Diagnosis not present

## 2021-10-07 DIAGNOSIS — C20 Malignant neoplasm of rectum: Secondary | ICD-10-CM | POA: Diagnosis not present

## 2021-10-07 MED ORDER — CLENPIQ 10-3.5-12 MG-GM -GM/160ML PO SOLN: 1.0000 | ORAL | 0 refills | Status: DC

## 2021-10-07 MED ORDER — MOTEGRITY 2 MG PO TABS
2.0000 mg | ORAL_TABLET | Freq: Every day | ORAL | 5 refills | Status: DC
Start: 2021-10-07 — End: 2022-07-30

## 2021-10-07 NOTE — Progress Notes (Addendum)
Chief Complaint:    Colon cancer follow-up, schedule colonoscopy  GI History: 61 year old female with rectal cancer diagnosed on colonoscopy in 05/2018.  Initial CT and MRI without metastatic disease. - Treated with radiation and Xeloda followed by low anterior resection on 09/16/2018 with 0/13 lymph nodes positive (MSI stable, no loss of MMR), then CAPOX which completed in 12/2018. - CT in 05/2019 without metastatic disease. - CT in 04/2020 with subtle nodular lesion in inferior right liver suspicious for new metastasis - MRI liver 05/2020: 2.1 cm segment 6 and 4 mm segment 3 lesions suspicious for metastasis, hemangiomas - 06/2020: Ablation of segment 6 and segment 3 lesions (biopsy confirmed metastatic adenocarcinoma) - MRI abdomen 08/2020: Ablation defects in segment 6 and segment 3, new lesion at junction of segment 4A/4B and segment 5 concerning for metastasis - CT 09/2020: Stable ablation sites, unchanged sites concerning for metastasis but no new metastatic disease - 09/2020-11/2020: FOLFIRI/Avastin, Ellen Henri - CT 12/2020: Stable hypervascular lesion in segment 4A, otherwise no areas of metastasis - RUQ [place] 02/2021: No new or progressive hepatic metastasis - CT 06/2021: Unchanged 1.5 cm mixed attenuation left hepatic lesion, 1.7 cm nodular lesion at superior margin of ablation site concerning for recurrent disease - 07/2021: Ultrasound-guided 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  CEA normal - 07/2021: Microwave ablation - 08/2021: CT renal protocol with reduced right liver ablation site   Additionally had right UPJ stone initially seen on CT in 05/2018, with migration on CT in 11/2018 with right hydroureteronephrosis requiring right JJ stent in 11/2018 followed by lithotripsy and stent replacement in 12/2018.  Right hydronephrosis on repeat CT in 05/2019 with surgical reimplantation of right ureter on 08/03/2019, right ureter stent removal on  09/14/2019.  Additionally, history of constipation.  Linzess was efficacious but cost prohibitive.  Amitiza started in 07/2021.   Endoscopic history: -Colonoscopy (05/17/2018): 7 cm partially obstructing mass in the proximal rectum, located 10 cm from anal verge (rectal adenocarcinoma).  Fair prep -Colonoscopy (05/19/2018): Partial obstructing mass again noted in proximal rectum, tattoo placed.  Benign rectal polyps, otherwise normal.  Skin tags.  Normal TI -Colonoscopy (12/15/2019): Patent healthy-appearing anastomosis, single diverticulum in the ascending colon, internal hemorrhoids, otherwise normal  HPI:     Patient is a 61 y.o. female presenting to the [facility]ology [facility] for follow-up.   Prolonged hospital course in 07/2021 following microwave ablation of metastatic liver lesion. Postoperative course complicated by right ureteral injury with extravasation of urine into peritoneum requiring urgent cystoscopy with ureteral stent and right kidney/bladder and indwelling Foley catheter.  Readmitted 08/24/2021 with sepsis treated with IV cefepime.  Repeat cystoscopy as outpatient on 09/04/2021 with 6 French ureteral catheter into right ureteral orifice and normal retrograde pyelogram.  Ureteral stent removed 12/7.   Last seen by Dr. Benay Spice in Oncology on 09/18/2021.  Currently in clinical remission from rectal CA.  Port-A-Cath remains in place.  Most recent CEA was normal.  She presents today to discuss repeat colonoscopy to evaluate for metachronous colon cancer and continued high risk screening.  Not much change in bowel habits with Amitiza. Takes fiber pills and Senna. Drinks plenty of water/day.  Linzess was efficacious but cost prohibitive.   Review of systems:     No chest pain, no SOB, no fevers, no urinary sx   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[facility]) oncologist-- dr Benay Spice--  per last note in epic ,  clinical remission   dx 08/ 2019----  chemoradiation concurrent completed 07-20-2018;  s/p  low anterior resection 09-16-2018;   chemo 10-17-2018  ot 12-19-2018   Anxiety    Arthritis    Chemotherapy induced neutropenia (HCC)    Depression    Headache    migraines    History of HPV infection    History of kidney stones    Hydronephrosis, right    Restless leg syndrome    Scoliosis    Sepsis ([place])    11-2018    Patient's surgical history, family medical history, social history, medications and allergies were all reviewed in Epic    Current Outpatient Medications  Medication Sig Dispense Refill   alprazolam (XANAX) 2 MG tablet Take 2 mg by mouth every morning.      buPROPion (WELLBUTRIN XL) 300 MG 24 hr tablet Take 300 mg by mouth every morning.      cyclobenzaprine (FLEXERIL) 10 MG tablet Take 20 mg by mouth at bedtime.     HYDROcodone-acetaminophen (NORCO) 10-325 MG tablet Take 1-2 tablets by mouth every 6 (six) hours as needed for moderate pain or severe pain (headache). Post-Operatively. 30 tablet 0   lubiprostone (AMITIZA) 24 MCG capsule Take 1 capsule (24 mcg total) by mouth 2 (two) times daily with a meal. 180 capsule 5   rOPINIRole (REQUIP) 1 MG tablet Take 1 mg by mouth at bedtime.     No current facility-administered medications for this visit.    Physical Exam:     BP 132/88 (BP Location: Left Arm, Patient Position: Sitting, Cuff Size: Normal)    Pulse (!) 109    Ht 5' 8"  (1.727 m)    Wt 204 lb 5 oz (92.7 kg)    LMP  (LMP Unknown)    SpO2 98%    BMI 31.07 kg/m   GENERAL:  Pleasant, conversive, in NAD PSYCH: : Cooperative, normal affect Musculoskeletal:  Normal muscle tone, normal strength NEURO: Alert and oriented x 3, no focal neurologic deficits   IMPRESSION and PLAN:    1) Colon Cancer (Rectal Adenocarcinoma): - Plan for repeat colonoscopy for ongoing short interval surveillance - Will coordinate with Dr. Alyson Ingles (Urology) to ensure no barriers from a urological standpoint to repeat colonoscopy  next month - We will coordinate with Dr. Benay Spice and Dr. Laurence Ferrari re: plan/timing for repeat imaging for evaluation of previous hepatic ablation sites  2) Chronic constipation: - Tried providing Rx for Trulance 3 mg/day; not covered by her insurance - Linzess efficacious but not covered by insurance - Provided with samples for Trulance today.  Take 2 mg daily. Will stop Amitiza once she starts the Trulance - If Trulance still not covered, plan is to start Dolores - Continue adequate hydration - After 7 days of Trulance, okay to reinitiate MiraLAX as needed - Continue fiber  3) History of right ureteral injury - Has follow-up with Urology next week  The indications, risks, and benefits of colonoscopy were explained to the patient in detail. Risks include but are not limited to bleeding, perforation, adverse reaction to medications, and cardiopulmonary compromise. Sequelae include but are not limited to the possibility of surgery, hospitalization, and mortality. The patient verbalized understanding and wished to proceed. All questions answered, referred to the scheduler and bowel prep ordered. Further recommendations pending results of the exam.    Lavena Bullion ,DO, FACG 10/07/2021, 2:42 PM

## 2021-10-07 NOTE — Patient Instructions (Addendum)
If you are age 59 or older, your body mass index should be between 23-30. Your Body mass index is 31.07 kg/m. If this is out of the aforementioned range listed, please consider follow up with your Primary Care Provider.  If you are age 26 or younger, your body mass index should be between 19-25. Your Body mass index is 31.07 kg/m. If this is out of the aformentioned range listed, please consider follow up with your Primary Care Provider.   __________________________________________________________  The [facility] providers would like to encourage you to use [facility] to communicate with providers for non-urgent requests or questions.  Due to long hold times on the telephone, sending your provider a message by [facility] may be a faster and more efficient way to get a response.  Please allow 48 business hours for a response.  Please remember that this is for non-urgent requests.    Due to recent changes in healthcare laws, you may see the results of your imaging and laboratory studies on MyChart before your provider has had a chance to review them.  We understand that in some cases there may be results that are confusing or concerning to you. Not all laboratory results come back in the same time frame and the provider may be waiting for multiple results in order to interpret others.  Please give [place] 48 hours in order for your provider to thoroughly review all the results before contacting the office for clarification of your results.   You have been scheduled for a colonoscopy. Please follow written instructions given to you at your visit today.  Please pick up your prep supplies at the pharmacy within the next 1-3 days. If you use inhalers (even only as needed), please bring them with you on the day of your procedure.   We have given you samples of Motegrity 2 mg you will take 1 daily.  We have sent a prescription to your pharmacy and will try to send for a prior authorization.  We have also sent your  prescription for Clenpiq to the pharmacy    Thank you for choosing me and [facility].  Vito Cirigliano, D.O.

## 2021-10-08 ENCOUNTER — Other Ambulatory Visit: Payer: Self-pay | Admitting: *Deleted

## 2021-10-08 ENCOUNTER — Other Ambulatory Visit: Payer: Self-pay | Admitting: Interventional Radiology

## 2021-10-08 DIAGNOSIS — C189 Malignant neoplasm of colon, unspecified: Secondary | ICD-10-CM

## 2021-10-08 DIAGNOSIS — C787 Secondary malignant neoplasm of liver and intrahepatic bile duct: Secondary | ICD-10-CM

## 2021-10-08 DIAGNOSIS — C20 Malignant neoplasm of rectum: Secondary | ICD-10-CM

## 2021-10-10 ENCOUNTER — Telehealth: Payer: Self-pay | Admitting: Gastroenterology

## 2021-10-10 NOTE — Telephone Encounter (Addendum)
Called and spoke with patient. She states that she noticed BRB when she wiped 2 days ago. Pt states that she around 4:30 am she had uncontrollable diarrhea and messed up her bed and clothes. Pt has not had any recurrence of bleeding. She thinks the bleeding was from a previous surgery that has not completely healed. I did confirm with her that she is doing a 2-day prep with Clenpiq and Miralax for her procedure. Pt was advised to contact us if she has a recurrence of bleeding. Patient verbalized understanding and had no concerns at the end of the call.

## 2021-10-10 NOTE — Telephone Encounter (Signed)
Inbound call from patient, stated that she has 2 different preps at her pharmacy and needs to know which one she needs to take for her prep. Also, stated that she had diarrhea last night that had blood in it. Seeking advice, please advise.

## 2021-10-13 ENCOUNTER — Ambulatory Visit (HOSPITAL_COMMUNITY)
Admission: RE | Admit: 2021-10-13 | Discharge: 2021-10-13 | Disposition: A | Discharge status: Home / Self Care | Payer: 59 | Source: Ambulatory Visit | Attending: Interventional Radiology | Admitting: Interventional Radiology

## 2021-10-13 ENCOUNTER — Encounter (HOSPITAL_COMMUNITY): Payer: Self-pay

## 2021-10-13 ENCOUNTER — Ambulatory Visit (HOSPITAL_COMMUNITY): Payer: 59

## 2021-10-13 ENCOUNTER — Other Ambulatory Visit: Payer: Self-pay

## 2021-10-13 DIAGNOSIS — C189 Malignant neoplasm of colon, unspecified: Secondary | ICD-10-CM | POA: Diagnosis present

## 2021-10-13 DIAGNOSIS — N135 Crossing vessel and stricture of ureter without hydronephrosis: Secondary | ICD-10-CM | POA: Diagnosis present

## 2021-10-13 DIAGNOSIS — C787 Secondary malignant neoplasm of liver and intrahepatic bile duct: Secondary | ICD-10-CM | POA: Diagnosis present

## 2021-10-13 MED ORDER — IOHEXOL 350 MG/ML SOLN
100.0000 mL | Freq: Once | INTRAVENOUS | Status: AC | PRN
  Administered 2021-10-13: 100 mL via INTRAVENOUS

## 2021-10-14 ENCOUNTER — Ambulatory Visit (HOSPITAL_COMMUNITY)
Admission: RE | Admit: 2021-10-14 | Discharge: 2021-10-14 | Disposition: A | Discharge status: Home / Self Care | Payer: 59 | Source: Ambulatory Visit | Attending: Urology | Admitting: Urology

## 2021-10-14 DIAGNOSIS — N135 Crossing vessel and stricture of ureter without hydronephrosis: Secondary | ICD-10-CM

## 2021-10-14 DIAGNOSIS — C189 Malignant neoplasm of colon, unspecified: Secondary | ICD-10-CM | POA: Diagnosis not present

## 2021-10-15 ENCOUNTER — Other Ambulatory Visit: Payer: Self-pay

## 2021-10-15 ENCOUNTER — Ambulatory Visit
Admission: RE | Admit: 2021-10-15 | Discharge: 2021-10-15 | Disposition: A | Discharge status: Home / Self Care | Payer: 59 | Source: Ambulatory Visit | Attending: Radiology | Admitting: Radiology

## 2021-10-15 ENCOUNTER — Encounter: Payer: Self-pay | Admitting: *Deleted

## 2021-10-15 DIAGNOSIS — C787 Secondary malignant neoplasm of liver and intrahepatic bile duct: Secondary | ICD-10-CM

## 2021-10-15 HISTORY — PX: IR RADIOLOGIST EVAL & MGMT: IMG5224

## 2021-10-15 NOTE — Progress Notes (Signed)
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    Referring Physician(s): Julieanne Manson, MD  History of Present Illness: Norma Colon is a 61 y.o. female who initially presented at the kind request of Dr. 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  Norma Colon was initially diagnosed with a partially obstructing rectal mass in August 2019.  She underwent neoadjuvant combined radiation and chemotherapy (Xeloda) from September through October 2019 followed by a low anterior resection in December 2019.     Subsequently, she underwent 4 cycles of additional adjuvant chemotherapy with CAPOX completed in March 2020.  Subsequent surveillance imaging initially detected no evidence of metastatic disease.  Unfortunately, CT imaging from July 2021 demonstrated a subtle lesion in hepatic segment 6 concerning for metastatic disease.  A follow-up MRI dated 05/08/2020 confirms a 2.1 cm solid lesion in hepatic segment 6 as well as a concerning for millimeter lesion in the posterior aspect of segment 3.  Both lesions are concerning for metastatic disease.   She underwent concurrent ultrasound-guided biopsy and microwave ablation of the 2 liver lesions on 06/19/2020.     Follow-up MRI imaging from 08/13/20 showed an excellent treatment effect at the sites of the 2 ablated lesions, but 2 new lesions highly concerning for additional metastatic foci.  [name] therefore went back for additional chemotherapy and has now completed another cycle of FOLFIRI/Avastin on 11/28/2020.  She reported increased malaise following this cycle of chemotherapy.  She had diarrhea and mild nosebleeding.   Restaging CT scans of the chest, abdomen and pelvis dated 12/12/2020 demonstrates no evidence of new metastatic disease or disease outside the liver.  The previously  identified tiny hypervascular lesions in segment 4A and segment 6 are less conspicuous and perhaps slightly smaller consistent with interval treatment response.     US imaging performed by me on  12/18/20 and again on 02/25/21 showed no visible hepatic lesions.    Re-staging CT scans 06/17/21 - New 1.7 cm lesion adjacent to the posterosuperior margin of the segment 6 ablation defect concerning for recurrent disease.    She underwent CT-guided biopsy and repeat microwave ablation of the recurrent tumor on 07/30/2021.  Unfortunately, at that time she sustained an inadvertent injury to the right ureter presumably due to unfortunate Foley catheter placement with in the reimplanted ureter.  Fortunately, we were able to diagnose this within the first day and urology was able to traverse the injury and place a double-J ureteral stent.  She also developed acute kidney injury with elevated creatinine due to 2 peritoneal reabsorption of the leaking urine.  CT CAP 10/13/2021: Post ablation defects in the liver without evidence of progressive disease or new metastatic disease in the chest, abdomen or pelvis.  Overall, Norma Colon is doing well.  She did have some blood per rectum recently.  She is undergoing a colonoscopy in the near future which should assess this.  Additionally, she continues to follow-up with Dr. Alyson Ingles.    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[facility]) oncologist-- dr Benay Spice--  per last note in epic ,  clinical remission   dx 08/ 2019---- chemoradiation concurrent completed 07-20-2018;  s/p  low anterior resection 09-16-2018;   chemo 10-17-2018  ot 12-19-2018   Anxiety    Arthritis    Chemotherapy induced neutropenia ([place])    Depression  Headache    migraines    History of HPV infection    History of kidney stones    Hydronephrosis, right    Restless leg syndrome    Scoliosis    Sepsis ([place])    11-2018    Past Surgical History:  Procedure Laterality  Date   COLONOSCOPY     COLPOSCOPY  04/04/2018   CYSTOSCOPY W/ RETROGRADES Right 07/31/2021   Procedure: CYSTOSCOPY WITH RETROGRADE PYELOGRAM, URETEROSCOPY AND STENT PLACEMENT;  Surgeon: Janith Lima, MD;  Location: [facility];  Service: Urology;  Laterality: Right;   CYSTOSCOPY W/ URETERAL STENT PLACEMENT Right 09/04/2021   Procedure: CYSTOSCOPY WITH RETROGRADE PYELOGRAM WITH STENT REMOVAL;  Surgeon: Cleon Gustin, MD;  Location: [facility];  Service: Urology;  Laterality: Right;   CYSTOSCOPY WITH RETROGRADE PYELOGRAM, URETEROSCOPY AND STENT PLACEMENT Right 12/12/2018   Procedure: CYSTOSCOPY WITH RETROGRADE PYELOGRAM, URETEROSCOPY, STONE EXTRACTION AND STENT PLACEMENT;  Surgeon: Cleon Gustin, MD;  Location: [facility];  Service: Urology;  Laterality: Right;   CYSTOSCOPY WITH RETROGRADE PYELOGRAM, URETEROSCOPY AND STENT PLACEMENT Right 04/17/2019   Procedure: CYSTOSCOPY WITH RETROGRADE PYELOGRAM, BALLOON [place]  URETEROSCOPY AND STENT PLACEMENT;  Surgeon: Cleon Gustin, MD;  Location[facility];  Service: Urology;  Laterality: Right;   CYSTOSCOPY WITH STENT PLACEMENT Right 11/21/2018   Procedure: CYSTOSCOPY, RIGHT RETROGRADE PYELOGRAM, WITH RIGHT URETERAL STENT PLACEMENT;  Surgeon: Cleon Gustin, MD;  Location: [facility];  Service: Urology;  Laterality: Right;   IR IMAGING GUIDED PORT INSERTION  09/04/2020   IR NEPHROSTOMY PLACEMENT RIGHT  06/22/2019   IR PATIENT EVAL TECH 0-60 MINS  06/28/2019   IR RADIOLOGIST EVAL & MGMT  06/06/2020   IR RADIOLOGIST EVAL & MGMT  07/17/2020   IR RADIOLOGIST EVAL & MGMT  08/15/2020   IR RADIOLOGIST EVAL & MGMT  09/03/2020   IR RADIOLOGIST EVAL & MGMT  12/18/2020   IR RADIOLOGIST EVAL & MGMT  02/25/2021   IR RADIOLOGIST EVAL & MGMT  06/23/2021   IR RADIOLOGIST EVAL & MGMT  08/12/2021   left forearm surgery due to calcium rock      RADIOLOGY WITH ANESTHESIA N/A 06/19/2020   Procedure: CT WITH  ANESTHESIA MICROWAVE ABLATION;  Surgeon: Jacqulynn Cadet, MD;  Location: [facility];  Service: Anesthesiology;  Laterality: N/A;   RADIOLOGY WITH ANESTHESIA N/A 07/30/2021   Procedure: CT MICROWAVE ABLATION OF THE LIVER;  Surgeon: Criselda Peaches, MD;  Location: [facility];  Service: Radiology;  Laterality: N/A;   ROBOT ASSISTED PYELOPLASTY Right 08/03/2019   Procedure: XI ROBOTIC ASSISTED PYELOPLASTY WITH STENT PLACEMENT;  Surgeon: Cleon Gustin, MD;  Location: [facility];  Service: Urology;  Laterality: Right;  3 HRS   SHOULDER ARTHROSCOPY W/ ROTATOR CUFF REPAIR Right    XI ROBOTIC ASSISTED LOWER ANTERIOR RESECTION N/A 09/16/2018   Procedure: XI ROBOTIC ASSISTED LOWER ANTERIOR RESECTION ERAS PATHWAY;  Surgeon: Leighton Ruff, MD;  Location[facility];  Service: General;  Laterality: N/A;    Allergies: Prednisone and Codeine  Medications: Prior to Admission medications   Medication Sig Start Date End Date Taking? Authorizing Provider  alprazolam Duanne Moron) 2 MG tablet Take 2 mg by mouth every morning.     [provider]  buPROPion (WELLBUTRIN XL) 300 MG 24 hr tablet Take 300 mg by mouth every morning.     [provider]  cyclobenzaprine (FLEXERIL) 10 MG tablet Take 20 mg by mouth at bedtime.    [provider]  HYDROcodone-acetaminophen (  NORCO) 10-325 MG tablet Take 1-2 tablets by mouth every 6 (six) hours as needed for moderate pain or severe pain (headache). Post-Operatively. 09/04/21   McKenzie, Candee Furbish, MD  lubiprostone (AMITIZA) 24 MCG capsule Take 1 capsule (24 mcg total) by mouth 2 (two) times daily with a meal. 07/16/21 01/07/23  Cirigliano, Vito V, DO  Prucalopride Succinate (MOTEGRITY) 2 MG TABS Take 1 tablet (2 mg total) by mouth daily. 10/07/21   Cirigliano, Vito V, DO  rOPINIRole (REQUIP) 1 MG tablet Take 1 mg by mouth at bedtime.    [provider]  Sod Picosulfate-Mag Ox-Cit Acd (CLENPIQ) 10-3.5-12 MG-GM -GM/160ML SOLN Take 1 kit by mouth as  directed. 10/07/21   Cirigliano, Dominic Pea, DO     Family History  Problem Relation Age of Onset   COPD Father        smoker   Stomach cancer Father        spread to lungs and bones   Arthritis Mother    Heart attack Paternal Grandmother    Heart attack Paternal Grandfather    Heart attack Maternal Grandmother    Stroke Maternal Grandmother    Colon cancer Neg Hx    Rectal cancer Neg Hx    Esophageal cancer Neg Hx     Social History   Socioeconomic History   Marital status: Legally Separated    Spouse name: Not on file   Number of children: 1   Years of education: Not on file   Highest education level: Not on file  Occupational History   Occupation: Land  Tobacco Use   Smoking status: Former    Packs/day: 1.00    Years: 30.00    Pack years: 30.00    Types: Cigarettes    Quit date: 09/16/2018    Years since quitting: 3.0   Smokeless tobacco: Never  Vaping Use   Vaping Use: Never used  Substance and Sexual Activity   Alcohol use: Yes    Comment: social   Drug use: Never   Sexual activity: Yes  Other Topics Concern   Not on file  Social History Narrative   Not on file   Social Determinants of Health   Financial Resource Strain: Not on file  Food Insecurity: Not on file  Transportation Needs: Not on file  Physical Activity: Not on file  Stress: Not on file  Social Connections: Not on file    ECOG Status: 1 - Symptomatic but completely ambulatory  Review of Systems  Review of Systems: A 12 point ROS discussed and pertinent positives are indicated in the HPI above.  All other systems are negative.  Physical Exam No direct physical exam was performed (except for noted visual exam findings with Video Visits).    Vital Signs: LMP  (LMP Unknown)   Imaging: CT ABDOMEN PELVIS W WO CONTRAST  Result Date: 10/14/2021 CLINICAL DATA:  61 year old female with history of colon cancer with metastatic disease to the liver status post ablation procedure.  Follow-up study. EXAM: CT CHEST, ABDOMEN AND PELVIS WITHOUT CONTRAST TECHNIQUE: Multidetector CT imaging of the chest, abdomen and pelvis was performed following the standard protocol without IV contrast. COMPARISON:  CT the chest, abdomen and pelvis 06/17/2021. CTA abdomen and pelvis 08/21/2021. CT the abdomen and pelvis without contrast 08/24/2021. FINDINGS: CT CHEST FINDINGS Cardiovascular: Heart size is normal. There is no significant pericardial fluid, thickening or pericardial calcification. Aortic atherosclerosis. No definite coronary artery calcifications. Right internal jugular single-lumen porta cath with tip terminating  in the superior aspect of the right atrium. Mediastinum/Nodes: No pathologically enlarged mediastinal or hilar lymph nodes. Please note that accurate exclusion of hilar adenopathy is limited on noncontrast CT scans. Esophagus is unremarkable in appearance. No axillary lymphadenopathy. Lungs/Pleura: No suspicious appearing pulmonary nodules or masses are noted. No acute consolidative airspace disease. No pleural effusions. Musculoskeletal: There are no aggressive appearing lytic or blastic lesions noted in the visualized portions of the skeleton. CT ABDOMEN PELVIS FINDINGS Hepatobiliary: In the right lobe of the liver predominantly in segment 6 (axial image 41 of series 12)) there is a well-defined 4.0 x 3.0 cm lesion which is heterogeneous in attenuation with both low-attenuation and intermediate attenuation components, but without definite internal enhancement, which appears very similar to prior examinations, most compatible with a post ablation defect. In segment 3 of the liver there is also a well-defined 1.3 x 1.1 cm low-attenuation lesion (axial image 34 of series 12)) which is stable compared to prior examinations, also compatible with a small ablation defect. No other definite new suspicious appearing hepatic lesions are confidently identified on today's examination. No intra or  extrahepatic biliary ductal dilatation. Numerous partially calcified gallstones are noted within the gallbladder. Gallbladder is nearly completely collapsed around these indwelling gallstones, with no surrounding inflammatory changes to suggest an acute cholecystitis at this time. Pancreas: No pancreatic mass. No pancreatic ductal dilatation. No pancreatic or peripancreatic fluid collections or inflammatory changes. Spleen: Unremarkable. Adrenals/Urinary Tract: Mild right renal atrophy. Bilateral kidneys and bilateral adrenal glands are otherwise normal in appearance. No hydroureteronephrosis. Urinary bladder is normal in appearance. Stomach/Bowel: The appearance of the stomach is normal. No pathologic dilatation of small bowel or colon. Status post partial colectomy with suture line near the rectosigmoid junction. No soft tissue mass adjacent to the suture line to suggest locally recurrent disease. Normal appendix. Vascular/Lymphatic: No significant atherosclerotic disease, aneurysm or dissection noted in the abdominal or pelvic vasculature. Circumaortic left renal vein (normal anatomical variant) incidentally noted. No lymphadenopathy noted in the abdomen or pelvis. Reproductive: Uterus and ovaries are a trophic. Other: No significant volume of ascites.  No pneumoperitoneum. Musculoskeletal: Bilateral pars defects are noted at L5 with 17 mm of anterolisthesis of L5 upon S1. There are no aggressive appearing lytic or blastic lesions noted in the visualized portions of the skeleton. IMPRESSION: 1. Stable appearance of post ablation defects in the liver. No new signs of metastatic disease in the chest, abdomen or pelvis. 2. Cholelithiasis without evidence of acute cholecystitis at this time. 3. Aortic atherosclerosis. 4. Additional incidental findings, as above. Electronically Signed   By: Vinnie Langton M.D.   On: 10/14/2021 11:26   CT CHEST WO CONTRAST  Result Date: 10/14/2021 CLINICAL DATA:  61 year old  female with history of colon cancer with metastatic disease to the liver status post ablation procedure. Follow-up study. EXAM: CT CHEST, ABDOMEN AND PELVIS WITHOUT CONTRAST TECHNIQUE: Multidetector CT imaging of the chest, abdomen and pelvis was performed following the standard protocol without IV contrast. COMPARISON:  CT the chest, abdomen and pelvis 06/17/2021. CTA abdomen and pelvis 08/21/2021. CT the abdomen and pelvis without contrast 08/24/2021. FINDINGS: CT CHEST FINDINGS Cardiovascular: Heart size is normal. There is no significant pericardial fluid, thickening or pericardial calcification. Aortic atherosclerosis. No definite coronary artery calcifications. Right internal jugular single-lumen porta cath with tip terminating in the superior aspect of the right atrium. Mediastinum/Nodes: No pathologically enlarged mediastinal or hilar lymph nodes. Please note that accurate exclusion of hilar adenopathy is limited on noncontrast CT scans.  Esophagus is unremarkable in appearance. No axillary lymphadenopathy. Lungs/Pleura: No suspicious appearing pulmonary nodules or masses are noted. No acute consolidative airspace disease. No pleural effusions. Musculoskeletal: There are no aggressive appearing lytic or blastic lesions noted in the visualized portions of the skeleton. CT ABDOMEN PELVIS FINDINGS Hepatobiliary: In the right lobe of the liver predominantly in segment 6 (axial image 41 of series 12)) there is a well-defined 4.0 x 3.0 cm lesion which is heterogeneous in attenuation with both low-attenuation and intermediate attenuation components, but without definite internal enhancement, which appears very similar to prior examinations, most compatible with a post ablation defect. In segment 3 of the liver there is also a well-defined 1.3 x 1.1 cm low-attenuation lesion (axial image 34 of series 12)) which is stable compared to prior examinations, also compatible with a small ablation defect. No other definite  new suspicious appearing hepatic lesions are confidently identified on today's examination. No intra or extrahepatic biliary ductal dilatation. Numerous partially calcified gallstones are noted within the gallbladder. Gallbladder is nearly completely collapsed around these indwelling gallstones, with no surrounding inflammatory changes to suggest an acute cholecystitis at this time. Pancreas: No pancreatic mass. No pancreatic ductal dilatation. No pancreatic or peripancreatic fluid collections or inflammatory changes. Spleen: Unremarkable. Adrenals/Urinary Tract: Mild right renal atrophy. Bilateral kidneys and bilateral adrenal glands are otherwise normal in appearance. No hydroureteronephrosis. Urinary bladder is normal in appearance. Stomach/Bowel: The appearance of the stomach is normal. No pathologic dilatation of small bowel or colon. Status post partial colectomy with suture line near the rectosigmoid junction. No soft tissue mass adjacent to the suture line to suggest locally recurrent disease. Normal appendix. Vascular/Lymphatic: No significant atherosclerotic disease, aneurysm or dissection noted in the abdominal or pelvic vasculature. Circumaortic left renal vein (normal anatomical variant) incidentally noted. No lymphadenopathy noted in the abdomen or pelvis. Reproductive: Uterus and ovaries are a trophic. Other: No significant volume of ascites.  No pneumoperitoneum. Musculoskeletal: Bilateral pars defects are noted at L5 with 17 mm of anterolisthesis of L5 upon S1. There are no aggressive appearing lytic or blastic lesions noted in the visualized portions of the skeleton. IMPRESSION: 1. Stable appearance of post ablation defects in the liver. No new signs of metastatic disease in the chest, abdomen or pelvis. 2. Cholelithiasis without evidence of acute cholecystitis at this time. 3. Aortic atherosclerosis. 4. Additional incidental findings, as above. Electronically Signed   By: Vinnie Langton M.D.    On: 10/14/2021 11:26   Ultrasound renal complete  Result Date: 10/15/2021 CLINICAL DATA:  Re-evaluate right-sided hydronephrosis EXAM: RENAL / URINARY TRACT ULTRASOUND COMPLETE COMPARISON:  CT of 1 day prior FINDINGS: Right Kidney: Renal measurements: 8.5 x 3.4 x 5.6 cm = volume: 98.2 mL. Minimal right-sided caliectasis and pelviectasis, similar to yesterday's CT. Left Kidney: Renal measurements: 10.6 x 4.7 x 6.6 cm = volume: 174 mL. Echogenicity within normal limits. No mass or hydronephrosis visualized. Bladder: Appears normal for degree of bladder distention. Right ureteric jet not visualized. Other: None. IMPRESSION: Minimal right-sided pelvicaliectasis, similar to yesterday's CT. Electronically Signed   By: Abigail Miyamoto M.D.   On: 10/15/2021 10:28    Labs:  CBC: Recent Labs    08/25/21 0426 08/26/21 0905 08/28/21 0722 08/29/21 0458  WBC 4.7 4.3 4.4 4.8  HGB 10.4* 10.6* 10.8* 10.7*  HCT 32.0* 33.1* 32.9* 33.1*  PLT 157 184 203 233    COAGS: Recent Labs    07/15/21 1342  INR 1.1    BMP: Recent Labs  08/25/21 0426 08/26/21 0905 08/28/21 0722 08/29/21 0458  NA 134* 139 136 139  K 3.0* 4.1 4.2 3.9  CL 104 109 105 108  CO2 23 20* 21* 23  GLUCOSE 110* 111* 85 93  BUN 28* 17 18 17   CALCIUM 7.9* 8.5* 8.6* 9.1  CREATININE 1.21* 0.99 1.00 1.01*  GFRNONAA 52* >60 >60 >60    LIVER FUNCTION TESTS: Recent Labs    08/21/21 2118 08/24/21 1800 08/25/21 0426 08/26/21 0905  BILITOT 0.7 1.1 0.6 0.5  AST 19 29 24  36  ALT 22 24 22 29   ALKPHOS 99 95 78 78  PROT 7.5 8.2* 6.7 7.3  ALBUMIN 3.9 3.6 3.0* 3.1*    TUMOR MARKERS: Recent Labs    04/15/21 1417 06/17/21 1238 08/08/21 1130 09/18/21 1345  CEA 2.69 7.03* 3.05 2.30    Assessment and Plan:  Excellent response to repeat percutaneous microwave ablation without evidence of residual or recurrent disease in the liver and no evidence of new metastatic disease in the chest, abdomen or pelvis.  Additionally, her most  recent CEA remains normal.  This is excellent news!  We will continue routine imaging surveillance.  1.)  MRI abdomen with gadolinium contrast in 3 months.    Electronically Signed: Criselda Peaches 10/15/2021, 3:15 PM   I spent a total of  15 Minutes in remote  clinical consultation, greater than 50% of which was counseling/coordinating care for rectal cancer mets to liver.    Visit type: Audio only (telephone). Audio (no video) only due to patient preference. Alternative for in-person consultation at [facility], [address], [place], [place]. This visit type was conducted due to national recommendations for restrictions regarding the COVID-19 Pandemic (e.g. social distancing).  This format is felt to be most appropriate for this patient at this time.  All issues noted in this document were discussed and addressed.

## 2021-10-29 ENCOUNTER — Encounter: Payer: Self-pay | Admitting: Oncology

## 2021-11-03 ENCOUNTER — Encounter: Payer: Self-pay | Admitting: Oncology

## 2021-11-03 ENCOUNTER — Telehealth: Payer: Self-pay | Admitting: Gastroenterology

## 2021-11-03 ENCOUNTER — Telehealth: Payer: Self-pay

## 2021-11-03 NOTE — Telephone Encounter (Signed)
Pt called and said that she is having rectal bleeding x3 weeks and and back pain on the right , she feels like she is not getting any better. She think you and her Gi doctor need to talk to find out what going with her.

## 2021-11-03 NOTE — Telephone Encounter (Signed)
Returned call to patient. She states that she has been having BRB with some bowel movements. She does not think that the bleeding is from constipation or straining, she thinks that "something hasn't healed right". She has been scheduled for a f/u appt with Dr. Bryan Lemma on 11/05/21 at 1:40 pm. She is aware that this appt is at the [facility] office. Pt states that she finished the Motegrity samples, I told her that a prescription was sent to her pharmacy and she should be able to contact them about the cost before she goes to pick up the medicine. Pt wanted to know what would be the next medication if Motegrity is not covered or if it is too expensive for her. I told her that we would make Dr. Bryan Lemma aware and he would further advise. I told her that we could try to get her more samples also. She wanted to know if Dr. Bryan Lemma could talk with Dr. Alyson Ingles. She is aware that he is out of the office for the next couple of days but we will get this information to him. Pt verbalized understanding of all information and had no concerns at the end of the call.

## 2021-11-03 NOTE — Telephone Encounter (Signed)
Patient called this morning stating that she has been bleeding from her rectum 2-3 times a week for the last three weeks.  She has also finished the medication samples she was given and wants to know what she should take now.  She also said that Dr. Bryan Lemma and Dr. Alyson Ingles need to get together to decide what to do.  Please call patient and advise.  Thank you.

## 2021-11-04 ENCOUNTER — Inpatient Hospital Stay: Payer: 59

## 2021-11-04 ENCOUNTER — Inpatient Hospital Stay: Payer: 59 | Attending: Oncology | Admitting: Oncology

## 2021-11-04 ENCOUNTER — Other Ambulatory Visit: Payer: Self-pay

## 2021-11-04 VITALS — BP 144/84 | HR 100 | Temp 97.9°F | Resp 18 | Ht 68.0 in | Wt 206.0 lb

## 2021-11-04 DIAGNOSIS — C787 Secondary malignant neoplasm of liver and intrahepatic bile duct: Secondary | ICD-10-CM | POA: Diagnosis present

## 2021-11-04 DIAGNOSIS — C20 Malignant neoplasm of rectum: Secondary | ICD-10-CM | POA: Insufficient documentation

## 2021-11-04 LAB — CEA (ACCESS): CEA (CHCC): 1.63 ng/mL (ref 0.00–5.00)

## 2021-11-04 NOTE — Progress Notes (Signed)
[place] OFFICE PROGRESS NOTE   Diagnosis: Rectal cancer  INTERVAL HISTORY:   Norma Colon returns as scheduled.  She continues to have constipation.  She is followed by Dr. Bryan Lemma.  Medical therapy has not helped.  She continues to have pain in the right low abdomen and flank.  She is followed by Dr. Alyson Ingles.  She underwent restaging CTs on 10/13/2021.  There is no evidence of progressive metastatic disease.  Stable ablation defect in the liver.    She reports recent "dizzy "spells.   Objective:  Vital signs in last 24 hours:  Blood pressure (!) 144/84, pulse 100, temperature 97.9 F (36.6 C), temperature source Oral, resp. rate 18, height _0  (1.727 m), weight 206 lb (93.4 kg), SpO2 98 %.    Lymphatics: No cervical, supraclavicular, axillary, or inguinal nodes Resp: Lungs clear bilaterally Cardio: Regular rate and rhythm GI: No hepatosplenomegaly, no mass Vascular: No leg edema  Portacath/PICC-without erythema  Lab Results:  Lab Results  Component Value Date   WBC 4.8 08/29/2021   HGB 10.7 (L) 08/29/2021   HCT 33.1 (L) 08/29/2021   MCV 93.5 08/29/2021   PLT 233 08/29/2021   NEUTROABS 2.6 08/26/2021    CMP  Lab Results  Component Value Date   NA 139 08/29/2021   K 3.9 08/29/2021   CL 108 08/29/2021   CO2 23 08/29/2021   GLUCOSE 93 08/29/2021   BUN 17 08/29/2021   CREATININE 1.01 (H) 08/29/2021   CALCIUM 9.1 08/29/2021   PROT 7.3 08/26/2021   ALBUMIN 3.1 (L) 08/26/2021   AST 36 08/26/2021   ALT 29 08/26/2021   ALKPHOS 78 08/26/2021   BILITOT 0.5 08/26/2021   GFRNONAA >60 08/29/2021   GFRAA >60 06/17/2020    Lab Results  Component Value Date   CEA1 6.85 (H) 06/17/2021   CEA 2.30 09/18/2021    Lab Results  Component Value Date   INR 1.1 07/15/2021   LABPROT 14.5 07/15/2021    Imaging:  No results found.  Medications: I have reviewed the patient's current medications.   Assessment/Plan:  Rectal cancer Colonoscopy  05/17/2018 revealed a partially obstructing mass beginning at 10 cm from the anal verge, biopsy confirmed invasive moderately differentiated adenocarcinoma CTs 05/24/2018-mass beginning at proximal he 7.6 cm from the anal verge, "20 "perirectal lymph nodes, no evidence of metastatic disease Pelvic MRI 06/01/2018- tumor measured at 10.8 cm from the anal sphincter, T3b, N1-2 left perirectal lymph nodes, each measuring 5 mm Radiation and Xeloda initiated 06/13/2018, completed 07/20/2018 Low anterior resection 09/16/2018,ypT3,ypN1c, 1 cm tumor, partial response-score 2, 0/13 lymph nodes positive, 2 tumor deposits, MSI-stable, no loss of mismatch repair protein expression Cycle 1 CAPOX 10/17/2018 Cycle 2 CAPOX 11/07/2018 (oxaliplatin dose reduced due to neutropenia) Cycle 3 CAPOX 11/28/2018 Cycle 4 CAPOX 12/19/2018 CTs 05/29/2019-no evidence of metastatic disease, severe right hydronephrosis, left thyroid nodule CTs 04/11/2020-subtle nodular lesion in the inferior right liver suspicious for a new metastasis, status post right ureteral resection and psoas hitch/flap, resolution of hydronephrosis, sacral insufficiency fractures bilaterally MRI liver 05/08/2020-2.1 cm segment 6 lesion suspicious for a metastasis, 4 mm suspicious segment 3 lesion 4 mm segment 4B lesion consistent with a hemangioma, no abdominal lymphadenopathy, cholelithiasis Ablation of segment 6 and segment 3 lesions 06/19/2020, pathology from segment 6 biopsy confirmed metastatic adenocarcinoma-microsatellite stable, tumor mutation burden 3, K-ras wild type, NRAS wild-type, BRAF [id number] MRI abdomen 08/13/2020-ablation defects in segment 6 and segment 3, tiny foci of restricted diffusion with arterial phase enhancement  at the junctions of segment 4A and segment 4B, and segment 5 concerning for metastatic disease CTs 09/09/2020-stable ablation sites at segment 6 and segment 2. Small focus of hyperenhancement measuring 7 mm in the inferior right liver segment 6  anterior to the ablation site, and in the anterior right liver segment 5/8-unchanged compared to MRI and concerning for metastatic disease, no other evidence of metastatic disease. Scattered irregular groundglass opacities in the posterior right upper lobe Cycle 1 FOLFIRI/Avastin 09/23/2020 Treatment held 10/08/2020 due to neutropenia Cycle 2 FOLFIRI/Avastin 10/14/2020, Udenyca Cycle 3 FOLFIRI/Avastin 10/30/2020, Udenyca Cycle 4 FOLFIRI/Avastin 11/14/2020, Udenyca Cycle 5 FOLFIRI/Avastin 11/28/2020, Udenyca CTs 12/11/2020-no evidence of metastatic disease in the chest, stable liver ablation sites, stable hypervascular lesion in segment 4A, previously described enhancing lesion in the right hepatic lobe not well appreciated, no new lesions\ Real-time ultrasound evaluation by Dr. Laurence Ferrari 12/18/2020-no lesions are visible sonographically, therefore not a candidate for ablation Upper quadrant ultrasound 02/25/2021-no new or progressive hepatic metastases, prior ablation defects noted CTs 06/17/2021-unchanged 1.5 cm mixed attenuation left hepatic lesion, stable heterogenous lesion in the posterior right liver, no evidence of new metastatic disease-for review revealed 1.7 cm nodular lesion at the superior margin of the ablation site in the right liver concerning for recurrent disease CEA mildly elevated 06/17/2021 Ultrasound-guided biopsy of lesion followed by ablation encompassing the entirety of the tumor in the right liver, segment 6,, repeat CT revealed adequate ablation of the region of tumor, 07/30/2021, biopsy adenocarcinoma consistent with metastatic rectal adenocarcinoma CEA normal 08/08/2021 CT renal stone study 08/21/2021-microwave ablation defect in the right liver CT renal stone study 08/24/2021-decrease size of right liver hypodensity at site of prior microwave ablation CTs 10/13/2021-stable appearance of ablation defect in the liver, no evidence of metastatic disease History of Tobacco use 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 Alyson Ingles, urology Migraine headaches Restless legs Arm pain during the oxaliplatin infusion and for 1 week after cycle 1.  The oxaliplatin will be further diluted and infusion time extended getting with cycle 2. Neutropenia secondary to chemotherapy.  Oxaliplatin dose reduced beginning with cycle 2. 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 Alyson Ingles, surgical reimplantation of the right ureter on 08/03/2019, right ureter stent removed 09/14/2019 Thyroid nodules-new versus enlarged left thyroid nodule CT 05/29/2019, thyroid ultrasound 06/20/2019-multiple nodules, 1 right-sided nodule met criteria for ultrasound follow-up Ultrasound 05/08/2020-multinodular goiter, stable nodules in the isthmus and right thyroid meeting criteria for 1 year follow-up, additional stable nodules do not meet criteria for follow-up 12.  Right ureter injury following Foley catheter placement 07/30/2021-pyelogram confirmed extravasation of urine, placement of right ureter stent and Foley catheter 07/31/2021 Right ureter stent removed 09/04/2021 13.  Admission with urosepsis 08/24/2021     Disposition: Norma Colon is in clinical remission from rectal cancer.  The restaging CTs earlier this month reveal no evidence of metastatic disease.  The CEA is normal.  She will be scheduled for follow-up imaging by Dr. Laurence Ferrari in approximately 3 months.  Norma Colon would like to have the Port-A-Cath removed.  She understands she may need a Port-A-Cath in the future if she has disease progression. She will return for an office visit in 3 months.  Betsy Coder,  MD  11/04/2021  3:26 PM

## 2021-11-05 ENCOUNTER — Other Ambulatory Visit (INDEPENDENT_AMBULATORY_CARE_PROVIDER_SITE_OTHER): Payer: 59

## 2021-11-05 ENCOUNTER — Ambulatory Visit (INDEPENDENT_AMBULATORY_CARE_PROVIDER_SITE_OTHER): Payer: 59 | Admitting: Gastroenterology

## 2021-11-05 ENCOUNTER — Encounter: Payer: Self-pay | Admitting: Gastroenterology

## 2021-11-05 VITALS — BP 134/86 | HR 88 | Ht 68.0 in | Wt 208.0 lb

## 2021-11-05 DIAGNOSIS — K921 Melena: Secondary | ICD-10-CM | POA: Diagnosis not present

## 2021-11-05 DIAGNOSIS — C189 Malignant neoplasm of colon, unspecified: Secondary | ICD-10-CM

## 2021-11-05 DIAGNOSIS — K5909 Other constipation: Secondary | ICD-10-CM

## 2021-11-05 DIAGNOSIS — C20 Malignant neoplasm of rectum: Secondary | ICD-10-CM

## 2021-11-05 NOTE — Patient Instructions (Addendum)
If you are age 59 or older, your body mass index should be between 23-30. Your Body mass index is 31.63 kg/m. If this is out of the aforementioned range listed, please consider follow up with your Primary Care Provider.  If you are age 23 or younger, your body mass index should be between 19-25. Your Body mass index is 31.63 kg/m. If this is out of the aformentioned range listed, please consider follow up with your Primary Care Provider.   __________________________________________________________  The [place] [facility] providers would like to encourage you to use [facility] to communicate with providers for non-urgent requests or questions.  Due to long hold times on the telephone, sending your provider a message by [facility] may be a faster and more efficient way to get a response.  Please allow 48 business hours for a response.  Please remember that this is for non-urgent requests.    Please go to the lab on the 2nd floor [address] before you leave the office today.   Take Miralax 1 capful mixed in 8 ounces of water at bed time for constipation as tolerated.   Due to recent changes in healthcare laws, you may see the results of your imaging and laboratory studies on MyChart before your provider has had a chance to review them.  We understand that in some cases there may be results that are confusing or concerning to you. Not all laboratory results come back in the same time frame and the provider may be waiting for multiple results in order to interpret others.  Please give [place] 48 hours in order for your provider to thoroughly review all the results before contacting the office for clarification of your results.    Thank you for choosing me and [facility].  Vito Cirigliano, D.O.

## 2021-11-05 NOTE — Progress Notes (Signed)
Chief Complaint:    Rectal bleeding, constipation, medication management, colon cancer surveillance  GI History: 61 year old female with rectal cancer diagnosed on colonoscopy in 05/2018.  Initial CT and MRI without metastatic disease. - Treated with radiation and Xeloda followed by low anterior resection on 09/16/2018 with 0/13 lymph nodes positive (MSI stable, no loss of MMR), then CAPOX which completed in 12/2018. - CT in 05/2019 without metastatic disease. - CT in 04/2020 with subtle nodular lesion in inferior right liver suspicious for new metastasis - MRI liver 05/2020: 2.1 cm segment 6 and 4 mm segment 3 lesions suspicious for metastasis, hemangiomas - 06/2020: Ablation of segment 6 and segment 3 lesions (biopsy confirmed metastatic adenocarcinoma) - MRI abdomen 08/2020: Ablation defects in segment 6 and segment 3, new lesion at junction of segment 4A/4B and segment 5 concerning for metastasis - CT 09/2020: Stable ablation sites, unchanged sites concerning for metastasis but no new metastatic disease - 09/2020-11/2020: FOLFIRI/Avastin, Ellen Henri - CT 12/2020: Stable hypervascular lesion in segment 4A, otherwise no areas of metastasis - RUQ [place] 02/2021: No new or progressive hepatic metastasis - CT 06/2021: Unchanged 1.5 cm mixed attenuation left hepatic lesion, 1.7 cm nodular lesion at superior margin of ablation site concerning for recurrent disease - 07/2021: Ultrasound-guided 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  CEA normal - 07/2021: Microwave ablation - 08/2021: CT renal protocol with reduced right liver ablation site - 10/2021: CT C/A/P: Stable post ablation defects in the liver.  No new signs of metastatic disease   Additionally had right UPJ stone initially seen on CT in 05/2018, with migration on CT in 11/2018 with right hydroureteronephrosis requiring right JJ stent in 11/2018 followed by lithotripsy and stent replacement in 12/2018.  Right  hydronephrosis on repeat CT in 05/2019 with surgical reimplantation of right ureter on 08/03/2019, right ureter stent removal on 09/14/2019.   Additionally, history of constipation.  Linzess was efficacious but cost prohibitive.  Amitiza started in 07/2021.   Endoscopic history: -Colonoscopy (05/17/2018): 7 cm partially obstructing mass in the proximal rectum, located 10 cm from anal verge (rectal adenocarcinoma).  Fair prep -Colonoscopy (05/19/2018): Partial obstructing mass again noted in proximal rectum, tattoo placed.  Benign rectal polyps, otherwise normal.  Skin tags.  Normal TI -Colonoscopy (12/15/2019): Patent healthy-appearing anastomosis, single diverticulum in the ascending colon, internal hemorrhoids, otherwise normal  HPI:     Patient is a 61 y.o. female presenting to the Gastroenterology Clinic for follow-up.  Last seen by me on 10/07/2021.  Plan at that time was for repeat colonoscopy to evaluate for metachronous colon cancer and continued surveillance.  Discussed her case with her Urologist and no barrier to proceeding with colonoscopy, which is scheduled for later this month.  Today, her main issue is intermittent BRBPR, 2-3 times over last 2 weeks or so. On tissue paper only. No dyschezia. No abdominal pain.   For her chronic constipation, Linzess was efficacious, but cost prohibitive.  Amitiza less effective.  At last appointment, she was given Motegrity samples which improved symptoms, but medication denied by her insurance.  Trulance also not covered by insurance. Back to using Amitiza again since out of other meds now.   Was seen in follow-up by Dr. Ammie Dalton yesterday.  Restaging CTs last month without evidence of progressive metastatic disease.  CEA normal.  Plan for follow-up imaging with Dr. Laurence Ferrari in 3 months.  Review of systems:     No chest pain, no SOB, no fevers, no urinary sx  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[facility]) oncologist-- dr  Benay Spice--  per last note in epic ,  clinical remission   dx 08/ 2019---- chemoradiation concurrent completed 07-20-2018;  s/p  low anterior resection 09-16-2018;   chemo 10-17-2018  ot 12-19-2018   Anxiety    Arthritis    Chemotherapy induced neutropenia ([place])    Depression    Headache    migraines    History of HPV infection    History of kidney stones    Hydronephrosis, right    Restless leg syndrome    Scoliosis    Sepsis ([place])    11-2018    Patient's surgical history, family medical history, social history, medications and allergies were all reviewed in Epic    Current Outpatient Medications  Medication Sig Dispense Refill   alprazolam (XANAX) 2 MG tablet Take 2 mg by mouth every morning.      buPROPion (WELLBUTRIN XL) 300 MG 24 hr tablet Take 300 mg by mouth every morning.      cyclobenzaprine (FLEXERIL) 10 MG tablet Take 20 mg by mouth at bedtime.     HYDROcodone-acetaminophen (NORCO) 10-325 MG tablet Take 1-2 tablets by mouth every 6 (six) hours as needed for moderate pain or severe pain (headache). Post-Operatively. 30 tablet 0   lubiprostone (AMITIZA) 24 MCG capsule Take 1 capsule (24 mcg total) by mouth 2 (two) times daily with a meal. 180 capsule 5   rOPINIRole (REQUIP) 1 MG tablet Take 1 mg by mouth at bedtime.     Sod Picosulfate-Mag Ox-Cit Acd (CLENPIQ) 10-3.5-12 MG-GM -GM/160ML SOLN Take 1 kit by mouth as directed. 320 mL 0   Prucalopride Succinate (MOTEGRITY) 2 MG TABS Take 1 tablet (2 mg total) by mouth daily. (Patient not taking: Reported on 11/05/2021) 30 tablet 5   No current facility-administered medications for this visit.    Physical Exam:     BP 134/86    Pulse 88    Ht 5' 8"  (1.727 m)    Wt 208 lb (94.3 kg)    LMP  (LMP Unknown)    SpO2 98%    BMI 31.63 kg/m   GENERAL:  Pleasant female in NAD PSYCH: : Cooperative, normal affect NEURO: Alert and oriented x 3, no focal neurologic deficits   IMPRESSION and PLAN:    1) Colon cancer (Rectal  Adenocarcinoma) - Repeat colonoscopy on 11/28/2021 for ongoing surveillance - Plan for extended 2-day bowel prep given chronic constipation.  Already has prep.  Provided with repeat instructions today. - Continue follow-up with Dr. Benay Spice and Dr. Laurence Ferrari as planned  2) Chronic constipation - Linzess efficacious, but not covered by insurance.  Filling out patient assistance forms today - Motegrity denied by her insurance - Amitiza less effective, but has some available.  We will continue that in the meantime - Start MiraLAX 1 cap/day and titrate to effect - Continue adequate hydration as currently doing - Plan for 2-day bowel prep for colonoscopy as above  3) History of right ureteral injury - Continue follow-up in the Urology clinic  4) Hematochezia - Intermittent, scant BRB on tissue paper - Evaluate for etiology time colonoscopy -If clinically significant hemorrhoids, can discuss role/utility of hemorrhoid band ligation         Sena Hoopingarner V Tila Millirons ,DO, FACG 11/05/2021, 1:51 PM

## 2021-11-06 LAB — CBC
HCT: 41.8 % (ref 36.0–46.0)
Hemoglobin: 13.7 g/dL (ref 12.0–15.0)
MCHC: 32.8 g/dL (ref 30.0–36.0)
MCV: 93.1 fl (ref 78.0–100.0)
Platelets: 270 10*3/uL (ref 150.0–400.0)
RBC: 4.5 Mil/uL (ref 3.87–5.11)
RDW: 14.3 % (ref 11.5–15.5)
WBC: 6.5 10*3/uL (ref 4.0–10.5)

## 2021-11-07 ENCOUNTER — Telehealth: Payer: Self-pay | Admitting: Gastroenterology

## 2021-11-07 NOTE — Telephone Encounter (Signed)
Spoke with the patient regarding her preps for the colonoscopy. Dr Bryan Lemma would like her to do both preps. Origional prep was suprep he would like that started one week prior to starting her prep of Clenpiq for her colonoscopy. Sent suprep instructions to the patient with a date of 11/19/2021 which is 7 days prior to starting her 2 day clenpiq prep. The patient verbalized understanding

## 2021-11-07 NOTE — Telephone Encounter (Signed)
Patient called stating that she wanted to speak with Dr. Vivia Ewing nurse, I asked for the reasoning and she stated medication. Then just stated that the nurse would know. Would not give me any information. Please advise.

## 2021-11-14 ENCOUNTER — Encounter: Payer: Self-pay | Admitting: Gastroenterology

## 2021-11-27 NOTE — Telephone Encounter (Signed)
Inbound call from patient would like a call back because her prep instructions told her she need 8 dulcolax tablets but instructed to only take 4. Would like to know when to take the other 4

## 2021-11-27 NOTE — Telephone Encounter (Signed)
Contacted the patient and she was very confused about her prep. She wanted to know what time to take the second 4 ducolax, she was told that she is to take them at 3:00pm today. She also asked about the second prep she needed to do besides her 2 day clenpiq, she was uncertain how to intergrate the suprep with her clenpiq.  I explained to her that she was to do the suprep 7 days ago 11/19/2021. That is what was discussed  per Dr De Hollingshead instructions. The patient never did the suprep on  2/15 and wanted to start it now today with her Clenpiq. I expressed to her to not take both of these preps together. It could cause various health problems: examples given depletion of sodium,  potassium, kidney problems ect. Patient was told to only do the 2 day clenpiq and do not use the suprep. The patient verbalized understanding.

## 2021-11-28 ENCOUNTER — Other Ambulatory Visit: Payer: Self-pay

## 2021-11-28 ENCOUNTER — Ambulatory Visit (AMBULATORY_SURGERY_CENTER): Payer: 59 | Admitting: Gastroenterology

## 2021-11-28 ENCOUNTER — Encounter: Payer: Self-pay | Admitting: Gastroenterology

## 2021-11-28 VITALS — BP 110/60 | HR 90 | Temp 98.2°F | Resp 13 | Ht 68.0 in

## 2021-11-28 DIAGNOSIS — Z85038 Personal history of other malignant neoplasm of large intestine: Secondary | ICD-10-CM | POA: Diagnosis not present

## 2021-11-28 DIAGNOSIS — C20 Malignant neoplasm of rectum: Secondary | ICD-10-CM

## 2021-11-28 DIAGNOSIS — K5909 Other constipation: Secondary | ICD-10-CM

## 2021-11-28 DIAGNOSIS — K640 First degree hemorrhoids: Secondary | ICD-10-CM

## 2021-11-28 DIAGNOSIS — K6289 Other specified diseases of anus and rectum: Secondary | ICD-10-CM

## 2021-11-28 MED ORDER — SODIUM CHLORIDE 0.9 % IV SOLN: 500.0000 mL | INTRAVENOUS | Status: DC

## 2021-11-28 NOTE — Patient Instructions (Signed)
Resume previous diet and medications.  Repeat colonoscopy in 5 years for surveillance.   YOU HAD AN ENDOSCOPIC PROCEDURE TODAY AT [facility]:   Refer to the procedure report that was given to you for any specific questions about what was found during the examination.  If the procedure report does not answer your questions, please call your gastroenterologist to clarify.  If you requested that your care partner not be given the details of your procedure findings, then the procedure report has been included in a sealed envelope for you to review at your convenience later.  YOU SHOULD EXPECT: Some feelings of bloating in the abdomen. Passage of more gas than usual.  Walking can help get rid of the air that was put into your GI tract during the procedure and reduce the bloating. If you had a lower endoscopy (such as a colonoscopy or flexible sigmoidoscopy) you may notice spotting of blood in your stool or on the toilet paper. If you underwent a bowel prep for your procedure, you may not have a normal bowel movement for a few days.  Please Note:  You might notice some irritation and congestion in your nose or some drainage.  This is from the oxygen used during your procedure.  There is no need for concern and it should clear up in a day or so.  SYMPTOMS TO REPORT IMMEDIATELY:  Following lower endoscopy (colonoscopy or flexible sigmoidoscopy):  Excessive amounts of blood in the stool  Significant tenderness or worsening of abdominal pains  Swelling of the abdomen that is new, acute  Fever of 100F or higher   For urgent or emergent issues, a gastroenterologist can be reached at any hour by calling [phone]. Do not use MyChart messaging for urgent concerns.    DIET:  We do recommend a small meal at first, but then you may proceed to your regular diet.  Drink plenty of fluids but you should avoid alcoholic beverages for 24 hours.  ACTIVITY:  You should plan to take it easy  for the rest of today and you should NOT DRIVE or use heavy machinery until tomorrow (because of the sedation medicines used during the test).    FOLLOW UP: Our staff will call the number listed on your records 48-72 hours following your procedure to check on you and address any questions or concerns that you may have regarding the information given to you following your procedure. If we do not reach you, we will leave a message.  We will attempt to reach you two times.  During this call, we will ask if you have developed any symptoms of COVID 19. If you develop any symptoms (ie: fever, flu-like symptoms, shortness of breath, cough etc.) before then, please call [phone].  If you test positive for Covid 19 in the 2 weeks post procedure, please call and report this information to [place].    If any biopsies were taken you will be contacted by phone or by letter within the next 1-3 weeks.  Please call us at [phone] if you have not heard about the biopsies in 3 weeks.    SIGNATURES/CONFIDENTIALITY: You and/or your care partner have signed paperwork which will be entered into your electronic medical record.  These signatures attest to the fact that that the information above on your After Visit Summary has been reviewed and is understood.  Full responsibility of the confidentiality of this discharge information lies with you and/or your care-partner.

## 2021-11-28 NOTE — Progress Notes (Signed)
Report to PACU, RN, vss, BBS= Clear.  

## 2021-11-28 NOTE — Progress Notes (Signed)
VS per CNW

## 2021-11-28 NOTE — Progress Notes (Signed)
GASTROENTEROLOGY PROCEDURE H&P NOTE   Primary Care Physician: Dian Queen, MD    Reason for Procedure:   Colon cancer surveillance  Plan:    Colonoscopy  Patient is appropriate for endoscopic procedure(s) in the ambulatory ([place]) setting.  The nature of the procedure, as well as the risks, benefits, and alternatives were carefully and thoroughly reviewed with the patient. Ample time for discussion and questions allowed. The patient understood, was satisfied, and agreed to proceed.     HPI: Norma Colon is a 61 y.o. female who presents for Colonoscopy for ongoing Colon CA surveillance along with recent BRBPR.  Patient was most recently seen in the Gastroenterology Clinic on 11/05/2021 by me.  No interval change in medical history since that appointment. Please refer to that note for full details regarding GI history and clinical presentation.   GI History: 61 year old female with rectal cancer diagnosed on colonoscopy in 05/2018.  Initial CT and MRI without metastatic disease. - Treated with radiation and Xeloda followed by low anterior resection on 09/16/2018 with 0/13 lymph nodes positive (MSI stable, no loss of MMR), then CAPOX which completed in 12/2018. - CT in 05/2019 without metastatic disease. - CT in 04/2020 with subtle nodular lesion in inferior right liver suspicious for new metastasis - MRI liver 05/2020: 2.1 cm segment 6 and 4 mm segment 3 lesions suspicious for metastasis, hemangiomas - 06/2020: Ablation of segment 6 and segment 3 lesions (biopsy confirmed metastatic adenocarcinoma) - MRI abdomen 08/2020: Ablation defects in segment 6 and segment 3, new lesion at junction of segment 4A/4B and segment 5 concerning for metastasis - CT 09/2020: Stable ablation sites, unchanged sites concerning for metastasis but no new metastatic disease - 09/2020-11/2020: FOLFIRI/Avastin, Ellen Henri - CT 12/2020: Stable hypervascular lesion in segment 4A, otherwise no areas of  metastasis - RUQ [place] 02/2021: No new or progressive hepatic metastasis - CT 06/2021: Unchanged 1.5 cm mixed attenuation left hepatic lesion, 1.7 cm nodular lesion at superior margin of ablation site concerning for recurrent disease - 07/2021: Ultrasound-guided 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  CEA normal - 07/2021: Microwave ablation - 08/2021: CT renal protocol with reduced right liver ablation site - 10/2021: CT C/A/P: Stable post ablation defects in the liver.  No new signs of metastatic disease   Additionally had right UPJ stone initially seen on CT in 05/2018, with migration on CT in 11/2018 with right hydroureteronephrosis requiring right JJ stent in 11/2018 followed by lithotripsy and stent replacement in 12/2018.  Right hydronephrosis on repeat CT in 05/2019 with surgical reimplantation of right ureter on 08/03/2019, right ureter stent removal on 09/14/2019.   Additionally, history of constipation.  Linzess was efficacious but cost prohibitive.  Amitiza started in 07/2021.   Endoscopic history: -Colonoscopy (05/17/2018): 7 cm partially obstructing mass in the proximal rectum, located 10 cm from anal verge (rectal adenocarcinoma).  Fair prep -Colonoscopy (05/19/2018): Partial obstructing mass again noted in proximal rectum, tattoo placed.  Benign rectal polyps, otherwise normal.  Skin tags.  Normal TI -Colonoscopy (12/15/2019): Patent healthy-appearing anastomosis, single diverticulum in the ascending colon, internal hemorrhoids, otherwise normal  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[facility]) oncologist-- dr Benay Spice--  per last note in epic ,  clinical remission   dx 08/ 2019---- chemoradiation concurrent completed 07-20-2018;  s/p  low anterior resection 09-16-2018;   chemo 10-17-2018  ot 12-19-2018   Anxiety    Arthritis    Chemotherapy induced neutropenia ([place])  Depression    Headache    migraines    History of HPV  infection    History of kidney stones    Hydronephrosis, right    Restless leg syndrome    Scoliosis    Sepsis ([place])    11-2018    Past Surgical History:  Procedure Laterality Date   COLONOSCOPY     COLPOSCOPY  04/04/2018   CYSTOSCOPY W/ RETROGRADES Right 07/31/2021   Procedure: CYSTOSCOPY WITH RETROGRADE PYELOGRAM, URETEROSCOPY AND STENT PLACEMENT;  Surgeon: Janith Lima, MD;  Location: [place];  Service: Urology;  Laterality: Right;   CYSTOSCOPY W/ URETERAL STENT PLACEMENT Right 09/04/2021   Procedure: CYSTOSCOPY WITH RETROGRADE PYELOGRAM WITH STENT REMOVAL;  Surgeon: Cleon Gustin, MD;  Location: [facility];  Service: Urology;  Laterality: Right;   CYSTOSCOPY WITH RETROGRADE PYELOGRAM, URETEROSCOPY AND STENT PLACEMENT Right 12/12/2018   Procedure: CYSTOSCOPY WITH RETROGRADE PYELOGRAM, URETEROSCOPY, STONE EXTRACTION AND STENT PLACEMENT;  Surgeon: Cleon Gustin, MD;  Location: [facility];  Service: Urology;  Laterality: Right;   CYSTOSCOPY WITH RETROGRADE PYELOGRAM, URETEROSCOPY AND STENT PLACEMENT Right 04/17/2019   Procedure: CYSTOSCOPY WITH RETROGRADE PYELOGRAM, BALLOON [place]  URETEROSCOPY AND STENT PLACEMENT;  Surgeon: Cleon Gustin, MD;  Location: [facility];  Service: Urology;  Laterality: Right;   CYSTOSCOPY WITH STENT PLACEMENT Right 11/21/2018   Procedure: CYSTOSCOPY, RIGHT RETROGRADE PYELOGRAM, WITH RIGHT URETERAL STENT PLACEMENT;  Surgeon: Cleon Gustin, MD;  [facility];  Service: Urology;  Laterality: Right;   IR IMAGING GUIDED PORT INSERTION  09/04/2020   IR NEPHROSTOMY PLACEMENT RIGHT  06/22/2019   IR PATIENT EVAL TECH 0-60 MINS  06/28/2019   IR RADIOLOGIST EVAL & MGMT  06/06/2020   IR RADIOLOGIST EVAL & MGMT  07/17/2020   IR RADIOLOGIST EVAL & MGMT  08/15/2020   IR RADIOLOGIST EVAL & MGMT  09/03/2020   IR RADIOLOGIST EVAL & MGMT  12/18/2020   IR RADIOLOGIST EVAL & MGMT  02/25/2021   IR RADIOLOGIST  EVAL & MGMT  06/23/2021   IR RADIOLOGIST EVAL & MGMT  08/12/2021   IR RADIOLOGIST EVAL & MGMT  10/15/2021   left forearm surgery due to calcium rock      RADIOLOGY WITH ANESTHESIA N/A 06/19/2020   Procedure: CT WITH ANESTHESIA MICROWAVE ABLATION;  Surgeon: Jacqulynn Cadet, MD;  [facility];  Service: Anesthesiology;  Laterality: N/A;   RADIOLOGY WITH ANESTHESIA N/A 07/30/2021   Procedure: CT MICROWAVE ABLATION OF THE LIVER;  Surgeon: Criselda Peaches, MD;  [facility];  Service: Radiology;  Laterality: N/A;   ROBOT ASSISTED PYELOPLASTY Right 08/03/2019   Procedure: XI ROBOTIC ASSISTED PYELOPLASTY WITH STENT PLACEMENT;  Surgeon: Cleon Gustin, MD;  [facility];  Service: Urology;  Laterality: Right;  3 HRS   SHOULDER ARTHROSCOPY W/ ROTATOR CUFF REPAIR Right    XI ROBOTIC ASSISTED LOWER ANTERIOR RESECTION N/A 09/16/2018   Procedure: XI ROBOTIC ASSISTED LOWER ANTERIOR RESECTION ERAS PATHWAY;  Surgeon: Leighton Ruff, MD;  [facility];  Service: General;  Laterality: N/A;    Prior to Admission medications   Medication Sig Start Date End Date Taking? Authorizing Provider  alprazolam Duanne Moron) 2 MG tablet Take 2 mg by mouth every morning.    Yes [provider]  buPROPion (WELLBUTRIN XL) 300 MG 24 hr tablet Take 300 mg by mouth every morning.    Yes [provider]  cyclobenzaprine (FLEXERIL) 10 MG tablet Take 20 mg by mouth at bedtime.  Yes [provider]  HYDROcodone-acetaminophen (NORCO) 10-325 MG tablet Take 1-2 tablets by mouth every 6 (six) hours as needed for moderate pain or severe pain (headache). Post-Operatively. 09/04/21  Yes McKenzie, Candee Furbish, MD  lubiprostone (AMITIZA) 24 MCG capsule Take 1 capsule (24 mcg total) by mouth 2 (two) times daily with a meal. 07/16/21 01/07/23 Yes Frankie Zito V, DO  Prucalopride Succinate (MOTEGRITY) 2 MG TABS Take 1 tablet (2 mg total) by mouth daily. 10/07/21  Yes Cephas Revard V, DO  rOPINIRole  (REQUIP) 1 MG tablet Take 1 mg by mouth at bedtime.   Yes [provider]    Current Outpatient Medications  Medication Sig Dispense Refill   alprazolam (XANAX) 2 MG tablet Take 2 mg by mouth every morning.      buPROPion (WELLBUTRIN XL) 300 MG 24 hr tablet Take 300 mg by mouth every morning.      cyclobenzaprine (FLEXERIL) 10 MG tablet Take 20 mg by mouth at bedtime.     HYDROcodone-acetaminophen (NORCO) 10-325 MG tablet Take 1-2 tablets by mouth every 6 (six) hours as needed for moderate pain or severe pain (headache). Post-Operatively. 30 tablet 0   lubiprostone (AMITIZA) 24 MCG capsule Take 1 capsule (24 mcg total) by mouth 2 (two) times daily with a meal. 180 capsule 5   Prucalopride Succinate (MOTEGRITY) 2 MG TABS Take 1 tablet (2 mg total) by mouth daily. 30 tablet 5   rOPINIRole (REQUIP) 1 MG tablet Take 1 mg by mouth at bedtime.     Current Facility-Administered Medications  Medication Dose Route Frequency Provider Last Rate Last Admin   0.9 %  sodium chloride infusion  500 mL Intravenous Continuous Sharnetta Gielow V, DO        Allergies as of 11/28/2021 - Review Complete 11/28/2021  Allergen Reaction Noted   Prednisone Other (See Comments) 08/21/2020   Codeine Other (See Comments) 05/04/2018    Family History  Problem Relation Age of Onset   COPD Father        smoker   Stomach cancer Father        spread to lungs and bones   Arthritis Mother    Heart attack Paternal Grandmother    Heart attack Paternal Grandfather    Heart attack Maternal Grandmother    Stroke Maternal Grandmother    Colon cancer Neg Hx    Rectal cancer Neg Hx    Esophageal cancer Neg Hx     Social History   Socioeconomic History   Marital status: Divorced    Spouse name: Not on file   Number of children: 1   Years of education: Not on file   Highest education level: Not on file  Occupational History   Occupation: Land  Tobacco Use   Smoking status: Former    Packs/day:  1.00    Years: 30.00    Pack years: 30.00    Types: Cigarettes    Quit date: 09/16/2018    Years since quitting: 3.2   Smokeless tobacco: Never  Vaping Use   Vaping Use: Never used  Substance and Sexual Activity   Alcohol use: Yes    Comment: social   Drug use: Never   Sexual activity: Yes  Other Topics Concern   Not on file  Social History Narrative   Not on file   Social Determinants of Health   Financial Resource Strain: Not on file  Food Insecurity: Not on file  Transportation Needs: Not on file  Physical Activity:  Not on file  Stress: Not on file  Social Connections: Not on file  Intimate Partner Violence: Not on file    Physical Exam: Vital signs in last 24 hours: _0  131/84    Pulse 99    Temp 98.2 F (36.8 C)    Ht _1  (1.727 m)    LMP  (LMP Unknown)    SpO2 98%    BMI 31.63 kg/m  GEN: NAD EYE: Sclerae anicteric ENT: MMM CV: Non-tachycardic Pulm: CTA b/l GI: Soft, NT/ND NEURO:  Alert & Oriented x 3   Gerrit Heck, DO [facility]   11/28/2021 8:52 AM

## 2021-11-28 NOTE — Op Note (Signed)
[place] Patient Name: Norma Colon Procedure Date: 11/28/2021 8:53 AM MRN: [id number] Endoscopist: Gerrit Heck , MD Age: 61 Referring MD:  Date of Birth: [birth year] Gender: Female Account #: [id number] Procedure:                Colonoscopy Indications:              High risk colon cancer surveillance: Personal                            history of colon cancer Medicines:                Monitored Anesthesia Care Procedure:                Pre-Anesthesia Assessment:                           - Prior to the procedure, a History and Physical                            was performed, and patient medications and                            allergies were reviewed. The patient's tolerance of                            previous anesthesia was also reviewed. The risks                            and benefits of the procedure and the sedation                            options and risks were discussed with the patient.                            All questions were answered, and informed consent                            was obtained. Prior Anticoagulants: The patient has                            taken no previous anticoagulant or antiplatelet                            agents. ASA Grade Assessment: II - A patient with                            mild systemic disease. After reviewing the risks                            and benefits, the patient was deemed in                            satisfactory condition to undergo the procedure.  After obtaining informed consent, the colonoscope                            was passed under direct vision. Throughout the                            procedure, the patient's blood pressure, pulse, and                            oxygen saturations were monitored continuously. The                            Olympus PCF-H190DL ([id number]) Colonoscope was                            introduced through the anus and advanced to  the the                            cecum, identified by appendiceal orifice and                            ileocecal valve. The colonoscopy was performed                            without difficulty. The patient tolerated the                            procedure well. The quality of the bowel                            preparation was good. The ileocecal valve,                            appendiceal orifice, and rectum were photographed. Scope In: 9:02:09 AM Scope Out: 9:16:14 AM Scope Withdrawal Time: 0 hours 11 minutes 8 seconds  Total Procedure Duration: 0 hours 14 minutes 5 seconds  Findings:                 The perianal and digital rectal examinations were                            normal.                           There was evidence of a prior end-to-end                            colo-colonic anastomosis in the rectum. This was                            patent and was characterized by healthy appearing                            mucosa. The anastomosis was traversed.  A tattoo was seen in the rectum. The tattoo site                            appeared normal.                           There was a small area of mild erythema in the                            distal rectum. No active bleeding and no erosions                            or ulceration. Otherwise, the remainder of the                            colon was normal.                           Non-bleeding internal hemorrhoids were found during                            retroflexion. The hemorrhoids were small and Grade                            I (internal hemorrhoids that do not prolapse). Complications:            No immediate complications. Estimated Blood Loss:     Estimated blood loss: none. Impression:               - Patent end-to-end colo-colonic anastomosis,                            characterized by healthy appearing mucosa.                           - A tattoo was seen in the  rectum. The tattoo site                            appeared normal.                           - There was a small area of mild erythema in the                            distal rectum, possibly due to mild radiation                            proctitis vs prep artifact. No active bleeding and                            no erosions or ulceration.                           - Otherwise, the remainder of the colon was normal  appearing.                           - Non-bleeding internal hemorrhoids.                           - No specimens collected. Recommendation:           - Repeat colonoscopy in 5 years for surveillance.                           - Return to GI office PRN.                           - Patient has a contact number available for                            emergencies. The signs and symptoms of potential                            delayed complications were discussed with the                            patient. Return to normal activities tomorrow.                            Written discharge instructions were provided to the                            patient.                           - Resume previous diet.                           - Continue present medications. Gerrit Heck, MD 11/28/2021 9:26:27 AM

## 2021-12-01 ENCOUNTER — Other Ambulatory Visit: Payer: Self-pay

## 2021-12-01 MED ORDER — LUBIPROSTONE 24 MCG PO CAPS: 24.0000 ug | ORAL_CAPSULE | Freq: Two times a day (BID) | ORAL | 5 refills | Status: DC

## 2021-12-02 ENCOUNTER — Telehealth: Payer: Self-pay | Admitting: *Deleted

## 2021-12-02 ENCOUNTER — Telehealth: Payer: Self-pay

## 2021-12-02 NOTE — Telephone Encounter (Signed)
Left message on follow up call. 

## 2021-12-02 NOTE — Telephone Encounter (Signed)
Second attempt, unable to leave VM because mailbox is full.

## 2021-12-18 ENCOUNTER — Other Ambulatory Visit: Payer: Self-pay | Admitting: Oncology

## 2021-12-18 ENCOUNTER — Other Ambulatory Visit: Payer: Self-pay | Admitting: Interventional Radiology

## 2021-12-22 ENCOUNTER — Other Ambulatory Visit: Payer: Self-pay | Admitting: Interventional Radiology

## 2021-12-22 DIAGNOSIS — C787 Secondary malignant neoplasm of liver and intrahepatic bile duct: Secondary | ICD-10-CM

## 2021-12-30 ENCOUNTER — Encounter: Payer: Self-pay | Admitting: Oncology

## 2022-01-06 ENCOUNTER — Other Ambulatory Visit: Payer: Self-pay | Admitting: Oncology

## 2022-01-06 DIAGNOSIS — C189 Malignant neoplasm of colon, unspecified: Secondary | ICD-10-CM

## 2022-01-08 ENCOUNTER — Encounter: Payer: Self-pay | Admitting: Oncology

## 2022-01-12 ENCOUNTER — Encounter: Payer: Self-pay | Admitting: Oncology

## 2022-01-12 ENCOUNTER — Other Ambulatory Visit: Payer: 59

## 2022-01-12 ENCOUNTER — Ambulatory Visit
Admission: RE | Admit: 2022-01-12 | Discharge: 2022-01-12 | Disposition: A | Discharge status: Home / Self Care | Payer: 59 | Source: Ambulatory Visit | Attending: Oncology | Admitting: Oncology

## 2022-01-12 DIAGNOSIS — C189 Malignant neoplasm of colon, unspecified: Secondary | ICD-10-CM

## 2022-01-12 MED ORDER — GADOBENATE DIMEGLUMINE 529 MG/ML IV SOLN
20.0000 mL | Freq: Once | INTRAVENOUS | Status: AC | PRN
  Administered 2022-01-12: 20 mL via INTRAVENOUS

## 2022-01-13 ENCOUNTER — Ambulatory Visit
Admission: RE | Admit: 2022-01-13 | Discharge: 2022-01-13 | Disposition: A | Discharge status: Home / Self Care | Payer: 59 | Source: Ambulatory Visit | Attending: Interventional Radiology | Admitting: Interventional Radiology

## 2022-01-13 ENCOUNTER — Encounter: Payer: Self-pay | Admitting: Gastroenterology

## 2022-01-13 DIAGNOSIS — C189 Malignant neoplasm of colon, unspecified: Secondary | ICD-10-CM

## 2022-01-13 HISTORY — PX: IR RADIOLOGIST EVAL & MGMT: IMG5224

## 2022-01-13 NOTE — Progress Notes (Signed)
? ? ?Chief Complaint: ?Patient was consulted remotely today (TeleHealth) for rectal cancer metastatic to the liver at the request of Ruqaya Strauss K.   ? ?Referring Physician(s): ?Julieanne Manson, MD ? ?History of Present Illness: ?Norma Colon is a 61 y.o. female who initially presented at the kind request of Dr. 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  Norma Colon was initially diagnosed with a partially obstructing rectal mass in August 2019.  She underwent neoadjuvant combined radiation and chemotherapy (Xeloda) from September through October 2019 followed by a low anterior resection in December 2019.   ?  ?Subsequently, she underwent 4 cycles of additional adjuvant chemotherapy with CAPOX completed in March 2020.  Subsequent surveillance imaging initially detected no evidence of metastatic disease.  Unfortunately, CT imaging from July 2021 demonstrated a subtle lesion in hepatic segment 6 concerning for metastatic disease.  A follow-up MRI dated 05/08/2020 confirms a 2.1 cm solid lesion in hepatic segment 6 as well as a concerning for millimeter lesion in the posterior aspect of segment 3.  Both lesions are concerning for metastatic disease. ?  ?She underwent concurrent ultrasound-guided biopsy and microwave ablation of the 2 liver lesions on 06/19/2020.   ?  ?Follow-up MRI imaging from 08/13/20 showed an excellent treatment effect at the sites of the 2 ablated lesions, but 2 new lesions highly concerning for additional metastatic foci.  Norma Colon therefore went back for additional chemotherapy and has now completed another cycle of FOLFIRI/Avastin on 11/28/2020.  She reported increased malaise following this cycle of chemotherapy.  She had diarrhea and mild nosebleeding. ?  ?Restaging CT scans of the chest, abdomen and pelvis dated 12/12/2020 demonstrates no evidence of new metastatic disease or disease outside the liver.  The previously  identified tiny hypervascular lesions in segment 4A and segment 6 are less conspicuous and perhaps slightly smaller consistent with interval treatment response.   ?  ?US imaging performed by me on  12/18/20 and again on 02/25/21 showed no visible hepatic lesions.  ?  ?Re-staging CT scans 06/17/21 - New 1.7 cm lesion adjacent to the posterosuperior margin of the segment 6 ablation defect concerning for recurrent disease.  ?  ?She underwent CT-guided biopsy and repeat microwave ablation of the recurrent tumor on 07/30/2021.  Unfortunately, at that time she sustained an inadvertent injury to the right ureter presumably due to unfortunate Foley catheter placement with in the reimplanted ureter.  Fortunately, we were able to diagnose this within the first day and urology was able to traverse the injury and place a double-J ureteral stent.  She also developed acute kidney injury with elevated creatinine due to to peritoneal reabsorption of the leaking urine. ? ?CT CAP 10/13/2021: Post ablation defects in the liver without evidence of progressive disease or new metastatic disease in the chest, abdomen or pelvis. ? ?MRI Abd 01/12/22: Complete diagnostic read not yet available.  However, I have reviewed the images and detect no evidence of residual, recurrent or new disease.  Final read pending. ?  ?Overall, Norma Colon is doing well.  ? ?Past Medical History:  ?Diagnosis Date  ? 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) oncologist-- dr Benay Spice--  per last note in epic ,  clinical remission  ? dx 08/ 2019---- chemoradiation concurrent completed 07-20-2018;  s/p  low anterior resection 09-16-2018;   chemo 10-17-2018  ot 12-19-2018  ? Anxiety   ? Arthritis   ? Chemotherapy induced neutropenia ([facility])   ? Depression   ?  Headache   ? migraines   ? History of HPV infection   ? History of kidney stones   ? Hydronephrosis, right   ? Restless leg syndrome   ? Scoliosis   ? Sepsis ([place])   ? 11-2018  ? ? ?Past Surgical History:  ?Procedure Laterality  Date  ? COLONOSCOPY    ? COLPOSCOPY  04/04/2018  ? CYSTOSCOPY W/ RETROGRADES Right 07/31/2021  ? Procedure: CYSTOSCOPY WITH RETROGRADE PYELOGRAM, URETEROSCOPY AND STENT PLACEMENT;  Surgeon: Janith Lima, MD;  Location: [employer] [facility];  Service: Urology;  Laterality: Right;  ? CYSTOSCOPY W/ URETERAL STENT PLACEMENT Right 09/04/2021  ? Procedure: CYSTOSCOPY WITH RETROGRADE PYELOGRAM WITH STENT REMOVAL;  Surgeon: Cleon Gustin, MD;  [facility];  Service: Urology;  Laterality: Right;  ? CYSTOSCOPY WITH RETROGRADE PYELOGRAM, URETEROSCOPY AND STENT PLACEMENT Right 12/12/2018  ? Procedure: CYSTOSCOPY WITH RETROGRADE PYELOGRAM, URETEROSCOPY, STONE EXTRACTION AND STENT PLACEMENT;  Surgeon: Cleon Gustin, MD;  Location: [facility];  Service: Urology;  Laterality: Right;  ? CYSTOSCOPY WITH RETROGRADE PYELOGRAM, URETEROSCOPY AND STENT PLACEMENT Right 04/17/2019  ? Procedure: CYSTOSCOPY WITH RETROGRADE PYELOGRAM, BALLOON [place]  URETEROSCOPY AND STENT PLACEMENT;  Surgeon: Cleon Gustin, MD;  Location: [facility];  Service: Urology;  Laterality: Right;  ? CYSTOSCOPY WITH STENT PLACEMENT Right 11/21/2018  ? Procedure: CYSTOSCOPY, RIGHT RETROGRADE PYELOGRAM, WITH RIGHT URETERAL STENT PLACEMENT;  Surgeon: Cleon Gustin, MD;  [facility];  Service: Urology;  Laterality: Right;  ? IR IMAGING GUIDED PORT INSERTION  09/04/2020  ? IR NEPHROSTOMY PLACEMENT RIGHT  06/22/2019  ? IR PATIENT EVAL TECH 0-60 MINS  06/28/2019  ? IR RADIOLOGIST EVAL & MGMT  06/06/2020  ? IR RADIOLOGIST EVAL & MGMT  07/17/2020  ? IR RADIOLOGIST EVAL & MGMT  08/15/2020  ? IR RADIOLOGIST EVAL & MGMT  09/03/2020  ? IR RADIOLOGIST EVAL & MGMT  12/18/2020  ? IR RADIOLOGIST EVAL & MGMT  02/25/2021  ? IR RADIOLOGIST EVAL & MGMT  06/23/2021  ? IR RADIOLOGIST EVAL & MGMT  08/12/2021  ? IR RADIOLOGIST EVAL & MGMT  10/15/2021  ? left forearm surgery due to calcium rock     ? RADIOLOGY WITH ANESTHESIA N/A  06/19/2020  ? Procedure: CT WITH ANESTHESIA MICROWAVE ABLATION;  Surgeon: Jacqulynn Cadet, MD;  [facility];  Service: Anesthesiology;  Laterality: N/A;  ? RADIOLOGY WITH ANESTHESIA N/A 07/30/2021  ? Procedure: CT MICROWAVE ABLATION OF THE LIVER;  Surgeon: Criselda Peaches, MD;  [facility];  Service: Radiology;  Laterality: N/A;  ? ROBOT ASSISTED PYELOPLASTY Right 08/03/2019  ? Procedure: XI ROBOTIC ASSISTED PYELOPLASTY WITH STENT PLACEMENT;  Surgeon: Cleon Gustin, MD;  [facility];  Service: Urology;  Laterality: Right;  3 HRS  ? SHOULDER ARTHROSCOPY W/ ROTATOR CUFF REPAIR Right   ? XI ROBOTIC ASSISTED LOWER ANTERIOR RESECTION N/A 09/16/2018  ? Procedure: XI ROBOTIC ASSISTED LOWER ANTERIOR RESECTION ERAS PATHWAY;  Surgeon: Leighton Ruff, MD;  [facility];  Service: General;  Laterality: N/A;  ? ? ?Allergies: ?Prednisone and Codeine ? ?Medications: ?Prior to Admission medications   ?Medication Sig Start Date End Date Taking? Authorizing Provider  ?alprazolam (XANAX) 2 MG tablet Take 2 mg by mouth every morning.     [provider]  ?buPROPion (WELLBUTRIN XL) 300 MG 24 hr tablet Take 300 mg by mouth every morning.     [provider]  ?cyclobenzaprine (FLEXERIL) 10 MG tablet Take 20 mg by mouth at  bedtime.    [provider]  ?HYDROcodone-acetaminophen (NORCO) 10-325 MG tablet Take 1-2 tablets by mouth every 6 (six) hours as needed for moderate pain or severe pain (headache). Post-Operatively. 09/04/21   McKenzie, Candee Furbish, MD  ?lubiprostone (AMITIZA) 24 MCG capsule Take 1 capsule (24 mcg total) by mouth 2 (two) times daily with a meal. 12/01/21 05/25/23  Cirigliano, Dominic Pea, DO  ?Prucalopride Succinate (MOTEGRITY) 2 MG TABS Take 1 tablet (2 mg total) by mouth daily. 10/07/21   Cirigliano, Vito V, DO  ?rOPINIRole (REQUIP) 1 MG tablet Take 1 mg by mouth at bedtime.    [provider]  ?  ? ?Family History  ?Problem Relation Age of Onset  ? COPD Father    ?     smoker  ? Stomach cancer Father   ?     spread to lungs and bones  ? Arthritis Mother   ? Heart attack Paternal Grandmother   ? Heart attack Paternal Grandfather   ? Heart attack Maternal Grandmoth

## 2022-01-14 ENCOUNTER — Telehealth: Payer: Self-pay

## 2022-01-14 NOTE — Telephone Encounter (Signed)
This was addressed in another MyChart message on 4/11. ?

## 2022-01-14 NOTE — Telephone Encounter (Signed)
Pt verbalized understanding.

## 2022-01-14 NOTE — Telephone Encounter (Signed)
-----   Message from Ladell Pier, MD sent at 01/13/2022  6:48 PM EDT ----- ?Please call patient, MRI shows no evidence of active cancer, f/u as scheduled ? ?

## 2022-01-19 ENCOUNTER — Other Ambulatory Visit: Payer: Self-pay

## 2022-01-20 ENCOUNTER — Telehealth: Payer: Self-pay

## 2022-01-20 NOTE — Telephone Encounter (Signed)
Spoke with pt and gave pt message from Dr. Bryan Lemma. Pt verbalized understanding and had no other concerns at end of call.  ?

## 2022-01-20 NOTE — Telephone Encounter (Signed)
-----   Message from Lavena Bullion, DO sent at 01/15/2022  4:13 PM EDT ----- ?MRI dated 01/12/2022 results reviewed.  Patient was also seen by Dr. Laurence Ferrari in IR earlier this week (01/13/2022).  Note reviewed and MRI results reviewed with patient that day; imaging demonstrates no evidence of residual, recurrent, or new disease.  Plan is for continued active surveillance with repeat MRI and IR clinic f/u visit in 3 months. ? ?----- Message ----- ?From: Tania Ade, RN ?Sent: 01/14/2022   9:01 AM EDT ?To: Lavena Bullion, DO ? ?Patient requested you see this MRI ? ?

## 2022-01-27 ENCOUNTER — Encounter: Payer: Self-pay | Admitting: *Deleted

## 2022-01-27 ENCOUNTER — Inpatient Hospital Stay: Payer: Commercial Managed Care - PPO | Attending: Oncology | Admitting: Oncology

## 2022-01-27 ENCOUNTER — Other Ambulatory Visit: Payer: 59

## 2022-01-27 ENCOUNTER — Inpatient Hospital Stay: Payer: Commercial Managed Care - PPO

## 2022-01-27 ENCOUNTER — Ambulatory Visit: Payer: 59 | Admitting: Nurse Practitioner

## 2022-01-27 VITALS — BP 129/82 | HR 96 | Temp 98.2°F | Resp 18 | Ht 68.0 in | Wt 210.0 lb

## 2022-01-27 DIAGNOSIS — C787 Secondary malignant neoplasm of liver and intrahepatic bile duct: Secondary | ICD-10-CM

## 2022-01-27 DIAGNOSIS — K5900 Constipation, unspecified: Secondary | ICD-10-CM | POA: Diagnosis not present

## 2022-01-27 DIAGNOSIS — R202 Paresthesia of skin: Secondary | ICD-10-CM | POA: Diagnosis not present

## 2022-01-27 DIAGNOSIS — Z87891 Personal history of nicotine dependence: Secondary | ICD-10-CM | POA: Insufficient documentation

## 2022-01-27 DIAGNOSIS — G629 Polyneuropathy, unspecified: Secondary | ICD-10-CM | POA: Insufficient documentation

## 2022-01-27 DIAGNOSIS — G43909 Migraine, unspecified, not intractable, without status migrainosus: Secondary | ICD-10-CM | POA: Insufficient documentation

## 2022-01-27 DIAGNOSIS — G2581 Restless legs syndrome: Secondary | ICD-10-CM | POA: Diagnosis not present

## 2022-01-27 DIAGNOSIS — Z9049 Acquired absence of other specified parts of digestive tract: Secondary | ICD-10-CM | POA: Diagnosis not present

## 2022-01-27 DIAGNOSIS — C20 Malignant neoplasm of rectum: Secondary | ICD-10-CM | POA: Insufficient documentation

## 2022-01-27 DIAGNOSIS — E042 Nontoxic multinodular goiter: Secondary | ICD-10-CM | POA: Diagnosis not present

## 2022-01-27 DIAGNOSIS — R200 Anesthesia of skin: Secondary | ICD-10-CM | POA: Diagnosis not present

## 2022-01-27 DIAGNOSIS — Z95828 Presence of other vascular implants and grafts: Secondary | ICD-10-CM

## 2022-01-27 LAB — CEA (ACCESS): CEA (CHCC): 1.81 ng/mL (ref 0.00–5.00)

## 2022-01-27 MED ORDER — HEPARIN SOD (PORK) LOCK FLUSH 100 UNIT/ML IV SOLN
500.0000 [IU] | Freq: Once | INTRAVENOUS | Status: AC
  Administered 2022-01-27: 500 [IU] via INTRAVENOUS

## 2022-01-27 MED ORDER — SODIUM CHLORIDE 0.9% FLUSH
10.0000 mL | Freq: Once | INTRAVENOUS | Status: AC
  Administered 2022-01-27: 10 mL via INTRAVENOUS

## 2022-01-27 NOTE — Progress Notes (Signed)
?[facility] ?OFFICE PROGRESS NOTE ? ? ?Diagnosis: Rectal cancer ? ?INTERVAL HISTORY:  ? ?[name] returns as scheduled.  She continues to have constipation.  She reports she is not eating much.  The Port-A-Cath has not been removed. ?She underwent a repeat MRI abdomen and follow-up with Dr. Laurence Ferrari earlier this month.  No evidence of progressive rectal cancer was seen. ?She has persistent neuropathy symptoms in the feet. ?Objective: ? ?Vital signs in last 24 hours: ? ?Blood pressure 129/82, pulse 96, temperature 98.2 ?F (36.8 ?C), temperature source Oral, resp. rate 18, height 5' 8"  (1.727 m), weight 210 lb (95.3 kg), SpO2 100 %. ?  ? ?Lymphatics: No cervical, supraclavicular, axillary, or inguinal nodes ?Resp: Lungs clear bilaterally ?Cardio: Regular rate and rhythm ?GI: No hepatosplenomegaly  ?Vascular: No leg edema ? ?Portacath/PICC-without erythema ? ?Lab Results: ? ?Lab Results  ?Component Value Date  ? WBC 6.5 11/05/2021  ? HGB 13.7 11/05/2021  ? HCT 41.8 11/05/2021  ? MCV 93.1 11/05/2021  ? PLT 270.0 11/05/2021  ? NEUTROABS 2.6 08/26/2021  ? ? ?CMP  ?Lab Results  ?Component Value Date  ? NA 139 08/29/2021  ? K 3.9 08/29/2021  ? CL 108 08/29/2021  ? CO2 23 08/29/2021  ? GLUCOSE 93 08/29/2021  ? BUN 17 08/29/2021  ? CREATININE 1.01 (H) 08/29/2021  ? CALCIUM 9.1 08/29/2021  ? PROT 7.3 08/26/2021  ? ALBUMIN 3.1 (L) 08/26/2021  ? AST 36 08/26/2021  ? ALT 29 08/26/2021  ? ALKPHOS 78 08/26/2021  ? BILITOT 0.5 08/26/2021  ? GFRNONAA >60 08/29/2021  ? GFRAA >60 06/17/2020  ? ? ?Lab Results  ?Component Value Date  ? CEA1 6.85 (H) 06/17/2021  ? CEA 1.81 01/27/2022  ? ? ?Medications: I have reviewed the patient's current medications. ? ? ?Assessment/Plan: ?Rectal cancer ?Colonoscopy 05/17/2018 revealed a partially obstructing mass beginning at 10 cm from the anal verge, biopsy confirmed invasive moderately differentiated adenocarcinoma ?CTs 05/24/2018-mass beginning at proximal he 7.6 cm from the anal  verge, "20 "perirectal lymph nodes, no evidence of metastatic disease ?Pelvic MRI 06/01/2018- tumor measured at 10.8 cm from the anal sphincter, T3b, N1-2 left perirectal lymph nodes, each measuring 5 mm ?Radiation and Xeloda initiated 06/13/2018, completed 07/20/2018 ?Low anterior resection 09/16/2018,ypT3,ypN1c, 1 cm tumor, partial response-score 2, 0/13 lymph nodes positive, 2 tumor deposits, MSI-stable, no loss of mismatch repair protein expression ?Cycle 1 CAPOX 10/17/2018 ?Cycle 2 CAPOX 11/07/2018 (oxaliplatin dose reduced due to neutropenia) ?Cycle 3 CAPOX 11/28/2018 ?Cycle 4 CAPOX 12/19/2018 ?CTs 05/29/2019-no evidence of metastatic disease, severe right hydronephrosis, left thyroid nodule ?CTs 04/11/2020-subtle nodular lesion in the inferior right liver suspicious for a new metastasis, status post right ureteral resection and psoas hitch/flap, resolution of hydronephrosis, sacral insufficiency fractures bilaterally ?MRI liver 05/08/2020-2.1 cm segment 6 lesion suspicious for a metastasis, 4 mm suspicious segment 3 lesion 4 mm segment 4B lesion consistent with a hemangioma, no abdominal lymphadenopathy, cholelithiasis ?Ablation of segment 6 and segment 3 lesions 06/19/2020, pathology from segment 6 biopsy confirmed metastatic adenocarcinoma-microsatellite stable, tumor mutation burden 3, K-ras wild type, NRAS wild-type, BRAF [id number] ?MRI abdomen 08/13/2020-ablation defects in segment 6 and segment 3, tiny foci of restricted diffusion with arterial phase enhancement at the junctions of segment 4A and segment 4B, and segment 5 concerning for metastatic disease ?CTs 09/09/2020-stable ablation sites at segment 6 and segment 2. Small focus of hyperenhancement measuring 7 mm in the inferior right liver segment 6 anterior to the ablation site, and in the anterior right  liver segment 5/8-unchanged compared to MRI and concerning for metastatic disease, no other evidence of metastatic disease. Scattered irregular groundglass opacities  in the posterior right upper lobe ?Cycle 1 FOLFIRI/Avastin 09/23/2020 ?Treatment held 10/08/2020 due to neutropenia ?Cycle 2 FOLFIRI/Avastin 10/14/2020, Udenyca ?Cycle 3 FOLFIRI/Avastin 10/30/2020, Udenyca ?Cycle 4 FOLFIRI/Avastin 11/14/2020, Udenyca ?Cycle 5 FOLFIRI/Avastin 11/28/2020, Udenyca ?CTs 12/11/2020-no evidence of metastatic disease in the chest, stable liver ablation sites, stable hypervascular lesion in segment 4A, previously described enhancing lesion in the right hepatic lobe not well appreciated, no new lesions\ ?Real-time ultrasound evaluation by Dr. Laurence Ferrari 12/18/2020-no lesions are visible sonographically, therefore not a candidate for ablation ?Upper quadrant ultrasound 02/25/2021-no new or progressive hepatic metastases, prior ablation defects noted ?CTs 06/17/2021-unchanged 1.5 cm mixed attenuation left hepatic lesion, stable heterogenous lesion in the posterior right liver, no evidence of new metastatic disease-for review revealed 1.7 cm nodular lesion at the superior margin of the ablation site in the right liver concerning for recurrent disease ?CEA mildly elevated 06/17/2021 ?Ultrasound-guided biopsy of lesion followed by ablation encompassing the entirety of the tumor in the right liver, segment 6,, repeat CT revealed adequate ablation of the region of tumor, 07/30/2021, biopsy adenocarcinoma consistent with metastatic rectal adenocarcinoma ?CEA normal 08/08/2021 ?CT renal stone study 08/21/2021-microwave ablation defect in the right liver ?CT renal stone study 08/24/2021-decrease size of right liver hypodensity at site of prior microwave ablation ?CTs 10/13/2021-stable appearance of ablation defect in the liver, no evidence of metastatic disease ?MRI abdomen 01/12/2022-previously ablated liver lesions without enhancement, 3 stable subcentimeter foci of enhancement-likely hemangiomas ?History of Tobacco use ?Right UPJ stone on CT 05/24/2018 ?CT 11/21/2018-migration of previously noted right UPJ stone,  new marked right hydroureteronephrosis ?Placement of a right JJ stent 11/21/2018 ?Laser lithotripsy and replacement of right JJ stent 12/12/2018 ?Followed by Dr. Alyson Ingles, urology ?Migraine headaches ?Restless legs ?Arm pain during the oxaliplatin infusion and for 1 week after cycle 1.  The oxaliplatin will be further diluted and infusion time extended getting with cycle 2. ?Neutropenia secondary to chemotherapy.  Oxaliplatin dose reduced beginning with cycle 2. 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 Alyson Ingles, surgical reimplantation of the right ureter on 08/03/2019, right ureter stent removed 09/14/2019 ?Thyroid nodules-new versus enlarged left thyroid nodule CT 05/29/2019, thyroid ultrasound 06/20/2019-multiple nodules, 1 right-sided nodule met criteria for ultrasound follow-up ?Ultrasound 05/08/2020-multinodular goiter, stable nodules in the isthmus and right thyroid meeting criteria for 1 year follow-up, additional stable nodules do not meet criteria for follow-up ?12.  Right ureter injury following Foley catheter placement 07/30/2021-pyelogram confirmed extravasation of urine, placement of right ureter stent and Foley catheter 07/31/2021 ?Right ureter stent removed 09/04/2021 ?13.  Admission with urosepsis 08/24/2021 ?  ? ? ? ?Disposition: ?[name] appears stable.  She is in clinical remission from colon cancer.  The restaging abdominal MRI reveals no evidence of progressive disease.  The CEA is normal. ?[name] will be referred for Port-A-Cath removal.  She will return for an office visit and CEA in 3 months.  She will be scheduled for a restaging MRI abdomen in July. ? ?Betsy Coder, MD ? ?01/27/2022  ?2:49 PM ? ? ?

## 2022-01-27 NOTE — Patient Instructions (Signed)

## 2022-01-28 ENCOUNTER — Other Ambulatory Visit: Payer: Self-pay | Admitting: Student

## 2022-01-29 ENCOUNTER — Encounter: Payer: Self-pay | Admitting: Oncology

## 2022-01-29 ENCOUNTER — Ambulatory Visit (HOSPITAL_COMMUNITY)
Admission: RE | Admit: 2022-01-29 | Discharge: 2022-01-29 | Disposition: A | Discharge status: Home / Self Care | Payer: 59 | Source: Ambulatory Visit | Attending: Oncology | Admitting: Oncology

## 2022-01-29 ENCOUNTER — Encounter (HOSPITAL_COMMUNITY): Payer: Self-pay

## 2022-01-29 DIAGNOSIS — C787 Secondary malignant neoplasm of liver and intrahepatic bile duct: Secondary | ICD-10-CM | POA: Insufficient documentation

## 2022-01-29 DIAGNOSIS — C20 Malignant neoplasm of rectum: Secondary | ICD-10-CM | POA: Diagnosis not present

## 2022-01-29 DIAGNOSIS — Z452 Encounter for adjustment and management of vascular access device: Secondary | ICD-10-CM | POA: Diagnosis present

## 2022-01-29 HISTORY — PX: IR REMOVAL TUN ACCESS W/ PORT W/O FL MOD SED: IMG2290

## 2022-01-29 MED ORDER — MIDAZOLAM HCL 2 MG/2ML IJ SOLN
INTRAMUSCULAR | Status: AC
  Filled 2022-01-29: qty 2

## 2022-01-29 MED ORDER — FENTANYL CITRATE (PF) 100 MCG/2ML IJ SOLN
INTRAMUSCULAR | Status: AC | PRN
Start: 2022-01-29 — End: 2022-01-29
  Administered 2022-01-29: 50 ug via INTRAVENOUS

## 2022-01-29 MED ORDER — MIDAZOLAM HCL 2 MG/2ML IJ SOLN
INTRAMUSCULAR | Status: AC | PRN
  Administered 2022-01-29: 1 mg via INTRAVENOUS

## 2022-01-29 MED ORDER — SODIUM CHLORIDE 0.9 % IV SOLN
INTRAVENOUS | Status: DC
Start: 2022-01-29 — End: 2022-01-30

## 2022-01-29 MED ORDER — LIDOCAINE HCL 1 % IJ SOLN
INTRAMUSCULAR | Status: AC
  Administered 2022-01-29: 10 mL
  Filled 2022-01-29: qty 20

## 2022-01-29 MED ORDER — FENTANYL CITRATE (PF) 100 MCG/2ML IJ SOLN
INTRAMUSCULAR | Status: AC
  Filled 2022-01-29: qty 2

## 2022-01-29 NOTE — Procedures (Addendum)
Interventional Radiology Procedure Note ? ?Procedure: Removal of a right IJ approach single lumen PowerPort.   ?Complications: None ? ?Recommendations:  ?- Ok to shower tomorrow ?- Do not submerge for 7 days ?- advance diet ?- restart all meds on schedule ?  ? ?Signed, ? ?Dulcy Fanny. Earleen Newport, DO ? ? ?

## 2022-01-29 NOTE — H&P (Signed)
? ? ?Referring Physician(s): ?Sherrill,Gary B ? ?Supervising Physician: Corrie Mckusick ? ?Patient Status:  [facility] ? ?Chief Complaint: ? ?"I'm getting my port out" ? ?Subjective: ?Patient familiar to IR service from right nephrostomy in 2020, segment 6 liver lesion biopsy and segments 3 and 6 liver lesion microwave ablations in 2021, Port-A-Cath placement in 2021, and thermal ablation and biopsy of segment 6 liver lesion in 2022.  She has a history of metastatic rectal cancer and is currently in clinical remission.  She presents today for Port-A-Cath removal. She currently denies fever, headache, chest pain, dyspnea, cough, abdominal/back pain, nausea, vomiting or bleeding.  ? ?Past Medical History:  ?Diagnosis Date  ? Adenocarcinoma of rectum [facility]) oncologist-- dr Benay Spice--  per last note in epic ,  clinical remission  ? dx 08/ 2019---- chemoradiation concurrent completed 07-20-2018;  s/p  low anterior resection 09-16-2018;   chemo 10-17-2018  ot 12-19-2018  ? Anxiety   ? Arthritis   ? Chemotherapy induced neutropenia (HCC)   ? Depression   ? Headache   ? migraines   ? History of HPV infection   ? History of kidney stones   ? Hydronephrosis, right   ? Restless leg syndrome   ? Scoliosis   ? Sepsis ([place])   ? 11-2018  ? ?Past Surgical History:  ?Procedure Laterality Date  ? COLONOSCOPY    ? COLPOSCOPY  04/04/2018  ? CYSTOSCOPY W/ RETROGRADES Right 07/31/2021  ? Procedure: CYSTOSCOPY WITH RETROGRADE PYELOGRAM, URETEROSCOPY AND STENT PLACEMENT;  Surgeon: Janith Lima, MD;  Location: [facility];  Service: Urology;  Laterality: Right;  ? CYSTOSCOPY W/ URETERAL STENT PLACEMENT Right 09/04/2021  ? Procedure: CYSTOSCOPY WITH RETROGRADE PYELOGRAM WITH STENT REMOVAL;  Surgeon: Cleon Gustin, MD;  Location: [facility];  Service: Urology;  Laterality: Right;  ? CYSTOSCOPY WITH RETROGRADE PYELOGRAM, URETEROSCOPY AND STENT PLACEMENT Right 12/12/2018  ? Procedure: CYSTOSCOPY WITH RETROGRADE PYELOGRAM, URETEROSCOPY, STONE EXTRACTION  AND STENT PLACEMENT;  Surgeon: Cleon Gustin, MD;  [facility];  Service: Urology;  Laterality: Right;  ? CYSTOSCOPY WITH RETROGRADE PYELOGRAM, URETEROSCOPY AND STENT PLACEMENT Right 04/17/2019  ? Procedure: CYSTOSCOPY WITH RETROGRADE PYELOGRAM, BALLOON [place]  URETEROSCOPY AND STENT PLACEMENT;  Surgeon: Cleon Gustin, MD;  Location: [facility];  Service: Urology;  Laterality: Right;  ? CYSTOSCOPY WITH STENT PLACEMENT Right 11/21/2018  ? Procedure: CYSTOSCOPY, RIGHT RETROGRADE PYELOGRAM, WITH RIGHT URETERAL STENT PLACEMENT;  Surgeon: Cleon Gustin, MD;  Location: [facility];  Service: Urology;  Laterality: Right;  ? IR IMAGING GUIDED PORT INSERTION  09/04/2020  ? IR NEPHROSTOMY PLACEMENT RIGHT  06/22/2019  ? IR PATIENT EVAL TECH 0-60 MINS  06/28/2019  ? IR RADIOLOGIST EVAL & MGMT  06/06/2020  ? IR RADIOLOGIST EVAL & MGMT  07/17/2020  ? IR RADIOLOGIST EVAL & MGMT  08/15/2020  ? IR RADIOLOGIST EVAL & MGMT  09/03/2020  ? IR RADIOLOGIST EVAL & MGMT  12/18/2020  ? IR RADIOLOGIST EVAL & MGMT  02/25/2021  ? IR RADIOLOGIST EVAL & MGMT  06/23/2021  ? IR RADIOLOGIST EVAL & MGMT  08/12/2021  ? IR RADIOLOGIST EVAL & MGMT  10/15/2021  ? IR RADIOLOGIST EVAL & MGMT  01/13/2022  ? left forearm surgery due to calcium rock     ? RADIOLOGY WITH ANESTHESIA N/A 06/19/2020  ? Procedure: CT WITH ANESTHESIA MICROWAVE ABLATION;  Surgeon: Jacqulynn Cadet, MD;  Location: [facility];  Service: Anesthesiology;  Laterality: N/A;  ? RADIOLOGY WITH ANESTHESIA N/A 07/30/2021  ?  Procedure: CT MICROWAVE ABLATION OF THE LIVER;  Surgeon: Criselda Peaches, MD;  Location: [facility];  Service: Radiology;  Laterality: N/A;  ? ROBOT ASSISTED PYELOPLASTY Right 08/03/2019  ? Procedure: XI ROBOTIC ASSISTED PYELOPLASTY WITH STENT PLACEMENT;  Surgeon: Cleon Gustin, MD;  Location: [facility];  Service: Urology;  Laterality: Right;  3 HRS  ? SHOULDER ARTHROSCOPY W/ ROTATOR CUFF REPAIR Right   ? XI  ROBOTIC ASSISTED LOWER ANTERIOR RESECTION N/A 09/16/2018  ? Procedure: XI ROBOTIC ASSISTED LOWER ANTERIOR RESECTION ERAS PATHWAY;  Surgeon: Leighton Ruff, MD;  Location: [facility];  Service: General;  Laterality: N/A;  ? ? ? ? ? ?Allergies: ?Prednisone and Codeine ? ?Medications: ?Prior to Admission medications   ?Medication Sig Start Date End Date Taking? Authorizing Provider  ?alprazolam (XANAX) 2 MG tablet Take 2 mg by mouth every morning.     [provider]  ?buPROPion (WELLBUTRIN XL) 300 MG 24 hr tablet Take 300 mg by mouth every morning.     [provider]  ?cyclobenzaprine (FLEXERIL) 10 MG tablet Take 20 mg by mouth at bedtime.    [provider]  ?HYDROcodone-acetaminophen (NORCO) 10-325 MG tablet Take 1-2 tablets by mouth every 6 (six) hours as needed for moderate pain or severe pain (headache). Post-Operatively. 09/04/21   McKenzie, Candee Furbish, MD  ?lubiprostone (AMITIZA) 24 MCG capsule Take 1 capsule (24 mcg total) by mouth 2 (two) times daily with a meal. ?Patient not taking: Reported on 01/27/2022 12/01/21 05/25/23  Lavena Bullion, DO  ?Prucalopride Succinate (MOTEGRITY) 2 MG TABS Take 1 tablet (2 mg total) by mouth daily. ?Patient not taking: Reported on 01/27/2022 10/07/21   Cirigliano, Luanna Salk V, DO  ?rOPINIRole (REQUIP) 1 MG tablet Take 1 mg by mouth at bedtime.    [provider]  ? ? ? ?Vital Signs:pending ?LMP  (LMP Unknown)  ? ?Physical Exam awake, alert.  Chest clear to auscultation bilaterally; clean, intact right chest wall Port-A-Cath ;heart with regular rate and rhythm.  Abdomen soft, positive bowel sounds, nontender.  No lower extremity edema. ? ?Imaging: ?No results found. ? ?Labs: ? ?CBC: ?Recent Labs  ?  08/26/21 ?0905 08/28/21 ?0722 08/29/21 ?0458 11/05/21 ?1438  ?WBC 4.3 4.4 4.8 6.5  ?HGB 10.6* 10.8* 10.7* 13.7  ?HCT 33.1* 32.9* 33.1* 41.8  ?PLT 184 203 233 270.0  ? ? ?COAGS: ?Recent Labs  ?  07/15/21 ?1342  ?INR 1.1  ? ? ?BMP: ?Recent Labs  ?   08/25/21 ?0426 08/26/21 ?0905 08/28/21 ?0722 08/29/21 ?0458  ?NA 134* 139 136 139  ?K 3.0* 4.1 4.2 3.9  ?CL 104 109 105 108  ?CO2 23 20* 21* 23  ?GLUCOSE 110* 111* 85 93  ?BUN 28* '17 18 17  '$ ?CALCIUM 7.9* 8.5* 8.6* 9.1  ?CREATININE 1.21* 0.99 1.00 1.01*  ?GFRNONAA 52* >60 >60 >60  ? ? ?LIVER FUNCTION TESTS: ?Recent Labs  ?  08/21/21 ?2118 08/24/21 ?1800 08/25/21 ?0426 08/26/21 ?0905  ?BILITOT 0.7 1.1 0.6 0.5  ?AST '19 29 24 '$ 36  ?ALT '22 24 22 29  '$ ?ALKPHOS 99 95 78 78  ?PROT 7.5 8.2* 6.7 7.3  ?ALBUMIN 3.9 3.6 3.0* 3.1*  ? ? ?Assessment and Plan: ?Patient familiar to IR service from right nephrostomy in 2020, segment 6 liver lesion biopsy and segments 3 and 6 liver lesion microwave ablations in 2021, Port-A-Cath placement in 2021, and thermal ablation and biopsy of segment 6 liver lesion in 2022.  She has a history of metastatic rectal cancer and  is currently in clinical remission.  She presents today for Port-A-Cath removal.  Details/risks of procedure, including but not limited to, internal bleeding, infection, injury to adjacent structures discussed with patient with her understanding and consent. ? ? ?Electronically Signed: ?Autumn Messing, PA-C ?01/29/2022, 8:01 AM ? ? ?I spent a total of 20 minutes at the the patient's bedside AND on the patient's hospital floor or unit, greater than 50% of which was counseling/coordinating care for Port-A-Cath removal ? ? ? ? ? ?

## 2022-01-29 NOTE — Discharge Instructions (Signed)
Please call Interventional Radiology clinic [phone] with any questions or concerns. ? ?You may remove your dressing and shower tomorrow. ? ? ?

## 2022-02-04 ENCOUNTER — Encounter: Payer: Self-pay | Admitting: Oncology

## 2022-03-13 ENCOUNTER — Other Ambulatory Visit: Payer: Self-pay | Admitting: Oncology

## 2022-03-13 DIAGNOSIS — C189 Malignant neoplasm of colon, unspecified: Secondary | ICD-10-CM

## 2022-03-17 ENCOUNTER — Other Ambulatory Visit: Payer: Self-pay | Admitting: Interventional Radiology

## 2022-03-17 DIAGNOSIS — C189 Malignant neoplasm of colon, unspecified: Secondary | ICD-10-CM

## 2022-04-10 ENCOUNTER — Ambulatory Visit
Admission: RE | Admit: 2022-04-10 | Discharge: 2022-04-10 | Disposition: A | Discharge status: Home / Self Care | Payer: 59 | Source: Ambulatory Visit | Attending: Oncology | Admitting: Oncology

## 2022-04-10 ENCOUNTER — Encounter: Payer: Self-pay | Admitting: Oncology

## 2022-04-10 DIAGNOSIS — C189 Malignant neoplasm of colon, unspecified: Secondary | ICD-10-CM

## 2022-04-10 MED ORDER — GADOBENATE DIMEGLUMINE 529 MG/ML IV SOLN
19.0000 mL | Freq: Once | INTRAVENOUS | Status: AC | PRN
  Administered 2022-04-10: 19 mL via INTRAVENOUS

## 2022-04-14 ENCOUNTER — Encounter: Payer: Self-pay | Admitting: *Deleted

## 2022-04-14 ENCOUNTER — Ambulatory Visit
Admission: RE | Admit: 2022-04-14 | Discharge: 2022-04-14 | Disposition: A | Discharge status: Home / Self Care | Payer: 59 | Source: Ambulatory Visit | Attending: Interventional Radiology | Admitting: Interventional Radiology

## 2022-04-14 DIAGNOSIS — C189 Malignant neoplasm of colon, unspecified: Secondary | ICD-10-CM

## 2022-04-14 HISTORY — PX: IR RADIOLOGIST EVAL & MGMT: IMG5224

## 2022-04-14 NOTE — Progress Notes (Signed)
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    Referring Physician(s): Julieanne Manson, MD  History of Present Illness: Norma Colon is a 61 y.o. female who initially presented at the kind request of Dr. 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  Norma Colon was initially diagnosed with a partially obstructing rectal mass in August 2019.  She underwent neoadjuvant combined radiation and chemotherapy (Xeloda) from September through October 2019 followed by a low anterior resection in December 2019.     Subsequently, she underwent 4 cycles of additional adjuvant chemotherapy with CAPOX completed in March 2020.  Subsequent surveillance imaging initially detected no evidence of metastatic disease.  Unfortunately, CT imaging from July 2021 demonstrated a subtle lesion in hepatic segment 6 concerning for metastatic disease.  A follow-up MRI dated 05/08/2020 confirms a 2.1 cm solid lesion in hepatic segment 6 as well as a concerning for millimeter lesion in the posterior aspect of segment 3.  Both lesions are concerning for metastatic disease.   She underwent concurrent ultrasound-guided biopsy and microwave ablation of the 2 liver lesions on 06/19/2020.     Follow-up MRI imaging from 08/13/20 showed an excellent treatment effect at the sites of the 2 ablated lesions, but 2 new lesions highly concerning for additional metastatic foci.  Norma Colon therefore went back for additional chemotherapy and has now completed another cycle of FOLFIRI/Avastin on 11/28/2020.  She reported increased malaise following this cycle of chemotherapy.  She had diarrhea and mild nosebleeding.   Restaging CT scans of the chest, abdomen and pelvis dated 12/12/2020 demonstrates no evidence of new metastatic disease or disease outside the liver.  The previously  identified tiny hypervascular lesions in segment 4A and segment 6 are less conspicuous and perhaps slightly smaller consistent with interval treatment response.     US imaging performed by me on  12/18/20 and again on 02/25/21 showed no visible hepatic lesions.    Re-staging CT scans 06/17/21 - New 1.7 cm lesion adjacent to the posterosuperior margin of the segment 6 ablation defect concerning for recurrent disease.    She underwent CT-guided biopsy and repeat microwave ablation of the recurrent tumor on 07/30/2021.  Unfortunately, at that time she sustained an inadvertent injury to the right ureter presumably due to unfortunate Foley catheter placement with in the reimplanted ureter.  Fortunately, we were able to diagnose this within the first day and urology was able to traverse the injury and place a double-J ureteral stent.  She also developed acute kidney injury with elevated creatinine due to to peritoneal reabsorption of the leaking urine.  CT CAP 10/13/2021: Post ablation defects in the liver without evidence of progressive disease or new metastatic disease in the chest, abdomen or pelvis.   MRI Abd 01/12/22: No new suspicious mass or lymphadenopathy identified in the abdomen. Previously ablated liver lesions without suspicious enhancement.  MRI Abd 04/10/22: Stable appearance of previously ablated lesions in segment 3 and 6 of the liver, as above. No signs of local recurrence of disease, and no new metastatic lesions noted in the liver on today's examination.   Overall, Norma Colon is doing well.   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[facility]) oncologist-- dr Benay Spice--  per last note in epic ,  clinical remission   dx 08/ 2019---- chemoradiation concurrent completed 07-20-2018;  s/p  low anterior  resection 09-16-2018;   chemo 10-17-2018  ot 12-19-2018   Anxiety    Arthritis    Chemotherapy induced neutropenia (HCC)    Depression    Headache    migraines    History  of HPV infection    History of kidney stones    Hydronephrosis, right    Restless leg syndrome    Scoliosis    Sepsis ([place])    11-2018    Past Surgical History:  Procedure Laterality Date   COLONOSCOPY     COLPOSCOPY  04/04/2018   CYSTOSCOPY W/ RETROGRADES Right 07/31/2021   Procedure: CYSTOSCOPY WITH RETROGRADE PYELOGRAM, URETEROSCOPY AND STENT PLACEMENT;  Surgeon: Janith Lima, MD;  Location: [place];  Service: Urology;  Laterality: Right;   CYSTOSCOPY W/ URETERAL STENT PLACEMENT Right 09/04/2021   Procedure: CYSTOSCOPY WITH RETROGRADE PYELOGRAM WITH STENT REMOVAL;  Surgeon: Cleon Gustin, MD;  Location: [facility];  Service: Urology;  Laterality: Right;   CYSTOSCOPY WITH RETROGRADE PYELOGRAM, URETEROSCOPY AND STENT PLACEMENT Right 12/12/2018   Procedure: CYSTOSCOPY WITH RETROGRADE PYELOGRAM, URETEROSCOPY, STONE EXTRACTION AND STENT PLACEMENT;  Surgeon: Cleon Gustin, MD;  Location: [facility];  Service: Urology;  Laterality: Right;   CYSTOSCOPY WITH RETROGRADE PYELOGRAM, URETEROSCOPY AND STENT PLACEMENT Right 04/17/2019   Procedure: CYSTOSCOPY WITH RETROGRADE PYELOGRAM, BALLOON [place]  URETEROSCOPY AND STENT PLACEMENT;  Surgeon: Cleon Gustin, MD;  Location: [facility];  Service: Urology;  Laterality: Right;   CYSTOSCOPY WITH STENT PLACEMENT Right 11/21/2018   Procedure: CYSTOSCOPY, RIGHT RETROGRADE PYELOGRAM, WITH RIGHT URETERAL STENT PLACEMENT;  Surgeon: Cleon Gustin, MD;  Location: [facility];  Service: Urology;  Laterality: Right;   IR IMAGING GUIDED PORT INSERTION  09/04/2020   IR NEPHROSTOMY PLACEMENT RIGHT  06/22/2019   IR PATIENT EVAL TECH 0-60 MINS  06/28/2019   IR RADIOLOGIST EVAL & MGMT  06/06/2020   IR RADIOLOGIST EVAL & MGMT  07/17/2020   IR RADIOLOGIST EVAL & MGMT  08/15/2020   IR RADIOLOGIST EVAL & MGMT  09/03/2020   IR RADIOLOGIST EVAL & MGMT  12/18/2020   IR RADIOLOGIST EVAL & MGMT  02/25/2021   IR  RADIOLOGIST EVAL & MGMT  06/23/2021   IR RADIOLOGIST EVAL & MGMT  08/12/2021   IR RADIOLOGIST EVAL & MGMT  10/15/2021   IR RADIOLOGIST EVAL & MGMT  01/13/2022   IR REMOVAL TUN ACCESS W/ PORT W/O FL MOD SED  01/29/2022   left forearm surgery due to calcium rock      RADIOLOGY WITH ANESTHESIA N/A 06/19/2020   Procedure: CT WITH ANESTHESIA MICROWAVE ABLATION;  Surgeon: Jacqulynn Cadet, MD;  Location: [facility];  Service: Anesthesiology;  Laterality: N/A;   RADIOLOGY WITH ANESTHESIA N/A 07/30/2021   Procedure: CT MICROWAVE ABLATION OF THE LIVER;  Surgeon: Criselda Peaches, MD;  Location: [facility];  Service: Radiology;  Laterality: N/A;   ROBOT ASSISTED PYELOPLASTY Right 08/03/2019   Procedure: XI ROBOTIC ASSISTED PYELOPLASTY WITH STENT PLACEMENT;  Surgeon: Cleon Gustin, MD;  Location: [facility];  Service: Urology;  Laterality: Right;  3 HRS   SHOULDER ARTHROSCOPY W/ ROTATOR CUFF REPAIR Right    XI ROBOTIC ASSISTED LOWER ANTERIOR RESECTION N/A 09/16/2018   Procedure: XI ROBOTIC ASSISTED LOWER ANTERIOR RESECTION ERAS PATHWAY;  Surgeon: Leighton Ruff, MD;  Location: [facility];  Service: General;  Laterality: N/A;    Allergies: Prednisone and Codeine  Medications: Prior to Admission medications   Medication Sig Start Date End Date Taking? Authorizing Provider  alprazolam (  XANAX) 2 MG tablet Take 2 mg by mouth every morning.     [provider]  buPROPion (WELLBUTRIN XL) 300 MG 24 hr tablet Take 300 mg by mouth every morning.     [provider]  cyclobenzaprine (FLEXERIL) 10 MG tablet Take 20 mg by mouth at bedtime.    [provider]  HYDROcodone-acetaminophen (NORCO) 10-325 MG tablet Take 1-2 tablets by mouth every 6 (six) hours as needed for moderate pain or severe pain (headache). Post-Operatively. 09/04/21   McKenzie, Candee Furbish, MD  lubiprostone (AMITIZA) 24 MCG capsule Take 1 capsule (24 mcg total) by mouth 2 (two) times daily with a meal. 12/01/21 05/25/23  Cirigliano,  Vito V, DO  Prucalopride Succinate (MOTEGRITY) 2 MG TABS Take 1 tablet (2 mg total) by mouth daily. 10/07/21   Cirigliano, Vito V, DO  rOPINIRole (REQUIP) 1 MG tablet Take 1 mg by mouth at bedtime.    [provider]     Family History  Problem Relation Age of Onset   COPD Father        smoker   Stomach cancer Father        spread to lungs and bones   Arthritis Mother    Heart attack Paternal Grandmother    Heart attack Paternal Grandfather    Heart attack Maternal Grandmother    Stroke Maternal Grandmother    Colon cancer Neg Hx    Rectal cancer Neg Hx    Esophageal cancer Neg Hx     Social History   Socioeconomic History   Marital status: Divorced    Spouse name: Not on file   Number of children: 1   Years of education: Not on file   Highest education level: Not on file  Occupational History   Occupation: Land  Tobacco Use   Smoking status: Former    Packs/day: 1.00    Years: 30.00    Total pack years: 30.00    Types: Cigarettes    Quit date: 09/16/2018    Years since quitting: 3.5   Smokeless tobacco: Never  Vaping Use   Vaping Use: Never used  Substance and Sexual Activity   Alcohol use: Yes    Comment: social   Drug use: Never   Sexual activity: Yes  Other Topics Concern   Not on file  Social History Narrative   Not on file   Social Determinants of Health   Financial Resource Strain: Not on file  Food Insecurity: Not on file  Transportation Needs: Not on file  Physical Activity: Not on file  Stress: Not on file  Social Connections: Not on file    ECOG Status: 0 - Asymptomatic  Review of Systems  Review of Systems: A 12 point ROS discussed and pertinent positives are indicated in the HPI above.  All other systems are negative.    Physical Exam No direct physical exam was performed (except for noted visual exam findings with Video Visits).    Vital Signs: LMP  (LMP Unknown)   Imaging: MR ABDOMEN WWO CONTRAST  Result Date:  04/13/2022 CLINICAL DATA:  61 year old female with history of colon cancer with metastatic disease to the liver. Follow-up study. EXAM: MRI ABDOMEN WITHOUT AND WITH CONTRAST TECHNIQUE: Multiplanar multisequence MR imaging of the abdomen was performed both before and after the administration of intravenous contrast. CONTRAST:  43m MULTIHANCE GADOBENATE DIMEGLUMINE 529 MG/ML IV SOLN COMPARISON:  Abdominal MRI 01/12/2022. FINDINGS: Comment: Portions of today's examination are limited by considerable patient motion.  Lower chest: Unremarkable. Hepatobiliary: Diffuse loss of signal intensity throughout the hepatic parenchyma on out of phase dual echo images, indicative of a background of hepatic steatosis. Ablated lesions in the posterior aspect of segment 3 of the liver and in the posterior aspect of the right lobe of the liver centered predominantly in segment 6 appear very similar to the prior examination, with variable T1 signal intensity, generally T2 isointense, and hypovascular on post gadolinium imaging. No diffusion restriction associated with either of the treated lesions. No new hepatic lesions are otherwise noted. No intra or extrahepatic biliary ductal dilatation. Gallbladder is nearly completely decompressed around several filling defects, compatible with small gallstones. No pericholecystic fluid. Pancreas: No pancreatic mass. No pancreatic ductal dilatation. No pancreatic or peripancreatic fluid collections or inflammatory changes. Spleen:  Unremarkable. Adrenals/Urinary Tract: Bilateral kidneys and adrenal glands are normal in appearance. No hydroureteronephrosis in the visualized portions of the abdomen. Stomach/Bowel: Visualized portions are unremarkable. Vascular/Lymphatic: No significant volume of no aneurysm identified in the visualized abdominal vasculature. No lymphadenopathy noted in the abdomen. Other: No significant volume of ascites noted in the visualized portions of the peritoneal cavity.  Musculoskeletal: No aggressive appearing osseous lesions are noted in the visualized portions of the skeleton. IMPRESSION: 1. Stable appearance of previously ablated lesions in segment 3 and 6 of the liver, as above. No signs of local recurrence of disease, and no new metastatic lesions noted in the liver on today's examination. 2. Cholelithiasis without evidence of acute cholecystitis at this time. 3. Hepatic steatosis. Electronically Signed   By: Vinnie Langton M.D.   On: 04/13/2022 06:40    Labs:  CBC: Recent Labs    08/26/21 0905 08/28/21 0722 08/29/21 0458 11/05/21 1438  WBC 4.3 4.4 4.8 6.5  HGB 10.6* 10.8* 10.7* 13.7  HCT 33.1* 32.9* 33.1* 41.8  PLT 184 203 233 270.0    COAGS: Recent Labs    07/15/21 1342  INR 1.1    BMP: Recent Labs    08/25/21 0426 08/26/21 0905 08/28/21 0722 08/29/21 0458  NA 134* 139 136 139  K 3.0* 4.1 4.2 3.9  CL 104 109 105 108  CO2 23 20* 21* 23  GLUCOSE 110* 111* 85 93  BUN 28* '17 18 17  '$ CALCIUM 7.9* 8.5* 8.6* 9.1  CREATININE 1.21* 0.99 1.00 1.01*  GFRNONAA 52* >60 >60 >60    LIVER FUNCTION TESTS: Recent Labs    08/21/21 2118 08/24/21 1800 08/25/21 0426 08/26/21 0905  BILITOT 0.7 1.1 0.6 0.5  AST '19 29 24 '$ 36  ALT '22 24 22 29  '$ ALKPHOS 99 95 78 78  PROT 7.5 8.2* 6.7 7.3  ALBUMIN 3.9 3.6 3.0* 3.1*    TUMOR MARKERS: Recent Labs    08/08/21 1130 09/18/21 1345 11/04/21 1420 01/27/22 1130  CEA 3.05 2.30 1.63 1.81    Assessment and Plan:  61 year old female with colon cancer metastatic to the liver status post percutaneous microwave ablation x2.  She is now doing well at 9 months post procedure with no evidence of residual or recurrent disease.  We will continue routine imaging surveillance.  1.) Repeat liver MRI with contrast in October 2023 for 1 year f/u with clinic visit to follow imaging.     Electronically Signed: Criselda Peaches 04/14/2022, 12:33 PM   I spent a total of  15 Minutes in remote  clinical  consultation, greater than 50% of which was counseling/coordinating care for colon cancer metastatic to the liver.    Visit type: Audio  only (telephone). Audio (no video) only due to patient preference. Alternative for in-person consultation at [facility], [address] [place], [place], [place]. This visit type was conducted due to national recommendations for restrictions regarding the COVID-19 Pandemic (e.g. social distancing).  This format is felt to be most appropriate for this patient at this time.  All issues noted in this document were discussed and addressed.

## 2022-04-27 ENCOUNTER — Other Ambulatory Visit: Payer: Self-pay

## 2022-04-30 ENCOUNTER — Inpatient Hospital Stay: Payer: Commercial Managed Care - PPO | Attending: Oncology

## 2022-04-30 ENCOUNTER — Inpatient Hospital Stay (HOSPITAL_BASED_OUTPATIENT_CLINIC_OR_DEPARTMENT_OTHER): Payer: Commercial Managed Care - PPO | Admitting: Oncology

## 2022-04-30 VITALS — BP 118/82 | HR 97 | Temp 98.2°F | Resp 18 | Ht 68.0 in | Wt 206.3 lb

## 2022-04-30 DIAGNOSIS — C20 Malignant neoplasm of rectum: Secondary | ICD-10-CM

## 2022-04-30 DIAGNOSIS — C787 Secondary malignant neoplasm of liver and intrahepatic bile duct: Secondary | ICD-10-CM

## 2022-04-30 DIAGNOSIS — G43909 Migraine, unspecified, not intractable, without status migrainosus: Secondary | ICD-10-CM | POA: Diagnosis not present

## 2022-04-30 DIAGNOSIS — E042 Nontoxic multinodular goiter: Secondary | ICD-10-CM | POA: Insufficient documentation

## 2022-04-30 DIAGNOSIS — G2581 Restless legs syndrome: Secondary | ICD-10-CM | POA: Diagnosis not present

## 2022-04-30 DIAGNOSIS — Z87891 Personal history of nicotine dependence: Secondary | ICD-10-CM | POA: Insufficient documentation

## 2022-04-30 LAB — CEA (ACCESS): CEA (CHCC): 1.53 ng/mL (ref 0.00–5.00)

## 2022-04-30 NOTE — Progress Notes (Signed)
Norma Colon OFFICE PROGRESS NOTE   Diagnosis: Rectal cancer  INTERVAL HISTORY:   Norma Colon returns as scheduled.  She generally feels well.  She has intermittent right flank pain.  She has chronic constipation.  No new complaint.  She underwent a restaging abdomen MRI in July.  There was no evidence of residual/recurrent disease.  She saw Dr. Laurence Ferrari and will be scheduled for a repeat MRI in October.  Objective:  Vital signs in last 24 hours:  Blood pressure 118/82, pulse 97, temperature 98.2 F (36.8 C), temperature source Oral, resp. rate 18, height 5' 8"  (1.727 m), weight 206 lb 4.8 oz (93.6 kg), SpO2 98 %.    HEENT: Neck without mass Lymphatics: No cervical, supraclavicular, axillary, or inguinal nodes Resp: Lungs clear bilaterally Cardio: Regular rate and rhythm GI: No hepatosplenomegaly, no mass, nontender Vascular: No leg edema  Skin: Mild tenderness at the right upper chest Port-A-Cath scar, no erythema or fluctuance    Lab Results:  Lab Results  Component Value Date   WBC 6.5 11/05/2021   HGB 13.7 11/05/2021   HCT 41.8 11/05/2021   MCV 93.1 11/05/2021   PLT 270.0 11/05/2021   NEUTROABS 2.6 08/26/2021    CMP  Lab Results  Component Value Date   NA 139 08/29/2021   K 3.9 08/29/2021   CL 108 08/29/2021   CO2 23 08/29/2021   GLUCOSE 93 08/29/2021   BUN 17 08/29/2021   CREATININE 1.01 (H) 08/29/2021   CALCIUM 9.1 08/29/2021   PROT 7.3 08/26/2021   ALBUMIN 3.1 (L) 08/26/2021   AST 36 08/26/2021   ALT 29 08/26/2021   ALKPHOS 78 08/26/2021   BILITOT 0.5 08/26/2021   GFRNONAA >60 08/29/2021   GFRAA >60 06/17/2020    Lab Results  Component Value Date   CEA1 6.85 (H) 06/17/2021   CEA 1.81 01/27/2022      Medications: I have reviewed the patient's current medications.   Assessment/Plan:  Rectal cancer Colonoscopy 05/17/2018 revealed a partially obstructing mass beginning at 10 cm from the anal verge, biopsy confirmed invasive  moderately differentiated adenocarcinoma CTs 05/24/2018-mass beginning at proximal he 7.6 cm from the anal verge, "20 "perirectal lymph nodes, no evidence of metastatic disease Pelvic MRI 06/01/2018- tumor measured at 10.8 cm from the anal sphincter, T3b, N1-2 left perirectal lymph nodes, each measuring 5 mm Radiation and Xeloda initiated 06/13/2018, completed 07/20/2018 Low anterior resection 09/16/2018,ypT3,ypN1c, 1 cm tumor, partial response-score 2, 0/13 lymph nodes positive, 2 tumor deposits, MSI-stable, no loss of mismatch repair protein expression Cycle 1 CAPOX 10/17/2018 Cycle 2 CAPOX 11/07/2018 (oxaliplatin dose reduced due to neutropenia) Cycle 3 CAPOX 11/28/2018 Cycle 4 CAPOX 12/19/2018 CTs 05/29/2019-no evidence of metastatic disease, severe right hydronephrosis, left thyroid nodule CTs 04/11/2020-subtle nodular lesion in the inferior right liver suspicious for a new metastasis, status post right ureteral resection and psoas hitch/flap, resolution of hydronephrosis, sacral insufficiency fractures bilaterally MRI liver 05/08/2020-2.1 cm segment 6 lesion suspicious for a metastasis, 4 mm suspicious segment 3 lesion 4 mm segment 4B lesion consistent with a hemangioma, no abdominal lymphadenopathy, cholelithiasis Ablation of segment 6 and segment 3 lesions 06/19/2020, pathology from segment 6 biopsy confirmed metastatic adenocarcinoma-microsatellite stable, tumor mutation burden 3, K-ras wild type, NRAS wild-type, BRAF [id number] MRI abdomen 08/13/2020-ablation defects in segment 6 and segment 3, tiny foci of restricted diffusion with arterial phase enhancement at the junctions of segment 4A and segment 4B, and segment 5 concerning for metastatic disease CTs 09/09/2020-stable ablation sites at segment 6  and segment 2. Small focus of hyperenhancement measuring 7 mm in the inferior right liver segment 6 anterior to the ablation site, and in the anterior right liver segment 5/8-unchanged compared to MRI and concerning  for metastatic disease, no other evidence of metastatic disease. Scattered irregular groundglass opacities in the posterior right upper lobe Cycle 1 FOLFIRI/Avastin 09/23/2020 Treatment held 10/08/2020 due to neutropenia Cycle 2 FOLFIRI/Avastin 10/14/2020, Udenyca Cycle 3 FOLFIRI/Avastin 10/30/2020, Udenyca Cycle 4 FOLFIRI/Avastin 11/14/2020, Udenyca Cycle 5 FOLFIRI/Avastin 11/28/2020, Udenyca CTs 12/11/2020-no evidence of metastatic disease in the chest, stable liver ablation sites, stable hypervascular lesion in segment 4A, previously described enhancing lesion in the right hepatic lobe not well appreciated, no new lesions\ Real-time ultrasound evaluation by Dr. Laurence Ferrari 12/18/2020-no lesions are visible sonographically, therefore not a candidate for ablation Upper quadrant ultrasound 02/25/2021-no new or progressive hepatic metastases, prior ablation defects noted CTs 06/17/2021-unchanged 1.5 cm mixed attenuation left hepatic lesion, stable heterogenous lesion in the posterior right liver, no evidence of new metastatic disease-for review revealed 1.7 cm nodular lesion at the superior margin of the ablation site in the right liver concerning for recurrent disease CEA mildly elevated 06/17/2021 Ultrasound-guided biopsy of lesion followed by ablation encompassing the entirety of the tumor in the right liver, segment 6,, repeat CT revealed adequate ablation of the region of tumor, 07/30/2021, biopsy adenocarcinoma consistent with metastatic rectal adenocarcinoma CEA normal 08/08/2021 CT renal stone study 08/21/2021-microwave ablation defect in the right liver CT renal stone study 08/24/2021-decrease size of right liver hypodensity at site of prior microwave ablation CTs 10/13/2021-stable appearance of ablation defect in the liver, no evidence of metastatic disease MRI abdomen 01/12/2022-previously ablated liver lesions without enhancement, 3 stable subcentimeter foci of enhancement-likely hemangiomas MRI abdomen  04/10/2022-stable appearance of previously ablated lesions in segment 3 and 6, no sign of progressive disease History of Tobacco use Right UPJ stone on CT 05/24/2018 CT 11/21/2018-migration of previously noted right UPJ stone, new marked right hydroureteronephrosis Placement of a right JJ stent 11/21/2018 Laser lithotripsy and replacement of right JJ stent 12/12/2018 Followed by Dr. Alyson Ingles, urology Migraine headaches Restless legs Arm pain during the oxaliplatin infusion and for 1 week after cycle 1.  The oxaliplatin will be further diluted and infusion time extended getting with cycle 2. Neutropenia secondary to chemotherapy.  Oxaliplatin dose reduced beginning with cycle 2. 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 Alyson Ingles, surgical reimplantation of the right ureter on 08/03/2019, right ureter stent removed 09/14/2019 Thyroid nodules-new versus enlarged left thyroid nodule CT 05/29/2019, thyroid ultrasound 06/20/2019-multiple nodules, 1 right-sided nodule met criteria for ultrasound follow-up Ultrasound 05/08/2020-multinodular goiter, stable nodules in the isthmus and right thyroid meeting criteria for 1 year follow-up, additional stable nodules do not meet criteria for follow-up 12.  Right ureter injury following Foley catheter placement 07/30/2021-pyelogram confirmed extravasation of urine, placement of right ureter stent and Foley catheter 07/31/2021 Right ureter stent removed 09/04/2021 13.  Admission with urosepsis 08/24/2021     Disposition: Norma Colon is in clinical remission from rectal cancer.  She will be scheduled for a surveillance abdomen MRI and follow-up with Dr. Laurence Ferrari in October.  She will return for an office visit here after the MRI.  We continue to follow the CEA and will plan for surveillance CTs in January.   Betsy Coder,  MD  04/30/2022  11:39 AM

## 2022-05-01 ENCOUNTER — Other Ambulatory Visit: Payer: Self-pay

## 2022-06-16 ENCOUNTER — Other Ambulatory Visit: Payer: Self-pay | Admitting: Interventional Radiology

## 2022-06-16 ENCOUNTER — Other Ambulatory Visit: Payer: Self-pay | Admitting: Oncology

## 2022-06-16 DIAGNOSIS — C189 Malignant neoplasm of colon, unspecified: Secondary | ICD-10-CM

## 2022-07-11 ENCOUNTER — Ambulatory Visit (HOSPITAL_COMMUNITY)
Admission: RE | Admit: 2022-07-11 | Discharge: 2022-07-11 | Disposition: A | Discharge status: Home / Self Care | Payer: Commercial Managed Care - HMO | Source: Ambulatory Visit | Attending: Oncology | Admitting: Oncology

## 2022-07-11 DIAGNOSIS — C787 Secondary malignant neoplasm of liver and intrahepatic bile duct: Secondary | ICD-10-CM | POA: Insufficient documentation

## 2022-07-11 DIAGNOSIS — C189 Malignant neoplasm of colon, unspecified: Secondary | ICD-10-CM | POA: Insufficient documentation

## 2022-07-11 MED ORDER — GADOPICLENOL 0.5 MMOL/ML IV SOLN
9.0000 mL | Freq: Once | INTRAVENOUS | Status: AC | PRN
  Administered 2022-07-11: 9 mL via INTRAVENOUS

## 2022-07-23 ENCOUNTER — Encounter: Payer: Self-pay | Admitting: *Deleted

## 2022-07-23 ENCOUNTER — Telehealth: Payer: 59

## 2022-07-23 ENCOUNTER — Ambulatory Visit
Admission: RE | Admit: 2022-07-23 | Discharge: 2022-07-23 | Disposition: A | Discharge status: Home / Self Care | Payer: 59 | Source: Ambulatory Visit | Attending: Interventional Radiology | Admitting: Interventional Radiology

## 2022-07-23 DIAGNOSIS — C189 Malignant neoplasm of colon, unspecified: Secondary | ICD-10-CM

## 2022-07-23 HISTORY — PX: IR RADIOLOGIST EVAL & MGMT: IMG5224

## 2022-07-23 NOTE — Progress Notes (Signed)
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    Referring Physician(s): Julieanne Manson, MD  History of Present Illness: Norma Colon is a 61 y.o. female who initially presented at the kind request of Dr. 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  Norma Colon was initially diagnosed with a partially obstructing rectal mass in August 2019.  She underwent neoadjuvant combined radiation and chemotherapy (Xeloda) from September through October 2019 followed by a low anterior resection in December 2019.     Subsequently, she underwent 4 cycles of additional adjuvant chemotherapy with CAPOX completed in March 2020.  Subsequent surveillance imaging initially detected no evidence of metastatic disease.  Unfortunately, CT imaging from July 2021 demonstrated a subtle lesion in hepatic segment 6 concerning for metastatic disease.  A follow-up MRI dated 05/08/2020 confirms a 2.1 cm solid lesion in hepatic segment 6 as well as a concerning for millimeter lesion in the posterior aspect of segment 3.  Both lesions are concerning for metastatic disease.   She underwent concurrent ultrasound-guided biopsy and microwave ablation of the 2 liver lesions on 06/19/2020.     Follow-up MRI imaging from 08/13/20 showed an excellent treatment effect at the sites of the 2 ablated lesions, but 2 new lesions highly concerning for additional metastatic foci.  Norma Colon therefore went back for additional chemotherapy and has now completed another cycle of FOLFIRI/Avastin on 11/28/2020.  She reported increased malaise following this cycle of chemotherapy.  She had diarrhea and mild nosebleeding.   Restaging CT scans of the chest, abdomen and pelvis dated 12/12/2020 demonstrates no evidence of new metastatic disease or disease outside the liver.  The previously  identified tiny hypervascular lesions in segment 4A and segment 6 are less conspicuous and perhaps slightly smaller consistent with interval treatment response.     US imaging performed by me on  12/18/20 and again on 02/25/21 showed no visible hepatic lesions.    Re-staging CT scans 06/17/21 - New 1.7 cm lesion adjacent to the posterosuperior margin of the segment 6 ablation defect concerning for recurrent disease.    She underwent CT-guided biopsy and repeat microwave ablation of the recurrent tumor on 07/30/2021.  Unfortunately, at that time she sustained an inadvertent injury to the right ureter presumably due to unfortunate Foley catheter placement with in the reimplanted ureter.  Fortunately, we were able to diagnose this within the first day and urology was able to traverse the injury and place a double-J ureteral stent.  She also developed acute kidney injury with elevated creatinine due to to peritoneal reabsorption of the leaking urine.  CT CAP 10/13/2021: Post ablation defects in the liver without evidence of progressive disease or new metastatic disease in the chest, abdomen or pelvis.   MRI Abd 01/12/22: No new suspicious mass or lymphadenopathy identified in the abdomen. Previously ablated liver lesions without suspicious enhancement.   MRI Abd 04/10/22: Stable appearance of previously ablated lesions in segment 3 and 6 of the liver, as above. No signs of local recurrence of disease, and no new metastatic lesions noted in the liver on today's examination.  MRI Abd 07/13/22: Stable size/appearance of the previously treated segment VI and III hepatic lesions, without convincing evidence of recurrent disease. No new suspicious hepatic lesion identified.   Overall, Norma Colon is doing well.   Past Medical History:  Diagnosis Date   Adenocarcinoma of  rectum [facility]) oncologist-- dr Benay Spice--  per last note in epic ,  clinical remission   dx 08/ 2019---- chemoradiation concurrent completed  07-20-2018;  s/p  low anterior resection 09-16-2018;   chemo 10-17-2018  ot 12-19-2018   Anxiety    Arthritis    Chemotherapy induced neutropenia ([place])    Depression    Headache    migraines    History of HPV infection    History of kidney stones    Hydronephrosis, right    Restless leg syndrome    Scoliosis    Sepsis ([place])    11-2018    Past Surgical History:  Procedure Laterality Date   COLONOSCOPY     COLPOSCOPY  04/04/2018   CYSTOSCOPY W/ RETROGRADES Right 07/31/2021   Procedure: [place], URETEROSCOPY AND STENT PLACEMENT;  Surgeon: Janith Lima, MD;  Location: [place];  Service: Urology;  Laterality: Right;   CYSTOSCOPY W/ URETERAL STENT PLACEMENT Right 09/04/2021   Procedure: CYSTOSCOPY WITH RETROGRADE PYELOGRAM WITH STENT REMOVAL;  Surgeon: Cleon Gustin, MD;  Location: [facility];  Service: Urology;  Laterality: Right;   CYSTOSCOPY WITH RETROGRADE PYELOGRAM, URETEROSCOPY AND STENT PLACEMENT Right 12/12/2018   Procedure: CYSTOSCOPY WITH RETROGRADE PYELOGRAM, URETEROSCOPY, STONE EXTRACTION AND STENT PLACEMENT;  Surgeon: Cleon Gustin, MD;  Location: [facility];  Service: Urology;  Laterality: Right;   CYSTOSCOPY WITH RETROGRADE PYELOGRAM, URETEROSCOPY AND STENT PLACEMENT Right 04/17/2019   Procedure: CYSTOSCOPY WITH RETROGRADE PYELOGRAM, BALLOON [place]  URETEROSCOPY AND STENT PLACEMENT;  Surgeon: Cleon Gustin, MD;  Location: [facility];  Service: Urology;  Laterality: Right;   CYSTOSCOPY WITH STENT PLACEMENT Right 11/21/2018   Procedure: CYSTOSCOPY, RIGHT RETROGRADE PYELOGRAM, WITH RIGHT URETERAL STENT PLACEMENT;  Surgeon: Cleon Gustin, MD;  Location: [facility];  Service: Urology;  Laterality: Right;   IR IMAGING GUIDED PORT INSERTION  09/04/2020   IR NEPHROSTOMY PLACEMENT RIGHT  06/22/2019   IR PATIENT EVAL TECH 0-60 MINS  06/28/2019   IR RADIOLOGIST EVAL & MGMT  06/06/2020   IR  RADIOLOGIST EVAL & MGMT  07/17/2020   IR RADIOLOGIST EVAL & MGMT  08/15/2020   IR RADIOLOGIST EVAL & MGMT  09/03/2020   IR RADIOLOGIST EVAL & MGMT  12/18/2020   IR RADIOLOGIST EVAL & MGMT  02/25/2021   IR RADIOLOGIST EVAL & MGMT  06/23/2021   IR RADIOLOGIST EVAL & MGMT  08/12/2021   IR RADIOLOGIST EVAL & MGMT  10/15/2021   IR RADIOLOGIST EVAL & MGMT  01/13/2022   IR RADIOLOGIST EVAL & MGMT  04/14/2022   IR REMOVAL TUN ACCESS W/ PORT W/O FL MOD SED  01/29/2022   left forearm surgery due to calcium rock      RADIOLOGY WITH ANESTHESIA N/A 06/19/2020   Procedure: CT WITH ANESTHESIA MICROWAVE ABLATION;  Surgeon: Jacqulynn Cadet, MD;  Location: [facility];  Service: Anesthesiology;  Laterality: N/A;   RADIOLOGY WITH ANESTHESIA N/A 07/30/2021   Procedure: CT MICROWAVE ABLATION OF THE LIVER;  Surgeon: Criselda Peaches, MD;  Location: [facility];  Service: Radiology;  Laterality: N/A;   ROBOT ASSISTED PYELOPLASTY Right 08/03/2019   Procedure: XI ROBOTIC ASSISTED PYELOPLASTY WITH STENT PLACEMENT;  Surgeon: Cleon Gustin, MD;  Location: [facility];  Service: Urology;  Laterality: Right;  3 HRS   SHOULDER ARTHROSCOPY W/ ROTATOR CUFF REPAIR Right    XI ROBOTIC ASSISTED LOWER ANTERIOR RESECTION N/A 09/16/2018   Procedure: XI ROBOTIC ASSISTED LOWER ANTERIOR RESECTION ERAS PATHWAY;  Surgeon: Marcello Moores,  Elmo Putt, MD;  Location: [facility];  Service: General;  Laterality: N/A;    Allergies: Prednisone and Codeine  Medications: Prior to Admission medications   Medication Sig Start Date End Date Taking? Authorizing Provider  alprazolam Duanne Moron) 2 MG tablet Take 2 mg by mouth every morning.     [provider]  buPROPion (WELLBUTRIN XL) 300 MG 24 hr tablet Take 300 mg by mouth every morning.     [provider]  cyclobenzaprine (FLEXERIL) 10 MG tablet Take 20 mg by mouth at bedtime.    [provider]  HYDROcodone-acetaminophen (NORCO) 10-325 MG tablet Take 1-2 tablets by mouth every 6 (six)  hours as needed for moderate pain or severe pain (headache). Post-Operatively. 09/04/21   McKenzie, Candee Furbish, MD  lubiprostone (AMITIZA) 24 MCG capsule Take 1 capsule (24 mcg total) by mouth 2 (two) times daily with a meal. 12/01/21 05/25/23  Cirigliano, Vito V, DO  Prucalopride Succinate (MOTEGRITY) 2 MG TABS Take 1 tablet (2 mg total) by mouth daily. 10/07/21   Cirigliano, Vito V, DO  rOPINIRole (REQUIP) 1 MG tablet Take 1 mg by mouth at bedtime.    [provider]     Family History  Problem Relation Age of Onset   COPD Father        smoker   Stomach cancer Father        spread to lungs and bones   Arthritis Mother    Heart attack Paternal Grandmother    Heart attack Paternal Grandfather    Heart attack Maternal Grandmother    Stroke Maternal Grandmother    Colon cancer Neg Hx    Rectal cancer Neg Hx    Esophageal cancer Neg Hx     Social History   Socioeconomic History   Marital status: Divorced    Spouse name: Not on file   Number of children: 1   Years of education: Not on file   Highest education level: Not on file  Occupational History   Occupation: Land  Tobacco Use   Smoking status: Former    Packs/day: 1.00    Years: 30.00    Total pack years: 30.00    Types: Cigarettes    Quit date: 09/16/2018    Years since quitting: 3.8   Smokeless tobacco: Never  Vaping Use   Vaping Use: Never used  Substance and Sexual Activity   Alcohol use: Yes    Comment: social   Drug use: Never   Sexual activity: Yes  Other Topics Concern   Not on file  Social History Narrative   Not on file   Social Determinants of Health   Financial Resource Strain: Not on file  Food Insecurity: Not on file  Transportation Needs: Not on file  Physical Activity: Not on file  Stress: Not on file  Social Connections: Not on file    ECOG Status: 0 - Asymptomatic  Review of Systems  Review of Systems: A 12 point ROS discussed and pertinent positives are indicated in the  HPI above.  All other systems are negative.    Physical Exam No direct physical exam was performed (except for noted visual exam findings with Video Visits).    Vital Signs: LMP  (LMP Unknown)   Imaging: MR ABDOMEN WWO CONTRAST  Result Date: 07/13/2022 CLINICAL DATA:  Follow-up treated hepatic metastatic lesions. EXAM: MRI ABDOMEN WITHOUT AND WITH CONTRAST TECHNIQUE: Multiplanar multisequence MR imaging of the abdomen was performed both before and after the administration of intravenous  contrast. CONTRAST:  9 cc of Vueway COMPARISON:  Multiple priors including most recent MRI April 10, 2022. FINDINGS: Examination is considerably limited by patient motion. Within this context: Lower chest: Heterogeneous signal in the lung bases commonly reflects atelectasis Hepatobiliary: Previously treated segment VI hepatic lesion measures 3.2 x 2.5 cm on image 35/21 is intrinsically T2 hypointense and heterogeneously T1 hyperintense without definite suspicious postcontrast enhancement on subtraction imaging. Previously treated segment III hepatic lesion measures 13 x 11 mm on image 35/21 previously 12 x 12 mm, demonstrating intrinsic T1 heterogeneous hyperintensity with T2 hypointensity and no convincing evidence of postcontrast enhancement on subtraction imaging. No new suspicious hepatic lesion identified. Minimal loss of signal on out of phase imaging throughout the hepatic parenchyma compatible with hepatic steatosis. Cholelithiasis without findings of acute cholecystitis. No biliary ductal dilation. Pancreas: No pancreatic ductal dilation or evidence of acute inflammation Spleen:  No splenomegaly or focal splenic lesion. Adrenals/Urinary Tract: No masses identified. No evidence of hydronephrosis. Stomach/Bowel: Visualized portions within the abdomen are unremarkable. Vascular/Lymphatic: No pathologically enlarged lymph nodes identified. No abdominal aortic aneurysm demonstrated. Other:  None. Musculoskeletal: No  suspicious bone lesions identified. IMPRESSION: Examination is considerably limited by patient motion. Within this context: 1. Stable size/appearance of the previously treated segment VI and III hepatic lesions, without convincing evidence of recurrent disease. 2. No new suspicious hepatic lesion identified. 3. Cholelithiasis without findings of acute cholecystitis. 4. Minimal hepatic steatosis. Electronically Signed   By: Dahlia Bailiff M.D.   On: 07/13/2022 12:24    Labs:  CBC: Recent Labs    08/26/21 0905 08/28/21 0722 08/29/21 0458 11/05/21 1438  WBC 4.3 4.4 4.8 6.5  HGB 10.6* 10.8* 10.7* 13.7  HCT 33.1* 32.9* 33.1* 41.8  PLT 184 203 233 270.0    COAGS: No results for input(s): "INR", "APTT" in the last 8760 hours.  BMP: Recent Labs    08/25/21 0426 08/26/21 0905 08/28/21 0722 08/29/21 0458  NA 134* 139 136 139  K 3.0* 4.1 4.2 3.9  CL 104 109 105 108  CO2 23 20* 21* 23  GLUCOSE 110* 111* 85 93  BUN 28* '17 18 17  '$ CALCIUM 7.9* 8.5* 8.6* 9.1  CREATININE 1.21* 0.99 1.00 1.01*  GFRNONAA 52* >60 >60 >60    LIVER FUNCTION TESTS: Recent Labs    08/21/21 2118 08/24/21 1800 08/25/21 0426 08/26/21 0905  BILITOT 0.7 1.1 0.6 0.5  AST '19 29 24 '$ 36  ALT '22 24 22 29  '$ ALKPHOS 99 95 78 78  PROT 7.5 8.2* 6.7 7.3  ALBUMIN 3.9 3.6 3.0* 3.1*    TUMOR MARKERS: Recent Labs    09/18/21 1345 11/04/21 1420 01/27/22 1130 04/30/22 1055  CEA 2.30 1.63 1.81 1.53    Assessment and Plan:  61 year old female with colon cancer metastatic to the liver status post percutaneous microwave ablation x2.  She is now doing well at 12 months post procedure with no evidence of residual or recurrent disease.  We will continue routine imaging surveillance. Dr. Benay Spice has plans for surveillance scans in January so we will repeat MRI in 6 months time.   1.) Repeat liver MRI with contrast in April 2024 with clinic visit.   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    Visit type: Audio only (telephone). Audio (no video) only due  to patient preference. Alternative for in-person consultation at [facility], [address] [place], [place], [place]. This visit type was conducted due to national recommendations for restrictions regarding the COVID-19 Pandemic (e.g. social distancing).  This format is felt to be most appropriate for this patient at this time.  All issues noted in this document were discussed and addressed.

## 2022-07-30 ENCOUNTER — Telehealth: Payer: Self-pay | Admitting: *Deleted

## 2022-07-30 ENCOUNTER — Inpatient Hospital Stay (HOSPITAL_BASED_OUTPATIENT_CLINIC_OR_DEPARTMENT_OTHER): Payer: Commercial Managed Care - PPO | Admitting: Oncology

## 2022-07-30 ENCOUNTER — Other Ambulatory Visit: Payer: Self-pay

## 2022-07-30 ENCOUNTER — Inpatient Hospital Stay: Payer: Commercial Managed Care - PPO

## 2022-07-30 ENCOUNTER — Inpatient Hospital Stay: Payer: Commercial Managed Care - PPO | Attending: Oncology

## 2022-07-30 VITALS — BP 140/86 | HR 86 | Temp 98.1°F | Resp 18 | Ht 68.0 in | Wt 202.8 lb

## 2022-07-30 DIAGNOSIS — Z23 Encounter for immunization: Secondary | ICD-10-CM | POA: Insufficient documentation

## 2022-07-30 DIAGNOSIS — R300 Dysuria: Secondary | ICD-10-CM

## 2022-07-30 DIAGNOSIS — C20 Malignant neoplasm of rectum: Secondary | ICD-10-CM

## 2022-07-30 DIAGNOSIS — C787 Secondary malignant neoplasm of liver and intrahepatic bile duct: Secondary | ICD-10-CM

## 2022-07-30 LAB — URINALYSIS, COMPLETE (UACMP) WITH MICROSCOPIC
Bilirubin Urine: NEGATIVE
Glucose, UA: NEGATIVE mg/dL
Hgb urine dipstick: NEGATIVE
Ketones, ur: NEGATIVE mg/dL
Nitrite: NEGATIVE
Specific Gravity, Urine: 1.029 (ref 1.005–1.030)
pH: 5.5 (ref 5.0–8.0)

## 2022-07-30 LAB — CEA (ACCESS): CEA (CHCC): 1.43 ng/mL (ref 0.00–5.00)

## 2022-07-30 MED ORDER — INFLUENZA VAC SPLIT QUAD 0.5 ML IM SUSY: 0.5000 mL | PREFILLED_SYRINGE | Freq: Once | INTRAMUSCULAR | Status: DC

## 2022-07-30 MED ORDER — INFLUENZA VAC SPLIT QUAD 0.5 ML IM SUSY
0.5000 mL | PREFILLED_SYRINGE | Freq: Once | INTRAMUSCULAR | Status: AC
  Administered 2022-07-30: 0.5 mL via INTRAMUSCULAR
  Filled 2022-07-30: qty 0.5

## 2022-07-30 NOTE — Progress Notes (Signed)
[facility] OFFICE PROGRESS NOTE   Diagnosis: Rectal cancer  INTERVAL HISTORY:   Norma Colon returns as scheduled.  She felt terrible for the past several days due to a migraine headache.  She relates this to stress regarding her divorce.  She has pelvic discomfort today and feels she may have a urinary tract infection.  Good appetite.  She reports intentional weight loss.  Objective:  Vital signs in last 24 hours:  Blood pressure (!) 140/86, pulse 86, temperature 98.1 F (36.7 C), temperature source Oral, resp. rate 18, height _0  (1.727 m), weight 202 lb 12.8 oz (92 kg), SpO2 98 %.    Lymphatics: No cervical, supraclavicular, axillary, or inguinal nodes Resp: Scattered end inspiratory coarse rhonchi at the bilateral posterior chest, no respiratory distress Cardio: Regular rate and rhythm GI: No mass, nontender, no hepatosplenomegaly Vascular: No leg edema  Lab Results:  Lab Results  Component Value Date   WBC 6.5 11/05/2021   HGB 13.7 11/05/2021   HCT 41.8 11/05/2021   MCV 93.1 11/05/2021   PLT 270.0 11/05/2021   NEUTROABS 2.6 08/26/2021    CMP  Lab Results  Component Value Date   NA 139 08/29/2021   K 3.9 08/29/2021   CL 108 08/29/2021   CO2 23 08/29/2021   GLUCOSE 93 08/29/2021   BUN 17 08/29/2021   CREATININE 1.01 (H) 08/29/2021   CALCIUM 9.1 08/29/2021   PROT 7.3 08/26/2021   ALBUMIN 3.1 (L) 08/26/2021   AST 36 08/26/2021   ALT 29 08/26/2021   ALKPHOS 78 08/26/2021   BILITOT 0.5 08/26/2021   GFRNONAA >60 08/29/2021   GFRAA >60 06/17/2020    Lab Results  Component Value Date   CEA1 6.85 (H) 06/17/2021   CEA 1.43 07/30/2022    Medications: I have reviewed the patient's current medications.   Assessment/Plan: Rectal cancer Colonoscopy 05/17/2018 revealed a partially obstructing mass beginning at 10 cm from the anal verge, biopsy confirmed invasive moderately differentiated adenocarcinoma CTs 05/24/2018-mass beginning at proximal  he 7.6 cm from the anal verge, "20 "perirectal lymph nodes, no evidence of metastatic disease Pelvic MRI 06/01/2018- tumor measured at 10.8 cm from the anal sphincter, T3b, N1-2 left perirectal lymph nodes, each measuring 5 mm Radiation and Xeloda initiated 06/13/2018, completed 07/20/2018 Low anterior resection 09/16/2018,ypT3,ypN1c, 1 cm tumor, partial response-score 2, 0/13 lymph nodes positive, 2 tumor deposits, MSI-stable, no loss of mismatch repair protein expression Cycle 1 CAPOX 10/17/2018 Cycle 2 CAPOX 11/07/2018 (oxaliplatin dose reduced due to neutropenia) Cycle 3 CAPOX 11/28/2018 Cycle 4 CAPOX 12/19/2018 CTs 05/29/2019-no evidence of metastatic disease, severe right hydronephrosis, left thyroid nodule CTs 04/11/2020-subtle nodular lesion in the inferior right liver suspicious for a new metastasis, status post right ureteral resection and psoas hitch/flap, resolution of hydronephrosis, sacral insufficiency fractures bilaterally MRI liver 05/08/2020-2.1 cm segment 6 lesion suspicious for a metastasis, 4 mm suspicious segment 3 lesion 4 mm segment 4B lesion consistent with a hemangioma, no abdominal lymphadenopathy, cholelithiasis Ablation of segment 6 and segment 3 lesions 06/19/2020, pathology from segment 6 biopsy confirmed metastatic adenocarcinoma-microsatellite stable, tumor mutation burden 3, K-ras wild type, NRAS wild-type, BRAF [id number] MRI abdomen 08/13/2020-ablation defects in segment 6 and segment 3, tiny foci of restricted diffusion with arterial phase enhancement at the junctions of segment 4A and segment 4B, and segment 5 concerning for metastatic disease CTs 09/09/2020-stable ablation sites at segment 6 and segment 2. Small focus of hyperenhancement measuring 7 mm in the inferior right liver segment 6 anterior to  the ablation site, and in the anterior right liver segment 5/8-unchanged compared to MRI and concerning for metastatic disease, no other evidence of metastatic disease. Scattered  irregular groundglass opacities in the posterior right upper lobe Cycle 1 FOLFIRI/Avastin 09/23/2020 Treatment held 10/08/2020 due to neutropenia Cycle 2 FOLFIRI/Avastin 10/14/2020, Udenyca Cycle 3 FOLFIRI/Avastin 10/30/2020, Udenyca Cycle 4 FOLFIRI/Avastin 11/14/2020, Udenyca Cycle 5 FOLFIRI/Avastin 11/28/2020, Udenyca CTs 12/11/2020-no evidence of metastatic disease in the chest, stable liver ablation sites, stable hypervascular lesion in segment 4A, previously described enhancing lesion in the right hepatic lobe not well appreciated, no new lesions\ Real-time ultrasound evaluation by Dr. Laurence Ferrari 12/18/2020-no lesions are visible sonographically, therefore not a candidate for ablation Upper quadrant ultrasound 02/25/2021-no new or progressive hepatic metastases, prior ablation defects noted CTs 06/17/2021-unchanged 1.5 cm mixed attenuation left hepatic lesion, stable heterogenous lesion in the posterior right liver, no evidence of new metastatic disease-for review revealed 1.7 cm nodular lesion at the superior margin of the ablation site in the right liver concerning for recurrent disease CEA mildly elevated 06/17/2021 Ultrasound-guided biopsy of lesion followed by ablation encompassing the entirety of the tumor in the right liver, segment 6,, repeat CT revealed adequate ablation of the region of tumor, 07/30/2021, biopsy adenocarcinoma consistent with metastatic rectal adenocarcinoma CEA normal 08/08/2021 CT renal stone study 08/21/2021-microwave ablation defect in the right liver CT renal stone study 08/24/2021-decrease size of right liver hypodensity at site of prior microwave ablation CTs 10/13/2021-stable appearance of ablation defect in the liver, no evidence of metastatic disease MRI abdomen 01/12/2022-previously ablated liver lesions without enhancement, 3 stable subcentimeter foci of enhancement-likely hemangiomas MRI abdomen 04/10/2022-stable appearance of previously ablated lesions in segment 3 and  6, no sign of progressive disease MRI abdomen 07/11/2022-stable appearance of previously treated segment 6 and 3 hepatic lesions, no evidence of recurrent disease, no new lesion History of Tobacco use Right UPJ stone on CT 05/24/2018 CT 11/21/2018-migration of previously noted right UPJ stone, new marked right hydroureteronephrosis Placement of a right JJ stent 11/21/2018 Laser lithotripsy and replacement of right JJ stent 12/12/2018 Followed by Dr. Alyson Ingles, urology Migraine headaches Restless legs Arm pain during the oxaliplatin infusion and for 1 week after cycle 1.  The oxaliplatin will be further diluted and infusion time extended getting with cycle 2. Neutropenia secondary to chemotherapy.  Oxaliplatin dose reduced beginning with cycle 2. 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 Alyson Ingles, surgical reimplantation of the right ureter on 08/03/2019, right ureter stent removed 09/14/2019 Thyroid nodules-new versus enlarged left thyroid nodule CT 05/29/2019, thyroid ultrasound 06/20/2019-multiple nodules, 1 right-sided nodule met criteria for ultrasound follow-up Ultrasound 05/08/2020-multinodular goiter, stable nodules in the isthmus and right thyroid meeting criteria for 1 year follow-up, additional stable nodules do not meet criteria for follow-up 12.  Right ureter injury following Foley catheter placement 07/30/2021-pyelogram confirmed extravasation of urine, placement of right ureter stent and Foley catheter 07/31/2021 Right ureter stent removed 09/04/2021 13.  Admission with urosepsis 08/24/2021      Disposition: Norma Colon is in clinical remission from rectal cancer.  The restaging liver MRI reveals no evidence of progressive disease.  She will return for an office visit and restaging CTs in 3 months.  She will have a repeat liver MRI in 6 months.  Norma Colon  will receive an influenza vaccine today.  A urinalysis is negative for evidence of a urinary tract infection.  Betsy Coder, MD  07/30/2022  3:57 PM

## 2022-07-30 NOTE — Patient Instructions (Signed)

## 2022-07-30 NOTE — Telephone Encounter (Signed)
Error

## 2022-10-27 ENCOUNTER — Inpatient Hospital Stay: Payer: Commercial Managed Care - PPO | Attending: Oncology

## 2022-10-27 ENCOUNTER — Ambulatory Visit (HOSPITAL_BASED_OUTPATIENT_CLINIC_OR_DEPARTMENT_OTHER)
Admission: RE | Admit: 2022-10-27 | Discharge: 2022-10-27 | Disposition: A | Discharge status: Home / Self Care | Payer: Commercial Managed Care - PPO | Source: Ambulatory Visit | Attending: Oncology | Admitting: Oncology

## 2022-10-27 DIAGNOSIS — C20 Malignant neoplasm of rectum: Secondary | ICD-10-CM | POA: Insufficient documentation

## 2022-10-27 DIAGNOSIS — C787 Secondary malignant neoplasm of liver and intrahepatic bile duct: Secondary | ICD-10-CM | POA: Insufficient documentation

## 2022-10-27 DIAGNOSIS — T451X5A Adverse effect of antineoplastic and immunosuppressive drugs, initial encounter: Secondary | ICD-10-CM | POA: Insufficient documentation

## 2022-10-27 DIAGNOSIS — G620 Drug-induced polyneuropathy: Secondary | ICD-10-CM | POA: Insufficient documentation

## 2022-10-27 DIAGNOSIS — G43909 Migraine, unspecified, not intractable, without status migrainosus: Secondary | ICD-10-CM | POA: Insufficient documentation

## 2022-10-27 DIAGNOSIS — R42 Dizziness and giddiness: Secondary | ICD-10-CM | POA: Insufficient documentation

## 2022-10-27 DIAGNOSIS — E041 Nontoxic single thyroid nodule: Secondary | ICD-10-CM | POA: Insufficient documentation

## 2022-10-27 DIAGNOSIS — Z87891 Personal history of nicotine dependence: Secondary | ICD-10-CM | POA: Insufficient documentation

## 2022-10-27 LAB — BASIC METABOLIC PANEL - CANCER CENTER ONLY
Anion gap: 8 (ref 5–15)
BUN: 19 mg/dL (ref 6–20)
CO2: 27 mmol/L (ref 22–32)
Calcium: 10.6 mg/dL — ABNORMAL HIGH (ref 8.9–10.3)
Chloride: 103 mmol/L (ref 98–111)
Creatinine: 1.12 mg/dL — ABNORMAL HIGH (ref 0.44–1.00)
GFR, Estimated: 57 mL/min — ABNORMAL LOW (ref >60)
Glucose, Bld: 102 mg/dL — ABNORMAL HIGH (ref 70–99)
Potassium: 4.1 mmol/L (ref 3.5–5.1)
Sodium: 138 mmol/L (ref 135–145)

## 2022-10-27 LAB — CEA (ACCESS): CEA (CHCC): 1.84 ng/mL (ref 0.00–5.00)

## 2022-10-27 MED ORDER — IOHEXOL 300 MG/ML  SOLN
100.0000 mL | Freq: Once | INTRAMUSCULAR | Status: AC | PRN
  Administered 2022-10-27: 100 mL via INTRAVENOUS

## 2022-10-29 ENCOUNTER — Inpatient Hospital Stay: Payer: Commercial Managed Care - PPO | Admitting: Oncology

## 2022-10-30 ENCOUNTER — Telehealth: Payer: Self-pay | Admitting: *Deleted

## 2022-10-30 DIAGNOSIS — C20 Malignant neoplasm of rectum: Secondary | ICD-10-CM

## 2022-10-30 NOTE — Telephone Encounter (Signed)
Norma Colon made aware of slight elevation in calcium on recent labs. She is unsure if she takes a calcium supplement. She takes several vitamins and not sure of Ca+. Will check over the weekend. She will come at 1020 for labs prior to visit on Monday with Dr. Benay Spice.

## 2022-11-02 ENCOUNTER — Inpatient Hospital Stay (HOSPITAL_BASED_OUTPATIENT_CLINIC_OR_DEPARTMENT_OTHER): Payer: Commercial Managed Care - PPO | Admitting: Oncology

## 2022-11-02 ENCOUNTER — Inpatient Hospital Stay: Payer: Commercial Managed Care - PPO

## 2022-11-02 ENCOUNTER — Other Ambulatory Visit: Payer: Self-pay | Admitting: *Deleted

## 2022-11-02 VITALS — BP 121/90 | HR 95 | Temp 98.2°F | Resp 20 | Ht 68.0 in | Wt 207.0 lb

## 2022-11-02 DIAGNOSIS — C20 Malignant neoplasm of rectum: Secondary | ICD-10-CM

## 2022-11-02 DIAGNOSIS — G43909 Migraine, unspecified, not intractable, without status migrainosus: Secondary | ICD-10-CM | POA: Diagnosis not present

## 2022-11-02 DIAGNOSIS — T451X5A Adverse effect of antineoplastic and immunosuppressive drugs, initial encounter: Secondary | ICD-10-CM | POA: Diagnosis not present

## 2022-11-02 DIAGNOSIS — R42 Dizziness and giddiness: Secondary | ICD-10-CM | POA: Diagnosis not present

## 2022-11-02 DIAGNOSIS — G620 Drug-induced polyneuropathy: Secondary | ICD-10-CM | POA: Diagnosis not present

## 2022-11-02 DIAGNOSIS — Z87891 Personal history of nicotine dependence: Secondary | ICD-10-CM | POA: Diagnosis not present

## 2022-11-02 DIAGNOSIS — E041 Nontoxic single thyroid nodule: Secondary | ICD-10-CM | POA: Diagnosis not present

## 2022-11-02 LAB — CMP (CANCER CENTER ONLY)
ALT: 38 U/L (ref 0–44)
AST: 20 U/L (ref 15–41)
Albumin: 4.3 g/dL (ref 3.5–5.0)
Alkaline Phosphatase: 107 U/L (ref 38–126)
Anion gap: 9 (ref 5–15)
BUN: 23 mg/dL — ABNORMAL HIGH (ref 6–20)
CO2: 25 mmol/L (ref 22–32)
Calcium: 9.6 mg/dL (ref 8.9–10.3)
Chloride: 104 mmol/L (ref 98–111)
Creatinine: 1.06 mg/dL — ABNORMAL HIGH (ref 0.44–1.00)
GFR, Estimated: >60 mL/min (ref >60)
Glucose, Bld: 154 mg/dL — ABNORMAL HIGH (ref 70–99)
Potassium: 3.8 mmol/L (ref 3.5–5.1)
Sodium: 138 mmol/L (ref 135–145)
Total Bilirubin: 0.6 mg/dL (ref 0.3–1.2)
Total Protein: 7.6 g/dL (ref 6.5–8.1)

## 2022-11-02 MED ORDER — GABAPENTIN 300 MG PO CAPS: 300.0000 mg | ORAL_CAPSULE | Freq: Two times a day (BID) | ORAL | 3 refills | Status: DC

## 2022-11-02 NOTE — Progress Notes (Signed)
Gabapentin script to [facility] per Dr. Benay Spice orders.

## 2022-11-02 NOTE — Progress Notes (Signed)
[place] OFFICE PROGRESS NOTE   Diagnosis: Rectal cancer  INTERVAL HISTORY:   [facility] Colon returns as scheduled.  She continues to have numbness discomfort at the soles of the feet.  She has chronic pain at the right flank.  She has been unable to lose weight with a change in her diet.  She has chronic constipation.  Ms. Karrer reports "dizziness "for the past several weeks.  She has chronic headaches.  Objective:  Vital signs in last 24 hours:  Blood pressure (!) 121/90, pulse 95, temperature 98.2 F (36.8 C), temperature source Oral, resp. rate 20, height '5\' 8"'$  (1.727 m), weight 207 lb (93.9 kg), SpO2 99 %.     Lymphatics: No cervical, supraclavicular, axillary, or inguinal nodes Resp: Lungs clear bilaterally Cardio: Regular rate and rhythm GI: No hepatosplenomegaly, no mass, nontender Vascular: No leg edema Musculoskeletal: No tenderness at the right flank, discomfort at the right upper posterior iliac region   Lab Results:  Lab Results  Component Value Date   WBC 6.5 11/05/2021   HGB 13.7 11/05/2021   HCT 41.8 11/05/2021   MCV 93.1 11/05/2021   PLT 270.0 11/05/2021   NEUTROABS 2.6 08/26/2021    CMP  Lab Results  Component Value Date   NA 138 11/02/2022   K 3.8 11/02/2022   CL 104 11/02/2022   CO2 25 11/02/2022   GLUCOSE 154 (H) 11/02/2022   BUN 23 (H) 11/02/2022   CREATININE 1.06 (H) 11/02/2022   CALCIUM 9.6 11/02/2022   PROT 7.6 11/02/2022   ALBUMIN 4.3 11/02/2022   AST 20 11/02/2022   ALT 38 11/02/2022   ALKPHOS 107 11/02/2022   BILITOT 0.6 11/02/2022   GFRNONAA >60 11/02/2022   GFRAA >60 06/17/2020    Lab Results  Component Value Date   CEA1 6.85 (H) 06/17/2021   CEA 1.84 10/27/2022     Medications: I have reviewed the patient's current medications.   Assessment/Plan: Rectal cancer Colonoscopy 05/17/2018 revealed a partially obstructing mass beginning at 10 cm from the anal verge, biopsy confirmed invasive moderately  differentiated adenocarcinoma CTs 05/24/2018-mass beginning at proximal he 7.6 cm from the anal verge, "20 "perirectal lymph nodes, no evidence of metastatic disease Pelvic MRI 06/01/2018- tumor measured at 10.8 cm from the anal sphincter, T3b, N1-2 left perirectal lymph nodes, each measuring 5 mm Radiation and Xeloda initiated 06/13/2018, completed 07/20/2018 Low anterior resection 09/16/2018,ypT3,ypN1c, 1 cm tumor, partial response-score 2, 0/13 lymph nodes positive, 2 tumor deposits, MSI-stable, no loss of mismatch repair protein expression Cycle 1 CAPOX 10/17/2018 Cycle 2 CAPOX 11/07/2018 (oxaliplatin dose reduced due to neutropenia) Cycle 3 CAPOX 11/28/2018 Cycle 4 CAPOX 12/19/2018 CTs 05/29/2019-no evidence of metastatic disease, severe right hydronephrosis, left thyroid nodule CTs 04/11/2020-subtle nodular lesion in the inferior right liver suspicious for a new metastasis, status post right ureteral resection and psoas hitch/flap, resolution of hydronephrosis, sacral insufficiency fractures bilaterally MRI liver 05/08/2020-2.1 cm segment 6 lesion suspicious for a metastasis, 4 mm suspicious segment 3 lesion 4 mm segment 4B lesion consistent with a hemangioma, no abdominal lymphadenopathy, cholelithiasis Ablation of segment 6 and segment 3 lesions 06/19/2020, pathology from segment 6 biopsy confirmed metastatic adenocarcinoma-microsatellite stable, tumor mutation burden 3, K-ras wild type, NRAS wild-type, BRAF [id number] MRI abdomen 08/13/2020-ablation defects in segment 6 and segment 3, tiny foci of restricted diffusion with arterial phase enhancement at the junctions of segment 4A and segment 4B, and segment 5 concerning for metastatic disease CTs 09/09/2020-stable ablation sites at segment 6 and segment 2.  Small focus of hyperenhancement measuring 7 mm in the inferior right liver segment 6 anterior to the ablation site, and in the anterior right liver segment 5/8-unchanged compared to MRI and concerning for  metastatic disease, no other evidence of metastatic disease. Scattered irregular groundglass opacities in the posterior right upper lobe Cycle 1 FOLFIRI/Avastin 09/23/2020 Treatment held 10/08/2020 due to neutropenia Cycle 2 FOLFIRI/Avastin 10/14/2020, Udenyca Cycle 3 FOLFIRI/Avastin 10/30/2020, Udenyca Cycle 4 FOLFIRI/Avastin 11/14/2020, Udenyca Cycle 5 FOLFIRI/Avastin 11/28/2020, Udenyca CTs 12/11/2020-no evidence of metastatic disease in the chest, stable liver ablation sites, stable hypervascular lesion in segment 4A, previously described enhancing lesion in the right hepatic lobe not well appreciated, no new lesions\ Real-time ultrasound evaluation by Dr. Laurence Ferrari 12/18/2020-no lesions are visible sonographically, therefore not a candidate for ablation Upper quadrant ultrasound 02/25/2021-no new or progressive hepatic metastases, prior ablation defects noted CTs 06/17/2021-unchanged 1.5 cm mixed attenuation left hepatic lesion, stable heterogenous lesion in the posterior right liver, no evidence of new metastatic disease-for review revealed 1.7 cm nodular lesion at the superior margin of the ablation site in the right liver concerning for recurrent disease CEA mildly elevated 06/17/2021 Ultrasound-guided biopsy of lesion followed by ablation encompassing the entirety of the tumor in the right liver, segment 6,, repeat CT revealed adequate ablation of the region of tumor, 07/30/2021, biopsy adenocarcinoma consistent with metastatic rectal adenocarcinoma CEA normal 08/08/2021 CT renal stone study 08/21/2021-microwave ablation defect in the right liver CT renal stone study 08/24/2021-decrease size of right liver hypodensity at site of prior microwave ablation CTs 10/13/2021-stable appearance of ablation defect in the liver, no evidence of metastatic disease MRI abdomen 01/12/2022-previously ablated liver lesions without enhancement, 3 stable subcentimeter foci of enhancement-likely hemangiomas MRI abdomen  04/10/2022-stable appearance of previously ablated lesions in segment 3 and 6, no sign of progressive disease MRI abdomen 07/11/2022-stable appearance of previously treated segment 6 and 3 hepatic lesions, no evidence of recurrent disease, no new lesion CTs 10/27/2022-decrease size of treated hepatic lesions, no evidence for residual disease, no evidence of progressive metastatic disease stable multinodular enlargement of the thyroid History of Tobacco use Right UPJ stone on CT 05/24/2018 CT 11/21/2018-migration of previously noted right UPJ stone, new marked right hydroureteronephrosis Placement of a right JJ stent 11/21/2018 Laser lithotripsy and replacement of right JJ stent 12/12/2018 Followed by Dr. Alyson Ingles, urology Migraine headaches Restless legs Arm pain during the oxaliplatin infusion and for 1 week after cycle 1.  The oxaliplatin will be further diluted and infusion time extended getting with cycle 2. Neutropenia secondary to chemotherapy.  Oxaliplatin dose reduced beginning with cycle 2. 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 Alyson Ingles, surgical reimplantation of the right ureter on 08/03/2019, right ureter stent removed 09/14/2019 Thyroid nodules-new versus enlarged left thyroid nodule CT 05/29/2019, thyroid ultrasound 06/20/2019-multiple nodules, 1 right-sided nodule met criteria for ultrasound follow-up Ultrasound 05/08/2020-multinodular goiter, stable nodules in the isthmus and right thyroid meeting criteria for 1 year follow-up, additional stable nodules do not meet criteria for follow-up 12.  Right ureter injury following Foley catheter placement 07/30/2021-pyelogram confirmed extravasation of urine, placement of right ureter stent and Foley catheter 07/31/2021 Right ureter stent removed 09/04/2021 13.  Admission with urosepsis  08/24/2021       Disposition: Ms. Newsome is in clinical remission from rectal cancer.  The restaging CTs show no evidence of disease progression.  The CEA is normal.  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  We reviewed potential toxicities associated with gabapentin including the chance of somnolence.  She will be scheduled for a surveillance MRI of the liver in April.  She will return for an office visit a few days after the MRI.  She will follow-up with Dr. Helane Rima if the dizziness persists.  She will be scheduled for a thyroid ultrasound to follow-up on previously noted thyroid nodules.  Betsy Coder, MD  11/02/2022  11:16 AM

## 2022-11-03 LAB — PTH, INTACT AND CALCIUM
Calcium, Total (PTH): 8.9 mg/dL (ref 8.7–10.2)
PTH: 22 pg/mL (ref 15–65)

## 2022-12-08 ENCOUNTER — Other Ambulatory Visit: Payer: Self-pay | Admitting: Obstetrics and Gynecology

## 2022-12-08 DIAGNOSIS — R928 Other abnormal and inconclusive findings on diagnostic imaging of breast: Secondary | ICD-10-CM

## 2022-12-22 ENCOUNTER — Ambulatory Visit
Admission: RE | Admit: 2022-12-22 | Discharge: 2022-12-22 | Disposition: A | Discharge status: Home / Self Care | Payer: Commercial Managed Care - PPO | Source: Ambulatory Visit | Attending: Obstetrics and Gynecology | Admitting: Obstetrics and Gynecology

## 2022-12-22 ENCOUNTER — Ambulatory Visit: Payer: Medicaid - Out of State

## 2022-12-22 DIAGNOSIS — R928 Other abnormal and inconclusive findings on diagnostic imaging of breast: Secondary | ICD-10-CM

## 2023-01-08 ENCOUNTER — Other Ambulatory Visit: Payer: Self-pay | Admitting: Interventional Radiology

## 2023-01-08 DIAGNOSIS — C787 Secondary malignant neoplasm of liver and intrahepatic bile duct: Secondary | ICD-10-CM

## 2023-01-08 DIAGNOSIS — C189 Malignant neoplasm of colon, unspecified: Secondary | ICD-10-CM

## 2023-01-27 ENCOUNTER — Ambulatory Visit
Admission: RE | Admit: 2023-01-27 | Discharge: 2023-01-27 | Disposition: A | Discharge status: Home / Self Care | Payer: Commercial Managed Care - PPO | Source: Ambulatory Visit | Attending: Oncology | Admitting: Oncology

## 2023-01-27 DIAGNOSIS — C20 Malignant neoplasm of rectum: Secondary | ICD-10-CM

## 2023-01-27 MED ORDER — GADOPICLENOL 0.5 MMOL/ML IV SOLN
10.0000 mL | Freq: Once | INTRAVENOUS | Status: AC | PRN
  Administered 2023-01-27: 10 mL via INTRAVENOUS

## 2023-01-29 ENCOUNTER — Other Ambulatory Visit (HOSPITAL_BASED_OUTPATIENT_CLINIC_OR_DEPARTMENT_OTHER): Payer: Self-pay

## 2023-01-29 ENCOUNTER — Inpatient Hospital Stay: Payer: Commercial Managed Care - PPO

## 2023-01-29 ENCOUNTER — Inpatient Hospital Stay: Payer: Commercial Managed Care - PPO | Attending: Oncology | Admitting: Oncology

## 2023-01-29 VITALS — BP 140/88 | HR 96 | Temp 98.1°F | Resp 18 | Ht 68.0 in | Wt 207.0 lb

## 2023-01-29 DIAGNOSIS — C787 Secondary malignant neoplasm of liver and intrahepatic bile duct: Secondary | ICD-10-CM | POA: Diagnosis not present

## 2023-01-29 DIAGNOSIS — G43909 Migraine, unspecified, not intractable, without status migrainosus: Secondary | ICD-10-CM | POA: Insufficient documentation

## 2023-01-29 DIAGNOSIS — Z87891 Personal history of nicotine dependence: Secondary | ICD-10-CM | POA: Insufficient documentation

## 2023-01-29 DIAGNOSIS — Z85048 Personal history of other malignant neoplasm of rectum, rectosigmoid junction, and anus: Secondary | ICD-10-CM | POA: Diagnosis present

## 2023-01-29 DIAGNOSIS — C20 Malignant neoplasm of rectum: Secondary | ICD-10-CM | POA: Diagnosis not present

## 2023-01-29 DIAGNOSIS — G2581 Restless legs syndrome: Secondary | ICD-10-CM | POA: Insufficient documentation

## 2023-01-29 LAB — CEA (ACCESS): CEA (CHCC): 1.49 ng/mL (ref 0.00–5.00)

## 2023-01-29 MED ORDER — HYDROCODONE-ACETAMINOPHEN 10-325 MG PO TABS
1.0000 | ORAL_TABLET | Freq: Four times a day (QID) | ORAL | 0 refills | Status: DC | PRN
  Filled 2023-01-29: qty 30, 8d supply, fill #0

## 2023-01-29 NOTE — Progress Notes (Signed)
[facility] OFFICE PROGRESS NOTE   Diagnosis: Rectal cancer  INTERVAL HISTORY:   [facility] Colon returns as scheduled.  Constipation is improved with an over-the-counter medication.  She is no longer taking magnesium.  She reports discomfort at the left upper back beginning today.  She continues to have intermittent pain at the right flank.  Objective:  Vital signs in last 24 hours:  Blood pressure (!) 140/88, pulse 96, temperature 98.1 F (36.7 C), temperature source Oral, resp. rate 18, height 5\' 8"  (1.727 m), weight 207 lb (93.9 kg), SpO2 100 %.    Lymphatics: No cervical, supraclavicular, axillary, or inguinal nodes Resp: Lungs clear bilaterally Cardio: Regular rate and rhythm GI: No hepatosplenomegaly, no mass, nontender Vascular: No leg edema Musculoskeletal: Tenderness at the left upper back at the level of the scapula.  The tenderness appears to be over the posterior lateral back musculature.  No mass.  No rash. Lab Results:  Lab Results  Component Value Date   WBC 6.5 11/05/2021   HGB 13.7 11/05/2021   HCT 41.8 11/05/2021   MCV 93.1 11/05/2021   PLT 270.0 11/05/2021   NEUTROABS 2.6 08/26/2021    CMP  Lab Results  Component Value Date   NA 138 11/02/2022   K 3.8 11/02/2022   CL 104 11/02/2022   CO2 25 11/02/2022   GLUCOSE 154 (H) 11/02/2022   BUN 23 (H) 11/02/2022   CREATININE 1.06 (H) 11/02/2022   CALCIUM 8.9 11/02/2022   CALCIUM 9.6 11/02/2022   PROT 7.6 11/02/2022   ALBUMIN 4.3 11/02/2022   AST 20 11/02/2022   ALT 38 11/02/2022   ALKPHOS 107 11/02/2022   BILITOT 0.6 11/02/2022   GFRNONAA >60 11/02/2022   GFRAA >60 06/17/2020    Lab Results  Component Value Date   CEA1 6.85 (H) 06/17/2021   CEA 1.84 10/27/2022      Medications: I have reviewed the patient's current medications.   Assessment/Plan: Rectal cancer Colonoscopy 05/17/2018 revealed a partially obstructing mass beginning at 10 cm from the anal verge, biopsy confirmed  invasive moderately differentiated adenocarcinoma CTs 05/24/2018-mass beginning at proximal he 7.6 cm from the anal verge, "20 "perirectal lymph nodes, no evidence of metastatic disease Pelvic MRI 06/01/2018- tumor measured at 10.8 cm from the anal sphincter, T3b, N1-2 left perirectal lymph nodes, each measuring 5 mm Radiation and Xeloda initiated 06/13/2018, completed 07/20/2018 Low anterior resection 09/16/2018,ypT3,ypN1c, 1 cm tumor, partial response-score 2, 0/13 lymph nodes positive, 2 tumor deposits, MSI-stable, no loss of mismatch repair protein expression Cycle 1 CAPOX 10/17/2018 Cycle 2 CAPOX 11/07/2018 (oxaliplatin dose reduced due to neutropenia) Cycle 3 CAPOX 11/28/2018 Cycle 4 CAPOX 12/19/2018 CTs 05/29/2019-no evidence of metastatic disease, severe right hydronephrosis, left thyroid nodule CTs 04/11/2020-subtle nodular lesion in the inferior right liver suspicious for a new metastasis, status post right ureteral resection and psoas hitch/flap, resolution of hydronephrosis, sacral insufficiency fractures bilaterally MRI liver 05/08/2020-2.1 cm segment 6 lesion suspicious for a metastasis, 4 mm suspicious segment 3 lesion 4 mm segment 4B lesion consistent with a hemangioma, no abdominal lymphadenopathy, cholelithiasis Ablation of segment 6 and segment 3 lesions 06/19/2020, pathology from segment 6 biopsy confirmed metastatic adenocarcinoma-microsatellite stable, tumor mutation burden 3, K-ras wild type, NRAS wild-type, BRAF [id number] MRI abdomen 08/13/2020-ablation defects in segment 6 and segment 3, tiny foci of restricted diffusion with arterial phase enhancement at the junctions of segment 4A and segment 4B, and segment 5 concerning for metastatic disease CTs 09/09/2020-stable ablation sites at segment 6 and segment 2.  Small focus of hyperenhancement measuring 7 mm in the inferior right liver segment 6 anterior to the ablation site, and in the anterior right liver segment 5/8-unchanged compared to MRI and  concerning for metastatic disease, no other evidence of metastatic disease. Scattered irregular groundglass opacities in the posterior right upper lobe Cycle 1 FOLFIRI/Avastin 09/23/2020 Treatment held 10/08/2020 due to neutropenia Cycle 2 FOLFIRI/Avastin 10/14/2020, Udenyca Cycle 3 FOLFIRI/Avastin 10/30/2020, Udenyca Cycle 4 FOLFIRI/Avastin 11/14/2020, Udenyca Cycle 5 FOLFIRI/Avastin 11/28/2020, Udenyca CTs 12/11/2020-no evidence of metastatic disease in the chest, stable liver ablation sites, stable hypervascular lesion in segment 4A, previously described enhancing lesion in the right hepatic lobe not well appreciated, no new lesions\ Real-time ultrasound evaluation by Dr. Archer Asa 12/18/2020-no lesions are visible sonographically, therefore not a candidate for ablation Upper quadrant ultrasound 02/25/2021-no new or progressive hepatic metastases, prior ablation defects noted CTs 06/17/2021-unchanged 1.5 cm mixed attenuation left hepatic lesion, stable heterogenous lesion in the posterior right liver, no evidence of new metastatic disease-for review revealed 1.7 cm nodular lesion at the superior margin of the ablation site in the right liver concerning for recurrent disease CEA mildly elevated 06/17/2021 Ultrasound-guided biopsy of lesion followed by ablation encompassing the entirety of the tumor in the right liver, segment 6,, repeat CT revealed adequate ablation of the region of tumor, 07/30/2021, biopsy adenocarcinoma consistent with metastatic rectal adenocarcinoma CEA normal 08/08/2021 CT renal stone study 08/21/2021-microwave ablation defect in the right liver CT renal stone study 08/24/2021-decrease size of right liver hypodensity at site of prior microwave ablation CTs 10/13/2021-stable appearance of ablation defect in the liver, no evidence of metastatic disease MRI abdomen 01/12/2022-previously ablated liver lesions without enhancement, 3 stable subcentimeter foci of enhancement-likely  hemangiomas MRI abdomen 04/10/2022-stable appearance of previously ablated lesions in segment 3 and 6, no sign of progressive disease MRI abdomen 07/11/2022-stable appearance of previously treated segment 6 and 3 hepatic lesions, no evidence of recurrent disease, no new lesion CTs 10/27/2022-decrease size of treated hepatic lesions, no evidence for residual disease, no evidence of progressive metastatic disease stable multinodular enlargement of the thyroid History of Tobacco use Right UPJ stone on CT 05/24/2018 CT 11/21/2018-migration of previously noted right UPJ stone, new marked right hydroureteronephrosis Placement of a right JJ stent 11/21/2018 Laser lithotripsy and replacement of right JJ stent 12/12/2018 Followed by Dr. Ronne Binning, urology Migraine headaches Restless legs Arm pain during the oxaliplatin infusion and for 1 week after cycle 1.  The oxaliplatin will be further diluted and infusion time extended getting with cycle 2. Neutropenia secondary to chemotherapy.  Oxaliplatin dose reduced beginning with cycle 2. 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 Ronne Binning, surgical reimplantation of the right ureter on 08/03/2019, right ureter stent removed 09/14/2019 Thyroid nodules-new versus enlarged left thyroid nodule CT 05/29/2019, thyroid ultrasound 06/20/2019-multiple nodules, 1 right-sided nodule met criteria for ultrasound follow-up Ultrasound 05/08/2020-multinodular goiter, stable nodules in the isthmus and right thyroid meeting criteria for 1 year follow-up, additional stable nodules do not meet criteria for follow-up 12.  Right ureter injury following Foley catheter placement 07/30/2021-pyelogram confirmed extravasation of urine, placement of right ureter stent and Foley catheter 07/31/2021 Right ureter stent removed 09/04/2021 13.   Admission with urosepsis 08/24/2021    Disposition: Ms. Kouns is in clinical remission from rectal cancer.  We will follow-up on the result from the MRI liver from earlier this week.  She will return for an  office visit and CEA in 3 months.  We will discuss the timing of repeat CTs and an MRI when she is here in 3 months.  The upper back discomfort is likely related to a benign musculoskeletal condition.  She request a refill on hydrocodone.  She takes hydrocodone when she has a severe migraine headache.  She does not have a primary provider to order the hydrocodone.  I gave her a one-time refill for hydrocodone today.  Thornton Papas, MD  01/29/2023  12:19 PM

## 2023-02-09 ENCOUNTER — Ambulatory Visit
Admission: RE | Admit: 2023-02-09 | Discharge: 2023-02-09 | Disposition: A | Discharge status: Home / Self Care | Payer: Medicaid - Out of State | Source: Ambulatory Visit | Attending: Interventional Radiology | Admitting: Interventional Radiology

## 2023-02-09 DIAGNOSIS — C189 Malignant neoplasm of colon, unspecified: Secondary | ICD-10-CM

## 2023-02-09 DIAGNOSIS — C787 Secondary malignant neoplasm of liver and intrahepatic bile duct: Secondary | ICD-10-CM

## 2023-02-09 HISTORY — PX: IR RADIOLOGIST EVAL & MGMT: IMG5224

## 2023-02-09 NOTE — Progress Notes (Signed)
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    Referring Physician(s): Mancel Bale, MD   History of Present Illness: Norma Colon is a 62 y.o. female who initially presented at the kind request of Dr. 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  Norma Colon was initially diagnosed with a partially obstructing rectal mass in August 2019.  She underwent neoadjuvant combined radiation and chemotherapy (Xeloda) from September through October 2019 followed by a low anterior resection in December 2019.     Subsequently, she underwent 4 cycles of additional adjuvant chemotherapy with CAPOX completed in March 2020.  Subsequent surveillance imaging initially detected no evidence of metastatic disease.  Unfortunately, CT imaging from July 2021 demonstrated a subtle lesion in hepatic segment 6 concerning for metastatic disease.  A follow-up MRI dated 05/08/2020 confirms a 2.1 cm solid lesion in hepatic segment 6 as well as a concerning for millimeter lesion in the posterior aspect of segment 3.  Both lesions are concerning for metastatic disease.   She underwent concurrent ultrasound-guided biopsy and microwave ablation of the 2 liver lesions on 06/19/2020.     Follow-up MRI imaging from 08/13/20 showed an excellent treatment effect at the sites of the 2 ablated lesions, but 2 new lesions highly concerning for additional metastatic foci.  Norma Colon therefore went back for additional chemotherapy and has now completed another cycle of FOLFIRI/Avastin on 11/28/2020.  She reported increased malaise following this cycle of chemotherapy.  She had diarrhea and mild nosebleeding.   Restaging CT scans of the chest, abdomen and pelvis dated 12/12/2020 demonstrates no evidence of new metastatic disease or disease outside the liver.  The previously  identified tiny hypervascular lesions in segment 4A and segment 6 are less conspicuous and perhaps slightly smaller consistent with interval treatment response.     US imaging performed by me on  12/18/20 and again on 02/25/21 showed no visible hepatic lesions.    Re-staging CT scans 06/17/21 - New 1.7 cm lesion adjacent to the posterosuperior margin of the segment 6 ablation defect concerning for recurrent disease.    She underwent CT-guided biopsy and repeat microwave ablation of the recurrent tumor on 07/30/2021.  Unfortunately, at that time she sustained an inadvertent injury to the right ureter presumably due to unfortunate Foley catheter placement with in the reimplanted ureter.  Fortunately, we were able to diagnose this within the first day and urology was able to traverse the injury and place a double-J ureteral stent.  She also developed acute kidney injury with elevated creatinine due to to peritoneal reabsorption of the leaking urine.  CT CAP 10/13/2021: Post ablation defects in the liver without evidence of progressive disease or new metastatic disease in the chest, abdomen or pelvis.   MRI Abd 01/12/22: No new suspicious mass or lymphadenopathy identified in the abdomen. Previously ablated liver lesions without suspicious enhancement.   MRI Abd 04/10/22: Stable appearance of previously ablated lesions in segment 3 and 6 of the liver, as above. No signs of local recurrence of disease, and no new metastatic lesions noted in the liver on today's examination.   MRI Abd 07/13/22: Stable size/appearance of the previously treated segment VI and III hepatic lesions, without convincing evidence of recurrent disease. No new suspicious hepatic lesion identified.  MRI Abd 01/29/23:  Stable appearance of the ablation defects in the posterior right hepatic lobe (segment  VI) and lateral segment left liver (segment III). No evidence for local recurrence. No new suspicious lesion in the liver on today's study.    Overall, Norma Colon is doing well.    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[facility]) oncologist-- dr Truett Perna--  per last note in epic ,  clinical remission   dx 08/ 2019---- chemoradiation concurrent completed 07-20-2018;  s/p  low anterior resection 09-16-2018;   chemo 10-17-2018  ot 12-19-2018   Anxiety    Arthritis    Chemotherapy induced neutropenia (HCC)    Depression    Headache    migraines    History of HPV infection    History of kidney stones    Hydronephrosis, right    Restless leg syndrome    Scoliosis    Sepsis (HCC)    11-2018    Past Surgical History:  Procedure Laterality Date   COLONOSCOPY     COLPOSCOPY  04/04/2018   CYSTOSCOPY W/ RETROGRADES Right 07/31/2021   Procedure: CYSTOSCOPY WITH RETROGRADE PYELOGRAM, URETEROSCOPY AND STENT PLACEMENT;  Surgeon: Jannifer Hick, MD;  Location: [facility] OR;  Service: Urology;  Laterality: Right;   CYSTOSCOPY W/ URETERAL STENT PLACEMENT Right 09/04/2021   Procedure: CYSTOSCOPY WITH RETROGRADE PYELOGRAM WITH STENT REMOVAL;  Surgeon: Malen Gauze, MD;  Location: [facility];  Service: Urology;  Laterality: Right;   CYSTOSCOPY WITH RETROGRADE PYELOGRAM, URETEROSCOPY AND STENT PLACEMENT Right 12/12/2018   Procedure: CYSTOSCOPY WITH RETROGRADE PYELOGRAM, URETEROSCOPY, STONE EXTRACTION AND STENT PLACEMENT;  Surgeon: Malen Gauze, MD;  Location: [facility];  Service: Urology;  Laterality: Right;   CYSTOSCOPY WITH RETROGRADE PYELOGRAM, URETEROSCOPY AND STENT PLACEMENT Right 04/17/2019   Procedure: CYSTOSCOPY WITH RETROGRADE PYELOGRAM, BALLOON DILITATION,DIAGNOSTIC  URETEROSCOPY AND STENT PLACEMENT;  Surgeon: Malen Gauze, MD;  Location: [facility];  Service: Urology;  Laterality: Right;   CYSTOSCOPY WITH STENT PLACEMENT Right 11/21/2018   Procedure: CYSTOSCOPY, RIGHT RETROGRADE PYELOGRAM, WITH RIGHT URETERAL STENT PLACEMENT;  Surgeon: Malen Gauze, MD;   Location: [facility];  Service: Urology;  Laterality: Right;   IR IMAGING GUIDED PORT INSERTION  09/04/2020   IR NEPHROSTOMY PLACEMENT RIGHT  06/22/2019   IR PATIENT EVAL TECH 0-60 MINS  06/28/2019   IR RADIOLOGIST EVAL & MGMT  06/06/2020   IR RADIOLOGIST EVAL & MGMT  07/17/2020   IR RADIOLOGIST EVAL & MGMT  08/15/2020   IR RADIOLOGIST EVAL & MGMT  09/03/2020   IR RADIOLOGIST EVAL & MGMT  12/18/2020   IR RADIOLOGIST EVAL & MGMT  02/25/2021   IR RADIOLOGIST EVAL & MGMT  06/23/2021   IR RADIOLOGIST EVAL & MGMT  08/12/2021   IR RADIOLOGIST EVAL & MGMT  10/15/2021   IR RADIOLOGIST EVAL & MGMT  01/13/2022   IR RADIOLOGIST EVAL & MGMT  04/14/2022   IR RADIOLOGIST EVAL & MGMT  07/23/2022   IR RADIOLOGIST EVAL & MGMT  02/09/2023   IR REMOVAL TUN ACCESS W/ PORT W/O FL MOD SED  01/29/2022   left forearm surgery due to calcium rock      RADIOLOGY WITH ANESTHESIA N/A 06/19/2020   Procedure: CT WITH ANESTHESIA MICROWAVE ABLATION;  Surgeon: Malachy Moan, MD;  Location: [facility];  Service: Anesthesiology;  Laterality: N/A;   RADIOLOGY WITH ANESTHESIA N/A 07/30/2021   Procedure: CT MICROWAVE ABLATION OF THE LIVER;  Surgeon: Sterling Big, MD;  Location: [facility];  Service: Radiology;  Laterality: N/A;   ROBOT ASSISTED PYELOPLASTY Right 08/03/2019  Procedure: XI ROBOTIC ASSISTED PYELOPLASTY WITH STENT PLACEMENT;  Surgeon: Malen Gauze, MD;  Location: [facility];  Service: Urology;  Laterality: Right;  3 HRS   SHOULDER ARTHROSCOPY W/ ROTATOR CUFF REPAIR Right    XI ROBOTIC ASSISTED LOWER ANTERIOR RESECTION N/A 09/16/2018   Procedure: XI ROBOTIC ASSISTED LOWER ANTERIOR RESECTION ERAS PATHWAY;  Surgeon: Romie Levee, MD;  Location: [facility];  Service: General;  Laterality: N/A;    Allergies: Prednisone and Codeine  Medications: Prior to Admission medications   Medication Sig Start Date End Date Taking? Authorizing Provider  alprazolam Prudy Feeler) 2 MG tablet Take 2 mg by mouth every morning.      [provider]  buPROPion (WELLBUTRIN XL) 300 MG 24 hr tablet Take 300 mg by mouth every morning.     [provider]  cyclobenzaprine (FLEXERIL) 10 MG tablet Take 20 mg by mouth at bedtime.    [provider]  gabapentin (NEURONTIN) 300 MG capsule Take 1 capsule (300 mg total) by mouth 2 (two) times daily. 11/02/22   Ladene Artist, MD  HYDROcodone-acetaminophen (NORCO) 10-325 MG tablet Take 1 tablet by mouth every 6 (six) hours as needed for moderate pain or severe pain (severe migraine headache). Post-Operatively. 01/29/23   Ladene Artist, MD  NON FORMULARY Pt takes "15 day cleanse - gut support" from [employer]. Taking 2 daily    [provider]  rOPINIRole (REQUIP) 1 MG tablet Take 1 mg by mouth at bedtime.    [provider]     Family History  Problem Relation Age of Onset   COPD Father        smoker   Stomach cancer Father        spread to lungs and bones   Arthritis Mother    Heart attack Paternal Grandmother    Heart attack Paternal Grandfather    Heart attack Maternal Grandmother    Stroke Maternal Grandmother    Colon cancer Neg Hx    Rectal cancer Neg Hx    Esophageal cancer Neg Hx     Social History   Socioeconomic History   Marital status: Divorced    Spouse name: Not on file   Number of children: 1   Years of education: Not on file   Highest education level: Not on file  Occupational History   Occupation: Loss adjuster, chartered  Tobacco Use   Smoking status: Former    Packs/day: 1.00    Years: 30.00    Additional pack years: 0.00    Total pack years: 30.00    Types: Cigarettes    Quit date: 09/16/2018    Years since quitting: 4.4   Smokeless tobacco: Never  Vaping Use   Vaping Use: Never used  Substance and Sexual Activity   Alcohol use: Yes    Comment: social   Drug use: Never   Sexual activity: Yes  Other Topics Concern   Not on file  Social History Narrative   Not on file   Social Determinants  of Health   Financial Resource Strain: Not on file  Food Insecurity: Not on file  Transportation Needs: Not on file  Physical Activity: Not on file  Stress: Not on file  Social Connections: Not on file    ECOG Status: 0 - Asymptomatic  Review of Systems  Review of Systems: A 12 point ROS discussed and pertinent positives are indicated in the HPI above.  All other systems are negative.    Physical  Exam No direct physical exam was performed (except for noted visual exam findings with Video Visits).   Vital Signs: LMP  (LMP Unknown)   Imaging: IR Radiologist Eval & Mgmt  Result Date: 02/09/2023 EXAM: ESTABLISHED PATIENT OFFICE VISIT CHIEF COMPLAINT: SEE EPIC NOTE HISTORY OF PRESENT ILLNESS: SEE EPIC NOTE REVIEW OF SYSTEMS: SEE EPIC NOTE PHYSICAL EXAMINATION: SEE EPIC NOTE ASSESSMENT AND PLAN: SEE EPIC NOTE Electronically Signed   By: Malachy Moan M.D.   On: 02/09/2023 15:34   MR Abdomen W Wo Contrast  Result Date: 01/29/2023 CLINICAL DATA:  Metastatic colon cancer.  Liver lesions. EXAM: MRI ABDOMEN WITHOUT AND WITH CONTRAST TECHNIQUE: Multiplanar multisequence MR imaging of the abdomen was performed both before and after the administration of intravenous contrast. CONTRAST:  10 cc Vueway COMPARISON:  CT scan 10/27/2022.  Abdominal MRI 07/11/2022. FINDINGS: Lower chest: Unremarkable. Hepatobiliary: Posterior right hepatic lobe lesion identified previously is 3.1 x 2.6 cm today on postcontrast image 35 of series 15, compared to 2.9 x 2.6 cm on previous CT and 3.2 x 2.5 cm on previous MRI. Lesion has heterogeneous signal intensity, similar to prior MRI without evidence for enhancement. Lesion remains compatible with ablation defect. 12 mm lateral segment left liver lesion seen on 33/10 was 12 mm on previous CT and 13 mm on previous MRI of 07/11/2022. This lesion also underwent ablation on 06/19/2020. No evidence for local recurrence. No new suspicious lesion in the liver on today's  study. Gallstones again noted. Common bile duct measures up to 6 mm diameter, upper normal. Pancreas: No focal mass lesion. No dilatation of the main duct. No intraparenchymal cyst. No peripancreatic edema. Spleen:  No splenomegaly. No focal mass lesion. Adrenals/Urinary Tract: No adrenal nodule or mass. Kidneys unremarkable. Stomach/Bowel: Stomach is unremarkable. No gastric wall thickening. No evidence of outlet obstruction. Duodenum is normally positioned as is the ligament of Treitz. No small bowel or colonic dilatation within the visualized abdomen. Vascular/Lymphatic: No abdominal aortic aneurysm. No abdominal lymphadenopathy. Other:  No intraperitoneal free fluid. Musculoskeletal: No focal suspicious marrow enhancement within the visualized bony anatomy. IMPRESSION: 1. Stable appearance of the ablation defects in the posterior right hepatic lobe (segment VI) and lateral segment left liver (segment III). No evidence for local recurrence. No new suspicious lesion in the liver on today's study. 2. Cholelithiasis. ( Electronically Signed   By: Kennith Center M.D.   On: 01/29/2023 12:40    Labs:  CBC: No results for input(s): "WBC", "HGB", "HCT", "PLT" in the last 8760 hours.  COAGS: No results for input(s): "INR", "APTT" in the last 8760 hours.  BMP: Recent Labs    10/27/22 1449 11/02/22 1024  NA 138 138  K 4.1 3.8  CL 103 104  CO2 27 25  GLUCOSE 102* 154*  BUN 19 23*  CALCIUM 10.6* 9.6  8.9  CREATININE 1.12* 1.06*  GFRNONAA 57* >60    LIVER FUNCTION TESTS: Recent Labs    11/02/22 1024  BILITOT 0.6  AST 20  ALT 38  ALKPHOS 107  PROT 7.6  ALBUMIN 4.3    TUMOR MARKERS: Recent Labs    04/30/22 1055 07/30/22 1449 10/27/22 1449 01/29/23 1139  CEA 1.53 1.43 1.84 1.49    Assessment and Plan:  62 year old female with colon cancer metastatic to the liver status post percutaneous microwave ablation x2.  She is now doing well at 18 months post procedure with no evidence of  residual or recurrent disease.  We will continue routine imaging surveillance. Repeat MRI  in 6 months time.   1.) Repeat liver MRI with contrast in September 2024 with clinic visit.   Electronically Signed: Sterling Big 02/09/2023, 3:41 PM   I spent a total of  10 Minutes in remote  clinical consultation, greater than 50% of which was counseling/coordinating care for colon cancer mets to liver.    Visit type: Audio only (telephone). Audio (no video) only due to patient preference. Alternative for in-person consultation at [facility], [address] [place], [place], [place]. This visit type was conducted due to national recommendations for restrictions regarding the COVID-19 Pandemic (e.g. social distancing).  This format is felt to be most appropriate for this patient at this time.  All issues noted in this document were discussed and addressed.

## 2023-03-03 ENCOUNTER — Other Ambulatory Visit (HOSPITAL_COMMUNITY): Payer: Self-pay

## 2023-03-04 ENCOUNTER — Telehealth: Payer: Self-pay | Admitting: Oncology

## 2023-04-02 ENCOUNTER — Other Ambulatory Visit: Payer: Self-pay | Admitting: Nurse Practitioner

## 2023-04-02 ENCOUNTER — Other Ambulatory Visit (HOSPITAL_BASED_OUTPATIENT_CLINIC_OR_DEPARTMENT_OTHER): Payer: Self-pay

## 2023-04-02 ENCOUNTER — Other Ambulatory Visit: Payer: Self-pay | Admitting: Oncology

## 2023-04-05 ENCOUNTER — Other Ambulatory Visit: Payer: Self-pay | Admitting: Nurse Practitioner

## 2023-04-05 ENCOUNTER — Telehealth: Payer: Self-pay | Admitting: *Deleted

## 2023-04-05 NOTE — Telephone Encounter (Signed)
Pt sent request for Norco rx for fill to Dr.Sherrill, per office note, fill was to be done once and advised to have GYN/PCP to fill. Pt was called and advised to have refill done by GYN/PCP due to no longer having infusions since 07/2022. Pt verbalized understanding.

## 2023-04-17 ENCOUNTER — Other Ambulatory Visit (HOSPITAL_BASED_OUTPATIENT_CLINIC_OR_DEPARTMENT_OTHER): Payer: Self-pay

## 2023-04-17 ENCOUNTER — Other Ambulatory Visit: Payer: Self-pay | Admitting: Oncology

## 2023-04-26 ENCOUNTER — Ambulatory Visit: Payer: Commercial Managed Care - PPO | Admitting: Oncology

## 2023-04-26 ENCOUNTER — Other Ambulatory Visit: Payer: Commercial Managed Care - PPO

## 2023-04-28 ENCOUNTER — Other Ambulatory Visit (HOSPITAL_BASED_OUTPATIENT_CLINIC_OR_DEPARTMENT_OTHER): Payer: Self-pay

## 2023-04-30 ENCOUNTER — Inpatient Hospital Stay: Payer: Commercial Managed Care - PPO | Admitting: Nurse Practitioner

## 2023-04-30 ENCOUNTER — Inpatient Hospital Stay: Payer: Commercial Managed Care - PPO

## 2023-05-03 ENCOUNTER — Inpatient Hospital Stay: Payer: Commercial Managed Care - PPO | Attending: Oncology

## 2023-05-03 ENCOUNTER — Inpatient Hospital Stay (HOSPITAL_BASED_OUTPATIENT_CLINIC_OR_DEPARTMENT_OTHER): Payer: Commercial Managed Care - PPO | Admitting: Oncology

## 2023-05-03 VITALS — BP 126/75 | HR 99 | Temp 98.1°F | Resp 18 | Ht 68.0 in | Wt 211.4 lb

## 2023-05-03 DIAGNOSIS — R109 Unspecified abdominal pain: Secondary | ICD-10-CM | POA: Insufficient documentation

## 2023-05-03 DIAGNOSIS — Z87891 Personal history of nicotine dependence: Secondary | ICD-10-CM | POA: Diagnosis not present

## 2023-05-03 DIAGNOSIS — E042 Nontoxic multinodular goiter: Secondary | ICD-10-CM | POA: Diagnosis not present

## 2023-05-03 DIAGNOSIS — G43909 Migraine, unspecified, not intractable, without status migrainosus: Secondary | ICD-10-CM | POA: Insufficient documentation

## 2023-05-03 DIAGNOSIS — G2581 Restless legs syndrome: Secondary | ICD-10-CM | POA: Insufficient documentation

## 2023-05-03 DIAGNOSIS — C787 Secondary malignant neoplasm of liver and intrahepatic bile duct: Secondary | ICD-10-CM | POA: Diagnosis not present

## 2023-05-03 DIAGNOSIS — K5900 Constipation, unspecified: Secondary | ICD-10-CM | POA: Diagnosis not present

## 2023-05-03 DIAGNOSIS — C20 Malignant neoplasm of rectum: Secondary | ICD-10-CM | POA: Diagnosis not present

## 2023-05-03 DIAGNOSIS — Z85048 Personal history of other malignant neoplasm of rectum, rectosigmoid junction, and anus: Secondary | ICD-10-CM | POA: Insufficient documentation

## 2023-05-03 LAB — CMP (CANCER CENTER ONLY)
ALT: 26 U/L (ref 0–44)
AST: 21 U/L (ref 15–41)
Albumin: 4.3 g/dL (ref 3.5–5.0)
Alkaline Phosphatase: 81 U/L (ref 38–126)
Anion gap: 9 (ref 5–15)
BUN: 24 mg/dL — ABNORMAL HIGH (ref 6–20)
CO2: 29 mmol/L (ref 22–32)
Calcium: 9.9 mg/dL (ref 8.9–10.3)
Chloride: 104 mmol/L (ref 98–111)
Creatinine: 1.07 mg/dL — ABNORMAL HIGH (ref 0.44–1.00)
GFR, Estimated: 60 mL/min — ABNORMAL LOW (ref >60)
Glucose, Bld: 82 mg/dL (ref 70–99)
Potassium: 3.7 mmol/L (ref 3.5–5.1)
Sodium: 142 mmol/L (ref 135–145)
Total Bilirubin: 0.7 mg/dL (ref 0.3–1.2)
Total Protein: 7.6 g/dL (ref 6.5–8.1)

## 2023-05-03 LAB — CEA (ACCESS): CEA (CHCC): 1.23 ng/mL (ref 0.00–5.00)

## 2023-05-03 NOTE — Progress Notes (Signed)
[facility] OFFICE PROGRESS NOTE   Diagnosis: Rectal cancer  INTERVAL HISTORY:   Norma Colon returns as scheduled.  She continues to have constipation.  An over-the-counter preparation helped.  She has persistent right flank pain.  She has intermittent discomfort at the right costal margin. She continues to have intermittent migraine headaches. Objective:  Vital signs in last 24 hours:  Blood pressure 126/75, pulse 99, temperature 98.1 F (36.7 C), temperature source Oral, resp. rate 18, height 5\' 8"  (1.727 m), weight 211 lb 6.4 oz (95.9 kg), SpO2 98%.    Lymphatics: No cervical, supraclavicular, axillary, or inguinal nodes Resp: Lungs clear bilaterally Cardio: Regular rate and rhythm GI: No hepatosplenomegaly, nontender, no mass Vascular: No leg edema Musculoskeletal: Tender at the right flank, no tenderness at the right lower anterior chest wall  Lab Results:  Lab Results  Component Value Date   WBC 6.5 11/05/2021   HGB 13.7 11/05/2021   HCT 41.8 11/05/2021   MCV 93.1 11/05/2021   PLT 270.0 11/05/2021   NEUTROABS 2.6 08/26/2021    CMP  Lab Results  Component Value Date   NA 142 05/03/2023   K 3.7 05/03/2023   CL 104 05/03/2023   CO2 29 05/03/2023   GLUCOSE 82 05/03/2023   BUN 24 (H) 05/03/2023   CREATININE 1.07 (H) 05/03/2023   CALCIUM 9.9 05/03/2023   PROT 7.6 05/03/2023   ALBUMIN 4.3 05/03/2023   AST 21 05/03/2023   ALT 26 05/03/2023   ALKPHOS 81 05/03/2023   BILITOT 0.7 05/03/2023   GFRNONAA 60 (L) 05/03/2023   GFRAA >60 06/17/2020    Lab Results  Component Value Date   CEA1 6.85 (H) 06/17/2021   CEA 1.23 05/03/2023    Lab Results  Component Value Date   INR 1.1 07/15/2021   LABPROT 14.5 07/15/2021    Imaging:  No results found.  Medications: I have reviewed the patient's current medications.   Assessment/Plan: Rectal cancer Colonoscopy 05/17/2018 revealed a partially obstructing mass beginning at 10 cm from the anal  verge, biopsy confirmed invasive moderately differentiated adenocarcinoma CTs 05/24/2018-mass beginning at proximal he 7.6 cm from the anal verge, "20 "perirectal lymph nodes, no evidence of metastatic disease Pelvic MRI 06/01/2018- tumor measured at 10.8 cm from the anal sphincter, T3b, N1-2 left perirectal lymph nodes, each measuring 5 mm Radiation and Xeloda initiated 06/13/2018, completed 07/20/2018 Low anterior resection 09/16/2018,ypT3,ypN1c, 1 cm tumor, partial response-score 2, 0/13 lymph nodes positive, 2 tumor deposits, MSI-stable, no loss of mismatch repair protein expression Cycle 1 CAPOX 10/17/2018 Cycle 2 CAPOX 11/07/2018 (oxaliplatin dose reduced due to neutropenia) Cycle 3 CAPOX 11/28/2018 Cycle 4 CAPOX 12/19/2018 CTs 05/29/2019-no evidence of metastatic disease, severe right hydronephrosis, left thyroid nodule CTs 04/11/2020-subtle nodular lesion in the inferior right liver suspicious for a new metastasis, status post right ureteral resection and psoas hitch/flap, resolution of hydronephrosis, sacral insufficiency fractures bilaterally MRI liver 05/08/2020-2.1 cm segment 6 lesion suspicious for a metastasis, 4 mm suspicious segment 3 lesion 4 mm segment 4B lesion consistent with a hemangioma, no abdominal lymphadenopathy, cholelithiasis Ablation of segment 6 and segment 3 lesions 06/19/2020, pathology from segment 6 biopsy confirmed metastatic adenocarcinoma-microsatellite stable, tumor mutation burden 3, K-ras wild type, NRAS wild-type, BRAF [id number] MRI abdomen 08/13/2020-ablation defects in segment 6 and segment 3, tiny foci of restricted diffusion with arterial phase enhancement at the junctions of segment 4A and segment 4B, and segment 5 concerning for metastatic disease CTs 09/09/2020-stable ablation sites at segment 6 and segment 2.  Small focus of hyperenhancement measuring 7 mm in the inferior right liver segment 6 anterior to the ablation site, and in the anterior right liver segment  5/8-unchanged compared to MRI and concerning for metastatic disease, no other evidence of metastatic disease. Scattered irregular groundglass opacities in the posterior right upper lobe Cycle 1 FOLFIRI/Avastin 09/23/2020 Treatment held 10/08/2020 due to neutropenia Cycle 2 FOLFIRI/Avastin 10/14/2020, Udenyca Cycle 3 FOLFIRI/Avastin 10/30/2020, Udenyca Cycle 4 FOLFIRI/Avastin 11/14/2020, Udenyca Cycle 5 FOLFIRI/Avastin 11/28/2020, Udenyca CTs 12/11/2020-no evidence of metastatic disease in the chest, stable liver ablation sites, stable hypervascular lesion in segment 4A, previously described enhancing lesion in the right hepatic lobe not well appreciated, no new lesions\ Real-time ultrasound evaluation by Dr. Archer Asa 12/18/2020-no lesions are visible sonographically, therefore not a candidate for ablation Upper quadrant ultrasound 02/25/2021-no new or progressive hepatic metastases, prior ablation defects noted CTs 06/17/2021-unchanged 1.5 cm mixed attenuation left hepatic lesion, stable heterogenous lesion in the posterior right liver, no evidence of new metastatic disease-for review revealed 1.7 cm nodular lesion at the superior margin of the ablation site in the right liver concerning for recurrent disease CEA mildly elevated 06/17/2021 Ultrasound-guided biopsy of lesion followed by ablation encompassing the entirety of the tumor in the right liver, segment 6,, repeat CT revealed adequate ablation of the region of tumor, 07/30/2021, biopsy adenocarcinoma consistent with metastatic rectal adenocarcinoma CEA normal 08/08/2021 CT renal stone study 08/21/2021-microwave ablation defect in the right liver CT renal stone study 08/24/2021-decrease size of right liver hypodensity at site of prior microwave ablation CTs 10/13/2021-stable appearance of ablation defect in the liver, no evidence of metastatic disease MRI abdomen 01/12/2022-previously ablated liver lesions without enhancement, 3 stable subcentimeter foci of  enhancement-likely hemangiomas MRI abdomen 04/10/2022-stable appearance of previously ablated lesions in segment 3 and 6, no sign of progressive disease MRI abdomen 07/11/2022-stable appearance of previously treated segment 6 and 3 hepatic lesions, no evidence of recurrent disease, no new lesion CTs 10/27/2022-decrease size of treated hepatic lesions, no evidence for residual disease, no evidence of progressive metastatic disease stable multinodular enlargement of the thyroid MRI abdomen 01/27/2023-stable ablation defect in the posterior right liver segment 6 and lateral left liver segment 3, no new lesion, no evidence of local recurrence History of Tobacco use Right UPJ stone on CT 05/24/2018 CT 11/21/2018-migration of previously noted right UPJ stone, new marked right hydroureteronephrosis Placement of a right JJ stent 11/21/2018 Laser lithotripsy and replacement of right JJ stent 12/12/2018 Followed by Dr. Ronne Binning, urology Migraine headaches Restless legs Arm pain during the oxaliplatin infusion and for 1 week after cycle 1.  The oxaliplatin will be further diluted and infusion time extended getting with cycle 2. Neutropenia secondary to chemotherapy.  Oxaliplatin dose reduced beginning with cycle 2. 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 Ronne Binning, surgical reimplantation of the right ureter on 08/03/2019, right ureter stent removed 09/14/2019 Thyroid nodules-new versus enlarged left thyroid nodule CT 05/29/2019, thyroid ultrasound 06/20/2019-multiple nodules, 1 right-sided nodule met criteria for ultrasound follow-up Ultrasound 05/08/2020-multinodular goiter, stable nodules in the isthmus and right thyroid meeting criteria for 1 year follow-up, additional stable nodules do not meet criteria for follow-up 12.  Right ureter injury following  Foley catheter placement 07/30/2021-pyelogram confirmed extravasation of urine, placement of right ureter stent and Foley catheter 07/31/2021 Right ureter stent removed 09/04/2021 13.  Admission with urosepsis 08/24/2021     Disposition: Norma Colon is  in clinical remission from rectal cancer.  She will be scheduled for surveillance CTs and a CEA in January 2025.  She will see Dr. Archer Asa for a surveillance liver MRI in September 2024.  Thornton Papas, MD  05/03/2023  12:55 PM

## 2023-05-05 ENCOUNTER — Telehealth: Payer: Self-pay | Admitting: *Deleted

## 2023-05-05 NOTE — Telephone Encounter (Signed)
-----   Message from Thornton Papas sent at 05/03/2023  8:03 PM EDT ----- Please call patient, Dr. Archer Asa recommended a 52-month follow-up liver MRI at his last visit, it does not look like this has been scheduled, see if she would agree to proceed with a repeat MRI

## 2023-05-05 NOTE — Telephone Encounter (Signed)
Informed Norma Colon that Dr. Hyman Bible visit note with her (telephone) recommends repeat MRI liver in 6 months (Sept). She is willing to proceed, but is working on getting health insurance right now and wants to defer this till she is insured again. She will call our office when she secures health insurance.

## 2023-05-10 ENCOUNTER — Telehealth: Payer: Self-pay | Admitting: *Deleted

## 2023-05-10 NOTE — Telephone Encounter (Signed)
Patient agrees to CT scan per Dr. Truett Perna and MRI liver for Dr. Archer Asa, but she will only do them on the same day. Also still does not have her insurance straightened out. She will call when this is taken care of to allow [place] to schedule the scans.

## 2023-05-18 ENCOUNTER — Other Ambulatory Visit (HOSPITAL_BASED_OUTPATIENT_CLINIC_OR_DEPARTMENT_OTHER): Payer: Self-pay

## 2023-06-20 ENCOUNTER — Other Ambulatory Visit (HOSPITAL_BASED_OUTPATIENT_CLINIC_OR_DEPARTMENT_OTHER): Payer: Self-pay

## 2023-08-03 ENCOUNTER — Telehealth: Payer: Self-pay | Admitting: *Deleted

## 2023-08-03 NOTE — Telephone Encounter (Signed)
Patient called to report she has just filed for social security disability and asking for office to send records. Informed her that SSD will send record requests to HIM and they will send to [facility]. Their physician will make decision after record review.

## 2023-09-20 ENCOUNTER — Telehealth: Payer: Self-pay | Admitting: *Deleted

## 2023-09-20 NOTE — Telephone Encounter (Signed)
Called patient with CT appointment on 12/27 w/prep. Will have labs at 2:45 prior to 3 pm arrival in radiology at [facility]

## 2023-09-24 ENCOUNTER — Other Ambulatory Visit (HOSPITAL_BASED_OUTPATIENT_CLINIC_OR_DEPARTMENT_OTHER): Payer: Commercial Managed Care - PPO

## 2023-09-27 ENCOUNTER — Telehealth: Payer: Self-pay | Admitting: *Deleted

## 2023-09-27 MED ORDER — PENICILLIN V POTASSIUM 500 MG PO TABS: 500.0000 mg | ORAL_TABLET | Freq: Four times a day (QID) | ORAL | 0 refills | Status: DC

## 2023-09-27 NOTE — Telephone Encounter (Signed)
Norma Colon called to report she is having tooth pain. Her dentist is out of the office until 10/04/23. In past he has prescribed Pen-VK 500 mg qid x 7 days. She is asking if Dr. Truett Perna would order this for her? Requests to send to [facility] on [address] Also will be bringing a form for MD to complete for her SSD.

## 2023-09-27 NOTE — Telephone Encounter (Signed)
Dr. Truett Perna agreed to order 1 week supply of the penicillin to pharmacy. Patient notified

## 2023-10-01 ENCOUNTER — Inpatient Hospital Stay: Payer: Medicaid Other | Attending: Oncology

## 2023-10-01 ENCOUNTER — Ambulatory Visit (HOSPITAL_BASED_OUTPATIENT_CLINIC_OR_DEPARTMENT_OTHER)
Admission: RE | Admit: 2023-10-01 | Discharge: 2023-10-01 | Disposition: A | Discharge status: Home / Self Care | Payer: Medicaid Other | Source: Ambulatory Visit | Attending: Oncology | Admitting: Oncology

## 2023-10-01 DIAGNOSIS — C20 Malignant neoplasm of rectum: Secondary | ICD-10-CM | POA: Insufficient documentation

## 2023-10-01 DIAGNOSIS — C787 Secondary malignant neoplasm of liver and intrahepatic bile duct: Secondary | ICD-10-CM | POA: Insufficient documentation

## 2023-10-01 DIAGNOSIS — Z85048 Personal history of other malignant neoplasm of rectum, rectosigmoid junction, and anus: Secondary | ICD-10-CM | POA: Diagnosis present

## 2023-10-01 LAB — CMP (CANCER CENTER ONLY)
ALT: 23 U/L (ref 0–44)
AST: 21 U/L (ref 15–41)
Albumin: 4.3 g/dL (ref 3.5–5.0)
Alkaline Phosphatase: 101 U/L (ref 38–126)
Anion gap: 8 (ref 5–15)
BUN: 22 mg/dL — ABNORMAL HIGH (ref 6–20)
CO2: 30 mmol/L (ref 22–32)
Calcium: 9.8 mg/dL (ref 8.9–10.3)
Chloride: 103 mmol/L (ref 98–111)
Creatinine: 1.07 mg/dL — ABNORMAL HIGH (ref 0.44–1.00)
GFR, Estimated: 59 mL/min — ABNORMAL LOW (ref >60)
Glucose, Bld: 89 mg/dL (ref 70–99)
Potassium: 4 mmol/L (ref 3.5–5.1)
Sodium: 141 mmol/L (ref 135–145)
Total Bilirubin: 0.6 mg/dL (ref <1.2)
Total Protein: 8.2 g/dL — ABNORMAL HIGH (ref 6.5–8.1)

## 2023-10-01 LAB — CEA (ACCESS): CEA (CHCC): 1.55 ng/mL (ref 0.00–5.00)

## 2023-10-01 MED ORDER — IOHEXOL 300 MG/ML  SOLN
80.0000 mL | Freq: Once | INTRAMUSCULAR | Status: AC | PRN
  Administered 2023-10-01: 80 mL via INTRAVENOUS

## 2023-10-11 ENCOUNTER — Telehealth: Payer: Self-pay

## 2023-10-11 ENCOUNTER — Other Ambulatory Visit: Payer: Self-pay | Admitting: Interventional Radiology

## 2023-10-11 ENCOUNTER — Telehealth: Payer: Self-pay | Admitting: *Deleted

## 2023-10-11 DIAGNOSIS — C189 Malignant neoplasm of colon, unspecified: Secondary | ICD-10-CM

## 2023-10-11 NOTE — Telephone Encounter (Signed)
-----   Message from Thornton Papas sent at 10/08/2023  4:14 PM EST ----- Please call patient CT show no evidence of recurrent cancer, follow-up as scheduled

## 2023-10-11 NOTE — Telephone Encounter (Signed)
 Patient gave verbal understanding and request the ct result to be forward to Dr. Archer Asa.

## 2023-10-11 NOTE — Telephone Encounter (Signed)
 Dr. Cloretta reviewed list of medical issues Norma Colon reports on paperwork she brought in paperwork reporting her medical issues of arthritis, migraines, dizziness with bending over, scoliosis, urinary incontinence, neuropathy. Informed her that none of these issues are related to her cancer. CT shows she is cancer free. Needs to find a disability physician to examine her and document.

## 2023-10-19 ENCOUNTER — Other Ambulatory Visit: Payer: Commercial Managed Care - PPO

## 2023-10-19 ENCOUNTER — Inpatient Hospital Stay: Payer: Medicaid Other

## 2023-10-21 ENCOUNTER — Ambulatory Visit
Admission: RE | Admit: 2023-10-21 | Discharge: 2023-10-21 | Disposition: A | Discharge status: Home / Self Care | Payer: Medicare (Managed Care) | Source: Ambulatory Visit | Attending: Interventional Radiology | Admitting: Interventional Radiology

## 2023-10-21 DIAGNOSIS — C189 Malignant neoplasm of colon, unspecified: Secondary | ICD-10-CM

## 2023-10-21 HISTORY — PX: IR RADIOLOGIST EVAL & MGMT: IMG5224

## 2023-10-21 NOTE — Progress Notes (Signed)
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    Referring Physician(s): Mancel Bale, MD   History of Present Illness: Norma Colon is a 63 y.o. female who initially presented at the kind request of Dr. 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  Norma Colon was initially diagnosed with a partially obstructing rectal mass in August 2019.  She underwent neoadjuvant combined radiation and chemotherapy (Xeloda) from September through October 2019 followed by a low anterior resection in December 2019.     Subsequently, she underwent 4 cycles of additional adjuvant chemotherapy with CAPOX completed in March 2020.  Subsequent surveillance imaging initially detected no evidence of metastatic disease.  Unfortunately, CT imaging from July 2021 demonstrated a subtle lesion in hepatic segment 6 concerning for metastatic disease.  A follow-up MRI dated 05/08/2020 confirms a 2.1 cm solid lesion in hepatic segment 6 as well as a concerning for millimeter lesion in the posterior aspect of segment 3.  Both lesions are concerning for metastatic disease.   She underwent concurrent ultrasound-guided biopsy and microwave ablation of the 2 liver lesions on 06/19/2020.     Follow-up MRI imaging from 08/13/20 showed an excellent treatment effect at the sites of the 2 ablated lesions, but 2 new lesions highly concerning for additional metastatic foci.  Norma Colon therefore went back for additional chemotherapy and has now completed another cycle of FOLFIRI/Avastin on 11/28/2020.  She reported increased malaise following this cycle of chemotherapy.  She had diarrhea and mild nosebleeding.   Restaging CT scans of the chest, abdomen and pelvis dated 12/12/2020 demonstrates no evidence of new metastatic disease or disease outside the liver.  The previously  identified tiny hypervascular lesions in segment 4A and segment 6 are less conspicuous and perhaps slightly smaller consistent with interval treatment response.     US imaging performed by me on  12/18/20 and again on 02/25/21 showed no visible hepatic lesions.    Re-staging CT scans 06/17/21 - New 1.7 cm lesion adjacent to the posterosuperior margin of the segment 6 ablation defect concerning for recurrent disease.    She underwent CT-guided biopsy and repeat microwave ablation of the recurrent tumor on 07/30/2021.  Unfortunately, at that time she sustained an inadvertent injury to the right ureter presumably due to unfortunate Foley catheter placement with in the reimplanted ureter.  Fortunately, we were able to diagnose this within the first day and urology was able to traverse the injury and place a double-J ureteral stent.  She also developed acute kidney injury with elevated creatinine due to to peritoneal reabsorption of the leaking urine.   CT CAP 10/13/2021: Post ablation defects in the liver without evidence of progressive disease or new metastatic disease in the chest, abdomen or pelvis.   MRI Abd 01/12/22: No new suspicious mass or lymphadenopathy identified in the abdomen. Previously ablated liver lesions without suspicious enhancement.   MRI Abd 04/10/22: Stable appearance of previously ablated lesions in segment 3 and 6 of the liver, as above. No signs of local recurrence of disease, and no new metastatic lesions noted in the liver on today's examination.   MRI Abd 07/13/22: Stable size/appearance of the previously treated segment VI and III hepatic lesions, without convincing evidence of recurrent disease. No new suspicious hepatic lesion identified.   MRI Abd 01/29/23:  Stable appearance of the ablation defects in the posterior right  hepatic lobe (segment VI) and lateral segment left liver (segment III). No evidence for local recurrence. No new suspicious lesion in the liver on today's  study.  CT CAP 10/01/23: Surgical changes along distal colonic resection and primary anastomosis. Stable ablation sites along the liver. No new mass lesion or clear signs of recurrence.   Overall, Norma Colon continues to do well.  She does complain of intermittent right hip pain and we reviewed her imaging together.  I reassured her that I see no evidence of metastatic disease to the right hip region.  She does have significant anterolisthesis of L5 on S1 with bilateral pars defects.  I suspect she may have some neural impingement.  She knows about her back and does not want to pursue any therapy at this time.   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[facility]) oncologist-- dr Truett Perna--  per last note in epic ,  clinical remission   dx 08/ 2019---- chemoradiation concurrent completed 07-20-2018;  s/p  low anterior resection 09-16-2018;   chemo 10-17-2018  ot 12-19-2018   Anxiety    Arthritis    Chemotherapy induced neutropenia (HCC)    Depression    Headache    migraines    History of HPV infection    History of kidney stones    Hydronephrosis, right    Restless leg syndrome    Scoliosis    Sepsis (HCC)    11-2018    Past Surgical History:  Procedure Laterality Date   COLONOSCOPY     COLPOSCOPY  04/04/2018   CYSTOSCOPY W/ RETROGRADES Right 07/31/2021   Procedure: CYSTOSCOPY WITH RETROGRADE PYELOGRAM, URETEROSCOPY AND STENT PLACEMENT;  Surgeon: Jannifer Hick, MD;  Location: [facility] OR;  Service: Urology;  Laterality: Right;   CYSTOSCOPY W/ URETERAL STENT PLACEMENT Right 09/04/2021   Procedure: CYSTOSCOPY WITH RETROGRADE PYELOGRAM WITH STENT REMOVAL;  Surgeon: Malen Gauze, MD;  Location: [facility];  Service: Urology;  Laterality: Right;   CYSTOSCOPY WITH RETROGRADE PYELOGRAM, URETEROSCOPY AND STENT PLACEMENT Right 12/12/2018   Procedure: CYSTOSCOPY WITH RETROGRADE PYELOGRAM, URETEROSCOPY, STONE EXTRACTION AND STENT PLACEMENT;  Surgeon: Malen Gauze, MD;   Location: [facility];  Service: Urology;  Laterality: Right;   CYSTOSCOPY WITH RETROGRADE PYELOGRAM, URETEROSCOPY AND STENT PLACEMENT Right 04/17/2019   Procedure: CYSTOSCOPY WITH RETROGRADE PYELOGRAM, BALLOON DILITATION,DIAGNOSTIC  URETEROSCOPY AND STENT PLACEMENT;  Surgeon: Malen Gauze, MD;  Location: [facility];  Service: Urology;  Laterality: Right;   CYSTOSCOPY WITH STENT PLACEMENT Right 11/21/2018   Procedure: CYSTOSCOPY, RIGHT RETROGRADE PYELOGRAM, WITH RIGHT URETERAL STENT PLACEMENT;  Surgeon: Malen Gauze, MD;  Location: [facility];  Service: Urology;  Laterality: Right;   IR IMAGING GUIDED PORT INSERTION  09/04/2020   IR NEPHROSTOMY PLACEMENT RIGHT  06/22/2019   IR PATIENT EVAL TECH 0-60 MINS  06/28/2019   IR RADIOLOGIST EVAL & MGMT  06/06/2020   IR RADIOLOGIST EVAL & MGMT  07/17/2020   IR RADIOLOGIST EVAL & MGMT  08/15/2020   IR RADIOLOGIST EVAL & MGMT  09/03/2020   IR RADIOLOGIST EVAL & MGMT  12/18/2020   IR RADIOLOGIST EVAL & MGMT  02/25/2021   IR RADIOLOGIST EVAL & MGMT  06/23/2021   IR RADIOLOGIST EVAL & MGMT  08/12/2021   IR RADIOLOGIST EVAL & MGMT  10/15/2021   IR RADIOLOGIST EVAL & MGMT  01/13/2022   IR RADIOLOGIST EVAL & MGMT  04/14/2022   IR RADIOLOGIST EVAL & MGMT  07/23/2022  IR RADIOLOGIST EVAL & MGMT  02/09/2023   IR RADIOLOGIST EVAL & MGMT  10/21/2023   IR REMOVAL TUN ACCESS W/ PORT W/O FL MOD SED  01/29/2022   left forearm surgery due to calcium rock      RADIOLOGY WITH ANESTHESIA N/A 06/19/2020   Procedure: CT WITH ANESTHESIA MICROWAVE ABLATION;  Surgeon: Malachy Moan, MD;  Location: [facility];  Service: Anesthesiology;  Laterality: N/A;   RADIOLOGY WITH ANESTHESIA N/A 07/30/2021   Procedure: CT MICROWAVE ABLATION OF THE LIVER;  Surgeon: Sterling Big, MD;  Location: [facility];  Service: Radiology;  Laterality: N/A;   ROBOT ASSISTED PYELOPLASTY Right 08/03/2019   Procedure: XI ROBOTIC ASSISTED PYELOPLASTY WITH STENT  PLACEMENT;  Surgeon: Malen Gauze, MD;  Location: [facility];  Service: Urology;  Laterality: Right;  3 HRS   SHOULDER ARTHROSCOPY W/ ROTATOR CUFF REPAIR Right    XI ROBOTIC ASSISTED LOWER ANTERIOR RESECTION N/A 09/16/2018   Procedure: XI ROBOTIC ASSISTED LOWER ANTERIOR RESECTION ERAS PATHWAY;  Surgeon: Romie Levee, MD;  Location: [facility];  Service: General;  Laterality: N/A;    Allergies: Prednisone and Codeine  Medications: Prior to Admission medications   Medication Sig Start Date End Date Taking? Authorizing Provider  alprazolam Prudy Feeler) 2 MG tablet Take 2 mg by mouth every morning.     [provider]  buPROPion (WELLBUTRIN XL) 300 MG 24 hr tablet Take 300 mg by mouth every morning.     [provider]  cyclobenzaprine (FLEXERIL) 10 MG tablet Take 20 mg by mouth at bedtime.    [provider]  gabapentin (NEURONTIN) 300 MG capsule Take 1 capsule (300 mg total) by mouth 2 (two) times daily. 11/02/22   Ladene Artist, MD  HYDROcodone-acetaminophen (NORCO) 10-325 MG tablet Take 1 tablet by mouth every 6 (six) hours as needed for moderate pain or severe pain (severe migraine headache). Post-Operatively. 01/29/23   Ladene Artist, MD  NON FORMULARY Pt takes "15 day cleanse - gut support" from [employer]. Taking 1 daily    [provider]  penicillin v potassium (VEETID) 500 MG tablet Take 1 tablet (500 mg total) by mouth 4 (four) times daily. Future refills per dentist 09/27/23   Ladene Artist, MD  rOPINIRole (REQUIP) 1 MG tablet Take 1 mg by mouth at bedtime.    [provider]  traMADol (ULTRAM) 50 MG tablet Take 50 mg by mouth every 6 (six) hours as needed. 12/03/22   [provider]     Family History  Problem Relation Age of Onset   COPD Father        smoker   Stomach cancer Father        spread to lungs and bones   Arthritis Mother    Heart attack Paternal Grandmother    Heart attack Paternal Grandfather     Heart attack Maternal Grandmother    Stroke Maternal Grandmother    Colon cancer Neg Hx    Rectal cancer Neg Hx    Esophageal cancer Neg Hx     Social History   Socioeconomic History   Marital status: Divorced    Spouse name: Not on file   Number of children: 1   Years of education: Not on file   Highest education level: Not on file  Occupational History   Occupation: Loss adjuster, chartered  Tobacco Use   Smoking status: Former    Current packs/day: 0.00    Average packs/day: 1 pack/day for 30.0  years (30.0 ttl pk-yrs)    Types: Cigarettes    Start date: 09/16/1988    Quit date: 09/16/2018    Years since quitting: 5.0   Smokeless tobacco: Never  Vaping Use   Vaping status: Never Used  Substance and Sexual Activity   Alcohol use: Yes    Comment: social   Drug use: Never   Sexual activity: Yes  Other Topics Concern   Not on file  Social History Narrative   Not on file   Social Drivers of Health   Financial Resource Strain: Not on file  Food Insecurity: Not on file  Transportation Needs: Not on file  Physical Activity: Not on file  Stress: Not on file  Social Connections: Not on file    ECOG Status: 0 - Asymptomatic  Review of Systems  Review of Systems: A 12 point ROS discussed and pertinent positives are indicated in the HPI above.  All other systems are negative.    Physical Exam No direct physical exam was performed (except for noted visual exam findings with Video Visits).    Vital Signs: LMP  (LMP Unknown)   Imaging: IR Radiologist Eval & Mgmt Result Date: 10/21/2023 EXAM: ESTABLISHED PATIENT OFFICE VISIT CHIEF COMPLAINT: SEE NOTE IN EPIC HISTORY OF PRESENT ILLNESS: SEE NOTE IN EPIC REVIEW OF SYSTEMS: SEE NOTE IN EPIC PHYSICAL EXAMINATION: SEE NOTE IN EPIC ASSESSMENT AND PLAN: SEE NOTE IN EPIC Electronically Signed   By: Malachy Moan M.D.   On: 10/21/2023 11:35   CT CHEST ABDOMEN PELVIS W CONTRAST Result Date: 10/07/2023 CLINICAL DATA:  Metastatic rectal  cancer. History of prior liver ablation. * Tracking Code: BO * EXAM: CT CHEST, ABDOMEN, AND PELVIS WITH CONTRAST TECHNIQUE: Multidetector CT imaging of the chest, abdomen and pelvis was performed following the standard protocol during bolus administration of intravenous contrast. RADIATION DOSE REDUCTION: This exam was performed according to the departmental dose-optimization program which includes automated exposure control, adjustment of the mA and/or kV according to patient size and/or use of iterative reconstruction technique. CONTRAST:  80mL OMNIPAQUE IOHEXOL 300 MG/ML  SOLN COMPARISON:  CT scan 10/27/2022 and older.  MRI abdomen 01/27/2023. FINDINGS: CT CHEST FINDINGS Cardiovascular: Heart is nonenlarged. No significant pericardial effusion. The thoracic aorta has a normal course and caliber with mild partially calcified atherosclerotic plaque. Mediastinum/Nodes: No specific abnormal lymph node enlargement identified in the axillary regions, hilum or mediastinum. Slightly patulous thoracic esophagus. Multiple thyroid cystic foci. Unchanged from previous. Lungs/Pleura: No consolidation, pneumothorax or effusion. No dominant lung mass identified. There is some debris in the trachea. Minimal right apical subpleural blebs. Musculoskeletal: Slight curvature of the spine with some degenerative changes. CT ABDOMEN PELVIS FINDINGS Hepatobiliary: Mild fatty liver infiltration. Patent portal vein. Contracted gallbladder with stones. Once again there are 2 low-density liver lesions identified. Small focus seen along segment 3 inferior posteriorly today on series 2, image 63 measuring 14 by 11 mm, not significantly changed from previous when adjusting for measurement variation. Is a larger focus somewhat exophytic along the posterior aspect of the right hepatic lobe in segment 6. This has some mixed areas of density including some areas of fat density. Lesion measures 3.0 x 2.4 cm and previously 2.9 x 2.6 cm, not  significantly changed. No new space-occupying liver lesion at this time. Pancreas: Diffuse moderate atrophy of the pancreas. Spleen: Normal in size without focal abnormality. Adrenals/Urinary Tract: There is moderate atrophy of the right kidney globally. No enhancing bilateral renal mass or collecting system dilatation. The ureters  have normal course and caliber extending down to the relatively collapsed urinary bladder. Stomach/Bowel: Oral contrast was administered. The stomach has a normal course and caliber. Small bowel has a normal course and caliber. Large bowel has moderate colonic stool. Redundant course of the transverse colon extending into the pelvis. Normal retrocecal appendix. Surgical changes along the rectosigmoid colon. No abnormal soft tissue along the surgical margin. Vascular/Lymphatic: Circumaortic left renal vein. Normal caliber aorta and IVC. Mild vascular calcifications. Small fat containing umbilical hernia. Reproductive: Uterus and bilateral adnexa are unremarkable. Other: Tiny fat containing umbilical hernia. Musculoskeletal: Scattered degenerative changes of the spine and pelvis. Particularly along the lower lumbar spine with the associated pars defects at L5 and grade 2 anterolisthesis. IMPRESSION: Surgical changes along distal colonic resection and primary anastomosis. Stable ablation sites along the liver. No new mass lesion or clear signs of recurrence. No new lymph node enlargement seen in the chest, abdomen or pelvis. Gallstones. Electronically Signed   By: Karen Kays M.D.   On: 10/07/2023 14:44    Labs:  CBC: No results for input(s): "WBC", "HGB", "HCT", "PLT" in the last 8760 hours.  COAGS: No results for input(s): "INR", "APTT" in the last 8760 hours.  BMP: Recent Labs    10/27/22 1449 11/02/22 1024 05/03/23 1130 10/01/23 1510  NA 138 138 142 141  K 4.1 3.8 3.7 4.0  CL 103 104 104 103  CO2 27 25 29 30   GLUCOSE 102* 154* 82 89  BUN 19 23* 24* 22*  CALCIUM  10.6* 9.6  8.9 9.9 9.8  CREATININE 1.12* 1.06* 1.07* 1.07*  GFRNONAA 57* >60 60* 59*    LIVER FUNCTION TESTS: Recent Labs    11/02/22 1024 05/03/23 1130 10/01/23 1510  BILITOT 0.6 0.7 0.6  AST 20 21 21   ALT 38 26 23  ALKPHOS 107 81 101  PROT 7.6 7.6 8.2*  ALBUMIN 4.3 4.3 4.3    TUMOR MARKERS: Recent Labs    10/27/22 1449 01/29/23 1139 05/03/23 1130 10/01/23 1510  CEA 1.84 1.49 1.23 1.55    Assessment and Plan:  63 year old female with colon cancer metastatic to the liver status post percutaneous microwave ablation x2.  She is now doing well at 27 months post procedure with no evidence of residual or recurrent disease.   CT CAP shows she remains in remission and CEA remains normal.   She also c/o right lower extremity hip pain, possibly radiculopathy from her lumbar degenerative disc disease.  We briefly discussed pursuing steroid injections but she would like to hold off for now.    1.) Follow-up clinic visit after her next set of surveillance imaging (approximately 6 months)     Electronically Signed: Sterling Big 10/21/2023, 12:40 PM   I spent a total of  15 Minutes in remote  clinical consultation, greater than 50% of which was counseling/coordinating care for metastatic colon cancer.    Visit type: Audio only (telephone). Audio (no video) only due to patient preference. Alternative for in-person consultation at [facility], [address] [place], [place], [place]. This visit type was conducted due to national recommendations for restrictions regarding the COVID-19 Pandemic (e.g. social distancing).  This format is felt to be most appropriate for this patient at this time.  All issues noted in this document were discussed and addressed.

## 2023-10-22 ENCOUNTER — Other Ambulatory Visit: Payer: Self-pay | Admitting: *Deleted

## 2023-10-22 ENCOUNTER — Inpatient Hospital Stay: Payer: Medicaid Other | Attending: Oncology | Admitting: Oncology

## 2023-10-22 DIAGNOSIS — C20 Malignant neoplasm of rectum: Secondary | ICD-10-CM

## 2023-10-22 NOTE — Progress Notes (Signed)
Per Dr. Truett Perna: Reschedule for CEA/OV in 3 months. Scheduling message sent.

## 2023-11-01 ENCOUNTER — Other Ambulatory Visit: Payer: Self-pay | Admitting: Oncology

## 2023-11-02 ENCOUNTER — Encounter: Payer: Self-pay | Admitting: *Deleted

## 2023-11-02 NOTE — Progress Notes (Signed)
Received refill request for Penicillin VK. Notified pharmacy that refills need to come from her dentist as was noted on her last script that Dr. Truett Perna approved.

## 2023-12-09 ENCOUNTER — Other Ambulatory Visit: Payer: Self-pay | Admitting: Interventional Radiology

## 2023-12-09 DIAGNOSIS — C189 Malignant neoplasm of colon, unspecified: Secondary | ICD-10-CM

## 2023-12-16 ENCOUNTER — Ambulatory Visit
Admission: RE | Admit: 2023-12-16 | Discharge: 2023-12-16 | Disposition: A | Discharge status: Home / Self Care | Payer: Medicare (Managed Care) | Source: Ambulatory Visit | Attending: Interventional Radiology | Admitting: Interventional Radiology

## 2023-12-16 DIAGNOSIS — C189 Malignant neoplasm of colon, unspecified: Secondary | ICD-10-CM

## 2023-12-16 HISTORY — PX: IR RADIOLOGIST EVAL & MGMT: IMG5224

## 2023-12-16 NOTE — Progress Notes (Signed)
 Chief Complaint: Patient was consulted remotely today (TeleHealth) for right hip pain at the request of Alson Mcpheeters K.    Referring Physician(s): Kerri Asche K  History of Present Illness: Norma Colon is a 63 y.o. female Well-known to me.  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  We successfully treated her liver lesions with percutaneous ablations.  She has been doing well and is in disease remission but continues to follow-up with me to review her surveillance imaging.  At our last visit she was complaining of right hip pain.  We reviewed her images and I saw no evidence of metastatic disease in the right hip region.  We had a follow-up appointment today where she continues to complain of right hip pain which is quite severe.  She has had no new imaging since the last time we spoke.  I did pull up her images and evaluate them again with her.  There is no evidence of osseous metastatic disease or abnormal mass within the musculature at the time of imaging from 10/01/2023.  She does have bilateral L5 pars defects with significant grade 2 anterolisthesis of L5 on S1 and associated degenerative disc disease.  I strongly suspect this is the source of her underlying right hip pain.  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[facility]) oncologist-- dr Truett Perna--  per last note in epic ,  clinical remission   dx 08/ 2019---- chemoradiation concurrent completed 07-20-2018;  s/p  low anterior resection 09-16-2018;   chemo 10-17-2018  ot 12-19-2018   Anxiety    Arthritis    Chemotherapy induced neutropenia (HCC)    Depression    Headache    migraines    History of HPV infection    History of kidney stones    Hydronephrosis, right    Restless leg syndrome    Scoliosis    Sepsis (HCC)    11-2018    Past Surgical History:  Procedure Laterality Date   COLONOSCOPY     COLPOSCOPY  04/04/2018   CYSTOSCOPY W/ RETROGRADES Right  07/31/2021   Procedure: CYSTOSCOPY WITH RETROGRADE PYELOGRAM, URETEROSCOPY AND STENT PLACEMENT;  Surgeon: Jannifer Hick, MD;  Location: [facility];  Service: Urology;  Laterality: Right;   CYSTOSCOPY W/ URETERAL STENT PLACEMENT Right 09/04/2021   Procedure: CYSTOSCOPY WITH RETROGRADE PYELOGRAM WITH STENT REMOVAL;  Surgeon: Malen Gauze, MD;  Location: [facility];  Service: Urology;  Laterality: Right;   CYSTOSCOPY WITH RETROGRADE PYELOGRAM, URETEROSCOPY AND STENT PLACEMENT Right 12/12/2018   Procedure: CYSTOSCOPY WITH RETROGRADE PYELOGRAM, URETEROSCOPY, STONE EXTRACTION AND STENT PLACEMENT;  Surgeon: Malen Gauze, MD;  Location: [facility];  Service: Urology;  Laterality: Right;   CYSTOSCOPY WITH RETROGRADE PYELOGRAM, URETEROSCOPY AND STENT PLACEMENT Right 04/17/2019   Procedure: CYSTOSCOPY WITH RETROGRADE PYELOGRAM, BALLOON DILITATION,DIAGNOSTIC  URETEROSCOPY AND STENT PLACEMENT;  Surgeon: Malen Gauze, MD;  Location: [facility];  Service: Urology;  Laterality: Right;   CYSTOSCOPY WITH STENT PLACEMENT Right 11/21/2018   Procedure: CYSTOSCOPY, RIGHT RETROGRADE PYELOGRAM, WITH RIGHT URETERAL STENT PLACEMENT;  Surgeon: Malen Gauze, MD;  Location: [facility];  Service: Urology;  Laterality: Right;   IR IMAGING GUIDED PORT INSERTION  09/04/2020   IR NEPHROSTOMY PLACEMENT RIGHT  06/22/2019   IR PATIENT EVAL TECH 0-60 MINS  06/28/2019   IR RADIOLOGIST EVAL & MGMT  06/06/2020   IR RADIOLOGIST EVAL & MGMT  07/17/2020   IR RADIOLOGIST EVAL & MGMT  08/15/2020   IR RADIOLOGIST EVAL & MGMT  09/03/2020   IR RADIOLOGIST EVAL & MGMT  12/18/2020   IR RADIOLOGIST EVAL & MGMT  02/25/2021   IR RADIOLOGIST EVAL & MGMT  06/23/2021   IR RADIOLOGIST EVAL & MGMT  08/12/2021   IR RADIOLOGIST EVAL & MGMT  10/15/2021   IR RADIOLOGIST EVAL & MGMT  01/13/2022   IR RADIOLOGIST EVAL & MGMT  04/14/2022   IR RADIOLOGIST EVAL & MGMT  07/23/2022   IR RADIOLOGIST EVAL & MGMT   02/09/2023   IR RADIOLOGIST EVAL & MGMT  10/21/2023   IR RADIOLOGIST EVAL & MGMT  12/16/2023   IR REMOVAL TUN ACCESS W/ PORT W/O FL MOD SED  01/29/2022   left forearm surgery due to calcium rock      RADIOLOGY WITH ANESTHESIA N/A 06/19/2020   Procedure: CT WITH ANESTHESIA MICROWAVE ABLATION;  Surgeon: Malachy Moan, MD;  Location: [facility];  Service: Anesthesiology;  Laterality: N/A;   RADIOLOGY WITH ANESTHESIA N/A 07/30/2021   Procedure: CT MICROWAVE ABLATION OF THE LIVER;  Surgeon: Sterling Big, MD;  Location: [facility];  Service: Radiology;  Laterality: N/A;   ROBOT ASSISTED PYELOPLASTY Right 08/03/2019   Procedure: XI ROBOTIC ASSISTED PYELOPLASTY WITH STENT PLACEMENT;  Surgeon: Malen Gauze, MD;  Location: [facility];  Service: Urology;  Laterality: Right;  3 HRS   SHOULDER ARTHROSCOPY W/ ROTATOR CUFF REPAIR Right    XI ROBOTIC ASSISTED LOWER ANTERIOR RESECTION N/A 09/16/2018   Procedure: XI ROBOTIC ASSISTED LOWER ANTERIOR RESECTION ERAS PATHWAY;  Surgeon: Romie Levee, MD;  Location: [facility];  Service: General;  Laterality: N/A;    Allergies: Prednisone and Codeine  Medications: Prior to Admission medications   Medication Sig Start Date End Date Taking? Authorizing Provider  alprazolam Prudy Feeler) 2 MG tablet Take 2 mg by mouth every morning.     [provider]  buPROPion (WELLBUTRIN XL) 300 MG 24 hr tablet Take 300 mg by mouth every morning.     [provider]  cyclobenzaprine (FLEXERIL) 10 MG tablet Take 20 mg by mouth at bedtime.    [provider]  gabapentin (NEURONTIN) 300 MG capsule Take 1 capsule (300 mg total) by mouth 2 (two) times daily. 11/02/22   Ladene Artist, MD  HYDROcodone-acetaminophen (NORCO) 10-325 MG tablet Take 1 tablet by mouth every 6 (six) hours as needed for moderate pain or severe pain (severe migraine headache). Post-Operatively. 01/29/23   Ladene Artist, MD  NON FORMULARY Pt takes "15 day cleanse - gut support" from [employer]. Taking 1 daily    [provider]  penicillin v potassium (VEETID) 500 MG tablet Take 1 tablet (500 mg total) by mouth 4 (four) times daily. Future refills per dentist 09/27/23   Ladene Artist, MD  rOPINIRole (REQUIP) 1 MG tablet Take 1 mg by mouth at bedtime.    [provider]  traMADol (ULTRAM) 50 MG tablet Take 50 mg by mouth every 6 (six) hours as needed. 12/03/22   [provider]     Family History  Problem Relation Age of Onset   COPD Father        smoker   Stomach cancer Father        spread to lungs and bones   Arthritis Mother    Heart attack Paternal Grandmother    Heart attack Paternal Grandfather    Heart attack Maternal Grandmother    Stroke Maternal Grandmother    Colon  cancer Neg Hx    Rectal cancer Neg Hx    Esophageal cancer Neg Hx     Social History   Socioeconomic History   Marital status: Divorced    Spouse name: Not on file   Number of children: 1   Years of education: Not on file   Highest education level: Not on file  Occupational History   Occupation: Loss adjuster, chartered  Tobacco Use   Smoking status: Former    Current packs/day: 0.00    Average packs/day: 1 pack/day for 30.0 years (30.0 ttl pk-yrs)    Types: Cigarettes    Start date: 09/16/1988    Quit date: 09/16/2018    Years since quitting: 5.2   Smokeless tobacco: Never  Vaping Use   Vaping status: Never Used  Substance and Sexual Activity   Alcohol use: Yes    Comment: social   Drug use: Never   Sexual activity: Yes  Other Topics Concern   Not on file  Social History Narrative   Not on file   Social Drivers of Health   Financial Resource Strain: Not on file  Food Insecurity: Not on file  Transportation Needs: Not on file  Physical Activity: Not on file  Stress: Not on file  Social Connections: Not on file    Review of Systems  Review of Systems: A 12 point ROS discussed and pertinent positives are indicated in the HPI above.  All  other systems are negative.    Physical Exam No direct physical exam was performed (except for noted visual exam findings with Video Visits).    Vital Signs: LMP  (LMP Unknown)   Imaging: IR Radiologist Eval & Mgmt Result Date: 12/16/2023 EXAM: NEW PATIENT OFFICE VISIT CHIEF COMPLAINT: SEE NOTE IN EPIC HISTORY OF PRESENT ILLNESS: SEE NOTE IN EPIC REVIEW OF SYSTEMS: SEE NOTE IN EPIC PHYSICAL EXAMINATION: SEE NOTE IN EPIC ASSESSMENT AND PLAN: SEE NOTE IN EPIC Electronically Signed   By: Malachy Moan M.D.   On: 12/16/2023 14:17    Labs:  CBC: No results for input(s): "WBC", "HGB", "HCT", "PLT" in the last 8760 hours.  COAGS: No results for input(s): "INR", "APTT" in the last 8760 hours.  BMP: Recent Labs    05/03/23 1130 10/01/23 1510  NA 142 141  K 3.7 4.0  CL 104 103  CO2 29 30  GLUCOSE 82 89  BUN 24* 22*  CALCIUM 9.9 9.8  CREATININE 1.07* 1.07*  GFRNONAA 60* 59*    LIVER FUNCTION TESTS: Recent Labs    05/03/23 1130 10/01/23 1510  BILITOT 0.7 0.6  AST 21 21  ALT 26 23  ALKPHOS 81 101  PROT 7.6 8.2*  ALBUMIN 4.3 4.3    TUMOR MARKERS: Recent Labs    01/29/23 1139 05/03/23 1130 10/01/23 1510  CEA 1.49 1.23 1.55    Assessment and Plan:  63 year old female with a history of colon cancer metastatic to the liver.  We had a visit today regarding her persistent right hip pain.  I reviewed her images again and still do not see any evidence of metastatic disease to the right hip.  She does have significant L5-S1 degenerative disc disease with grade 2 anterolisthesis from chronic bilateral pars defects.  I told her I think her degenerative disc disease may be causing her right hip pain as a form of radiculopathy.  She has a follow-up appointment with her primary care doctor tomorrow and will have them look at it as well.  I will follow-up  with her again after her next round of imaging.     Electronically Signed: Sterling Big 12/16/2023, 2:58  PM   I spent a total of   5 Minutes in remote  clinical consultation, greater than 50% of which was counseling/coordinating care for colon cancer mets to liver.    Visit type: Audio and video Financial planner).   Alternative for in-person consultation at [facility], [address] [place], [place], [place]. This visit type was conducted due to national recommendations for restrictions regarding the COVID-19 Pandemic (e.g. social distancing).  This format is felt to be most appropriate for this patient at this time.  All issues noted in this document were discussed and addressed.

## 2023-12-17 ENCOUNTER — Telehealth: Payer: Self-pay | Admitting: *Deleted

## 2023-12-17 NOTE — Telephone Encounter (Signed)
 Called to inquire when Dr. Truett Perna will be ordering her next MRI abdomen (last was 01/27/23)? Dr. Archer Asa said he will see her again after next round of imaging. Has been having severe right hip pain as well-seeing her PCP today and will discuss this issue.

## 2023-12-24 ENCOUNTER — Other Ambulatory Visit: Payer: Self-pay | Admitting: Oncology

## 2023-12-24 DIAGNOSIS — C20 Malignant neoplasm of rectum: Secondary | ICD-10-CM

## 2023-12-27 ENCOUNTER — Telehealth: Payer: Self-pay | Admitting: *Deleted

## 2023-12-27 NOTE — Telephone Encounter (Signed)
 Per Dr. Truett Perna: orders placed for MR Liver at [address] in April. OK to wait till June since December 2024 CT was negative.  Left this message for Rekita on voice mail with the phone # to call and schedule her scan or we can schedule it when she is here in April seeing Dr. Truett Perna. [facility] will not schedule a MRI unless it is the patient or patient is with nurse.

## 2024-01-20 ENCOUNTER — Encounter: Payer: Self-pay | Admitting: *Deleted

## 2024-01-20 ENCOUNTER — Inpatient Hospital Stay: Payer: Medicaid Other | Admitting: Oncology

## 2024-01-20 ENCOUNTER — Inpatient Hospital Stay: Payer: Medicaid Other | Attending: Oncology

## 2024-01-20 NOTE — Progress Notes (Unsigned)
error 

## 2024-01-20 NOTE — Progress Notes (Signed)
 Norma Colon was "no show" for lab/OV today. Scheduling message sent to reschedule her for late April or early May.

## 2024-01-21 ENCOUNTER — Telehealth: Payer: Self-pay | Admitting: Oncology

## 2024-01-21 NOTE — Telephone Encounter (Signed)
 Contacted pt to schedule an appt. Unable to reach via phone, voicemail was left.   Scheduling Message Entered by Elsa Halls on 01/20/2024 at 10:45 AM Priority: Routine <No visit type provided>  Department: [facility]  Provider:  Scheduling Notes:  "No show" on 4/17. Please reschedule her lab/OV with Dr. Scherrie Curt for late April or early May.

## 2024-01-21 NOTE — Telephone Encounter (Signed)
 Called to r/s missed appts/ LVM to return call for scheduling.

## 2024-01-21 NOTE — Telephone Encounter (Signed)
 Patient has been scheduled. Aware of appt date and time.

## 2024-01-23 ENCOUNTER — Ambulatory Visit
Admission: RE | Admit: 2024-01-23 | Discharge: 2024-01-23 | Disposition: A | Discharge status: Home / Self Care | Payer: Medicare (Managed Care) | Source: Ambulatory Visit | Attending: Oncology | Admitting: Oncology

## 2024-01-23 DIAGNOSIS — C20 Malignant neoplasm of rectum: Secondary | ICD-10-CM

## 2024-01-23 MED ORDER — GADOPICLENOL 0.5 MMOL/ML IV SOLN
10.0000 mL | Freq: Once | INTRAVENOUS | Status: AC | PRN
  Administered 2024-01-23: 10 mL via INTRAVENOUS

## 2024-01-25 ENCOUNTER — Other Ambulatory Visit: Payer: Self-pay | Admitting: Interventional Radiology

## 2024-01-25 DIAGNOSIS — C20 Malignant neoplasm of rectum: Secondary | ICD-10-CM

## 2024-01-26 ENCOUNTER — Telehealth: Payer: Self-pay

## 2024-01-26 NOTE — Telephone Encounter (Signed)
 Patient gave verbal understanding and had no further questions at this time.

## 2024-01-26 NOTE — Telephone Encounter (Signed)
-----   Message from Coni Deep sent at 01/25/2024  7:32 PM EDT ----- Please call patient, the MRI is negative for recurrent cancer, f/u as scheduled

## 2024-01-27 ENCOUNTER — Ambulatory Visit
Admission: RE | Admit: 2024-01-27 | Discharge: 2024-01-27 | Disposition: A | Discharge status: Home / Self Care | Payer: Medicare (Managed Care) | Source: Ambulatory Visit | Attending: Interventional Radiology | Admitting: Interventional Radiology

## 2024-01-27 DIAGNOSIS — C20 Malignant neoplasm of rectum: Secondary | ICD-10-CM

## 2024-01-27 HISTORY — PX: IR RADIOLOGIST EVAL & MGMT: IMG5224

## 2024-01-27 NOTE — Progress Notes (Signed)
 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    Referring Physician(s): Relena Ivancic K  History of Present Illness: Norma Colon is a 63 y.o. female ho initially presented at the kind request of Dr. 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  Norma Colon was initially diagnosed with a partially obstructing rectal mass in August 2019.  She underwent neoadjuvant combined radiation and chemotherapy (Xeloda ) from September through October 2019 followed by a low anterior resection in December 2019.     Subsequently, she underwent 4 cycles of additional adjuvant chemotherapy with CAPOX completed in March 2020.  Subsequent surveillance imaging initially detected no evidence of metastatic disease.  Unfortunately, CT imaging from July 2021 demonstrated a subtle lesion in hepatic segment 6 concerning for metastatic disease.  A follow-up MRI dated 05/08/2020 confirms a 2.1 cm solid lesion in hepatic segment 6 as well as a concerning for millimeter lesion in the posterior aspect of segment 3.  Both lesions are concerning for metastatic disease.   She underwent concurrent ultrasound-guided biopsy and microwave ablation of the 2 liver lesions on 06/19/2020.     Follow-up MRI imaging from 08/13/20 showed an excellent treatment effect at the sites of the 2 ablated lesions, but 2 new lesions highly concerning for additional metastatic foci.  Norma Colon therefore went back for additional chemotherapy and has now completed another cycle of FOLFIRI/Avastin  on 11/28/2020.  She reported increased malaise following this cycle of chemotherapy.  She had diarrhea and mild nosebleeding.   Restaging CT scans of the chest, abdomen and pelvis dated 12/12/2020 demonstrates no evidence of new metastatic disease or disease outside the liver.  The previously  identified tiny hypervascular lesions in segment 4A and segment 6 are less conspicuous and perhaps slightly smaller consistent with interval treatment response.     US  imaging performed by me on  12/18/20 and again on 02/25/21 showed no visible hepatic lesions.    Re-staging CT scans 06/17/21 - New 1.7 cm lesion adjacent to the posterosuperior margin of the segment 6 ablation defect concerning for recurrent disease.    She underwent CT-guided biopsy and repeat microwave ablation of the recurrent tumor on 07/30/2021.  Unfortunately, at that time she sustained an inadvertent injury to the right ureter presumably due to unfortunate Foley catheter placement with in the reimplanted ureter.  Fortunately, we were able to diagnose this within the first day and urology was able to traverse the injury and place a double-J ureteral stent.  She also developed acute kidney injury with elevated creatinine due to to peritoneal reabsorption of the leaking urine.   CT CAP 10/13/2021: Post ablation defects in the liver without evidence of progressive disease or new metastatic disease in the chest, abdomen or pelvis.   MRI Abd 01/12/22: No new suspicious mass or lymphadenopathy identified in the abdomen. Previously ablated liver lesions without suspicious enhancement.   MRI Abd 04/10/22: Stable appearance of previously ablated lesions in segment 3 and 6 of the liver, as above. No signs of local recurrence of disease, and no new metastatic lesions noted in the liver on today's examination.   MRI Abd 07/13/22: Stable size/appearance of the previously treated segment VI and III hepatic lesions, without convincing evidence of recurrent disease. No new suspicious hepatic lesion identified.   MRI Abd 01/29/23:  Stable appearance of the ablation defects in the posterior right hepatic lobe (  segment VI) and lateral segment left liver (segment III). No evidence for local recurrence. No new suspicious lesion in the liver on today's  study.   CT CAP 10/01/23: Surgical changes along distal colonic resection and primary anastomosis. Stable ablation sites along the liver. No new mass lesion or clear signs of recurrence.  MRI Abd 02/02/24: Slight interval contraction of post ablation changes involving the posterior hepatic segments 3 and 6. No evidence of recurrent or metastatic disease in the abdomen.   Overall, Norma Colon continues to do well.  She does complain of persistent right hip pain.  This is being addressed and she is scheduled to get some shots on the 14th.  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[facility]) oncologist-- dr Scherrie Curt--  per last note in epic ,  clinical remission   dx 08/ 2019---- chemoradiation concurrent completed 07-20-2018;  s/p  low anterior resection 09-16-2018;   chemo 10-17-2018  ot 12-19-2018   Anxiety    Arthritis    Chemotherapy induced neutropenia (HCC)    Depression    Headache    migraines    History of HPV infection    History of kidney stones    Hydronephrosis, right    Restless leg syndrome    Scoliosis    Sepsis (HCC)    11-2018    Past Surgical History:  Procedure Laterality Date   COLONOSCOPY     COLPOSCOPY  04/04/2018   CYSTOSCOPY W/ RETROGRADES Right 07/31/2021   Procedure: CYSTOSCOPY WITH RETROGRADE PYELOGRAM, URETEROSCOPY AND STENT PLACEMENT;  Surgeon: Lahoma Pigg, MD;  Location: [facility];  Service: Urology;  Laterality: Right;   CYSTOSCOPY W/ URETERAL STENT PLACEMENT Right 09/04/2021   Procedure: CYSTOSCOPY WITH RETROGRADE PYELOGRAM WITH STENT REMOVAL;  Surgeon: Marco Severs, MD;  Location: [facility];  Service: Urology;  Laterality: Right;   CYSTOSCOPY WITH RETROGRADE PYELOGRAM, URETEROSCOPY AND STENT PLACEMENT Right 12/12/2018   Procedure: CYSTOSCOPY WITH RETROGRADE PYELOGRAM, URETEROSCOPY, STONE EXTRACTION AND STENT PLACEMENT;  Surgeon: Marco Severs, MD;  Location: [facility];  Service: Urology;  Laterality: Right;    CYSTOSCOPY WITH RETROGRADE PYELOGRAM, URETEROSCOPY AND STENT PLACEMENT Right 04/17/2019   Procedure: CYSTOSCOPY WITH RETROGRADE PYELOGRAM, BALLOON DILITATION,DIAGNOSTIC  URETEROSCOPY AND STENT PLACEMENT;  Surgeon: Marco Severs, MD;  Location: [facility];  Service: Urology;  Laterality: Right;   CYSTOSCOPY WITH STENT PLACEMENT Right 11/21/2018   Procedure: CYSTOSCOPY, RIGHT RETROGRADE PYELOGRAM, WITH RIGHT URETERAL STENT PLACEMENT;  Surgeon: Marco Severs, MD;  Location: [facility];  Service: Urology;  Laterality: Right;   IR IMAGING GUIDED PORT INSERTION  09/04/2020   IR NEPHROSTOMY PLACEMENT RIGHT  06/22/2019   IR PATIENT EVAL TECH 0-60 MINS  06/28/2019   IR RADIOLOGIST EVAL & MGMT  06/06/2020   IR RADIOLOGIST EVAL & MGMT  07/17/2020   IR RADIOLOGIST EVAL & MGMT  08/15/2020   IR RADIOLOGIST EVAL & MGMT  09/03/2020   IR RADIOLOGIST EVAL & MGMT  12/18/2020   IR RADIOLOGIST EVAL & MGMT  02/25/2021   IR RADIOLOGIST EVAL & MGMT  06/23/2021   IR RADIOLOGIST EVAL & MGMT  08/12/2021   IR RADIOLOGIST EVAL & MGMT  10/15/2021   IR RADIOLOGIST EVAL & MGMT  01/13/2022   IR RADIOLOGIST EVAL & MGMT  04/14/2022   IR RADIOLOGIST EVAL & MGMT  07/23/2022   IR RADIOLOGIST EVAL & MGMT  02/09/2023   IR RADIOLOGIST EVAL & MGMT  10/21/2023   IR RADIOLOGIST  EVAL & MGMT  12/16/2023   IR RADIOLOGIST EVAL & MGMT  01/27/2024   IR REMOVAL TUN ACCESS W/ PORT W/O FL MOD SED  01/29/2022   left forearm surgery due to calcium  rock      RADIOLOGY WITH ANESTHESIA N/A 06/19/2020   Procedure: CT WITH ANESTHESIA MICROWAVE ABLATION;  Surgeon: Fernando Hoyer, MD;  Location: [facility];  Service: Anesthesiology;  Laterality: N/A;   RADIOLOGY WITH ANESTHESIA N/A 07/30/2021   Procedure: CT MICROWAVE ABLATION OF THE LIVER;  Surgeon: Roxie Cord, MD;  Location: [facility];  Service: Radiology;  Laterality: N/A;   ROBOT ASSISTED PYELOPLASTY Right 08/03/2019   Procedure: XI ROBOTIC ASSISTED PYELOPLASTY WITH STENT  PLACEMENT;  Surgeon: Marco Severs, MD;  Location: [facility];  Service: Urology;  Laterality: Right;  3 HRS   SHOULDER ARTHROSCOPY W/ ROTATOR CUFF REPAIR Right    XI ROBOTIC ASSISTED LOWER ANTERIOR RESECTION N/A 09/16/2018   Procedure: XI ROBOTIC ASSISTED LOWER ANTERIOR RESECTION ERAS PATHWAY;  Surgeon: Joyce Nixon, MD;  Location: [facility];  Service: General;  Laterality: N/A;    Allergies: Prednisone and Codeine  Medications: Prior to Admission medications   Medication Sig Start Date End Date Taking? Authorizing Provider  alprazolam  (XANAX ) 2 MG tablet Take 2 mg by mouth every morning.     [provider]  buPROPion  (WELLBUTRIN  XL) 300 MG 24 hr tablet Take 300 mg by mouth every morning.     [provider]  cyclobenzaprine  (FLEXERIL ) 10 MG tablet Take 20 mg by mouth at bedtime.    [provider]  gabapentin  (NEURONTIN ) 300 MG capsule Take 1 capsule (300 mg total) by mouth 2 (two) times daily. 11/02/22   Sumner Ends, MD  HYDROcodone -acetaminophen  (NORCO) 10-325 MG tablet Take 1 tablet by mouth every 6 (six) hours as needed for moderate pain or severe pain (severe migraine headache). Post-Operatively. 01/29/23   Sumner Ends, MD  NON FORMULARY Pt takes "15 day cleanse - gut support" from [employer]. Taking 1 daily    [provider]  penicillin  v potassium (VEETID) 500 MG tablet Take 1 tablet (500 mg total) by mouth 4 (four) times daily. Future refills per dentist 09/27/23   Sumner Ends, MD  rOPINIRole  (REQUIP ) 1 MG tablet Take 1 mg by mouth at bedtime.    [provider]  traMADol  (ULTRAM ) 50 MG tablet Take 50 mg by mouth every 6 (six) hours as needed. 12/03/22   [provider]     Family History  Problem Relation Age of Onset   COPD Father        smoker   Stomach cancer Father        spread to lungs and bones   Arthritis Mother    Heart attack Paternal Grandmother    Heart attack Paternal Grandfather     Heart attack Maternal Grandmother    Stroke Maternal Grandmother    Colon cancer Neg Hx    Rectal cancer Neg Hx    Esophageal cancer Neg Hx     Social History   Socioeconomic History   Marital status: Divorced    Spouse name: Not on file   Number of children: 1   Years of education: Not on file   Highest education level: Not on file  Occupational History   Occupation: Loss adjuster, chartered  Tobacco Use   Smoking status: Former    Current packs/day: 0.00    Average packs/day: 1 pack/day for 30.0 years (30.0  ttl pk-yrs)    Types: Cigarettes    Start date: 09/16/1988    Quit date: 09/16/2018    Years since quitting: 5.3   Smokeless tobacco: Never  Vaping Use   Vaping status: Never Used  Substance and Sexual Activity   Alcohol use: Yes    Comment: social   Drug use: Never   Sexual activity: Yes  Other Topics Concern   Not on file  Social History Narrative   Not on file   Social Drivers of Health   Financial Resource Strain: Not on file  Food Insecurity: Not on file  Transportation Needs: Not on file  Physical Activity: Not on file  Stress: Not on file  Social Connections: Not on file    ECOG Status: 0 - Asymptomatic  Review of Systems  Review of Systems: A 12 point ROS discussed and pertinent positives are indicated in the HPI above.  All other systems are negative.    Physical Exam No direct physical exam was performed (except for noted visual exam findings with Video Visits).    Vital Signs: LMP  (LMP Unknown)   Imaging: IR Radiologist Eval & Mgmt Result Date: 01/27/2024 EXAM: NEW PATIENT OFFICE VISIT CHIEF COMPLAINT: SEE NOTE IN EPIC HISTORY OF PRESENT ILLNESS: SEE NOTE IN EPIC REVIEW OF SYSTEMS: SEE NOTE IN EPIC PHYSICAL EXAMINATION: SEE NOTE IN EPIC ASSESSMENT AND PLAN: SEE NOTE IN EPIC Electronically Signed   By: Fernando Hoyer M.D.   On: 01/27/2024 13:59   MR LIVER W WO CONTRAST Result Date: 01/25/2024 CLINICAL DATA:  Metastatic rectal adenocarcinoma to  liver status post ablation EXAM: MRI ABDOMEN WITHOUT AND WITH CONTRAST TECHNIQUE: Multiplanar multisequence MR imaging of the abdomen was performed both before and after the administration of intravenous contrast. CONTRAST:  10 mL Vueway  COMPARISON:  CT abdomen and pelvis dated 10/01/2023, MR abdomen dated 01/27/2023 and multiple priors FINDINGS: Lower chest: No acute findings. Hepatobiliary: Hepatic steatosis. Again seen are post ablation changes of posterior segment 3 and 6 demonstrating slight interval contraction. Subcentimeter arterially enhancing foci within segment 4 (11:36), 3 (11:44) and 5/6 (11:44) are not substantially changed dating back to 05/08/2020 and likely hemangiomas. No bile duct dilation. Gallbladder is contracted, previously demonstrated to contain multiple stones on CT. Pancreas: No mass, inflammatory changes, or other parenchymal abnormality identified. Spleen:  Within normal limits in size and appearance. Adrenals/Urinary Tract: No adrenal nodules. Slightly asymmetrically smaller right kidney. No suspicious renal masses identified. No evidence of hydronephrosis. Stomach/Bowel: Visualized portions within the abdomen are unremarkable. Vascular/Lymphatic: No pathologically enlarged lymph nodes identified. No abdominal aortic aneurysm demonstrated. Other:  None. Musculoskeletal: No suspicious bone lesions identified. IMPRESSION: 1. Slight interval contraction of post ablation changes involving the posterior hepatic segments 3 and 6. No evidence of recurrent or metastatic disease in the abdomen. 2. Hepatic steatosis. 3. Cholelithiasis. Electronically Signed   By: Limin  Xu M.D.   On: 01/25/2024 18:21    Labs:  CBC: No results for input(s): "WBC", "HGB", "HCT", "PLT" in the last 8760 hours.  COAGS: No results for input(s): "INR", "APTT" in the last 8760 hours.  BMP: Recent Labs    05/03/23 1130 10/01/23 1510  NA 142 141  K 3.7 4.0  CL 104 103  CO2 29 30  GLUCOSE 82 89  BUN 24*  22*  CALCIUM  9.9 9.8  CREATININE 1.07* 1.07*  GFRNONAA 60* 59*    LIVER FUNCTION TESTS: Recent Labs    05/03/23 1130 10/01/23 1510  BILITOT 0.7 0.6  AST 21 21  ALT 26 23  ALKPHOS 81 101  PROT 7.6 8.2*  ALBUMIN 4.3 4.3    TUMOR MARKERS: Recent Labs    01/29/23 1139 05/03/23 1130 10/01/23 1510  CEA 1.49 1.23 1.55    Assessment and Plan:  63 year old female with colon cancer metastatic to the liver status post percutaneous microwave ablation x2.  She is now doing well at 30 months post procedure with no evidence of residual or recurrent disease.    1.) MRI abd with contrast and clinic visit in 6 months      Electronically Signed: Roxie Cord 01/27/2024, 2:07 PM   I spent a total of  10 Minutes in remote  clinical consultation, greater than 50% of which was counseling/coordinating care for rectal cancer metastatic to the liver.    Visit type: Audio only (telephone). Audio (no video) only due to patient's lack of internet/smartphone capability. Alternative for in-person consultation at [facility], [address] [place], [place], [place]. This visit type was conducted due to national recommendations for restrictions regarding the COVID-19 Pandemic (e.g. social distancing).  This format is felt to be most appropriate for this patient at this time.  All issues noted in this document were discussed and addressed.

## 2024-02-10 ENCOUNTER — Telehealth: Payer: Self-pay | Admitting: *Deleted

## 2024-02-10 NOTE — Telephone Encounter (Signed)
 Ms. Kostka called to request a reschedule of her 02/11/24 visit due to illness. High priority scheduling message sent to reschedule with Dr. Scherrie Curt for week of 5/12 or 5/19

## 2024-02-11 ENCOUNTER — Inpatient Hospital Stay: Attending: Oncology

## 2024-02-11 ENCOUNTER — Inpatient Hospital Stay: Admitting: Oncology

## 2024-02-16 ENCOUNTER — Inpatient Hospital Stay

## 2024-02-16 ENCOUNTER — Encounter: Payer: Self-pay | Admitting: *Deleted

## 2024-02-16 ENCOUNTER — Inpatient Hospital Stay: Attending: Oncology | Admitting: Oncology

## 2024-02-16 NOTE — Progress Notes (Signed)
"  No show" for lab/OV today. Scheduling message sent to reschedule for ~ 1 month. 

## 2024-02-23 ENCOUNTER — Telehealth: Payer: Self-pay | Admitting: Oncology

## 2024-02-23 NOTE — Telephone Encounter (Signed)
 Called to r/s appt per inbasket. LVM to return call for scheduling.

## 2024-03-30 ENCOUNTER — Inpatient Hospital Stay (HOSPITAL_BASED_OUTPATIENT_CLINIC_OR_DEPARTMENT_OTHER): Payer: Medicare (Managed Care) | Admitting: Oncology

## 2024-03-30 ENCOUNTER — Inpatient Hospital Stay: Payer: Medicare (Managed Care) | Attending: Oncology

## 2024-03-30 ENCOUNTER — Encounter: Payer: Self-pay | Admitting: Oncology

## 2024-03-30 VITALS — BP 118/79 | HR 90 | Temp 98.3°F | Resp 18 | Ht 68.0 in | Wt 213.9 lb

## 2024-03-30 DIAGNOSIS — Z85048 Personal history of other malignant neoplasm of rectum, rectosigmoid junction, and anus: Secondary | ICD-10-CM | POA: Diagnosis present

## 2024-03-30 DIAGNOSIS — G2581 Restless legs syndrome: Secondary | ICD-10-CM | POA: Diagnosis not present

## 2024-03-30 DIAGNOSIS — C787 Secondary malignant neoplasm of liver and intrahepatic bile duct: Secondary | ICD-10-CM | POA: Diagnosis not present

## 2024-03-30 DIAGNOSIS — G43909 Migraine, unspecified, not intractable, without status migrainosus: Secondary | ICD-10-CM | POA: Diagnosis not present

## 2024-03-30 DIAGNOSIS — C20 Malignant neoplasm of rectum: Secondary | ICD-10-CM

## 2024-03-30 LAB — CEA (ACCESS): CEA (CHCC): 1.87 ng/mL (ref 0.00–5.00)

## 2024-03-30 NOTE — Progress Notes (Signed)
 [facility] OFFICE PROGRESS NOTE   Diagnosis: Rectal cancer  INTERVAL HISTORY:   Norma Colon returns as scheduled.  She generally feels well.  Good appetite.  She has chronic right flank pain.  She reports receiving injection therapy by orthopedics for treatment of pain involving the right hip.  The hip pain has improved. She no longer has constipation.  An MRI of the abdomen on 01/23/2024 revealed no evidence of progressive cancer.  She saw Dr. Karalee.  He recommends an MRI at a 27-month interval.  Objective:  Vital signs in last 24 hours:  Blood pressure 118/79, pulse 90, temperature 98.3 F (36.8 C), temperature source Temporal, resp. rate 18, height 5' 8 (1.727 m), weight 213 lb 14.4 oz (97 kg), SpO2 98%.  Lymphatics: No cervical, supraclavicular, axillary, or inguinal nodes Resp: End inspiratory rhonchi at the right posterior base that cleared after several respirations, no respiratory distress Cardio: Regular rate and rhythm GI: No hepatosplenomegaly, no mass, nontender Vascular: No leg edema   Lab Results:  Lab Results  Component Value Date   WBC 6.5 11/05/2021   HGB 13.7 11/05/2021   HCT 41.8 11/05/2021   MCV 93.1 11/05/2021   PLT 270.0 11/05/2021   NEUTROABS 2.6 08/26/2021    CMP  Lab Results  Component Value Date   NA 141 10/01/2023   K 4.0 10/01/2023   CL 103 10/01/2023   CO2 30 10/01/2023   GLUCOSE 89 10/01/2023   BUN 22 (H) 10/01/2023   CREATININE 1.07 (H) 10/01/2023   CALCIUM  9.8 10/01/2023   PROT 8.2 (H) 10/01/2023   ALBUMIN 4.3 10/01/2023   AST 21 10/01/2023   ALT 23 10/01/2023   ALKPHOS 101 10/01/2023   BILITOT 0.6 10/01/2023   GFRNONAA 59 (L) 10/01/2023   GFRAA >60 06/17/2020    Lab Results  Component Value Date   CEA1 6.85 (H) 06/17/2021   CEA 1.87 03/30/2024    Lab Results  Component Value Date   INR 1.1 07/15/2021   LABPROT 14.5 07/15/2021    Imaging:  No results found.  Medications: I have reviewed the  patient's current medications.   Assessment/Plan: Rectal cancer Colonoscopy 05/17/2018 revealed a partially obstructing mass beginning at 10 cm from the anal verge, biopsy confirmed invasive moderately differentiated adenocarcinoma CTs 05/24/2018-mass beginning at proximal he 7.6 cm from the anal verge, 20 perirectal lymph nodes, no evidence of metastatic disease Pelvic MRI 06/01/2018- tumor measured at 10.8 cm from the anal sphincter, T3b, N1-2 left perirectal lymph nodes, each measuring 5 mm Radiation and Xeloda  initiated 06/13/2018, completed 07/20/2018 Low anterior resection 09/16/2018,ypT3,ypN1c, 1 cm tumor, partial response-score 2, 0/13 lymph nodes positive, 2 tumor deposits, MSI-stable, no loss of mismatch repair protein expression Cycle 1 CAPOX 10/17/2018 Cycle 2 CAPOX 11/07/2018 (oxaliplatin  dose reduced due to neutropenia) Cycle 3 CAPOX 11/28/2018 Cycle 4 CAPOX 12/19/2018 CTs 05/29/2019-no evidence of metastatic disease, severe right hydronephrosis, left thyroid  nodule CTs 04/11/2020-subtle nodular lesion in the inferior right liver suspicious for a new metastasis, status post right ureteral resection and psoas hitch/flap, resolution of hydronephrosis, sacral insufficiency fractures bilaterally MRI liver 05/08/2020-2.1 cm segment 6 lesion suspicious for a metastasis, 4 mm suspicious segment 3 lesion 4 mm segment 4B lesion consistent with a hemangioma, no abdominal lymphadenopathy, cholelithiasis Ablation of segment 6 and segment 3 lesions 06/19/2020, pathology from segment 6 biopsy confirmed metastatic adenocarcinoma-microsatellite stable, tumor mutation burden 3, K-ras wild type, NRAS wild-type, BRAF [id number] MRI abdomen 08/13/2020-ablation defects in segment 6 and segment 3, tiny  foci of restricted diffusion with arterial phase enhancement at the junctions of segment 4A and segment 4B, and segment 5 concerning for metastatic disease CTs 09/09/2020-stable ablation sites at segment 6 and segment 2. Small  focus of hyperenhancement measuring 7 mm in the inferior right liver segment 6 anterior to the ablation site, and in the anterior right liver segment 5/8-unchanged compared to MRI and concerning for metastatic disease, no other evidence of metastatic disease. Scattered irregular groundglass opacities in the posterior right upper lobe Cycle 1 FOLFIRI/Avastin  09/23/2020 Treatment held 10/08/2020 due to neutropenia Cycle 2 FOLFIRI/Avastin  10/14/2020, Udenyca  Cycle 3 FOLFIRI/Avastin  10/30/2020, Udenyca  Cycle 4 FOLFIRI/Avastin  11/14/2020, Udenyca  Cycle 5 FOLFIRI/Avastin  11/28/2020, Udenyca  CTs 12/11/2020-no evidence of metastatic disease in the chest, stable liver ablation sites, stable hypervascular lesion in segment 4A, previously described enhancing lesion in the right hepatic lobe not well appreciated, no new lesions\ Real-time ultrasound evaluation by Dr. Karalee 12/18/2020-no lesions are visible sonographically, therefore not a candidate for ablation Upper quadrant ultrasound 02/25/2021-no new or progressive hepatic metastases, prior ablation defects noted CTs 06/17/2021-unchanged 1.5 cm mixed attenuation left hepatic lesion, stable heterogenous lesion in the posterior right liver, no evidence of new metastatic disease-for review revealed 1.7 cm nodular lesion at the superior margin of the ablation site in the right liver concerning for recurrent disease CEA mildly elevated 06/17/2021 Ultrasound-guided biopsy of lesion followed by ablation encompassing the entirety of the tumor in the right liver, segment 6,, repeat CT revealed adequate ablation of the region of tumor, 07/30/2021, biopsy adenocarcinoma consistent with metastatic rectal adenocarcinoma CEA normal 08/08/2021 CT renal stone study 08/21/2021-microwave ablation defect in the right liver CT renal stone study 08/24/2021-decrease size of right liver hypodensity at site of prior microwave ablation CTs 10/13/2021-stable appearance of ablation defect in  the liver, no evidence of metastatic disease MRI abdomen 01/12/2022-previously ablated liver lesions without enhancement, 3 stable subcentimeter foci of enhancement-likely hemangiomas MRI abdomen 04/10/2022-stable appearance of previously ablated lesions in segment 3 and 6, no sign of progressive disease MRI abdomen 07/11/2022-stable appearance of previously treated segment 6 and 3 hepatic lesions, no evidence of recurrent disease, no new lesion CTs 10/27/2022-decrease size of treated hepatic lesions, no evidence for residual disease, no evidence of progressive metastatic disease stable multinodular enlargement of the thyroid  MRI abdomen 01/27/2023-stable ablation defect in the posterior right liver segment 6 and lateral left liver segment 3, no new lesion, no evidence of local recurrence CTs 10/01/2023: Stable liver ablation sites, no evidence of recurrent disease MRI abdomen 01/23/2024: Slight interval contraction of the ablation sites, no evidence of recurrent or metastatic disease History of Tobacco use Right UPJ stone on CT 05/24/2018 CT 11/21/2018-migration of previously noted right UPJ stone, new marked right hydroureteronephrosis Placement of a right JJ stent 11/21/2018 Laser lithotripsy and replacement of right JJ stent 12/12/2018 Followed by Dr. Sherrilee, urology Migraine headaches Restless legs Arm pain during the oxaliplatin  infusion and for 1 week after cycle 1.  The oxaliplatin  will be further diluted and infusion time extended getting with cycle 2. Neutropenia secondary to chemotherapy.  Oxaliplatin  dose reduced beginning with cycle 2. 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 Sherrilee, surgical reimplantation of the right ureter on 08/03/2019, right ureter stent removed 09/14/2019 Thyroid  nodules-new versus enlarged left thyroid   nodule CT 05/29/2019, thyroid  ultrasound 06/20/2019-multiple nodules, 1 right-sided nodule met criteria for ultrasound follow-up Ultrasound 05/08/2020-multinodular goiter, stable nodules in  the isthmus and right thyroid  meeting criteria for 1 year follow-up, additional stable nodules do not meet criteria for follow-up 12.  Right ureter injury following Foley catheter placement 07/30/2021-pyelogram confirmed extravasation of urine, placement of right ureter stent and Foley catheter 07/31/2021 Right ureter stent removed 09/04/2021 13.  Admission with urosepsis 08/24/2021         Disposition: Ms. Risk is in clinical remission from rectal cancer.  She will return for an office visit after a surveillance liver MRI in October.  We will plan for surveillance CTs in December of this year or January of 2025.  Arley Hof, MD  03/30/2024  12:28 PM

## 2024-07-12 LAB — COMPREHENSIVE METABOLIC PANEL WITH GFR: EGFR: 59

## 2024-07-14 ENCOUNTER — Other Ambulatory Visit: Payer: Self-pay | Admitting: *Deleted

## 2024-07-14 DIAGNOSIS — C20 Malignant neoplasm of rectum: Secondary | ICD-10-CM

## 2024-07-14 NOTE — Progress Notes (Signed)
 Dr. Cloretta reviewed CMP from Dr. Mat office. Needs a CMP at next visit. Order placed and scheduler notified.

## 2024-07-21 ENCOUNTER — Telehealth: Payer: Self-pay

## 2024-07-21 NOTE — Telephone Encounter (Signed)
 Patient called and request the provider to look at her lab from Dr. Dewitte. I placed the request on the provider desk.

## 2024-07-27 ENCOUNTER — Encounter: Payer: Self-pay | Admitting: Oncology

## 2024-07-28 ENCOUNTER — Encounter: Payer: Self-pay | Admitting: Oncology

## 2024-07-28 ENCOUNTER — Other Ambulatory Visit: Payer: Medicare (Managed Care) | Admitting: Oncology

## 2024-07-31 ENCOUNTER — Other Ambulatory Visit: Payer: Medicare (Managed Care)

## 2024-08-01 ENCOUNTER — Telehealth: Payer: Self-pay | Admitting: *Deleted

## 2024-08-01 NOTE — Telephone Encounter (Signed)
 Reports her MRI was canceled due to [facility] being out of network. She reports she has Medicaid. Was not aware she had a BCBS Medicare plan and this should be her primary plan and Medicaid is secondary per managed care. Provided Xochilt the information from the scanned card we have on her and she will call them to determine what facility she needs to use.

## 2024-08-02 ENCOUNTER — Telehealth: Payer: Self-pay | Admitting: *Deleted

## 2024-08-02 NOTE — Telephone Encounter (Signed)
 Insurance issue has been corrected per managed care=she only has Medicaid and MRI has been approved. Scheduled for [address] on 08/03/24 at 2:00/2:30 pm and NPO 4 hours prior. Patient notified. Will inquire of MD when to come for scan review. She prefers afternoon on week on 11/03.

## 2024-08-03 ENCOUNTER — Inpatient Hospital Stay: Payer: Medicare (Managed Care) | Admitting: Oncology

## 2024-08-03 ENCOUNTER — Inpatient Hospital Stay: Payer: Medicare (Managed Care)

## 2024-08-03 ENCOUNTER — Ambulatory Visit
Admission: RE | Admit: 2024-08-03 | Discharge: 2024-08-03 | Disposition: A | Discharge status: Home / Self Care | Source: Ambulatory Visit | Attending: Oncology | Admitting: Oncology

## 2024-08-03 DIAGNOSIS — C20 Malignant neoplasm of rectum: Secondary | ICD-10-CM

## 2024-08-03 MED ORDER — GADOPICLENOL 0.5 MMOL/ML IV SOLN
10.0000 mL | Freq: Once | INTRAVENOUS | Status: AC | PRN
  Administered 2024-08-03: 10 mL via INTRAVENOUS

## 2024-08-07 ENCOUNTER — Telehealth: Payer: Self-pay | Admitting: Oncology

## 2024-08-07 NOTE — Telephone Encounter (Signed)
 Left detailed on PT voicemail about Scan Review appt and to call back if time doesn't work.

## 2024-08-08 ENCOUNTER — Inpatient Hospital Stay: Admitting: Oncology

## 2024-08-10 ENCOUNTER — Inpatient Hospital Stay

## 2024-08-10 ENCOUNTER — Inpatient Hospital Stay: Attending: Oncology | Admitting: Oncology

## 2024-08-10 VITALS — BP 129/72 | HR 96 | Temp 98.4°F | Resp 18 | Ht 68.0 in | Wt 215.9 lb

## 2024-08-10 DIAGNOSIS — C20 Malignant neoplasm of rectum: Secondary | ICD-10-CM

## 2024-08-10 DIAGNOSIS — Z85038 Personal history of other malignant neoplasm of large intestine: Secondary | ICD-10-CM | POA: Insufficient documentation

## 2024-08-10 DIAGNOSIS — C787 Secondary malignant neoplasm of liver and intrahepatic bile duct: Secondary | ICD-10-CM

## 2024-08-10 NOTE — Progress Notes (Signed)
 Norma Colon   Diagnosis: Colon cancer  INTERVAL HISTORY:   Norma Colon for returns as scheduled.  She has chronic back pain, constipation, migraine headaches, and lower extremity neuropathy .  No new complaint.  She takes tramadol  for migraine headaches.  Objective:  Vital signs in last 24 hours:  Blood pressure 129/72, pulse 96, temperature 98.4 F (36.9 C), temperature source Temporal, resp. rate 18, height 5' 8 (1.727 m), weight 215 lb 14.4 oz (97.9 kg), SpO2 98%.  Lymphatics: No cervical, supraclavicular, axillary, or inguinal nodes Resp: Lungs clear bilaterally Cardio: Regular rate and rhythm GI: No mass, no hepatosplenomegaly, nontender Vascular: No leg edema   Lab Results:  Lab Results  Component Value Date   WBC 6.5 11/05/2021   HGB 13.7 11/05/2021   HCT 41.8 11/05/2021   MCV 93.1 11/05/2021   PLT 270.0 11/05/2021   NEUTROABS 2.6 08/26/2021    CMP  Lab Results  Component Value Date   NA 141 10/01/2023   K 4.0 10/01/2023   CL 103 10/01/2023   CO2 30 10/01/2023   GLUCOSE 89 10/01/2023   BUN 22 (H) 10/01/2023   CREATININE 1.07 (H) 10/01/2023   CALCIUM  9.8 10/01/2023   PROT 8.2 (H) 10/01/2023   ALBUMIN 4.3 10/01/2023   AST 21 10/01/2023   ALT 23 10/01/2023   ALKPHOS 101 10/01/2023   BILITOT 0.6 10/01/2023   GFRNONAA 59 (L) 10/01/2023   GFRAA >60 06/17/2020    Lab Results  Component Value Date   CEA1 6.85 (H) 06/17/2021   CEA 1.87 03/30/2024    Medications: I have reviewed the patient's current medications.   Assessment/Plan: Rectal cancer Colonoscopy 05/17/2018 revealed a partially obstructing mass beginning at 10 cm from the anal verge, biopsy confirmed invasive moderately differentiated adenocarcinoma CTs 05/24/2018-mass beginning at proximal he 7.6 cm from the anal verge, 20 perirectal lymph nodes, no evidence of metastatic disease Pelvic MRI 06/01/2018- tumor measured at 10.8 cm from the anal  sphincter, T3b, N1-2 left perirectal lymph nodes, each measuring 5 mm Radiation and Xeloda  initiated 06/13/2018, completed 07/20/2018 Low anterior resection 09/16/2018,ypT3,ypN1c, 1 cm tumor, partial response-score 2, 0/13 lymph nodes positive, 2 tumor deposits, MSI-stable, no loss of mismatch repair protein expression Cycle 1 CAPOX 10/17/2018 Cycle 2 CAPOX 11/07/2018 (oxaliplatin  dose reduced due to neutropenia) Cycle 3 CAPOX 11/28/2018 Cycle 4 CAPOX 12/19/2018 CTs 05/29/2019-no evidence of metastatic disease, severe right hydronephrosis, left thyroid  nodule CTs 04/11/2020-subtle nodular lesion in the inferior right liver suspicious for a new metastasis, status post right ureteral resection and psoas hitch/flap, resolution of hydronephrosis, sacral insufficiency fractures bilaterally MRI liver 05/08/2020-2.1 cm segment 6 lesion suspicious for a metastasis, 4 mm suspicious segment 3 lesion 4 mm segment 4B lesion consistent with a hemangioma, no abdominal lymphadenopathy, cholelithiasis Ablation of segment 6 and segment 3 lesions 06/19/2020, pathology from segment 6 biopsy confirmed metastatic adenocarcinoma-microsatellite stable, tumor mutation burden 3, K-ras wild type, NRAS wild-type, BRAF [id number] MRI abdomen 08/13/2020-ablation defects in segment 6 and segment 3, tiny foci of restricted diffusion with arterial phase enhancement at the junctions of segment 4A and segment 4B, and segment 5 concerning for metastatic disease CTs 09/09/2020-stable ablation sites at segment 6 and segment 2. Small focus of hyperenhancement measuring 7 mm in the inferior right liver segment 6 anterior to the ablation site, and in the anterior right liver segment 5/8-unchanged compared to MRI and concerning for metastatic disease, no other evidence of metastatic disease. Scattered irregular groundglass opacities in the posterior  right upper lobe Cycle 1 FOLFIRI/Avastin  09/23/2020 Treatment held 10/08/2020 due to neutropenia Cycle 2  FOLFIRI/Avastin  10/14/2020, Udenyca  Cycle 3 FOLFIRI/Avastin  10/30/2020, Udenyca  Cycle 4 FOLFIRI/Avastin  11/14/2020, Udenyca  Cycle 5 FOLFIRI/Avastin  11/28/2020, Udenyca  CTs 12/11/2020-no evidence of metastatic disease in the chest, stable liver ablation sites, stable hypervascular lesion in segment 4A, previously described enhancing lesion in the right hepatic lobe not well appreciated, no new lesions\ Real-time ultrasound evaluation by Dr. Karalee 12/18/2020-no lesions are visible sonographically, therefore not a candidate for ablation Upper quadrant ultrasound 02/25/2021-no new or progressive hepatic metastases, prior ablation defects noted CTs 06/17/2021-unchanged 1.5 cm mixed attenuation left hepatic lesion, stable heterogenous lesion in the posterior right liver, no evidence of new metastatic disease-for review revealed 1.7 cm nodular lesion at the superior margin of the ablation site in the right liver concerning for recurrent disease CEA mildly elevated 06/17/2021 Ultrasound-guided biopsy of lesion followed by ablation encompassing the entirety of the tumor in the right liver, segment 6,, repeat CT revealed adequate ablation of the region of tumor, 07/30/2021, biopsy adenocarcinoma consistent with metastatic rectal adenocarcinoma CEA normal 08/08/2021 CT renal stone study 08/21/2021-microwave ablation defect in the right liver CT renal stone study 08/24/2021-decrease size of right liver hypodensity at site of prior microwave ablation CTs 10/13/2021-stable appearance of ablation defect in the liver, no evidence of metastatic disease MRI abdomen 01/12/2022-previously ablated liver lesions without enhancement, 3 stable subcentimeter foci of enhancement-likely hemangiomas MRI abdomen 04/10/2022-stable appearance of previously ablated lesions in segment 3 and 6, no sign of progressive disease MRI abdomen 07/11/2022-stable appearance of previously treated segment 6 and 3 hepatic lesions, no evidence of recurrent  disease, no new lesion CTs 10/27/2022-decrease size of treated hepatic lesions, no evidence for residual disease, no evidence of progressive metastatic disease stable multinodular enlargement of the thyroid  MRI abdomen 01/27/2023-stable ablation defect in the posterior right liver segment 6 and lateral left liver segment 3, no new lesion, no evidence of local recurrence CTs 10/01/2023: Stable liver ablation sites, no evidence of recurrent disease MRI abdomen 01/23/2024: Slight interval contraction of the ablation sites, no evidence of recurrent or metastatic disease MRI abdomen 08/03/2024: Stable appearance of treated liver lesions, no residual or recurrent tumor seen, no new liver lesion History of Tobacco use Right UPJ stone on CT 05/24/2018 CT 11/21/2018-migration of previously noted right UPJ stone, new marked right hydroureteronephrosis Placement of a right JJ stent 11/21/2018 Laser lithotripsy and replacement of right JJ stent 12/12/2018 Followed by Dr. Sherrilee, urology Migraine headaches Restless legs Arm pain during the oxaliplatin  infusion and for 1 week after cycle 1.  The oxaliplatin  will be further diluted and infusion time extended getting with cycle 2. Neutropenia secondary to chemotherapy.  Oxaliplatin  dose reduced beginning with cycle 2. 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 Sherrilee, surgical reimplantation of the right ureter on 08/03/2019, right ureter stent removed 09/14/2019 Thyroid  nodules-new versus enlarged left thyroid  nodule CT 05/29/2019, thyroid  ultrasound 06/20/2019-multiple nodules, 1 right-sided nodule met criteria for ultrasound follow-up Ultrasound 05/08/2020-multinodular goiter, stable nodules in the isthmus and right thyroid  meeting criteria for 1 year follow-up, additional stable nodules do not meet  criteria for follow-up 12.  Right ureter injury following Foley catheter placement 07/30/2021-pyelogram confirmed extravasation of urine, placement of right ureter stent and Foley catheter 07/31/2021 Right ureter stent removed 09/04/2021 13.  Admission with urosepsis 08/24/2021  Disposition: Norma Colon is in clinical remission from colon cancer.  She is now 3 years out from the last liver ablation procedure.  There is no clinical or radiologic evidence of disease progression.  We will follow-up on the CEA from today.  She will return for an office visit and CEA in 4 months.  She would like to hold off on scheduling surveillance CT imaging for now.  Arley Hof, MD  08/10/2024  4:13 PM

## 2024-08-11 ENCOUNTER — Telehealth: Payer: Self-pay

## 2024-08-11 NOTE — Telephone Encounter (Signed)
 I contacted the laboratory to follow up regarding patient Cea. Alexis informed me that there is no available tube to perform the CEA test.

## 2024-08-14 ENCOUNTER — Other Ambulatory Visit: Payer: Self-pay | Admitting: *Deleted

## 2024-08-14 ENCOUNTER — Telehealth: Payer: Self-pay | Admitting: Oncology

## 2024-08-14 DIAGNOSIS — C20 Malignant neoplasm of rectum: Secondary | ICD-10-CM

## 2024-08-14 NOTE — Telephone Encounter (Signed)
Left message on PT voicemail.

## 2024-08-14 NOTE — Progress Notes (Signed)
 Informed Norma Colon the reason for re-draw of CEA and she agrees to come on 11/11 at 1:45 to recollect.

## 2024-08-15 ENCOUNTER — Inpatient Hospital Stay

## 2024-08-15 DIAGNOSIS — Z85038 Personal history of other malignant neoplasm of large intestine: Secondary | ICD-10-CM | POA: Diagnosis not present

## 2024-08-15 DIAGNOSIS — C20 Malignant neoplasm of rectum: Secondary | ICD-10-CM

## 2024-08-15 LAB — CEA (ACCESS): CEA (CHCC): 1.81 ng/mL (ref 0.00–5.00)

## 2024-10-28 ENCOUNTER — Emergency Department (HOSPITAL_COMMUNITY): Payer: No Typology Code available for payment source

## 2024-10-28 ENCOUNTER — Inpatient Hospital Stay (HOSPITAL_COMMUNITY)
Admission: EM | Admit: 2024-10-28 | Payer: No Typology Code available for payment source | Source: Home / Self Care | Attending: Internal Medicine | Admitting: Internal Medicine

## 2024-10-28 ENCOUNTER — Other Ambulatory Visit: Payer: Self-pay

## 2024-10-28 ENCOUNTER — Encounter (HOSPITAL_COMMUNITY): Payer: Self-pay | Admitting: *Deleted

## 2024-10-28 DIAGNOSIS — Z1152 Encounter for screening for COVID-19: Secondary | ICD-10-CM | POA: Diagnosis not present

## 2024-10-28 DIAGNOSIS — G8929 Other chronic pain: Secondary | ICD-10-CM | POA: Diagnosis present

## 2024-10-28 DIAGNOSIS — F419 Anxiety disorder, unspecified: Secondary | ICD-10-CM | POA: Diagnosis present

## 2024-10-28 DIAGNOSIS — G2581 Restless legs syndrome: Secondary | ICD-10-CM | POA: Diagnosis present

## 2024-10-28 DIAGNOSIS — Z85048 Personal history of other malignant neoplasm of rectum, rectosigmoid junction, and anus: Secondary | ICD-10-CM | POA: Diagnosis not present

## 2024-10-28 DIAGNOSIS — M419 Scoliosis, unspecified: Secondary | ICD-10-CM | POA: Diagnosis present

## 2024-10-28 DIAGNOSIS — Z823 Family history of stroke: Secondary | ICD-10-CM

## 2024-10-28 DIAGNOSIS — R509 Fever, unspecified: Secondary | ICD-10-CM | POA: Diagnosis not present

## 2024-10-28 DIAGNOSIS — M48061 Spinal stenosis, lumbar region without neurogenic claudication: Secondary | ICD-10-CM | POA: Diagnosis present

## 2024-10-28 DIAGNOSIS — F32A Depression, unspecified: Secondary | ICD-10-CM | POA: Diagnosis present

## 2024-10-28 DIAGNOSIS — A419 Sepsis, unspecified organism: Principal | ICD-10-CM

## 2024-10-28 DIAGNOSIS — Z87891 Personal history of nicotine dependence: Secondary | ICD-10-CM | POA: Diagnosis not present

## 2024-10-28 DIAGNOSIS — K08109 Complete loss of teeth, unspecified cause, unspecified class: Secondary | ICD-10-CM

## 2024-10-28 DIAGNOSIS — S3710XA Unspecified injury of ureter, initial encounter: Secondary | ICD-10-CM

## 2024-10-28 DIAGNOSIS — I4510 Unspecified right bundle-branch block: Secondary | ICD-10-CM | POA: Diagnosis present

## 2024-10-28 DIAGNOSIS — S3710XS Unspecified injury of ureter, sequela: Secondary | ICD-10-CM

## 2024-10-28 DIAGNOSIS — Z6832 Body mass index (BMI) 32.0-32.9, adult: Secondary | ICD-10-CM

## 2024-10-28 DIAGNOSIS — K8020 Calculus of gallbladder without cholecystitis without obstruction: Secondary | ICD-10-CM | POA: Diagnosis present

## 2024-10-28 DIAGNOSIS — K5900 Constipation, unspecified: Secondary | ICD-10-CM | POA: Diagnosis not present

## 2024-10-28 DIAGNOSIS — N390 Urinary tract infection, site not specified: Secondary | ICD-10-CM | POA: Diagnosis present

## 2024-10-28 DIAGNOSIS — M1611 Unilateral primary osteoarthritis, right hip: Secondary | ICD-10-CM | POA: Diagnosis present

## 2024-10-28 DIAGNOSIS — E872 Acidosis, unspecified: Secondary | ICD-10-CM | POA: Diagnosis present

## 2024-10-28 DIAGNOSIS — Z800 Family history of malignant neoplasm of digestive organs: Secondary | ICD-10-CM

## 2024-10-28 DIAGNOSIS — M4316 Spondylolisthesis, lumbar region: Secondary | ICD-10-CM | POA: Diagnosis present

## 2024-10-28 DIAGNOSIS — M545 Low back pain, unspecified: Secondary | ICD-10-CM | POA: Diagnosis not present

## 2024-10-28 DIAGNOSIS — N179 Acute kidney failure, unspecified: Secondary | ICD-10-CM | POA: Diagnosis present

## 2024-10-28 DIAGNOSIS — Z825 Family history of asthma and other chronic lower respiratory diseases: Secondary | ICD-10-CM | POA: Diagnosis not present

## 2024-10-28 DIAGNOSIS — E66811 Obesity, class 1: Secondary | ICD-10-CM | POA: Diagnosis present

## 2024-10-28 DIAGNOSIS — Z87442 Personal history of urinary calculi: Secondary | ICD-10-CM

## 2024-10-28 DIAGNOSIS — R10A0 Flank pain, unspecified side: Secondary | ICD-10-CM | POA: Diagnosis not present

## 2024-10-28 DIAGNOSIS — Z9221 Personal history of antineoplastic chemotherapy: Secondary | ICD-10-CM

## 2024-10-28 DIAGNOSIS — E860 Dehydration: Secondary | ICD-10-CM | POA: Diagnosis present

## 2024-10-28 DIAGNOSIS — Z888 Allergy status to other drugs, medicaments and biological substances status: Secondary | ICD-10-CM

## 2024-10-28 DIAGNOSIS — Z9889 Other specified postprocedural states: Secondary | ICD-10-CM

## 2024-10-28 DIAGNOSIS — Z8261 Family history of arthritis: Secondary | ICD-10-CM

## 2024-10-28 DIAGNOSIS — M4318 Spondylolisthesis, sacral and sacrococcygeal region: Secondary | ICD-10-CM | POA: Diagnosis present

## 2024-10-28 DIAGNOSIS — I82611 Acute embolism and thrombosis of superficial veins of right upper extremity: Secondary | ICD-10-CM | POA: Diagnosis not present

## 2024-10-28 DIAGNOSIS — R10A1 Flank pain, right side: Secondary | ICD-10-CM | POA: Diagnosis present

## 2024-10-28 DIAGNOSIS — Z885 Allergy status to narcotic agent status: Secondary | ICD-10-CM

## 2024-10-28 DIAGNOSIS — Z923 Personal history of irradiation: Secondary | ICD-10-CM

## 2024-10-28 DIAGNOSIS — Z8249 Family history of ischemic heart disease and other diseases of the circulatory system: Secondary | ICD-10-CM

## 2024-10-28 DIAGNOSIS — C787 Secondary malignant neoplasm of liver and intrahepatic bile duct: Secondary | ICD-10-CM | POA: Diagnosis not present

## 2024-10-28 DIAGNOSIS — Z79899 Other long term (current) drug therapy: Secondary | ICD-10-CM

## 2024-10-28 DIAGNOSIS — F413 Other mixed anxiety disorders: Secondary | ICD-10-CM | POA: Diagnosis not present

## 2024-10-28 LAB — CBC WITH DIFFERENTIAL/PLATELET
Abs Immature Granulocytes: 0.09 10*3/uL — ABNORMAL HIGH (ref 0.00–0.07)
Basophils Absolute: 0 10*3/uL (ref 0.0–0.1)
Basophils Relative: 0 %
Eosinophils Absolute: 0.1 10*3/uL (ref 0.0–0.5)
Eosinophils Relative: 1 %
HCT: 41.1 % (ref 36.0–46.0)
Hemoglobin: 13.4 g/dL (ref 12.0–15.0)
Immature Granulocytes: 1 %
Lymphocytes Relative: 10 %
Lymphs Abs: 1.2 10*3/uL (ref 0.7–4.0)
MCH: 30.3 pg (ref 26.0–34.0)
MCHC: 32.6 g/dL (ref 30.0–36.0)
MCV: 93 fL (ref 80.0–100.0)
Monocytes Absolute: 1.1 10*3/uL — ABNORMAL HIGH (ref 0.1–1.0)
Monocytes Relative: 9 %
Neutro Abs: 9.2 10*3/uL — ABNORMAL HIGH (ref 1.7–7.7)
Neutrophils Relative %: 79 %
Platelets: 206 10*3/uL (ref 150–400)
RBC: 4.42 MIL/uL (ref 3.87–5.11)
RDW: 13.2 % (ref 11.5–15.5)
WBC: 11.6 10*3/uL — ABNORMAL HIGH (ref 4.0–10.5)
nRBC: 0 % (ref 0.0–0.2)

## 2024-10-28 LAB — COMPREHENSIVE METABOLIC PANEL WITH GFR
ALT: 52 U/L — ABNORMAL HIGH (ref 0–44)
AST: 45 U/L — ABNORMAL HIGH (ref 15–41)
Albumin: 3.9 g/dL (ref 3.5–5.0)
Alkaline Phosphatase: 133 U/L — ABNORMAL HIGH (ref 38–126)
Anion gap: 14 (ref 5–15)
BUN: 25 mg/dL — ABNORMAL HIGH (ref 8–23)
CO2: 21 mmol/L — ABNORMAL LOW (ref 22–32)
Calcium: 9.3 mg/dL (ref 8.9–10.3)
Chloride: 102 mmol/L (ref 98–111)
Creatinine, Ser: 1.63 mg/dL — ABNORMAL HIGH (ref 0.44–1.00)
GFR, Estimated: 35 mL/min — ABNORMAL LOW
Glucose, Bld: 87 mg/dL (ref 70–99)
Potassium: 3.4 mmol/L — ABNORMAL LOW (ref 3.5–5.1)
Sodium: 137 mmol/L (ref 135–145)
Total Bilirubin: 1.7 mg/dL — ABNORMAL HIGH (ref 0.0–1.2)
Total Protein: 7.5 g/dL (ref 6.5–8.1)

## 2024-10-28 LAB — URINALYSIS, W/ REFLEX TO CULTURE (INFECTION SUSPECTED)
Bacteria, UA: NONE SEEN
Bilirubin Urine: NEGATIVE
Glucose, UA: NEGATIVE mg/dL
Ketones, ur: NEGATIVE mg/dL
Nitrite: NEGATIVE
Protein, ur: >=300 mg/dL — AB
Specific Gravity, Urine: >1.046 — ABNORMAL HIGH (ref 1.005–1.030)
WBC, UA: >50 WBC/hpf (ref 0–5)
pH: 5 (ref 5.0–8.0)

## 2024-10-28 LAB — I-STAT CHEM 8, ED
BUN: 32 mg/dL — ABNORMAL HIGH (ref 8–23)
Calcium, Ion: 1.1 mmol/L — ABNORMAL LOW (ref 1.15–1.40)
Chloride: 101 mmol/L (ref 98–111)
Creatinine, Ser: 1.8 mg/dL — ABNORMAL HIGH (ref 0.44–1.00)
Glucose, Bld: 84 mg/dL (ref 70–99)
HCT: 41 % (ref 36.0–46.0)
Hemoglobin: 13.9 g/dL (ref 12.0–15.0)
Potassium: 4 mmol/L (ref 3.5–5.1)
Sodium: 138 mmol/L (ref 135–145)
TCO2: 23 mmol/L (ref 22–32)

## 2024-10-28 LAB — I-STAT CG4 LACTIC ACID, ED
Lactic Acid, Venous: 2.6 mmol/L (ref 0.5–1.9)
Lactic Acid, Venous: 0.7 mmol/L (ref 0.5–1.9)

## 2024-10-28 LAB — RESP PANEL BY RT-PCR (RSV, FLU A&B, COVID)  RVPGX2
Influenza A by PCR: NEGATIVE
Influenza B by PCR: NEGATIVE
Resp Syncytial Virus by PCR: NEGATIVE
SARS Coronavirus 2 by RT PCR: NEGATIVE

## 2024-10-28 MED ORDER — SODIUM CHLORIDE 0.9 % IV SOLN
2.0000 g | Freq: Once | INTRAVENOUS | Status: AC
  Administered 2024-10-28: 2 g via INTRAVENOUS
  Filled 2024-10-28: qty 12.5

## 2024-10-28 MED ORDER — SODIUM CHLORIDE 0.9 % IV BOLUS
1000.0000 mL | Freq: Once | INTRAVENOUS | Status: AC
  Administered 2024-10-28: 1000 mL via INTRAVENOUS

## 2024-10-28 MED ORDER — MIDODRINE HCL 5 MG PO TABS
10.0000 mg | ORAL_TABLET | Freq: Once | ORAL | Status: AC
  Administered 2024-10-28: 10 mg via ORAL
  Filled 2024-10-28: qty 2

## 2024-10-28 MED ORDER — ACETAMINOPHEN 500 MG PO TABS
1000.0000 mg | ORAL_TABLET | Freq: Once | ORAL | Status: AC
  Administered 2024-10-28: 1000 mg via ORAL
  Filled 2024-10-28: qty 2

## 2024-10-28 MED ORDER — HYDROMORPHONE HCL 1 MG/ML IJ SOLN
0.5000 mg | INTRAMUSCULAR | Status: DC | PRN
  Administered 2024-10-29 – 2024-11-04 (×14): 0.5 mg via INTRAVENOUS
  Filled 2024-10-28 (×15): qty 0.5

## 2024-10-28 MED ORDER — VANCOMYCIN HCL IN DEXTROSE 1-5 GM/200ML-% IV SOLN
1000.0000 mg | Freq: Once | INTRAVENOUS | Status: AC
  Administered 2024-10-28: 1000 mg via INTRAVENOUS
  Filled 2024-10-28: qty 200

## 2024-10-28 MED ORDER — METRONIDAZOLE 500 MG/100ML IV SOLN
500.0000 mg | Freq: Once | INTRAVENOUS | Status: AC
  Administered 2024-10-28: 500 mg via INTRAVENOUS
  Filled 2024-10-28: qty 100

## 2024-10-28 MED ORDER — HYDROMORPHONE HCL 1 MG/ML IJ SOLN
0.5000 mg | Freq: Once | INTRAMUSCULAR | Status: AC
  Administered 2024-10-28: 0.5 mg via INTRAVENOUS
  Filled 2024-10-28: qty 1

## 2024-10-28 MED ORDER — OXYCODONE HCL 5 MG PO TABS
5.0000 mg | ORAL_TABLET | ORAL | Status: AC | PRN
  Administered 2024-10-30 – 2024-11-03 (×6): 10 mg via ORAL
  Filled 2024-10-28 (×6): qty 2

## 2024-10-28 MED ORDER — LACTATED RINGERS IV SOLN: INTRAVENOUS | Status: AC

## 2024-10-28 MED ORDER — IOHEXOL 300 MG/ML  SOLN
80.0000 mL | Freq: Once | INTRAMUSCULAR | Status: AC | PRN
  Administered 2024-10-28: 75 mL via INTRAVENOUS

## 2024-10-28 MED ORDER — LACTATED RINGERS IV BOLUS (SEPSIS)
1000.0000 mL | Freq: Once | INTRAVENOUS | Status: AC
  Administered 2024-10-28: 1000 mL via INTRAVENOUS

## 2024-10-28 NOTE — ED Triage Notes (Signed)
 Here by POV from home for fever, chills, and R flank pain. Onset yesterday. Highest temp 102.5 PTA. Took Ibuprofen at 1800. Endorses recent uterus ablation, and full dental extraction last Monday. Taking meds as prescribed. Rates pain 7/10. Denies NVD, or other sx.

## 2024-10-28 NOTE — ED Notes (Signed)
 Patient transported to CT

## 2024-10-28 NOTE — ED Provider Notes (Signed)
 " [place] EMERGENCY DEPARTMENT AT [facility] Provider Note   CSN: [id number] Arrival date & time: 10/28/24  1818     Patient presents with: Fever   Norma Colon is a 64 y.o. female.   HPI 64 year old female presents with fever, chills, flank and abdominal pain.  Started off having symptoms about a week ago.  A week ago she had all of her teeth removed and was put on antibiotics and given Tylenol  and ibuprofen.  That has done well however starting yesterday she developed fever, chills, right flank pain and abdominal pain in her lower abdomen.  She has chronic back pain but this feels more like her kidney, which she has had problems with before.  Temp up to 102.  Is having shaking chills.  Started having some dysuria today.  Pain is severe.  She took some ibuprofen earlier today but has not taken any Tylenol . No leg weakness/numbness.  Prior to Admission medications  Medication Sig Start Date End Date Taking? Authorizing Provider  alprazolam  (XANAX ) 2 MG tablet Take 2 mg by mouth every morning.     [provider]  buPROPion  (WELLBUTRIN  XL) 300 MG 24 hr tablet Take 300 mg by mouth every morning.     [provider]  cyclobenzaprine  (FLEXERIL ) 10 MG tablet Take 20 mg by mouth at bedtime.    [provider]  rOPINIRole  (REQUIP ) 1 MG tablet Take 1 mg by mouth at bedtime.    [provider]  traMADol  (ULTRAM ) 50 MG tablet Take 50 mg by mouth every 6 (six) hours as needed. 12/03/22   [provider]    Allergies: Prednisone and Codeine    Review of Systems  Constitutional:  Positive for chills and fever.  Respiratory:  Negative for cough.   Gastrointestinal:  Positive for abdominal pain.  Genitourinary:  Positive for dysuria and flank pain.    Updated Vital Signs BP (!) 95/59   Pulse 71   Temp 98.6 F (37 C) (Tympanic)   Resp 15   Ht 5' 8 (1.727 m)   Wt 96.2 kg   LMP  (LMP Unknown)   SpO2 (!) 89%   BMI 32.23 kg/m    Physical Exam Vitals and nursing note reviewed.  Constitutional:      Appearance: She is well-developed. She is not diaphoretic.  HENT:     Head: Normocephalic and atraumatic.  Cardiovascular:     Rate and Rhythm: Regular rhythm. Tachycardia present.     Heart sounds: Normal heart sounds.  Pulmonary:     Effort: Pulmonary effort is normal.     Breath sounds: Normal breath sounds.  Abdominal:     Palpations: Abdomen is soft.     Tenderness: There is generalized abdominal tenderness. There is right CVA tenderness. There is no left CVA tenderness.  Musculoskeletal:     Cervical back: No rigidity.  Skin:    General: Skin is warm and dry.  Neurological:     Mental Status: She is alert.     (all labs ordered are listed, but only abnormal results are displayed) Labs Reviewed  COMPREHENSIVE METABOLIC PANEL WITH GFR - Abnormal; Notable for the following components:      Result Value   Potassium 3.4 (*)    CO2 21 (*)    BUN 25 (*)    Creatinine, Ser 1.63 (*)    AST 45 (*)    ALT 52 (*)    Alkaline Phosphatase 133 (*)  Total Bilirubin 1.7 (*)    GFR, Estimated 35 (*)    All other components within normal limits  CBC WITH DIFFERENTIAL/PLATELET - Abnormal; Notable for the following components:   WBC 11.6 (*)    Neutro Abs 9.2 (*)    Monocytes Absolute 1.1 (*)    Abs Immature Granulocytes 0.09 (*)    All other components within normal limits  I-STAT CG4 LACTIC ACID, ED - Abnormal; Notable for the following components:   Lactic Acid, Venous 2.6 (*)    All other components within normal limits  I-STAT CHEM 8, ED - Abnormal; Notable for the following components:   BUN 32 (*)    Creatinine, Ser 1.80 (*)    Calcium , Ion 1.10 (*)    All other components within normal limits  RESP PANEL BY RT-PCR (RSV, FLU A&B, COVID)  RVPGX2  CULTURE, BLOOD (ROUTINE X 2)  CULTURE, BLOOD (ROUTINE X 2)  PROTIME-INR  URINALYSIS, W/ REFLEX TO CULTURE (INFECTION SUSPECTED)  I-STAT CG4 LACTIC  ACID, ED    EKG: EKG Interpretation Date/Time:  Saturday October 28 2024 20:33:26 EST Ventricular Rate:  82 PR Interval:  109 QRS Duration:  115 QT Interval:  330 QTC Calculation: 386 R Axis:   31  Text Interpretation: Sinus rhythm Short PR interval Incomplete right bundle branch block Low voltage, precordial leads Confirmed by Freddi Hamilton [phone]) on 10/28/2024 10:18:29 PM  Radiology: CT ABDOMEN PELVIS W CONTRAST Result Date: 10/28/2024 EXAM: CT ABDOMEN AND PELVIS WITH CONTRAST 10/28/2024 08:22:40 PM TECHNIQUE: CT of the abdomen and pelvis was performed with the administration of 75 mL of iohexol  (OMNIPAQUE ) 300 MG/ML solution. Multiplanar reformatted images are provided for review. Automated exposure control, iterative reconstruction, and/or weight-based adjustment of the mA/kV was utilized to reduce the radiation dose to as low as reasonably achievable. COMPARISON: MRI abdomen 08/03/2024 and 01/23/2024. CT chest abdomen and pelvis 10/01/2023. CLINICAL HISTORY: Right flank pain, sepsis. Fever, chills, and right flank pain since yesterday. FINDINGS: LOWER CHEST: No acute abnormality. Lung bases are clear. LIVER: Diffuse fatty infiltration of the liver. Focal liver lesions including a heterogeneous hypodense lesion in segment 6 measuring 2.4 cm in diameter and a heterogeneous hypodense lesion in segment 2 measuring 1.3 cm diameter. No change in appearance with prior study. These lesions are consistent with known treated metastases. No new liver lesions are identified. GALLBLADDER AND BILE DUCTS: Cholelithiasis. No inflammatory stranding. No bile duct dilatation. SPLEEN: The spleen is unremarkable. PANCREAS: Pancreas is unremarkable. ADRENAL GLANDS: Adrenal glands are unremarkable. KIDNEYS, URETERS AND BLADDER: Kidneys, ureters, and the bladder are normal. GI AND BOWEL: Stool-filled colon. Stomach and small bowel are not abnormally distended. No wall thickening or inflammatory stranding. Appendix  is normal. Surgical anastomosis in the sigmoid colon. PERITONEUM AND RETROPERITONEUM: No free air or free fluid in the abdomen. No loculated abscess collection. VASCULATURE: Normal caliber abdominal aorta. Minimal aortic calcification. LYMPH NODES: No retroperitoneal lymphadenopathy. REPRODUCTIVE ORGANS: The uterus is not enlarged. No abnormal adnexal masses. BONES AND SOFT TISSUES: Degenerative changes in the spine and hips. 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 No focal soft tissue abnormality. IMPRESSION: 1. No acute abnormality in the abdomen or pelvis. No abscess or obstructive uropathy. 2. 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 No new liver lesions. 3. Cholelithiasis Electronically signed by: Elsie Gravely MD 10/28/2024 08:31 PM EST RP Workstation: [id number]   DG Chest Port 1 View Result Date: 10/28/2024 EXAM: 1 VIEW(S) XRAY OF THE CHEST 10/28/2024 07:07:00 PM COMPARISON: None  available. CLINICAL HISTORY: Fever. FINDINGS: LUNGS AND PLEURA: No focal pulmonary opacity. No pleural effusion. No pneumothorax. HEART AND MEDIASTINUM: No acute abnormality of the cardiac and mediastinal silhouettes. BONES AND SOFT TISSUES: No acute osseous abnormality. IMPRESSION: 1. No acute process. Electronically signed by: Dorethia Molt MD 10/28/2024 07:16 PM EST RP Workstation: [id number]     .Critical Care  Performed by: Freddi Hamilton, MD Authorized by: Freddi Hamilton, MD   Critical care provider statement:    Critical care time (minutes):  50   Critical care time was exclusive of:  Separately billable procedures and treating other patients   Critical care was necessary to treat or prevent imminent or life-threatening deterioration of the following conditions:  Sepsis   Critical care was time spent personally by me on the following activities:  Development of treatment plan with patient or surrogate, discussions with consultants, evaluation of patient's response to  treatment, examination of patient, ordering and review of laboratory studies, ordering and review of radiographic studies, ordering and performing treatments and interventions, pulse oximetry, re-evaluation of patient's condition and review of old charts    Medications Ordered in the ED  lactated ringers  infusion ( Intravenous New Bag/Given 10/28/24 2219)  acetaminophen  (TYLENOL ) tablet 1,000 mg (1,000 mg Oral Given 10/28/24 1927)  HYDROmorphone  (DILAUDID ) injection 0.5 mg (0.5 mg Intravenous Given 10/28/24 1928)  ceFEPIme  (MAXIPIME ) 2 g in sodium chloride  0.9 % 100 mL IVPB (0 g Intravenous Stopped 10/28/24 2026)  metroNIDAZOLE  (FLAGYL ) IVPB 500 mg (0 mg Intravenous Stopped 10/28/24 2059)  vancomycin  (VANCOCIN ) IVPB 1000 mg/200 mL premix (0 mg Intravenous Stopped 10/28/24 2150)  iohexol  (OMNIPAQUE ) 300 MG/ML solution 80 mL (75 mLs Intravenous Contrast Given 10/28/24 2016)  sodium chloride  0.9 % bolus 1,000 mL (0 mLs Intravenous Stopped 10/28/24 2220)  lactated ringers  bolus 1,000 mL (1,000 mLs Intravenous New Bag/Given 10/28/24 2154)                                    Medical Decision Making Amount and/or Complexity of Data Reviewed External Data Reviewed: notes. Labs: ordered.    Details: Elevated lactate. AKI Radiology: ordered and independent interpretation performed.    Details: No ureteral stone ECG/medicine tests: ordered and independent interpretation performed.    Details: RBBB  Risk OTC drugs. Prescription drug management. Decision regarding hospitalization.   Patient presents with signs of infection.  Fever at home and low-grade temp here.  Given pain control and fluids and started on IV antibiotics.  Initial lactate 2.6 but then cleared.  Her blood pressure did start to downtrend and at times got into the 70s.  However it has come up with further fluids and she has been given 30 cc/KG of fluids and will start on IV fluids at 150 cc/h.  Probably this is a urinary source based on  her history and symptoms.  She has had normal mental status on multiple reevaluations.  She is feeling a lot better despite her softer blood pressures.  I think she is stable for a hospitalist admission at this time. Discussed with Dr. Shona.     Final diagnoses:  Septic shock (HCC)  Acute kidney injury    ED Discharge Orders     None          Freddi Hamilton, MD 10/28/24 2250  "

## 2024-10-28 NOTE — Sepsis Progress Note (Signed)
 Elink following for sepsis protocol.

## 2024-10-29 DIAGNOSIS — R10A1 Flank pain, right side: Secondary | ICD-10-CM | POA: Diagnosis not present

## 2024-10-29 LAB — COMPREHENSIVE METABOLIC PANEL WITH GFR
ALT: 40 U/L (ref 0–44)
AST: 32 U/L (ref 15–41)
Albumin: 3.1 g/dL — ABNORMAL LOW (ref 3.5–5.0)
Alkaline Phosphatase: 105 U/L (ref 38–126)
Anion gap: 10 (ref 5–15)
BUN: 23 mg/dL (ref 8–23)
CO2: 21 mmol/L — ABNORMAL LOW (ref 22–32)
Calcium: 8.2 mg/dL — ABNORMAL LOW (ref 8.9–10.3)
Chloride: 106 mmol/L (ref 98–111)
Creatinine, Ser: 1.53 mg/dL — ABNORMAL HIGH (ref 0.44–1.00)
GFR, Estimated: 38 mL/min — ABNORMAL LOW
Glucose, Bld: 81 mg/dL (ref 70–99)
Potassium: 4.1 mmol/L (ref 3.5–5.1)
Sodium: 137 mmol/L (ref 135–145)
Total Bilirubin: 1.2 mg/dL (ref 0.0–1.2)
Total Protein: 5.9 g/dL — ABNORMAL LOW (ref 6.5–8.1)

## 2024-10-29 LAB — CBC WITH DIFFERENTIAL/PLATELET
Abs Immature Granulocytes: 0.05 10*3/uL (ref 0.00–0.07)
Basophils Absolute: 0 10*3/uL (ref 0.0–0.1)
Basophils Relative: 0 %
Eosinophils Absolute: 0.1 10*3/uL (ref 0.0–0.5)
Eosinophils Relative: 1 %
HCT: 33.9 % — ABNORMAL LOW (ref 36.0–46.0)
Hemoglobin: 11.4 g/dL — ABNORMAL LOW (ref 12.0–15.0)
Immature Granulocytes: 1 %
Lymphocytes Relative: 10 %
Lymphs Abs: 1.1 10*3/uL (ref 0.7–4.0)
MCH: 31.5 pg (ref 26.0–34.0)
MCHC: 33.6 g/dL (ref 30.0–36.0)
MCV: 93.6 fL (ref 80.0–100.0)
Monocytes Absolute: 0.5 10*3/uL (ref 0.1–1.0)
Monocytes Relative: 4 %
Neutro Abs: 8.5 10*3/uL — ABNORMAL HIGH (ref 1.7–7.7)
Neutrophils Relative %: 84 %
Platelets: 159 10*3/uL (ref 150–400)
RBC: 3.62 MIL/uL — ABNORMAL LOW (ref 3.87–5.11)
RDW: 13.4 % (ref 11.5–15.5)
WBC: 10.2 10*3/uL (ref 4.0–10.5)
nRBC: 0 % (ref 0.0–0.2)

## 2024-10-29 LAB — PHOSPHORUS: Phosphorus: 4.2 mg/dL (ref 2.5–4.6)

## 2024-10-29 LAB — PROTIME-INR
INR: 1.5 — ABNORMAL HIGH (ref 0.8–1.2)
Prothrombin Time: 18.5 s — ABNORMAL HIGH (ref 11.4–15.2)

## 2024-10-29 LAB — MAGNESIUM: Magnesium: 2 mg/dL (ref 1.7–2.4)

## 2024-10-29 MED ORDER — CYCLOBENZAPRINE HCL 5 MG PO TABS
5.0000 mg | ORAL_TABLET | Freq: Three times a day (TID) | ORAL | Status: DC | PRN
  Administered 2024-10-29 – 2024-11-04 (×11): 5 mg via ORAL
  Filled 2024-10-29 (×12): qty 1

## 2024-10-29 MED ORDER — ROPINIROLE HCL 0.25 MG PO TABS
0.2500 mg | ORAL_TABLET | Freq: Every day | ORAL | Status: DC
  Administered 2024-10-29 – 2024-11-04 (×8): 0.25 mg via ORAL
  Filled 2024-10-29 (×8): qty 1

## 2024-10-29 MED ORDER — PROCHLORPERAZINE EDISYLATE 10 MG/2ML IJ SOLN: 5.0000 mg | Freq: Four times a day (QID) | INTRAMUSCULAR | Status: DC | PRN

## 2024-10-29 MED ORDER — SODIUM CHLORIDE 0.9 % IV SOLN
2.0000 g | Freq: Two times a day (BID) | INTRAVENOUS | Status: DC
  Administered 2024-10-29 – 2024-10-31 (×6): 2 g via INTRAVENOUS
  Filled 2024-10-29 (×6): qty 12.5

## 2024-10-29 MED ORDER — MELATONIN 5 MG PO TABS
5.0000 mg | ORAL_TABLET | Freq: Every evening | ORAL | Status: DC | PRN
  Administered 2024-10-29 – 2024-11-05 (×5): 5 mg via ORAL
  Filled 2024-10-29 (×6): qty 1

## 2024-10-29 MED ORDER — CHLORHEXIDINE GLUCONATE 0.12 % MT SOLN
15.0000 mL | Freq: Two times a day (BID) | OROMUCOSAL | Status: DC
  Administered 2024-10-29 – 2024-11-04 (×6): 15 mL via OROMUCOSAL
  Filled 2024-10-29 (×5): qty 15

## 2024-10-29 MED ORDER — ALPRAZOLAM 0.5 MG PO TABS
2.0000 mg | ORAL_TABLET | Freq: Every morning | ORAL | Status: DC
  Administered 2024-10-29 – 2024-11-05 (×8): 2 mg via ORAL
  Filled 2024-10-29 (×8): qty 4

## 2024-10-29 MED ORDER — BUPROPION HCL ER (XL) 300 MG PO TB24
300.0000 mg | ORAL_TABLET | Freq: Every morning | ORAL | Status: DC
  Administered 2024-10-29 – 2024-11-05 (×8): 300 mg via ORAL
  Filled 2024-10-29 (×8): qty 1

## 2024-10-29 MED ORDER — POLYETHYLENE GLYCOL 3350 17 G PO PACK: 17.0000 g | PACK | Freq: Every day | ORAL | Status: DC | PRN

## 2024-10-29 MED ORDER — ACETAMINOPHEN 500 MG PO TABS
500.0000 mg | ORAL_TABLET | Freq: Four times a day (QID) | ORAL | Status: AC | PRN
  Administered 2024-10-29 – 2024-10-31 (×4): 500 mg via ORAL
  Filled 2024-10-29 (×4): qty 1

## 2024-10-29 MED ORDER — ENOXAPARIN SODIUM 40 MG/0.4ML IJ SOSY
40.0000 mg | PREFILLED_SYRINGE | INTRAMUSCULAR | Status: DC
  Administered 2024-10-29 – 2024-11-05 (×8): 40 mg via SUBCUTANEOUS
  Filled 2024-10-29 (×8): qty 0.4

## 2024-10-29 NOTE — Plan of Care (Signed)

## 2024-10-29 NOTE — H&P (Addendum)
 " History and Physical  Norma Colon FMW:[id number] DOB: [birth year] DOA: 10/28/2024  Referring physician: Dr. Freddi, EDP  PCP: Mat Browning, MD  Outpatient Specialists: Medical oncology, urology. Patient coming from: Home.  Chief Complaint: Right flank pain and shaking chills.  HPI: Norma Colon is a 64 y.o. 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  Associated with a high-grade fever of 102.5 prior to arrival to the ER this evening, and dysuria.  Took ibuprofen.  Denies any nausea, vomiting, or constipation.  The patient presented to the ER for further evaluation.    In the ER, with low-grade fever 99.2.  Tachypneic 24.  UA positive for pyuria.  The patient received broad-spectrum IV antibiotics, cefepime , IV vancomycin , and IV Flagyl .  [facility], hospitalist service, was asked to admit.  ED Course: Cefepime , IV Flagyl , and IV vancomycin .  Review of Systems: Review of systems as noted in the HPI. All other systems reviewed and are negative.   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[facility]) oncologist-- dr cloretta--  per last note in epic ,  clinical remission   dx 08/ 2019---- chemoradiation concurrent completed 07-20-2018;  s/p  low anterior resection 09-16-2018;   chemo 10-17-2018  ot 12-19-2018   Anxiety    Arthritis    Chemotherapy induced neutropenia    Depression    Headache    migraines    History of HPV infection    History of kidney stones    Hydronephrosis, right    Restless leg syndrome    Scoliosis    Sepsis (HCC)    11-2018   Past Surgical  History:  Procedure Laterality Date   COLONOSCOPY     COLPOSCOPY  04/04/2018   CYSTOSCOPY W/ RETROGRADES Right 07/31/2021   Procedure: CYSTOSCOPY WITH RETROGRADE PYELOGRAM, URETEROSCOPY AND STENT PLACEMENT;  Surgeon: Selma Donnice SAUNDERS, MD;  Location: [facility] OR;  Service: Urology;  Laterality: Right;   CYSTOSCOPY W/ URETERAL STENT PLACEMENT Right 09/04/2021   Procedure: CYSTOSCOPY WITH RETROGRADE PYELOGRAM WITH STENT REMOVAL;  Surgeon: Sherrilee Belvie CROME, MD;  Location: [facility];  Service: Urology;  Laterality: Right;   CYSTOSCOPY WITH RETROGRADE PYELOGRAM, URETEROSCOPY AND STENT PLACEMENT Right 12/12/2018   Procedure: CYSTOSCOPY WITH RETROGRADE PYELOGRAM, URETEROSCOPY, STONE EXTRACTION AND STENT PLACEMENT;  Surgeon: Sherrilee Belvie CROME, MD;  Location: [facility];  Service: Urology;  Laterality: Right;   CYSTOSCOPY WITH RETROGRADE PYELOGRAM, URETEROSCOPY AND STENT PLACEMENT Right 04/17/2019   Procedure: CYSTOSCOPY WITH RETROGRADE PYELOGRAM, BALLOON DILITATION,DIAGNOSTIC  URETEROSCOPY AND STENT PLACEMENT;  Surgeon: Sherrilee Belvie CROME, MD;  Location: [facility];  Service: Urology;  Laterality: Right;   CYSTOSCOPY WITH STENT PLACEMENT Right 11/21/2018   Procedure: CYSTOSCOPY, RIGHT RETROGRADE PYELOGRAM, WITH RIGHT URETERAL STENT PLACEMENT;  Surgeon: Sherrilee Belvie CROME, MD;  Location: [facility];  Service: Urology;  Laterality: Right;   IR IMAGING GUIDED PORT INSERTION  09/04/2020   IR NEPHROSTOMY PLACEMENT RIGHT  06/22/2019   IR PATIENT EVAL TECH 0-60 MINS  06/28/2019   IR RADIOLOGIST EVAL & MGMT  06/06/2020   IR RADIOLOGIST EVAL & MGMT  07/17/2020   IR RADIOLOGIST EVAL & MGMT  08/15/2020  IR RADIOLOGIST EVAL & MGMT  09/03/2020   IR RADIOLOGIST EVAL & MGMT  12/18/2020   IR RADIOLOGIST EVAL & MGMT  02/25/2021   IR RADIOLOGIST EVAL & MGMT  06/23/2021   IR RADIOLOGIST EVAL & MGMT  08/12/2021   IR RADIOLOGIST EVAL & MGMT  10/15/2021   IR RADIOLOGIST EVAL & MGMT  01/13/2022   IR  RADIOLOGIST EVAL & MGMT  04/14/2022   IR RADIOLOGIST EVAL & MGMT  07/23/2022   IR RADIOLOGIST EVAL & MGMT  02/09/2023   IR RADIOLOGIST EVAL & MGMT  10/21/2023   IR RADIOLOGIST EVAL & MGMT  12/16/2023   IR RADIOLOGIST EVAL & MGMT  01/27/2024   IR REMOVAL TUN ACCESS W/ PORT W/O FL MOD SED  01/29/2022   left forearm surgery due to calcium  rock      RADIOLOGY WITH ANESTHESIA N/A 06/19/2020   Procedure: CT WITH ANESTHESIA MICROWAVE ABLATION;  Surgeon: Karalee Beat, MD;  Location: [facility];  Service: Anesthesiology;  Laterality: N/A;   RADIOLOGY WITH ANESTHESIA N/A 07/30/2021   Procedure: CT MICROWAVE ABLATION OF THE LIVER;  Surgeon: Karalee Beat POUR, MD;  Location: [facility];  Service: Radiology;  Laterality: N/A;   ROBOT ASSISTED PYELOPLASTY Right 08/03/2019   Procedure: XI ROBOTIC ASSISTED PYELOPLASTY WITH STENT PLACEMENT;  Surgeon: Sherrilee Belvie CROME, MD;  Location: [facility];  Service: Urology;  Laterality: Right;  3 HRS   SHOULDER ARTHROSCOPY W/ ROTATOR CUFF REPAIR Right    XI ROBOTIC ASSISTED LOWER ANTERIOR RESECTION N/A 09/16/2018   Procedure: XI ROBOTIC ASSISTED LOWER ANTERIOR RESECTION ERAS PATHWAY;  Surgeon: Debby Hila, MD;  Location: [facility];  Service: General;  Laterality: N/A;    Social History:  reports that she quit smoking about 6 years ago. Her smoking use included cigarettes. She started smoking about 36 years ago. She has a 30 pack-year smoking history. She has never used smokeless tobacco. She reports current alcohol use. She reports that she does not use drugs.   Allergies[1]  Family History  Problem Relation Age of Onset   COPD Father        smoker   Stomach cancer Father        spread to lungs and bones   Arthritis Mother    Heart attack Paternal Grandmother    Heart attack Paternal Grandfather    Heart attack Maternal Grandmother    Stroke Maternal Grandmother    Colon cancer Neg Hx    Rectal cancer Neg Hx    Esophageal cancer Neg Hx       Prior to Admission  medications  Medication Sig Start Date End Date Taking? Authorizing Provider  acetaminophen -codeine (TYLENOL  #3) 300-30 MG tablet Take 1 tablet by mouth every 4 (four) hours as needed. for pain   Yes [provider]  alprazolam  (XANAX ) 2 MG tablet Take 2 mg by mouth every morning.    Yes [provider]  buPROPion  (WELLBUTRIN  XL) 300 MG 24 hr tablet Take 300 mg by mouth every morning.    Yes [provider]  chlorhexidine  (PERIDEX ) 0.12 % solution Use as directed 15 mLs in the mouth or throat in the morning, at noon, and at bedtime. 10/22/24  Yes [provider]  cyclobenzaprine  (FLEXERIL ) 10 MG tablet Take 20 mg by mouth at bedtime.   Yes [provider]  ibuprofen (ADVIL) 400 MG tablet TAKE 1 TABLET BY MOUTH FOUR TIMES DAILY WITH FOOD   Yes [provider]  rOPINIRole  (REQUIP ) 0.25 MG tablet Take  0.25 mg by mouth at bedtime. 10/24/24  Yes [provider]  traMADol  (ULTRAM ) 50 MG tablet Take 50 mg by mouth every 6 (six) hours as needed. 12/03/22  Yes [provider]  clindamycin (CLEOCIN) 300 MG capsule TAKE 1 CAPSULE BY MOUTH THREE TIMES DAILY WITH GLASS OF WATER  Patient not taking: Reported on 10/28/2024    [provider]  dexamethasone  (DECADRON ) 2 MG tablet Take 2 mg by mouth 3 (three) times daily. Patient not taking: Reported on 10/28/2024    [provider]  rOPINIRole  (REQUIP ) 1 MG tablet Take 1 mg by mouth at bedtime. Patient not taking: Reported on 10/28/2024    [provider]    Physical Exam: BP 104/67 (BP Location: Right Arm)   Pulse 67   Temp 98.2 F (36.8 C) (Oral)   Resp 20   Ht 5' 8 (1.727 m)   Wt 96.2 kg   LMP  (LMP Unknown)   SpO2 98%   BMI 32.23 kg/m   General: 64 y.o. year-old female well developed well nourished in no acute distress.  Alert and oriented x3. Cardiovascular: Regular rate and rhythm with no rubs or gallops.  No thyromegaly or JVD noted.  No lower  extremity edema. 2/4 pulses in all 4 extremities. Respiratory: Clear to auscultation with no wheezes or rales. Good inspiratory effort. Abdomen: Soft right flank tenderness.  Nondistended with normal bowel sounds x4 quadrants. Muskuloskeletal: No cyanosis, clubbing or edema noted bilaterally Neuro: CN II-XII intact, strength, sensation, reflexes Skin: No ulcerative lesions noted or rashes Psychiatry: Judgement and insight appear normal. Mood is appropriate for condition and setting          Labs on Admission:  Basic Metabolic Panel: Recent Labs  Lab 10/28/24 1923 10/28/24 1941  NA 137 138  K 3.4* 4.0  CL 102 101  CO2 21*  --   GLUCOSE 87 84  BUN 25* 32*  CREATININE 1.63* 1.80*  CALCIUM  9.3  --    Liver Function Tests: Recent Labs  Lab 10/28/24 1923  AST 45*  ALT 52*  ALKPHOS 133*  BILITOT 1.7*  PROT 7.5  ALBUMIN 3.9   No results for input(s): LIPASE, AMYLASE in the last 168 hours. No results for input(s): AMMONIA in the last 168 hours. CBC: Recent Labs  Lab 10/28/24 1923 10/28/24 1941  WBC 11.6*  --   NEUTROABS 9.2*  --   HGB 13.4 13.9  HCT 41.1 41.0  MCV 93.0  --   PLT 206  --    Cardiac Enzymes: No results for input(s): CKTOTAL, CKMB, CKMBINDEX, TROPONINI in the last 168 hours.  BNP (last 3 results) No results for input(s): BNP in the last 8760 hours.  ProBNP (last 3 results) No results for input(s): PROBNP in the last 8760 hours.  CBG: No results for input(s): GLUCAP in the last 168 hours.  Radiological Exams on Admission: CT ABDOMEN PELVIS W CONTRAST Result Date: 10/28/2024 EXAM: CT ABDOMEN AND PELVIS WITH CONTRAST 10/28/2024 08:22:40 PM TECHNIQUE: CT of the abdomen and pelvis was performed with the administration of 75 mL of iohexol  (OMNIPAQUE ) 300 MG/ML solution. Multiplanar reformatted images are provided for review. Automated exposure control, iterative reconstruction, and/or weight-based adjustment of the mA/kV was utilized  to reduce the radiation dose to as low as reasonably achievable. COMPARISON: MRI abdomen 08/03/2024 and 01/23/2024. CT chest abdomen and pelvis 10/01/2023. CLINICAL HISTORY: Right flank pain, sepsis. Fever, chills, and right flank pain since yesterday. FINDINGS: LOWER CHEST: No acute abnormality. Lung  bases are clear. LIVER: Diffuse fatty infiltration of the liver. Focal liver lesions including a heterogeneous hypodense lesion in segment 6 measuring 2.4 cm in diameter and a heterogeneous hypodense lesion in segment 2 measuring 1.3 cm diameter. No change in appearance with prior study. These lesions are consistent with known treated metastases. No new liver lesions are identified. GALLBLADDER AND BILE DUCTS: Cholelithiasis. No inflammatory stranding. No bile duct dilatation. SPLEEN: The spleen is unremarkable. PANCREAS: Pancreas is unremarkable. ADRENAL GLANDS: Adrenal glands are unremarkable. KIDNEYS, URETERS AND BLADDER: Kidneys, ureters, and the bladder are normal. GI AND BOWEL: Stool-filled colon. Stomach and small bowel are not abnormally distended. No wall thickening or inflammatory stranding. Appendix is normal. Surgical anastomosis in the sigmoid colon. PERITONEUM AND RETROPERITONEUM: No free air or free fluid in the abdomen. No loculated abscess collection. VASCULATURE: Normal caliber abdominal aorta. Minimal aortic calcification. LYMPH NODES: No retroperitoneal lymphadenopathy. REPRODUCTIVE ORGANS: The uterus is not enlarged. No abnormal adnexal masses. BONES AND SOFT TISSUES: Degenerative changes in the spine and hips. 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 No focal soft tissue abnormality. IMPRESSION: 1. No acute abnormality in the abdomen or pelvis. No abscess or obstructive uropathy. 2. 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 No new liver lesions. 3. Cholelithiasis Electronically signed by: Elsie Gravely MD 10/28/2024 08:31 PM EST RP Workstation: [id number]    DG Chest Port 1 View Result Date: 10/28/2024 EXAM: 1 VIEW(S) XRAY OF THE CHEST 10/28/2024 07:07:00 PM COMPARISON: None available. CLINICAL HISTORY: Fever. FINDINGS: LUNGS AND PLEURA: No focal pulmonary opacity. No pleural effusion. No pneumothorax. HEART AND MEDIASTINUM: No acute abnormality of the cardiac and mediastinal silhouettes. BONES AND SOFT TISSUES: No acute osseous abnormality. IMPRESSION: 1. No acute process. Electronically signed by: Dorethia Molt MD 10/28/2024 07:16 PM EST RP Workstation: [id number]    EKG: I independently viewed the EKG done and my findings are as followed: None available at time of this visit.  Assessment/Plan Present on Admission:  Right flank pain  Principal Problem:   Right flank pain   Right flank pain, concern for pyelonephritis, POA Complicated UTI, POA UA positive for pyuria Follow urine culture and peripheral blood cultures x 2 for ID and sensitivities Pain control Continue IV antibiotics, cefepime . Monitor urine output Continue IV fluid hydration. Maintain MAP greater than 65 Monitor fever curve and WBCs.  Lactic acidosis secondary to the above Lactic acid 2.6, repeat 0.7 after IV fluid hydration. Continue IV fluid hydration and IV antibiotic.  Right ureter injury following Foley catheter placement 07/30/2021 Status post placement of right ureteral stent and Foley catheter 07/31/2021, now removed  AKI, suspect multifactorial  Baseline creatinine appears to be 1.0 with GFR greater than 60 Presented with creatinine 1.80 with GFR of 35. Avoid nephrotoxic agents, dehydration, hypotension. Gentle IV fluid hydration LR at 50 cc/h x 2 days Monitor urine output Repeat BMP in the morning.  Mildly elevated LFTs Alkaline phosphatase 133, AST 45, ALT 52 T. bili 1.7 Avoid hepatotoxic agents Trend LFTs.  Chronic anxiety/depression Resume home regimen.  Restless leg syndrome Resume home regimen.  Obesity BMI 32 Recommend weight loss  outpatient regular physical activity and healthy dieting.  History of right rectal cancer with metastasis to the liver, in remission Follows with medical oncology Dr. Deanne   Time: 75 minutes.   DVT prophylaxis: Subcu Lovenox  daily.  Code Status: Full code.  Family Communication: None at bedside.  Disposition Plan: Admitted to telemetry unit.  Consults called: None.  Admission status: Inpatient status.  Status is: Inpatient The patient requires at least 2 midnights for further evaluation and treatment of present condition.   Norma LOISE Hurst MD [facility] Pager [phone]  If 7PM-7AM, please contact night-coverage [email or web address] Password [facility]  10/29/2024, 12:33 AM      [1]  Allergies Allergen Reactions   Prednisone Other (See Comments)    Felt like she was crazy if on it more than 1 week   Codeine Other (See Comments)    Causes migraine--patient requests this be kept on allergy list   "

## 2024-10-29 NOTE — Progress Notes (Signed)
 Same-day admit follow-up Admitted early this morning with chills and right flank pain with UA positive for urinary tract infection/pyelonephritis.  Started on fluids and antibiotics with improvement renal functions improved.  Will monitor and follow-up with the cultures and adjust antibiotic as necessary.  Follow-up labs in AM. Patient was seen this morning all questions answered.

## 2024-10-30 DIAGNOSIS — R10A1 Flank pain, right side: Secondary | ICD-10-CM | POA: Diagnosis not present

## 2024-10-30 LAB — COMPREHENSIVE METABOLIC PANEL WITH GFR
ALT: 29 U/L (ref 0–44)
AST: 23 U/L (ref 15–41)
Albumin: 2.9 g/dL — ABNORMAL LOW (ref 3.5–5.0)
Alkaline Phosphatase: 94 U/L (ref 38–126)
Anion gap: 10 (ref 5–15)
BUN: 18 mg/dL (ref 8–23)
CO2: 21 mmol/L — ABNORMAL LOW (ref 22–32)
Calcium: 8.6 mg/dL — ABNORMAL LOW (ref 8.9–10.3)
Chloride: 104 mmol/L (ref 98–111)
Creatinine, Ser: 1.33 mg/dL — ABNORMAL HIGH (ref 0.44–1.00)
GFR, Estimated: 45 mL/min — ABNORMAL LOW
Glucose, Bld: 76 mg/dL (ref 70–99)
Potassium: 3.7 mmol/L (ref 3.5–5.1)
Sodium: 136 mmol/L (ref 135–145)
Total Bilirubin: 0.7 mg/dL (ref 0.0–1.2)
Total Protein: 5.8 g/dL — ABNORMAL LOW (ref 6.5–8.1)

## 2024-10-30 LAB — URINE CULTURE: Culture: NO GROWTH

## 2024-10-30 LAB — CBC
HCT: 33.5 % — ABNORMAL LOW (ref 36.0–46.0)
Hemoglobin: 10.7 g/dL — ABNORMAL LOW (ref 12.0–15.0)
MCH: 30.3 pg (ref 26.0–34.0)
MCHC: 31.9 g/dL (ref 30.0–36.0)
MCV: 94.9 fL (ref 80.0–100.0)
Platelets: 143 10*3/uL — ABNORMAL LOW (ref 150–400)
RBC: 3.53 MIL/uL — ABNORMAL LOW (ref 3.87–5.11)
RDW: 13.7 % (ref 11.5–15.5)
WBC: 7.9 10*3/uL (ref 4.0–10.5)
nRBC: 0 % (ref 0.0–0.2)

## 2024-10-30 MED ORDER — BISACODYL 5 MG PO TBEC
10.0000 mg | DELAYED_RELEASE_TABLET | Freq: Every day | ORAL | Status: DC
  Administered 2024-10-30 – 2024-11-03 (×5): 10 mg via ORAL
  Filled 2024-10-30 (×6): qty 2

## 2024-10-30 MED ORDER — POLYETHYLENE GLYCOL 3350 17 G PO PACK
17.0000 g | PACK | Freq: Two times a day (BID) | ORAL | Status: DC
  Administered 2024-10-30 – 2024-11-03 (×8): 17 g via ORAL
  Filled 2024-10-30 (×9): qty 1

## 2024-10-30 NOTE — Progress Notes (Signed)
" °   10/30/24 1632  TOC Brief Assessment  Insurance and Status Reviewed  Patient has primary care physician Yes  Home environment has been reviewed From home  Prior level of function: Independent  Prior/Current Home Services No current home services  Social Drivers of Health Review SDOH reviewed no interventions necessary  Readmission risk has been reviewed Yes  Transition of care needs no transition of care needs at this time    "

## 2024-10-30 NOTE — Progress Notes (Signed)
 " PROGRESS NOTE    Norma Colon  FMW:[id number] DOB: November 29, 1960 DOA: 10/28/2024 PCP: Mat Browning, MD  Brief Narrative: 64 year old female with a history of rectal cancer with mets to the liver in remission restless leg syndrome right ureteral injury chronic anxiety depression chronic low back pain status post right ureteral stent placement admitted with right flank pain and shaking chills and fever.  She had a temp of 102.5 prior to coming into the ER associated with right CVA tenderness and dysuria.  In the ER she was found to be tachypneic tachycardic with a fever and UA was positive for pyuria and she was started on IV antibiotics.  In the ED she received vancomycin  and Flagyl  and cefepime . CT abdomen and pelvis showed no acute abnormality or abscess or obstructive uropathy.  Stable hepatic mets no new liver lesions.  Cholelithiasis seen.  No CBD dilatation no inflammatory stranding.  Assessment & Plan:   Principal Problem:   Right flank pain  #1  Fever patient admitted with right flank pain fever and chills Tmax of 102.8.  On admission with right CVA tenderness and dysuria.  Her UA is consistent with UTI however urine culture did not show any growth and still spiking temp on IV cefepime . She denies any respiratory symptoms or diarrhea and no other focus of infection noted at this time.  Blood cultures pending. On admission she had lactic acidosis which resolved with IV fluids and IV antibiotics. Patient has history of right ureter injury and had Foley catheter placed at 1 time which she does not have anymore.  #2 AKI improving with IV fluids avoid dehydration hypotension nephrotoxic agents.  Follow-up labs as needed.  #3 history of anxiety and depression continue home meds  #4 history of restless leg syndrome continue home meds  #5 history of right rectal cancer with mets to the liver in remission discussed with Dr. Cloretta   Estimated body mass index is 32.23 kg/m as  calculated from the following:   Height as of this encounter: 5' 8 (1.727 m).   Weight as of this encounter: 96.2 kg.  DVT prophylaxis: lovenox  Code Status: full Family Communication:none Disposition Plan:  Status is: Inpatient Remains inpatient appropriate because: fever   Consultants:  onc  Procedures: none Antimicrobials:none  Subjective: Tmax 102.8 Still having chills flank pain   Objective: Vitals:   10/29/24 1932 10/29/24 2040 10/30/24 0212 10/30/24 0534  BP: 108/65 (!) 103/51 116/65 111/60  Pulse: 95 95 90 88  Resp: 20 18 18 18   Temp:  (!) 101.1 F (38.4 C) 99.9 F (37.7 C) 100 F (37.8 C)  TempSrc:      SpO2: 94% 93% 94% 94%  Weight:      Height:       No intake or output data in the 24 hours ending 10/30/24 0836 Filed Weights   10/28/24 1838 10/28/24 2108  Weight: 97.5 kg 96.2 kg    Examination:  General exam: Appears in nad  Respiratory system: Clear to auscultation. Respiratory effort normal. Cardiovascular system: reg Gastrointestinal system: Abdomen is nondistended, soft and nontender. No organomegaly or masses felt. Normal bowel sounds heard. Central nervous system: Alert and oriented. No focal neurological deficits. Extremities: no edema   Data Reviewed: I have personally reviewed following labs and imaging studies  CBC: Recent Labs  Lab 10/28/24 1923 10/28/24 1941 10/29/24 0115 10/30/24 0502  WBC 11.6*  --  10.2 7.9  NEUTROABS 9.2*  --  8.5*  --   HGB 13.4  13.9 11.4* 10.7*  HCT 41.1 41.0 33.9* 33.5*  MCV 93.0  --  93.6 94.9  PLT 206  --  159 143*   Basic Metabolic Panel: Recent Labs  Lab 10/28/24 1923 10/28/24 1941 10/29/24 0115 10/30/24 0502  NA 137 138 137 136  K 3.4* 4.0 4.1 3.7  CL 102 101 106 104  CO2 21*  --  21* 21*  GLUCOSE 87 84 81 76  BUN 25* 32* 23 18  CREATININE 1.63* 1.80* 1.53* 1.33*  CALCIUM  9.3  --  8.2* 8.6*  MG  --   --  2.0  --   PHOS  --   --  4.2  --    GFR: Estimated Creatinine Clearance: 53.9  mL/min (A) (by C-G formula based on SCr of 1.33 mg/dL (H)). Liver Function Tests: Recent Labs  Lab 10/28/24 1923 10/29/24 0115 10/30/24 0502  AST 45* 32 23  ALT 52* 40 29  ALKPHOS 133* 105 94  BILITOT 1.7* 1.2 0.7  PROT 7.5 5.9* 5.8*  ALBUMIN 3.9 3.1* 2.9*   No results for input(s): LIPASE, AMYLASE in the last 168 hours. No results for input(s): AMMONIA in the last 168 hours. Coagulation Profile: Recent Labs  Lab 10/29/24 0115  INR 1.5*   Cardiac Enzymes: No results for input(s): CKTOTAL, CKMB, CKMBINDEX, TROPONINI in the last 168 hours. BNP (last 3 results) No results for input(s): PROBNP in the last 8760 hours. HbA1C: No results for input(s): HGBA1C in the last 72 hours. CBG: No results for input(s): GLUCAP in the last 168 hours. Lipid Profile: No results for input(s): CHOL, HDL, LDLCALC, TRIG, CHOLHDL, LDLDIRECT in the last 72 hours. Thyroid  Function Tests: No results for input(s): TSH, T4TOTAL, FREET4, T3FREE, THYROIDAB in the last 72 hours. Anemia Panel: No results for input(s): VITAMINB12, FOLATE, FERRITIN, TIBC, IRON, RETICCTPCT in the last 72 hours. Sepsis Labs: Recent Labs  Lab 10/28/24 1938 10/28/24 2056  LATICACIDVEN 2.6* 0.7    Recent Results (from the past 240 hours)  Blood Culture (routine x 2)     Status: None (Preliminary result)   Collection Time: 10/28/24  7:20 PM   Specimen: BLOOD RIGHT WRIST  Result Value Ref Range Status   Specimen Description   Final    BLOOD RIGHT WRIST Performed at [facility], [address], [place], [place] [address]    Special Requests   Final    BOTTLES DRAWN AEROBIC AND ANAEROBIC Blood Culture adequate volume Performed at [facility], [address], [place], [place] [address]    Culture   Final    NO GROWTH < 12 HOURS Performed at [facility], [address], [place], [place] [address]    Report Status PENDING   Incomplete  Blood Culture (routine x 2)     Status: None (Preliminary result)   Collection Time: 10/28/24  7:20 PM   Specimen: Left Antecubital; Blood  Result Value Ref Range Status   Specimen Description   Final    LEFT ANTECUBITAL Performed at [facility], [address], [place], [place] [address]    Special Requests   Final    BOTTLES DRAWN AEROBIC AND ANAEROBIC Blood Culture adequate volume Performed at [facility], [address], [place], [place] [address]    Culture   Final    NO GROWTH < 12 HOURS Performed at [facility], [address] [place], [place] [address]    Report Status PENDING  Incomplete  Resp panel by RT-PCR (RSV, Flu A&B, Covid) Peripheral     Status: None   Collection Time: 10/28/24  7:39 PM   Specimen: Peripheral; Nasal Swab  Result Value Ref Range Status   SARS Coronavirus 2 by RT PCR NEGATIVE NEGATIVE Final    Comment: (NOTE) SARS-CoV-2 target nucleic acids are NOT DETECTED.  The SARS-CoV-2 RNA is generally detectable in upper respiratory specimens during the acute phase of infection. The lowest concentration of SARS-CoV-2 viral copies this assay can detect is 138 copies/mL. A negative result does not preclude SARS-Cov-2 infection and should not be used as the sole basis for treatment or other patient management decisions. A negative result may occur with  improper specimen collection/handling, submission of specimen other than nasopharyngeal swab, presence of viral mutation(s) within the areas targeted by this assay, and inadequate number of viral copies(<138 copies/mL). A negative result must be combined with clinical observations, patient history, and epidemiological information. The expected result is Negative.  Fact Sheet for Patients:  bloggercourse.com  Fact Sheet for Healthcare Providers:  seriousbroker.it  This test is no t yet approved or  cleared by the [place]  FDA and  has been authorized for detection and/or diagnosis of SARS-CoV-2 by FDA under an Emergency Use Authorization (EUA). This EUA will remain  in effect (meaning this test can be used) for the duration of the COVID-19 declaration under Section 564(b)(1) of the Act, 21 U.S.C.section 360bbb-3(b)(1), unless the authorization is terminated  or revoked sooner.       Influenza A by PCR NEGATIVE NEGATIVE Final   Influenza B by PCR NEGATIVE NEGATIVE Final    Comment: (NOTE) The Xpert Xpress SARS-CoV-2/FLU/RSV plus assay is intended as an aid in the diagnosis of influenza from Nasopharyngeal swab specimens and should not be used as a sole basis for treatment. Nasal washings and aspirates are unacceptable for Xpert Xpress SARS-CoV-2/FLU/RSV testing.  Fact Sheet for Patients: bloggercourse.com  Fact Sheet for Healthcare Providers: seriousbroker.it  This test is not yet approved or cleared by the [place]  FDA and has been authorized for detection and/or diagnosis of SARS-CoV-2 by FDA under an Emergency Use Authorization (EUA). This EUA will remain in effect (meaning this test can be used) for the duration of the COVID-19 declaration under Section 564(b)(1) of the Act, 21 U.S.C. section 360bbb-3(b)(1), unless the authorization is terminated or revoked.     Resp Syncytial Virus by PCR NEGATIVE NEGATIVE Final    Comment: (NOTE) Fact Sheet for Patients: bloggercourse.com  Fact Sheet for Healthcare Providers: seriousbroker.it  This test is not yet approved or cleared by the [place]  FDA and has been authorized for detection and/or diagnosis of SARS-CoV-2 by FDA under an Emergency Use Authorization (EUA). This EUA will remain in effect (meaning this test can be used) for the duration of the COVID-19 declaration under Section 564(b)(1) of the Act, 21  U.S.C. section 360bbb-3(b)(1), unless the authorization is terminated or revoked.  Performed at [facility], [address], [place], [place] [address]   Urine Culture     Status: None   Collection Time: 10/28/24 11:09 PM   Specimen: Urine, Random  Result Value Ref Range Status   Specimen Description   Final    URINE, RANDOM Performed at [facility], [address], [place], [place] [address]    Special Requests   Final    NONE Reflexed from [id number] Performed at [facility], [address] [place], [place] [address]  Culture   Final    NO GROWTH Performed at [facility], [address], [place], [place] [address]    Report Status 10/30/2024 FINAL  Final         Radiology Studies: CT ABDOMEN PELVIS W CONTRAST Result Date: 10/28/2024 EXAM: CT ABDOMEN AND PELVIS WITH CONTRAST 10/28/2024 08:22:40 PM TECHNIQUE: CT of the abdomen and pelvis was performed with the administration of 75 mL of iohexol  (OMNIPAQUE ) 300 MG/ML solution. Multiplanar reformatted images are provided for review. Automated exposure control, iterative reconstruction, and/or weight-based adjustment of the mA/kV was utilized to reduce the radiation dose to as low as reasonably achievable. COMPARISON: MRI abdomen 08/03/2024 and 01/23/2024. CT chest abdomen and pelvis 10/01/2023. CLINICAL HISTORY: Right flank pain, sepsis. Fever, chills, and right flank pain since yesterday. FINDINGS: LOWER CHEST: No acute abnormality. Lung bases are clear. LIVER: Diffuse fatty infiltration of the liver. Focal liver lesions including a heterogeneous hypodense lesion in segment 6 measuring 2.4 cm in diameter and a heterogeneous hypodense lesion in segment 2 measuring 1.3 cm diameter. No change in appearance with prior study. These lesions are consistent with known treated metastases. No new liver lesions are identified. GALLBLADDER AND BILE DUCTS: Cholelithiasis. No  inflammatory stranding. No bile duct dilatation. SPLEEN: The spleen is unremarkable. PANCREAS: Pancreas is unremarkable. ADRENAL GLANDS: Adrenal glands are unremarkable. KIDNEYS, URETERS AND BLADDER: Kidneys, ureters, and the bladder are normal. GI AND BOWEL: Stool-filled colon. Stomach and small bowel are not abnormally distended. No wall thickening or inflammatory stranding. Appendix is normal. Surgical anastomosis in the sigmoid colon. PERITONEUM AND RETROPERITONEUM: No free air or free fluid in the abdomen. No loculated abscess collection. VASCULATURE: Normal caliber abdominal aorta. Minimal aortic calcification. LYMPH NODES: No retroperitoneal lymphadenopathy. REPRODUCTIVE ORGANS: The uterus is not enlarged. No abnormal adnexal masses. BONES AND SOFT TISSUES: Degenerative changes in the spine and hips. 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 No focal soft tissue abnormality. IMPRESSION: 1. No acute abnormality in the abdomen or pelvis. No abscess or obstructive uropathy. 2. 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 No new liver lesions. 3. Cholelithiasis Electronically signed by: Elsie Gravely MD 10/28/2024 08:31 PM EST RP Workstation: [id number]   DG Chest Port 1 View Result Date: 10/28/2024 EXAM: 1 VIEW(S) XRAY OF THE CHEST 10/28/2024 07:07:00 PM COMPARISON: None available. CLINICAL HISTORY: Fever. FINDINGS: LUNGS AND PLEURA: No focal pulmonary opacity. No pleural effusion. No pneumothorax. HEART AND MEDIASTINUM: No acute abnormality of the cardiac and mediastinal silhouettes. BONES AND SOFT TISSUES: No acute osseous abnormality. IMPRESSION: 1. No acute process. Electronically signed by: Dorethia Molt MD 10/28/2024 07:16 PM EST RP Workstation: [id number]     Scheduled Meds:  alprazolam   2 mg Oral q morning   buPROPion   300 mg Oral q morning   chlorhexidine   15 mL Mouth/Throat BID   enoxaparin  (LOVENOX ) injection  40 mg Subcutaneous Q24H   rOPINIRole   0.25  mg Oral QHS   Continuous Infusions:  ceFEPime  (MAXIPIME ) IV 2 g (10/29/24 1936)     LOS: 2 days     Almarie KANDICE Hoots, MD 10/30/2024, 8:36 AM   "

## 2024-10-30 NOTE — Plan of Care (Signed)
  Problem: Education: Goal: Knowledge of General Education information will improve Description: Including pain rating scale, medication(s)/side effects and non-pharmacologic comfort measures Outcome: Progressing   Problem: Health Behavior/Discharge Planning: Goal: Ability to manage health-related needs will improve Outcome: Progressing   Problem: Activity: Goal: Risk for activity intolerance will decrease Outcome: Progressing   Problem: Nutrition: Goal: Adequate nutrition will be maintained Outcome: Progressing   Problem: Pain Managment: Goal: General experience of comfort will improve and/or be controlled Outcome: Progressing   Problem: Safety: Goal: Ability to remain free from injury will improve Outcome: Progressing   Problem: Skin Integrity: Goal: Risk for impaired skin integrity will decrease Outcome: Progressing

## 2024-10-30 NOTE — Progress Notes (Deleted)
" °   10/30/24 1632  TOC Brief Assessment  Insurance and Status Reviewed  Patient has primary care physician Yes  Home environment has been reviewed From home  Prior level of function: Independent  Prior/Current Home Services No current home services  Social Drivers of Health Review SDOH reviewed no interventions necessary  Readmission risk has been reviewed Yes  Transition of care needs no transition of care needs at this time    "

## 2024-10-31 ENCOUNTER — Encounter (HOSPITAL_COMMUNITY): Payer: Self-pay | Admitting: Internal Medicine

## 2024-10-31 ENCOUNTER — Inpatient Hospital Stay (HOSPITAL_COMMUNITY)

## 2024-10-31 DIAGNOSIS — C787 Secondary malignant neoplasm of liver and intrahepatic bile duct: Secondary | ICD-10-CM | POA: Diagnosis not present

## 2024-10-31 DIAGNOSIS — Z85048 Personal history of other malignant neoplasm of rectum, rectosigmoid junction, and anus: Secondary | ICD-10-CM

## 2024-10-31 DIAGNOSIS — S3710XA Unspecified injury of ureter, initial encounter: Secondary | ICD-10-CM | POA: Insufficient documentation

## 2024-10-31 DIAGNOSIS — M545 Low back pain, unspecified: Secondary | ICD-10-CM | POA: Diagnosis not present

## 2024-10-31 DIAGNOSIS — R509 Fever, unspecified: Secondary | ICD-10-CM

## 2024-10-31 DIAGNOSIS — N179 Acute kidney failure, unspecified: Secondary | ICD-10-CM

## 2024-10-31 DIAGNOSIS — F413 Other mixed anxiety disorders: Secondary | ICD-10-CM

## 2024-10-31 DIAGNOSIS — G8929 Other chronic pain: Secondary | ICD-10-CM

## 2024-10-31 DIAGNOSIS — R10A1 Flank pain, right side: Secondary | ICD-10-CM | POA: Diagnosis not present

## 2024-10-31 DIAGNOSIS — K08109 Complete loss of teeth, unspecified cause, unspecified class: Secondary | ICD-10-CM | POA: Insufficient documentation

## 2024-10-31 LAB — HEPATITIS B SURFACE ANTIGEN: Hepatitis B Surface Ag: NONREACTIVE

## 2024-10-31 LAB — URINE CULTURE: Culture: NO GROWTH

## 2024-10-31 LAB — MISC LABCORP TEST (SEND OUT)
LabCorp test name: 83935
Labcorp test code: 83935

## 2024-10-31 LAB — SEDIMENTATION RATE: Sed Rate: 116 mm/h — ABNORMAL HIGH (ref 0–22)

## 2024-10-31 MED ORDER — IOHEXOL 300 MG/ML  SOLN
75.0000 mL | Freq: Once | INTRAMUSCULAR | Status: AC | PRN
  Administered 2024-10-31: 75 mL via INTRAVENOUS

## 2024-10-31 MED ORDER — GADOBUTROL 1 MMOL/ML IV SOLN
10.0000 mL | Freq: Once | INTRAVENOUS | Status: AC | PRN
  Administered 2024-10-31: 10 mL via INTRAVENOUS

## 2024-10-31 NOTE — Consult Note (Addendum)
 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 and course complicated by ureteral injury requiring surgery status post resection of her malignancy as well as ablation x 2 to liver metastases Anxiety and depression  Plan:  Await read on CT maxillofacial Will get MRI of her lumbar spine given the area of chronic flank pain is more in the lower back area laterally MRI of the right hip with and without contrast 2D echocardiogram Dopplers of the upper and lower extremities Sed rate CRP Check HIV and viral hepatitides Continue cefepime  for now Standard universal precautions      HPI: Norma Colon is a 64 year old woman with a history of rectal cancer with metastasis to the liver status post surgery to resect rectal cancer ablation of hepatic metastases with then recurrence and ablation again who also completed chemotherapy and radiation has been cancer free for 2 years. She also had ureteral injury during this time as complication of surgery requiring two urological procedures. She also has anxiety and depression and who has had CHRONIC RIGHT FLANK pain that she has experienced after ablation of the liver lesions.  She was admitted the hospital service with high fevers up to 102.5 and pain that was not just in her usual flank area but that radiated around the front as well.  She was also tachypneic and tachycardic and febrile she had pyuria and she was started on antibiotics urine culture was done but after she had received cefepime .  When asked about dysuria she said that she did not have this at home but she developed it after she was admitted.    She also initially had received vancomycin  and Flagyl .  CT of the abdomen  pelvis did not show any acute abnormality but some stable treated hepatic metastases in segment 2 and 6 all with along with some gallstones  She has been continued on cefepime  but has had subjective fevers and slightly elevated temperatures when they have been measured.  Her blood cultures have been negative.  She did have extraction of remaining teeth done a week prior to this Monday.  She also had some type of ablation with obstetrics and gynecology at the end of the last week on Thursday.  I recommended an MRI of the T-spine given her stated history of flank pain and this MRI showed no acute findings other than some small bilateral pleural effusions that I expect are likely transudative in nature.  She also had a CT of the head and the CT maxillofacial done which are not back yet.  When I examined her she showed me the area that she described as her kidney pain but it actually is lower than the thoracic area more in the lumbar area laterally.  She has history also of right hip arthritis and pain there that is slightly worse than usual she does have some pain laterally with internal and external rotation of the hip.  I think it is certainly possible that she had a urinary tract infection based on the high fevers and flank pain but the flank pain is very much confounded by her chronic pain in this area.  In terms of her  condition she feels better in terms of the pain that radiated out across the front of the abdomen that has disappeared and looking at her objective temperature measurements they have improved as well.  She is very apprehensive about having something missed as far as an infectious etiology and I think this is reasonable.  I have ordered MRI of the lumbar spine and the right hip to look for potential infection there.  I will follow-up on the read the CT maxillofacial and CT head of also ordered Dopplers of the lower and upper extremities and a 2D echocardiogram.  I will screen  her for HIV as well as viral hepatitides and check a sed rate and CRP.  I will continue cefepime  for now but will likely stop it tomorrow      I personally spent a total of 85 minutes in the care of the patient today including preparing to see the patient, getting/reviewing separately obtained history, performing a medically appropriate exam/evaluation, counseling and educating, placing orders, referring and communicating with other health care professionals, documenting clinical information in the EHR, independently interpreting results, communicating results, and coordinating care.   Evaluation of the patient requires complex antimicrobial therapy evaluation, counseling , isolation needs to reduce disease transmission and risk assessment and mitigation.     Review of Systems: Review of Systems  Constitutional:  Positive for fever. Negative for chills, malaise/fatigue and weight loss.  HENT:  Negative for congestion and sore throat.   Eyes:  Negative for blurred vision and photophobia.  Respiratory:  Negative for cough, shortness of breath and wheezing.   Cardiovascular:  Negative for chest pain, palpitations and leg swelling.  Gastrointestinal:  Negative for abdominal pain, blood in stool, constipation, diarrhea, heartburn, melena, nausea and vomiting.  Genitourinary:  Positive for flank pain. Negative for dysuria and hematuria.  Musculoskeletal:  Positive for myalgias. Negative for back pain, falls and joint pain.  Skin:  Negative for itching and rash.  Neurological:  Positive for headaches. Negative for dizziness, focal weakness, loss of consciousness and weakness.  Endo/Heme/Allergies:  Does not bruise/bleed easily.  Psychiatric/Behavioral:  Negative for depression and suicidal ideas. The patient does not have insomnia.     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[facility]) oncologist-- dr cloretta--  per last note in epic ,  clinical remission   dx 08/  2019---- chemoradiation concurrent completed 07-20-2018;  s/p  low anterior resection 09-16-2018;   chemo 10-17-2018  ot 12-19-2018   Anxiety    Arthritis    Chemotherapy induced neutropenia    Depression    Headache    migraines    History of HPV infection    History of kidney stones    Hydronephrosis, right    Restless leg syndrome    Scoliosis    Sepsis (HCC)    11-2018    Social History[1]  Family History  Problem Relation Age of Onset   COPD Father        smoker   Stomach cancer Father        spread to lungs and bones   Arthritis Mother    Heart attack Paternal Grandmother    Heart attack Paternal Grandfather    Heart attack Maternal Grandmother    Stroke Maternal Grandmother    Colon cancer Neg Hx    Rectal cancer Neg Hx    Esophageal cancer Neg Hx    Allergies[2]  OBJECTIVE: Blood pressure 117/63, pulse 89, temperature 97.7 F (36.5 C),  temperature source Oral, resp. rate 18, height 5' 8 (1.727 m), weight 96.2 kg, SpO2 (!) 89%.  Physical Exam Constitutional:      General: She is not in acute distress.    Appearance: Normal appearance. She is well-developed. She is not ill-appearing or diaphoretic.  HENT:     Head: Normocephalic and atraumatic.     Right Ear: Hearing and external ear normal.     Left Ear: Hearing and external ear normal.     Nose: No nasal deformity or rhinorrhea.     Mouth/Throat:     Comments: Sutures without purulence all teeth have been removed Eyes:     General: No scleral icterus.    Conjunctiva/sclera: Conjunctivae normal.     Right eye: Right conjunctiva is not injected.     Left eye: Left conjunctiva is not injected.     Pupils: Pupils are equal, round, and reactive to light.  Neck:     Vascular: No JVD.  Cardiovascular:     Rate and Rhythm: Normal rate and regular rhythm.     Heart sounds: Normal heart sounds, S1 normal and S2 normal. No murmur heard.    No friction rub. No gallop.  Pulmonary:     Effort: No respiratory  distress.     Breath sounds: No stridor. No wheezing or rhonchi.  Abdominal:     General: Bowel sounds are normal. There is no distension.     Palpations: Abdomen is soft.     Tenderness: There is no abdominal tenderness.  Musculoskeletal:     Right shoulder: Decreased range of motion.     Left shoulder: Normal.     Cervical back: Normal range of motion and neck supple.     Right hip: Normal.     Left hip: Normal.     Right knee: Normal.     Left knee: Normal.     Comments: Pain laterally with internal and external rotation  Lymphadenopathy:     Head:     Right side of head: No submandibular, preauricular or posterior auricular adenopathy.     Left side of head: No submandibular, preauricular or posterior auricular adenopathy.     Cervical: No cervical adenopathy.     Right cervical: No superficial or deep cervical adenopathy.    Left cervical: No superficial or deep cervical adenopathy.  Skin:    General: Skin is warm and dry.     Coloration: Skin is not pale.     Findings: No abrasion, bruising, ecchymosis, erythema, lesion or rash.     Nails: There is no clubbing.  Neurological:     General: No focal deficit present.     Mental Status: She is alert and oriented to person, place, and time.     Sensory: No sensory deficit.     Coordination: Coordination normal.     Gait: Gait normal.  Psychiatric:        Attention and Perception: She is attentive.        Mood and Affect: Mood normal.        Speech: Speech normal.        Behavior: Behavior normal. Behavior is cooperative.        Thought Content: Thought content normal.        Judgment: Judgment normal.     Lab Results Lab Results  Component Value Date   WBC 7.9 10/30/2024   HGB 10.7 (L) 10/30/2024   HCT 33.5 (L) 10/30/2024   MCV 94.9 10/30/2024  PLT 143 (L) 10/30/2024    Lab Results  Component Value Date   CREATININE 1.33 (H) 10/30/2024   BUN 18 10/30/2024   NA 136 10/30/2024   K 3.7 10/30/2024   CL 104  10/30/2024   CO2 21 (L) 10/30/2024    Lab Results  Component Value Date   ALT 29 10/30/2024   AST 23 10/30/2024   ALKPHOS 94 10/30/2024   BILITOT 0.7 10/30/2024     Microbiology: Recent Results (from the past 240 hours)  Blood Culture (routine x 2)     Status: None (Preliminary result)   Collection Time: 10/28/24  7:20 PM   Specimen: BLOOD RIGHT WRIST  Result Value Ref Range Status   Specimen Description   Final    BLOOD RIGHT WRIST Performed at [facility], [address] [place], [place] [address]    Special Requests   Final    BOTTLES DRAWN AEROBIC AND ANAEROBIC Blood Culture adequate volume Performed at [facility], [address] [place], [place] [address]    Culture   Final    NO GROWTH 2 DAYS Performed at [facility], [address], [place], [place] [address]    Report Status PENDING  Incomplete  Blood Culture (routine x 2)     Status: None (Preliminary result)   Collection Time: 10/28/24  7:20 PM   Specimen: Left Antecubital; Blood  Result Value Ref Range Status   Specimen Description   Final    LEFT ANTECUBITAL Performed at [facility], [address], [place], [place] [address]    Special Requests   Final    BOTTLES DRAWN AEROBIC AND ANAEROBIC Blood Culture adequate volume Performed at [facility], [address], [place], [place] [address]    Culture   Final    NO GROWTH 2 DAYS Performed at [facility], [address] [place], [place] [address]    Report Status PENDING  Incomplete  Resp panel by RT-PCR (RSV, Flu A&B, Covid) Peripheral     Status: None   Collection Time: 10/28/24  7:39 PM   Specimen: Peripheral; Nasal Swab  Result Value Ref Range Status   SARS Coronavirus 2 by RT PCR NEGATIVE NEGATIVE Final    Comment: (NOTE) SARS-CoV-2 target nucleic acids are NOT DETECTED.  The SARS-CoV-2 RNA is generally detectable in upper respiratory specimens during  the acute phase of infection. The lowest concentration of SARS-CoV-2 viral copies this assay can detect is 138 copies/mL. A negative result does not preclude SARS-Cov-2 infection and should not be used as the sole basis for treatment or other patient management decisions. A negative result may occur with  improper specimen collection/handling, submission of specimen other than nasopharyngeal swab, presence of viral mutation(s) within the areas targeted by this assay, and inadequate number of viral copies(<138 copies/mL). A negative result must be combined with clinical observations, patient history, and epidemiological information. The expected result is Negative.  Fact Sheet for Patients:  bloggercourse.com  Fact Sheet for Healthcare Providers:  seriousbroker.it  This test is no t yet approved or cleared by the [place]  FDA and  has been authorized for detection and/or diagnosis of SARS-CoV-2 by FDA under an Emergency Use Authorization (EUA). This EUA will remain  in effect (meaning this test can be used) for the duration of the COVID-19 declaration under Section 564(b)(1) of the Act, 21 U.S.C.section 360bbb-3(b)(1), unless the authorization is terminated  or revoked sooner.  Influenza A by PCR NEGATIVE NEGATIVE Final   Influenza B by PCR NEGATIVE NEGATIVE Final    Comment: (NOTE) The Xpert Xpress SARS-CoV-2/FLU/RSV plus assay is intended as an aid in the diagnosis of influenza from Nasopharyngeal swab specimens and should not be used as a sole basis for treatment. Nasal washings and aspirates are unacceptable for Xpert Xpress SARS-CoV-2/FLU/RSV testing.  Fact Sheet for Patients: bloggercourse.com  Fact Sheet for Healthcare Providers: seriousbroker.it  This test is not yet approved or cleared by the [place]  FDA and has been authorized for detection and/or  diagnosis of SARS-CoV-2 by FDA under an Emergency Use Authorization (EUA). This EUA will remain in effect (meaning this test can be used) for the duration of the COVID-19 declaration under Section 564(b)(1) of the Act, 21 U.S.C. section 360bbb-3(b)(1), unless the authorization is terminated or revoked.     Resp Syncytial Virus by PCR NEGATIVE NEGATIVE Final    Comment: (NOTE) Fact Sheet for Patients: bloggercourse.com  Fact Sheet for Healthcare Providers: seriousbroker.it  This test is not yet approved or cleared by the [place]  FDA and has been authorized for detection and/or diagnosis of SARS-CoV-2 by FDA under an Emergency Use Authorization (EUA). This EUA will remain in effect (meaning this test can be used) for the duration of the COVID-19 declaration under Section 564(b)(1) of the Act, 21 U.S.C. section 360bbb-3(b)(1), unless the authorization is terminated or revoked.  Performed at [facility], [address], [place], [place] [address]   Urine Culture     Status: None   Collection Time: 10/28/24 11:09 PM   Specimen: Urine, Random  Result Value Ref Range Status   Specimen Description   Final    URINE, RANDOM Performed at [facility], [address] [place], [place] [address]    Special Requests   Final    NONE Reflexed from [id number] Performed at [facility], [address], [place], [place] [address]    Culture   Final    NO GROWTH Performed at [facility], [address] [place], [place] [address]    Report Status 10/30/2024 FINAL  Final  Urine Culture (for pregnant, neutropenic or urologic patients or patients with an indwelling urinary catheter)     Status: None   Collection Time: 10/30/24  3:07 PM   Specimen: Urine, Clean Catch  Result Value Ref Range Status   Specimen Description   Final    URINE, CLEAN CATCH Performed at [facility], [address], [place], [place] [address]    Special Requests   Final    NONE Performed at [facility], [address] [place], [place] [address]    Culture   Final    NO GROWTH Performed at [facility], [address] [place], [place] [address]    Report Status 10/31/2024 FINAL  Final    Jomarie Fleeta Rothman, MD [facility] [phone] pager  10/31/2024, 4:48 PM      [1]  Social History Tobacco Use   Smoking status: Former    Current packs/day: 0.00    Average packs/day: 1 pack/day for 30.0 years (30.0 ttl pk-yrs)    Types: Cigarettes    Start date: 09/16/1988    Quit date: 09/16/2018    Years since quitting: 6.1   Smokeless tobacco: Never  Vaping Use   Vaping status: Never Used  Substance Use Topics  Alcohol use: Yes    Comment: social   Drug use: Never  [2]  Allergies Allergen Reactions   Prednisone Other (See Comments)    Felt like she was crazy if on it more than 1 week   Codeine Other (See Comments)    Causes migraine--patient requests this be kept on allergy list   "

## 2024-10-31 NOTE — Progress Notes (Addendum)
 " PROGRESS NOTE    Norma Colon  FMW:[id number] DOB: 06-11-1961 DOA: 10/28/2024 PCP: Mat Browning, MD  Brief Narrative: 64 year old female with a history of rectal cancer with mets to the liver in remission restless leg syndrome right ureteral injury chronic anxiety depression chronic low back pain status post right ureteral stent placement admitted with right flank pain and shaking chills and fever.  She had a temp of 102.5 prior to coming into the ER associated with right CVA tenderness and dysuria.  In the ER she was found to be tachypneic tachycardic with a fever and UA was positive for pyuria and she was started on IV antibiotics.  In the ED she received vancomycin  and Flagyl  and cefepime . CT abdomen and pelvis showed no acute abnormality or abscess or obstructive uropathy.  Stable hepatic mets no new liver lesions.  Cholelithiasis seen.  No CBD dilatation no inflammatory stranding.  Assessment & Plan:   Principal Problem:   Right flank pain  #1  Fever patient admitted with right flank pain fever and chills Tmax of 101.1 down from 102.8.  On admission with right CVA tenderness and dysuria.  Her UA is consistent with UTI however urine culture did not show any growth and still spiking temp on IV cefepime . She denies any respiratory symptoms or diarrhea and no other focus of infection noted at this time.  Blood cultures pending. On admission she had lactic acidosis which resolved with IV fluids and IV antibiotics. Patient has history of right ureter injury and had Foley catheter placed at 1 time which she does not have anymore. She had all her teeth removed surgically on Monday 19th and a gyn procedure Thursday 22nd Since we dont have a source of infection and patient still spiking fever on cefepime  will check MRI thoracic spine (still with cva tenderness)CT maxillofacial. ID Consulted  #2 AKI improving with IV fluids avoid dehydration hypotension nephrotoxic agents.  Follow-up labs  as needed.  #3 history of anxiety and depression continue home meds  #4 history of restless leg syndrome continue home meds  #5 history of right rectal cancer with mets to the liver in remission discussed with Dr. Cloretta Mall tumor fever.  # constipation with large stool burden dulcolax,miralax    Estimated body mass index is 32.23 kg/m as calculated from the following:   Height as of this encounter: 5' 8 (1.727 m).   Weight as of this encounter: 96.2 kg.  DVT prophylaxis: lovenox  Code Status: full Family Communication:none Disposition Plan:  Status is: Inpatient Remains inpatient appropriate because: fever   Consultants:  onc  Procedures: none Antimicrobials:none  Subjective: Tmax 101.1 from 102.8 Still having chills flank pain  Says she gets fever every 4 hrs..  Objective: Vitals:   10/30/24 0942 10/30/24 1330 10/30/24 2031 10/31/24 0459  BP: 111/65 130/64 115/66 117/64  Pulse: 73 74 80 84  Resp:  18 18 15   Temp: 98.1 F (36.7 C) 98 F (36.7 C) 99 F (37.2 C) 99.3 F (37.4 C)  TempSrc:   Oral   SpO2: 92% 94%  (!) 89%  Weight:      Height:        Intake/Output Summary (Last 24 hours) at 10/31/2024 1121 Last data filed at 10/31/2024 1026 Gross per 24 hour  Intake 640.17 ml  Output --  Net 640.17 ml   Filed Weights   10/28/24 1838 10/28/24 2108  Weight: 97.5 kg 96.2 kg    Examination:  General exam: Appears in nad  Respiratory  system: Clear to auscultation. Respiratory effort normal. Cardiovascular system: reg Gastrointestinal system: Abdomen is nondistended, soft and nontender. No organomegaly or masses felt. Normal bowel sounds heard. Right CVA tender Central nervous system: Alert and oriented. No focal neurological deficits. Extremities: no edema   Data Reviewed: I have personally reviewed following labs and imaging studies  CBC: Recent Labs  Lab 10/28/24 1923 10/28/24 1941 10/29/24 0115 10/30/24 0502  WBC 11.6*  --  10.2 7.9   NEUTROABS 9.2*  --  8.5*  --   HGB 13.4 13.9 11.4* 10.7*  HCT 41.1 41.0 33.9* 33.5*  MCV 93.0  --  93.6 94.9  PLT 206  --  159 143*   Basic Metabolic Panel: Recent Labs  Lab 10/28/24 1923 10/28/24 1941 10/29/24 0115 10/30/24 0502  NA 137 138 137 136  K 3.4* 4.0 4.1 3.7  CL 102 101 106 104  CO2 21*  --  21* 21*  GLUCOSE 87 84 81 76  BUN 25* 32* 23 18  CREATININE 1.63* 1.80* 1.53* 1.33*  CALCIUM  9.3  --  8.2* 8.6*  MG  --   --  2.0  --   PHOS  --   --  4.2  --    GFR: Estimated Creatinine Clearance: 53.9 mL/min (A) (by C-G formula based on SCr of 1.33 mg/dL (H)). Liver Function Tests: Recent Labs  Lab 10/28/24 1923 10/29/24 0115 10/30/24 0502  AST 45* 32 23  ALT 52* 40 29  ALKPHOS 133* 105 94  BILITOT 1.7* 1.2 0.7  PROT 7.5 5.9* 5.8*  ALBUMIN 3.9 3.1* 2.9*   No results for input(s): LIPASE, AMYLASE in the last 168 hours. No results for input(s): AMMONIA in the last 168 hours. Coagulation Profile: Recent Labs  Lab 10/29/24 0115  INR 1.5*   Cardiac Enzymes: No results for input(s): CKTOTAL, CKMB, CKMBINDEX, TROPONINI in the last 168 hours. BNP (last 3 results) No results for input(s): PROBNP in the last 8760 hours. HbA1C: No results for input(s): HGBA1C in the last 72 hours. CBG: No results for input(s): GLUCAP in the last 168 hours. Lipid Profile: No results for input(s): CHOL, HDL, LDLCALC, TRIG, CHOLHDL, LDLDIRECT in the last 72 hours. Thyroid  Function Tests: No results for input(s): TSH, T4TOTAL, FREET4, T3FREE, THYROIDAB in the last 72 hours. Anemia Panel: No results for input(s): VITAMINB12, FOLATE, FERRITIN, TIBC, IRON, RETICCTPCT in the last 72 hours. Sepsis Labs: Recent Labs  Lab 10/28/24 1938 10/28/24 2056  LATICACIDVEN 2.6* 0.7    Recent Results (from the past 240 hours)  Blood Culture (routine x 2)     Status: None (Preliminary result)   Collection Time: 10/28/24  7:20 PM    Specimen: BLOOD RIGHT WRIST  Result Value Ref Range Status   Specimen Description   Final    BLOOD RIGHT WRIST Performed at [facility], [address], [place], [place] [address]    Special Requests   Final    BOTTLES DRAWN AEROBIC AND ANAEROBIC Blood Culture adequate volume Performed at [facility], [address], [place], [place] [address]    Culture   Final    NO GROWTH 2 DAYS Performed at [facility], [address] [place], [place] [address]    Report Status PENDING  Incomplete  Blood Culture (routine x 2)     Status: None (Preliminary result)   Collection Time: 10/28/24  7:20 PM   Specimen: Left Antecubital; Blood  Result Value Ref Range Status   Specimen  Description   Final    LEFT ANTECUBITAL Performed at [facility], [address] [place], [place] [address]    Special Requests   Final    BOTTLES DRAWN AEROBIC AND ANAEROBIC Blood Culture adequate volume Performed at [facility], [address], [place], [place] [address]    Culture   Final    NO GROWTH 2 DAYS Performed at [facility], [address] [place], [place] [address]    Report Status PENDING  Incomplete  Resp panel by RT-PCR (RSV, Flu A&B, Covid) Peripheral     Status: None   Collection Time: 10/28/24  7:39 PM   Specimen: Peripheral; Nasal Swab  Result Value Ref Range Status   SARS Coronavirus 2 by RT PCR NEGATIVE NEGATIVE Final    Comment: (NOTE) SARS-CoV-2 target nucleic acids are NOT DETECTED.  The SARS-CoV-2 RNA is generally detectable in upper respiratory specimens during the acute phase of infection. The lowest concentration of SARS-CoV-2 viral copies this assay can detect is 138 copies/mL. A negative result does not preclude SARS-Cov-2 infection and should not be used as the sole basis for treatment or other patient management decisions. A negative result may occur with  improper specimen  collection/handling, submission of specimen other than nasopharyngeal swab, presence of viral mutation(s) within the areas targeted by this assay, and inadequate number of viral copies(<138 copies/mL). A negative result must be combined with clinical observations, patient history, and epidemiological information. The expected result is Negative.  Fact Sheet for Patients:  bloggercourse.com  Fact Sheet for Healthcare Providers:  seriousbroker.it  This test is no t yet approved or cleared by the [place]  FDA and  has been authorized for detection and/or diagnosis of SARS-CoV-2 by FDA under an Emergency Use Authorization (EUA). This EUA will remain  in effect (meaning this test can be used) for the duration of the COVID-19 declaration under Section 564(b)(1) of the Act, 21 U.S.C.section 360bbb-3(b)(1), unless the authorization is terminated  or revoked sooner.       Influenza A by PCR NEGATIVE NEGATIVE Final   Influenza B by PCR NEGATIVE NEGATIVE Final    Comment: (NOTE) The Xpert Xpress SARS-CoV-2/FLU/RSV plus assay is intended as an aid in the diagnosis of influenza from Nasopharyngeal swab specimens and should not be used as a sole basis for treatment. Nasal washings and aspirates are unacceptable for Xpert Xpress SARS-CoV-2/FLU/RSV testing.  Fact Sheet for Patients: bloggercourse.com  Fact Sheet for Healthcare Providers: seriousbroker.it  This test is not yet approved or cleared by the [place]  FDA and has been authorized for detection and/or diagnosis of SARS-CoV-2 by FDA under an Emergency Use Authorization (EUA). This EUA will remain in effect (meaning this test can be used) for the duration of the COVID-19 declaration under Section 564(b)(1) of the Act, 21 U.S.C. section 360bbb-3(b)(1), unless the authorization is terminated or revoked.     Resp Syncytial  Virus by PCR NEGATIVE NEGATIVE Final    Comment: (NOTE) Fact Sheet for Patients: bloggercourse.com  Fact Sheet for Healthcare Providers: seriousbroker.it  This test is not yet approved or cleared by the [place]  FDA and has been authorized for detection and/or diagnosis of SARS-CoV-2 by FDA under an Emergency Use Authorization (EUA). This EUA will remain in effect (meaning this test can be used) for the duration of the COVID-19 declaration under Section 564(b)(1) of the Act, 21 U.S.C. section 360bbb-3(b)(1), unless the authorization is terminated or revoked.  Performed at [facility], [address], [place], [place] [address]   Urine Culture     Status: None   Collection Time: 10/28/24 11:09 PM   Specimen: Urine, Random  Result Value Ref Range Status   Specimen Description   Final    URINE, RANDOM Performed at [facility], [address], [place], [place] [address]    Special Requests   Final    NONE Reflexed from [id number] Performed at [facility], [address], [place], [place] [address]    Culture   Final    NO GROWTH Performed at [facility], [address], [place], [place] [address]    Report Status 10/30/2024 FINAL  Final         Radiology Studies: No results found.    Scheduled Meds:  alprazolam   2 mg Oral q morning   bisacodyl   10 mg Oral Daily   buPROPion   300 mg Oral q morning   chlorhexidine   15 mL Mouth/Throat BID   enoxaparin  (LOVENOX ) injection  40 mg Subcutaneous Q24H   polyethylene glycol  17 g Oral BID   rOPINIRole   0.25 mg Oral QHS   Continuous Infusions:  ceFEPime  (MAXIPIME ) IV 2 g (10/31/24 0831)     LOS: 3 days     Almarie KANDICE Hoots, MD 10/31/2024, 11:21 AM   "

## 2024-10-31 NOTE — Plan of Care (Signed)

## 2024-11-01 ENCOUNTER — Inpatient Hospital Stay (HOSPITAL_COMMUNITY)

## 2024-11-01 DIAGNOSIS — N179 Acute kidney failure, unspecified: Secondary | ICD-10-CM | POA: Diagnosis not present

## 2024-11-01 DIAGNOSIS — I82611 Acute embolism and thrombosis of superficial veins of right upper extremity: Secondary | ICD-10-CM

## 2024-11-01 DIAGNOSIS — R509 Fever, unspecified: Secondary | ICD-10-CM | POA: Diagnosis not present

## 2024-11-01 DIAGNOSIS — K08109 Complete loss of teeth, unspecified cause, unspecified class: Secondary | ICD-10-CM | POA: Diagnosis not present

## 2024-11-01 DIAGNOSIS — F32A Depression, unspecified: Secondary | ICD-10-CM

## 2024-11-01 DIAGNOSIS — K5900 Constipation, unspecified: Secondary | ICD-10-CM | POA: Diagnosis not present

## 2024-11-01 DIAGNOSIS — F419 Anxiety disorder, unspecified: Secondary | ICD-10-CM | POA: Diagnosis not present

## 2024-11-01 DIAGNOSIS — R10A0 Flank pain, unspecified side: Secondary | ICD-10-CM | POA: Diagnosis not present

## 2024-11-01 DIAGNOSIS — G2581 Restless legs syndrome: Secondary | ICD-10-CM | POA: Diagnosis not present

## 2024-11-01 DIAGNOSIS — G8929 Other chronic pain: Secondary | ICD-10-CM | POA: Diagnosis not present

## 2024-11-01 DIAGNOSIS — R10A1 Flank pain, right side: Secondary | ICD-10-CM | POA: Diagnosis not present

## 2024-11-01 LAB — CBC
HCT: 32.8 % — ABNORMAL LOW (ref 36.0–46.0)
Hemoglobin: 10.8 g/dL — ABNORMAL LOW (ref 12.0–15.0)
MCH: 30.6 pg (ref 26.0–34.0)
MCHC: 32.9 g/dL (ref 30.0–36.0)
MCV: 92.9 fL (ref 80.0–100.0)
Platelets: 185 10*3/uL (ref 150–400)
RBC: 3.53 MIL/uL — ABNORMAL LOW (ref 3.87–5.11)
RDW: 13.2 % (ref 11.5–15.5)
WBC: 4.8 10*3/uL (ref 4.0–10.5)
nRBC: 0 % (ref 0.0–0.2)

## 2024-11-01 LAB — COMPREHENSIVE METABOLIC PANEL WITH GFR
ALT: 30 U/L (ref 0–44)
AST: 26 U/L (ref 15–41)
Albumin: 3.2 g/dL — ABNORMAL LOW (ref 3.5–5.0)
Alkaline Phosphatase: 111 U/L (ref 38–126)
Anion gap: 10 (ref 5–15)
BUN: 13 mg/dL (ref 8–23)
CO2: 24 mmol/L (ref 22–32)
Calcium: 9 mg/dL (ref 8.9–10.3)
Chloride: 105 mmol/L (ref 98–111)
Creatinine, Ser: 1.03 mg/dL — ABNORMAL HIGH (ref 0.44–1.00)
GFR, Estimated: >60 mL/min
Glucose, Bld: 79 mg/dL (ref 70–99)
Potassium: 4.2 mmol/L (ref 3.5–5.1)
Sodium: 138 mmol/L (ref 135–145)
Total Bilirubin: 0.6 mg/dL (ref 0.0–1.2)
Total Protein: 6.4 g/dL — ABNORMAL LOW (ref 6.5–8.1)

## 2024-11-01 LAB — MISC LABCORP TEST (SEND OUT): Labcorp test code: 83935

## 2024-11-01 LAB — HEPATITIS B SURFACE ANTIBODY, QUANTITATIVE: Hep B S AB Quant (Post): <3.5 m[IU]/mL — ABNORMAL LOW

## 2024-11-01 LAB — SEDIMENTATION RATE: Sed Rate: 118 mm/h — ABNORMAL HIGH (ref 0–22)

## 2024-11-01 LAB — HCV INTERPRETATION

## 2024-11-01 LAB — C-REACTIVE PROTEIN: CRP: 17.4 mg/dL — ABNORMAL HIGH

## 2024-11-01 LAB — HCV AB W REFLEX TO QUANT PCR: HCV Ab: NONREACTIVE

## 2024-11-01 MED ORDER — SENNOSIDES-DOCUSATE SODIUM 8.6-50 MG PO TABS
1.0000 | ORAL_TABLET | Freq: Two times a day (BID) | ORAL | Status: DC
  Administered 2024-11-01 – 2024-11-03 (×5): 1 via ORAL
  Filled 2024-11-01 (×5): qty 1

## 2024-11-01 MED ORDER — SORBITOL 70 % SOLN
30.0000 mL | Status: AC
  Filled 2024-11-01 (×3): qty 30

## 2024-11-01 NOTE — Plan of Care (Signed)

## 2024-11-01 NOTE — Progress Notes (Signed)
 "       Subjective:  She is c/of of worsening right flank pain   Antibiotics:  Anti-infectives (From admission, onward)    Start     Dose/Rate Route Frequency Ordered Stop   10/29/24 0800  ceFEPIme  (MAXIPIME ) 2 g in sodium chloride  0.9 % 100 mL IVPB  Status:  Discontinued        2 g 200 mL/hr over 30 Minutes Intravenous Every 12 hours 10/29/24 0057 11/01/24 0948   10/28/24 1945  ceFEPIme  (MAXIPIME ) 2 g in sodium chloride  0.9 % 100 mL IVPB        2 g 200 mL/hr over 30 Minutes Intravenous  Once 10/28/24 1944 10/28/24 2026   10/28/24 1945  metroNIDAZOLE  (FLAGYL ) IVPB 500 mg        500 mg 100 mL/hr over 60 Minutes Intravenous  Once 10/28/24 1944 10/28/24 2059   10/28/24 1945  vancomycin  (VANCOCIN ) IVPB 1000 mg/200 mL premix        1,000 mg 200 mL/hr over 60 Minutes Intravenous  Once 10/28/24 1944 10/28/24 2150       Medications: Scheduled Meds:  alprazolam   2 mg Oral q morning   bisacodyl   10 mg Oral Daily   buPROPion   300 mg Oral q morning   chlorhexidine   15 mL Mouth/Throat BID   enoxaparin  (LOVENOX ) injection  40 mg Subcutaneous Q24H   polyethylene glycol  17 g Oral BID   rOPINIRole   0.25 mg Oral QHS   senna-docusate  1 tablet Oral BID   sorbitol   30 mL Oral Q3H   Continuous Infusions: PRN Meds:.cyclobenzaprine , HYDROmorphone  (DILAUDID ) injection, melatonin, oxyCODONE , prochlorperazine     Objective: Weight change:   Intake/Output Summary (Last 24 hours) at 11/01/2024 1702 Last data filed at 11/01/2024 1048 Gross per 24 hour  Intake 120 ml  Output --  Net 120 ml   Blood pressure 118/86, pulse 85, temperature 98.2 F (36.8 C), temperature source Oral, resp. rate 20, height 5' 8 (1.727 m), weight 96.2 kg, SpO2 99%. Temp:  [98.1 F (36.7 C)-98.5 F (36.9 C)] 98.2 F (36.8 C) (01/28 1209) Pulse Rate:  [73-85] 85 (01/28 1209) Resp:  [18-20] 20 (01/28 0439) BP: (118-125)/(68-86) 118/86 (01/28 1209) SpO2:  [99 %] 99 % (01/28 1209)  Physical Exam: Physical  Exam Constitutional:      General: She is not in acute distress.    Appearance: She is well-developed. She is not diaphoretic.  HENT:     Head: Normocephalic and atraumatic.     Right Ear: External ear normal.     Left Ear: External ear normal.     Mouth/Throat:     Pharynx: No oropharyngeal exudate.  Eyes:     General: No scleral icterus.    Conjunctiva/sclera: Conjunctivae normal.     Pupils: Pupils are equal, round, and reactive to light.  Cardiovascular:     Rate and Rhythm: Normal rate and regular rhythm.  Pulmonary:     Effort: Pulmonary effort is normal. No respiratory distress.     Breath sounds: No wheezing.  Abdominal:     General: There is no distension.     Palpations: Abdomen is soft.  Lymphadenopathy:     Cervical: No cervical adenopathy.  Skin:    General: Skin is warm and dry.     Coloration: Skin is not pale.     Findings: No erythema or rash.  Neurological:     General: No focal deficit present.     Mental Status: She is alert  and oriented to person, place, and time.     Motor: No abnormal muscle tone.     Coordination: Coordination normal.  Psychiatric:        Mood and Affect: Mood normal.        Behavior: Behavior normal.        Thought Content: Thought content normal.        Judgment: Judgment normal.      CBC:    BMET Recent Labs    10/30/24 0502 11/01/24 0558  NA 136 138  K 3.7 4.2  CL 104 105  CO2 21* 24  GLUCOSE 76 79  BUN 18 13  CREATININE 1.33* 1.03*  CALCIUM  8.6* 9.0     Liver Panel  Recent Labs    10/30/24 0502 11/01/24 0558  PROT 5.8* 6.4*  ALBUMIN 2.9* 3.2*  AST 23 26  ALT 29 30  ALKPHOS 94 111  BILITOT 0.7 0.6       Sedimentation Rate Recent Labs    11/01/24 0558  ESRSEDRATE 118*   C-Reactive Protein Recent Labs    11/01/24 0558  CRP 17.4*    Micro Results: Recent Results (from the past 720 hours)  Blood Culture (routine x 2)     Status: None (Preliminary result)   Collection Time: 10/28/24   7:20 PM   Specimen: BLOOD RIGHT WRIST  Result Value Ref Range Status   Specimen Description   Final    BLOOD RIGHT WRIST Performed at [facility], [address] [place], [place] [address]    Special Requests   Final    BOTTLES DRAWN AEROBIC AND ANAEROBIC Blood Culture adequate volume Performed at [facility], [address], [place], [place] [address]    Culture   Final    NO GROWTH 3 DAYS Performed at [facility], [address] [place], [place] [address]    Report Status PENDING  Incomplete  Blood Culture (routine x 2)     Status: None (Preliminary result)   Collection Time: 10/28/24  7:20 PM   Specimen: Left Antecubital; Blood  Result Value Ref Range Status   Specimen Description   Final    LEFT ANTECUBITAL Performed at [facility], [address], [place], [place] [address]    Special Requests   Final    BOTTLES DRAWN AEROBIC AND ANAEROBIC Blood Culture adequate volume Performed at [facility], [address], [place], [place] [address]    Culture   Final    NO GROWTH 3 DAYS Performed at [facility], [address] [place], [place] [address]    Report Status PENDING  Incomplete  Resp panel by RT-PCR (RSV, Flu A&B, Covid) Peripheral     Status: None   Collection Time: 10/28/24  7:39 PM   Specimen: Peripheral; Nasal Swab  Result Value Ref Range Status   SARS Coronavirus 2 by RT PCR NEGATIVE NEGATIVE Final    Comment: (NOTE) SARS-CoV-2 target nucleic acids are NOT DETECTED.  The SARS-CoV-2 RNA is generally detectable in upper respiratory specimens during the acute phase of infection. The lowest concentration of SARS-CoV-2 viral copies this assay can detect is 138 copies/mL. A negative result does not preclude SARS-Cov-2 infection and should not be used as the sole basis for treatment or other patient management decisions. A negative result may occur with  improper specimen  collection/handling, submission of specimen other than nasopharyngeal swab, presence of viral mutation(s) within the areas targeted  by this assay, and inadequate number of viral copies(<138 copies/mL). A negative result must be combined with clinical observations, patient history, and epidemiological information. The expected result is Negative.  Fact Sheet for Patients:  bloggercourse.com  Fact Sheet for Healthcare Providers:  seriousbroker.it  This test is no t yet approved or cleared by the [place]  FDA and  has been authorized for detection and/or diagnosis of SARS-CoV-2 by FDA under an Emergency Use Authorization (EUA). This EUA will remain  in effect (meaning this test can be used) for the duration of the COVID-19 declaration under Section 564(b)(1) of the Act, 21 U.S.C.section 360bbb-3(b)(1), unless the authorization is terminated  or revoked sooner.       Influenza A by PCR NEGATIVE NEGATIVE Final   Influenza B by PCR NEGATIVE NEGATIVE Final    Comment: (NOTE) The Xpert Xpress SARS-CoV-2/FLU/RSV plus assay is intended as an aid in the diagnosis of influenza from Nasopharyngeal swab specimens and should not be used as a sole basis for treatment. Nasal washings and aspirates are unacceptable for Xpert Xpress SARS-CoV-2/FLU/RSV testing.  Fact Sheet for Patients: bloggercourse.com  Fact Sheet for Healthcare Providers: seriousbroker.it  This test is not yet approved or cleared by the [place]  FDA and has been authorized for detection and/or diagnosis of SARS-CoV-2 by FDA under an Emergency Use Authorization (EUA). This EUA will remain in effect (meaning this test can be used) for the duration of the COVID-19 declaration under Section 564(b)(1) of the Act, 21 U.S.C. section 360bbb-3(b)(1), unless the authorization is terminated or revoked.     Resp Syncytial  Virus by PCR NEGATIVE NEGATIVE Final    Comment: (NOTE) Fact Sheet for Patients: bloggercourse.com  Fact Sheet for Healthcare Providers: seriousbroker.it  This test is not yet approved or cleared by the [place]  FDA and has been authorized for detection and/or diagnosis of SARS-CoV-2 by FDA under an Emergency Use Authorization (EUA). This EUA will remain in effect (meaning this test can be used) for the duration of the COVID-19 declaration under Section 564(b)(1) of the Act, 21 U.S.C. section 360bbb-3(b)(1), unless the authorization is terminated or revoked.  Performed at [facility], [address], [place], [place] [address]   Urine Culture     Status: None   Collection Time: 10/28/24 11:09 PM   Specimen: Urine, Random  Result Value Ref Range Status   Specimen Description   Final    URINE, RANDOM Performed at [facility], [address], [place], [place] [address]    Special Requests   Final    NONE Reflexed from [id number] Performed at [facility], [address] [place], [place] [address]    Culture   Final    NO GROWTH Performed at [facility], [address] [place], [place] [address]    Report Status 10/30/2024 FINAL  Final  Urine Culture (for pregnant, neutropenic or urologic patients or patients with an indwelling urinary catheter)     Status: None   Collection Time: 10/30/24  3:07 PM   Specimen: Urine, Clean Catch  Result Value Ref Range Status   Specimen Description   Final    URINE, CLEAN CATCH Performed at [facility], [address], [place], [place] [address]    Special Requests   Final    NONE Performed at [facility], [address] [place], [place] [address]    Culture   Final    NO GROWTH Performed at  [facility], [address], [place], [place] [address]    Report Status  10/31/2024 FINAL  Final    Studies/Results: VAS US  UPPER EXTREMITY VENOUS DUPLEX Result Date: 11/01/2024 UPPER VENOUS STUDY  Patient Name:  Shametra CUMMINGS Gramling  Date of Exam:   11/01/2024 Medical Rec #: [id number]             Accession #:    [id number] Date of Birth: 1961/03/10             Patient Gender: F Patient Age:   64 years Exam Location:  [facility] Procedure:      VAS US  UPPER EXTREMITY VENOUS DUPLEX Referring Phys: Tynan Boesel VAN DAM --------------------------------------------------------------------------------  Indications: Fever of unknown origin Limitations: Poor ultrasound/tissue interface. Comparison Study: No previous exams Performing Technologist: Jody Hill RVT, RDMS  Examination Guidelines: A complete evaluation includes B-mode imaging, spectral Doppler, color Doppler, and power Doppler as needed of all accessible portions of each vessel. Bilateral testing is considered an integral part of a complete examination. Limited examinations for reoccurring indications may be performed as noted.  Right Findings: +----------+------------+---------+-----------+----------+-------+ RIGHT     CompressiblePhasicitySpontaneousPropertiesSummary +----------+------------+---------+-----------+----------+-------+ IJV           Full       Yes       Yes                      +----------+------------+---------+-----------+----------+-------+ Subclavian    Full       Yes       Yes                      +----------+------------+---------+-----------+----------+-------+ Axillary      Full       Yes       Yes                      +----------+------------+---------+-----------+----------+-------+ Brachial      Full       Yes       Yes                      +----------+------------+---------+-----------+----------+-------+ Radial        Full                                          +----------+------------+---------+-----------+----------+-------+ Ulnar         Full                                           +----------+------------+---------+-----------+----------+-------+ Cephalic      Full                                          +----------+------------+---------+-----------+----------+-------+ Basilic       Full       Yes       Yes                      +----------+------------+---------+-----------+----------+-------+  Left Findings: +----------+------------+---------+-----------+----------+-------------------+ LEFT      CompressiblePhasicitySpontaneousProperties      Summary       +----------+------------+---------+-----------+----------+-------------------+ IJV  Full       Yes       Yes                                  +----------+------------+---------+-----------+----------+-------------------+ Subclavian    Full       Yes       Yes                                  +----------+------------+---------+-----------+----------+-------------------+ Axillary      Full       Yes       Yes                                  +----------+------------+---------+-----------+----------+-------------------+ Brachial      Full       Yes       Yes                                  +----------+------------+---------+-----------+----------+-------------------+ Radial        Full                                                      +----------+------------+---------+-----------+----------+-------------------+ Ulnar                                               not well visualized +----------+------------+---------+-----------+----------+-------------------+ Cephalic      Full                                                      +----------+------------+---------+-----------+----------+-------------------+ Basilic       Full       Yes       Yes                                  +----------+------------+---------+-----------+----------+-------------------+ Limited visualization of radial and ulnar veins due to  wrapped IV placement. Ulnar not well visualized due to size. Visualized areas appear patent. Basilic vein is patent, however a branch off basilic at [facility] level is thrombosed just proximal to IV placement.  Summary: No evidence of deep vein thrombosis involving the right and left upper extremities. Right: No evidence of superficial vein thrombosis in the upper extremity.  Left: Findings consistent with age indeterminate superficial vein thrombosis involving a branch ofthe left basilic vein just proximal to IV placement.  *See table(s) above for measurements and observations.  Diagnosing physician: Debby Robertson Electronically signed by Debby Robertson on 11/01/2024 at 4:11:59 PM.    Final    VAS US  LOWER EXTREMITY VENOUS (DVT) Result Date: 11/01/2024  Lower Venous DVT Study Patient Name:  [facility]  Date of Exam:   11/01/2024 Medical Rec #: [id number]  Accession #:    [id number] Date of Birth: [birth year]             Patient Gender: F Patient Age:   78 years Exam Location[facility]  [facility] Procedure:      VAS US  LOWER EXTREMITY VENOUS (DVT) Referring Phys: Jorgen Wolfinger VAN DAM --------------------------------------------------------------------------------  Indications: Fever of unknown origin.  Comparison Study: No previous exams Performing Technologist: Jody Hill RVT, RDMS  Examination Guidelines: A complete evaluation includes B-mode imaging, spectral Doppler, color Doppler, and power Doppler as needed of all accessible portions of each vessel. Bilateral testing is considered an integral part of a complete examination. Limited examinations for reoccurring indications may be performed as noted. The reflux portion of the exam is performed with the patient in reverse Trendelenburg.  +---------+---------------+---------+-----------+----------+--------------+ RIGHT    CompressibilityPhasicitySpontaneityPropertiesThrombus Aging  +---------+---------------+---------+-----------+----------+--------------+ CFV      Full           Yes      Yes                                 +---------+---------------+---------+-----------+----------+--------------+ SFJ      Full                                                        +---------+---------------+---------+-----------+----------+--------------+ FV Prox  Full           Yes      Yes                                 +---------+---------------+---------+-----------+----------+--------------+ FV Mid   Full           Yes      Yes                                 +---------+---------------+---------+-----------+----------+--------------+ FV DistalFull           Yes      Yes                                 +---------+---------------+---------+-----------+----------+--------------+ PFV      Full                                                        +---------+---------------+---------+-----------+----------+--------------+ POP      Full           Yes      Yes                                 +---------+---------------+---------+-----------+----------+--------------+ PTV      Full                                                        +---------+---------------+---------+-----------+----------+--------------+  PERO     Full                                                        +---------+---------------+---------+-----------+----------+--------------+   +---------+---------------+---------+-----------+-------------+--------------+ LEFT     CompressibilityPhasicitySpontaneityProperties   Thrombus Aging +---------+---------------+---------+-----------+-------------+--------------+ CFV      Full           Yes      Yes                                    +---------+---------------+---------+-----------+-------------+--------------+ SFJ      Full                                                            +---------+---------------+---------+-----------+-------------+--------------+ FV Prox  Full           Yes      Yes                                    +---------+---------------+---------+-----------+-------------+--------------+ FV Mid   Full           Yes      Yes                                    +---------+---------------+---------+-----------+-------------+--------------+ FV DistalFull           Yes      Yes                                    +---------+---------------+---------+-----------+-------------+--------------+ PFV      Full                                                           +---------+---------------+---------+-----------+-------------+--------------+ POP      Full           Yes      Yes        rouleaux flow               +---------+---------------+---------+-----------+-------------+--------------+ PTV      Full                                                           +---------+---------------+---------+-----------+-------------+--------------+ PERO     Full                                                           +---------+---------------+---------+-----------+-------------+--------------+  Summary: BILATERAL: - No evidence of deep vein thrombosis seen in the lower extremities, bilaterally. -No evidence of popliteal cyst, bilaterally.   *See table(s) above for measurements and observations. Electronically signed by Debby Robertson on 11/01/2024 at 4:05:59 PM.    Final    CT MAXILLOFACIAL W CONTRAST Result Date: 10/31/2024 CLINICAL DATA:  Initial evaluation for nasal abscess. EXAM: CT MAXILLOFACIAL WITH CONTRAST TECHNIQUE: Multidetector CT imaging of the maxillofacial structures was performed with intravenous contrast. Multiplanar CT image reconstructions were also generated. RADIATION DOSE REDUCTION: This exam was performed according to the departmental dose-optimization program which includes automated exposure control, adjustment of  the mA and/or kV according to patient size and/or use of iterative reconstruction technique. CONTRAST:  75mL OMNIPAQUE  IOHEXOL  300 MG/ML  SOLN COMPARISON:  None Available. FINDINGS: Osseous: No acute osseous abnormality about the face. No discrete or worrisome osseous lesions. Sequelae of recent total dental extraction noted. Orbits: Globes and orbital soft tissues within normal limits. No evidence for intraorbital or postseptal cellulitis. Sinuses: Mild mucosal thickening about the inferior aspects of the maxillary sinuses bilaterally. Paranasal sinuses are otherwise largely clear. Mild right-to-left nasal septal deviation noted. Mastoid air cells and middle ear cavities are clear. Soft tissues: No significant soft tissue swelling or inflammatory changes seen about the face. No discrete abscess or drainable fluid collection. Salivary glands including the parotid and submandibular glands are within normal limits. No adenopathy within the visualized upper neck. Mild atheromatous plaque noted about the left carotid bulb without stenosis. Limited intracranial: Unremarkable. IMPRESSION: 1. Negative maxillofacial CT. No significant soft tissue swelling or inflammatory changes seen about the face. No discrete abscess or drainable fluid collection. 2. Sequelae of recent total dental extraction. Electronically Signed   By: Morene Hoard M.D.   On: 10/31/2024 18:33   CT HEAD WO CONTRAST ( ) Result Date: 10/31/2024 CLINICAL DATA:  Initial evaluation for metastatic disease, migraine headache. EXAM: CT HEAD WITHOUT CONTRAST TECHNIQUE: Contiguous axial images were obtained from the base of the skull through the vertex without intravenous contrast. RADIATION DOSE REDUCTION: This exam was performed according to the departmental dose-optimization program which includes automated exposure control, adjustment of the mA and/or kV according to patient size and/or use of iterative reconstruction technique. COMPARISON:  None  available. FINDINGS: Brain: Cerebral volume within normal limits. No acute intracranial hemorrhage. No acute large vessel territory infarct. No mass lesion or evidence for intracranial metastatic disease on this noncontrast examination. No mass effect or midline shift. No hydrocephalus or extra-axial fluid collection. Vascular: No abnormal hyperdense vessel. Skull: Scalp soft tissues within normal limits. Calvarium intact. No visible focal osseous lesions. Sinuses/Orbits: Globes orbital soft tissues within normal limits. Paranasal sinuses and mastoid air cells are largely clear. Other: None. IMPRESSION: Normal head CT. No acute intracranial abnormality. No evidence for intracranial metastatic disease on this noncontrast examination. Electronically Signed   By: Morene Hoard M.D.   On: 10/31/2024 18:27   MR THORACIC SPINE W WO CONTRAST Result Date: 10/31/2024 EXAM: MRI THORACIC SPINE WITH AND WITHOUT INTRAVENOUS CONTRAST 10/31/2024 11:48:33 AM TECHNIQUE: Multiplanar multisequence MRI of the thoracic spine was performed with and without the administration of intravenous contrast. COMPARISON: Restaging CT chest, abdomen, and pelvis 10/01/2023. Recent CT abdomen and pelvis 10/29/2023. CLINICAL HISTORY: 64 year old female. Mid-back pain. Recent sepsis, flank pain, and metastatic colon cancer. FINDINGS: Normal thoracic segmentation on the comparison CT. Limited sagittal imaging of the cervical spine is negative aside from evidence of lower cervical disc and endplate degeneration including at C5-C6 and  C6-C7. BONES AND ALIGNMENT: Normal alignment. Normal vertebral body heights. Small sclerotic bone island at the T5 vertebral body (series 18 image 10) appears stable since 2024 (no follow up imaging recommended). No thoracic marrow edema or vertebral enhancement. Background marrow signal is normal. SPINAL CORD: Normal spinal cord volume. Normal spinal cord signal. Capacious thoracic spinal canal. Normal conus  medullaris at T12-L1. No abnormal intradural enhancement. No dural thickening. SOFT TISSUES: Small bilateral layering pleural effusions (series 21 image 17), new from recent CT abdomen. Negative thoracic paraspinal soft tissues. Partially visible confluent posterior subcutaneous edema located posteriorly and beginning at the thoracolumbar junction continuing inferiorly (series 19 image 10). No adjacent paraspinal muscle edema. No associated abnormal soft tissue enhancement. DEGENERATIVE: Generally mild for age thoracic spine degeneration. No advanced disc degeneration. Small right paracentral disc bulge or protrusion at T12-L1 (series 20 image 37 and series 17 image 9). Mild to moderate lower thoracic posterior element hypertrophy including at T9-T10 and T10-T11. Small right paracentral disc bulge or protrusion at T12-L1 (series 20 image 37 and series 17 image 9). Moderate degenerative right side T9 and T10 neural foraminal stenosis appears chronic and related to facet hypertrophy. No thoracic spinal stenosis. IMPRESSION: 1. No acute or metastatic process identified in the thoracic spine. 2. Small bilateral pleural effusions, new from 10/29/2023 CT abdomen and pelvis. And partially visible posterior subcutaneous edema beginning at the thoracolumbar junction. 3. Generally mild for age thoracic spine degeneration, no thoracic spinal stenosis. Electronically signed by: Helayne Hurst MD 10/31/2024 01:08 PM EST RP Workstation: [id number]      Assessment/Plan:  INTERVAL HISTORY: all of yesterdays studies were unremarkable for infection, a superficial thrombus was seen on Duplex of LUE   Principal Problem:   Right flank pain Active Problems:   Acute kidney injury   Right ureteral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

## 2024-11-01 NOTE — Progress Notes (Addendum)
 " PROGRESS NOTE    Norma Colon  FMW:[id number] DOB: 03/09/1961 DOA: 10/28/2024 PCP: Mat Browning, MD    Chief Complaint  Patient presents with   Fever    Brief Narrative:  64 year old female with a history of rectal cancer with mets to the liver in remission restless leg syndrome right ureteral injury chronic anxiety depression chronic low back pain status post right ureteral stent placement admitted with right flank pain and shaking chills and fever.  She had a temp of 102.5 prior to coming into the ER associated with right CVA tenderness and dysuria.  In the ER she was found to be tachypneic tachycardic with a fever and UA was positive for pyuria and she was started on IV antibiotics.  In the ED she received vancomycin  and Flagyl  and cefepime . CT abdomen and pelvis showed no acute abnormality or abscess or obstructive uropathy.  Stable hepatic mets no new liver lesions.  Cholelithiasis seen.  No CBD dilatation no inflammatory stranding.   Assessment & Plan:   Principal Problem:   Right flank pain Active Problems:   Acute kidney injury   Right ureteral injury   FUO (fever of unknown origin)   Edentulous   Chronic flank pain   Constipation   Restless leg syndrome   Anxiety and depression   #1 fever - Patient noted with fever. -Patient presented with fever, right flank pain, fever chills with a Tmax of 101.1 down from 102.8. -Fever curve trending down. - Urinalysis done on admission consistent with UTI however urine cultures with no growth. - Patient noted to have been started on IV antibiotics. - Patient without any respiratory symptoms, no diarrhea. - Patient with a lactic acidosis which resolved with hydration and IV antibiotics. - Patient noted with history of right ureteral injury and had Foley catheter placed around 1 time which she does not have anymore. - Patient noted to have all her teeth surgically removed on 10/23/2024 and GYN procedure on Thursday,  10/26/2024. - CT head CT maxillofacial negative for any acute abnormalities. - MRI T-spine with no acute or metastatic process identified in the T-spine. - MRI of the L-spine and hip pending for further evaluation. -2D echo pending. - Patient seen in consultation by ID, IV antibiotics have been discontinued pending MRI of the L and T-spine. - ID following appreciate input and recommendations.  2.  AKI -Likely secondary to prerenal azotemia. - Improving with hydration.  3.  Anxiety/depression -Stable. - Continue home regimen Xanax , Wellbutrin .  4.  Constipation -Continue MiraLAX  twice daily, Senokot-S twice daily. - Sorbitol  p.o. x 1.  5.  RLS -Requip .  6.  History of rectal cancer with mets to the liver in remission - Dr. Alvia discussed case with Dr. Cloretta who does not feel patient likely has a tumor fever. - Outpatient follow-up with oncology.   DVT prophylaxis: Lovenox  Code Status: Full Family Communication: Updated patient.  No family at bedside. Disposition: Likely home when clinically improved and cleared by ID.  Status is: Inpatient Remains inpatient appropriate because: Severity of illness   Consultants:  ID: Dr. Fleeta Rothman 10/31/2024  Procedures:  CT abdomen and pelvis 10/28/2024 CT head 10/31/2024 CT maxillofacial 10/31/2024 Chest x-ray 10/28/2024 MRI T-spine 10/31/2024 Bilateral upper venous duplex 11/01/2024 Bilateral lower extremity venous duplex 11/01/2024  Antimicrobials:  Anti-infectives (From admission, onward)    Start     Dose/Rate Route Frequency Ordered Stop   10/29/24 0800  ceFEPIme  (MAXIPIME ) 2 g in sodium chloride  0.9 % 100 mL  IVPB  Status:  Discontinued        2 g 200 mL/hr over 30 Minutes Intravenous Every 12 hours 10/29/24 0057 11/01/24 0948   10/28/24 1945  ceFEPIme  (MAXIPIME ) 2 g in sodium chloride  0.9 % 100 mL IVPB        2 g 200 mL/hr over 30 Minutes Intravenous  Once 10/28/24 1944 10/28/24 2026   10/28/24 1945  metroNIDAZOLE  (FLAGYL )  IVPB 500 mg        500 mg 100 mL/hr over 60 Minutes Intravenous  Once 10/28/24 1944 10/28/24 2059   10/28/24 1945  vancomycin  (VANCOCIN ) IVPB 1000 mg/200 mL premix        1,000 mg 200 mL/hr over 60 Minutes Intravenous  Once 10/28/24 1944 10/28/24 2150         Subjective: Patient sitting up in room.  Still with complaints of right flank pain however overall states she is much improved than on admission.  Patient currently afebrile.  Tolerating current diet.  Denies any chest pain or shortness of breath.  States lower abdominal suprapubic pain has resolved.  Objective: Vitals:   10/31/24 1957 11/01/24 0439 11/01/24 1209 11/01/24 1921  BP: 124/68 125/77 118/86 137/79  Pulse: 81 73 85 78  Resp: 18 20  16   Temp: 98.1 F (36.7 C) 98.5 F (36.9 C) 98.2 F (36.8 C) 97.8 F (36.6 C)  TempSrc: Oral Oral Oral Oral  SpO2:   99% 98%  Weight:      Height:        Intake/Output Summary (Last 24 hours) at 11/01/2024 1928 Last data filed at 11/01/2024 1048 Gross per 24 hour  Intake 120 ml  Output --  Net 120 ml   Filed Weights   10/28/24 1838 10/28/24 2108  Weight: 97.5 kg 96.2 kg    Examination:  General exam: Appears calm and comfortable  Respiratory system: Clear to auscultation. Respiratory effort normal. Cardiovascular system: S1 & S2 heard, RRR. No JVD, murmurs, rubs, gallops or clicks. No pedal edema. Gastrointestinal system: Abdomen is nondistended, soft and nontender. No organomegaly or masses felt. Normal bowel sounds heard.  No rebound.  No guarding.  Some right lower back pain/flank pain. Central nervous system: Alert and oriented. No focal neurological deficits. Extremities: Symmetric 5 x 5 power. Skin: No rashes, lesions or ulcers Psychiatry: Judgement and insight appear normal. Mood & affect appropriate.     Data Reviewed: I have personally reviewed following labs and imaging studies  CBC: Recent Labs  Lab 10/28/24 1923 10/28/24 1941 10/29/24 0115  10/30/24 0502 11/01/24 0558  WBC 11.6*  --  10.2 7.9 4.8  NEUTROABS 9.2*  --  8.5*  --   --   HGB 13.4 13.9 11.4* 10.7* 10.8*  HCT 41.1 41.0 33.9* 33.5* 32.8*  MCV 93.0  --  93.6 94.9 92.9  PLT 206  --  159 143* 185    Basic Metabolic Panel: Recent Labs  Lab 10/28/24 1923 10/28/24 1941 10/29/24 0115 10/30/24 0502 11/01/24 0558  NA 137 138 137 136 138  K 3.4* 4.0 4.1 3.7 4.2  CL 102 101 106 104 105  CO2 21*  --  21* 21* 24  GLUCOSE 87 84 81 76 79  BUN 25* 32* 23 18 13   CREATININE 1.63* 1.80* 1.53* 1.33* 1.03*  CALCIUM  9.3  --  8.2* 8.6* 9.0  MG  --   --  2.0  --   --   PHOS  --   --  4.2  --   --  GFR: Estimated Creatinine Clearance: 69.5 mL/min (A) (by C-G formula based on SCr of 1.03 mg/dL (H)).  Liver Function Tests: Recent Labs  Lab 10/28/24 1923 10/29/24 0115 10/30/24 0502 11/01/24 0558  AST 45* 32 23 26  ALT 52* 40 29 30  ALKPHOS 133* 105 94 111  BILITOT 1.7* 1.2 0.7 0.6  PROT 7.5 5.9* 5.8* 6.4*  ALBUMIN 3.9 3.1* 2.9* 3.2*    CBG: No results for input(s): GLUCAP in the last 168 hours.   Recent Results (from the past 240 hours)  Blood Culture (routine x 2)     Status: None (Preliminary result)   Collection Time: 10/28/24  7:20 PM   Specimen: BLOOD RIGHT WRIST  Result Value Ref Range Status   Specimen Description   Final    BLOOD RIGHT WRIST Performed at [facility], [address], [place], [place] [address]    Special Requests   Final    BOTTLES DRAWN AEROBIC AND ANAEROBIC Blood Culture adequate volume Performed at [facility], [address] [place], [place] [address]    Culture   Final    NO GROWTH 3 DAYS Performed at [facility], [address] [place], [place] [address]    Report Status PENDING  Incomplete  Blood Culture (routine x 2)     Status: None (Preliminary result)   Collection Time: 10/28/24  7:20 PM   Specimen: Left Antecubital; Blood  Result Value Ref Range Status    Specimen Description   Final    LEFT ANTECUBITAL Performed at [facility], [address], [place], [place] [address]    Special Requests   Final    BOTTLES DRAWN AEROBIC AND ANAEROBIC Blood Culture adequate volume Performed at [facility], [address], [place], [place] [address]    Culture   Final    NO GROWTH 3 DAYS Performed at [facility], [address], [place], [place] [address]    Report Status PENDING  Incomplete  Resp panel by RT-PCR (RSV, Flu A&B, Covid) Peripheral     Status: None   Collection Time: 10/28/24  7:39 PM   Specimen: Peripheral; Nasal Swab  Result Value Ref Range Status   SARS Coronavirus 2 by RT PCR NEGATIVE NEGATIVE Final    Comment: (NOTE) SARS-CoV-2 target nucleic acids are NOT DETECTED.  The SARS-CoV-2 RNA is generally detectable in upper respiratory specimens during the acute phase of infection. The lowest concentration of SARS-CoV-2 viral copies this assay can detect is 138 copies/mL. A negative result does not preclude SARS-Cov-2 infection and should not be used as the sole basis for treatment or other patient management decisions. A negative result may occur with  improper specimen collection/handling, submission of specimen other than nasopharyngeal swab, presence of viral mutation(s) within the areas targeted by this assay, and inadequate number of viral copies(<138 copies/mL). A negative result must be combined with clinical observations, patient history, and epidemiological information. The expected result is Negative.  Fact Sheet for Patients:  bloggercourse.com  Fact Sheet for Healthcare Providers:  seriousbroker.it  This test is no t yet approved or cleared by the [place]  FDA and  has been authorized for detection and/or diagnosis of SARS-CoV-2 by FDA under an Emergency Use Authorization (EUA). This EUA will remain  in effect  (meaning this test can be used) for the duration of the COVID-19 declaration under Section 564(b)(1) of the Act, 21 U.S.C.section 360bbb-3(b)(1), unless the authorization is terminated  or revoked sooner.       Influenza A by PCR NEGATIVE NEGATIVE Final   Influenza B by PCR NEGATIVE NEGATIVE Final    Comment: (NOTE) The Xpert Xpress SARS-CoV-2/FLU/RSV plus assay is intended as an aid in the diagnosis of influenza from Nasopharyngeal swab specimens and should not be used as a sole basis for treatment. Nasal washings and aspirates are unacceptable for Xpert Xpress SARS-CoV-2/FLU/RSV testing.  Fact Sheet for Patients: bloggercourse.com  Fact Sheet for Healthcare Providers: seriousbroker.it  This test is not yet approved or cleared by the [place]  FDA and has been authorized for detection and/or diagnosis of SARS-CoV-2 by FDA under an Emergency Use Authorization (EUA). This EUA will remain in effect (meaning this test can be used) for the duration of the COVID-19 declaration under Section 564(b)(1) of the Act, 21 U.S.C. section 360bbb-3(b)(1), unless the authorization is terminated or revoked.     Resp Syncytial Virus by PCR NEGATIVE NEGATIVE Final    Comment: (NOTE) Fact Sheet for Patients: bloggercourse.com  Fact Sheet for Healthcare Providers: seriousbroker.it  This test is not yet approved or cleared by the [place]  FDA and has been authorized for detection and/or diagnosis of SARS-CoV-2 by FDA under an Emergency Use Authorization (EUA). This EUA will remain in effect (meaning this test can be used) for the duration of the COVID-19 declaration under Section 564(b)(1) of the Act, 21 U.S.C. section 360bbb-3(b)(1), unless the authorization is terminated or revoked.  Performed at [facility], [address] [place], [place] [address]   Urine  Culture     Status: None   Collection Time: 10/28/24 11:09 PM   Specimen: Urine, Random  Result Value Ref Range Status   Specimen Description   Final    URINE, RANDOM Performed at [facility], [address], [place], [place] [address]    Special Requests   Final    NONE Reflexed from [id number] Performed at [facility], [address], [place], [place] [address]    Culture   Final    NO GROWTH Performed at [facility], [address], [place], [place] [address]    Report Status 10/30/2024 FINAL  Final  Urine Culture (for pregnant, neutropenic or urologic patients or patients with an indwelling urinary catheter)     Status: None   Collection Time: 10/30/24  3:07 PM   Specimen: Urine, Clean Catch  Result Value Ref Range Status   Specimen Description   Final    URINE, CLEAN CATCH Performed at [facility], [address] [place], [place] [address]    Special Requests   Final    NONE Performed at [facility], [address], [place], [place] [address]    Culture   Final    NO GROWTH Performed at [facility], [address], [place], [place] [address]    Report Status 10/31/2024 FINAL  Final         Radiology Studies: VAS US  UPPER EXTREMITY VENOUS DUPLEX Result Date: 11/01/2024 UPPER VENOUS STUDY  Patient Name:  Krystina CUMMINGS Michelle  Date of Exam:   11/01/2024 Medical Rec #: [id number]             Accession #:    [id number] Date of Birth: 1961-09-18             Patient Gender: F Patient Age:   87 years Exam Location:  [facility] Procedure:  VAS US  UPPER EXTREMITY VENOUS DUPLEX Referring Phys: CORNELIUS VAN DAM --------------------------------------------------------------------------------  Indications: Fever of unknown origin Limitations: Poor ultrasound/tissue interface. Comparison Study: No previous exams Performing Technologist: Jody Hill RVT, RDMS  Examination Guidelines: A  complete evaluation includes B-mode imaging, spectral Doppler, color Doppler, and power Doppler as needed of all accessible portions of each vessel. Bilateral testing is considered an integral part of a complete examination. Limited examinations for reoccurring indications may be performed as noted.  Right Findings: +----------+------------+---------+-----------+----------+-------+ RIGHT     CompressiblePhasicitySpontaneousPropertiesSummary +----------+------------+---------+-----------+----------+-------+ IJV           Full       Yes       Yes                      +----------+------------+---------+-----------+----------+-------+ Subclavian    Full       Yes       Yes                      +----------+------------+---------+-----------+----------+-------+ Axillary      Full       Yes       Yes                      +----------+------------+---------+-----------+----------+-------+ Brachial      Full       Yes       Yes                      +----------+------------+---------+-----------+----------+-------+ Radial        Full                                          +----------+------------+---------+-----------+----------+-------+ Ulnar         Full                                          +----------+------------+---------+-----------+----------+-------+ Cephalic      Full                                          +----------+------------+---------+-----------+----------+-------+ Basilic       Full       Yes       Yes                      +----------+------------+---------+-----------+----------+-------+  Left Findings: +----------+------------+---------+-----------+----------+-------------------+ LEFT      CompressiblePhasicitySpontaneousProperties      Summary       +----------+------------+---------+-----------+----------+-------------------+ IJV           Full       Yes       Yes                                   +----------+------------+---------+-----------+----------+-------------------+ Subclavian    Full       Yes       Yes                                  +----------+------------+---------+-----------+----------+-------------------+  Axillary      Full       Yes       Yes                                  +----------+------------+---------+-----------+----------+-------------------+ Brachial      Full       Yes       Yes                                  +----------+------------+---------+-----------+----------+-------------------+ Radial        Full                                                      +----------+------------+---------+-----------+----------+-------------------+ Ulnar                                               not well visualized +----------+------------+---------+-----------+----------+-------------------+ Cephalic      Full                                                      +----------+------------+---------+-----------+----------+-------------------+ Basilic       Full       Yes       Yes                                  +----------+------------+---------+-----------+----------+-------------------+ Limited visualization of radial and ulnar veins due to wrapped IV placement. Ulnar not well visualized due to size. Visualized areas appear patent. Basilic vein is patent, however a branch off basilic at [facility] level is thrombosed just proximal to IV placement.  Summary: No evidence of deep vein thrombosis involving the right and left upper extremities. Right: No evidence of superficial vein thrombosis in the upper extremity.  Left: Findings consistent with age indeterminate superficial vein thrombosis involving a branch ofthe left basilic vein just proximal to IV placement.  *See table(s) above for measurements and observations.  Diagnosing physician: Debby Robertson Electronically signed by Debby Robertson on 11/01/2024 at 4:11:59 PM.    Final    VAS US  LOWER  EXTREMITY VENOUS (DVT) Result Date: 11/01/2024  Lower Venous DVT Study Patient Name:  [facility]  Date of Exam:   11/01/2024 Medical Rec #: [id number]             Accession #:    [id number] Date of Birth: [birth year]             Patient Gender: F Patient Age:   55 years Exam Location:  [facility] Procedure:      VAS US  LOWER EXTREMITY VENOUS (DVT) Referring Phys: CORNELIUS VAN DAM --------------------------------------------------------------------------------  Indications: Fever of unknown origin.  Comparison Study: No previous exams Performing Technologist: Jody Hill RVT, RDMS  Examination Guidelines: A complete evaluation includes B-mode imaging, spectral Doppler, color Doppler, and power Doppler as needed of all accessible portions  of each vessel. Bilateral testing is considered an integral part of a complete examination. Limited examinations for reoccurring indications may be performed as noted. The reflux portion of the exam is performed with the patient in reverse Trendelenburg.  +---------+---------------+---------+-----------+----------+--------------+ RIGHT    CompressibilityPhasicitySpontaneityPropertiesThrombus Aging +---------+---------------+---------+-----------+----------+--------------+ CFV      Full           Yes      Yes                                 +---------+---------------+---------+-----------+----------+--------------+ SFJ      Full                                                        +---------+---------------+---------+-----------+----------+--------------+ FV Prox  Full           Yes      Yes                                 +---------+---------------+---------+-----------+----------+--------------+ FV Mid   Full           Yes      Yes                                 +---------+---------------+---------+-----------+----------+--------------+ FV DistalFull           Yes      Yes                                  +---------+---------------+---------+-----------+----------+--------------+ PFV      Full                                                        +---------+---------------+---------+-----------+----------+--------------+ POP      Full           Yes      Yes                                 +---------+---------------+---------+-----------+----------+--------------+ PTV      Full                                                        +---------+---------------+---------+-----------+----------+--------------+ PERO     Full                                                        +---------+---------------+---------+-----------+----------+--------------+   +---------+---------------+---------+-----------+-------------+--------------+ LEFT     CompressibilityPhasicitySpontaneityProperties   Thrombus Aging +---------+---------------+---------+-----------+-------------+--------------+ CFV      Full  Yes      Yes                                    +---------+---------------+---------+-----------+-------------+--------------+ SFJ      Full                                                           +---------+---------------+---------+-----------+-------------+--------------+ FV Prox  Full           Yes      Yes                                    +---------+---------------+---------+-----------+-------------+--------------+ FV Mid   Full           Yes      Yes                                    +---------+---------------+---------+-----------+-------------+--------------+ FV DistalFull           Yes      Yes                                    +---------+---------------+---------+-----------+-------------+--------------+ PFV      Full                                                           +---------+---------------+---------+-----------+-------------+--------------+ POP      Full           Yes      Yes        rouleaux flow                +---------+---------------+---------+-----------+-------------+--------------+ PTV      Full                                                           +---------+---------------+---------+-----------+-------------+--------------+ PERO     Full                                                           +---------+---------------+---------+-----------+-------------+--------------+     Summary: BILATERAL: - No evidence of deep vein thrombosis seen in the lower extremities, bilaterally. -No evidence of popliteal cyst, bilaterally.   *See table(s) above for measurements and observations. Electronically signed by Debby Robertson on 11/01/2024 at 4:05:59 PM.    Final    CT MAXILLOFACIAL W CONTRAST Result Date: 10/31/2024 CLINICAL DATA:  Initial evaluation for nasal abscess. EXAM: CT MAXILLOFACIAL WITH CONTRAST TECHNIQUE: Multidetector CT imaging  of the maxillofacial structures was performed with intravenous contrast. Multiplanar CT image reconstructions were also generated. RADIATION DOSE REDUCTION: This exam was performed according to the departmental dose-optimization program which includes automated exposure control, adjustment of the mA and/or kV according to patient size and/or use of iterative reconstruction technique. CONTRAST:  75mL OMNIPAQUE  IOHEXOL  300 MG/ML  SOLN COMPARISON:  None Available. FINDINGS: Osseous: No acute osseous abnormality about the face. No discrete or worrisome osseous lesions. Sequelae of recent total dental extraction noted. Orbits: Globes and orbital soft tissues within normal limits. No evidence for intraorbital or postseptal cellulitis. Sinuses: Mild mucosal thickening about the inferior aspects of the maxillary sinuses bilaterally. Paranasal sinuses are otherwise largely clear. Mild right-to-left nasal septal deviation noted. Mastoid air cells and middle ear cavities are clear. Soft tissues: No significant soft tissue swelling or inflammatory changes seen about the face. No  discrete abscess or drainable fluid collection. Salivary glands including the parotid and submandibular glands are within normal limits. No adenopathy within the visualized upper neck. Mild atheromatous plaque noted about the left carotid bulb without stenosis. Limited intracranial: Unremarkable. IMPRESSION: 1. Negative maxillofacial CT. No significant soft tissue swelling or inflammatory changes seen about the face. No discrete abscess or drainable fluid collection. 2. Sequelae of recent total dental extraction. Electronically Signed   By: Morene Hoard M.D.   On: 10/31/2024 18:33   CT HEAD WO CONTRAST ( ) Result Date: 10/31/2024 CLINICAL DATA:  Initial evaluation for metastatic disease, migraine headache. EXAM: CT HEAD WITHOUT CONTRAST TECHNIQUE: Contiguous axial images were obtained from the base of the skull through the vertex without intravenous contrast. RADIATION DOSE REDUCTION: This exam was performed according to the departmental dose-optimization program which includes automated exposure control, adjustment of the mA and/or kV according to patient size and/or use of iterative reconstruction technique. COMPARISON:  None available. FINDINGS: Brain: Cerebral volume within normal limits. No acute intracranial hemorrhage. No acute large vessel territory infarct. No mass lesion or evidence for intracranial metastatic disease on this noncontrast examination. No mass effect or midline shift. No hydrocephalus or extra-axial fluid collection. Vascular: No abnormal hyperdense vessel. Skull: Scalp soft tissues within normal limits. Calvarium intact. No visible focal osseous lesions. Sinuses/Orbits: Globes orbital soft tissues within normal limits. Paranasal sinuses and mastoid air cells are largely clear. Other: None. IMPRESSION: Normal head CT. No acute intracranial abnormality. No evidence for intracranial metastatic disease on this noncontrast examination. Electronically Signed   By: Morene Hoard M.D.   On: 10/31/2024 18:27   MR THORACIC SPINE W WO CONTRAST Result Date: 10/31/2024 EXAM: MRI THORACIC SPINE WITH AND WITHOUT INTRAVENOUS CONTRAST 10/31/2024 11:48:33 AM TECHNIQUE: Multiplanar multisequence MRI of the thoracic spine was performed with and without the administration of intravenous contrast. COMPARISON: Restaging CT chest, abdomen, and pelvis 10/01/2023. Recent CT abdomen and pelvis 10/29/2023. CLINICAL HISTORY: 64 year old female. Mid-back pain. Recent sepsis, flank pain, and metastatic colon cancer. FINDINGS: Normal thoracic segmentation on the comparison CT. Limited sagittal imaging of the cervical spine is negative aside from evidence of lower cervical disc and endplate degeneration including at C5-C6 and C6-C7. BONES AND ALIGNMENT: Normal alignment. Normal vertebral body heights. Small sclerotic bone island at the T5 vertebral body (series 18 image 10) appears stable since 2024 (no follow up imaging recommended). No thoracic marrow edema or vertebral enhancement. Background marrow signal is normal. SPINAL CORD: Normal spinal cord volume. Normal spinal cord signal. Capacious thoracic spinal canal. Normal conus medullaris at T12-L1. No abnormal intradural enhancement. No dural thickening. SOFT  TISSUES: Small bilateral layering pleural effusions (series 21 image 17), new from recent CT abdomen. Negative thoracic paraspinal soft tissues. Partially visible confluent posterior subcutaneous edema located posteriorly and beginning at the thoracolumbar junction continuing inferiorly (series 19 image 10). No adjacent paraspinal muscle edema. No associated abnormal soft tissue enhancement. DEGENERATIVE: Generally mild for age thoracic spine degeneration. No advanced disc degeneration. Small right paracentral disc bulge or protrusion at T12-L1 (series 20 image 37 and series 17 image 9). Mild to moderate lower thoracic posterior element hypertrophy including at T9-T10 and T10-T11. Small right  paracentral disc bulge or protrusion at T12-L1 (series 20 image 37 and series 17 image 9). Moderate degenerative right side T9 and T10 neural foraminal stenosis appears chronic and related to facet hypertrophy. No thoracic spinal stenosis. IMPRESSION: 1. No acute or metastatic process identified in the thoracic spine. 2. Small bilateral pleural effusions, new from 10/29/2023 CT abdomen and pelvis. And partially visible posterior subcutaneous edema beginning at the thoracolumbar junction. 3. Generally mild for age thoracic spine degeneration, no thoracic spinal stenosis. Electronically signed by: Helayne Hurst MD 10/31/2024 01:08 PM EST RP Workstation: [id number]        Scheduled Meds:  alprazolam   2 mg Oral q morning   bisacodyl   10 mg Oral Daily   buPROPion   300 mg Oral q morning   chlorhexidine   15 mL Mouth/Throat BID   enoxaparin  (LOVENOX ) injection  40 mg Subcutaneous Q24H   polyethylene glycol  17 g Oral BID   rOPINIRole   0.25 mg Oral QHS   senna-docusate  1 tablet Oral BID   sorbitol   30 mL Oral Q3H   Continuous Infusions:   LOS: 4 days    Time spent: 40 minutes    Toribio Hummer, MD [facility]   To contact the attending provider between 7A-7P or the covering provider during after hours 7P-7A, please log into the web site [email or web address] and access using universal [place] password for that web site. If you do not have the password, please call the hospital operator.  11/01/2024, 7:28 PM    "

## 2024-11-01 NOTE — Progress Notes (Signed)
" ° ° ° ° °  CT MF unremarkable.  She has received at least 4 days of cefepime  with urine that was sterile a few hours into her admission.  If she has an infected spine or native hip we will want deep aspirate or operative cultures to guide therapy and would wand to DC abx to increase yield on cultures.   Norma Colon 11/01/2024, 9:48 AM  "

## 2024-11-01 NOTE — Progress Notes (Signed)
" °  Echocardiogram 2D Echocardiogram unable to complete today due to scheduling conflicts  Tinnie FORBES Gosling RDCS 11/01/2024, 3:34 PM "

## 2024-11-01 NOTE — Progress Notes (Signed)
 BUE and BLE venous duplex exams have been completed.   Results can be found under chart review under CV PROC. 11/01/2024 11:00 AM Bo Teicher RVT, RDMS

## 2024-11-02 ENCOUNTER — Inpatient Hospital Stay (HOSPITAL_COMMUNITY)

## 2024-11-02 ENCOUNTER — Other Ambulatory Visit (HOSPITAL_COMMUNITY): Payer: Self-pay

## 2024-11-02 DIAGNOSIS — R509 Fever, unspecified: Secondary | ICD-10-CM | POA: Diagnosis not present

## 2024-11-02 DIAGNOSIS — R10A0 Flank pain, unspecified side: Secondary | ICD-10-CM

## 2024-11-02 DIAGNOSIS — R10A1 Flank pain, right side: Secondary | ICD-10-CM | POA: Diagnosis not present

## 2024-11-02 DIAGNOSIS — F419 Anxiety disorder, unspecified: Secondary | ICD-10-CM | POA: Diagnosis not present

## 2024-11-02 DIAGNOSIS — G8929 Other chronic pain: Secondary | ICD-10-CM

## 2024-11-02 DIAGNOSIS — N179 Acute kidney failure, unspecified: Secondary | ICD-10-CM | POA: Diagnosis not present

## 2024-11-02 LAB — CBC WITH DIFFERENTIAL/PLATELET
Abs Immature Granulocytes: 0.05 10*3/uL (ref 0.00–0.07)
Basophils Absolute: 0 10*3/uL (ref 0.0–0.1)
Basophils Relative: 1 %
Eosinophils Absolute: 0.2 10*3/uL (ref 0.0–0.5)
Eosinophils Relative: 4 %
HCT: 33.6 % — ABNORMAL LOW (ref 36.0–46.0)
Hemoglobin: 10.9 g/dL — ABNORMAL LOW (ref 12.0–15.0)
Immature Granulocytes: 1 %
Lymphocytes Relative: 26 %
Lymphs Abs: 1.1 10*3/uL (ref 0.7–4.0)
MCH: 30.3 pg (ref 26.0–34.0)
MCHC: 32.4 g/dL (ref 30.0–36.0)
MCV: 93.3 fL (ref 80.0–100.0)
Monocytes Absolute: 0.4 10*3/uL (ref 0.1–1.0)
Monocytes Relative: 11 %
Neutro Abs: 2.4 10*3/uL (ref 1.7–7.7)
Neutrophils Relative %: 57 %
Platelets: 195 10*3/uL (ref 150–400)
RBC: 3.6 MIL/uL — ABNORMAL LOW (ref 3.87–5.11)
RDW: 13.2 % (ref 11.5–15.5)
Smear Review: NORMAL
WBC: 4.2 10*3/uL (ref 4.0–10.5)
nRBC: 0 % (ref 0.0–0.2)

## 2024-11-02 LAB — BASIC METABOLIC PANEL WITH GFR
Anion gap: 10 (ref 5–15)
BUN: 11 mg/dL (ref 8–23)
CO2: 26 mmol/L (ref 22–32)
Calcium: 9 mg/dL (ref 8.9–10.3)
Chloride: 106 mmol/L (ref 98–111)
Creatinine, Ser: 0.91 mg/dL (ref 0.44–1.00)
GFR, Estimated: >60 mL/min
Glucose, Bld: 95 mg/dL (ref 70–99)
Potassium: 3.8 mmol/L (ref 3.5–5.1)
Sodium: 142 mmol/L (ref 135–145)

## 2024-11-02 LAB — HEPATITIS A ANTIBODY, TOTAL: hep A Total Ab: NONREACTIVE

## 2024-11-02 LAB — ECHOCARDIOGRAM COMPLETE
Area-P 1/2: 5.09 cm2
Height: 68 in
S' Lateral: 3.2 cm
Weight: 3392 [oz_av]

## 2024-11-02 MED ORDER — PERFLUTREN LIPID MICROSPHERE
1.0000 mL | INTRAVENOUS | Status: AC | PRN
  Administered 2024-11-02: 3 mL via INTRAVENOUS

## 2024-11-02 MED ORDER — GADOBUTROL 1 MMOL/ML IV SOLN
9.0000 mL | Freq: Once | INTRAVENOUS | Status: AC | PRN
  Administered 2024-11-02: 9 mL via INTRAVENOUS

## 2024-11-02 NOTE — Plan of Care (Signed)

## 2024-11-02 NOTE — Progress Notes (Signed)
 " PROGRESS NOTE    Norma Colon  FMW:[id number] DOB: Sep 17, 1961 DOA: 10/28/2024 PCP: Mat Browning, MD    Chief Complaint  Patient presents with   Fever    Brief Narrative:  64 year old female with a history of rectal cancer with mets to the liver in remission restless leg syndrome right ureteral injury chronic anxiety depression chronic low back pain status post right ureteral stent placement admitted with right flank pain and shaking chills and fever.  She had a temp of 102.5 prior to coming into the ER associated with right CVA tenderness and dysuria.  In the ER she was found to be tachypneic tachycardic with a fever and UA was positive for pyuria and she was started on IV antibiotics.  In the ED she received vancomycin  and Flagyl  and cefepime . CT abdomen and pelvis showed no acute abnormality or abscess or obstructive uropathy.  Stable hepatic mets no new liver lesions.  Cholelithiasis seen.  No CBD dilatation no inflammatory stranding.   Assessment & Plan:   Principal Problem:   Right flank pain Active Problems:   Acute kidney injury   Right ureteral injury   FUO (fever of unknown origin)   Edentulous   Chronic flank pain   Constipation   Restless leg syndrome   Anxiety and depression   #1 fever - Patient noted with fever. -Patient presented with fever, right flank pain, fever chills with a Tmax of 101.1 down from 102.8. -Fever curve has trended down currently afebrile for the past 24 hours. - Urinalysis done on admission consistent with UTI however urine cultures with no growth. - Patient noted to have been started on IV antibiotics. - Patient without any respiratory symptoms, no diarrhea. - Patient with a lactic acidosis which resolved with hydration and IV antibiotics. - Patient noted with history of right ureteral injury and had Foley catheter placed 1 time which she does not have anymore. - Patient noted to have all her teeth surgically removed on 10/23/2024  and GYN procedure on Thursday, 10/26/2024. - CT head CT maxillofacial negative for any acute abnormalities. - MRI T-spine with no acute or metastatic process identified in the T-spine. - MRI of the L-spine and hip pending for further evaluation. -2D echo with EF of 60 to 65%, and WMA, no evidence of pericardial effusion, mild MVR, trivial AVR, no evidence of vegetations noted.  - Patient seen in consultation by ID, IV antibiotics have been discontinued pending MRI of the L and T-spine. - ID following appreciate input and recommendations.  2.  AKI -Likely secondary to prerenal azotemia. - Resolved with hydration.  3.  Anxiety/depression -Stable. - Continue Wellbutrin , Xanax .   4.  Constipation - Continue current bowel regimen of MiraLAX  twice daily, Senokot-S twice daily.   - Status post sorbitol  p.o. x 1.   5.  RLS - Continue Requip .   6.  History of rectal cancer with mets to the liver in remission - Dr. Alvia discussed case with Dr. Cloretta who does not feel patient likely has a tumor fever. - Outpatient follow-up with oncology.   DVT prophylaxis: Lovenox  Code Status: Full Family Communication: Updated patient.  No family at bedside. Disposition: Likely home when clinically improved and cleared by ID.  Status is: Inpatient Remains inpatient appropriate because: Severity of illness   Consultants:  ID: Dr. Fleeta Rothman 10/31/2024  Procedures:  CT abdomen and pelvis 10/28/2024 CT head 10/31/2024 CT maxillofacial 10/31/2024 Chest x-ray 10/28/2024 MRI T-spine 10/31/2024 Bilateral upper venous duplex 11/01/2024 Bilateral  lower extremity venous duplex 11/01/2024 2D echo 11/02/2024  Antimicrobials:  Anti-infectives (From admission, onward)    Start     Dose/Rate Route Frequency Ordered Stop   10/29/24 0800  ceFEPIme  (MAXIPIME ) 2 g in sodium chloride  0.9 % 100 mL IVPB  Status:  Discontinued        2 g 200 mL/hr over 30 Minutes Intravenous Every 12 hours 10/29/24 0057 11/01/24  0948   10/28/24 1945  ceFEPIme  (MAXIPIME ) 2 g in sodium chloride  0.9 % 100 mL IVPB        2 g 200 mL/hr over 30 Minutes Intravenous  Once 10/28/24 1944 10/28/24 2026   10/28/24 1945  metroNIDAZOLE  (FLAGYL ) IVPB 500 mg        500 mg 100 mL/hr over 60 Minutes Intravenous  Once 10/28/24 1944 10/28/24 2059   10/28/24 1945  vancomycin  (VANCOCIN ) IVPB 1000 mg/200 mL premix        1,000 mg 200 mL/hr over 60 Minutes Intravenous  Once 10/28/24 1944 10/28/24 2150         Subjective: Patient laying in bed, dressed in her own clothes.  Patient states she is ready to go.  Patient states she is not doing anything here.  Patient still awaiting MRI to be done.  Patient denies any chest pain or shortness of breath.  No abdominal pain.  States flank pain still there and unchanged and states she always has flank pain.  Patient states she is not doing anything exertional, to see whether flank pain is worse than her usual chronic flank pain.   Objective: Vitals:   11/01/24 0439 11/01/24 1209 11/01/24 1921 11/02/24 0443  BP: 125/77 118/86 137/79 (!) 105/54  Pulse: 73 85 78 71  Resp: 20  16 14   Temp: 98.5 F (36.9 C) 98.2 F (36.8 C) 97.8 F (36.6 C) 97.7 F (36.5 C)  TempSrc: Oral Oral Oral Oral  SpO2:  99% 98% 96%  Weight:      Height:       No intake or output data in the 24 hours ending 11/02/24 1310  Filed Weights   10/28/24 1838 10/28/24 2108  Weight: 97.5 kg 96.2 kg    Examination:  General exam: NAD. Respiratory system: CTAB.  No wheezes, no crackles, no rhonchi.  Fair air movement.  Speaking in full sentences.  Cardiovascular system: Regular rate and rhythm.  No murmurs rubs or gallops.  No JVD.  No lower extremity edema.  Gastrointestinal system: Abdomen is soft, nontender, nondistended, positive bowel sounds.  No rebound.  No guarding.  Some right lower back pain/flank pain with palpation.  Central nervous system: Alert and oriented. No focal neurological deficits. Extremities:  Symmetric 5 x 5 power. Skin: No rashes, lesions or ulcers Psychiatry: Judgement and insight appear normal. Mood & affect appropriate.     Data Reviewed: I have personally reviewed following labs and imaging studies  CBC: Recent Labs  Lab 10/28/24 1923 10/28/24 1941 10/29/24 0115 10/30/24 0502 11/01/24 0558 11/02/24 0549  WBC 11.6*  --  10.2 7.9 4.8 4.2  NEUTROABS 9.2*  --  8.5*  --   --  2.4  HGB 13.4 13.9 11.4* 10.7* 10.8* 10.9*  HCT 41.1 41.0 33.9* 33.5* 32.8* 33.6*  MCV 93.0  --  93.6 94.9 92.9 93.3  PLT 206  --  159 143* 185 195    Basic Metabolic Panel: Recent Labs  Lab 10/28/24 1923 10/28/24 1941 10/29/24 0115 10/30/24 0502 11/01/24 0558 11/02/24 0549  NA 137 138 137  136 138 142  K 3.4* 4.0 4.1 3.7 4.2 3.8  CL 102 101 106 104 105 106  CO2 21*  --  21* 21* 24 26  GLUCOSE 87 84 81 76 79 95  BUN 25* 32* 23 18 13 11   CREATININE 1.63* 1.80* 1.53* 1.33* 1.03* 0.91  CALCIUM  9.3  --  8.2* 8.6* 9.0 9.0  MG  --   --  2.0  --   --   --   PHOS  --   --  4.2  --   --   --     GFR: Estimated Creatinine Clearance: 78.7 mL/min (by C-G formula based on SCr of 0.91 mg/dL).  Liver Function Tests: Recent Labs  Lab 10/28/24 1923 10/29/24 0115 10/30/24 0502 11/01/24 0558  AST 45* 32 23 26  ALT 52* 40 29 30  ALKPHOS 133* 105 94 111  BILITOT 1.7* 1.2 0.7 0.6  PROT 7.5 5.9* 5.8* 6.4*  ALBUMIN 3.9 3.1* 2.9* 3.2*    CBG: No results for input(s): GLUCAP in the last 168 hours.   Recent Results (from the past 240 hours)  Blood Culture (routine x 2)     Status: None (Preliminary result)   Collection Time: 10/28/24  7:20 PM   Specimen: BLOOD RIGHT WRIST  Result Value Ref Range Status   Specimen Description   Final    BLOOD RIGHT WRIST Performed at [facility], [address] [place], [place] [address]    Special Requests   Final    BOTTLES DRAWN AEROBIC AND ANAEROBIC Blood Culture adequate volume Performed at [facility],  [address], [place], [place] [address]    Culture   Final    NO GROWTH 4 DAYS Performed at [facility], [address] [place] [address], [place], [place] [address]    Report Status PENDING  Incomplete  Blood Culture (routine x 2)     Status: None (Preliminary result)   Collection Time: 10/28/24  7:20 PM   Specimen: Left Antecubital; Blood  Result Value Ref Range Status   Specimen Description   Final    LEFT ANTECUBITAL Performed at [facility], [address], [place], [place] [address]    Special Requests   Final    BOTTLES DRAWN AEROBIC AND ANAEROBIC Blood Culture adequate volume Performed at [facility], [address] [place], [place] [address]    Culture   Final    NO GROWTH 4 DAYS Performed at [facility], [address], [place], [place] [address]    Report Status PENDING  Incomplete  Resp panel by RT-PCR (RSV, Flu A&B, Covid) Peripheral     Status: None   Collection Time: 10/28/24  7:39 PM   Specimen: Peripheral; Nasal Swab  Result Value Ref Range Status   SARS Coronavirus 2 by RT PCR NEGATIVE NEGATIVE Final    Comment: (NOTE) SARS-CoV-2 target nucleic acids are NOT DETECTED.  The SARS-CoV-2 RNA is generally detectable in upper respiratory specimens during the acute phase of infection. The lowest concentration of SARS-CoV-2 viral copies this assay can detect is 138 copies/mL. A negative result does not preclude SARS-Cov-2 infection and should not be used as the sole basis for treatment or other patient management decisions. A negative result may occur with  improper specimen collection/handling, submission of specimen other than nasopharyngeal swab, presence of viral mutation(s) within the areas targeted by this assay, and inadequate number of viral copies(<138 copies/mL). A negative result  must be combined with clinical observations, patient history, and epidemiological information. The expected result is Negative.  Fact  Sheet for Patients:  bloggercourse.com  Fact Sheet for Healthcare Providers:  seriousbroker.it  This test is no t yet approved or cleared by the [place]  FDA and  has been authorized for detection and/or diagnosis of SARS-CoV-2 by FDA under an Emergency Use Authorization (EUA). This EUA will remain  in effect (meaning this test can be used) for the duration of the COVID-19 declaration under Section 564(b)(1) of the Act, 21 U.S.C.section 360bbb-3(b)(1), unless the authorization is terminated  or revoked sooner.       Influenza A by PCR NEGATIVE NEGATIVE Final   Influenza B by PCR NEGATIVE NEGATIVE Final    Comment: (NOTE) The Xpert Xpress SARS-CoV-2/FLU/RSV plus assay is intended as an aid in the diagnosis of influenza from Nasopharyngeal swab specimens and should not be used as a sole basis for treatment. Nasal washings and aspirates are unacceptable for Xpert Xpress SARS-CoV-2/FLU/RSV testing.  Fact Sheet for Patients: bloggercourse.com  Fact Sheet for Healthcare Providers: seriousbroker.it  This test is not yet approved or cleared by the [place]  FDA and has been authorized for detection and/or diagnosis of SARS-CoV-2 by FDA under an Emergency Use Authorization (EUA). This EUA will remain in effect (meaning this test can be used) for the duration of the COVID-19 declaration under Section 564(b)(1) of the Act, 21 U.S.C. section 360bbb-3(b)(1), unless the authorization is terminated or revoked.     Resp Syncytial Virus by PCR NEGATIVE NEGATIVE Final    Comment: (NOTE) Fact Sheet for Patients: bloggercourse.com  Fact Sheet for Healthcare Providers: seriousbroker.it  This test is not yet approved or cleared by the [place]  FDA and has been authorized for detection and/or diagnosis of SARS-CoV-2 by FDA under an  Emergency Use Authorization (EUA). This EUA will remain in effect (meaning this test can be used) for the duration of the COVID-19 declaration under Section 564(b)(1) of the Act, 21 U.S.C. section 360bbb-3(b)(1), unless the authorization is terminated or revoked.  Performed at [facility], [address] [place], [place] [address]   Urine Culture     Status: None   Collection Time: 10/28/24 11:09 PM   Specimen: Urine, Random  Result Value Ref Range Status   Specimen Description   Final    URINE, RANDOM Performed at [facility], [address], [place], [place] [address]    Special Requests   Final    NONE Reflexed from [id number] Performed at [facility], [address] [place], [place] [address]    Culture   Final    NO GROWTH Performed at [facility], [address], [place], [place] [address]    Report Status 10/30/2024 FINAL  Final  Urine Culture (for pregnant, neutropenic or urologic patients or patients with an indwelling urinary catheter)     Status: None   Collection Time: 10/30/24  3:07 PM   Specimen: Urine, Clean Catch  Result Value Ref Range Status   Specimen Description   Final    URINE, CLEAN CATCH Performed at [facility], [address] [place], [place] [address]    Special Requests   Final    NONE Performed at [facility], [address], [place], [place] [address]    Culture   Final    NO GROWTH Performed at [facility], [address] [place], [place] [address]  Report Status 10/31/2024 FINAL  Final         Radiology Studies: ECHOCARDIOGRAM COMPLETE Result Date: 11/02/2024    ECHOCARDIOGRAM REPORT   Patient Name:   Norma Colon Date of Exam: 11/02/2024 Medical Rec #:  [id number]            Height:       68.0 in Accession #:    [id number]           Weight:       212.0 lb Date of Birth:  11/24/1960            BSA:          2.095 m  Patient Age:    63 years             BP:           105/54 mmHg Patient Gender: F                    HR:           78 bpm. Exam Location:  Inpatient Procedure: 2D Echo, Cardiac Doppler, Color Doppler and Intracardiac            Opacification Agent (Both Spectral and Color Flow Doppler were            utilized during procedure). Indications:    Fever  History:        Patient has no prior history of Echocardiogram examinations.  Sonographer:    Philomena Daring Referring Phys: [id number] CORNELIUS N VAN DAM IMPRESSIONS  1. Left ventricular ejection fraction, by estimation, is 60 to 65%. The left ventricle has normal function. The left ventricle has no regional wall motion abnormalities. Left ventricular diastolic parameters were normal.  2. Right ventricular systolic function is normal. The right ventricular size is normal. Tricuspid regurgitation signal is inadequate for assessing PA pressure.  3. There is no evidence of pericardial effusion.  4. The mitral valve is normal in structure. Mild mitral valve regurgitation. No evidence of mitral stenosis.  5. The aortic valve is normal in structure. Aortic valve regurgitation is trivial. No aortic stenosis is present.  6. The inferior vena cava is normal in size with greater than 50% respiratory variability, suggesting right atrial pressure of 3 mmHg. Conclusion(s)/Recommendation(s): No evidence of valvular vegetations on this transthoracic echocardiogram. Consider a transesophageal echocardiogram to exclude infective endocarditis if clinically indicated. FINDINGS  Left Ventricle: Left ventricular ejection fraction, by estimation, is 60 to 65%. The left ventricle has normal function. The left ventricle has no regional wall motion abnormalities. Definity  contrast agent was given IV to delineate the left ventricular  endocardial borders. The left ventricular internal cavity size was normal in size. There is no left ventricular hypertrophy. Left ventricular diastolic parameters were  normal. Normal left ventricular filling pressure. Right Ventricle: The right ventricular size is normal. No increase in right ventricular wall thickness. Right ventricular systolic function is normal. Tricuspid regurgitation signal is inadequate for assessing PA pressure. Left Atrium: Left atrial size was normal in size. Right Atrium: Right atrial size was normal in size. Pericardium: There is no evidence of pericardial effusion. Mitral Valve: The mitral valve is normal in structure. Mild mitral valve regurgitation. No evidence of mitral valve stenosis. Tricuspid Valve: The tricuspid valve is normal in structure. Tricuspid valve regurgitation is trivial. No evidence of tricuspid stenosis. Aortic Valve: The aortic valve is normal in structure. Aortic valve regurgitation is trivial. No aortic stenosis is present. Pulmonic  Valve: The pulmonic valve was normal in structure. Pulmonic valve regurgitation is not visualized. No evidence of pulmonic stenosis. Aorta: The aortic root and ascending aorta are structurally normal, with no evidence of dilitation. Venous: The inferior vena cava is normal in size with greater than 50% respiratory variability, suggesting right atrial pressure of 3 mmHg. IAS/Shunts: No atrial level shunt detected by color flow Doppler.  LEFT VENTRICLE PLAX 2D LVIDd:         5.00 cm   Diastology LVIDs:         3.20 cm   LV e' medial:    8.05 cm/s LV PW:         1.00 cm   LV E/e' medial:  13.4 LV IVS:        0.90 cm   LV e' lateral:   9.57 cm/s LVOT diam:     1.90 cm   LV E/e' lateral: 11.3 LV SV:         65 LV SV Index:   31 LVOT Area:     2.84 cm LV IVRT:       53 msec  RIGHT VENTRICLE             IVC RV Basal diam:  3.50 cm     IVC diam: 1.80 cm RV Mid diam:    2.90 cm RV S prime:     13.70 cm/s TAPSE (M-mode): 2.4 cm LEFT ATRIUM             Index        RIGHT ATRIUM           Index LA diam:        3.70 cm 1.77 cm/m   RA Area:     17.30 cm LA Vol (A2C):   49.4 ml 23.58 ml/m  RA Volume:   47.50 ml   22.67 ml/m LA Vol (A4C):   53.1 ml 25.35 ml/m LA Biplane Vol: 54.5 ml 26.01 ml/m  AORTIC VALVE LVOT Vmax:   127.00 cm/s LVOT Vmean:  85.100 cm/s LVOT VTI:    0.231 m  AORTA Ao Root diam: 3.00 cm Ao Asc diam:  3.10 cm MITRAL VALVE MV Area (PHT): 5.09 cm     SHUNTS MV Decel Time: 149 msec     Systemic VTI:  0.23 m MV E velocity: 108.00 cm/s  Systemic Diam: 1.90 cm MV A velocity: 91.80 cm/s MV E/A ratio:  1.18 Wilbert Bihari MD Electronically signed by Wilbert Bihari MD Signature Date/Time: 11/02/2024/11:22:04 AM    Final    VAS US  UPPER EXTREMITY VENOUS DUPLEX Result Date: 11/01/2024 UPPER VENOUS STUDY  Patient Name:  Norma Colon  Date of Exam:   11/01/2024 Medical Rec #: [id number]             Accession #:    [id number] Date of Birth: 1961-03-20             Patient Gender: F Patient Age:   51 years Exam Location:  [facility] Procedure:      VAS US  UPPER EXTREMITY VENOUS DUPLEX Referring Phys: CORNELIUS VAN DAM --------------------------------------------------------------------------------  Indications: Fever of unknown origin Limitations: Poor ultrasound/tissue interface. Comparison Study: No previous exams Performing Technologist: Jody Hill RVT, RDMS  Examination Guidelines: A complete evaluation includes B-mode imaging, spectral Doppler, color Doppler, and power Doppler as needed of all accessible portions of each vessel. Bilateral testing is considered an integral part of a complete examination. Limited examinations for reoccurring indications may be  performed as noted.  Right Findings: +----------+------------+---------+-----------+----------+-------+ RIGHT     CompressiblePhasicitySpontaneousPropertiesSummary +----------+------------+---------+-----------+----------+-------+ IJV           Full       Yes       Yes                      +----------+------------+---------+-----------+----------+-------+ Subclavian    Full       Yes       Yes                       +----------+------------+---------+-----------+----------+-------+ Axillary      Full       Yes       Yes                      +----------+------------+---------+-----------+----------+-------+ Brachial      Full       Yes       Yes                      +----------+------------+---------+-----------+----------+-------+ Radial        Full                                          +----------+------------+---------+-----------+----------+-------+ Ulnar         Full                                          +----------+------------+---------+-----------+----------+-------+ Cephalic      Full                                          +----------+------------+---------+-----------+----------+-------+ Basilic       Full       Yes       Yes                      +----------+------------+---------+-----------+----------+-------+  Left Findings: +----------+------------+---------+-----------+----------+-------------------+ LEFT      CompressiblePhasicitySpontaneousProperties      Summary       +----------+------------+---------+-----------+----------+-------------------+ IJV           Full       Yes       Yes                                  +----------+------------+---------+-----------+----------+-------------------+ Subclavian    Full       Yes       Yes                                  +----------+------------+---------+-----------+----------+-------------------+ Axillary      Full       Yes       Yes                                  +----------+------------+---------+-----------+----------+-------------------+ Brachial      Full       Yes       Yes                                  +----------+------------+---------+-----------+----------+-------------------+  Radial        Full                                                      +----------+------------+---------+-----------+----------+-------------------+ Ulnar                                                not well visualized +----------+------------+---------+-----------+----------+-------------------+ Cephalic      Full                                                      +----------+------------+---------+-----------+----------+-------------------+ Basilic       Full       Yes       Yes                                  +----------+------------+---------+-----------+----------+-------------------+ Limited visualization of radial and ulnar veins due to wrapped IV placement. Ulnar not well visualized due to size. Visualized areas appear patent. Basilic vein is patent, however a branch off basilic at [facility] level is thrombosed just proximal to IV placement.  Summary: No evidence of deep vein thrombosis involving the right and left upper extremities. Right: No evidence of superficial vein thrombosis in the upper extremity.  Left: Findings consistent with age indeterminate superficial vein thrombosis involving a branch ofthe left basilic vein just proximal to IV placement.  *See table(s) above for measurements and observations.  Diagnosing physician: Debby Robertson Electronically signed by Debby Robertson on 11/01/2024 at 4:11:59 PM.    Final    VAS US  LOWER EXTREMITY VENOUS (DVT) Result Date: 11/01/2024  Lower Venous DVT Study Patient Name:  Norma Colon  Date of Exam:   11/01/2024 Medical Rec #: [id number]             Accession #:    [id number] Date of Birth: Nov 27, 1960             Patient Gender: F Patient Age:   34 years Exam [facility] Procedure:      VAS US  LOWER EXTREMITY VENOUS (DVT) Referring Phys: CORNELIUS VAN DAM --------------------------------------------------------------------------------  Indications: Fever of unknown origin.  Comparison Study: No previous exams Performing Technologist: Jody Hill RVT, RDMS  Examination Guidelines: A complete evaluation includes B-mode imaging, spectral Doppler, color Doppler, and power Doppler as needed of all  accessible portions of each vessel. Bilateral testing is considered an integral part of a complete examination. Limited examinations for reoccurring indications may be performed as noted. The reflux portion of the exam is performed with the patient in reverse Trendelenburg.  +---------+---------------+---------+-----------+----------+--------------+ RIGHT    CompressibilityPhasicitySpontaneityPropertiesThrombus Aging +---------+---------------+---------+-----------+----------+--------------+ CFV      Full           Yes      Yes                                 +---------+---------------+---------+-----------+----------+--------------+ SFJ  Full                                                        +---------+---------------+---------+-----------+----------+--------------+ FV Prox  Full           Yes      Yes                                 +---------+---------------+---------+-----------+----------+--------------+ FV Mid   Full           Yes      Yes                                 +---------+---------------+---------+-----------+----------+--------------+ FV DistalFull           Yes      Yes                                 +---------+---------------+---------+-----------+----------+--------------+ PFV      Full                                                        +---------+---------------+---------+-----------+----------+--------------+ POP      Full           Yes      Yes                                 +---------+---------------+---------+-----------+----------+--------------+ PTV      Full                                                        +---------+---------------+---------+-----------+----------+--------------+ PERO     Full                                                        +---------+---------------+---------+-----------+----------+--------------+    +---------+---------------+---------+-----------+-------------+--------------+ LEFT     CompressibilityPhasicitySpontaneityProperties   Thrombus Aging +---------+---------------+---------+-----------+-------------+--------------+ CFV      Full           Yes      Yes                                    +---------+---------------+---------+-----------+-------------+--------------+ SFJ      Full                                                           +---------+---------------+---------+-----------+-------------+--------------+  FV Prox  Full           Yes      Yes                                    +---------+---------------+---------+-----------+-------------+--------------+ FV Mid   Full           Yes      Yes                                    +---------+---------------+---------+-----------+-------------+--------------+ FV DistalFull           Yes      Yes                                    +---------+---------------+---------+-----------+-------------+--------------+ PFV      Full                                                           +---------+---------------+---------+-----------+-------------+--------------+ POP      Full           Yes      Yes        rouleaux flow               +---------+---------------+---------+-----------+-------------+--------------+ PTV      Full                                                           +---------+---------------+---------+-----------+-------------+--------------+ PERO     Full                                                           +---------+---------------+---------+-----------+-------------+--------------+     Summary: BILATERAL: - No evidence of deep vein thrombosis seen in the lower extremities, bilaterally. -No evidence of popliteal cyst, bilaterally.   *See table(s) above for measurements and observations. Electronically signed by Debby Robertson on 11/01/2024 at 4:05:59 PM.    Final          Scheduled Meds:  alprazolam   2 mg Oral q morning   bisacodyl   10 mg Oral Daily   buPROPion   300 mg Oral q morning   chlorhexidine   15 mL Mouth/Throat BID   enoxaparin  (LOVENOX ) injection  40 mg Subcutaneous Q24H   polyethylene glycol  17 g Oral BID   rOPINIRole   0.25 mg Oral QHS   senna-docusate  1 tablet Oral BID   Continuous Infusions:   LOS: 5 days    Time spent: 35 minutes    Toribio Hummer, MD [facility]   To contact the attending provider between 7A-7P or the covering provider during after hours 7P-7A, please log into the web site [email or web address] and access using universal [place] password for that web  site. If you do not have the password, please call the hospital operator.  11/02/2024, 1:10 PM    "

## 2024-11-03 DIAGNOSIS — R10A1 Flank pain, right side: Secondary | ICD-10-CM | POA: Diagnosis not present

## 2024-11-03 DIAGNOSIS — N179 Acute kidney failure, unspecified: Secondary | ICD-10-CM | POA: Diagnosis not present

## 2024-11-03 DIAGNOSIS — F419 Anxiety disorder, unspecified: Secondary | ICD-10-CM | POA: Diagnosis not present

## 2024-11-03 DIAGNOSIS — R509 Fever, unspecified: Secondary | ICD-10-CM | POA: Diagnosis not present

## 2024-11-03 LAB — BASIC METABOLIC PANEL WITH GFR
Anion gap: 9 (ref 5–15)
BUN: 9 mg/dL (ref 8–23)
CO2: 27 mmol/L (ref 22–32)
Calcium: 9.3 mg/dL (ref 8.9–10.3)
Chloride: 106 mmol/L (ref 98–111)
Creatinine, Ser: 0.91 mg/dL (ref 0.44–1.00)
GFR, Estimated: >60 mL/min
Glucose, Bld: 82 mg/dL (ref 70–99)
Potassium: 3.9 mmol/L (ref 3.5–5.1)
Sodium: 142 mmol/L (ref 135–145)

## 2024-11-03 LAB — CULTURE, BLOOD (ROUTINE X 2)
Culture: NO GROWTH
Culture: NO GROWTH
Special Requests: ADEQUATE
Special Requests: ADEQUATE

## 2024-11-03 LAB — CBC
HCT: 34.6 % — ABNORMAL LOW (ref 36.0–46.0)
Hemoglobin: 11.5 g/dL — ABNORMAL LOW (ref 12.0–15.0)
MCH: 30.4 pg (ref 26.0–34.0)
MCHC: 33.2 g/dL (ref 30.0–36.0)
MCV: 91.5 fL (ref 80.0–100.0)
Platelets: 240 10*3/uL (ref 150–400)
RBC: 3.78 MIL/uL — ABNORMAL LOW (ref 3.87–5.11)
RDW: 13.2 % (ref 11.5–15.5)
WBC: 5.6 10*3/uL (ref 4.0–10.5)
nRBC: 0 % (ref 0.0–0.2)

## 2024-11-03 MED ORDER — SORBITOL 70 % SOLN
30.0000 mL | Status: AC
  Administered 2024-11-03: 30 mL via ORAL
  Filled 2024-11-03 (×2): qty 30

## 2024-11-03 MED ORDER — SENNOSIDES-DOCUSATE SODIUM 8.6-50 MG PO TABS
2.0000 | ORAL_TABLET | Freq: Two times a day (BID) | ORAL | Status: DC
  Administered 2024-11-03: 2 via ORAL
  Filled 2024-11-03 (×2): qty 2

## 2024-11-03 MED ORDER — MIRABEGRON ER 25 MG PO TB24
25.0000 mg | ORAL_TABLET | Freq: Every day | ORAL | Status: DC
  Administered 2024-11-03 – 2024-11-05 (×3): 25 mg via ORAL
  Filled 2024-11-03 (×3): qty 1

## 2024-11-03 NOTE — Plan of Care (Signed)
   Problem: Health Behavior/Discharge Planning: Goal: Ability to manage health-related needs will improve Outcome: Progressing   Problem: Activity: Goal: Risk for activity intolerance will decrease Outcome: Progressing

## 2024-11-03 NOTE — Plan of Care (Signed)
   Problem: Health Behavior/Discharge Planning: Goal: Ability to manage health-related needs will improve Outcome: Progressing   Problem: Clinical Measurements: Goal: Ability to maintain clinical measurements within normal limits will improve Outcome: Progressing Goal: Will remain free from infection Outcome: Progressing Goal: Diagnostic test results will improve Outcome: Progressing

## 2024-11-03 NOTE — Progress Notes (Signed)
 " PROGRESS NOTE    Katherine Tout  FMW:[id number] DOB: [birth year] DOA: 10/28/2024 PCP: Mat Browning, MD    Chief Complaint  Patient presents with   Fever    Brief Narrative:  64 year old female with a history of rectal cancer with mets to the liver in remission restless leg syndrome right ureteral injury chronic anxiety depression chronic low back pain status post right ureteral stent placement admitted with right flank pain and shaking chills and fever.  She had a temp of 102.5 prior to coming into the ER associated with right CVA tenderness and dysuria.  In the ER she was found to be tachypneic tachycardic with a fever and UA was positive for pyuria and she was started on IV antibiotics.  In the ED she received vancomycin  and Flagyl  and cefepime . CT abdomen and pelvis showed no acute abnormality or abscess or obstructive uropathy.  Stable hepatic mets no new liver lesions.  Cholelithiasis seen.  No CBD dilatation no inflammatory stranding.   Assessment & Plan:   Principal Problem:   Right flank pain Active Problems:   Acute kidney injury   Right ureteral injury   FUO (fever of unknown origin)   Edentulous   Chronic flank pain   Constipation   Restless leg syndrome   Anxiety and depression   #1 fever - Patient noted with fever. -Patient presented with fever, right flank pain, fever chills with a Tmax of 101.1 down from 102.8. -Fever curve has trended down currently afebrile for the past 24 hours. - Urinalysis done on admission consistent with UTI however urine cultures with no growth. - Patient noted to have been started on IV antibiotics. - Patient without any respiratory symptoms, no diarrhea. - Patient with a lactic acidosis which resolved with hydration and IV antibiotics. - Patient noted with history of right ureteral injury and had Foley catheter placed 1 time which she does not have anymore. - Patient noted to have all her teeth surgically removed on 10/23/2024  and GYN procedure on Thursday, 10/26/2024. - CT head CT maxillofacial negative for any acute abnormalities. - MRI T-spine with no acute or metastatic process identified in the T-spine. - MRI of the L-spine and hip pending for further evaluation. -2D echo with EF of 60 to 65%, and WMA, no evidence of pericardial effusion, mild MVR, trivial AVR, no evidence of vegetations noted.  - Patient seen in consultation by ID, IV antibiotics have been discontinued pending MRI of the L and T-spine. -MRI of the T and L-spine done negative for any acute infectious etiology. -MRI of the hip with moderate OA of the right hip with subcortical cyst and edema in the superior acetabulum and small hip joint effusion.  Tear of the right anterosuperior labrum.  Partially visualized mild pelvic free fluid. -Per ID no further antibiotics needed at this time as patient has remained afebrile off IV antibiotics since patient has had adequate coverage of IV antibiotics. -Urology consulted who assessed the patient and refused some of patient's symptoms may be consistent with vesicourethral reflux and recommending a trial of Myrbetriq  for overactive bladder which has been started per urology and outpatient follow-up.  2.  AKI -Likely secondary to prerenal azotemia. - Resolved with hydration.  3.  Anxiety/depression -Stable. - Continue Wellbutrin , Xanax .  4.  Constipation - Continue current bowel regimen of MiraLAX  twice daily, Senokot-S twice daily.   - Sorbitol  every 3 hours p.o. x 2 doses  5.  RLS - Requip .  6.  History of  rectal cancer with mets to the liver in remission - Dr. Alvia discussed case with Dr. Cloretta who does not feel patient likely has a tumor fever. - Outpatient follow-up with oncology.   DVT prophylaxis: Lovenox  Code Status: Full Family Communication: Updated patient.  No family at bedside. Disposition: Likely home when clinically improved and cleared by ID hopefully in the next 24  hours..  Status is: Inpatient Remains inpatient appropriate because: Severity of illness   Consultants:  ID: Dr. Fleeta Rothman 10/31/2024 Urology: Dr. Roseann 11/03/2024  Procedures:  CT abdomen and pelvis 10/28/2024 CT head 10/31/2024 CT maxillofacial 10/31/2024 Chest x-ray 10/28/2024 MRI T-spine 10/31/2024 Bilateral upper venous duplex 11/01/2024 Bilateral lower extremity venous duplex 11/01/2024 2D echo 11/02/2024 MRI L-spine/MRI right hip 11/02/2024  Antimicrobials:  Anti-infectives (From admission, onward)    Start     Dose/Rate Route Frequency Ordered Stop   10/29/24 0800  ceFEPIme  (MAXIPIME ) 2 g in sodium chloride  0.9 % 100 mL IVPB  Status:  Discontinued        2 g 200 mL/hr over 30 Minutes Intravenous Every 12 hours 10/29/24 0057 11/01/24 0948   10/28/24 1945  ceFEPIme  (MAXIPIME ) 2 g in sodium chloride  0.9 % 100 mL IVPB        2 g 200 mL/hr over 30 Minutes Intravenous  Once 10/28/24 1944 10/28/24 2026   10/28/24 1945  metroNIDAZOLE  (FLAGYL ) IVPB 500 mg        500 mg 100 mL/hr over 60 Minutes Intravenous  Once 10/28/24 1944 10/28/24 2059   10/28/24 1945  vancomycin  (VANCOCIN ) IVPB 1000 mg/200 mL premix        1,000 mg 200 mL/hr over 60 Minutes Intravenous  Once 10/28/24 1944 10/28/24 2150         Subjective: Patient laying in bed frustrated.  Complains of right flank/lower back pain which occurs whenever she has to urinate with some associated dysuria.  Patient concerned may be a urological problem.  Patient denies any chest pain or shortness of breath.  No abdominal pain.   Objective: Vitals:   11/02/24 0443 11/02/24 1403 11/02/24 2042 11/03/24 0439  BP: (!) 105/54 (!) 124/58 133/73 118/65  Pulse: 71 78 77 78  Resp: 14 20 18 18   Temp: 97.7 F (36.5 C) 98.4 F (36.9 C) 98.5 F (36.9 C) 98.2 F (36.8 C)  TempSrc: Oral Oral Oral Oral  SpO2: 96%  100% 95%  Weight:      Height:        Intake/Output Summary (Last 24 hours) at 11/03/2024 1050 Last data filed at  11/03/2024 0813 Gross per 24 hour  Intake 240 ml  Output --  Net 240 ml    Filed Weights   10/28/24 1838 10/28/24 2108  Weight: 97.5 kg 96.2 kg    Examination:  General exam: NAD. Respiratory system: Lungs clear to auscultation bilaterally.  No wheezes, no crackles, no rhonchi.  Fair air movement.  Speaking full sentences.  Cardiovascular system: RRR no murmurs rubs or gallops.  No JVD.  No pitting lower extremity edema.  Gastrointestinal system: Abdomen is soft, nontender, nondistended, positive bowel sounds.  No rebound.  No guarding.  Right flank/lower back pain with palpation.  Central nervous system: Alert and oriented. No focal neurological deficits. Extremities: Symmetric 5 x 5 power. Skin: No rashes, lesions or ulcers Psychiatry: Judgement and insight appear normal. Mood & affect appropriate.     Data Reviewed: I have personally reviewed following labs and imaging studies  CBC: Recent Labs  Lab  10/28/24 1923 10/28/24 1941 10/29/24 0115 10/30/24 0502 11/01/24 0558 11/02/24 0549 11/03/24 0656  WBC 11.6*  --  10.2 7.9 4.8 4.2 5.6  NEUTROABS 9.2*  --  8.5*  --   --  2.4  --   HGB 13.4   < > 11.4* 10.7* 10.8* 10.9* 11.5*  HCT 41.1   < > 33.9* 33.5* 32.8* 33.6* 34.6*  MCV 93.0  --  93.6 94.9 92.9 93.3 91.5  PLT 206  --  159 143* 185 195 240   < > = values in this interval not displayed.    Basic Metabolic Panel: Recent Labs  Lab 10/29/24 0115 10/30/24 0502 11/01/24 0558 11/02/24 0549 11/03/24 0656  NA 137 136 138 142 142  K 4.1 3.7 4.2 3.8 3.9  CL 106 104 105 106 106  CO2 21* 21* 24 26 27   GLUCOSE 81 76 79 95 82  BUN 23 18 13 11 9   CREATININE 1.53* 1.33* 1.03* 0.91 0.91  CALCIUM  8.2* 8.6* 9.0 9.0 9.3  MG 2.0  --   --   --   --   PHOS 4.2  --   --   --   --     GFR: Estimated Creatinine Clearance: 78.7 mL/min (by C-G formula based on SCr of 0.91 mg/dL).  Liver Function Tests: Recent Labs  Lab 10/28/24 1923 10/29/24 0115 10/30/24 0502  11/01/24 0558  AST 45* 32 23 26  ALT 52* 40 29 30  ALKPHOS 133* 105 94 111  BILITOT 1.7* 1.2 0.7 0.6  PROT 7.5 5.9* 5.8* 6.4*  ALBUMIN 3.9 3.1* 2.9* 3.2*    CBG: No results for input(s): GLUCAP in the last 168 hours.   Recent Results (from the past 240 hours)  Blood Culture (routine x 2)     Status: None   Collection Time: 10/28/24  7:20 PM   Specimen: BLOOD RIGHT WRIST  Result Value Ref Range Status   Specimen Description   Final    BLOOD RIGHT WRIST Performed at [facility], [address] [place], [place] [address]    Special Requests   Final    BOTTLES DRAWN AEROBIC AND ANAEROBIC Blood Culture adequate volume Performed at [facility], [address] [place], [place] [address]    Culture   Final    NO GROWTH 5 DAYS Performed at [facility], [address], [place], [place] [address]    Report Status 11/03/2024 FINAL  Final  Blood Culture (routine x 2)     Status: None   Collection Time: 10/28/24  7:20 PM   Specimen: Left Antecubital; Blood  Result Value Ref Range Status   Specimen Description   Final    LEFT ANTECUBITAL Performed at [facility], [address] [place], [place] [address]    Special Requests   Final    BOTTLES DRAWN AEROBIC AND ANAEROBIC Blood Culture adequate volume Performed at [facility], [address] [place], [place] [address]    Culture   Final    NO GROWTH 5 DAYS Performed at [facility], [address] [place], [place] [address]    Report Status 11/03/2024 FINAL  Final  Resp panel by RT-PCR (RSV, Flu A&B, Covid) Peripheral     Status: None   Collection Time: 10/28/24  7:39 PM   Specimen: Peripheral; Nasal Swab  Result Value Ref Range Status   SARS Coronavirus 2 by RT PCR NEGATIVE NEGATIVE Final  Comment: (NOTE) SARS-CoV-2 target nucleic acids are NOT DETECTED.  The SARS-CoV-2 RNA is generally detectable in upper  respiratory specimens during the acute phase of infection. The lowest concentration of SARS-CoV-2 viral copies this assay can detect is 138 copies/mL. A negative result does not preclude SARS-Cov-2 infection and should not be used as the sole basis for treatment or other patient management decisions. A negative result may occur with  improper specimen collection/handling, submission of specimen other than nasopharyngeal swab, presence of viral mutation(s) within the areas targeted by this assay, and inadequate number of viral copies(<138 copies/mL). A negative result must be combined with clinical observations, patient history, and epidemiological information. The expected result is Negative.  Fact Sheet for Patients:  bloggercourse.com  Fact Sheet for Healthcare Providers:  seriousbroker.it  This test is no t yet approved or cleared by the [place]  FDA and  has been authorized for detection and/or diagnosis of SARS-CoV-2 by FDA under an Emergency Use Authorization (EUA). This EUA will remain  in effect (meaning this test can be used) for the duration of the COVID-19 declaration under Section 564(b)(1) of the Act, 21 U.S.C.section 360bbb-3(b)(1), unless the authorization is terminated  or revoked sooner.       Influenza A by PCR NEGATIVE NEGATIVE Final   Influenza B by PCR NEGATIVE NEGATIVE Final    Comment: (NOTE) The Xpert Xpress SARS-CoV-2/FLU/RSV plus assay is intended as an aid in the diagnosis of influenza from Nasopharyngeal swab specimens and should not be used as a sole basis for treatment. Nasal washings and aspirates are unacceptable for Xpert Xpress SARS-CoV-2/FLU/RSV testing.  Fact Sheet for Patients: bloggercourse.com  Fact Sheet for Healthcare Providers: seriousbroker.it  This test is not yet approved or cleared by the [place]  FDA and has been  authorized for detection and/or diagnosis of SARS-CoV-2 by FDA under an Emergency Use Authorization (EUA). This EUA will remain in effect (meaning this test can be used) for the duration of the COVID-19 declaration under Section 564(b)(1) of the Act, 21 U.S.C. section 360bbb-3(b)(1), unless the authorization is terminated or revoked.     Resp Syncytial Virus by PCR NEGATIVE NEGATIVE Final    Comment: (NOTE) Fact Sheet for Patients: bloggercourse.com  Fact Sheet for Healthcare Providers: seriousbroker.it  This test is not yet approved or cleared by the [place]  FDA and has been authorized for detection and/or diagnosis of SARS-CoV-2 by FDA under an Emergency Use Authorization (EUA). This EUA will remain in effect (meaning this test can be used) for the duration of the COVID-19 declaration under Section 564(b)(1) of the Act, 21 U.S.C. section 360bbb-3(b)(1), unless the authorization is terminated or revoked.  Performed at [facility], [address], [place], [place] [address]   Urine Culture     Status: None   Collection Time: 10/28/24 11:09 PM   Specimen: Urine, Random  Result Value Ref Range Status   Specimen Description   Final    URINE, RANDOM Performed at [facility], [address] [place], [place] [address]    Special Requests   Final    NONE Reflexed from [id number] Performed at [facility], [address] [place], [place] [address]    Culture   Final    NO GROWTH Performed at [facility], [address], [place], [place] [address]    Report Status 10/30/2024 FINAL  Final  Urine Culture (for pregnant, neutropenic or urologic patients or patients with an indwelling urinary catheter)  Status: None   Collection Time: 10/30/24  3:07 PM   Specimen: Urine, Clean Catch  Result Value Ref Range Status   Specimen Description   Final    URINE, CLEAN  CATCH Performed at [facility], [address], [place], [place] [address]    Special Requests   Final    NONE Performed at [facility], [address] [place], [place] [address]    Culture   Final    NO GROWTH Performed at [facility], [address] [place], [place] [address]    Report Status 10/31/2024 FINAL  Final         Radiology Studies: MR Lumbar Spine W Wo Contrast Result Date: 11/02/2024 CLINICAL DATA:  Initial evaluation for acute fever of unknown origin, right flank/back pain. History of colon cancer. EXAM: MRI LUMBAR SPINE WITHOUT AND WITH CONTRAST TECHNIQUE: Multiplanar and multiecho pulse sequences of the lumbar spine were obtained without and with intravenous contrast. CONTRAST:  9mL GADAVIST  GADOBUTROL  1 MMOL/ML IV SOLN COMPARISON:  Comparison made with prior CT from 10/28/2024. FINDINGS: Segmentation: Standard. Lowest well-formed disc space labeled the L5-S1 level. 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 Exaggeration of the normal lumbar lordosis elsewhere. Vertebrae: Vertebral body height maintained without acute or chronic fracture. Bone marrow signal intensity within normal limits. Increased T1/T2 signal intensity within the L5 vertebral body and sacrum, likely reflecting post treatment changes. No worrisome osseous lesions. No evidence for osteomyelitis discitis or septic arthritis. Conus medullaris and cauda equina: Conus extends to the L1 level. Conus and cauda equina appear normal. Paraspinal and other soft tissues: Few small perineural cysts noted along the S2 nerve root. Paraspinous soft tissues demonstrate no acute inflammatory changes or other abnormality. Small volume free fluid noted within the visualized presacral space. No loculated collections. Cholelithiasis noted. Disc levels: T12-L1: Right paracentral disc protrusion indents the right  ventral thecal sac (series 8, image 10). No significant spinal stenosis. Foramina remain patent. L1-2:  Unremarkable. L2-3: Disc desiccation with mild disc bulge. Mild right greater left facet hypertrophy. No spinal stenosis. Mild bilateral L2 foraminal narrowing. L3-4: Disc desiccation with mild left eccentric disc bulge. Mild bilateral facet hypertrophy. Borderline mild narrowing of the right lateral recess. Central canal remains patent. No significant foraminal stenosis. L4-5: Minimal disc bulge. Moderate bilateral facet arthrosis. Borderline narrowing of the lateral recesses bilaterally. Central canal remains patent. Mild bilateral L4 foraminal stenosis. L5-S1: Chronic bilateral pars defects with 16 mm spondylolisthesis. Degenerative intervertebral disc space narrowing with disc desiccation and broad posterior pseudo disc bulge/uncovering. Reactive endplate changes. Moderate bilateral facet arthrosis. No significant spinal stenosis. Moderate bilateral L5 foraminal narrowing. IMPRESSION: 1. No MRI evidence for acute infection within the lumbar spine. 2. 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 3. Additional mild spondylosis and facet hypertrophy elsewhere within the lumbar spine as above. No other significant stenosis or neural impingement. Electronically Signed   By: Morene Hoard M.D.   On: 11/02/2024 23:41   ECHOCARDIOGRAM COMPLETE Result Date: 11/02/2024    ECHOCARDIOGRAM REPORT   Patient Name:   Riven CUMMINGS [place]brooks Date of Exam: 11/02/2024 Medical Rec #:  [id number]            Height:       68.0 in Accession #:    [id number]  Weight:       212.0 lb Date of Birth:  08-25-1961            BSA:          2.095 m Patient Age:    63 years             BP:           105/54 mmHg Patient Gender: F                    HR:           78 bpm. Exam Location:  Inpatient Procedure: 2D Echo, Cardiac  Doppler, Color Doppler and Intracardiac            Opacification Agent (Both Spectral and Color Flow Doppler were            utilized during procedure). Indications:    Fever  History:        Patient has no prior history of Echocardiogram examinations.  Sonographer:    Philomena Daring Referring Phys: [id number] CORNELIUS N VAN DAM IMPRESSIONS  1. Left ventricular ejection fraction, by estimation, is 60 to 65%. The left ventricle has normal function. The left ventricle has no regional wall motion abnormalities. Left ventricular diastolic parameters were normal.  2. Right ventricular systolic function is normal. The right ventricular size is normal. Tricuspid regurgitation signal is inadequate for assessing PA pressure.  3. There is no evidence of pericardial effusion.  4. The mitral valve is normal in structure. Mild mitral valve regurgitation. No evidence of mitral stenosis.  5. The aortic valve is normal in structure. Aortic valve regurgitation is trivial. No aortic stenosis is present.  6. The inferior vena cava is normal in size with greater than 50% respiratory variability, suggesting right atrial pressure of 3 mmHg. Conclusion(s)/Recommendation(s): No evidence of valvular vegetations on this transthoracic echocardiogram. Consider a transesophageal echocardiogram to exclude infective endocarditis if clinically indicated. FINDINGS  Left Ventricle: Left ventricular ejection fraction, by estimation, is 60 to 65%. The left ventricle has normal function. The left ventricle has no regional wall motion abnormalities. Definity  contrast agent was given IV to delineate the left ventricular  endocardial borders. The left ventricular internal cavity size was normal in size. There is no left ventricular hypertrophy. Left ventricular diastolic parameters were normal. Normal left ventricular filling pressure. Right Ventricle: The right ventricular size is normal. No increase in right ventricular wall thickness. Right ventricular  systolic function is normal. Tricuspid regurgitation signal is inadequate for assessing PA pressure. Left Atrium: Left atrial size was normal in size. Right Atrium: Right atrial size was normal in size. Pericardium: There is no evidence of pericardial effusion. Mitral Valve: The mitral valve is normal in structure. Mild mitral valve regurgitation. No evidence of mitral valve stenosis. Tricuspid Valve: The tricuspid valve is normal in structure. Tricuspid valve regurgitation is trivial. No evidence of tricuspid stenosis. Aortic Valve: The aortic valve is normal in structure. Aortic valve regurgitation is trivial. No aortic stenosis is present. Pulmonic Valve: The pulmonic valve was normal in structure. Pulmonic valve regurgitation is not visualized. No evidence of pulmonic stenosis. Aorta: The aortic root and ascending aorta are structurally normal, with no evidence of dilitation. Venous: The inferior vena cava is normal in size with greater than 50% respiratory variability, suggesting right atrial pressure of 3 mmHg. IAS/Shunts: No atrial level shunt detected by color flow Doppler.  LEFT VENTRICLE PLAX 2D LVIDd:  5.00 cm   Diastology LVIDs:         3.20 cm   LV e' medial:    8.05 cm/s LV PW:         1.00 cm   LV E/e' medial:  13.4 LV IVS:        0.90 cm   LV e' lateral:   9.57 cm/s LVOT diam:     1.90 cm   LV E/e' lateral: 11.3 LV SV:         65 LV SV Index:   31 LVOT Area:     2.84 cm LV IVRT:       53 msec  RIGHT VENTRICLE             IVC RV Basal diam:  3.50 cm     IVC diam: 1.80 cm RV Mid diam:    2.90 cm RV S prime:     13.70 cm/s TAPSE (M-mode): 2.4 cm LEFT ATRIUM             Index        RIGHT ATRIUM           Index LA diam:        3.70 cm 1.77 cm/m   RA Area:     17.30 cm LA Vol (A2C):   49.4 ml 23.58 ml/m  RA Volume:   47.50 ml  22.67 ml/m LA Vol (A4C):   53.1 ml 25.35 ml/m LA Biplane Vol: 54.5 ml 26.01 ml/m  AORTIC VALVE LVOT Vmax:   127.00 cm/s LVOT Vmean:  85.100 cm/s LVOT VTI:    0.231 m   AORTA Ao Root diam: 3.00 cm Ao Asc diam:  3.10 cm MITRAL VALVE MV Area (PHT): 5.09 cm     SHUNTS MV Decel Time: 149 msec     Systemic VTI:  0.23 m MV E velocity: 108.00 cm/s  Systemic Diam: 1.90 cm MV A velocity: 91.80 cm/s MV E/A ratio:  1.18 Wilbert Bihari MD Electronically signed by Wilbert Bihari MD Signature Date/Time: 11/02/2024/11:22:04 AM    Final         Scheduled Meds:  alprazolam   2 mg Oral q morning   bisacodyl   10 mg Oral Daily   buPROPion   300 mg Oral q morning   chlorhexidine   15 mL Mouth/Throat BID   enoxaparin  (LOVENOX ) injection  40 mg Subcutaneous Q24H   polyethylene glycol  17 g Oral BID   rOPINIRole   0.25 mg Oral QHS   senna-docusate  1 tablet Oral BID   Continuous Infusions:   LOS: 6 days    Time spent: 35 minutes    Toribio Hummer, MD [facility]   To contact the attending provider between 7A-7P or the covering provider during after hours 7P-7A, please log into the web site [email or web address] and access using universal [place] password for that web site. If you do not have the password, please call the hospital operator.  11/03/2024, 10:50 AM    "

## 2024-11-03 NOTE — Progress Notes (Signed)
" ° ° ° ° ° °  The patient has had MRI hip and L spine and these reads are back  There is ZERO infectious pathology identified on these studies  She has had an exhaustive workup for FUO  She has had more than adequete therapy for a UTI if she truly had one in the first place  She has been afebrile off abxx for several days  I will sign off  Please call with further questions.   Jomarie Fleeta Rothman 11/03/2024, 4:34 PM  "

## 2024-11-04 ENCOUNTER — Other Ambulatory Visit (HOSPITAL_COMMUNITY): Payer: Self-pay

## 2024-11-04 DIAGNOSIS — E66811 Obesity, class 1: Secondary | ICD-10-CM

## 2024-11-04 MED ORDER — MIRABEGRON ER 25 MG PO TB24
25.0000 mg | ORAL_TABLET | Freq: Every day | ORAL | 0 refills | Status: DC
  Filled 2024-11-04: qty 30, 30d supply, fill #0

## 2024-11-04 MED ORDER — TRAMADOL HCL 50 MG PO TABS
50.0000 mg | ORAL_TABLET | Freq: Four times a day (QID) | ORAL | Status: DC | PRN
  Administered 2024-11-05: 50 mg via ORAL
  Filled 2024-11-04: qty 1

## 2024-11-04 NOTE — Assessment & Plan Note (Signed)
 Resolved

## 2024-11-04 NOTE — Discharge Summary (Signed)
 " Physician Discharge Summary   Patient: Norma Colon MRN: [id number] DOB: October 28, 1960  Admit date:     10/28/2024  Discharge date: 11/05/24  Discharge Physician: Elidia Sieving Hagan Vanauken   PCP: Mat Browning, MD   Recommendations at discharge:    Patient currently afebrile, completed antibiotic therapy  Will need pre authorization for mirabegron  as outpatient  Follow up with Dr Mat in 7 to 10 days   Discharge Diagnoses: Principal Problem:   Right flank pain Active Problems:   Acute kidney injury   Right ureteral injury   FUO (fever of unknown origin)   Edentulous   Chronic flank pain   Constipation   Restless leg syndrome   Anxiety and depression  Resolved Problems:   * No resolved hospital problems. [facility] Course: Norma Colon was admitted to the hospital with the working diagnosis of fever.   64 yo female with the past medical history of rectal cancer, depression, chronic back pain, who presented with right flank pain and chills. Acute onset about 24 hrs prior to admission, because elevated temperature 102.5 at home despite ibuprofen, she decided to come to the ED. Recent uterus ablation and full dental extraction about 5 days prior to admission.  On her initial physical examination her blood pressure was 104/67, HR 67, RR 20 and 02 saturation 98%  Lungs with no wheezing or rhonchi, heart with S1 and S2 present and regular, abdomen with no distention and no lower extremity edema.   Na 137, K 3,4 Cl 102 bicarbonate 21 glucose 87 bun 25 cr 1,63  AST 45 ALT 52 Total bilirubin 1,7  Lactic acid 2,6  Wbc 11.6 hgb 13.4 plt 206  Sars COVID 19 negative Influenza negative RSV negative   Urine analysis SG >1,046, protein > 300, large leukocytes, small hgb, >50 wbc, 0-5 rbc  Urine and blood culture with no growth  CT abdomen and pelvis with no acute abnormality in the abdomen or pelvis. No abscess or obstructive uropathy.  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  Cholelithiasis  CT maxillofacial negative. Sequelae of recent total dental extraction   MRI thoracic spine with no acute or metastatic process identified in the thoracic spine.  Small bilateral pleural effusions.  MRI right hip with moderate osteoarthritis of the right hip with subcortical cyst and edema in the superior acetabulum and small hip  joint effusion.  Tear of the right anterosuperior labrum Partially visualized mild pelvic free fluid.  MRI lumbar spine with no evidence for acute infection within the lumbar spine.  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   EKG 82 bpm, normal axis, right bundle branch block, qtc 388, sinus rhythm with no significant ST segment or  T wave changes.   CT head with normal findings. No acute intracranial abnormality. No evidence for intracranial metastatic disease on this non contrast examination   US  upper extremities with no deep vein thrombosis Left age indeterminate superficial vein thrombosis involving a branch of the left basilic vein just proximal to the IV placement.  US  Lower extremities negative for deep vein thrombosis   Assessment and Plan: * FUO (fever of unknown origin) Urinary tract infection present on admission  Patient was placed on IV antibiotic therapy Patient has been afebrile No further antibiotic therapy indicated Urology recommended Myrbetriq , will need pre authorization to continue as outpatient Follow up with  Urology as outpatient  Acute kidney injury Patient was placed on IV fluids with improvement in renal function  At the time of discharge renal function with serum cr at 0,30 with K at 3,9 and serum bicarbonate at 27  Na 142   Chronic flank pain Back pain controlled with analgesics and muscle relaxants   Constipation Resolved   Restless leg syndrome Continue with  ropinirole    Obesity, class 1 Calculated BMI is 32.2       Consultants: Urology, ID  Procedures performed: none   Disposition: Home Diet recommendation:  Cardiac diet DISCHARGE MEDICATION: Allergies as of 11/04/2024       Reactions   Prednisone Other (See Comments)   Felt like she was crazy if on it more than 1 week   Codeine Other (See Comments)   Causes migraine--patient requests this be kept on allergy list        Medication List     STOP taking these medications    clindamycin 300 MG capsule Commonly known as: CLEOCIN   dexamethasone  2 MG tablet Commonly known as: DECADRON        TAKE these medications    rOPINIRole  0.25 MG tablet Commonly known as: REQUIP  Take 0.25 mg by mouth at bedtime. The timing of this medication is very important.   acetaminophen -codeine 300-30 MG tablet Commonly known as: TYLENOL  #3 Take 1 tablet by mouth every 4 (four) hours as needed. for pain   alprazolam  2 MG tablet Commonly known as: XANAX  Take 2 mg by mouth every morning.   buPROPion  300 MG 24 hr tablet Commonly known as: WELLBUTRIN  XL Take 300 mg by mouth every morning.   chlorhexidine  0.12 % solution Commonly known as: PERIDEX  Use as directed 15 mLs in the mouth or throat in the morning, at noon, and at bedtime.   cyclobenzaprine  10 MG tablet Commonly known as: FLEXERIL  Take 20 mg by mouth at bedtime.   ibuprofen 400 MG tablet Commonly known as: ADVIL TAKE 1 TABLET BY MOUTH FOUR TIMES DAILY WITH FOOD   traMADol  50 MG tablet Commonly known as: ULTRAM  Take 50 mg by mouth every 6 (six) hours as needed.        Discharge Exam: [name] Weights   10/28/24 1838 10/28/24 2108  Weight: 97.5 kg 96.2 kg   BP 117/82 (BP Location: Right Arm)   Pulse 80   Temp 98.1 F (36.7 C)   Resp 18   Ht 5' 8 (1.727 m)   Wt 96.2 kg   LMP  (LMP Unknown)   SpO2 96%   BMI 32.23 kg/m   Neurology awake and alert ENT with mild pallor with no icterus Cardiovascular with  S1 and S2 present and regular with no gallops, or rubs Respiratory with no rales or wheezing Abdomen with no distention  No lower extremity edema   Condition at discharge: stable  The results of significant diagnostics from this hospitalization (including imaging, microbiology, ancillary and laboratory) are listed below for reference.   Imaging Studies: MR HIP RIGHT W WO CONTRAST Result Date: 11/03/2024 CLINICAL DATA:  Fever of unknown origin.  Pain. EXAM: MRI OF THE RIGHT HIP WITHOUT AND WITH CONTRAST TECHNIQUE: Multiplanar, multisequence MR imaging was performed both before and after administration of intravenous contrast. CONTRAST:  9mL GADAVIST  GADOBUTROL  1 MMOL/ML IV SOLN COMPARISON:  CT abdomen/pelvis dated 10/28/2024. FINDINGS: Soft tissue and Muscle: Muscle bulk is age-appropriate and appears relatively symmetric bilaterally. No significant intramuscular edema. Hamstring origins are intact. Gluteal cuff tendon insertions are  intact. No significant bursitis. Partially visualized mild pelvic free fluid. No enlarged lymph nodes identified in the field of view. Bones: No acute osseous abnormality. No avascular necrosis. No aggressive osseous lesion. Mild degenerative changes of the sacroiliac joints and pubic symphysis. Hip: The hip joint is anatomically aligned. Superolateral joint space narrowing and marginal osteophytosis. Subcortical cyst and edema in the superior acetabulum. Thinning of the superior weight-bearing articular cartilage. Tear of the anterosuperior labrum. Small hip joint effusion. IMPRESSION: 1. Moderate osteoarthritis of the right hip with subcortical cyst and edema in the superior acetabulum and small hip joint effusion. 2. Tear of the right anterosuperior labrum. 3. Partially visualized mild pelvic free fluid. Electronically Signed   By: Harrietta Sherry M.D.   On: 11/03/2024 13:10   MR Lumbar Spine W Wo Contrast Result Date: 11/02/2024 CLINICAL DATA:  Initial evaluation for  acute fever of unknown origin, right flank/back pain. History of colon cancer. EXAM: MRI LUMBAR SPINE WITHOUT AND WITH CONTRAST TECHNIQUE: Multiplanar and multiecho pulse sequences of the lumbar spine were obtained without and with intravenous contrast. CONTRAST:  9mL GADAVIST  GADOBUTROL  1 MMOL/ML IV SOLN COMPARISON:  Comparison made with prior CT from 10/28/2024. FINDINGS: Segmentation: Standard. Lowest well-formed disc space labeled the L5-S1 level. 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 Exaggeration of the normal lumbar lordosis elsewhere. Vertebrae: Vertebral body height maintained without acute or chronic fracture. Bone marrow signal intensity within normal limits. Increased T1/T2 signal intensity within the L5 vertebral body and sacrum, likely reflecting post treatment changes. No worrisome osseous lesions. No evidence for osteomyelitis discitis or septic arthritis. Conus medullaris and cauda equina: Conus extends to the L1 level. Conus and cauda equina appear normal. Paraspinal and other soft tissues: Few small perineural cysts noted along the S2 nerve root. Paraspinous soft tissues demonstrate no acute inflammatory changes or other abnormality. Small volume free fluid noted within the visualized presacral space. No loculated collections. Cholelithiasis noted. Disc levels: T12-L1: Right paracentral disc protrusion indents the right ventral thecal sac (series 8, image 10). No significant spinal stenosis. Foramina remain patent. L1-2:  Unremarkable. L2-3: Disc desiccation with mild disc bulge. Mild right greater left facet hypertrophy. No spinal stenosis. Mild bilateral L2 foraminal narrowing. L3-4: Disc desiccation with mild left eccentric disc bulge. Mild bilateral facet hypertrophy. Borderline mild narrowing of the right lateral recess. Central canal remains patent. No significant foraminal stenosis. L4-5: Minimal disc bulge.  Moderate bilateral facet arthrosis. Borderline narrowing of the lateral recesses bilaterally. Central canal remains patent. Mild bilateral L4 foraminal stenosis. L5-S1: Chronic bilateral pars defects with 16 mm spondylolisthesis. Degenerative intervertebral disc space narrowing with disc desiccation and broad posterior pseudo disc bulge/uncovering. Reactive endplate changes. Moderate bilateral facet arthrosis. No significant spinal stenosis. Moderate bilateral L5 foraminal narrowing. IMPRESSION: 1. No MRI evidence for acute infection within the lumbar spine. 2. 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 3. Additional mild spondylosis and facet hypertrophy elsewhere within the lumbar spine as above. No other significant stenosis or neural impingement. Electronically Signed   By: Morene Hoard M.D.   On: 11/02/2024 23:41   ECHOCARDIOGRAM COMPLETE Result Date: 11/02/2024    ECHOCARDIOGRAM REPORT   Patient Name:   Cartina CUMMINGS Chapa Date of Exam: 11/02/2024 Medical Rec #:  [id number]            Height:  68.0 in Accession #:    [id number]           Weight:       212.0 lb Date of Birth:  1960/10/17            BSA:          2.095 m Patient Age:    63 years             BP:           105/54 mmHg Patient Gender: F                    HR:           78 bpm. Exam Location:  Inpatient Procedure: 2D Echo, Cardiac Doppler, Color Doppler and Intracardiac            Opacification Agent (Both Spectral and Color Flow Doppler were            utilized during procedure). Indications:    Fever  History:        Patient has no prior history of Echocardiogram examinations.  Sonographer:    Philomena Daring Referring Phys: [id number] CORNELIUS N VAN DAM IMPRESSIONS  1. Left ventricular ejection fraction, by estimation, is 60 to 65%. The left ventricle has normal function. The left ventricle has no regional wall motion abnormalities. Left  ventricular diastolic parameters were normal.  2. Right ventricular systolic function is normal. The right ventricular size is normal. Tricuspid regurgitation signal is inadequate for assessing PA pressure.  3. There is no evidence of pericardial effusion.  4. The mitral valve is normal in structure. Mild mitral valve regurgitation. No evidence of mitral stenosis.  5. The aortic valve is normal in structure. Aortic valve regurgitation is trivial. No aortic stenosis is present.  6. The inferior vena cava is normal in size with greater than 50% respiratory variability, suggesting right atrial pressure of 3 mmHg. Conclusion(s)/Recommendation(s): No evidence of valvular vegetations on this transthoracic echocardiogram. Consider a transesophageal echocardiogram to exclude infective endocarditis if clinically indicated. FINDINGS  Left Ventricle: Left ventricular ejection fraction, by estimation, is 60 to 65%. The left ventricle has normal function. The left ventricle has no regional wall motion abnormalities. Definity  contrast agent was given IV to delineate the left ventricular  endocardial borders. The left ventricular internal cavity size was normal in size. There is no left ventricular hypertrophy. Left ventricular diastolic parameters were normal. Normal left ventricular filling pressure. Right Ventricle: The right ventricular size is normal. No increase in right ventricular wall thickness. Right ventricular systolic function is normal. Tricuspid regurgitation signal is inadequate for assessing PA pressure. Left Atrium: Left atrial size was normal in size. Right Atrium: Right atrial size was normal in size. Pericardium: There is no evidence of pericardial effusion. Mitral Valve: The mitral valve is normal in structure. Mild mitral valve regurgitation. No evidence of mitral valve stenosis. Tricuspid Valve: The tricuspid valve is normal in structure. Tricuspid valve regurgitation is trivial. No evidence of tricuspid  stenosis. Aortic Valve: The aortic valve is normal in structure. Aortic valve regurgitation is trivial. No aortic stenosis is present. Pulmonic Valve: The pulmonic valve was normal in structure. Pulmonic valve regurgitation is not visualized. No evidence of pulmonic stenosis. Aorta: The aortic root and ascending aorta are structurally normal, with no evidence of dilitation. Venous: The inferior vena cava is normal in size with greater than 50% respiratory variability, suggesting right atrial pressure of 3 mmHg. IAS/Shunts: No atrial level shunt detected  by color flow Doppler.  LEFT VENTRICLE PLAX 2D LVIDd:         5.00 cm   Diastology LVIDs:         3.20 cm   LV e' medial:    8.05 cm/s LV PW:         1.00 cm   LV E/e' medial:  13.4 LV IVS:        0.90 cm   LV e' lateral:   9.57 cm/s LVOT diam:     1.90 cm   LV E/e' lateral: 11.3 LV SV:         65 LV SV Index:   31 LVOT Area:     2.84 cm LV IVRT:       53 msec  RIGHT VENTRICLE             IVC RV Basal diam:  3.50 cm     IVC diam: 1.80 cm RV Mid diam:    2.90 cm RV S prime:     13.70 cm/s TAPSE (M-mode): 2.4 cm LEFT ATRIUM             Index        RIGHT ATRIUM           Index LA diam:        3.70 cm 1.77 cm/m   RA Area:     17.30 cm LA Vol (A2C):   49.4 ml 23.58 ml/m  RA Volume:   47.50 ml  22.67 ml/m LA Vol (A4C):   53.1 ml 25.35 ml/m LA Biplane Vol: 54.5 ml 26.01 ml/m  AORTIC VALVE LVOT Vmax:   127.00 cm/s LVOT Vmean:  85.100 cm/s LVOT VTI:    0.231 m  AORTA Ao Root diam: 3.00 cm Ao Asc diam:  3.10 cm MITRAL VALVE MV Area (PHT): 5.09 cm     SHUNTS MV Decel Time: 149 msec     Systemic VTI:  0.23 m MV E velocity: 108.00 cm/s  Systemic Diam: 1.90 cm MV A velocity: 91.80 cm/s MV E/A ratio:  1.18 Wilbert Bihari MD Electronically signed by Wilbert Bihari MD Signature Date/Time: 11/02/2024/11:22:04 AM    Final    VAS US  UPPER EXTREMITY VENOUS DUPLEX Result Date: 11/01/2024 UPPER VENOUS STUDY  Patient Name:  Azlee CUMMINGS Jaggi  Date of Exam:   11/01/2024 Medical Rec  #: [id number]             Accession #:    [id number] Date of Birth: [birth year]             Patient Gender: F Patient Age:   89 years Exam Location:  [facility] Procedure:      VAS US  UPPER EXTREMITY VENOUS DUPLEX Referring Phys: CORNELIUS VAN DAM --------------------------------------------------------------------------------  Indications: Fever of unknown origin Limitations: Poor ultrasound/tissue interface. Comparison Study: No previous exams Performing Technologist: Jody Hill RVT, RDMS  Examination Guidelines: A complete evaluation includes B-mode imaging, spectral Doppler, color Doppler, and power Doppler as needed of all accessible portions of each vessel. Bilateral testing is considered an integral part of a complete examination. Limited examinations for reoccurring indications may be performed as noted.  Right Findings: +----------+------------+---------+-----------+----------+-------+ RIGHT     CompressiblePhasicitySpontaneousPropertiesSummary +----------+------------+---------+-----------+----------+-------+ IJV           Full       Yes       Yes                      +----------+------------+---------+-----------+----------+-------+  Subclavian    Full       Yes       Yes                      +----------+------------+---------+-----------+----------+-------+ Axillary      Full       Yes       Yes                      +----------+------------+---------+-----------+----------+-------+ Brachial      Full       Yes       Yes                      +----------+------------+---------+-----------+----------+-------+ Radial        Full                                          +----------+------------+---------+-----------+----------+-------+ Ulnar         Full                                          +----------+------------+---------+-----------+----------+-------+ Cephalic      Full                                           +----------+------------+---------+-----------+----------+-------+ Basilic       Full       Yes       Yes                      +----------+------------+---------+-----------+----------+-------+  Left Findings: +----------+------------+---------+-----------+----------+-------------------+ LEFT      CompressiblePhasicitySpontaneousProperties      Summary       +----------+------------+---------+-----------+----------+-------------------+ IJV           Full       Yes       Yes                                  +----------+------------+---------+-----------+----------+-------------------+ Subclavian    Full       Yes       Yes                                  +----------+------------+---------+-----------+----------+-------------------+ Axillary      Full       Yes       Yes                                  +----------+------------+---------+-----------+----------+-------------------+ Brachial      Full       Yes       Yes                                  +----------+------------+---------+-----------+----------+-------------------+ Radial        Full                                                      +----------+------------+---------+-----------+----------+-------------------+  Ulnar                                               not well visualized +----------+------------+---------+-----------+----------+-------------------+ Cephalic      Full                                                      +----------+------------+---------+-----------+----------+-------------------+ Basilic       Full       Yes       Yes                                  +----------+------------+---------+-----------+----------+-------------------+ Limited visualization of radial and ulnar veins due to wrapped IV placement. Ulnar not well visualized due to size. Visualized areas appear patent. Basilic vein is patent, however a branch off basilic at [facility] level is thrombosed just  proximal to IV placement.  Summary: No evidence of deep vein thrombosis involving the right and left upper extremities. Right: No evidence of superficial vein thrombosis in the upper extremity.  Left: Findings consistent with age indeterminate superficial vein thrombosis involving a branch ofthe left basilic vein just proximal to IV placement.  *See table(s) above for measurements and observations.  Diagnosing physician: Debby Robertson Electronically signed by Debby Robertson on 11/01/2024 at 4:11:59 PM.    Final    VAS US  LOWER EXTREMITY VENOUS (DVT) Result Date: 11/01/2024  Lower Venous DVT Study Patient Name:  [facility] Balke  Date of Exam:   11/01/2024 Medical Rec #: [id number]             Accession #:    [id number] Date of Birth: June 16, 1961             Patient Gender: F Patient Age:   75 years Exam Location:  [facility] Procedure:      VAS US  LOWER EXTREMITY VENOUS (DVT) Referring Phys: CORNELIUS VAN DAM --------------------------------------------------------------------------------  Indications: Fever of unknown origin.  Comparison Study: No previous exams Performing Technologist: Jody Hill RVT, RDMS  Examination Guidelines: A complete evaluation includes B-mode imaging, spectral Doppler, color Doppler, and power Doppler as needed of all accessible portions of each vessel. Bilateral testing is considered an integral part of a complete examination. Limited examinations for reoccurring indications may be performed as noted. The reflux portion of the exam is performed with the patient in reverse Trendelenburg.  +---------+---------------+---------+-----------+----------+--------------+ RIGHT    CompressibilityPhasicitySpontaneityPropertiesThrombus Aging +---------+---------------+---------+-----------+----------+--------------+ CFV      Full           Yes      Yes                                 +---------+---------------+---------+-----------+----------+--------------+ SFJ      Full                                                         +---------+---------------+---------+-----------+----------+--------------+ FV Prox  Full  Yes      Yes                                 +---------+---------------+---------+-----------+----------+--------------+ FV Mid   Full           Yes      Yes                                 +---------+---------------+---------+-----------+----------+--------------+ FV DistalFull           Yes      Yes                                 +---------+---------------+---------+-----------+----------+--------------+ PFV      Full                                                        +---------+---------------+---------+-----------+----------+--------------+ POP      Full           Yes      Yes                                 +---------+---------------+---------+-----------+----------+--------------+ PTV      Full                                                        +---------+---------------+---------+-----------+----------+--------------+ PERO     Full                                                        +---------+---------------+---------+-----------+----------+--------------+   +---------+---------------+---------+-----------+-------------+--------------+ LEFT     CompressibilityPhasicitySpontaneityProperties   Thrombus Aging +---------+---------------+---------+-----------+-------------+--------------+ CFV      Full           Yes      Yes                                    +---------+---------------+---------+-----------+-------------+--------------+ SFJ      Full                                                           +---------+---------------+---------+-----------+-------------+--------------+ FV Prox  Full           Yes      Yes                                    +---------+---------------+---------+-----------+-------------+--------------+ FV Mid   Full  Yes       Yes                                    +---------+---------------+---------+-----------+-------------+--------------+ FV DistalFull           Yes      Yes                                    +---------+---------------+---------+-----------+-------------+--------------+ PFV      Full                                                           +---------+---------------+---------+-----------+-------------+--------------+ POP      Full           Yes      Yes        rouleaux flow               +---------+---------------+---------+-----------+-------------+--------------+ PTV      Full                                                           +---------+---------------+---------+-----------+-------------+--------------+ PERO     Full                                                           +---------+---------------+---------+-----------+-------------+--------------+     Summary: BILATERAL: - No evidence of deep vein thrombosis seen in the lower extremities, bilaterally. -No evidence of popliteal cyst, bilaterally.   *See table(s) above for measurements and observations. Electronically signed by Debby Robertson on 11/01/2024 at 4:05:59 PM.    Final    CT MAXILLOFACIAL W CONTRAST Result Date: 10/31/2024 CLINICAL DATA:  Initial evaluation for nasal abscess. EXAM: CT MAXILLOFACIAL WITH CONTRAST TECHNIQUE: Multidetector CT imaging of the maxillofacial structures was performed with intravenous contrast. Multiplanar CT image reconstructions were also generated. RADIATION DOSE REDUCTION: This exam was performed according to the departmental dose-optimization program which includes automated exposure control, adjustment of the mA and/or kV according to patient size and/or use of iterative reconstruction technique. CONTRAST:  75mL OMNIPAQUE  IOHEXOL  300 MG/ML  SOLN COMPARISON:  None Available. FINDINGS: Osseous: No acute osseous abnormality about the face. No discrete or worrisome osseous  lesions. Sequelae of recent total dental extraction noted. Orbits: Globes and orbital soft tissues within normal limits. No evidence for intraorbital or postseptal cellulitis. Sinuses: Mild mucosal thickening about the inferior aspects of the maxillary sinuses bilaterally. Paranasal sinuses are otherwise largely clear. Mild right-to-left nasal septal deviation noted. Mastoid air cells and middle ear cavities are clear. Soft tissues: No significant soft tissue swelling or inflammatory changes seen about the face. No discrete abscess or drainable fluid collection. Salivary glands including the parotid and submandibular glands are within normal limits. No adenopathy within the visualized upper neck. Mild atheromatous plaque noted about  the left carotid bulb without stenosis. Limited intracranial: Unremarkable. IMPRESSION: 1. Negative maxillofacial CT. No significant soft tissue swelling or inflammatory changes seen about the face. No discrete abscess or drainable fluid collection. 2. Sequelae of recent total dental extraction. Electronically Signed   By: Morene Hoard M.D.   On: 10/31/2024 18:33   CT HEAD WO CONTRAST ( ) Result Date: 10/31/2024 CLINICAL DATA:  Initial evaluation for metastatic disease, migraine headache. EXAM: CT HEAD WITHOUT CONTRAST TECHNIQUE: Contiguous axial images were obtained from the base of the skull through the vertex without intravenous contrast. RADIATION DOSE REDUCTION: This exam was performed according to the departmental dose-optimization program which includes automated exposure control, adjustment of the mA and/or kV according to patient size and/or use of iterative reconstruction technique. COMPARISON:  None available. FINDINGS: Brain: Cerebral volume within normal limits. No acute intracranial hemorrhage. No acute large vessel territory infarct. No mass lesion or evidence for intracranial metastatic disease on this noncontrast examination. No mass effect or midline shift.  No hydrocephalus or extra-axial fluid collection. Vascular: No abnormal hyperdense vessel. Skull: Scalp soft tissues within normal limits. Calvarium intact. No visible focal osseous lesions. Sinuses/Orbits: Globes orbital soft tissues within normal limits. Paranasal sinuses and mastoid air cells are largely clear. Other: None. IMPRESSION: Normal head CT. No acute intracranial abnormality. No evidence for intracranial metastatic disease on this noncontrast examination. Electronically Signed   By: Morene Hoard M.D.   On: 10/31/2024 18:27   MR THORACIC SPINE W WO CONTRAST Result Date: 10/31/2024 EXAM: MRI THORACIC SPINE WITH AND WITHOUT INTRAVENOUS CONTRAST 10/31/2024 11:48:33 AM TECHNIQUE: Multiplanar multisequence MRI of the thoracic spine was performed with and without the administration of intravenous contrast. COMPARISON: Restaging CT chest, abdomen, and pelvis 10/01/2023. Recent CT abdomen and pelvis 10/29/2023. CLINICAL HISTORY: 64 year old female. Mid-back pain. Recent sepsis, flank pain, and metastatic colon cancer. FINDINGS: Normal thoracic segmentation on the comparison CT. Limited sagittal imaging of the cervical spine is negative aside from evidence of lower cervical disc and endplate degeneration including at C5-C6 and C6-C7. BONES AND ALIGNMENT: Normal alignment. Normal vertebral body heights. Small sclerotic bone island at the T5 vertebral body (series 18 image 10) appears stable since 2024 (no follow up imaging recommended). No thoracic marrow edema or vertebral enhancement. Background marrow signal is normal. SPINAL CORD: Normal spinal cord volume. Normal spinal cord signal. Capacious thoracic spinal canal. Normal conus medullaris at T12-L1. No abnormal intradural enhancement. No dural thickening. SOFT TISSUES: Small bilateral layering pleural effusions (series 21 image 17), new from recent CT abdomen. Negative thoracic paraspinal soft tissues. Partially visible confluent posterior  subcutaneous edema located posteriorly and beginning at the thoracolumbar junction continuing inferiorly (series 19 image 10). No adjacent paraspinal muscle edema. No associated abnormal soft tissue enhancement. DEGENERATIVE: Generally mild for age thoracic spine degeneration. No advanced disc degeneration. Small right paracentral disc bulge or protrusion at T12-L1 (series 20 image 37 and series 17 image 9). Mild to moderate lower thoracic posterior element hypertrophy including at T9-T10 and T10-T11. Small right paracentral disc bulge or protrusion at T12-L1 (series 20 image 37 and series 17 image 9). Moderate degenerative right side T9 and T10 neural foraminal stenosis appears chronic and related to facet hypertrophy. No thoracic spinal stenosis. IMPRESSION: 1. No acute or metastatic process identified in the thoracic spine. 2. Small bilateral pleural effusions, new from 10/29/2023 CT abdomen and pelvis. And partially visible posterior subcutaneous edema beginning at the thoracolumbar junction. 3. Generally mild for age thoracic spine degeneration, no thoracic spinal stenosis.  Electronically signed by: Helayne Hurst MD 10/31/2024 01:08 PM EST RP Workstation: [id number]   CT ABDOMEN PELVIS W CONTRAST Result Date: 10/28/2024 EXAM: CT ABDOMEN AND PELVIS WITH CONTRAST 10/28/2024 08:22:40 PM TECHNIQUE: CT of the abdomen and pelvis was performed with the administration of 75 mL of iohexol  (OMNIPAQUE ) 300 MG/ML solution. Multiplanar reformatted images are provided for review. Automated exposure control, iterative reconstruction, and/or weight-based adjustment of the mA/kV was utilized to reduce the radiation dose to as low as reasonably achievable. COMPARISON: MRI abdomen 08/03/2024 and 01/23/2024. CT chest abdomen and pelvis 10/01/2023. CLINICAL HISTORY: Right flank pain, sepsis. Fever, chills, and right flank pain since yesterday. FINDINGS: LOWER CHEST: No acute abnormality. Lung bases are clear. LIVER: Diffuse fatty  infiltration of the liver. Focal liver lesions including a heterogeneous hypodense lesion in segment 6 measuring 2.4 cm in diameter and a heterogeneous hypodense lesion in segment 2 measuring 1.3 cm diameter. No change in appearance with prior study. These lesions are consistent with known treated metastases. No new liver lesions are identified. GALLBLADDER AND BILE DUCTS: Cholelithiasis. No inflammatory stranding. No bile duct dilatation. SPLEEN: The spleen is unremarkable. PANCREAS: Pancreas is unremarkable. ADRENAL GLANDS: Adrenal glands are unremarkable. KIDNEYS, URETERS AND BLADDER: Kidneys, ureters, and the bladder are normal. GI AND BOWEL: Stool-filled colon. Stomach and small bowel are not abnormally distended. No wall thickening or inflammatory stranding. Appendix is normal. Surgical anastomosis in the sigmoid colon. PERITONEUM AND RETROPERITONEUM: No free air or free fluid in the abdomen. No loculated abscess collection. VASCULATURE: Normal caliber abdominal aorta. Minimal aortic calcification. LYMPH NODES: No retroperitoneal lymphadenopathy. REPRODUCTIVE ORGANS: The uterus is not enlarged. No abnormal adnexal masses. BONES AND SOFT TISSUES: Degenerative changes in the spine and hips. 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 No focal soft tissue abnormality. IMPRESSION: 1. No acute abnormality in the abdomen or pelvis. No abscess or obstructive uropathy. 2. 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 No new liver lesions. 3. Cholelithiasis Electronically signed by: Elsie Gravely MD 10/28/2024 08:31 PM EST RP Workstation: [id number]   DG Chest Port 1 View Result Date: 10/28/2024 EXAM: 1 VIEW(S) XRAY OF THE CHEST 10/28/2024 07:07:00 PM COMPARISON: None available. CLINICAL HISTORY: Fever. FINDINGS: LUNGS AND PLEURA: No focal pulmonary opacity. No pleural effusion. No pneumothorax. HEART AND MEDIASTINUM: No acute abnormality of the cardiac and mediastinal silhouettes.  BONES AND SOFT TISSUES: No acute osseous abnormality. IMPRESSION: 1. No acute process. Electronically signed by: Dorethia Molt MD 10/28/2024 07:16 PM EST RP Workstation: [id number]    Microbiology: Results for orders placed or performed during the hospital encounter of 10/28/24  Blood Culture (routine x 2)     Status: None   Collection Time: 10/28/24  7:20 PM   Specimen: BLOOD RIGHT WRIST  Result Value Ref Range Status   Specimen Description   Final    BLOOD RIGHT WRIST Performed at [facility], [address], [place], [place] [address]    Special Requests   Final    BOTTLES DRAWN AEROBIC AND ANAEROBIC Blood Culture adequate volume Performed at [facility], [address] [place], [place] [address]    Culture   Final    NO GROWTH 5 DAYS Performed at [facility], [address], [place], [place] [address]    Report Status 11/03/2024 FINAL  Final  Blood Culture (routine x 2)     Status: None   Collection Time: 10/28/24  7:20 PM   Specimen: Left Antecubital; Blood  Result  Value Ref Range Status   Specimen Description   Final    LEFT ANTECUBITAL Performed at [facility], [address] [place], [place] [address]    Special Requests   Final    BOTTLES DRAWN AEROBIC AND ANAEROBIC Blood Culture adequate volume Performed at [facility], [address], [place], [place] [address]    Culture   Final    NO GROWTH 5 DAYS Performed at [facility], [address] [place], [place] [address]    Report Status 11/03/2024 FINAL  Final  Resp panel by RT-PCR (RSV, Flu A&B, Covid) Peripheral     Status: None   Collection Time: 10/28/24  7:39 PM   Specimen: Peripheral; Nasal Swab  Result Value Ref Range Status   SARS Coronavirus 2 by RT PCR NEGATIVE NEGATIVE Final    Comment: (NOTE) SARS-CoV-2 target nucleic acids are NOT DETECTED.  The SARS-CoV-2 RNA is generally detectable in upper  respiratory specimens during the acute phase of infection. The lowest concentration of SARS-CoV-2 viral copies this assay can detect is 138 copies/mL. A negative result does not preclude SARS-Cov-2 infection and should not be used as the sole basis for treatment or other patient management decisions. A negative result may occur with  improper specimen collection/handling, submission of specimen other than nasopharyngeal swab, presence of viral mutation(s) within the areas targeted by this assay, and inadequate number of viral copies(<138 copies/mL). A negative result must be combined with clinical observations, patient history, and epidemiological information. The expected result is Negative.  Fact Sheet for Patients:  bloggercourse.com  Fact Sheet for Healthcare Providers:  seriousbroker.it  This test is no t yet approved or cleared by the [place]  FDA and  has been authorized for detection and/or diagnosis of SARS-CoV-2 by FDA under an Emergency Use Authorization (EUA). This EUA will remain  in effect (meaning this test can be used) for the duration of the COVID-19 declaration under Section 564(b)(1) of the Act, 21 U.S.C.section 360bbb-3(b)(1), unless the authorization is terminated  or revoked sooner.       Influenza A by PCR NEGATIVE NEGATIVE Final   Influenza B by PCR NEGATIVE NEGATIVE Final    Comment: (NOTE) The Xpert Xpress SARS-CoV-2/FLU/RSV plus assay is intended as an aid in the diagnosis of influenza from Nasopharyngeal swab specimens and should not be used as a sole basis for treatment. Nasal washings and aspirates are unacceptable for Xpert Xpress SARS-CoV-2/FLU/RSV testing.  Fact Sheet for Patients: bloggercourse.com  Fact Sheet for Healthcare Providers: seriousbroker.it  This test is not yet approved or cleared by the [place]  FDA and has been  authorized for detection and/or diagnosis of SARS-CoV-2 by FDA under an Emergency Use Authorization (EUA). This EUA will remain in effect (meaning this test can be used) for the duration of the COVID-19 declaration under Section 564(b)(1) of the Act, 21 U.S.C. section 360bbb-3(b)(1), unless the authorization is terminated or revoked.     Resp Syncytial Virus by PCR NEGATIVE NEGATIVE Final    Comment: (NOTE) Fact Sheet for Patients: bloggercourse.com  Fact Sheet for Healthcare Providers: seriousbroker.it  This test is not yet approved or cleared by the [place]  FDA and has been authorized for detection and/or diagnosis of SARS-CoV-2 by FDA under an Emergency Use Authorization (EUA). This EUA will remain in effect (meaning this test can be used) for the duration of the COVID-19 declaration under Section 564(b)(1) of the Act, 21 U.S.C. section 360bbb-3(b)(1), unless the authorization is terminated  or revoked.  Performed at [facility], [address], [place], [place] [address]   Urine Culture     Status: None   Collection Time: 10/28/24 11:09 PM   Specimen: Urine, Random  Result Value Ref Range Status   Specimen Description   Final    URINE, RANDOM Performed at [facility], [address] [place], [place] [address]    Special Requests   Final    NONE Reflexed from [id number] Performed at [facility], [address], [place], [place] [address]    Culture   Final    NO GROWTH Performed at [facility], [address], [place], [place] [address]    Report Status 10/30/2024 FINAL  Final  Urine Culture (for pregnant, neutropenic or urologic patients or patients with an indwelling urinary catheter)     Status: None   Collection Time: 10/30/24  3:07 PM   Specimen: Urine, Clean Catch  Result Value Ref Range Status   Specimen Description   Final    URINE, CLEAN  CATCH Performed at [facility], [address], [place], [place] [address]    Special Requests   Final    NONE Performed at [facility], [address] [place], [place] [address]    Culture   Final    NO GROWTH Performed at [facility], [address], [place], [place] [address]    Report Status 10/31/2024 FINAL  Final    Labs: CBC: Recent Labs  Lab 10/28/24 1923 10/28/24 1941 10/29/24 0115 10/30/24 0502 11/01/24 0558 11/02/24 0549 11/03/24 0656  WBC 11.6*  --  10.2 7.9 4.8 4.2 5.6  NEUTROABS 9.2*  --  8.5*  --   --  2.4  --   HGB 13.4   < > 11.4* 10.7* 10.8* 10.9* 11.5*  HCT 41.1   < > 33.9* 33.5* 32.8* 33.6* 34.6*  MCV 93.0  --  93.6 94.9 92.9 93.3 91.5  PLT 206  --  159 143* 185 195 240   < > = values in this interval not displayed.   Basic Metabolic Panel: Recent Labs  Lab 10/29/24 0115 10/30/24 0502 11/01/24 0558 11/02/24 0549 11/03/24 0656  NA 137 136 138 142 142  K 4.1 3.7 4.2 3.8 3.9  CL 106 104 105 106 106  CO2 21* 21* 24 26 27   GLUCOSE 81 76 79 95 82  BUN 23 18 13 11 9   CREATININE 1.53* 1.33* 1.03* 0.91 0.91  CALCIUM  8.2* 8.6* 9.0 9.0 9.3  MG 2.0  --   --   --   --   PHOS 4.2  --   --   --   --    Liver Function Tests: Recent Labs  Lab 10/28/24 1923 10/29/24 0115 10/30/24 0502 11/01/24 0558  AST 45* 32 23 26  ALT 52* 40 29 30  ALKPHOS 133* 105 94 111  BILITOT 1.7* 1.2 0.7 0.6  PROT 7.5 5.9* 5.8* 6.4*  ALBUMIN 3.9 3.1* 2.9* 3.2*   CBG: No results for input(s): GLUCAP in the last 168 hours.  Discharge time spent: greater than 30 minutes.  Signed: Elidia Toribio Furnace, MD [facility] 11/04/2024 "

## 2024-11-04 NOTE — Assessment & Plan Note (Addendum)
 Urinary tract infection present on admission  Patient was placed on IV antibiotic therapy Patient has been afebrile No further antibiotic therapy indicated Urology recommended Myrbetriq , will need pre authorization to continue as outpatient Follow up with Urology as outpatient

## 2024-11-04 NOTE — Assessment & Plan Note (Addendum)
 Back pain controlled with analgesics and muscle relaxants

## 2024-11-04 NOTE — Assessment & Plan Note (Signed)
 Patient was placed on IV fluids with improvement in renal function  At the time of discharge renal function with serum cr at 0,30 with K at 3,9 and serum bicarbonate at 27  Na 142

## 2024-11-04 NOTE — Plan of Care (Signed)
" °  Problem: Health Behavior/Discharge Planning: Goal: Ability to manage health-related needs will improve Outcome: Progressing   Problem: Clinical Measurements: Goal: Ability to maintain clinical measurements within normal limits will improve Outcome: Progressing Goal: Will remain free from infection Outcome: Progressing Goal: Diagnostic test results will improve Outcome: Progressing Goal: Respiratory complications will improve Outcome: Progressing   Problem: Coping: Goal: Level of anxiety will decrease Outcome: Progressing   Problem: Safety: Goal: Ability to remain free from injury will improve Outcome: Progressing   "

## 2024-11-04 NOTE — Assessment & Plan Note (Signed)
Continue with ropinirole

## 2024-11-04 NOTE — Assessment & Plan Note (Signed)
Calculated BMI is 32.2  

## 2024-11-04 NOTE — Hospital Course (Signed)
 Mrs. Norma Colon was admitted to the hospital with the working diagnosis of fever.   64 yo female with the past medical history of rectal cancer, depression, chronic back pain, who presented with right flank pain and chills. Acute onset about 24 hrs prior to admission, because elevated temperature 102.5 at home despite ibuprofen, she decided to come to the ED. Recent uterus ablation and full dental extraction about 5 days prior to admission.  On her initial physical examination her blood pressure was 104/67, HR 67, RR 20 and 02 saturation 98%  Lungs with no wheezing or rhonchi, heart with S1 and S2 present and regular, abdomen with no distention and no lower extremity edema.   Na 137, K 3,4 Cl 102 bicarbonate 21 glucose 87 bun 25 cr 1,63  AST 45 ALT 52 Total bilirubin 1,7  Lactic acid 2,6  Wbc 11.6 hgb 13.4 plt 206  Sars COVID 19 negative Influenza negative RSV negative   Urine analysis SG >1,046, protein > 300, large leukocytes, small hgb, >50 wbc, 0-5 rbc  Urine and blood culture with no growth  CT abdomen and pelvis with no acute abnormality in the abdomen or pelvis. No abscess or obstructive uropathy.  Stable treated hepatic metastasis is segments 2 and 6, no new liver lesions.  Cholelithiasis  CT maxillofacial negative. Sequelae of recent total dental extraction   MRI thoracic spine with no acute or metastatic process identified in the thoracic spine.  Small bilateral pleural effusions.  MRI right hip with moderate osteoarthritis of the right hip with subcortical cyst and edema in the superior acetabulum and small hip  joint effusion.  Tear of the right anterosuperior labrum Partially visualized mild pelvic free fluid.  MRI lumbar spine with no evidence for acute infection within the lumbar spine.  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   EKG 82 bpm, normal axis, right bundle  branch block, qtc 388, sinus rhythm with no significant ST segment or  T wave changes.   CT head with normal findings. No acute intracranial abnormality. No evidence for intracranial metastatic disease on this non contrast examination   US  upper extremities with no deep vein thrombosis Left age indeterminate superficial vein thrombosis involving a branch of the left basilic vein just proximal to the IV placement.  US  Lower extremities negative for deep vein thrombosis

## 2024-11-05 LAB — CREATININE, SERUM
Creatinine, Ser: 1.01 mg/dL — ABNORMAL HIGH (ref 0.44–1.00)
GFR, Estimated: >60 mL/min

## 2024-11-05 NOTE — TOC Transition Note (Signed)
 Transition of Care [facility]) - Discharge Note   Patient Details  Name: Norma Colon MRN: [id number] Date of Birth: Oct 12, 1960  Transition of [facility]) CM/SW Contact:  Sonda Manuella Quill, RN Phone Number: 11/05/2024, 9:26 AM   Clinical Narrative:    Spoke w/ pt; said her car is in ED parking lot, and she plans to drive home; requested Kaitlin, RN via secure chat to contact security to assist; they are not able, alternative tranpsort methods will  be explored; no IP CM needs.   Final next level of care: Home/Self Care Barriers to Discharge: No Barriers Identified   Patient Goals and CMS Choice            Discharge Placement                       Discharge Plan and Services Additional resources added to the After Visit Summary for                  DME Arranged: N/A DME Agency: NA       HH Arranged: NA HH Agency: NA        Social Drivers of Health (SDOH) Interventions SDOH Screenings   Food Insecurity: No Food Insecurity (10/29/2024)  Housing: Low Risk (10/29/2024)  Transportation Needs: No Transportation Needs (10/29/2024)  Utilities: Not At Risk (10/29/2024)  Depression (PHQ2-9): Low Risk (08/10/2024)  Tobacco Use: Medium Risk (10/28/2024)     Readmission Risk Interventions     No data to display

## 2024-11-05 NOTE — Progress Notes (Signed)
 Patient stable for discharge home. Mirabegron  need insurance pre authorization, plan to follow up as outpatient

## 2024-11-05 NOTE — Plan of Care (Signed)
  Problem: Health Behavior/Discharge Planning: Goal: Ability to manage health-related needs will improve Outcome: Progressing   Problem: Clinical Measurements: Goal: Ability to maintain clinical measurements within normal limits will improve Outcome: Progressing Goal: Will remain free from infection Outcome: Progressing Goal: Diagnostic test results will improve Outcome: Progressing   Problem: Activity: Goal: Risk for activity intolerance will decrease Outcome: Progressing   Problem: Nutrition: Goal: Adequate nutrition will be maintained Outcome: Progressing   Problem: Coping: Goal: Level of anxiety will decrease Outcome: Progressing   Problem: Safety: Goal: Ability to remain free from injury will improve Outcome: Progressing

## 2024-11-05 NOTE — Plan of Care (Signed)
  Problem: Health Behavior/Discharge Planning: Goal: Ability to manage health-related needs will improve Outcome: Adequate for Discharge   Problem: Clinical Measurements: Goal: Ability to maintain clinical measurements within normal limits will improve Outcome: Adequate for Discharge Goal: Will remain free from infection Outcome: Adequate for Discharge Goal: Diagnostic test results will improve Outcome: Adequate for Discharge Goal: Respiratory complications will improve Outcome: Adequate for Discharge Goal: Cardiovascular complication will be avoided Outcome: Adequate for Discharge   Problem: Activity: Goal: Risk for activity intolerance will decrease Outcome: Adequate for Discharge   Problem: Nutrition: Goal: Adequate nutrition will be maintained Outcome: Adequate for Discharge   Problem: Coping: Goal: Level of anxiety will decrease Outcome: Adequate for Discharge   Problem: Elimination: Goal: Will not experience complications related to bowel motility Outcome: Adequate for Discharge Goal: Will not experience complications related to urinary retention Outcome: Adequate for Discharge   Problem: Pain Managment: Goal: General experience of comfort will improve and/or be controlled Outcome: Adequate for Discharge   Problem: Safety: Goal: Ability to remain free from injury will improve Outcome: Adequate for Discharge   Problem: Skin Integrity: Goal: Risk for impaired skin integrity will decrease Outcome: Adequate for Discharge

## 2024-11-08 ENCOUNTER — Emergency Department (HOSPITAL_COMMUNITY)

## 2024-11-08 ENCOUNTER — Inpatient Hospital Stay (HOSPITAL_COMMUNITY)
Admission: EM | Admit: 2024-11-08 | Source: Home / Self Care | Attending: Internal Medicine | Admitting: Internal Medicine

## 2024-11-08 ENCOUNTER — Other Ambulatory Visit: Payer: Self-pay

## 2024-11-08 ENCOUNTER — Telehealth: Payer: Self-pay | Admitting: *Deleted

## 2024-11-08 DIAGNOSIS — N179 Acute kidney failure, unspecified: Secondary | ICD-10-CM

## 2024-11-08 DIAGNOSIS — N390 Urinary tract infection, site not specified: Secondary | ICD-10-CM | POA: Diagnosis present

## 2024-11-08 DIAGNOSIS — M545 Low back pain, unspecified: Secondary | ICD-10-CM | POA: Diagnosis present

## 2024-11-08 DIAGNOSIS — R6520 Severe sepsis without septic shock: Secondary | ICD-10-CM

## 2024-11-08 DIAGNOSIS — R319 Hematuria, unspecified: Secondary | ICD-10-CM | POA: Diagnosis present

## 2024-11-08 DIAGNOSIS — N3090 Cystitis, unspecified without hematuria: Principal | ICD-10-CM

## 2024-11-08 DIAGNOSIS — A419 Sepsis, unspecified organism: Secondary | ICD-10-CM | POA: Diagnosis present

## 2024-11-08 DIAGNOSIS — R509 Fever, unspecified: Secondary | ICD-10-CM | POA: Diagnosis present

## 2024-11-08 LAB — URINALYSIS, W/ REFLEX TO CULTURE (INFECTION SUSPECTED)
Bilirubin Urine: NEGATIVE
Glucose, UA: NEGATIVE mg/dL
Ketones, ur: NEGATIVE mg/dL
Nitrite: NEGATIVE
Protein, ur: 100 mg/dL — AB
Specific Gravity, Urine: 1.018 (ref 1.005–1.030)
WBC, UA: >50 WBC/hpf (ref 0–5)
pH: 5 (ref 5.0–8.0)

## 2024-11-08 LAB — PROTIME-INR
INR: 1.7 — ABNORMAL HIGH (ref 0.8–1.2)
Prothrombin Time: 21.1 s — ABNORMAL HIGH (ref 11.4–15.2)

## 2024-11-08 LAB — COMPREHENSIVE METABOLIC PANEL WITH GFR
ALT: 43 U/L (ref 0–44)
AST: 29 U/L (ref 15–41)
Albumin: 3.7 g/dL (ref 3.5–5.0)
Alkaline Phosphatase: 109 U/L (ref 38–126)
Anion gap: 12 (ref 5–15)
BUN: 21 mg/dL (ref 8–23)
CO2: 24 mmol/L (ref 22–32)
Calcium: 9.5 mg/dL (ref 8.9–10.3)
Chloride: 101 mmol/L (ref 98–111)
Creatinine, Ser: 1.5 mg/dL — ABNORMAL HIGH (ref 0.44–1.00)
GFR, Estimated: 39 mL/min — ABNORMAL LOW
Glucose, Bld: 124 mg/dL — ABNORMAL HIGH (ref 70–99)
Potassium: 3.7 mmol/L (ref 3.5–5.1)
Sodium: 137 mmol/L (ref 135–145)
Total Bilirubin: 1.7 mg/dL — ABNORMAL HIGH (ref 0.0–1.2)
Total Protein: 7.8 g/dL (ref 6.5–8.1)

## 2024-11-08 LAB — CBC WITH DIFFERENTIAL/PLATELET
Abs Immature Granulocytes: 0.08 10*3/uL — ABNORMAL HIGH (ref 0.00–0.07)
Basophils Absolute: 0 10*3/uL (ref 0.0–0.1)
Basophils Relative: 0 %
Eosinophils Absolute: 0 10*3/uL (ref 0.0–0.5)
Eosinophils Relative: 0 %
HCT: 37.1 % (ref 36.0–46.0)
Hemoglobin: 11.9 g/dL — ABNORMAL LOW (ref 12.0–15.0)
Immature Granulocytes: 1 %
Lymphocytes Relative: 6 %
Lymphs Abs: 1 10*3/uL (ref 0.7–4.0)
MCH: 30.5 pg (ref 26.0–34.0)
MCHC: 32.1 g/dL (ref 30.0–36.0)
MCV: 95.1 fL (ref 80.0–100.0)
Monocytes Absolute: 0.8 10*3/uL (ref 0.1–1.0)
Monocytes Relative: 5 %
Neutro Abs: 15.1 10*3/uL — ABNORMAL HIGH (ref 1.7–7.7)
Neutrophils Relative %: 88 %
Platelets: 290 10*3/uL (ref 150–400)
RBC: 3.9 MIL/uL (ref 3.87–5.11)
RDW: 13.8 % (ref 11.5–15.5)
WBC: 17 10*3/uL — ABNORMAL HIGH (ref 4.0–10.5)
nRBC: 0 % (ref 0.0–0.2)

## 2024-11-08 LAB — RESP PANEL BY RT-PCR (RSV, FLU A&B, COVID)  RVPGX2
Influenza A by PCR: NEGATIVE
Influenza B by PCR: NEGATIVE
Resp Syncytial Virus by PCR: NEGATIVE
SARS Coronavirus 2 by RT PCR: NEGATIVE

## 2024-11-08 LAB — I-STAT CG4 LACTIC ACID, ED: Lactic Acid, Venous: 0.8 mmol/L (ref 0.5–1.9)

## 2024-11-08 MED ORDER — ONDANSETRON HCL 4 MG/2ML IJ SOLN
4.0000 mg | Freq: Four times a day (QID) | INTRAMUSCULAR | Status: AC | PRN
  Administered 2024-11-09: 4 mg via INTRAVENOUS
  Filled 2024-11-08: qty 2

## 2024-11-08 MED ORDER — LACTATED RINGERS IV BOLUS
1000.0000 mL | Freq: Once | INTRAVENOUS | Status: AC
  Administered 2024-11-08: 1000 mL via INTRAVENOUS

## 2024-11-08 MED ORDER — ACETAMINOPHEN 500 MG PO TABS
1000.0000 mg | ORAL_TABLET | Freq: Once | ORAL | Status: AC
  Administered 2024-11-08: 1000 mg via ORAL
  Filled 2024-11-08: qty 2

## 2024-11-08 MED ORDER — SODIUM CHLORIDE 0.9% FLUSH
3.0000 mL | Freq: Two times a day (BID) | INTRAVENOUS | Status: AC
  Administered 2024-11-09 – 2024-11-10 (×4): 3 mL via INTRAVENOUS

## 2024-11-08 MED ORDER — ALPRAZOLAM 0.5 MG PO TABS
2.0000 mg | ORAL_TABLET | Freq: Every morning | ORAL | Status: AC
  Administered 2024-11-09 – 2024-11-10 (×2): 2 mg via ORAL
  Filled 2024-11-08 (×2): qty 4

## 2024-11-08 MED ORDER — ROPINIROLE HCL 0.25 MG PO TABS
0.2500 mg | ORAL_TABLET | Freq: Every day | ORAL | Status: AC
  Administered 2024-11-08 – 2024-11-10 (×3): 0.25 mg via ORAL
  Filled 2024-11-08 (×3): qty 1

## 2024-11-08 MED ORDER — BUPROPION HCL ER (XL) 300 MG PO TB24
300.0000 mg | ORAL_TABLET | Freq: Every morning | ORAL | Status: AC
  Administered 2024-11-09 – 2024-11-10 (×2): 300 mg via ORAL
  Filled 2024-11-08 (×2): qty 1

## 2024-11-08 MED ORDER — BISACODYL 5 MG PO TBEC: 5.0000 mg | DELAYED_RELEASE_TABLET | Freq: Every day | ORAL | Status: AC | PRN

## 2024-11-08 MED ORDER — FENTANYL CITRATE (PF) 50 MCG/ML IJ SOSY
50.0000 ug | PREFILLED_SYRINGE | Freq: Once | INTRAMUSCULAR | Status: AC
  Administered 2024-11-08: 50 ug via INTRAVENOUS
  Filled 2024-11-08: qty 1

## 2024-11-08 MED ORDER — ONDANSETRON HCL 4 MG PO TABS: 4.0000 mg | ORAL_TABLET | Freq: Four times a day (QID) | ORAL | Status: AC | PRN

## 2024-11-08 MED ORDER — TRAMADOL HCL 50 MG PO TABS
50.0000 mg | ORAL_TABLET | Freq: Four times a day (QID) | ORAL | Status: AC | PRN
  Administered 2024-11-09: 50 mg via ORAL
  Filled 2024-11-08: qty 1

## 2024-11-08 MED ORDER — ACETAMINOPHEN 325 MG PO TABS: 650.0000 mg | ORAL_TABLET | Freq: Four times a day (QID) | ORAL | Status: AC | PRN

## 2024-11-08 MED ORDER — SODIUM CHLORIDE 0.9 % IV SOLN
1.0000 g | INTRAVENOUS | Status: DC
  Administered 2024-11-08 – 2024-11-09 (×2): 1 g via INTRAVENOUS
  Filled 2024-11-08 (×2): qty 10

## 2024-11-08 MED ORDER — ENOXAPARIN SODIUM 40 MG/0.4ML IJ SOSY
40.0000 mg | PREFILLED_SYRINGE | INTRAMUSCULAR | Status: AC
  Administered 2024-11-09 – 2024-11-10 (×2): 40 mg via SUBCUTANEOUS
  Filled 2024-11-08 (×2): qty 0.4

## 2024-11-08 MED ORDER — ACETAMINOPHEN 650 MG RE SUPP: 650.0000 mg | Freq: Four times a day (QID) | RECTAL | Status: AC | PRN

## 2024-11-08 MED ORDER — ALBUTEROL SULFATE (2.5 MG/3ML) 0.083% IN NEBU: 2.5000 mg | INHALATION_SOLUTION | RESPIRATORY_TRACT | Status: AC | PRN

## 2024-11-08 NOTE — ED Provider Notes (Signed)
 " [place] [facility] DEPARTMENT AT [facility] Provider Note   CSN: [id number] Arrival date & time: 11/08/24  1245     Patient presents with: Fever, Chills, and Hematuria   Norma Colon is a 64 y.o. female.   64 year old female presents with right-sided flank pain similar to her prior UTIs.  Recent mission for sepsis of unclear etiology.  States has had temperatures at home tomorrow 2.5.  No real cough or congestion.  Has had some hematuria.  No abdominal discomfort.  Responsive to home medications       Prior to Admission medications  Medication Sig Start Date End Date Taking? Authorizing Provider  acetaminophen -codeine (TYLENOL  #3) 300-30 MG tablet Take 1 tablet by mouth every 4 (four) hours as needed. for pain    [provider]  alprazolam  (XANAX ) 2 MG tablet Take 2 mg by mouth every morning.     [provider]  buPROPion  (WELLBUTRIN  XL) 300 MG 24 hr tablet Take 300 mg by mouth every morning.     [provider]  chlorhexidine  (PERIDEX ) 0.12 % solution Use as directed 15 mLs in the mouth or throat in the morning, at noon, and at bedtime. 10/22/24   [provider]  cyclobenzaprine  (FLEXERIL ) 10 MG tablet Take 20 mg by mouth at bedtime.    [provider]  ibuprofen (ADVIL) 400 MG tablet TAKE 1 TABLET BY MOUTH FOUR TIMES DAILY WITH FOOD    [provider]  rOPINIRole  (REQUIP ) 0.25 MG tablet Take 0.25 mg by mouth at bedtime. 10/24/24   [provider]  traMADol  (ULTRAM ) 50 MG tablet Take 50 mg by mouth every 6 (six) hours as needed. 12/03/22   [provider]    Allergies: Prednisone and Codeine    Review of Systems  All other systems reviewed and are negative.   Updated Vital Signs BP 134/74 (BP Location: Left Arm)   Pulse (!) 118   Temp 99.2 F (37.3 C) (Oral)   Resp 20   LMP  (LMP Unknown)   SpO2 98%   Physical Exam Vitals and nursing note reviewed.  Constitutional:       General: She is not in acute distress.    Appearance: Normal appearance. She is well-developed. She is not toxic-appearing.  HENT:     Head: Normocephalic and atraumatic.  Eyes:     General: Lids are normal.     Conjunctiva/sclera: Conjunctivae normal.     Pupils: Pupils are equal, round, and reactive to light.  Neck:     Thyroid : No thyroid  mass.     Trachea: No tracheal deviation.  Cardiovascular:     Rate and Rhythm: Normal rate and regular rhythm.     Heart sounds: Normal heart sounds. No murmur heard.    No gallop.  Pulmonary:     Effort: Pulmonary effort is normal. No respiratory distress.     Breath sounds: Normal breath sounds. No stridor. No decreased breath sounds, wheezing, rhonchi or rales.  Abdominal:     General: There is no distension.     Palpations: Abdomen is soft.     Tenderness: There is no abdominal tenderness. There is right CVA tenderness. There is no rebound.  Musculoskeletal:        General: No tenderness. Normal range of motion.     Cervical back: Normal range of motion and neck supple.  Skin:    General: Skin is warm and dry.     Findings: No abrasion or  rash.  Neurological:     Mental Status: She is alert and oriented to person, place, and time. Mental status is at baseline.     GCS: GCS eye subscore is 4. GCS verbal subscore is 5. GCS motor subscore is 6.     Cranial Nerves: No cranial nerve deficit.     Sensory: No sensory deficit.     Motor: Motor function is intact.  Psychiatric:        Attention and Perception: Attention normal.        Speech: Speech normal.        Behavior: Behavior normal.     (all labs ordered are listed, but only abnormal results are displayed) Labs Reviewed  CULTURE, BLOOD (ROUTINE X 2)  CULTURE, BLOOD (ROUTINE X 2)  RESP PANEL BY RT-PCR (RSV, FLU A&B, COVID)  [id number]  COMPREHENSIVE METABOLIC PANEL WITH GFR  CBC WITH DIFFERENTIAL/PLATELET  PROTIME-INR  URINALYSIS, W/ REFLEX TO CULTURE (INFECTION SUSPECTED)  I-STAT  CG4 LACTIC ACID, ED    EKG: None  Radiology: No results found.   Procedures   Medications Ordered in the ED - No data to display  Clinical Course as of 11/08/24 1515  Wed Nov 08, 2024  1511 Likely pyelo, urinary sx, fever, negative renal ct, needs ua, sepsis admit [LS]    Clinical Course User Index [LS] Rogelia Jerilynn RAMAN, MD                                 Medical Decision Making Amount and/or Complexity of Data Reviewed Radiology: ordered.   Patient had negative renal CT for stone.  Urinalysis is pending at this time.  Clinically she has UTI and started on Rocephin .  Patient will require admission likely.  Signed out to next provider     Final diagnoses:  None    ED Discharge Orders     None          Dasie Faden, MD 11/08/24 1515  "

## 2024-11-08 NOTE — H&P (Signed)
 " History and Physical    Patient: Norma Colon FMW:[id number] DOB: 1960-12-21 DOA: 11/08/2024 DOS: the patient was seen and examined on 11/08/2024 PCP: Mat Browning, MD  Patient coming from: Home  Chief Complaint:  Chief Complaint  Patient presents with   Fever   Chills   Hematuria   HPI: Norma Colon is a 64 y.o. 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  She was hospitalized from January 25 through January 31 for fever of unknown origin and acute kidney injury.  She was treated for possible UTI but her urine culture and blood cultures both were negative.  Infectious disease was consulted.  Her workup included an MRI of the T-spine, L-spine, and right hip.  She had musculoskeletal findings including a torn labrum and a small hip joint effusion but no evidence of infection was found on the MRI.  She did have a dental extraction and uterus ablation the week prior to admission so a CT of the maxilfoacial region and of the abdomen pelvis were also done.  No clear source of infection was found.  She was treated with cefepime  IV for 4 days for possible UTI then monitored off antibiotics for 2 days. The patient was feeling better by the time of discharge on a Friday.  She did well x 4 days, then yesterday, on Tuesday she developed fever and severe shaking chills.  She recognized the symptoms but was not eager to come back to the hospital.  She then developed blood in her urine as well as some right flank pain that is when she decided to come in for evaluation.  Last week when she was hospitalized she did not have blood in her urine.  This is a new finding.  In the emergency department the patient was found to have an elevated white blood cell count of 17 which is much higher than her white blood cell count at discharge.  Her fever was better here  in the ER at 100.4.  Workup included negative COVID, flu, and RSV tests.  Her UA revealed bloody urine with bacteria.  CT renal stone protocol but no kidney stone was found.  There were no acute findings.  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  She has cholelithiasis lithiasis with no evidence of cholecystitis.  She did meet sepsis criteria on admission with the elevated white blood cell count and fever and she was also little tachypneic on arrival.  IV Rocephin  was ordered.  The patient will be admitted to the hospital service for further management and workup.   Review of Systems: As mentioned in the history of present illness. All other systems reviewed and are negative. 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[facility]) oncologist-- dr cloretta--  per last note in epic ,  clinical remission   dx 08/ 2019---- chemoradiation concurrent completed 07-20-2018;  s/p  low anterior resection 09-16-2018;   chemo 10-17-2018  ot 12-19-2018   Anxiety    Arthritis    Chemotherapy induced neutropenia    Depression    Headache    migraines    History of HPV infection    History of kidney stones    Hydronephrosis, right    Restless leg syndrome    Scoliosis    Sepsis (HCC)    11-2018   Past Surgical  History:  Procedure Laterality Date   COLONOSCOPY     COLPOSCOPY  04/04/2018   CYSTOSCOPY W/ RETROGRADES Right 07/31/2021   Procedure: CYSTOSCOPY WITH RETROGRADE PYELOGRAM, URETEROSCOPY AND STENT PLACEMENT;  Surgeon: Selma Donnice SAUNDERS, MD;  Location: [facility] OR;  Service: Urology;  Laterality: Right;   CYSTOSCOPY W/ URETERAL STENT PLACEMENT Right 09/04/2021   Procedure: CYSTOSCOPY WITH RETROGRADE PYELOGRAM WITH STENT REMOVAL;  Surgeon: Sherrilee Belvie CROME, MD;  Location: [facility];  Service: Urology;  Laterality: Right;   CYSTOSCOPY WITH RETROGRADE PYELOGRAM, URETEROSCOPY AND STENT PLACEMENT Right 12/12/2018   Procedure: CYSTOSCOPY WITH RETROGRADE PYELOGRAM, URETEROSCOPY, STONE  EXTRACTION AND STENT PLACEMENT;  Surgeon: Sherrilee Belvie CROME, MD;  Location: [facility];  Service: Urology;  Laterality: Right;   CYSTOSCOPY WITH RETROGRADE PYELOGRAM, URETEROSCOPY AND STENT PLACEMENT Right 04/17/2019   Procedure: CYSTOSCOPY WITH RETROGRADE PYELOGRAM, BALLOON DILITATION,DIAGNOSTIC  URETEROSCOPY AND STENT PLACEMENT;  Surgeon: Sherrilee Belvie CROME, MD;  Location: [facility];  Service: Urology;  Laterality: Right;   CYSTOSCOPY WITH STENT PLACEMENT Right 11/21/2018   Procedure: CYSTOSCOPY, RIGHT RETROGRADE PYELOGRAM, WITH RIGHT URETERAL STENT PLACEMENT;  Surgeon: Sherrilee Belvie CROME, MD;  Location: [facility] OR[facility];  Service: Urology;  Laterality: Right;   IR IMAGING GUIDED PORT INSERTION  09/04/2020   IR NEPHROSTOMY PLACEMENT RIGHT  06/22/2019   IR PATIENT EVAL TECH 0-60 MINS  06/28/2019   IR RADIOLOGIST EVAL & MGMT  06/06/2020   IR RADIOLOGIST EVAL & MGMT  07/17/2020   IR RADIOLOGIST EVAL & MGMT  08/15/2020   IR RADIOLOGIST EVAL & MGMT  09/03/2020   IR RADIOLOGIST EVAL & MGMT  12/18/2020   IR RADIOLOGIST EVAL & MGMT  02/25/2021   IR RADIOLOGIST EVAL & MGMT  06/23/2021   IR RADIOLOGIST EVAL & MGMT  08/12/2021   IR RADIOLOGIST EVAL & MGMT  10/15/2021   IR RADIOLOGIST EVAL & MGMT  01/13/2022   IR RADIOLOGIST EVAL & MGMT  04/14/2022   IR RADIOLOGIST EVAL & MGMT  07/23/2022   IR RADIOLOGIST EVAL & MGMT  02/09/2023   IR RADIOLOGIST EVAL & MGMT  10/21/2023   IR RADIOLOGIST EVAL & MGMT  12/16/2023   IR RADIOLOGIST EVAL & MGMT  01/27/2024   IR REMOVAL TUN ACCESS W/ PORT W/O FL MOD SED  01/29/2022   left forearm surgery due to calcium  rock      RADIOLOGY WITH ANESTHESIA N/A 06/19/2020   Procedure: CT WITH ANESTHESIA MICROWAVE ABLATION;  Surgeon: Karalee Beat, MD;  Location: [facility];  Service: Anesthesiology;  Laterality: N/A;   RADIOLOGY WITH ANESTHESIA N/A 07/30/2021   Procedure: CT MICROWAVE ABLATION OF THE LIVER;  Surgeon: Karalee Beat POUR, MD;  Location: [facility];  Service: Radiology;  Laterality: N/A;   ROBOT ASSISTED PYELOPLASTY Right 08/03/2019   Procedure: XI ROBOTIC ASSISTED PYELOPLASTY WITH STENT PLACEMENT;  Surgeon: Sherrilee Belvie CROME, MD;  Location: [facility];  Service: Urology;  Laterality: Right;  3 HRS   SHOULDER ARTHROSCOPY W/ ROTATOR CUFF REPAIR Right    XI ROBOTIC ASSISTED LOWER ANTERIOR RESECTION N/A 09/16/2018   Procedure: XI ROBOTIC ASSISTED LOWER ANTERIOR RESECTION ERAS PATHWAY;  Surgeon: Debby Hila, MD;  Location: [facility];  Service: General;  Laterality: N/A;   Social History:  reports that she quit smoking about 6 years ago. Her smoking use included cigarettes. She started smoking about 36 years ago. She has a 30 pack-year smoking history. She has never used smokeless tobacco. She reports current alcohol use. She reports that she does not  use drugs.  Allergies[1]  Family History  Problem Relation Age of Onset   COPD Father        smoker   Stomach cancer Father        spread to lungs and bones   Arthritis Mother    Heart attack Paternal Grandmother    Heart attack Paternal Grandfather    Heart attack Maternal Grandmother    Stroke Maternal Grandmother    Colon cancer Neg Hx    Rectal cancer Neg Hx    Esophageal cancer Neg Hx     Prior to Admission medications  Medication Sig Start Date End Date Taking? Authorizing Provider  acetaminophen -codeine (TYLENOL  #3) 300-30 MG tablet Take 1 tablet by mouth every 4 (four) hours as needed. for pain    [provider]  alprazolam  (XANAX ) 2 MG tablet Take 2 mg by mouth every morning.     [provider]  buPROPion  (WELLBUTRIN  XL) 300 MG 24 hr tablet Take 300 mg by mouth every morning.     [provider]  chlorhexidine  (PERIDEX ) 0.12 % solution Use as directed 15 mLs in the mouth or throat in the morning, at noon, and at bedtime. 10/22/24   [provider]  cyclobenzaprine  (FLEXERIL ) 10 MG tablet Take 20 mg by mouth at bedtime.    [provider]  ibuprofen (ADVIL) 400 MG tablet TAKE 1 TABLET BY MOUTH FOUR TIMES DAILY WITH FOOD    [provider]  rOPINIRole  (REQUIP ) 0.25 MG tablet Take 0.25 mg by mouth at bedtime. 10/24/24   [provider]  traMADol  (ULTRAM ) 50 MG tablet Take 50 mg by mouth every 6 (six) hours as needed. 12/03/22   [provider]    Physical Exam: Vitals:   11/08/24 1251 11/08/24 1604  BP: 134/74 133/72  Pulse: (!) 118 (!) 106  Resp: 20 19  Temp: 99.2 F (37.3 C) (!) 100.6 F (38.1 C)  TempSrc: Oral Oral  SpO2: 98% 100%   Physical Exam:  General: No acute distress, well developed, well nourished HEENT: Normocephalic, atraumatic, PERRL Cardiovascular: Normal rate and rhythm. Distal pulses intact. Pulmonary: Normal pulmonary effort, normal breath sounds Gastrointestinal: Nondistended abdomen, soft, Very tender in RUQ and positive CVA tenderness on right, normoactive bowel sounds Musculoskeletal:Normal ROM, no lower ext edema Lymphadenopathy: No cervical LAD. Skin: Skin is warm and dry. Neuro: No focal deficits noted, AAOx3. PSYCH: Attentive and cooperative  Data Reviewed:  Results for orders placed or performed during the hospital encounter of 11/08/24 (from the past 24 hours)  Comprehensive metabolic panel     Status: Abnormal   Collection Time: 11/08/24  1:30 PM  Result Value Ref Range   Sodium 137 135 - 145 mmol/L   Potassium 3.7 3.5 - 5.1 mmol/L   Chloride 101 98 - 111 mmol/L   CO2 24 22 - 32 mmol/L   Glucose, Bld 124 (H) 70 - 99 mg/dL   BUN 21 8 - 23 mg/dL   Creatinine, Ser 8.49 (H) 0.44 - 1.00 mg/dL   Calcium  9.5 8.9 - 10.3 mg/dL   Total Protein 7.8 6.5 - 8.1 g/dL   Albumin 3.7 3.5 - 5.0 g/dL   AST 29 15 - 41 U/L   ALT 43 0 - 44 U/L   Alkaline Phosphatase 109 38 - 126 U/L   Total Bilirubin 1.7 (H) 0.0 - 1.2 mg/dL   GFR, Estimated 39 (L) >60 mL/min   Anion gap 12 5 - 15  CBC with Differential  Status: Abnormal   Collection Time: 11/08/24   1:30 PM  Result Value Ref Range   WBC 17.0 (H) 4.0 - 10.5 K/uL   RBC 3.90 3.87 - 5.11 MIL/uL   Hemoglobin 11.9 (L) 12.0 - 15.0 g/dL   HCT 62.8 63.9 - 53.9 %   MCV 95.1 80.0 - 100.0 fL   MCH 30.5 26.0 - 34.0 pg   MCHC 32.1 30.0 - 36.0 g/dL   RDW 86.1 88.4 - 84.4 %   Platelets 290 150 - 400 K/uL   nRBC 0.0 0.0 - 0.2 %   Neutrophils Relative % 88 %   Neutro Abs 15.1 (H) 1.7 - 7.7 K/uL   Lymphocytes Relative 6 %   Lymphs Abs 1.0 0.7 - 4.0 K/uL   Monocytes Relative 5 %   Monocytes Absolute 0.8 0.1 - 1.0 K/uL   Eosinophils Relative 0 %   Eosinophils Absolute 0.0 0.0 - 0.5 K/uL   Basophils Relative 0 %   Basophils Absolute 0.0 0.0 - 0.1 K/uL   Immature Granulocytes 1 %   Abs Immature Granulocytes 0.08 (H) 0.00 - 0.07 K/uL  Resp panel by RT-PCR (RSV, Flu A&B, Covid)     Status: None   Collection Time: 11/08/24  1:30 PM   Specimen: Nasal Swab  Result Value Ref Range   SARS Coronavirus 2 by RT PCR NEGATIVE NEGATIVE   Influenza A by PCR NEGATIVE NEGATIVE   Influenza B by PCR NEGATIVE NEGATIVE   Resp Syncytial Virus by PCR NEGATIVE NEGATIVE  Protime-INR     Status: Abnormal   Collection Time: 11/08/24  2:25 PM  Result Value Ref Range   Prothrombin Time 21.1 (H) 11.4 - 15.2 seconds   INR 1.7 (H) 0.8 - 1.2  I-Stat Lactic Acid, ED     Status: None   Collection Time: 11/08/24  2:32 PM  Result Value Ref Range   Lactic Acid, Venous 0.8 0.5 - 1.9 mmol/L  Urinalysis, w/ Reflex to Culture (Infection Suspected) -     Status: Abnormal   Collection Time: 11/08/24  3:09 PM  Result Value Ref Range   Specimen Source URINE, CLEAN CATCH    Color, Urine AMBER (A) YELLOW   APPearance CLOUDY (A) CLEAR   Specific Gravity, Urine 1.018 1.005 - 1.030   pH 5.0 5.0 - 8.0   Glucose, UA NEGATIVE NEGATIVE mg/dL   Hgb urine dipstick MODERATE (A) NEGATIVE   Bilirubin Urine NEGATIVE NEGATIVE   Ketones, ur NEGATIVE NEGATIVE mg/dL   Protein, ur 899 (A) NEGATIVE mg/dL   Nitrite NEGATIVE NEGATIVE    Leukocytes,Ua LARGE (A) NEGATIVE   RBC / HPF 21-50 0 - 5 RBC/hpf   WBC, UA >50 0 - 5 WBC/hpf   Bacteria, UA MANY (A) NONE SEEN   Squamous Epithelial / HPF 0-5 0 - 5 /HPF   WBC Clumps PRESENT    Mucus PRESENT    Hyaline Casts, UA PRESENT    Non Squamous Epithelial 0-5 (A) NONE SEEN   CT abdomen pelvis IMPRESSION: 1. No acute findings to explain the patient's pain. 2. Hepatic steatosis with treated hepatic metastatic disease. 3. Trace right pleural fluid. 4. Cholelithiasis. 5. Grade 3 anterolisthesis of L5 on S1 with advanced secondary degenerative disc disease.  Assessment and Plan: Sepsis -Tmax was 100.6, white blood cell count is 17.  Lactic acid was normal at 0.8, she was a little tachypneic and tachycardic on arrival.  - IV Rocephin  has been started.  Await urine culture Right flank pain and  bloody urine certainly does sound like a kidney stone but CT PE was negative. - Will reconsult ID.  Her previous workup was thorough. - She had 1 episode of hypotension in the emergency department and will receive extra fluid bolus for that. She has cholelithiasis and her pain is in the right side including right upper quadrant.  Will go ahead and check HIDA scan.  2. AKI - Monitor creatinine after IV fluids  3.  Restless leg syndrome - Continue ropinirole .    Advance Care Planning:   Code Status: Prior  The patient would like to be full code.  She names Marval is her runner, broadcasting/film/video.  Consults: ID  Family Communication: None  Severity of Illness: The appropriate patient status for this patient is INPATIENT. Inpatient status is judged to be reasonable and necessary in order to provide the required intensity of service to ensure the patient's safety. The patient's presenting symptoms, physical exam findings, and initial radiographic and laboratory data in the context of their chronic comorbidities is felt to place them at high risk for further clinical deterioration.  Furthermore, it is not anticipated that the patient will be medically stable for discharge from the hospital within 2 midnights of admission.   * I certify that at the point of admission it is my clinical judgment that the patient will require inpatient hospital care spanning beyond 2 midnights from the point of admission due to high intensity of service, high risk for further deterioration and high frequency of surveillance required.*  Author: ARTHEA CHILD, MD 11/08/2024 5:18 PM  For on call review [email or web address].      [1]  Allergies Allergen Reactions   Prednisone Other (See Comments)    Felt like she was crazy if on it more than 1 week   Codeine Other (See Comments)    Causes migraine--patient requests this be kept on allergy list   "

## 2024-11-08 NOTE — Telephone Encounter (Signed)
 Received notification from [facility] that patient called last night with 102 fever, chills, dysuria and hematuria. Also having diffuse muscle aches. Was told to go emergency room.  Called her back and left VM that she needs go to PCP or urgent care to get worked up.

## 2024-11-08 NOTE — ED Notes (Signed)
 Notified MD Stanek of BP 88/58

## 2024-11-08 NOTE — ED Provider Notes (Signed)
" °  [place] EMERGENCY DEPARTMENT AT [facility] Provider Assume Care Note I assumed care of Norma Colon on 11/08/2024 at 3 PM from Dr. Dasie.   Briefly, Norma Colon is a 64 y.o. female who: PMHx: Metastatic rectal adenocarcinoma, pancytopenia, recent sepsis P/w fevers, chills, right flank pain Recent admission for sepsis of unclear origin, discharged on 1/24, however has had persistent symptoms, presented initially tachycardic CT abdomen pelvis without evidence of stone, initial urine that sent to lab, and reportedly did not have en route of a specimen, thus had to be resent, getting ceftriaxone , likely will need admission  Plan at the time of handoff: Follow-up UA, vitals, likely admit   Please refer to the original providers note for additional information regarding the care of Olam Tommas Lesches.  Reassessment: I personally reassessed the patient: Patient without any acute complaints or additional questions.  Vital Signs:  ED Triage Vitals  Encounter Vitals Group     BP 11/08/24 1251 134/74     Girls Systolic BP Percentile --      Girls Diastolic BP Percentile --      Boys Systolic BP Percentile --      Boys Diastolic BP Percentile --      Pulse Rate 11/08/24 1251 (!) 118     Resp 11/08/24 1251 20     Temp 11/08/24 1251 99.2 F (37.3 C)     Temp Source 11/08/24 1251 Oral     SpO2 11/08/24 1251 98 %     Weight --      Height --      Head Circumference --      Peak Flow --      Pain Score 11/08/24 1255 8     Pain Loc --      Pain Education --      Exclude from Growth Chart --      Hemodynamics:  The patient is hemodynamically stable. Mental Status:  The patient is alert  Additional MDM: Repeat UA with large LE, numerous bacteria, WBCs, RBCs, which is concerning for UTI.  On reevaluation, patient continues to report pain.  Patient is tachycardic with rate around 100, was initially 118, and patient was near febrile on arrival at 99.2, do  feel that fever is likely driving at least part of this tachycardia, will administer Tylenol , LR bolus, as well as morphine  for pain control in addition to ceftriaxone  that Dr. Dasie ordered.  Given ongoing symptoms, vital sign derangements with significant new leukocytosis do feel that patient would benefit from admission for IV antibiotics.  Medicine consulted for admission, spoke with Dr. Arthea who accepted the patient to her service. Nursing note states Notified MD Shontell Prosser of BP 88/58 this note timed at 1809. I was in a prolonged patient code from ~1700-1915, thus did not see this secure chat, and I was not personally notified of this hemodynamic change. By the time I was available to see the secure chat, patient had improvement in pressure to 100s/60s. Will order additional fluid bolus, still awaiting inpatient team's placement of orders.   Disposition: ADMIT: I believe the patient requires admission for further care and management. The patient was admitted to hospitalists. Please see inpatient provider note for additional treatment plan details.    FREDRIK CANDIE Later, MD Emergency Medicine    Later Jerilynn RAMAN, MD 11/08/24 2241  "

## 2024-11-08 NOTE — ED Provider Triage Note (Signed)
 Emergency Medicine Provider Triage Evaluation Note  Norma Colon , a 64 y.o. female  was evaluated in triage.  Pt complains of fever 102.5 with blood in urine and pain right kidney.  Dc from hospital 11/04/24 admitted for fever, flank pain. Urine and blood cultures negative.   Review of Systems  Positive: Right flank pain, fever Negative: vomiting  Physical Exam  LMP  (LMP Unknown)  Gen:   Awake, no distress   Resp:  Normal effort  MSK:   Moves extremities without difficulty  Other:    Medical Decision Making  Medically screening exam initiated at 12:50 PM.  Appropriate orders placed.  Norma Colon was informed that the remainder of the evaluation will be completed by another provider, this initial triage assessment does not replace that evaluation, and the importance of remaining in the ED until their evaluation is complete.     Beverley Leita LABOR, PA-C 11/08/24 1252

## 2024-11-08 NOTE — ED Triage Notes (Addendum)
 Patient discharged from here recently for sepsis 10/28/24, c/o continued fever/chills/right flank pain. Patient is alert and oriented x 4. Airway patent, respirations even and unlabored. Skin normal, warm and dry.

## 2024-11-09 ENCOUNTER — Inpatient Hospital Stay (HOSPITAL_COMMUNITY)

## 2024-11-09 ENCOUNTER — Telehealth: Payer: Self-pay | Admitting: *Deleted

## 2024-11-09 DIAGNOSIS — Z85048 Personal history of other malignant neoplasm of rectum, rectosigmoid junction, and anus: Secondary | ICD-10-CM

## 2024-11-09 DIAGNOSIS — R319 Hematuria, unspecified: Secondary | ICD-10-CM | POA: Diagnosis present

## 2024-11-09 DIAGNOSIS — M545 Low back pain, unspecified: Secondary | ICD-10-CM | POA: Diagnosis present

## 2024-11-09 DIAGNOSIS — Z8505 Personal history of malignant neoplasm of liver: Secondary | ICD-10-CM

## 2024-11-09 DIAGNOSIS — R509 Fever, unspecified: Secondary | ICD-10-CM

## 2024-11-09 LAB — CBC
HCT: 30.6 % — ABNORMAL LOW (ref 36.0–46.0)
Hemoglobin: 10 g/dL — ABNORMAL LOW (ref 12.0–15.0)
MCH: 30.8 pg (ref 26.0–34.0)
MCHC: 32.7 g/dL (ref 30.0–36.0)
MCV: 94.2 fL (ref 80.0–100.0)
Platelets: 257 10*3/uL (ref 150–400)
RBC: 3.25 MIL/uL — ABNORMAL LOW (ref 3.87–5.11)
RDW: 13.8 % (ref 11.5–15.5)
WBC: 11.8 10*3/uL — ABNORMAL HIGH (ref 4.0–10.5)
nRBC: 0 % (ref 0.0–0.2)

## 2024-11-09 LAB — BASIC METABOLIC PANEL WITH GFR
Anion gap: 12 (ref 5–15)
BUN: 18 mg/dL (ref 8–23)
CO2: 21 mmol/L — ABNORMAL LOW (ref 22–32)
Calcium: 9.1 mg/dL (ref 8.9–10.3)
Chloride: 104 mmol/L (ref 98–111)
Creatinine, Ser: 1.22 mg/dL — ABNORMAL HIGH (ref 0.44–1.00)
GFR, Estimated: 50 mL/min — ABNORMAL LOW
Glucose, Bld: 104 mg/dL — ABNORMAL HIGH (ref 70–99)
Potassium: 3.6 mmol/L (ref 3.5–5.1)
Sodium: 138 mmol/L (ref 135–145)

## 2024-11-09 LAB — HIV ANTIBODY (ROUTINE TESTING W REFLEX): HIV Screen 4th Generation wRfx: NONREACTIVE

## 2024-11-09 MED ORDER — SODIUM CHLORIDE 0.9 % IV SOLN: INTRAVENOUS | Status: AC

## 2024-11-09 MED ORDER — CYCLOBENZAPRINE HCL 5 MG PO TABS
5.0000 mg | ORAL_TABLET | Freq: Three times a day (TID) | ORAL | Status: AC | PRN
  Administered 2024-11-10 (×2): 5 mg via ORAL
  Filled 2024-11-09 (×2): qty 1

## 2024-11-09 MED ORDER — TECHNETIUM TC 99M MEBROFENIN IV KIT
5.4600 | PACK | Freq: Once | INTRAVENOUS | Status: AC | PRN
  Administered 2024-11-09: 5.46 via INTRAVENOUS

## 2024-11-09 NOTE — Progress Notes (Addendum)
 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  with temperature to 102 at home with severe chills and weakness.  ED work up showed  Tmax 100.4 , white cell count 17 K and had abnormal urinalysis which was cloudy , positive leukocytes, many bacteria , protein > 300., negative COVID and RSV. CT renal stone study > 1. No acute findings to explain the patient's pain. 2. Hepatic steatosis with treated hepatic metastatic disease. 3. Trace right pleural fluid. 4. Cholelithiasis. 5. Grade 3 anterolisthesis of L5 on S1 with advanced secondary degenerative disc disease. Patient was admitted with sepsis secondary to urinary tract infection and started on IV Ceftriaxone . She has been evaluated by ID and recommended to  continue IV ceftriaxone . Follow up urine culture. Possible consult urology for further input.  Of note , patient was admitted from January 25 through January 31 for fever of unknown origin and acute kidney injury.  She was treated for suspected UTI . But urine culture and blood cultures were negative.  Infectious disease was consulted.  Her workup included an MRI of the T-spine, L-spine, and right hip.  She had musculoskeletal findings including a torn labrum and a small hip joint effusion but no evidence of infection was found on the MRI.  She did have a dental extraction and uterus ablation the week prior to admission so a CT of the maxilfoacial region and of the abdomen pelvis were also done.  No clear source of infection was found.  She was treated with cefepime  IV for 4 days for possible UTI. On discharge , patient was doing better until yesterday       Assessment and Plan: Sepsis felt to be secondary to urine tract  infection Admitted with fever of 100.4 in ED and leukocytosis . Patient had abnormal urinalysis showing many bacteria and leukocytes Continue IV ceftriaxone  ID input noted and appreciated  2. Urinary Tract infection with hematuria  Continue IV antibiotic  Follow urin culture Not having hematuria at the moment. Most likely this is caused by infection. Consult urology since she has had this recurrent urinary tract infection  3. cholelithiasis  HIDA scan > no evidence of acute cholecystitis  4. Acute kidney Injury Felt to be secondary to some volume depletion On admission , creatine 1.50 and improving with IV fluid hydration. Current creatine 1.22 Avoids nephrotoxins agents 5. Anemia  Hemoglobin 10.0. Bit drop from 11.9 on admission but her hemoglobin was 10.8 on 11/01/24. This may be dilutional effect since she is receiving IV fluid  No active bleeding  Continue to monitor  6. History of rectal cancer with mets to the liver now in remission  Need to follow up outpatient with oncology  Subjective Seen today> she is lying in bed. She is kind of frustrated but denies back pain, no sob, no chest pain, no headache. She stated that she feel back pain on and off .   Physical Exam: Vitals:   11/09/24 0017 11/09/24 0430 11/09/24 0930 11/09/24 1344  BP: (!) 145/73 139/72 104/69 96/62  Pulse: (!) 105 95 96 93  Resp: 19 20 18    Temp: 97.9 F (36.6 C) 99 F (37.2 C) 99.9 F (37.7 C) 98.9 F (37.2  C)  TempSrc: Oral Oral Oral Oral  SpO2: 100% 94% 95% 97%   HEENT Head atraumatic Neck supple Resp: no wheezes No crackles CVS: s1/s2+ No JVD ABD: soft, +BS No tender EXT: no edema Neuro: A&0x3 No focal neurology deficit  Psych: Moods and affect appropriate Data Reviewed: {  Family Communication: no family at bedside   Disposition: Status is: Inpatient Remains inpatient appropriate because: admitted with sepsis secondary to urinary tract infection . Continue IV antibiotic. Follow up  urine culture   Planned Discharge Destination: Home   Time spent: 40  minutes  Author: Jefferson Marinas, MD 11/09/2024 4:57 PM  For on call review [email or web address].  "

## 2024-11-09 NOTE — Consult Note (Addendum)
 "       Date of Admission:  11/08/2024          Reason for Consult: Fevers hematuria and worsening lower right sided back pain (chronic) now worse with urination    Referring Provider: Jefferson Marinas, MD   Assessment:  Fevers, hematuria, could be due to UTI (last time her story sounded good this though her urine culture post one dose of cefepime  was negative we gave her 4+ days of cefepime ) Hx of recent workup by our group with among other things patient having had CT MF, CT Abdomen and pelvis, MRI T and L spine, and MRI right hip, dopplers Chronic right lower back pain of unclear cause-the patient is convinced that it is due to injury to her kidney that occurred when show her rectal cancer was being treated Hx of ureteral injury sp repair Hx of rectal cancer with mets to liver sp surgery, ablation to liver x 2, radiation   Plan:  Continue ceftriaxone  Follow-up urine and blood cultures Could consider urological consultation Standard universal precautions      HPI: Norma Colon is a 64 year old woman with a history of rectal cancer with metastasis to the liver status post surgery and then also radioablation of liver metastasis with course complicated by ureteral injury requiring 2 urological surgeries also status post chemotherapy who developed chronic right sided pain which is actually more in her lower right side rather than being at the costovertebral angle or where the kidney is though she is convinced that this pain corresponds to where her kidney is.  She apparently had baseline has pain here which she attributes as going back to when she had her original surgeries.  Last time I talked to her I thought that she had said that it went back to the ablation procedure but the day she tells me goes back to that surgery so at baseline she has pain in this area but more so when she exerts herself.  Prior to her last admission she had worsening of this pain so that she was  experiencing every time she had to urinate and she had a temperature of 102 at home.  She came into the ER at that time and had pyuria and a measured temperature of 102.8 degrees.  She had blood cultures taken and also urine culture taken though the latter appears to been taken after cefepime  was given almost completely sterile.  She received cefepime  from the 24th through the 27th when it was discontinued she had an extensive workup for other infectious causes of her symptoms including CT maxillofacial and (she had had recent dental extraction prior to her symptoms) CT abdomen pelvis, MRI thoracic spine and MRI of the hip.  She was observed off antibiotics and did not have any clinical worsening other than her complaining of pain still being present she now returns again with worsening of the same pain and hematuria, and fever with temperature of 102 degrees the temperature here in the ER was not measured as high as she measured at home.  Her admission labs were pertinent for leukocytosis with a white blood cell count of 17,000 and ANC of 15,100 on admission.  Comprehensive metabolic panel significant for acute kidney injury with creatinine up to 1.5 slightly elevated bilirubin 1.7 but otherwise normal unction test.  Urine did have greater than 50 white blood cells 0-5 non-squamous epithelial cells 21-50 red blood cells and many bacteria seen.  Urine culture appears to be incubating blood cultures  were taken again are noted negative so far.  CT renal was done which shows no acute findings but some hepatic steatosis and trace right pleural fluid as well as cholelithiasis and some grade 3 anterolisthesis of L5 on S1.  She is receiving ceftriaxone  1 g daily and currently has no fevers though she still has a significant pain that she spoke of before.  She is convinced that it is due to damage to her kidney though I cannot find any objective evidence that the kidney has been damaged and that that is  causing the pain the pain she actually shows me is not anatomically where the kidney would be located.  For now would continue ceftriaxone  follow-up culture data consider urological consult for her complaints of pain when she is having to urinate.     I personally spent a total of 80 minutes in the care of the patient today including preparing to see the patient, getting/reviewing separately obtained history, performing a medically appropriate exam/evaluation, counseling and educating, placing orders, referring and communicating with other health care professionals, documenting clinical information in the EHR, independently interpreting results, and communicating results.   Evaluation of the patient requires complex antimicrobial therapy evaluation, counseling , isolation needs to reduce disease transmission and risk assessment and mitigation.     Review of Systems: Review of Systems  Constitutional:  Positive for chills and fever. Negative for malaise/fatigue and weight loss.  HENT:  Negative for congestion and sore throat.   Eyes:  Negative for blurred vision and photophobia.  Respiratory:  Negative for cough, shortness of breath and wheezing.   Cardiovascular:  Negative for chest pain, palpitations and leg swelling.  Gastrointestinal:  Negative for abdominal pain, blood in stool, constipation, diarrhea, heartburn, melena, nausea and vomiting.  Genitourinary:  Positive for flank pain and hematuria. Negative for dysuria.  Musculoskeletal:  Negative for back pain, falls, joint pain and myalgias.  Skin:  Negative for itching and rash.  Neurological:  Negative for dizziness, focal weakness, loss of consciousness, weakness and headaches.  Endo/Heme/Allergies:  Does not bruise/bleed easily.  Psychiatric/Behavioral:  Negative for depression and suicidal ideas. The patient does not have insomnia.     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[facility]) oncologist-- dr  cloretta--  per last note in epic ,  clinical remission   dx 08/ 2019---- chemoradiation concurrent completed 07-20-2018;  s/p  low anterior resection 09-16-2018;   chemo 10-17-2018  ot 12-19-2018   Anxiety    Arthritis    Chemotherapy induced neutropenia    Depression    Headache    migraines    History of HPV infection    History of kidney stones    Hydronephrosis, right    Restless leg syndrome    Scoliosis    Sepsis (HCC)    11-2018    Social History[1]  Family History  Problem Relation Age of Onset   COPD Father        smoker   Stomach cancer Father        spread to lungs and bones   Arthritis Mother    Heart attack Paternal Grandmother    Heart attack Paternal Grandfather    Heart attack Maternal Grandmother    Stroke Maternal Grandmother    Colon cancer Neg Hx    Rectal cancer Neg Hx    Esophageal cancer Neg Hx    Allergies[2]  OBJECTIVE: Blood pressure 96/62, pulse 93, temperature 98.9 F (37.2 C), temperature source  Oral, resp. rate 18, SpO2 97%.  Physical Exam Constitutional:      General: She is not in acute distress.    Appearance: Normal appearance. She is well-developed. She is not ill-appearing or diaphoretic.  HENT:     Head: Normocephalic and atraumatic.     Right Ear: Hearing and external ear normal.     Left Ear: Hearing and external ear normal.     Nose: No nasal deformity or rhinorrhea.  Eyes:     General: No scleral icterus.    Conjunctiva/sclera: Conjunctivae normal.     Right eye: Right conjunctiva is not injected.     Left eye: Left conjunctiva is not injected.     Pupils: Pupils are equal, round, and reactive to light.  Neck:     Vascular: No JVD.  Cardiovascular:     Rate and Rhythm: Normal rate and regular rhythm.     Heart sounds: S1 normal and S2 normal.  Pulmonary:     Effort: Pulmonary effort is normal. No respiratory distress.  Abdominal:     General: Bowel sounds are normal. There is no distension.     Palpations:  Abdomen is soft.     Tenderness: There is no abdominal tenderness.  Musculoskeletal:        General: Normal range of motion.     Right shoulder: Normal.     Left shoulder: Normal.     Cervical back: Normal range of motion and neck supple.     Right hip: Normal.     Left hip: Normal.     Right knee: Normal.     Left knee: Normal.  Lymphadenopathy:     Head:     Right side of head: No submandibular, preauricular or posterior auricular adenopathy.     Left side of head: No submandibular, preauricular or posterior auricular adenopathy.     Cervical: No cervical adenopathy.     Right cervical: No superficial or deep cervical adenopathy.    Left cervical: No superficial or deep cervical adenopathy.  Skin:    General: Skin is warm and dry.     Coloration: Skin is not pale.     Findings: No abrasion, bruising, ecchymosis, erythema, lesion or rash.     Nails: There is no clubbing.  Neurological:     Mental Status: She is alert and oriented to person, place, and time.     Sensory: No sensory deficit.     Coordination: Coordination normal.     Gait: Gait normal.  Psychiatric:        Attention and Perception: She is attentive.        Mood and Affect: Mood normal.        Speech: Speech normal.        Behavior: Behavior normal. Behavior is cooperative.        Thought Content: Thought content normal.        Judgment: Judgment normal.     Lab Results Lab Results  Component Value Date   WBC 11.8 (H) 11/09/2024   HGB 10.0 (L) 11/09/2024   HCT 30.6 (L) 11/09/2024   MCV 94.2 11/09/2024   PLT 257 11/09/2024    Lab Results  Component Value Date   CREATININE 1.22 (H) 11/09/2024   BUN 18 11/09/2024   NA 138 11/09/2024   K 3.6 11/09/2024   CL 104 11/09/2024   CO2 21 (L) 11/09/2024    Lab Results  Component Value Date   ALT 43 11/08/2024  AST 29 11/08/2024   ALKPHOS 109 11/08/2024   BILITOT 1.7 (H) 11/08/2024     Microbiology: Recent Results (from the past 240 hours)  Blood  Culture (routine x 2)     Status: None (Preliminary result)   Collection Time: 11/08/24  1:30 PM   Specimen: BLOOD  Result Value Ref Range Status   Specimen Description   Final    BLOOD SITE NOT SPECIFIED Performed at [facility], [address] [place], [place] [address]    Special Requests   Final    BOTTLES DRAWN AEROBIC AND ANAEROBIC Blood Culture adequate volume Performed at [facility], [address], [place], [place] [address]    Culture   Final    NO GROWTH < 24 HOURS Performed at [facility], [address] [place], [place] [address]    Report Status PENDING  Incomplete  Resp panel by RT-PCR (RSV, Flu A&B, Covid)     Status: None   Collection Time: 11/08/24  1:30 PM   Specimen: Nasal Swab  Result Value Ref Range Status   SARS Coronavirus 2 by RT PCR NEGATIVE NEGATIVE Final    Comment: (NOTE) SARS-CoV-2 target nucleic acids are NOT DETECTED.  The SARS-CoV-2 RNA is generally detectable in upper respiratory specimens during the acute phase of infection. The lowest concentration of SARS-CoV-2 viral copies this assay can detect is 138 copies/mL. A negative result does not preclude SARS-Cov-2 infection and should not be used as the sole basis for treatment or other patient management decisions. A negative result may occur with  improper specimen collection/handling, submission of specimen other than nasopharyngeal swab, presence of viral mutation(s) within the areas targeted by this assay, and inadequate number of viral copies(<138 copies/mL). A negative result must be combined with clinical observations, patient history, and epidemiological information. The expected result is Negative.  Fact Sheet for Patients:  bloggercourse.com  Fact Sheet for Healthcare Providers:  seriousbroker.it  This test is no t yet approved or cleared by the [place]  FDA and  has been  authorized for detection and/or diagnosis of SARS-CoV-2 by FDA under an Emergency Use Authorization (EUA). This EUA will remain  in effect (meaning this test can be used) for the duration of the COVID-19 declaration under Section 564(b)(1) of the Act, 21 U.S.C.section 360bbb-3(b)(1), unless the authorization is terminated  or revoked sooner.       Influenza A by PCR NEGATIVE NEGATIVE Final   Influenza B by PCR NEGATIVE NEGATIVE Final    Comment: (NOTE) The Xpert Xpress SARS-CoV-2/FLU/RSV plus assay is intended as an aid in the diagnosis of influenza from Nasopharyngeal swab specimens and should not be used as a sole basis for treatment. Nasal washings and aspirates are unacceptable for Xpert Xpress SARS-CoV-2/FLU/RSV testing.  Fact Sheet for Patients: bloggercourse.com  Fact Sheet for Healthcare Providers: seriousbroker.it  This test is not yet approved or cleared by the [place]  FDA and has been authorized for detection and/or diagnosis of SARS-CoV-2 by FDA under an Emergency Use Authorization (EUA). This EUA will remain in effect (meaning this test can be used) for the duration of the COVID-19 declaration under Section 564(b)(1) of the Act, 21 U.S.C. section 360bbb-3(b)(1), unless the authorization is terminated or revoked.     Resp Syncytial Virus by PCR NEGATIVE NEGATIVE Final    Comment: (NOTE) Fact Sheet for Patients: bloggercourse.com  Fact Sheet for Healthcare Providers: seriousbroker.it  This test is not yet approved or cleared by the [place]  [place] FDA and has been authorized for detection and/or diagnosis of SARS-CoV-2 by FDA under an Emergency Use Authorization (EUA). This EUA will remain in effect (meaning this test can be used) for the duration of the COVID-19 declaration under Section 564(b)(1) of the Act, 21 U.S.C. section 360bbb-3(b)(1), unless the  authorization is terminated or revoked.  Performed at [facility], [address], [place], [place] [address]   Blood Culture (routine x 2)     Status: None (Preliminary result)   Collection Time: 11/08/24  1:50 PM   Specimen: BLOOD  Result Value Ref Range Status   Specimen Description   Final    BLOOD SITE NOT SPECIFIED Performed at [facility], [address], [place], [place] [address]    Special Requests   Final    BOTTLES DRAWN AEROBIC AND ANAEROBIC Blood Culture adequate volume Performed at [facility], [address], [place], [place] [address]    Culture   Final    NO GROWTH < 24 HOURS Performed at [facility], [address], [place], [place] [address]    Report Status PENDING  Incomplete    Jomarie Fleeta Rothman, MD [facility] for Infectious Disease [facility] [phone] pager  11/09/2024, 3:18 PM     [1]  Social History Tobacco Use   Smoking status: Former    Current packs/day: 0.00    Average packs/day: 1 pack/day for 30.0 years (30.0 ttl pk-yrs)    Types: Cigarettes    Start date: 09/16/1988    Quit date: 09/16/2018    Years since quitting: 6.1   Smokeless tobacco: Never  Vaping Use   Vaping status: Never Used  Substance Use Topics   Alcohol use: Yes    Comment: social   Drug use: Never  [2]  Allergies Allergen Reactions   Prednisone Other (See Comments)    Felt like she was crazy if on it more than 1 week   Codeine Other (See Comments)    Causes migraine--patient requests this be kept on allergy list   "

## 2024-11-09 NOTE — Progress Notes (Signed)
 At bedside to place PIV as requested.  Pt states she is being dc to home and does not want to be stuck again.  Current PIV flushed without difficulty. No new IV placed.

## 2024-11-09 NOTE — Telephone Encounter (Signed)
 Norma Colon left VM requesting Dr. Cloretta to be aware of her current hospitalization. Admitted with 102 temp,chills, hematuria and right flank pain. States on scans there was some areas of concern re: liver. Asking for Dr. Cloretta to see her and assist with her inpatient care.

## 2024-11-09 NOTE — TOC Initial Note (Signed)
 Transition of Care [facility]) - Initial/Assessment Note    Patient Details  Name: Norma Colon MRN: [id number] Date of Birth: April 29, 1961  Transition of Care [facility]) CM/SW Contact:    Sonda Manuella Quill, RN Phone Number: 11/09/2024, 5:06 PM  Clinical Narrative:                 Beatris w/ pt in room; pt said she lives at home; she plans to return w/ family support at d/c; pt said she will drive herself home at d/c; her POC is Marval Pitch (cousin) [phone]; insurance/PCP verified; pt said she has difficulty paying utilities; she denied other SDOH risks; pt does not have DME, HH services, or home oxygen; pt agreed to receive resources for social services and financial assistance; resources placed in d/c instructions; pt verbalized understanding she will make her own appt w/ agencies of choice; IPM will follow.  Expected Discharge Plan: Home/Self Care Barriers to Discharge: Continued Medical Work up   Patient Goals and CMS Choice Patient states their goals for this hospitalization and ongoing recovery are:: home          Expected Discharge Plan and Services   Discharge Planning Services: CM Consult   Living arrangements for the past 2 months: Single Family Home                 DME Arranged: N/A DME Agency: NA       HH Arranged: NA HH Agency: NA        Prior Living Arrangements/Services Living arrangements for the past 2 months: Single Family Home Lives with:: Self Patient language and need for interpreter reviewed:: Yes Do you feel safe going back to the place where you live?: Yes      Need for Family Participation in Patient Care: Yes (Comment) Care giver support system in place?: Yes (comment) Current home services:  (n/a) Criminal Activity/Legal Involvement Pertinent to Current Situation/Hospitalization: No - Comment as needed  Activities of Daily Living   ADL Screening (condition at time of admission) Independently performs ADLs?: Yes (appropriate for  developmental age) Is the patient deaf or have difficulty hearing?: No Does the patient have difficulty seeing, even when wearing glasses/contacts?: No Does the patient have difficulty concentrating, remembering, or making decisions?: No  Permission Sought/Granted Permission sought to share information with : Case Manager Permission granted to share information with : Yes, Verbal Permission Granted  Share Information with NAME: Case Manager     Permission granted to share info w Relationship: Marval Pitch (cousin) [phone]     Emotional Assessment Appearance:: Appears stated age Attitude/Demeanor/Rapport: Gracious Affect (typically observed): Accepting Orientation: : Oriented to Self, Oriented to Place, Oriented to  Time, Oriented to Situation Alcohol / Substance Use: Not Applicable Psych Involvement: No (comment)  Admission diagnosis:  Cystitis [N30.90] Sepsis (HCC) [A41.9] Patient Active Problem List   Diagnosis Date Noted   Hematuria 11/09/2024   Right low back pain 11/09/2024   Obesity, class 1 11/04/2024   Constipation 11/01/2024   Restless leg syndrome 11/01/2024   Anxiety and depression 11/01/2024   Acute kidney injury 10/31/2024   Right ureteral injury 10/31/2024   Fever 10/31/2024   Edentulous 10/31/2024   Chronic flank pain 10/31/2024   Right flank pain 10/28/2024   Cough 08/26/2021   Complicated UTI (urinary tract infection) 08/25/2021   Infection due to Enterobacter cloacae 08/25/2021   Hypokalemia 08/25/2021   Colon cancer metastasized to liver (HCC) 07/30/2021   Goals of care, counseling/discussion 09/12/2020  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 [place] [address] Phone: [phone] Fax: [phone]     Social Drivers of Health (SDOH) Social History: SDOH Screenings   Food Insecurity: No Food Insecurity (11/09/2024)  Housing: Low Risk (11/09/2024)  Transportation Needs: No Transportation Needs (11/09/2024)  Utilities: At Risk (11/09/2024)  Depression (PHQ2-9): Low Risk (08/10/2024)  Tobacco Use: Medium Risk (10/28/2024)   SDOH Interventions: Food Insecurity Interventions: Intervention Not Indicated, Inpatient TOC Housing Interventions: Intervention Not Indicated, Inpatient TOC Transportation Interventions: Intervention Not Indicated, Inpatient TOC Utilities Interventions: Community Resources Provided, Inpatient TOC   Readmission Risk Interventions    11/09/2024    5:04 PM  Readmission Risk Prevention Plan  Transportation Screening Complete  PCP or Specialist Appt within 5-7 Days Complete  Home Care Screening Complete  Medication Review (RN CM) Complete

## 2024-11-09 NOTE — Plan of Care (Signed)
   Problem: Education: Goal: Knowledge of General Education information will improve Description: Including pain rating scale, medication(s)/side effects and non-pharmacologic comfort measures Outcome: Progressing   Problem: Health Behavior/Discharge Planning: Goal: Ability to manage health-related needs will improve Outcome: Progressing   Problem: Clinical Measurements: Goal: Will remain free from infection Outcome: Progressing

## 2024-11-09 NOTE — Telephone Encounter (Signed)
 LVM that Dr. Cloretta will look at her scan reports again, but at quick glance does not show any new lesions of liver, just prior areas of treatment. If she is still in hospital on Monday, he will check on her.

## 2024-11-09 NOTE — Plan of Care (Signed)

## 2024-11-10 LAB — BASIC METABOLIC PANEL WITH GFR
Anion gap: 11 (ref 5–15)
BUN: 15 mg/dL (ref 8–23)
CO2: 21 mmol/L — ABNORMAL LOW (ref 22–32)
Calcium: 8.5 mg/dL — ABNORMAL LOW (ref 8.9–10.3)
Chloride: 108 mmol/L (ref 98–111)
Creatinine, Ser: 1.03 mg/dL — ABNORMAL HIGH (ref 0.44–1.00)
GFR, Estimated: >60 mL/min
Glucose, Bld: 129 mg/dL — ABNORMAL HIGH (ref 70–99)
Potassium: 3.5 mmol/L (ref 3.5–5.1)
Sodium: 140 mmol/L (ref 135–145)

## 2024-11-10 LAB — CBC
HCT: 28.9 % — ABNORMAL LOW (ref 36.0–46.0)
Hemoglobin: 9.1 g/dL — ABNORMAL LOW (ref 12.0–15.0)
MCH: 30.3 pg (ref 26.0–34.0)
MCHC: 31.5 g/dL (ref 30.0–36.0)
MCV: 96.3 fL (ref 80.0–100.0)
Platelets: 238 10*3/uL (ref 150–400)
RBC: 3 MIL/uL — ABNORMAL LOW (ref 3.87–5.11)
RDW: 13.8 % (ref 11.5–15.5)
WBC: 4.3 10*3/uL (ref 4.0–10.5)
nRBC: 0 % (ref 0.0–0.2)

## 2024-11-10 LAB — URINE CULTURE: Culture: >=100000 — AB

## 2024-11-10 LAB — CULTURE, BLOOD (ROUTINE X 2)
Culture: NO GROWTH
Culture: NO GROWTH
Special Requests: ADEQUATE
Special Requests: ADEQUATE

## 2024-11-10 MED ORDER — CEFADROXIL 500 MG PO CAPS
1000.0000 mg | ORAL_CAPSULE | Freq: Two times a day (BID) | ORAL | Status: AC
  Administered 2024-11-10: 1000 mg via ORAL
  Filled 2024-11-10: qty 2

## 2024-11-10 MED ORDER — SODIUM CHLORIDE 0.9 % IV SOLN: INTRAVENOUS | Status: AC

## 2024-11-10 MED ORDER — CEFADROXIL 500 MG PO CAPS
500.0000 mg | ORAL_CAPSULE | Freq: Two times a day (BID) | ORAL | Status: DC
  Administered 2024-11-10: 500 mg via ORAL
  Filled 2024-11-10: qty 1

## 2024-11-10 NOTE — Progress Notes (Addendum)
 " Progress Note   Patient: Norma Colon FMW:[id number] DOB: 1960-11-09 DOA: 11/08/2024     2 DOS: the patient was seen and examined on 11/10/2024   Brief hospital course:  64 y/o female with hx of rectal cancer with metastatic to the liver now in remission who presented top ED with fever/chills and weakness.  with temperature to 102 at home with severe chills and weakness.  ED work up showed  Tmax 100.4 , white cell count 17 K and had abnormal urinalysis which was cloudy , positive leukocytes, many bacteria , protein > 300., negative COVID and RSV. CT renal stone study > 1. No acute findings to explain the patient's pain. 2. Hepatic steatosis with treated hepatic metastatic disease. 3. Trace right pleural fluid. 4. Cholelithiasis. 5. Grade 3 anterolisthesis of L5 on S1 with advanced secondary degenerative disc disease. Patient was admitted with sepsis secondary to urinary tract infection and started on IV Ceftriaxone . She has been evaluated by ID and recommended to  continue IV ceftriaxone . Follow up urine culture. Possible consult urology for further input.  Of note , patient was admitted from January 25 through January 31 for fever of unknown origin and acute kidney injury.  She was treated for suspected UTI . But urine culture and blood cultures were negative.  Infectious disease was consulted.  Her workup included an MRI of the T-spine, L-spine, and right hip.  She had musculoskeletal findings including a torn labrum and a small hip joint effusion but no evidence of infection was found on the MRI.  She did have a dental extraction and uterus ablation the week prior to admission so a CT of the maxilfoacial region and of the abdomen pelvis were also done.  No clear source of infection was found.  She was treated with cefepime  IV for 4 days for possible UTI. On discharge , patient was doing better until yesterday        Assessment and Plan: Sepsis felt to be secondary to urine tract  infection Secondary to urinary tract infection Urine culture grew E.coli sensitive to Duricef Switched to Duricef 1000 mg twice daily  2. Urinary Tract infection with hematuria  Continue IV antibiotic  Not having hematuria at the moment. Most likely this is caused by infection. Recent admission on 11/03/24 , patient was seen by urology Dr. Adine Manly and recommended patient [facility]. Since she is not having no hematuria  at this time, patient will need to follow up with [facility] after discharge. Case discussed with Dr. Manly via secure epic chat. He is not on call and recommended if patient needed to see urology as inpatient to consult urology on call.  Her hematuria has resolved. Patient need to keep up with prior schedule outpatient follow up with [facility]  3. cholelithiasis  HIDA scan > no evidence of acute cholecystitis  4. Acute kidney Injury Felt to be secondary to some volume depletion On admission , creatine 1.50 and improving with IV fluid hydration. Current creatine 1.03 Continue IV fluid hydration  5. Anemia  Hemoglobin 9.1 and dropped 11.9 on admission but her hemoglobin was 10.8 on 11/01/24. This may be dilutional effect since she is receiving IV fluid complicated by blood loss in the setting of hematuria in this patient with urinary tract infection  No active bleeding  Continue to monitor  Will check stool for occult blood  Recheck hemoglobin in am Iron studies 6. History of rectal cancer with mets to the liver now in  remission  Need to follow up outpatient with oncology  Subjective Stated that during the night she felt dizzy. But she did not pass out. Denies sob, no chest pain, no abdominal pain, no headache  Physical Exam: Vitals:   11/09/24 2118 11/09/24 2216 11/10/24 0512 11/10/24 1315  BP: 110/82 119/70 111/66 (!) 123/58  Pulse: 98 88 79 77  Resp: 18 18 20 15   Temp:  99.1 F (37.3 C) 97.8 F (36.6 C) 98.2 F (36.8 C)  TempSrc:   Oral    SpO2: 96% 100% 96% 96%   HEENT Head atraumatic Neck supple Resp: no wheezes No crackles CVS: s1/s2+ No JVD ABD: soft, +BS No tender EXT: no edema Neuro: A&0x3 No focal neurology deficit  Psych: Moods and affect appropriate Data Reviewed:   Family Communication: no family at bedside   Disposition: Status is: Inpatient Remains inpatient appropriate because: admitted with sepsis secondary to urinary tract infection . Continue IV fluid . Plan to discharge home in am   Planned Discharge Destination: Home   Time spent: 40  minutes  Author: Jefferson Marinas, MD 11/10/2024 5:24 PM  For on call review [email or web address].  "

## 2024-11-10 NOTE — Progress Notes (Signed)
 "       Subjective:  Her pain with urination I her flank has resolved but she felt queezy going to her car with RN staff last night   Antibiotics:  Anti-infectives (From admission, onward)    Start     Dose/Rate Route Frequency Ordered Stop   11/10/24 1300  cefadroxil  (DURICEF) capsule 500 mg        500 mg Oral 2 times daily 11/10/24 1201     11/08/24 1500  cefTRIAXone  (ROCEPHIN ) 1 g in sodium chloride  0.9 % 100 mL IVPB  Status:  Discontinued        1 g 200 mL/hr over 30 Minutes Intravenous Every 24 hours 11/08/24 1454 11/10/24 1201       Medications: Scheduled Meds:  alprazolam   2 mg Oral q morning   buPROPion   300 mg Oral q morning   cefadroxil   500 mg Oral BID   enoxaparin  (LOVENOX ) injection  40 mg Subcutaneous Q24H   rOPINIRole   0.25 mg Oral QHS   sodium chloride  flush  3 mL Intravenous Q12H   Continuous Infusions:  sodium chloride  150 mL/hr at 11/10/24 0111   PRN Meds:.acetaminophen  **OR** acetaminophen , albuterol , bisacodyl , cyclobenzaprine , ondansetron  **OR** ondansetron  (ZOFRAN ) IV, traMADol     Objective: Weight change:   Intake/Output Summary (Last 24 hours) at 11/10/2024 1241 Last data filed at 11/10/2024 0900 Gross per 24 hour  Intake 480 ml  Output --  Net 480 ml   Blood pressure 111/66, pulse 79, temperature 97.8 F (36.6 C), resp. rate 20, SpO2 96%. Temp:  [97.8 F (36.6 C)-99.1 F (37.3 C)] 97.8 F (36.6 C) (02/06 0512) Pulse Rate:  [79-98] 79 (02/06 0512) Resp:  [18-20] 20 (02/06 0512) BP: (96-119)/(62-82) 111/66 (02/06 0512) SpO2:  [96 %-100 %] 96 % (02/06 0512)  Physical Exam: Physical Exam Constitutional:      General: She is not in acute distress.    Appearance: She is well-developed. She is not diaphoretic.  HENT:     Head: Normocephalic and atraumatic.     Right Ear: External ear normal.     Left Ear: External ear normal.     Mouth/Throat:     Pharynx: No oropharyngeal exudate.  Eyes:     General: No scleral icterus.     Conjunctiva/sclera: Conjunctivae normal.     Pupils: Pupils are equal, round, and reactive to light.  Cardiovascular:     Rate and Rhythm: Normal rate and regular rhythm.  Pulmonary:     Effort: Pulmonary effort is normal. No respiratory distress.     Breath sounds: No wheezing.  Abdominal:     General: There is no distension.     Palpations: Abdomen is soft.  Musculoskeletal:        General: No tenderness. Normal range of motion.  Lymphadenopathy:     Cervical: No cervical adenopathy.  Skin:    General: Skin is warm and dry.     Coloration: Skin is not pale.     Findings: No erythema or rash.  Neurological:     General: No focal deficit present.     Mental Status: She is alert and oriented to person, place, and time.     Motor: No abnormal muscle tone.     Coordination: Coordination normal.  Psychiatric:        Mood and Affect: Mood normal.        Behavior: Behavior normal.        Thought Content: Thought content normal.  Judgment: Judgment normal.      CBC:    BMET Recent Labs    11/09/24 0453 11/10/24 0506  NA 138 140  K 3.6 3.5  CL 104 108  CO2 21* 21*  GLUCOSE 104* 129*  BUN 18 15  CREATININE 1.22* 1.03*  CALCIUM  9.1 8.5*     Liver Panel  Recent Labs    11/08/24 1330  PROT 7.8  ALBUMIN 3.7  AST 29  ALT 43  ALKPHOS 109  BILITOT 1.7*       Sedimentation Rate No results for input(s): ESRSEDRATE in the last 72 hours. C-Reactive Protein No results for input(s): CRP in the last 72 hours.  Micro Results: Recent Results (from the past 720 hours)  Blood Culture (routine x 2)     Status: None   Collection Time: 10/28/24  7:20 PM   Specimen: BLOOD RIGHT WRIST  Result Value Ref Range Status   Specimen Description   Final    BLOOD RIGHT WRIST Performed at [facility], [address], [place], [place] [address]    Special Requests   Final    BOTTLES DRAWN AEROBIC AND ANAEROBIC Blood Culture adequate  volume Performed at [facility], [address] [place], [place] [address]    Culture   Final    NO GROWTH 5 DAYS Performed at [facility], [address] [place], [place] [address]    Report Status 11/03/2024 FINAL  Final  Blood Culture (routine x 2)     Status: None   Collection Time: 10/28/24  7:20 PM   Specimen: Left Antecubital; Blood  Result Value Ref Range Status   Specimen Description   Final    LEFT ANTECUBITAL Performed at [facility], [address], [place], [place] [address]    Special Requests   Final    BOTTLES DRAWN AEROBIC AND ANAEROBIC Blood Culture adequate volume Performed at [facility], [address], [place], [place] [address]    Culture   Final    NO GROWTH 5 DAYS Performed at [facility], [address] [place], [place] [address]    Report Status 11/03/2024 FINAL  Final  Resp panel by RT-PCR (RSV, Flu A&B, Covid) Peripheral     Status: None   Collection Time: 10/28/24  7:39 PM   Specimen: Peripheral; Nasal Swab  Result Value Ref Range Status   SARS Coronavirus 2 by RT PCR NEGATIVE NEGATIVE Final    Comment: (NOTE) SARS-CoV-2 target nucleic acids are NOT DETECTED.  The SARS-CoV-2 RNA is generally detectable in upper respiratory specimens during the acute phase of infection. The lowest concentration of SARS-CoV-2 viral copies this assay can detect is 138 copies/mL. A negative result does not preclude SARS-Cov-2 infection and should not be used as the sole basis for treatment or other patient management decisions. A negative result may occur with  improper specimen collection/handling, submission of specimen other than nasopharyngeal swab, presence of viral mutation(s) within the areas targeted by this assay, and inadequate number of viral copies(<138 copies/mL). A negative result must be combined with clinical observations, patient history, and epidemiological information.  The expected result is Negative.  Fact Sheet for Patients:  bloggercourse.com  Fact Sheet for Healthcare Providers:  seriousbroker.it  This test is no t yet approved or cleared by the [place]  FDA and  has been authorized for detection and/or diagnosis of SARS-CoV-2 by FDA under an Emergency Use Authorization (EUA). This EUA  will remain  in effect (meaning this test can be used) for the duration of the COVID-19 declaration under Section 564(b)(1) of the Act, 21 U.S.C.section 360bbb-3(b)(1), unless the authorization is terminated  or revoked sooner.       Influenza A by PCR NEGATIVE NEGATIVE Final   Influenza B by PCR NEGATIVE NEGATIVE Final    Comment: (NOTE) The Xpert Xpress SARS-CoV-2/FLU/RSV plus assay is intended as an aid in the diagnosis of influenza from Nasopharyngeal swab specimens and should not be used as a sole basis for treatment. Nasal washings and aspirates are unacceptable for Xpert Xpress SARS-CoV-2/FLU/RSV testing.  Fact Sheet for Patients: bloggercourse.com  Fact Sheet for Healthcare Providers: seriousbroker.it  This test is not yet approved or cleared by the [place]  FDA and has been authorized for detection and/or diagnosis of SARS-CoV-2 by FDA under an Emergency Use Authorization (EUA). This EUA will remain in effect (meaning this test can be used) for the duration of the COVID-19 declaration under Section 564(b)(1) of the Act, 21 U.S.C. section 360bbb-3(b)(1), unless the authorization is terminated or revoked.     Resp Syncytial Virus by PCR NEGATIVE NEGATIVE Final    Comment: (NOTE) Fact Sheet for Patients: bloggercourse.com  Fact Sheet for Healthcare Providers: seriousbroker.it  This test is not yet approved or cleared by the [place]  FDA and has been authorized for detection and/or  diagnosis of SARS-CoV-2 by FDA under an Emergency Use Authorization (EUA). This EUA will remain in effect (meaning this test can be used) for the duration of the COVID-19 declaration under Section 564(b)(1) of the Act, 21 U.S.C. section 360bbb-3(b)(1), unless the authorization is terminated or revoked.  Performed at [facility] [address], [place], [place] [address]   Urine Culture     Status: None   Collection Time: 10/28/24 11:09 PM   Specimen: Urine, Random  Result Value Ref Range Status   Specimen Description   Final    URINE, RANDOM Performed at [facility], [address] [place], [place] [address]    Special Requests   Final    NONE Reflexed from [id number] Performed at [facility], [address], [place], [place] [address]    Culture   Final    NO GROWTH Performed at [facility], [address], [place], [place] [address]    Report Status 10/30/2024 FINAL  Final  Urine Culture (for pregnant, neutropenic or urologic patients or patients with an indwelling urinary catheter)     Status: None   Collection Time: 10/30/24  3:07 PM   Specimen: Urine, Clean Catch  Result Value Ref Range Status   Specimen Description   Final    URINE, CLEAN CATCH Performed at [facility], [address], [place], [place] [address]    Special Requests   Final    NONE Performed at [facility], [address] [place], [place] [address]    Culture   Final    NO GROWTH Performed at [facility], [address], [place], [place] [address]    Report Status 10/31/2024 FINAL  Final  Blood Culture (routine x 2)     Status: None (Preliminary result)   Collection Time: 11/08/24  1:30 PM   Specimen: BLOOD  Result Value Ref Range Status   Specimen Description   Final    BLOOD SITE NOT SPECIFIED Performed at [facility], [address] [place], [place] [address]  Special Requests   Final    BOTTLES DRAWN AEROBIC AND ANAEROBIC Blood Culture adequate volume Performed at [facility], [address] [place], [place] [address]    Culture   Final    NO GROWTH 2 DAYS Performed at [facility], [address] [place], [place] [address]    Report Status PENDING  Incomplete  Resp panel by RT-PCR (RSV, Flu A&B, Covid)     Status: None   Collection Time: 11/08/24  1:30 PM   Specimen: Nasal Swab  Result Value Ref Range Status   SARS Coronavirus 2 by RT PCR NEGATIVE NEGATIVE Final    Comment: (NOTE) SARS-CoV-2 target nucleic acids are NOT DETECTED.  The SARS-CoV-2 RNA is generally detectable in upper respiratory specimens during the acute phase of infection. The lowest concentration of SARS-CoV-2 viral copies this assay can detect is 138 copies/mL. A negative result does not preclude SARS-Cov-2 infection and should not be used as the sole basis for treatment or other patient management decisions. A negative result may occur with  improper specimen collection/handling, submission of specimen other than nasopharyngeal swab, presence of viral mutation(s) within the areas targeted by this assay, and inadequate number of viral copies(<138 copies/mL). A negative result must be combined with clinical observations, patient history, and epidemiological information. The expected result is Negative.  Fact Sheet for Patients:  bloggercourse.com  Fact Sheet for Healthcare Providers:  seriousbroker.it  This test is no t yet approved or cleared by the [place]  FDA and  has been authorized for detection and/or diagnosis of SARS-CoV-2 by FDA under an Emergency Use Authorization (EUA). This EUA will remain  in effect (meaning this test can be used) for the duration of the COVID-19 declaration under Section 564(b)(1) of the Act, 21 U.S.C.section 360bbb-3(b)(1), unless the  authorization is terminated  or revoked sooner.       Influenza A by PCR NEGATIVE NEGATIVE Final   Influenza B by PCR NEGATIVE NEGATIVE Final    Comment: (NOTE) The Xpert Xpress SARS-CoV-2/FLU/RSV plus assay is intended as an aid in the diagnosis of influenza from Nasopharyngeal swab specimens and should not be used as a sole basis for treatment. Nasal washings and aspirates are unacceptable for Xpert Xpress SARS-CoV-2/FLU/RSV testing.  Fact Sheet for Patients: bloggercourse.com  Fact Sheet for Healthcare Providers: seriousbroker.it  This test is not yet approved or cleared by the [place]  FDA and has been authorized for detection and/or diagnosis of SARS-CoV-2 by FDA under an Emergency Use Authorization (EUA). This EUA will remain in effect (meaning this test can be used) for the duration of the COVID-19 declaration under Section 564(b)(1) of the Act, 21 U.S.C. section 360bbb-3(b)(1), unless the authorization is terminated or revoked.     Resp Syncytial Virus by PCR NEGATIVE NEGATIVE Final    Comment: (NOTE) Fact Sheet for Patients: bloggercourse.com  Fact Sheet for Healthcare Providers: seriousbroker.it  This test is not yet approved or cleared by the [place]  FDA and has been authorized for detection and/or diagnosis of SARS-CoV-2 by FDA under an Emergency Use Authorization (EUA). This EUA will remain in effect (meaning this test can be used) for the duration of the COVID-19 declaration under Section 564(b)(1) of the Act, 21 U.S.C. section 360bbb-3(b)(1), unless the authorization is terminated or revoked.  Performed at [facility], [address], [place], [place] [address]   Blood Culture (routine x 2)     Status: None (Preliminary result)   Collection Time: 11/08/24  1:50 PM   Specimen: BLOOD  Result Value Ref Range Status   Specimen  Description   Final    BLOOD SITE NOT SPECIFIED Performed at [facility], [address], [place], [place] [address]    Special Requests   Final    BOTTLES DRAWN AEROBIC AND ANAEROBIC Blood Culture adequate volume Performed at [facility], [address], [place], [place] [address]    Culture   Final    NO GROWTH 2 DAYS Performed at [facility], [address], [place], [place] [address]    Report Status PENDING  Incomplete  Urine Culture     Status: Abnormal   Collection Time: 11/08/24  3:09 PM   Specimen: Urine, Random  Result Value Ref Range Status   Specimen Description   Final    URINE, RANDOM Performed at [facility], [address] [place], [place] [address]    Special Requests   Final    NONE Reflexed from [id number] Performed at [facility], [address] [place], [place] [address]    Culture >=100,000 COLONIES/mL ESCHERICHIA COLI (A)  Final   Report Status 11/10/2024 FINAL  Final   Organism ID, Bacteria ESCHERICHIA COLI (A)  Final      Susceptibility   Escherichia coli - MIC*    AMPICILLIN 4 SENSITIVE Sensitive     CEFAZOLIN  (URINE) Value in next row Sensitive      2 SENSITIVEThis is a modified FDA-approved test that has been validated and its performance characteristics determined by the reporting laboratory.  This laboratory is certified under the Clinical Laboratory Improvement Amendments CLIA as qualified to perform high complexity clinical laboratory testing.    CEFEPIME  Value in next row Sensitive      2 SENSITIVEThis is a modified FDA-approved test that has been validated and its performance characteristics determined by the reporting laboratory.  This laboratory is certified under the Clinical Laboratory Improvement Amendments CLIA as qualified to perform high complexity clinical laboratory testing.    ERTAPENEM Value in next row Sensitive      2 SENSITIVEThis is a modified  FDA-approved test that has been validated and its performance characteristics determined by the reporting laboratory.  This laboratory is certified under the Clinical Laboratory Improvement Amendments CLIA as qualified to perform high complexity clinical laboratory testing.    CEFTRIAXONE  Value in next row Sensitive      2 SENSITIVEThis is a modified FDA-approved test that has been validated and its performance characteristics determined by the reporting laboratory.  This laboratory is certified under the Clinical Laboratory Improvement Amendments CLIA as qualified to perform high complexity clinical laboratory testing.    CIPROFLOXACIN  Value in next row Sensitive      2 SENSITIVEThis is a modified FDA-approved test that has been validated and its performance characteristics determined by the reporting laboratory.  This laboratory is certified under the Clinical Laboratory Improvement Amendments CLIA as qualified to perform high complexity clinical laboratory testing.    GENTAMICIN Value in next row Sensitive      2 SENSITIVEThis is a modified FDA-approved test that has been validated and its performance characteristics determined by the reporting laboratory.  This laboratory is certified under the Clinical Laboratory Improvement Amendments CLIA as qualified to perform high complexity clinical laboratory testing.    NITROFURANTOIN  Value in next row Sensitive      2 SENSITIVEThis is a modified FDA-approved test that has been validated and its performance characteristics determined  by the reporting laboratory.  This laboratory is certified under the Clinical Laboratory Improvement Amendments CLIA as qualified to perform high complexity clinical laboratory testing.    TRIMETH /SULFA  Value in next row Sensitive      2 SENSITIVEThis is a modified FDA-approved test that has been validated and its performance characteristics determined by the reporting laboratory.  This laboratory is certified under the Clinical  Laboratory Improvement Amendments CLIA as qualified to perform high complexity clinical laboratory testing.    AMPICILLIN/SULBACTAM Value in next row Sensitive      2 SENSITIVEThis is a modified FDA-approved test that has been validated and its performance characteristics determined by the reporting laboratory.  This laboratory is certified under the Clinical Laboratory Improvement Amendments CLIA as qualified to perform high complexity clinical laboratory testing.    PIP/TAZO Value in next row Sensitive      <=4 SENSITIVEThis is a modified FDA-approved test that has been validated and its performance characteristics determined by the reporting laboratory.  This laboratory is certified under the Clinical Laboratory Improvement Amendments CLIA as qualified to perform high complexity clinical laboratory testing.    MEROPENEM Value in next row Sensitive      <=4 SENSITIVEThis is a modified FDA-approved test that has been validated and its performance characteristics determined by the reporting laboratory.  This laboratory is certified under the Clinical Laboratory Improvement Amendments CLIA as qualified to perform high complexity clinical laboratory testing.    * >=100,000 COLONIES/mL ESCHERICHIA COLI    Studies/Results: NM Hepato W/EF Result Date: 11/09/2024 CLINICAL DATA:  Right upper quadrant abdominal pain radiating into the back. Known gallstones. EXAM: NUCLEAR MEDICINE HEPATOBILIARY IMAGING WITH GALLBLADDER EF TECHNIQUE: Sequential images of the abdomen were obtained out to 60 minutes following intravenous administration of radiopharmaceutical. After oral ingestion of Ensure, gallbladder ejection fraction was determined. At 60 min, normal ejection fraction is greater than 33%. RADIOPHARMACEUTICALS:  5.46 mCi Tc-30m  Choletec  IV COMPARISON:  Abdominopelvic CT 11/08/2024 and 10/28/2024 FINDINGS: Prompt uptake and biliary excretion of activity by the liver is seen. Biliary activity passes into small  bowel, consistent with patent common bile duct. Delayed gallbladder activity is demonstrated, consistent with patency of the cystic duct. There is limited gallbladder distension. The gallbladder is in close proximity to activity within small bowel loops, limiting accurate measurement of gallbladder ejection fraction. Some gallbladder emptying is visible. Calculated gallbladder ejection fraction is 46%. (Normal gallbladder ejection fraction with Ensure is greater than 33% and less than 80%.) IMPRESSION: 1. The cystic and common bile ducts are patent. No evidence of cholecystitis. 2. Gallbladder ejection fraction within normal limits. Electronically Signed   By: Elsie Perone M.D.   On: 11/09/2024 13:44   DG Chest Port 1 View Result Date: 11/08/2024 CLINICAL DATA:  Questionable sepsis. EXAM: PORTABLE CHEST 1 VIEW COMPARISON:  10/28/2024 and CT chest 10/01/2023. FINDINGS: Trachea is midline. Heart size normal. Lungs are clear. No pleural fluid. IMPRESSION: Negative. Electronically Signed   By: Newell Eke M.D.   On: 11/08/2024 13:56   CT Renal Stone Study Result Date: 11/08/2024 CLINICAL DATA:  Right lower quadrant pain since hospital discharge. Hematuria. EXAM: CT ABDOMEN AND PELVIS WITHOUT CONTRAST TECHNIQUE: Multidetector CT imaging of the abdomen and pelvis was performed following the standard protocol without IV contrast. RADIATION DOSE REDUCTION: This exam was performed according to the departmental dose-optimization program which includes automated exposure control, adjustment of the mA and/or kV according to patient size and/or use of iterative reconstruction technique. COMPARISON:  10/28/2024. FINDINGS: Lower  chest: Scattered subsegmental volume loss in the lung bases with dependent atelectasis in the right lower lobe and trace right pleural fluid. Heart size normal. No pericardial effusion. Distal esophagus is grossly unremarkable. Hepatobiliary: Liver is decreased in attenuation diffusely and is at  the upper limits of normal in size, 17.7 cm. Mixed density nodule containing soft tissue, fat and calcified components in the posterior right hepatic lobe measures 2.3 x 2.4 cm, characterized as a treated metastasis on MR abdomen 08/03/2024. 10 mm low-attenuation lesion in the left hepatic lobe, stable. Gallstones. No biliary ductal dilatation. Pancreas: Negative. Spleen: Negative. Adrenals/Urinary Tract: Adrenal glands and kidneys are unremarkable. Ureters and bladder are decompressed. Stomach/Bowel: Stomach, small bowel, appendix and majority of the colon are unremarkable. Low anterior resection with associated anastomosis. Vascular/Lymphatic: Circumaortic left renal vein. Atherosclerotic calcification of the aorta. No pathologically enlarged lymph nodes. Reproductive: No adnexal Mass. Other: No free fluid.  Mesenteries and peritoneum are unremarkable. Musculoskeletal: Degenerative changes in the spine. 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 IMPRESSION: 1. No acute findings to explain the patient's pain. 2. Hepatic steatosis with treated hepatic metastatic disease. 3. Trace right pleural fluid. 4. Cholelithiasis. 5. Grade 3 anterolisthesis of L5 on S1 with advanced secondary degenerative disc disease. Electronically Signed   By: Newell Eke M.D.   On: 11/08/2024 13:47      Assessment/Plan:  INTERVAL HISTORY: pan sensitive E coli on urine culture   Principal Problem:   Fever Active Problems:   Sepsis (HCC)   Hematuria   Right low back pain    Norma Colon is a 64 y.o. female with history of rectal cancer status post radiation chemotherapy remotely 20 course complicated by ureteral injury requiring surgery first years admitted and acute on chronic flank pain earlier this past month with negative urine culture after 1 dose of and had extensive workup for FUO as well as her prostate flankand hip pain and back  pain,   She is now then admitted with what seems to be an E coli tha.at is pan-sensitiv withe  #1 UTI:  switch to cefadroxil  1 g twice daily to complete 5 days of treatment   #2 Chronic R lower back flank pain not sure what else to do re this   #3 Standard universal precautions  I personally spent a total of 37 minutes in the care of the patient today including preparing to see the patient, getting/reviewing separately obtained history, performing a medically appropriate exam/evaluation, counseling and educating, placing orders, referring and communicating with other health care professionals, documenting clinical information in the EHR, independently interpreting results, and communicating results.   Evaluation of the patient requires complex antimicrobial therapy evaluation, counseling , isolation needs to reduce disease transmission and risk assessment and mitigation.   I will sign off for now.  Please call with further [email or web address]   LOS: 2 days   Jomarie Fleeta Rothman 11/10/2024, 12:41 PM  "

## 2024-12-07 ENCOUNTER — Inpatient Hospital Stay: Admitting: Oncology

## 2024-12-07 ENCOUNTER — Inpatient Hospital Stay
